# Patient Record
Sex: Male | Born: 1950
Health system: Southern US, Community
[De-identification: ages and names within clinical notes are randomized; demographics above are authoritative.]

## PROBLEM LIST (undated history)

## (undated) DIAGNOSIS — E119 Type 2 diabetes mellitus without complications: Secondary | ICD-10-CM

## (undated) DIAGNOSIS — I10 Essential (primary) hypertension: Secondary | ICD-10-CM

## (undated) DIAGNOSIS — J449 Chronic obstructive pulmonary disease, unspecified: Secondary | ICD-10-CM

## (undated) DIAGNOSIS — C3490 Malignant neoplasm of unspecified part of unspecified bronchus or lung: Secondary | ICD-10-CM

## (undated) DIAGNOSIS — F329 Major depressive disorder, single episode, unspecified: Secondary | ICD-10-CM

## (undated) DIAGNOSIS — R7881 Bacteremia: Secondary | ICD-10-CM

## (undated) DIAGNOSIS — F1010 Alcohol abuse, uncomplicated: Secondary | ICD-10-CM

## (undated) DIAGNOSIS — Z8679 Personal history of other diseases of the circulatory system: Secondary | ICD-10-CM

## (undated) DIAGNOSIS — E669 Obesity, unspecified: Secondary | ICD-10-CM

## (undated) DIAGNOSIS — N400 Enlarged prostate without lower urinary tract symptoms: Secondary | ICD-10-CM

## (undated) DIAGNOSIS — M1990 Unspecified osteoarthritis, unspecified site: Secondary | ICD-10-CM

## (undated) DIAGNOSIS — I4892 Unspecified atrial flutter: Secondary | ICD-10-CM

## (undated) DIAGNOSIS — F32A Depression, unspecified: Secondary | ICD-10-CM

## (undated) DIAGNOSIS — I509 Heart failure, unspecified: Secondary | ICD-10-CM

## (undated) DIAGNOSIS — Z87442 Personal history of urinary calculi: Secondary | ICD-10-CM

## (undated) DIAGNOSIS — I4891 Unspecified atrial fibrillation: Secondary | ICD-10-CM

## (undated) HISTORY — DX: Personal history of other diseases of the circulatory system: Z86.79

## (undated) HISTORY — DX: Type 2 diabetes mellitus without complications: E11.9

## (undated) HISTORY — DX: Malignant neoplasm of unspecified part of unspecified bronchus or lung: C34.90

## (undated) HISTORY — DX: Unspecified atrial flutter: I48.92

## (undated) HISTORY — DX: Chronic obstructive pulmonary disease, unspecified: J44.9

## (undated) HISTORY — PX: TONSILLECTOMY: SUR1361

## (undated) HISTORY — PX: OTHER SURGICAL HISTORY: SHX169

## (undated) HISTORY — DX: Essential (primary) hypertension: I10

## (undated) HISTORY — PX: APPENDECTOMY: SHX54

## (undated) HISTORY — DX: Bacteremia: R78.81

## (undated) HISTORY — DX: Alcohol abuse, uncomplicated: F10.10

---

## 2004-11-05 ENCOUNTER — Inpatient Hospital Stay (HOSPITAL_COMMUNITY): Admission: AD | Admit: 2004-11-05 | Discharge: 2004-11-06 | Payer: Self-pay | Admitting: Cardiovascular Disease

## 2004-11-05 ENCOUNTER — Ambulatory Visit: Payer: Self-pay | Admitting: Cardiovascular Disease

## 2007-08-10 ENCOUNTER — Ambulatory Visit: Payer: Self-pay | Admitting: Cardiology

## 2007-10-10 ENCOUNTER — Ambulatory Visit: Payer: Self-pay | Admitting: Cardiology

## 2008-09-09 ENCOUNTER — Ambulatory Visit (HOSPITAL_COMMUNITY): Admission: RE | Admit: 2008-09-09 | Discharge: 2008-09-09 | Payer: Self-pay | Admitting: Ophthalmology

## 2010-04-29 LAB — BASIC METABOLIC PANEL
BUN: 16 mg/dL (ref 6–23)
CO2: 26 mEq/L (ref 19–32)
Calcium: 9.4 mg/dL (ref 8.4–10.5)
Chloride: 105 mEq/L (ref 96–112)
Creatinine, Ser: 1.08 mg/dL (ref 0.4–1.5)
GFR calc Af Amer: >60 mL/min (ref >60)
GFR calc non Af Amer: >60 mL/min (ref >60)
Glucose, Bld: 113 mg/dL — ABNORMAL HIGH (ref 70–99)
Potassium: 4.5 mEq/L (ref 3.5–5.1)
Sodium: 137 mEq/L (ref 135–145)

## 2010-04-29 LAB — HEMOGLOBIN AND HEMATOCRIT, BLOOD
HCT: 48.7 % (ref 39.0–52.0)
Hemoglobin: 16.8 g/dL (ref 13.0–17.0)

## 2010-06-06 NOTE — Assessment & Plan Note (Signed)
[facility]                          [facility] OFFICE NOTE   Chase Herrera, [place]                          MRN:          [id number]  DATE:10/10/2007                            DOB:          July 11, 1950    PRIMARY CARDIOLOGIST:  Jonelle Sidle, MD   REASON FOR VISIT:  Post hospital followup.   Mr. Stroebel presents to our clinic, following recent hospitalization here  at [facility].  He was referred to [name] in consultation for evaluation of  supraventricular tachycardia.  He was initially seen by Dr. Lynden Ang, who found evidence of both atrial flutter as well as atrial  fibrillation.  His CHAD2 score was assessed as 1, secondary to  hypertension.  He was also noted to have a significant alcohol history  and, in fact, he informs me today that he stopped drinking altogether.  He was previously drinking as much as 1 pint a day.   Mr. Hamman was initially treated with adenosine, which slowed his rate  to a pattern consistent with flutter.  Dr. Marisue Humble did suggest  consideration for RF ablation of this rhythm, if he were to have any  recurrence.  However, the patient also was noted to have documented  atrial fibrillation.   Serial cardiac markers were also drawn and abnormal, with peak troponin  of 0.88.  The patient was referred for in-house evaluation with both a 2-  D echocardiogram and a stress test.  The echocardiogram showed mild LVD  (EF 45%-50%), and no significant valvular abnormalities.   The adenosine Cardiolite yielded no large reversible defects and no  significant EKG changes; EF 46%.   Clinically, Mr. Blissett reports 1 episode of tachy palpitations shortly  after discharge.  This spontaneously resolved and he has not had any  recurrent episodes.  He also states that his metoprolol was further up  titrated to the current dose of 100 mg daily.   Although he has since quit drinking, he does continue to smoke.  Of  note, Mr. Doescher  is unemployed and has no insurance.   Electrocardiogram today reveals NSR at 77 bpm with nonspecific ST  changes.   CURRENT MEDICATIONS:  1. Metoprolol 100 mg daily.  2. Diltiazem ER 240 daily.  3. Aspirin 325 daily.   PHYSICAL EXAMINATION:  VITAL SIGNS:  Blood pressure 192/100, pulse 72  and regular, weight 270.  GENERAL:  A 60 year old male, obese, sitting upright, in no distress.  HEENT:  Normocephalic and atraumatic.  NECK:  Palpable bilateral carotid pulses without bruits; unable to  assess JVD, secondary to neck girth.  LUNGS:  Diminished breath sounds in bases, with no crackles or wheezes.  HEART:  Regular rate and rhythm (S1S2), no significant murmurs.  ABDOMEN:  Markedly protuberant, nontender.  EXTREMITIES:  No significant pedal edema.  NEURO:  No focal deficit.   IMPRESSION:  1. Supraventricular tachycardia.      a.     Atrial flutter/fibrillation with spontaneous conversion to       NSR.      b.     CHAD2 score:  1.  2. Mild left ventricular dysfunction.      a.     EF 45%-50% by 2-D echo.  3. Alcohol abuse.      a.     Recently discontinued.  4. Ongoing tobacco.  5. Uncontrolled hypertension.  6. Morbid obesity.   PLAN:  1. Add lisinopril 5 mg daily for more aggressive blood pressure      control, as well as treatment for mild cardiomyopathy.  2. Followup BMET in 1 week.  3. Schedule return clinic followup with myself and Dr. Diona Browner in 6      months, particularly in light of the patient's severe financial      constraints.  He will, however, need to maintain close followup      with Dr. Fara Chute, with respect to ongoing monitoring and      management of hypertension.  4. Consider repeat 2-D echo in approximately 6 months, to see if there      has been any recovery in LV function.      Rozell Searing, PA-C  Electronically Signed      Jonelle Sidle, MD  Electronically Signed   GS/MedQ  DD: 10/10/2007  DT: 10/11/2007  Job #: [id number]   cc:    Fara Chute, MD

## 2010-06-09 NOTE — Discharge Summary (Signed)
NAME:  Chase Herrera, Chase Herrera NO.:  [id number]   MEDICAL RECORD NO.:  [id number]          PATIENT TYPE:  INP   LOCATION:  3703                         FACILITY:  [facility]   PHYSICIAN:  Charlies Constable, M.D. [facility] DATE OF BIRTH:  [birth year]   DATE OF ADMISSION:  11/05/2004  DATE OF DISCHARGE:                                 DISCHARGE SUMMARY   PRIMARY PHYSICIAN:  Dr. Neita Carp at [facility] [place].   PRINCIPAL DIAGNOSIS:  Shortness of breath and cough.   SECONDARY DIAGNOSES:  1.  Hypertension.  2.  Heavy tobacco, two packs per day.  3.  Alcohol abuse.  4.  History of bronchitis.  5.  Status post appendectomy.   ALLERGIES:  The patient has no known drug allergies.   PROCEDURES PERFORMED DURING THIS HOSPITALIZATION:  None.   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 The patient was admitted after he was chopping  wood at home and started to develop shortness of breath and cough over the  last few days with a productive cough. The patient also stated he was unable  to catch his breath so was presenting to the ED. The patient was admitted to  rule out MI and serial CK-MBs and troponins were negative. The patient also  was noted to have bronchitis during this admission and was started on a Z-  Pak. The patient did complain of continued shortness of breath and was  started on albuterol and Atrovent nebulizers, and guaifenesin for cough  suppression. The patient did not have any history during this  hospitalization of any withdrawal due to alcohol. We did encourage  moderation with his alcohol drinking.   #2 - HYPERTENSION. We increased his metoprolol and lisinopril and continued  his hydrochlorothiazide at that point. Dr. Juanda Chance saw the patient on November 06, 2004.  Assessment was most likely shortness of breath was related to  acute bronchitis and positive cigarette abuse.   DISCHARGE LABORATORY DATA:  Include a troponin x2 which are negative, CK-MBs  7.8 down to 7.1 respectively. Lipid studies showed a total cholesterol of  150, triglycerides of 153, an HDL of 52, an LDL of 67, with a  cholesterol/HDL ratio of 2.9. Thyroid studies were normal. BUN is 15,  creatinine 1.1. White blood count of 6.6, hemoglobin of 15.4, hematocrit of  44.6, platelet count of 171.   The patient will be discharged home with the following medications:  1.  Lipitor 20 mg p.o. q.h.s.  2.  Hydrochlorothiazide 25 mg p.o. daily.  3.  Lisinopril 20 mg p.o. b.i.d.  4.  Aspirin 325 mg p.o. daily.  5.  Lopressor 50 mg p.o. t.i.d.   With the addition of:  1.  Albuterol 2.5 mg two puffs q.4-6h. as needed.  2.  Atrovent inhaler 18 mcg two puffs q.4-6h. p.r.n.  3.  Azithromycin 250 mg q.h.s. x4 days.   The  patient was also instructed to eat a heart-healthy diet and smoking  cessation counseling was given. The patient is to follow up with Dr. Myrtis Ser in  the [facility] office on Monday, November 10, 2004, at 1:30 p.m. Also to follow up  with his primary care physician, Dr. Neita Carp. The patient also is to get his  hypercoagulation for lupus blood test done as an outpatient.   Duration of encounter was less than 30 minutes.     ______________________________  April Humphrey, NP    ______________________________  Charlies Constable, M.D. [facility]    AH/MEDQ  D:  11/06/2004  T:  11/06/2004  Job:  [id number]   cc:   Fara Chute  Fax: [phone]   Willa Rough, M.D.  [address]  [address]  [place]  [place] [address]

## 2010-06-09 NOTE — H&P (Signed)
NAME:  Chase Herrera, Chase Herrera NO.:  [id number]   MEDICAL RECORD NO.:  [id number]          PATIENT TYPE:  INP   LOCATION:  3703                         FACILITY:  [facility]   PHYSICIAN:  Verne Grain, MD   DATE OF BIRTH:  December 02, 1950   DATE OF ADMISSION:  11/06/2004  DATE OF DISCHARGE:                                HISTORY & PHYSICAL   PRIMARY CARDIOLOGIST:  None.   PRIMARY CARE PHYSICIAN:  Dr. Fara Chute at [facility] in [place].   CHIEF COMPLAINT:  Cough and accompanying shortness of breath with paroxysms  of cough with low-positive CK-MB, transferred from [facility] Emergency Room  for further evaluation.   HISTORY OF PRESENT ILLNESS:  [age]-year-old male with hypertension,  heavy tobacco (2 packs per day), alcohol, mild-to-moderately overweight,  works as a Press photographer, no regular exercise regimen, but does do  work in the home environment, reports chopping wood a few days ago with no  specific complaints, no chest pain, no shortness of breath; however, over  the past couple of days, the patient has had increased cough with scant  white sputum, cough paroxysms that have led to sensation of accompanying  shortness of breath, I get coughing so much I can't hardly catch my  breath.  No shortness of breath in the absence of cough.  No difficulties  with orthopnea, PND, or other positional dyspnea.  No fevers or chills.  No  chest pain at any time.  EKG at [facility] with no findings diagnostic of  ischemia.  Troponin at [facility] 0.03 (upper limit of normal at [facility]), CK  226 with MB fraction 11.4 (5%), transferred to [facility] for  further evaluation.   ALLERGIES/ADVERSE REACTIONS:  No known drug allergies.   HOME MEDICATIONS:  Lopressor, hydrochlorothiazide, lisinopril (doses  unknown).   PAST MEDICAL HISTORY:  1.  Hypertension.  2.  Heavy tobacco use (2 packs per day).  3.  Daily alcohol (half pint per day on most days, but not  every day, no      history of withdrawal or symptoms in the absence of alcohol      consumption).  4.  History of bronchitis/history of pneumonia.  5.  Status post appendectomy.   SOCIAL HISTORY:  The patient lives in [place] with his mother.  He has never  married.  He works as a Press photographer.  He has no children.  He  smokes 2 packs of cigarettes per day and a half pint of alcohol on most  days.  He has had no alcohol in the past 2 days.  He has no history of  withdrawal or withdrawal symptoms as stated above.  He has no regular  exercise regimen, although does participate in activities required in the  home as described in the HPI.   FAMILY HISTORY:  The patient's mother is alive at age 49; she is obese with  hypertension and possibly has a thrombophilia for which she takes chronic  Coumadin.  The patient personally has had no history of blood clots or any  known  blood-clotting disorder.  The patient's father is alive at age 49 with  multiple medical problems including alcoholism and heart trouble that  started in his 62s.  The patient has 4 sisters who are healthy, 1 half  brother who has an unknown medical status.   REVIEW OF SYSTEMS:  No recent fevers, chills or sweats.  No headache.  No  acute changes in auditory or visual acuity.  No acute rash.  The patient  does report some shortness of breath, but only with cough.  No dyspnea on  exertion, orthopnea or PND and specifically denied chest pain.  No bowel or  bladder complaints.  No acute neuropsychiatric complaints.  No heat or cold  intolerance.  No skin or hair changes or gastroenterologic abnormality.  All  other systems are negative.   PHYSICAL EXAMINATION:  VITAL SIGNS:  Temperature 98.4, heart rate 81,  respiratory rate 20, blood pressure 180/95, oxygen saturation 90% on 2 L,  weight 264 pounds, height 6 feet 1 inch.  GENERAL:  The patient is pleasant, cooperative, in no apparent distress.  He  answers  questions appropriately.  HEENT:  He is normocephalic, atraumatic.  Extraocular eye movements are  intact.  Oropharynx is pink and moist without lesions, somewhat suboptimal  dentition.  NECK:  Supple.  There is no jugular venous distention.  There are no carotid  bruits.  There is no palpable lymphadenopathy or thyromegaly.  CARDIOVASCULAR:  Regular S1 and S2 with no appreciable murmur.  LUNGS:  Lung fields reveals bilateral scattered rhonchi.  SKIN:  Examination reveals no acute rash.  ABDOMEN:  Soft, nontender and non-distended.  Positive bowel sounds.  Obese.  EXTREMITIES:  Examination reveal no evidence of edema.  NEUROLOGIC:  Limited neurologic exam is grossly nonfocal.  The patient is  alert and answers questions appropriately.  He moves all 4 extremities  without difficulty, although gait was not tested.   ACCESSORY CLINICAL DATA:  EKG:  Sinus rhythm at a rate of 90 with an axis  that is near normal.  P-R, QRS and QTc intervals are normal.  There is a Q  wave only in V1.  There are no clear changes diagnostic of ischemia other  than poor R wave progression.   LABORATORY VALUES:  White blood cell count 9.2, hematocrit 48, platelet  count 183,000.  Sodium 136, potassium 3.8, chloride 104, bicarb 25, BUN 17,  creatinine 1.0.  Anion gap 7.  Glucose 100, calcium 8.8.  CK 226, MB 11.4  (5%).  PTT 25, INR 1.0, troponin 0.03.  BNP 57.   IMPRESSION AND PLAN:  [age]-year-old male with suboptimally controlled  blood pressure, overweight, heavy tobacco/alcohol with signs and symptoms of  bronchitis, EKG with poor R wave progression, but otherwise no changes  diagnostic of ischemia, low-positive MB, troponin at upper limits of normal,  transferred from [facility] Room for further evaluation.   1.  Cardiac:  We will obtain serial CK-MBs and troponin, on telemetry, rule      out myocardial infarction, on heparin drip.  We will recheck an EKG in     the morning to look  for any evidence of ischemic changes.  I will      optimize the patient's respiratory treatment and blood pressure      treatment while excluding the presence of ischemia.  If all cardiac      markers are negative or in an equivocal range, it would be reasonable to  proceed with an adenosine stress Cardiolite to exclude the presence of      significant ischemia.  We will also encourage tobacco cessation      throughout the hospitalization and check a lipid profile to assure that      it is in reasonable range.  Encourage weight loss and regular exercise      after discharge.  2.  Bronchitis/tobacco:  Treat empirically with azithromycin, supplemental      oxygen, albuterol and Atrovent nebulizers, guaifenesin.  We will check a      chest x-ray, PA and lateral, to exclude any infiltrate.  Continue to      encourage tobacco cessation; will formally write for tobacco cessation      consult.  3.  Alcohol:  No history of withdrawal; however, we will monitor for      evidence of withdrawal during the hospitalization.  We will encourage      alcohol use in moderation or continuation of cessation.  4.  Hypertension:  We will try to optimize metoprolol and lisinopril doses      and continue hydrochlorothiazide in      hopes of optimizing blood pressure if failure to control blood pressure      on optimal doses of 3 medications.  To consider evaluation for secondary      causes of hypertension such as MRI/MRA to look for any evidence of renal      artery stenosis.           ______________________________  Verne Grain, MD     DDH/MEDQ  D:  11/06/2004  T:  11/06/2004  Job:  [id number]

## 2011-02-06 DIAGNOSIS — F411 Generalized anxiety disorder: Secondary | ICD-10-CM | POA: Diagnosis not present

## 2011-02-06 DIAGNOSIS — I10 Essential (primary) hypertension: Secondary | ICD-10-CM | POA: Diagnosis not present

## 2011-02-06 DIAGNOSIS — F17200 Nicotine dependence, unspecified, uncomplicated: Secondary | ICD-10-CM | POA: Diagnosis not present

## 2011-02-06 DIAGNOSIS — J438 Other emphysema: Secondary | ICD-10-CM | POA: Diagnosis not present

## 2011-02-06 DIAGNOSIS — Z0000 Encounter for general adult medical examination without abnormal findings: Secondary | ICD-10-CM | POA: Diagnosis not present

## 2011-02-06 DIAGNOSIS — E119 Type 2 diabetes mellitus without complications: Secondary | ICD-10-CM | POA: Diagnosis not present

## 2011-05-29 DIAGNOSIS — R079 Chest pain, unspecified: Secondary | ICD-10-CM | POA: Diagnosis not present

## 2011-05-29 DIAGNOSIS — I10 Essential (primary) hypertension: Secondary | ICD-10-CM | POA: Diagnosis not present

## 2011-05-29 DIAGNOSIS — I4891 Unspecified atrial fibrillation: Secondary | ICD-10-CM | POA: Diagnosis not present

## 2011-05-30 ENCOUNTER — Telehealth: Payer: Self-pay | Admitting: Physician Assistant

## 2011-05-30 DIAGNOSIS — I10 Essential (primary) hypertension: Secondary | ICD-10-CM | POA: Diagnosis not present

## 2011-05-30 DIAGNOSIS — R079 Chest pain, unspecified: Secondary | ICD-10-CM

## 2011-05-30 DIAGNOSIS — I4891 Unspecified atrial fibrillation: Secondary | ICD-10-CM

## 2011-05-30 NOTE — Telephone Encounter (Signed)
Please set up patient for a 21 day monitor for Afib/PAS per Gene.  Scheduled post hospital visit for 06/29/2011 with Gene.

## 2011-05-31 ENCOUNTER — Telehealth: Payer: Self-pay | Admitting: *Deleted

## 2011-05-31 NOTE — Telephone Encounter (Signed)
Left message for patient to call office r/e event monitor. Need to verify insurance information.

## 2011-05-31 NOTE — Telephone Encounter (Signed)
Left message for patient to call office r/e verification of insurance.

## 2011-05-31 NOTE — Telephone Encounter (Addendum)
Patient informed after walking into office and insurance verified with patient.

## 2011-05-31 NOTE — Telephone Encounter (Signed)
Called and spoke with next of kin in chart max for patient and informed her to have patient call our office.

## 2011-05-31 NOTE — Telephone Encounter (Signed)
Insurance verified and monitor ordered.

## 2011-06-05 DIAGNOSIS — I4891 Unspecified atrial fibrillation: Secondary | ICD-10-CM | POA: Diagnosis not present

## 2011-06-07 DIAGNOSIS — I10 Essential (primary) hypertension: Secondary | ICD-10-CM | POA: Diagnosis not present

## 2011-06-07 DIAGNOSIS — N400 Enlarged prostate without lower urinary tract symptoms: Secondary | ICD-10-CM | POA: Diagnosis not present

## 2011-06-07 DIAGNOSIS — E119 Type 2 diabetes mellitus without complications: Secondary | ICD-10-CM | POA: Diagnosis not present

## 2011-06-29 ENCOUNTER — Ambulatory Visit (INDEPENDENT_AMBULATORY_CARE_PROVIDER_SITE_OTHER): Payer: Medicare Other | Admitting: Physician Assistant

## 2011-06-29 ENCOUNTER — Encounter: Payer: Self-pay | Admitting: Physician Assistant

## 2011-06-29 VITALS — BP 183/92 | HR 59 | Resp 18 | Ht 73.0 in | Wt 248.0 lb

## 2011-06-29 DIAGNOSIS — R079 Chest pain, unspecified: Secondary | ICD-10-CM | POA: Diagnosis not present

## 2011-06-29 DIAGNOSIS — F1010 Alcohol abuse, uncomplicated: Secondary | ICD-10-CM

## 2011-06-29 DIAGNOSIS — E119 Type 2 diabetes mellitus without complications: Secondary | ICD-10-CM | POA: Diagnosis not present

## 2011-06-29 DIAGNOSIS — I10 Essential (primary) hypertension: Secondary | ICD-10-CM

## 2011-06-29 DIAGNOSIS — I4892 Unspecified atrial flutter: Secondary | ICD-10-CM | POA: Diagnosis not present

## 2011-06-29 MED ORDER — METOPROLOL TARTRATE 100 MG PO TABS: 100.0000 mg | ORAL_TABLET | Freq: Two times a day (BID) | ORAL | Status: DC

## 2011-06-29 MED ORDER — ASPIRIN EC 325 MG PO TBEC: 325.0000 mg | DELAYED_RELEASE_TABLET | Freq: Every day | ORAL | Status: AC

## 2011-06-29 NOTE — Assessment & Plan Note (Signed)
Followed by primary M.D. 

## 2011-06-29 NOTE — Progress Notes (Signed)
HPI: Patient presents in post hospital followup from [facility], referred to [name] in consultation for evaluation of atypical CP. He ruled out for MI, but had reported PAF RVR in the field, per EMS. On arrival to ED, however, he was in ST, wherein he remained by time of discharge.  Patient had history of PAF in 2009, and was seen here in clinic in followup, by Dr. Diona Browner. He was assessed with a CHADS score of 1 at that time (HTN), and was treated with beta blocker, calcium channel blocker, and full dose aspirin. During this admission, we assessed him with a CHADS of 2 (HTN, DM). TSH was normal. We proceeded with a dobutamine stress echocardiogram, for risk stratification, and this was normal (EF 55-60%). Of note, patient had history of mild LVD (EF 45-50%), by prior echo in 2009. He has no known CAD.  We also ordered an outpatient event monitor, and this has revealed intermittent atrial flutter with variable conduction, with rates of 110-115 bpm.  Clinically, patient essentially reports no significant associated symptoms, during his episodes of documented atrial flutter. He denies any frank tachycardia palpitations. Unfortunately, he continues to drink EtOH. He also drinks 1-2 cups of regular coffee daily.  EKG today indicates atrial flutter with 3:1 conduction at approximately 125 bpm  No Known Allergies  Current Outpatient Prescriptions  Medication Sig Dispense Refill  . albuterol (PROVENTIL HFA;VENTOLIN HFA) 108 (90 BASE) MCG/ACT inhaler Inhale 2 puffs into the lungs every 6 (six) hours as needed.      . budesonide-formoterol (SYMBICORT) 160-4.5 MCG/ACT inhaler Inhale 2 puffs into the lungs 2 (two) times daily.      . citalopram (CELEXA) 10 MG tablet Take 5 mg by mouth daily.      Marland Kitchen diltiazem (CARDIZEM) 120 MG tablet Take 120 mg by mouth 2 (two) times daily.      Marland Kitchen lisinopril (PRINIVIL,ZESTRIL) 5 MG tablet Take 5 mg by mouth daily.      . metFORMIN (GLUCOPHAGE) 500 MG tablet Take 500 mg by mouth 3  (three) times daily.      . metoprolol (LOPRESSOR) 100 MG tablet Take 1 tablet (100 mg total) by mouth 2 (two) times daily.  60 tablet  6  . DISCONTD: metoprolol (LOPRESSOR) 100 MG tablet Take 100 mg by mouth daily.      Marland Kitchen DISCONTD: metoprolol (LOPRESSOR) 100 MG tablet Take 1 tablet (100 mg total) by mouth 2 (two) times daily.  60 tablet  6  . DISCONTD: metoprolol (LOPRESSOR) 100 MG tablet Take 1 tablet (100 mg total) by mouth 2 (two) times daily.  60 tablet  6  . aspirin EC 325 MG tablet Take 1 tablet (325 mg total) by mouth daily.        Past Medical History  Diagnosis Date  . Chest pain   . Atrial flutter   . Alcohol abuse   . HTN (hypertension)   . Diabetes mellitus   . Cardiomyopathy     EF 45-50%, 2-D echo, 2009; 55-60%, by dobutamine stress echocardiogram, 5/13    No past surgical history on file.  History   Social History  . Marital Status: Single    Spouse Name: N/A    Number of Children: N/A  . Years of Education: N/A   Occupational History  . Not on file.   Social History Main Topics  . Smoking status: Current Everyday Smoker  . Smokeless tobacco: Not on file  . Alcohol Use: Not on file  . Drug Use: Not  on file  . Sexually Active: Not on file   Other Topics Concern  . Not on file   Social History Narrative  . No narrative on file    No family history on file.  ROS: no nausea, vomiting; no fever, chills; no melena, hematochezia; no claudication  PHYSICAL EXAM: BP 183/92  Pulse 59  Resp 18  Ht 6\' 1"  (1.854 m)  Wt 248 lb (112.492 kg)  BMI 32.72 kg/m2 GENERAL: 61 year old male, obese, sitting upright; NAD HEENT: NCAT, PERRLA, EOMI; sclera clear; no xanthelasma NECK: palpable bilateral carotid pulses, no bruits; no JVD; no TM LUNGS: CTA bilaterally CARDIAC: RRR (S1, S2); no significant murmurs; no rubs or gallops ABDOMEN: soft, non-tender; intact BS EXTREMETIES: intact distal pulses; no significant peripheral edema SKIN: warm/dry; no obvious  rash/lesions MUSCULOSKELETAL: no joint deformity NEURO: no focal deficit; NL affect   EKG: reviewed and available in Electronic Records   ASSESSMENT & PLAN:  Atrial flutter Treatment options of continued medical therapy versus possible RFA were discussed with the patient. For now, he wants to continue medical therapy, but will confer with his family. We also discussed the issue of continued EtOH abuse, and how this precludes him from being treated with Coumadin anticoagulation. He appears committed to try to stop. In the interim, he is to continue on full dose aspirin. We will reassess clinical status in 4-6 weeks, at which time he is to return to followup with me/Dr. Diona Browner for further recommendations. As noted, he has a slow CHADS score of 1. Will increase Lopressor to 100 twice a day for more aggressive rate, as well as possible rhythm, control.  HTN (hypertension) Increased Lopressor to 100 twice a day, as noted above. Continue current dose of diltiazem and lisinopril.  Chest pain No further workup indicated. Recent hospitalization with atypical CP, normal troponins, and a normal dobutamine stress echocardiogram. Patient has no known CAD.  Diabetes mellitus Followed by primary M.D.  ETOH abuse We discussed the importance of complete cessation, and patient appears motivated to stop. He also is aware that this precludes him from being treated with Coumadin anticoagulation, given that it places him at increased risk.     Gene Mccoy Testa, PAC

## 2011-06-29 NOTE — Assessment & Plan Note (Signed)
Treatment options of continued medical therapy versus possible RFA were discussed with the patient. For now, he wants to continue medical therapy, but will confer with his family. We also discussed the issue of continued EtOH abuse, and how this precludes him from being treated with Coumadin anticoagulation. He appears committed to try to stop. In the interim, he is to continue on full dose aspirin. We will reassess clinical status in 4-6 weeks, at which time he is to return to followup with me/Dr. Diona Browner for further recommendations. As noted, he has a slow CHADS score of 1. Will increase Lopressor to 100 twice a day for more aggressive rate, as well as possible rhythm, control.

## 2011-06-29 NOTE — Assessment & Plan Note (Signed)
We discussed the importance of complete cessation, and patient appears motivated to stop. He also is aware that this precludes him from being treated with Coumadin anticoagulation, given that it places him at increased risk.

## 2011-06-29 NOTE — Assessment & Plan Note (Signed)
No further workup indicated. Recent hospitalization with atypical CP, normal troponins, and a normal dobutamine stress echocardiogram. Patient has no known CAD.

## 2011-06-29 NOTE — Patient Instructions (Signed)
   Increase Lopressor to 100mg  twice a day    Aspirin 325mg  daily Follow up in  4-6 weeks

## 2011-06-29 NOTE — Assessment & Plan Note (Signed)
Increased Lopressor to 100 twice a day, as noted above. Continue current dose of diltiazem and lisinopril.

## 2011-07-24 DIAGNOSIS — R0602 Shortness of breath: Secondary | ICD-10-CM | POA: Diagnosis not present

## 2011-08-02 DIAGNOSIS — J438 Other emphysema: Secondary | ICD-10-CM | POA: Diagnosis not present

## 2011-08-02 DIAGNOSIS — F17200 Nicotine dependence, unspecified, uncomplicated: Secondary | ICD-10-CM | POA: Diagnosis not present

## 2011-08-02 DIAGNOSIS — F411 Generalized anxiety disorder: Secondary | ICD-10-CM | POA: Diagnosis not present

## 2011-08-02 DIAGNOSIS — I10 Essential (primary) hypertension: Secondary | ICD-10-CM | POA: Diagnosis not present

## 2011-08-02 DIAGNOSIS — E119 Type 2 diabetes mellitus without complications: Secondary | ICD-10-CM | POA: Diagnosis not present

## 2011-08-02 DIAGNOSIS — IMO0002 Reserved for concepts with insufficient information to code with codable children: Secondary | ICD-10-CM | POA: Diagnosis not present

## 2011-08-10 ENCOUNTER — Ambulatory Visit: Payer: Medicare Other | Admitting: Physician Assistant

## 2011-08-23 ENCOUNTER — Emergency Department (HOSPITAL_COMMUNITY)
Admission: EM | Admit: 2011-08-23 | Discharge: 2011-08-24 | Disposition: A | Discharge status: Rehabilitation Facility | Payer: Medicare Other | Attending: Emergency Medicine | Admitting: Emergency Medicine

## 2011-08-23 ENCOUNTER — Encounter (HOSPITAL_COMMUNITY): Payer: Self-pay | Admitting: *Deleted

## 2011-08-23 DIAGNOSIS — I428 Other cardiomyopathies: Secondary | ICD-10-CM | POA: Insufficient documentation

## 2011-08-23 DIAGNOSIS — I10 Essential (primary) hypertension: Secondary | ICD-10-CM | POA: Insufficient documentation

## 2011-08-23 DIAGNOSIS — E119 Type 2 diabetes mellitus without complications: Secondary | ICD-10-CM | POA: Insufficient documentation

## 2011-08-23 DIAGNOSIS — F1010 Alcohol abuse, uncomplicated: Secondary | ICD-10-CM

## 2011-08-23 DIAGNOSIS — Z79899 Other long term (current) drug therapy: Secondary | ICD-10-CM | POA: Insufficient documentation

## 2011-08-23 DIAGNOSIS — Z9089 Acquired absence of other organs: Secondary | ICD-10-CM | POA: Insufficient documentation

## 2011-08-23 DIAGNOSIS — F17200 Nicotine dependence, unspecified, uncomplicated: Secondary | ICD-10-CM | POA: Insufficient documentation

## 2011-08-23 LAB — RAPID URINE DRUG SCREEN, HOSP PERFORMED
Amphetamines: NOT DETECTED
Barbiturates: NOT DETECTED
Benzodiazepines: NOT DETECTED
Cocaine: NOT DETECTED
Opiates: NOT DETECTED
Tetrahydrocannabinol: NOT DETECTED

## 2011-08-23 LAB — COMPREHENSIVE METABOLIC PANEL
ALT: 43 U/L (ref 0–53)
AST: 36 U/L (ref 0–37)
Albumin: 3.7 g/dL (ref 3.5–5.2)
Alkaline Phosphatase: 56 U/L (ref 39–117)
BUN: 17 mg/dL (ref 6–23)
CO2: 25 mEq/L (ref 19–32)
Calcium: 9.4 mg/dL (ref 8.4–10.5)
Chloride: 97 mEq/L (ref 96–112)
Creatinine, Ser: 1.53 mg/dL — ABNORMAL HIGH (ref 0.50–1.35)
GFR calc Af Amer: 56 mL/min — ABNORMAL LOW (ref >90)
GFR calc non Af Amer: 48 mL/min — ABNORMAL LOW (ref >90)
Glucose, Bld: 98 mg/dL (ref 70–99)
Potassium: 3.6 mEq/L (ref 3.5–5.1)
Sodium: 135 mEq/L (ref 135–145)
Total Bilirubin: 0.2 mg/dL — ABNORMAL LOW (ref 0.3–1.2)
Total Protein: 6.8 g/dL (ref 6.0–8.3)

## 2011-08-23 LAB — CBC WITH DIFFERENTIAL/PLATELET
Basophils Absolute: 0 10*3/uL (ref 0.0–0.1)
Basophils Relative: 0 % (ref 0–1)
Eosinophils Absolute: 0.1 10*3/uL (ref 0.0–0.7)
Eosinophils Relative: 2 % (ref 0–5)
HCT: 48.6 % (ref 39.0–52.0)
Hemoglobin: 16.4 g/dL (ref 13.0–17.0)
Lymphocytes Relative: 39 % (ref 12–46)
Lymphs Abs: 2.8 10*3/uL (ref 0.7–4.0)
MCH: 35 pg — ABNORMAL HIGH (ref 26.0–34.0)
MCHC: 33.7 g/dL (ref 30.0–36.0)
MCV: 103.6 fL — ABNORMAL HIGH (ref 78.0–100.0)
Monocytes Absolute: 0.7 10*3/uL (ref 0.1–1.0)
Monocytes Relative: 9 % (ref 3–12)
Neutro Abs: 3.6 10*3/uL (ref 1.7–7.7)
Neutrophils Relative %: 50 % (ref 43–77)
Platelets: 191 10*3/uL (ref 150–400)
RBC: 4.69 MIL/uL (ref 4.22–5.81)
RDW: 13.3 % (ref 11.5–15.5)
WBC: 7.1 10*3/uL (ref 4.0–10.5)

## 2011-08-23 LAB — ETHANOL: Alcohol, Ethyl (B): 127 mg/dL — ABNORMAL HIGH (ref 0–11)

## 2011-08-23 MED ORDER — METOPROLOL TARTRATE 50 MG PO TABS
100.0000 mg | ORAL_TABLET | Freq: Two times a day (BID) | ORAL | Status: DC
  Administered 2011-08-23 – 2011-08-24 (×2): 100 mg via ORAL
  Filled 2011-08-23 (×3): qty 2

## 2011-08-23 MED ORDER — LISINOPRIL 5 MG PO TABS
5.0000 mg | ORAL_TABLET | Freq: Every day | ORAL | Status: DC
  Administered 2011-08-24: 5 mg via ORAL
  Filled 2011-08-23 (×4): qty 1

## 2011-08-23 MED ORDER — VITAMIN B-1 100 MG PO TABS
100.0000 mg | ORAL_TABLET | Freq: Every day | ORAL | Status: DC
  Administered 2011-08-23 – 2011-08-24 (×2): 100 mg via ORAL
  Filled 2011-08-23 (×2): qty 1

## 2011-08-23 MED ORDER — LORAZEPAM 2 MG/ML IJ SOLN: 1.0000 mg | Freq: Four times a day (QID) | INTRAMUSCULAR | Status: DC | PRN

## 2011-08-23 MED ORDER — BUDESONIDE-FORMOTEROL FUMARATE 160-4.5 MCG/ACT IN AERO
INHALATION_SPRAY | RESPIRATORY_TRACT | Status: AC
  Filled 2011-08-23: qty 6

## 2011-08-23 MED ORDER — ALBUTEROL SULFATE HFA 108 (90 BASE) MCG/ACT IN AERS: 2.0000 | INHALATION_SPRAY | RESPIRATORY_TRACT | Status: DC | PRN

## 2011-08-23 MED ORDER — ADULT MULTIVITAMIN W/MINERALS CH
1.0000 | ORAL_TABLET | Freq: Every day | ORAL | Status: DC
  Administered 2011-08-23 – 2011-08-24 (×2): 1 via ORAL
  Filled 2011-08-23 (×2): qty 1

## 2011-08-23 MED ORDER — DILTIAZEM HCL 30 MG PO TABS
120.0000 mg | ORAL_TABLET | Freq: Two times a day (BID) | ORAL | Status: DC
  Administered 2011-08-23 – 2011-08-24 (×2): 120 mg via ORAL
  Filled 2011-08-23: qty 3
  Filled 2011-08-23: qty 1
  Filled 2011-08-23 (×2): qty 4

## 2011-08-23 MED ORDER — LISINOPRIL 10 MG PO TABS
ORAL_TABLET | ORAL | Status: AC
  Filled 2011-08-23: qty 1

## 2011-08-23 MED ORDER — BUDESONIDE-FORMOTEROL FUMARATE 160-4.5 MCG/ACT IN AERO
2.0000 | INHALATION_SPRAY | Freq: Two times a day (BID) | RESPIRATORY_TRACT | Status: DC
  Administered 2011-08-23 – 2011-08-24 (×2): 2 via RESPIRATORY_TRACT
  Filled 2011-08-23 (×3): qty 6

## 2011-08-23 MED ORDER — LORAZEPAM 1 MG PO TABS
1.0000 mg | ORAL_TABLET | Freq: Four times a day (QID) | ORAL | Status: DC | PRN
  Administered 2011-08-23 – 2011-08-24 (×2): 1 mg via ORAL
  Filled 2011-08-23 (×2): qty 1

## 2011-08-23 MED ORDER — FOLIC ACID 1 MG PO TABS
1.0000 mg | ORAL_TABLET | Freq: Every day | ORAL | Status: DC
  Administered 2011-08-23 – 2011-08-24 (×2): 1 mg via ORAL
  Filled 2011-08-23 (×2): qty 1

## 2011-08-23 MED ORDER — CITALOPRAM HYDROBROMIDE 10 MG PO TABS
5.0000 mg | ORAL_TABLET | Freq: Every day | ORAL | Status: DC
  Administered 2011-08-23: 21:00:00 via ORAL
  Administered 2011-08-24: 5 mg via ORAL
  Filled 2011-08-23 (×4): qty 1

## 2011-08-23 MED ORDER — ALBUTEROL SULFATE HFA 108 (90 BASE) MCG/ACT IN AERS
2.0000 | INHALATION_SPRAY | RESPIRATORY_TRACT | Status: DC
  Administered 2011-08-23 – 2011-08-24 (×4): 2 via RESPIRATORY_TRACT
  Filled 2011-08-23: qty 6.7

## 2011-08-23 MED ORDER — METFORMIN HCL 500 MG PO TABS
500.0000 mg | ORAL_TABLET | Freq: Three times a day (TID) | ORAL | Status: DC
  Administered 2011-08-23 – 2011-08-24 (×2): 500 mg via ORAL
  Filled 2011-08-23 (×3): qty 1

## 2011-08-23 MED ORDER — THIAMINE HCL 100 MG/ML IJ SOLN: 100.0000 mg | Freq: Every day | INTRAMUSCULAR | Status: DC

## 2011-08-23 NOTE — ED Notes (Signed)
Chase Herrera. Of ACT team at bedside discussing plan of care with pt.

## 2011-08-23 NOTE — ED Notes (Signed)
Pt accepted at [facility] on [address]; [place], as voluntary pt  Per Dr Robet Leu as admitting MD  After 7 am 08/24/2011. Report to be called to [phone]. Pt will be transported via care link. Family to be notified by said pt.

## 2011-08-23 NOTE — ED Notes (Signed)
Requesting etoh detox, last drank etoh today.

## 2011-08-23 NOTE — BH Assessment (Signed)
Assessment Note   Chase Herrera is an 61 y.o. male.  Pt reports he is seeking detox. He reports trying to stop on his own but has been unsuccessful. He reports his mother died 1 year ago and left him lifetime rights to the house. He has 4 sisters and no other sibling drinks but him. He considers himself and the bad one in the family and wants to stop drinking. Pt denies s/i, h/i, and is not psychotic nor delusional. Pt reports family and very supportive with one sister currently at bedside. He is A & O x 3 and reports being compliant with his medications.       Axis I: Substance Abuse ALCOHOL DEPENDENCY Axis II: Deferred Axis III:  Past Medical History  Diagnosis Date  . Chest pain   . Atrial flutter   . Alcohol abuse   . HTN (hypertension)   . Diabetes mellitus   . Cardiomyopathy     EF 45-50%, 2-D echo, 2009; 55-60%, by dobutamine stress echocardiogram, 5/13   Axis IV: problems related to social environment Axis V: 51-60 moderate symptoms   Past Medical History:  Past Medical History  Diagnosis Date  . Chest pain   . Atrial flutter   . Alcohol abuse   . HTN (hypertension)   . Diabetes mellitus   . Cardiomyopathy     EF 45-50%, 2-D echo, 2009; 55-60%, by dobutamine stress echocardiogram, 5/13    Past Surgical History  Procedure Date  . Tonsillectomy   . Appendectomy     Family History: History reviewed. No pertinent family history.  Social History:  reports that he has been smoking.  He does not have any smokeless tobacco history on file. He reports that he drinks alcohol. He reports that he does not use illicit drugs.  Additional Social History:  Alcohol / Drug Use Pain Medications: na Prescriptions: na Over the Counter: na History of alcohol / drug use?: Yes Substance #1 Name of Substance 1: alcohol 1 - Age of First Use: 22 1 - Amount (size/oz): 1 pt  1 - Frequency: daily 1 - Duration: years 1 - Last Use / Amount: 08/23/11  CIWA: CIWA-Ar BP: 103/75  mmHg Pulse Rate: 108  Nausea and Vomiting: mild nausea with no vomiting Tactile Disturbances: none Tremor: not visible, but can be felt fingertip to fingertip Auditory Disturbances: not present Paroxysmal Sweats: barely perceptible sweating, palms moist Visual Disturbances: very mild sensitivity Anxiety: mildly anxious Headache, Fullness in Head: very mild Agitation: somewhat more than normal activity Orientation and Clouding of Sensorium: oriented and can do serial additions CIWA-Ar Total: 7  COWS:    Allergies: No Known Allergies  Home Medications:  (Not in a hospital admission)  OB/GYN Status:  No LMP for male patient.  General Assessment Data Location of Assessment: [facility] ACT Assessment: Yes Living Arrangements: Alone Can pt return to current living arrangement?: Yes Admission Status: Voluntary Is patient capable of signing voluntary admission?: Yes Transfer from: [facility] er) Referral Source: MD (DR Gabriel Rung ZAMMIT)  Education Status Contact person: KATHY BRIDGES-[phone]  Risk to self Suicidal Ideation: No Suicidal Intent: No Is patient at risk for suicide?: No Suicidal Plan?: No Access to Means: No What has been your use of drugs/alcohol within the last 12 months?: ALCOHOL Previous Attempts/Gestures: No How many times?: 0  Other Self Harm Risks: NA Triggers for Past Attempts: None known Intentional Self Injurious Behavior: None Family Suicide History: No Recent stressful life event(s): Loss (Comment) (MOTHER  DIED 1 YEAR AGO) Persecutory voices/beliefs?: No Depression: No Substance abuse history and/or treatment for substance abuse?: Yes Suicide prevention information given to non-admitted patients: Not applicable  Risk to Others Homicidal Ideation: No Thoughts of Harm to Others: No Current Homicidal Intent: No Current Homicidal Plan: No Access to Homicidal Means: No History of harm to others?: No Assessment of Violence: None  Noted Violent Behavior Description: NA Does patient have access to weapons?: No Criminal Charges Pending?: No Does patient have a court date: No  Psychosis Hallucinations: None noted Delusions: None noted  Mental Status Report Appear/Hygiene: Improved Eye Contact: Good Motor Activity: Freedom of movement Speech: Logical/coherent Level of Consciousness: Alert Mood: Depressed Affect: Appropriate to circumstance;Depressed Anxiety Level: Minimal Thought Processes: Coherent;Relevant Judgement: Unimpaired Orientation: Person;Place;Time;Situation Obsessive Compulsive Thoughts/Behaviors: None  Cognitive Functioning Concentration: Normal Memory: Recent Intact;Remote Intact IQ: Average Insight: Fair Impulse Control: Fair Appetite: Good Sleep: No Change Total Hours of Sleep: 8  Vegetative Symptoms: None  ADLScreening [facility] Assessment Services) Patient's cognitive ability adequate to safely complete daily activities?: Yes Patient able to express need for assistance with ADLs?: Yes Independently performs ADLs?: Yes  Abuse/Neglect [facility]) Physical Abuse: Denies Verbal Abuse: Denies Sexual Abuse: Denies  Prior Inpatient Therapy Prior Inpatient Therapy: No Prior Therapy Dates: NA Prior Therapy Facilty/Provider(s): NA Reason for Treatment: NA  Prior Outpatient Therapy Prior Outpatient Therapy: No Prior Therapy Dates: NA Prior Therapy Facilty/Provider(s): NA Reason for Treatment: NA  ADL Screening (condition at time of admission) Patient's cognitive ability adequate to safely complete daily activities?: Yes Patient able to express need for assistance with ADLs?: Yes Independently performs ADLs?: Yes Weakness of Legs: None Weakness of Arms/Hands: None  Home Assistive Devices/Equipment Home Assistive Devices/Equipment: None  Therapy Consults (therapy consults require a physician order) PT Evaluation Needed: No OT Evalulation Needed: No SLP Evaluation Needed:  No Abuse/Neglect Assessment (Assessment to be complete while patient is alone) Physical Abuse: Denies Verbal Abuse: Denies Sexual Abuse: Denies Exploitation of patient/patient's resources: Denies Self-Neglect: Denies Values / Beliefs Cultural Requests During Hospitalization: None Spiritual Requests During Hospitalization: None Consults Spiritual Care Consult Needed: No Social Work Consult Needed: No [employer]chant [employer] officer (For Healthcare) Advance Directive: Patient does not have advance directive;Patient would not like information Pre-existing out of facility DNR order (yellow form or pink MOST form): No    Additional Information 1:1 In Past 12 Months?: No CIRT Risk: No Elopement Risk: No Does patient have medical clearance?: Yes     Disposition: REFERRED TO [facility] Disposition Disposition of Patient: Inpatient treatment program Type of inpatient treatment program: Adult  On Site Evaluation by:   Reviewed with Physician  DR JOE ZAMMIT   Hattie Perch Winford 08/23/2011 9:40 PM

## 2011-08-23 NOTE — ED Provider Notes (Signed)
History     CSN: [id number]  Arrival date & time 08/23/11  1631   First MD Initiated Contact with Patient 08/23/11 1755      Chief Complaint  Patient presents with  . Medical Clearance    (Consider location/radiation/quality/duration/timing/severity/associated sxs/prior treatment) Patient is a 61 y.o. male presenting with alcohol problem. The history is provided by the patient (pt wants help for drinking). No language interpreter was used.  Alcohol Problem This is a recurrent problem. The current episode started more than 1 week ago. The problem occurs constantly. The problem has not changed since onset.Pertinent negatives include no chest pain, no abdominal pain and no headaches. Nothing aggravates the symptoms. Nothing relieves the symptoms. He has tried nothing for the symptoms. The treatment provided no relief.    Past Medical History  Diagnosis Date  . Chest pain   . Atrial flutter   . Alcohol abuse   . HTN (hypertension)   . Diabetes mellitus   . Cardiomyopathy     EF 45-50%, 2-D echo, 2009; 55-60%, by dobutamine stress echocardiogram, 5/13    Past Surgical History  Procedure Date  . Tonsillectomy   . Appendectomy     History reviewed. No pertinent family history.  History  Substance Use Topics  . Smoking status: Current Everyday Smoker  . Smokeless tobacco: Not on file  . Alcohol Use: Yes      Review of Systems  Constitutional: Negative for fatigue.  HENT: Negative for congestion, sinus pressure and ear discharge.   Eyes: Negative for discharge.  Respiratory: Negative for cough.   Cardiovascular: Negative for chest pain.  Gastrointestinal: Negative for abdominal pain and diarrhea.  Genitourinary: Negative for frequency and hematuria.  Musculoskeletal: Negative for back pain.  Skin: Negative for rash.  Neurological: Negative for seizures and headaches.  Hematological: Negative.   Psychiatric/Behavioral: Negative for hallucinations.    Allergies    Review of patient's allergies indicates no known allergies.  Home Medications   Current Outpatient Rx  Name Route Sig Dispense Refill  . ALBUTEROL SULFATE HFA 108 (90 BASE) MCG/ACT IN AERS Inhalation Inhale 2 puffs into the lungs every 6 (six) hours as needed.    . BUDESONIDE-FORMOTEROL FUMARATE 160-4.5 MCG/ACT IN AERO Inhalation Inhale 2 puffs into the lungs 2 (two) times daily.    Marland Kitchen CITALOPRAM HYDROBROMIDE 10 MG PO TABS Oral Take 5 mg by mouth daily.    Marland Kitchen DILTIAZEM HCL 120 MG PO TABS Oral Take 120 mg by mouth 2 (two) times daily.    Marland Kitchen LISINOPRIL 5 MG PO TABS Oral Take 5 mg by mouth daily.    Marland Kitchen METFORMIN HCL 500 MG PO TABS Oral Take 500 mg by mouth 3 (three) times daily.    Marland Kitchen METOPROLOL TARTRATE 100 MG PO TABS Oral Take 1 tablet (100 mg total) by mouth 2 (two) times daily. 60 tablet 6    Dose increased 06/29/2011.    BP 103/75  Pulse 108  Temp 98.6 F (37 C) (Oral)  Resp 20  Ht 6\' 1"  (1.854 m)  Wt 248 lb (112.492 kg)  BMI 32.72 kg/m2  SpO2 93%  Physical Exam  Constitutional: He is oriented to person, place, and time. He appears well-developed.  HENT:  Head: Normocephalic and atraumatic.  Eyes: Conjunctivae and EOM are normal. No scleral icterus.  Neck: Neck supple. No thyromegaly present.  Cardiovascular: Normal rate.  Exam reveals no gallop and no friction rub.   No murmur heard.      Irregular heart  beat  Pulmonary/Chest: No stridor. He has no wheezes. He has no rales. He exhibits no tenderness.  Abdominal: He exhibits no distension. There is no tenderness. There is no rebound.  Musculoskeletal: Normal range of motion. He exhibits no edema.  Lymphadenopathy:    He has no cervical adenopathy.  Neurological: He is oriented to person, place, and time. Coordination normal.  Skin: No rash noted. No erythema.  Psychiatric:       Mild depression    ED Course  Procedures (including critical care time)  Labs Reviewed  CBC WITH DIFFERENTIAL - Abnormal; Notable for the  following:    MCV 103.6 (*)     MCH 35.0 (*)     All other components within normal limits  COMPREHENSIVE METABOLIC PANEL - Abnormal; Notable for the following:    Creatinine, Ser 1.53 (*)     Total Bilirubin 0.2 (*)     GFR calc non Af Amer 48 (*)     GFR calc Af Amer 56 (*)     All other components within normal limits  ETHANOL - Abnormal; Notable for the following:    Alcohol, Ethyl (B) 127 (*)     All other components within normal limits  URINE RAPID DRUG SCREEN (HOSP PERFORMED)   No results found.   No diagnosis found.    MDM          Benny Lennert, MD 08/23/11 2052

## 2011-08-23 NOTE — ED Notes (Signed)
Pt presents with family members, states wants [facility] withdrawal treatment. Pt reports drinking a pint of vodka a day. Denies trouble with law enforcement due to drinking.  Pt reports "tired of drinking his life away". And "family is tired of him drinking daily". Pt is cooperative and pleasant at this time. Denies withdrawal symptoms at this time. No acute distress noted. States last time he drank was this morning. Smell of alcohol noted on pt's breath. No sitter required at this time as pt denies SI/HI.

## 2011-08-24 LAB — GLUCOSE, CAPILLARY
Glucose-Capillary: 155 mg/dL — ABNORMAL HIGH (ref 70–99)
Glucose-Capillary: 75 mg/dL (ref 70–99)

## 2011-08-24 NOTE — ED Notes (Signed)
BGL 155

## 2011-08-24 NOTE — ED Notes (Signed)
Report given to care [facility] RN upon call. Pt notified of en-route transport team. Pt is calm and cooperative at this time with slight hesitation of pending 28 day program and length of stay. Pt states is ready to make a change.

## 2011-08-24 NOTE — ED Notes (Signed)
Pt's HR elevated.  Pt not shakey, says does not feel like is going through withdrawals.  Notified EDP, medications given.  Ativan given.

## 2011-08-24 NOTE — ED Notes (Signed)
Dr. Bebe Shaggy aware of EKG, reports pt is asymptomatic ok to call report.  Called report to Annetta Maw RN but was told pt could not come until HR was slower.  Notified Dr. Bebe Shaggy.

## 2011-08-24 NOTE — ED Notes (Signed)
Called pharmacy for missing meds.

## 2011-08-24 NOTE — ED Provider Notes (Addendum)
Pt seen, he is awaiting placement for ETOH detox He did not take his meds at home, and he is in aflutter rate of 122 He feels well, denies any complaints, no cp/weakness reported.  No tremor noted I doubt he is actively withdrawing Home meds ordered Review of chart reveals he is not a coumadin candidate due to his ETOH use   Date: 08/24/2011  Rate: 122  Rhythm: atrial flutter  QRS Axis: normal  Intervals: normal  ST/T Wave abnormalities: nonspecific ST changes  Conduction Disutrbances:none     Joya Gaskins, MD 08/24/11 1050  Joya Gaskins, MD 08/24/11 1051

## 2011-08-24 NOTE — BH Assessment (Signed)
Assessment Note   Chase Herrera is an 61 y.o. male.   RECEIVED CALL THAT PT WAS ACCEPTED AT [facility] BY DR Chase Herrera . CALL REPORT TO [phone].  Axis I: Substance Abuse  ALCOHOL DEPENDENCY Axis II: Deferred Axis III:  Past Medical History  Diagnosis Date  . Chest pain   . Atrial flutter   . Alcohol abuse   . HTN (hypertension)   . Diabetes mellitus   . Cardiomyopathy     EF 45-50%, 2-D echo, 2009; 55-60%, by dobutamine stress echocardiogram, 5/13   Axis IV: other psychosocial or environmental problems Axis V: 51-60 moderate symptoms       Past Medical History:  Past Medical History  Diagnosis Date  . Chest pain   . Atrial flutter   . Alcohol abuse   . HTN (hypertension)   . Diabetes mellitus   . Cardiomyopathy     EF 45-50%, 2-D echo, 2009; 55-60%, by dobutamine stress echocardiogram, 5/13    Past Surgical History  Procedure Date  . Tonsillectomy   . Appendectomy     Family History: History reviewed. No pertinent family history.  Social History:  reports that he has been smoking.  He does not have any smokeless tobacco history on file. He reports that he drinks alcohol. He reports that he does not use illicit drugs.  Additional Social History:  Alcohol / Drug Use Pain Medications: na Prescriptions: na Over the Counter: na History of alcohol / drug use?: Yes Substance #1 Name of Substance 1: alcohol 1 - Age of First Use: 22 1 - Amount (size/oz): 1 pt  1 - Frequency: daily 1 - Duration: years 1 - Last Use / Amount: 08/23/11  CIWA: CIWA-Ar BP: 126/90 mmHg Pulse Rate: 119  Nausea and Vomiting: mild nausea with no vomiting Tactile Disturbances: none Tremor: no tremor Auditory Disturbances: not present Paroxysmal Sweats: no sweat visible Visual Disturbances: not present Anxiety: no anxiety, at ease Headache, Fullness in Head: none present Agitation: normal activity Orientation and Clouding of Sensorium: oriented and can do serial  additions CIWA-Ar Total: 1  COWS:    Allergies: No Known Allergies  Home Medications:  (Not in a hospital admission)  OB/GYN Status:  No LMP for male patient.  General Assessment Data Location of Assessment: [facility] ACT Assessment: Yes Living Arrangements: Alone Can pt return to current living arrangement?: Yes Admission Status: Voluntary Is patient capable of signing voluntary admission?: Yes Transfer from: [facility] er) Referral Source: MD (DR Chase Herrera)  Education Status Contact person: Chase Herrera  Risk to self Suicidal Ideation: No Suicidal Intent: No Is patient at risk for suicide?: No Suicidal Plan?: No Access to Means: No What has been your use of drugs/alcohol within the last 12 months?: ALCOHOL Previous Attempts/Gestures: No How many times?: 0  Other Self Harm Risks: NA Triggers for Past Attempts: None known Intentional Self Injurious Behavior: None Family Suicide History: No Recent stressful life event(s): Loss (Comment) (MOTHER DIED 1 YEAR AGO) Persecutory voices/beliefs?: No Depression: No Substance abuse history and/or treatment for substance abuse?: Yes Suicide prevention information given to non-admitted patients: Not applicable  Risk to Others Homicidal Ideation: No Thoughts of Harm to Others: No Current Homicidal Intent: No Current Homicidal Plan: No Access to Homicidal Means: No History of harm to others?: No Assessment of Violence: None Noted Violent Behavior Description: NA Does patient have access to weapons?: No Criminal Charges Pending?: No Does patient have a court date: No  Psychosis Hallucinations:  None noted Delusions: None noted  Mental Status Report Appear/Hygiene: Improved Eye Contact: Good Motor Activity: Freedom of movement Speech: Logical/coherent Level of Consciousness: Alert Mood: Depressed Affect: Appropriate to circumstance;Depressed Anxiety Level: Minimal Thought  Processes: Coherent;Relevant Judgement: Unimpaired Orientation: Person;Place;Time;Situation Obsessive Compulsive Thoughts/Behaviors: None  Cognitive Functioning Concentration: Normal Memory: Recent Intact;Remote Intact IQ: Average Insight: Fair Impulse Control: Fair Appetite: Good Sleep: No Change Total Hours of Sleep: 8  Vegetative Symptoms: None  ADLScreening [facility] Assessment Services) Patient's cognitive ability adequate to safely complete daily activities?: Yes Patient able to express need for assistance with ADLs?: Yes Independently performs ADLs?: Yes  Abuse/Neglect [facility]) Physical Abuse: Denies Verbal Abuse: Denies Sexual Abuse: Denies  Prior Inpatient Therapy Prior Inpatient Therapy: No Prior Therapy Dates: NA Prior Therapy Facilty/Provider(s): NA Reason for Treatment: NA  Prior Outpatient Therapy Prior Outpatient Therapy: No Prior Therapy Dates: NA Prior Therapy Facilty/Provider(s): NA Reason for Treatment: NA  ADL Screening (condition at time of admission) Patient's cognitive ability adequate to safely complete daily activities?: Yes Patient able to express need for assistance with ADLs?: Yes Independently performs ADLs?: Yes Weakness of Legs: None Weakness of Arms/Hands: None  Home Assistive Devices/Equipment Home Assistive Devices/Equipment: None  Therapy Consults (therapy consults require a physician order) PT Evaluation Needed: No OT Evalulation Needed: No SLP Evaluation Needed: No Abuse/Neglect Assessment (Assessment to be complete while patient is alone) Physical Abuse: Denies Verbal Abuse: Denies Sexual Abuse: Denies Exploitation of patient/patient's resources: Denies Self-Neglect: Denies Values / Beliefs Cultural Requests During Hospitalization: None Spiritual Requests During Hospitalization: None Consults Spiritual Care Consult Needed: No Social Work Consult Needed: No [employer]chant [employer] officer (For Healthcare) Advance Directive: Patient does  not have advance directive;Patient would not like information Pre-existing out of facility DNR order (yellow form or pink MOST form): No    Additional Information 1:1 In Past 12 Months?: No CIRT Risk: No Elopement Risk: No Does patient have medical clearance?: Yes     [facility] Referred to ([facility]) Type of inpatient treatment program: Adult  On Site Evaluation by:   Reviewed with Physician:  DR Inez Pilgrim, Chase Herrera 08/24/2011 8:51 AM

## 2011-08-24 NOTE — ED Notes (Signed)
Pt notified of pending transport. [facility] notified.

## 2011-08-31 DIAGNOSIS — R918 Other nonspecific abnormal finding of lung field: Secondary | ICD-10-CM | POA: Diagnosis not present

## 2011-08-31 DIAGNOSIS — I509 Heart failure, unspecified: Secondary | ICD-10-CM

## 2011-08-31 DIAGNOSIS — R0602 Shortness of breath: Secondary | ICD-10-CM | POA: Diagnosis not present

## 2011-09-01 DIAGNOSIS — I509 Heart failure, unspecified: Secondary | ICD-10-CM | POA: Diagnosis not present

## 2011-09-01 DIAGNOSIS — R0609 Other forms of dyspnea: Secondary | ICD-10-CM | POA: Diagnosis not present

## 2011-09-01 DIAGNOSIS — R0989 Other specified symptoms and signs involving the circulatory and respiratory systems: Secondary | ICD-10-CM | POA: Diagnosis not present

## 2011-09-02 DIAGNOSIS — R0989 Other specified symptoms and signs involving the circulatory and respiratory systems: Secondary | ICD-10-CM | POA: Diagnosis not present

## 2011-09-02 DIAGNOSIS — R0609 Other forms of dyspnea: Secondary | ICD-10-CM | POA: Diagnosis not present

## 2011-09-02 DIAGNOSIS — I509 Heart failure, unspecified: Secondary | ICD-10-CM | POA: Diagnosis not present

## 2011-09-02 DIAGNOSIS — I4892 Unspecified atrial flutter: Secondary | ICD-10-CM

## 2011-09-03 ENCOUNTER — Encounter: Payer: Self-pay | Admitting: Physician Assistant

## 2011-09-03 DIAGNOSIS — I509 Heart failure, unspecified: Secondary | ICD-10-CM | POA: Diagnosis not present

## 2011-09-03 DIAGNOSIS — R0609 Other forms of dyspnea: Secondary | ICD-10-CM | POA: Diagnosis not present

## 2011-09-04 DIAGNOSIS — R0609 Other forms of dyspnea: Secondary | ICD-10-CM | POA: Diagnosis not present

## 2011-09-04 DIAGNOSIS — I5023 Acute on chronic systolic (congestive) heart failure: Secondary | ICD-10-CM

## 2011-09-04 DIAGNOSIS — I509 Heart failure, unspecified: Secondary | ICD-10-CM | POA: Diagnosis not present

## 2011-09-05 DIAGNOSIS — O879 Venous complication in the puerperium, unspecified: Secondary | ICD-10-CM | POA: Diagnosis not present

## 2011-09-05 DIAGNOSIS — IMO0002 Reserved for concepts with insufficient information to code with codable children: Secondary | ICD-10-CM | POA: Diagnosis not present

## 2011-09-05 DIAGNOSIS — O870 Superficial thrombophlebitis in the puerperium: Secondary | ICD-10-CM | POA: Diagnosis not present

## 2011-09-06 DIAGNOSIS — E119 Type 2 diabetes mellitus without complications: Secondary | ICD-10-CM | POA: Diagnosis not present

## 2011-09-06 DIAGNOSIS — IMO0002 Reserved for concepts with insufficient information to code with codable children: Secondary | ICD-10-CM | POA: Diagnosis not present

## 2011-09-06 DIAGNOSIS — I4892 Unspecified atrial flutter: Secondary | ICD-10-CM | POA: Diagnosis not present

## 2011-09-06 DIAGNOSIS — I10 Essential (primary) hypertension: Secondary | ICD-10-CM | POA: Diagnosis not present

## 2011-09-06 DIAGNOSIS — I4891 Unspecified atrial fibrillation: Secondary | ICD-10-CM | POA: Diagnosis not present

## 2011-09-07 ENCOUNTER — Encounter: Payer: Self-pay | Admitting: Physician Assistant

## 2011-09-07 DIAGNOSIS — I10 Essential (primary) hypertension: Secondary | ICD-10-CM | POA: Diagnosis not present

## 2011-09-07 DIAGNOSIS — F17200 Nicotine dependence, unspecified, uncomplicated: Secondary | ICD-10-CM | POA: Diagnosis not present

## 2011-09-07 DIAGNOSIS — Z7901 Long term (current) use of anticoagulants: Secondary | ICD-10-CM | POA: Diagnosis not present

## 2011-09-07 DIAGNOSIS — Z79899 Other long term (current) drug therapy: Secondary | ICD-10-CM | POA: Diagnosis not present

## 2011-09-07 DIAGNOSIS — E119 Type 2 diabetes mellitus without complications: Secondary | ICD-10-CM | POA: Diagnosis not present

## 2011-09-07 DIAGNOSIS — J209 Acute bronchitis, unspecified: Secondary | ICD-10-CM | POA: Diagnosis not present

## 2011-09-07 DIAGNOSIS — R0602 Shortness of breath: Secondary | ICD-10-CM

## 2011-09-07 DIAGNOSIS — Z7982 Long term (current) use of aspirin: Secondary | ICD-10-CM | POA: Diagnosis not present

## 2011-09-07 DIAGNOSIS — J9819 Other pulmonary collapse: Secondary | ICD-10-CM | POA: Diagnosis not present

## 2011-09-07 DIAGNOSIS — J440 Chronic obstructive pulmonary disease with acute lower respiratory infection: Secondary | ICD-10-CM | POA: Diagnosis not present

## 2011-09-13 ENCOUNTER — Encounter: Payer: Self-pay | Admitting: Physician Assistant

## 2011-09-13 DIAGNOSIS — J441 Chronic obstructive pulmonary disease with (acute) exacerbation: Secondary | ICD-10-CM | POA: Diagnosis not present

## 2011-09-13 DIAGNOSIS — Z7982 Long term (current) use of aspirin: Secondary | ICD-10-CM | POA: Diagnosis not present

## 2011-09-13 DIAGNOSIS — I4891 Unspecified atrial fibrillation: Secondary | ICD-10-CM | POA: Diagnosis not present

## 2011-09-13 DIAGNOSIS — F411 Generalized anxiety disorder: Secondary | ICD-10-CM | POA: Diagnosis not present

## 2011-09-13 DIAGNOSIS — F17200 Nicotine dependence, unspecified, uncomplicated: Secondary | ICD-10-CM | POA: Diagnosis not present

## 2011-09-13 DIAGNOSIS — R0789 Other chest pain: Secondary | ICD-10-CM | POA: Diagnosis not present

## 2011-09-13 DIAGNOSIS — I4892 Unspecified atrial flutter: Secondary | ICD-10-CM | POA: Diagnosis not present

## 2011-09-13 DIAGNOSIS — Z79899 Other long term (current) drug therapy: Secondary | ICD-10-CM | POA: Diagnosis not present

## 2011-09-13 DIAGNOSIS — E119 Type 2 diabetes mellitus without complications: Secondary | ICD-10-CM | POA: Diagnosis not present

## 2011-09-13 DIAGNOSIS — R0602 Shortness of breath: Secondary | ICD-10-CM | POA: Diagnosis not present

## 2011-09-13 DIAGNOSIS — I509 Heart failure, unspecified: Secondary | ICD-10-CM | POA: Diagnosis not present

## 2011-09-13 DIAGNOSIS — I10 Essential (primary) hypertension: Secondary | ICD-10-CM | POA: Diagnosis not present

## 2011-09-14 ENCOUNTER — Other Ambulatory Visit: Payer: Self-pay | Admitting: Physician Assistant

## 2011-09-21 ENCOUNTER — Ambulatory Visit (INDEPENDENT_AMBULATORY_CARE_PROVIDER_SITE_OTHER): Payer: Medicare Other | Admitting: Physician Assistant

## 2011-09-21 ENCOUNTER — Ambulatory Visit (INDEPENDENT_AMBULATORY_CARE_PROVIDER_SITE_OTHER): Payer: Medicare Other | Admitting: *Deleted

## 2011-09-21 ENCOUNTER — Encounter: Payer: Self-pay | Admitting: Physician Assistant

## 2011-09-21 VITALS — BP 98/65 | HR 66 | Ht 73.0 in | Wt 235.0 lb

## 2011-09-21 DIAGNOSIS — Z7901 Long term (current) use of anticoagulants: Secondary | ICD-10-CM | POA: Diagnosis not present

## 2011-09-21 DIAGNOSIS — I10 Essential (primary) hypertension: Secondary | ICD-10-CM

## 2011-09-21 DIAGNOSIS — I4892 Unspecified atrial flutter: Secondary | ICD-10-CM

## 2011-09-21 DIAGNOSIS — F1010 Alcohol abuse, uncomplicated: Secondary | ICD-10-CM

## 2011-09-21 DIAGNOSIS — E119 Type 2 diabetes mellitus without complications: Secondary | ICD-10-CM

## 2011-09-21 LAB — POCT INR: INR: 1.3

## 2011-09-21 MED ORDER — WARFARIN SODIUM 5 MG PO TABS: 5.0000 mg | ORAL_TABLET | ORAL | Status: DC

## 2011-09-21 NOTE — Patient Instructions (Addendum)
Continue all current medications. Referral to EP for possible ablation Follow up after above

## 2011-09-21 NOTE — Progress Notes (Signed)
Primary Cardiologist: Simona Huh, MD   HPI: Post hospital followup from [facility], following most recent hospitalization with atrial flutter with RVR. Patient was maintained on Cardizem and Lopressor for rate control, and was initiated on Coumadin anticoagulation (CHADS2 score: 2). He has since established with our Coumadin clinic (INR 1.3 today). Also, patient reports abstinence from alcohol, of nearly one month duration now.  Clinically, he reportedly has lost approximately 35 pounds over the last month. His lower extremity edema has completely resolved. He does have some residual SOB/DOE and orthopnea. However, he also continues to smoke.  A 2-D echocardiogram was repeated, and now showed deterioration of LVF (EF 25-30%, global HK; moderate LAE) . He had a normal dobutamine stress echocardiogram in May of this year.  12-lead EKG today indicates persistent atrial flutter at approximate 70 bpm.  No Known Allergies  Current Outpatient Prescriptions  Medication Sig Dispense Refill  . albuterol (PROVENTIL HFA;VENTOLIN HFA) 108 (90 BASE) MCG/ACT inhaler Inhale 2 puffs into the lungs every 6 (six) hours as needed.      Marland Kitchen aspirin 325 MG tablet Take 325 mg by mouth every morning.       . budesonide-formoterol (SYMBICORT) 160-4.5 MCG/ACT inhaler Inhale 2 puffs into the lungs 2 (two) times daily.      . citalopram (CELEXA) 20 MG tablet Take 20 mg by mouth every evening.      . clonazePAM (KLONOPIN) 0.5 MG tablet Take 0.5 mg by mouth daily as needed.      . diltiazem (CARDIZEM CD) 240 MG 24 hr capsule Take 240 mg by mouth 2 (two) times daily.      . furosemide (LASIX) 20 MG tablet Take 20 mg by mouth 2 (two) times daily.      Marland Kitchen lisinopril (PRINIVIL,ZESTRIL) 5 MG tablet Take 5 mg by mouth daily.      . metFORMIN (GLUCOPHAGE) 500 MG tablet Take 500 mg by mouth 3 (three) times daily.      . metoprolol (LOPRESSOR) 100 MG tablet Take 1 tablet (100 mg total) by mouth 2 (two) times daily.  60 tablet  6  .  tiotropium (SPIRIVA) 18 MCG inhalation capsule Place 18 mcg into inhaler and inhale every morning.      . warfarin (COUMADIN) 5 MG tablet Take 5 mg by mouth as directed.      . zolpidem (AMBIEN) 10 MG tablet Take 10 mg by mouth at bedtime as needed.        Past Medical History  Diagnosis Date  . Chest pain   . Atrial flutter   . Alcohol abuse   . HTN (hypertension)   . Diabetes mellitus   . Cardiomyopathy     EF 45-50%, 2-D echo, 2009; 55-60%, by dobutamine stress echocardiogram, 5/13    Past Surgical History  Procedure Date  . Tonsillectomy   . Appendectomy     History   Social History  . Marital Status: Single    Spouse Name: N/A    Number of Children: N/A  . Years of Education: N/A   Occupational History  . Not on file.   Social History Main Topics  . Smoking status: Current Everyday Smoker -- 1.0 packs/day for 47 years    Types: Cigarettes  . Smokeless tobacco: Never Used  . Alcohol Use: Yes  . Drug Use: No  . Sexually Active: Not on file   Other Topics Concern  . Not on file   Social History Narrative  . No narrative on file  No family history on file.  ROS: no nausea, vomiting; no fever, chills; no melena, hematochezia; no claudication  PHYSICAL EXAM: BP 98/65  Pulse 66  Ht 6\' 1"  (1.854 m)  Wt 235 lb (106.595 kg)  BMI 31.00 kg/m2 GENERAL: 61 year old male, obese; NAD HEENT: NCAT, PERRLA, EOMI; sclera clear; no xanthelasma NECK: palpable bilateral carotid pulses, no bruits; no JVD; no TM LUNGS: Diminished breath sounds, but no crackles or wheezes CARDIAC: RRR (S1, S2); no significant murmurs; no rubs or gallops ABDOMEN: Protuberant EXTREMETIES: no significant peripheral edema SKIN: warm/dry; no obvious rash/lesions MUSCULOSKELETAL: no joint deformity NEURO: no focal deficit; NL affect   EKG: reviewed and available in Electronic Records   ASSESSMENT & PLAN:  Atrial flutter Following consultation with Dr. Andee Lineman, recommendation is as  follows: Patient will be referred to our EP team for consideration of RF ablation of his persistent typical atrial flutter. Following this procedure, we then will reassess his LVF to see if there's been any recovery. We suspect that this recent deterioration in LVF is tachycardia mediated, given that he had a normal stress test in May. If, however, he is EF remains persistently low, then plan is to proceed with a diagnostic coronary angiogram, in the near future. Patient is agreeable with this recommendation. Regarding medications, patient will be maintained on current doses of beta blocker and calcium channel blocker. He'll also continue on Coumadin anticoagulation, with recommendation to DC ASA, once he obtains therapeutic INR levels.  HTN (hypertension) Well-controlled on current regimen  Diabetes mellitus Followed by primary M.D.  ETOH abuse The importance of total abstinence was reemphasized, and patient has now been compliant with this for nearly one month.    Gene Quaran Kedzierski, PAC

## 2011-09-21 NOTE — Assessment & Plan Note (Signed)
The importance of total abstinence was reemphasized, and patient has now been compliant with this for nearly one month.

## 2011-09-21 NOTE — Assessment & Plan Note (Signed)
Followed by primary M.D. 

## 2011-09-21 NOTE — Assessment & Plan Note (Signed)
Well-controlled on current regimen. ?

## 2011-09-21 NOTE — Assessment & Plan Note (Signed)
Following consultation with Dr. Andee Lineman, recommendation is as follows: Patient will be referred to our EP team for consideration of RF ablation of his persistent typical atrial flutter. Following this procedure, we then will reassess his LVF to see if there's been any recovery. We suspect that this recent deterioration in LVF is tachycardia mediated, given that he had a normal stress test in May. If, however, he is EF remains persistently low, then plan is to proceed with a diagnostic coronary angiogram, in the near future. Patient is agreeable with this recommendation. Regarding medications, patient will be maintained on current doses of beta blocker and calcium channel blocker. He'll also continue on Coumadin anticoagulation, with recommendation to DC ASA, once he obtains therapeutic INR levels.

## 2011-09-28 ENCOUNTER — Ambulatory Visit (INDEPENDENT_AMBULATORY_CARE_PROVIDER_SITE_OTHER): Payer: Medicare Other | Admitting: *Deleted

## 2011-09-28 DIAGNOSIS — I4892 Unspecified atrial flutter: Secondary | ICD-10-CM | POA: Diagnosis not present

## 2011-09-28 DIAGNOSIS — Z7901 Long term (current) use of anticoagulants: Secondary | ICD-10-CM | POA: Diagnosis not present

## 2011-09-28 LAB — POCT INR: INR: 1.7

## 2011-10-05 ENCOUNTER — Ambulatory Visit (INDEPENDENT_AMBULATORY_CARE_PROVIDER_SITE_OTHER): Payer: Medicare Other | Admitting: *Deleted

## 2011-10-05 ENCOUNTER — Encounter: Payer: Self-pay | Admitting: *Deleted

## 2011-10-05 DIAGNOSIS — I4892 Unspecified atrial flutter: Secondary | ICD-10-CM | POA: Diagnosis not present

## 2011-10-05 DIAGNOSIS — Z7901 Long term (current) use of anticoagulants: Secondary | ICD-10-CM | POA: Diagnosis not present

## 2011-10-05 LAB — POCT INR: INR: 1.6

## 2011-10-09 ENCOUNTER — Ambulatory Visit (INDEPENDENT_AMBULATORY_CARE_PROVIDER_SITE_OTHER): Payer: Medicare Other | Admitting: *Deleted

## 2011-10-09 DIAGNOSIS — I4892 Unspecified atrial flutter: Secondary | ICD-10-CM | POA: Diagnosis not present

## 2011-10-09 DIAGNOSIS — Z7901 Long term (current) use of anticoagulants: Secondary | ICD-10-CM | POA: Diagnosis not present

## 2011-10-09 LAB — POCT INR: INR: 1.8

## 2011-10-11 DIAGNOSIS — Z23 Encounter for immunization: Secondary | ICD-10-CM | POA: Diagnosis not present

## 2011-10-12 ENCOUNTER — Ambulatory Visit (INDEPENDENT_AMBULATORY_CARE_PROVIDER_SITE_OTHER): Payer: Medicare Other | Admitting: *Deleted

## 2011-10-12 DIAGNOSIS — Z7901 Long term (current) use of anticoagulants: Secondary | ICD-10-CM | POA: Diagnosis not present

## 2011-10-12 DIAGNOSIS — I4892 Unspecified atrial flutter: Secondary | ICD-10-CM

## 2011-10-12 LAB — POCT INR: INR: 2.4

## 2011-10-15 ENCOUNTER — Ambulatory Visit (INDEPENDENT_AMBULATORY_CARE_PROVIDER_SITE_OTHER): Payer: Medicare Other | Admitting: Internal Medicine

## 2011-10-15 ENCOUNTER — Encounter: Payer: Self-pay | Admitting: *Deleted

## 2011-10-15 ENCOUNTER — Other Ambulatory Visit: Payer: Self-pay | Admitting: *Deleted

## 2011-10-15 ENCOUNTER — Encounter: Payer: Self-pay | Admitting: Internal Medicine

## 2011-10-15 VITALS — BP 118/72 | HR 80 | Ht 73.0 in | Wt 245.0 lb

## 2011-10-15 DIAGNOSIS — F17200 Nicotine dependence, unspecified, uncomplicated: Secondary | ICD-10-CM | POA: Diagnosis not present

## 2011-10-15 DIAGNOSIS — J209 Acute bronchitis, unspecified: Secondary | ICD-10-CM | POA: Diagnosis not present

## 2011-10-15 DIAGNOSIS — Z79899 Other long term (current) drug therapy: Secondary | ICD-10-CM | POA: Diagnosis not present

## 2011-10-15 DIAGNOSIS — Z7982 Long term (current) use of aspirin: Secondary | ICD-10-CM | POA: Diagnosis not present

## 2011-10-15 DIAGNOSIS — E119 Type 2 diabetes mellitus without complications: Secondary | ICD-10-CM | POA: Diagnosis not present

## 2011-10-15 DIAGNOSIS — I428 Other cardiomyopathies: Secondary | ICD-10-CM | POA: Insufficient documentation

## 2011-10-15 DIAGNOSIS — F1010 Alcohol abuse, uncomplicated: Secondary | ICD-10-CM

## 2011-10-15 DIAGNOSIS — J9819 Other pulmonary collapse: Secondary | ICD-10-CM | POA: Diagnosis not present

## 2011-10-15 DIAGNOSIS — I10 Essential (primary) hypertension: Secondary | ICD-10-CM | POA: Diagnosis not present

## 2011-10-15 DIAGNOSIS — J438 Other emphysema: Secondary | ICD-10-CM | POA: Diagnosis not present

## 2011-10-15 DIAGNOSIS — J449 Chronic obstructive pulmonary disease, unspecified: Secondary | ICD-10-CM | POA: Diagnosis not present

## 2011-10-15 DIAGNOSIS — Z720 Tobacco use: Secondary | ICD-10-CM | POA: Insufficient documentation

## 2011-10-15 DIAGNOSIS — I4892 Unspecified atrial flutter: Secondary | ICD-10-CM

## 2011-10-15 DIAGNOSIS — R0602 Shortness of breath: Secondary | ICD-10-CM

## 2011-10-15 DIAGNOSIS — Z7901 Long term (current) use of anticoagulants: Secondary | ICD-10-CM | POA: Diagnosis not present

## 2011-10-15 NOTE — Assessment & Plan Note (Signed)
Cessation is strongly advised He is not ready to quit 

## 2011-10-15 NOTE — Assessment & Plan Note (Signed)
The patient has recurrent symptomatic typical appearing atrial flutter. Therapeutic strategies for atrial flutter including medicine and ablation were discussed in detail with the patient today. Risk, benefits, and alternatives to EP study and radiofrequency ablation were also discussed in detail today. These risks include but are not limited to stroke, bleeding, vascular damage, tamponade, perforation, damage to the heart and other structures, AV block requiring pacemaker, worsening renal function, and death. The patient understands these risk and wishes to proceed.  We will therefore proceed with catheter ablation once INRs have been consistently between 2-3 for 3 weeks.

## 2011-10-15 NOTE — Assessment & Plan Note (Signed)
Possibly tachycardia mediated (atrial flutter) or due to ETOH. The importance of ETOH cessation was again discussed today.  ETOH avoidance, atrial flutter management (as above), and optimal medical therapy.  If EF remains <35% after 3-6 months post ablation then ICD could be considered.  He will continue to follow closely with Dr Diona Browner in the interim for medical management.

## 2011-10-15 NOTE — Assessment & Plan Note (Signed)
Cessation encouraged 

## 2011-10-15 NOTE — Patient Instructions (Addendum)

## 2011-10-15 NOTE — Progress Notes (Signed)
 Primary Care Physician: SASSER,Nero W, MD Referring Physician:  Dr DeGent   Chase Herrera is a 61 y.o. male with a h/o recently diagnosed CHF and atrial flutter who presents today for EP consultation.  He has a h/o heavy ETOH.  He reports that in 2009, he presented with sustained tachycardia.  I have reviewed Dr McDowell's note from 2009 which revealed that he had atrial flutter (and possibly also atrial fibrillation) at that time.  His EF was 45-50%.  Ablation was encouraged however he refused.  He did not follow-up and continued to drink heavily.  His family recently became involved and forced him into rehab 08/23/11.  While receiving therapy, he developed progressive CHF symptoms and was therefore admitted to [facility].  Echo 09/03/11 revealed EF EF 25-30% with severe LV dysfunction and LA size 46mm and no significant valvular disease. He presented with atrial flutter with RVR.  He has been treated with medical therapy, but remains in atrial flutter.  He reports feeling "fatigued" and has decreased exercise tolerance with his atrial flutter.  He has stopped drinking ETOH.   He has occasional "fluttering" but no sustained racing.  He requires 2 pillows due to orthopnea.  Today, he denies symptoms of chest pain, lower extremity edema (improved), dizziness, presyncope, syncope, or neurologic sequela. The patient is tolerating medications without difficulties and is otherwise without complaint today.   Past Medical History  Diagnosis Date  . Atrial flutter   . Alcohol abuse     quit 08/23/11  . HTN (hypertension)   . Diabetes mellitus   . Cardiomyopathy     EF 45-50%, 2-D echo, 2009; 55-60%, by dobutamine stress echocardiogram, 5/13  . COPD (chronic obstructive pulmonary disease)    Past Surgical History  Procedure Date  . Tonsillectomy   . Appendectomy     Current Outpatient Prescriptions  Medication Sig Dispense Refill  . albuterol (PROVENTIL HFA;VENTOLIN HFA) 108 (90 BASE)  MCG/ACT inhaler Inhale 2 puffs into the lungs every 6 (six) hours as needed.      . aspirin 325 MG tablet Take 325 mg by mouth every morning.       . budesonide-formoterol (SYMBICORT) 160-4.5 MCG/ACT inhaler Inhale 2 puffs into the lungs 2 (two) times daily.      . citalopram (CELEXA) 20 MG tablet Take 20 mg by mouth every evening.      . clonazePAM (KLONOPIN) 0.5 MG tablet Take 0.5 mg by mouth daily as needed.      . diltiazem (CARDIZEM CD) 240 MG 24 hr capsule Take 240 mg by mouth 2 (two) times daily.      . furosemide (LASIX) 20 MG tablet Take 20 mg by mouth daily.       . lisinopril (PRINIVIL,ZESTRIL) 5 MG tablet Take 5 mg by mouth daily.      . metFORMIN (GLUCOPHAGE) 500 MG tablet Take 500 mg by mouth 3 (three) times daily.      . metoprolol (LOPRESSOR) 100 MG tablet Take 1 tablet (100 mg total) by mouth 2 (two) times daily.  60 tablet  6  . tiotropium (SPIRIVA) 18 MCG inhalation capsule Place 18 mcg into inhaler and inhale every morning.      . warfarin (COUMADIN) 5 MG tablet Take 1 tablet (5 mg total) by mouth as directed.  60 tablet  2  . zolpidem (AMBIEN) 10 MG tablet Take 10 mg by mouth at bedtime as needed.        No Known Allergies    History   Social History  . Marital Status: Single    Spouse Name: N/A    Number of Children: N/A  . Years of Education: N/A   Occupational History  . Not on file.   Social History Main Topics  . Smoking status: Current Every Day Smoker -- 1.0 packs/day for 47 years    Types: Cigarettes  . Smokeless tobacco: Never Used   Comment: encouraged to quit today  . Alcohol Use: Yes     formerly very heavy ETOH, quit 08/23/11  . Drug Use: No  . Sexually Active: Not on file   Other Topics Concern  . 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 Diabetes    . Hypertension      ROS- All systems are  reviewed and negative except as per the HPI above  Physical Exam: Filed Vitals:   10/15/11 0913  BP: 118/72  Pulse: 80  Height: 6' 1" (1.854 m)  Weight: 245 lb (111.131 kg)    GEN- The patient is overweight appearing, alert and oriented x 3 today.   Head- normocephalic, atraumatic Eyes-  Sclera clear, conjunctiva pink Ears- hearing intact Oropharynx- clear Neck- supple, no JVP Lymph- no cervical lymphadenopathy Lungs- Clear to ausculation bilaterally, normal work of breathing Heart- irregular rate and rhythm, no murmurs, rubs or gallops, PMI not laterally displaced GI- soft, NT, ND, + BS Extremities- no clubbing, cyanosis, or edema MS- no significant deformity or atrophy Skin- no rash or lesion Psych- euthymic mood, full affect Neuro- strength and sensation are intact  EKG today reveals typical appearing atrial flutter, V rate 80 bpm, nonspecific ST/T changes  Echo and myoview reviewed   Assessment and Plan: 

## 2011-10-16 ENCOUNTER — Ambulatory Visit (INDEPENDENT_AMBULATORY_CARE_PROVIDER_SITE_OTHER): Payer: Medicare Other | Admitting: *Deleted

## 2011-10-16 DIAGNOSIS — I4892 Unspecified atrial flutter: Secondary | ICD-10-CM

## 2011-10-16 DIAGNOSIS — Z7901 Long term (current) use of anticoagulants: Secondary | ICD-10-CM

## 2011-10-16 LAB — POCT INR: INR: 2.2

## 2011-10-19 ENCOUNTER — Ambulatory Visit (INDEPENDENT_AMBULATORY_CARE_PROVIDER_SITE_OTHER): Payer: Medicare Other | Admitting: *Deleted

## 2011-10-19 DIAGNOSIS — Z7901 Long term (current) use of anticoagulants: Secondary | ICD-10-CM | POA: Diagnosis not present

## 2011-10-19 DIAGNOSIS — I4892 Unspecified atrial flutter: Secondary | ICD-10-CM

## 2011-10-19 LAB — POCT INR: INR: 2.4

## 2011-10-23 ENCOUNTER — Ambulatory Visit (INDEPENDENT_AMBULATORY_CARE_PROVIDER_SITE_OTHER): Payer: Medicare Other | Admitting: *Deleted

## 2011-10-23 DIAGNOSIS — Z7901 Long term (current) use of anticoagulants: Secondary | ICD-10-CM | POA: Diagnosis not present

## 2011-10-23 DIAGNOSIS — I4892 Unspecified atrial flutter: Secondary | ICD-10-CM

## 2011-10-23 DIAGNOSIS — M2550 Pain in unspecified joint: Secondary | ICD-10-CM | POA: Diagnosis not present

## 2011-10-23 LAB — POCT INR: INR: 2.6

## 2011-10-30 ENCOUNTER — Ambulatory Visit (INDEPENDENT_AMBULATORY_CARE_PROVIDER_SITE_OTHER): Payer: Medicare Other | Admitting: *Deleted

## 2011-10-30 ENCOUNTER — Encounter: Payer: Self-pay | Admitting: Internal Medicine

## 2011-10-30 DIAGNOSIS — Z7901 Long term (current) use of anticoagulants: Secondary | ICD-10-CM | POA: Diagnosis not present

## 2011-10-30 DIAGNOSIS — I4892 Unspecified atrial flutter: Secondary | ICD-10-CM | POA: Diagnosis not present

## 2011-10-30 DIAGNOSIS — I4891 Unspecified atrial fibrillation: Secondary | ICD-10-CM | POA: Diagnosis not present

## 2011-10-30 LAB — POCT INR: INR: 2.5

## 2011-10-31 ENCOUNTER — Encounter (HOSPITAL_COMMUNITY): Payer: Self-pay | Admitting: Pharmacy Technician

## 2011-11-06 ENCOUNTER — Encounter (HOSPITAL_COMMUNITY): Payer: Self-pay | Admitting: Anesthesiology

## 2011-11-06 ENCOUNTER — Encounter (HOSPITAL_COMMUNITY)
Admission: RE | Disposition: A | Discharge status: Home / Self Care | Payer: Self-pay | Source: Ambulatory Visit | Attending: Internal Medicine

## 2011-11-06 ENCOUNTER — Ambulatory Visit (HOSPITAL_COMMUNITY)
Admission: RE | Admit: 2011-11-06 | Discharge: 2011-11-07 | Disposition: A | Discharge status: Home / Self Care | Payer: Medicare Other | Source: Ambulatory Visit | Attending: Internal Medicine | Admitting: Internal Medicine

## 2011-11-06 ENCOUNTER — Ambulatory Visit (HOSPITAL_COMMUNITY): Payer: Medicare Other | Admitting: Anesthesiology

## 2011-11-06 DIAGNOSIS — I428 Other cardiomyopathies: Secondary | ICD-10-CM | POA: Diagnosis not present

## 2011-11-06 DIAGNOSIS — J449 Chronic obstructive pulmonary disease, unspecified: Secondary | ICD-10-CM | POA: Insufficient documentation

## 2011-11-06 DIAGNOSIS — I10 Essential (primary) hypertension: Secondary | ICD-10-CM | POA: Insufficient documentation

## 2011-11-06 DIAGNOSIS — I471 Supraventricular tachycardia, unspecified: Secondary | ICD-10-CM | POA: Insufficient documentation

## 2011-11-06 DIAGNOSIS — E119 Type 2 diabetes mellitus without complications: Secondary | ICD-10-CM | POA: Insufficient documentation

## 2011-11-06 DIAGNOSIS — Z7901 Long term (current) use of anticoagulants: Secondary | ICD-10-CM | POA: Insufficient documentation

## 2011-11-06 DIAGNOSIS — F17200 Nicotine dependence, unspecified, uncomplicated: Secondary | ICD-10-CM | POA: Insufficient documentation

## 2011-11-06 DIAGNOSIS — I4892 Unspecified atrial flutter: Secondary | ICD-10-CM

## 2011-11-06 DIAGNOSIS — M1990 Unspecified osteoarthritis, unspecified site: Secondary | ICD-10-CM | POA: Diagnosis not present

## 2011-11-06 DIAGNOSIS — Z79899 Other long term (current) drug therapy: Secondary | ICD-10-CM | POA: Diagnosis not present

## 2011-11-06 DIAGNOSIS — J4489 Other specified chronic obstructive pulmonary disease: Secondary | ICD-10-CM | POA: Insufficient documentation

## 2011-11-06 HISTORY — DX: Major depressive disorder, single episode, unspecified: F32.9

## 2011-11-06 HISTORY — DX: Unspecified osteoarthritis, unspecified site: M19.90

## 2011-11-06 HISTORY — PX: ATRIAL ABLATION SURGERY: SHX560

## 2011-11-06 HISTORY — PX: ATRIAL FLUTTER ABLATION: SHX5733

## 2011-11-06 HISTORY — DX: Depression, unspecified: F32.A

## 2011-11-06 LAB — CBC
HCT: 47 % (ref 39.0–52.0)
Hemoglobin: 16 g/dL (ref 13.0–17.0)
MCH: 33.4 pg (ref 26.0–34.0)
MCHC: 34 g/dL (ref 30.0–36.0)
MCV: 98.1 fL (ref 78.0–100.0)
Platelets: 180 10*3/uL (ref 150–400)
RBC: 4.79 MIL/uL (ref 4.22–5.81)
RDW: 13.6 % (ref 11.5–15.5)
WBC: 8.9 10*3/uL (ref 4.0–10.5)

## 2011-11-06 LAB — PROTIME-INR
INR: 2.03 — ABNORMAL HIGH (ref 0.00–1.49)
Prothrombin Time: 22.1 seconds — ABNORMAL HIGH (ref 11.6–15.2)

## 2011-11-06 LAB — GLUCOSE, CAPILLARY
Glucose-Capillary: 132 mg/dL — ABNORMAL HIGH (ref 70–99)
Glucose-Capillary: 89 mg/dL (ref 70–99)
Glucose-Capillary: 207 mg/dL — ABNORMAL HIGH (ref 70–99)
Glucose-Capillary: 94 mg/dL (ref 70–99)

## 2011-11-06 SURGERY — ATRIAL FLUTTER ABLATION
Anesthesia: Monitor Anesthesia Care

## 2011-11-06 MED ORDER — MEPERIDINE HCL 25 MG/ML IJ SOLN: 6.2500 mg | INTRAMUSCULAR | Status: DC | PRN

## 2011-11-06 MED ORDER — HYDROCODONE-ACETAMINOPHEN 5-325 MG PO TABS: 1.0000 | ORAL_TABLET | ORAL | Status: DC | PRN

## 2011-11-06 MED ORDER — PHENYLEPHRINE HCL 10 MG/ML IJ SOLN
INTRAMUSCULAR | Status: DC | PRN
  Administered 2011-11-06: 40 ug via INTRAVENOUS
  Administered 2011-11-06 (×3): 120 ug via INTRAVENOUS

## 2011-11-06 MED ORDER — SODIUM CHLORIDE 0.9 % IV SOLN: 250.0000 mL | INTRAVENOUS | Status: DC | PRN

## 2011-11-06 MED ORDER — ALBUTEROL SULFATE (5 MG/ML) 0.5% IN NEBU
2.5000 mg | INHALATION_SOLUTION | Freq: Once | RESPIRATORY_TRACT | Status: AC
  Administered 2011-11-06: 2.5 mg via RESPIRATORY_TRACT

## 2011-11-06 MED ORDER — OXYCODONE HCL 5 MG/5ML PO SOLN: 5.0000 mg | Freq: Once | ORAL | Status: AC | PRN

## 2011-11-06 MED ORDER — ARTIFICIAL TEARS OP OINT
TOPICAL_OINTMENT | OPHTHALMIC | Status: DC | PRN
  Administered 2011-11-06: 1 via OPHTHALMIC

## 2011-11-06 MED ORDER — WARFARIN - PHARMACIST DOSING INPATIENT
Freq: Every day | Status: DC
  Administered 2011-11-06: 18:00:00

## 2011-11-06 MED ORDER — BUPIVACAINE HCL (PF) 0.25 % IJ SOLN
INTRAMUSCULAR | Status: AC
  Filled 2011-11-06: qty 30

## 2011-11-06 MED ORDER — SODIUM CHLORIDE 0.9 % IV SOLN
INTRAVENOUS | Status: DC | PRN
  Administered 2011-11-06: 13:00:00 via INTRAVENOUS

## 2011-11-06 MED ORDER — ZOLPIDEM TARTRATE 5 MG PO TABS
10.0000 mg | ORAL_TABLET | Freq: Every evening | ORAL | Status: DC | PRN
  Administered 2011-11-06: 21:00:00 10 mg via ORAL
  Filled 2011-11-06: qty 2

## 2011-11-06 MED ORDER — SODIUM CHLORIDE 0.9 % IJ SOLN: 3.0000 mL | INTRAMUSCULAR | Status: DC | PRN

## 2011-11-06 MED ORDER — HYDROMORPHONE HCL PF 1 MG/ML IJ SOLN: 0.2500 mg | INTRAMUSCULAR | Status: DC | PRN

## 2011-11-06 MED ORDER — ONDANSETRON HCL 4 MG/2ML IJ SOLN: 4.0000 mg | Freq: Four times a day (QID) | INTRAMUSCULAR | Status: DC | PRN

## 2011-11-06 MED ORDER — WARFARIN SODIUM 10 MG PO TABS
10.0000 mg | ORAL_TABLET | Freq: Once | ORAL | Status: AC
  Administered 2011-11-06: 18:00:00 10 mg via ORAL
  Filled 2011-11-06: qty 1

## 2011-11-06 MED ORDER — OXYCODONE HCL 5 MG PO TABS: 5.0000 mg | ORAL_TABLET | Freq: Once | ORAL | Status: AC | PRN

## 2011-11-06 MED ORDER — WARFARIN SODIUM 10 MG PO TABS
10.0000 mg | ORAL_TABLET | Freq: Every day | ORAL | Status: DC
  Filled 2011-11-06 (×2): qty 1

## 2011-11-06 MED ORDER — ACETAMINOPHEN 325 MG PO TABS: 650.0000 mg | ORAL_TABLET | ORAL | Status: DC | PRN

## 2011-11-06 MED ORDER — ONDANSETRON HCL 4 MG/2ML IJ SOLN: 4.0000 mg | Freq: Once | INTRAMUSCULAR | Status: AC | PRN

## 2011-11-06 MED ORDER — LIDOCAINE HCL (CARDIAC) 20 MG/ML IV SOLN
INTRAVENOUS | Status: DC | PRN
  Administered 2011-11-06: 100 mg via INTRAVENOUS

## 2011-11-06 MED ORDER — PROPOFOL 10 MG/ML IV BOLUS
INTRAVENOUS | Status: DC | PRN
  Administered 2011-11-06: 130 mg via INTRAVENOUS

## 2011-11-06 MED ORDER — CITALOPRAM HYDROBROMIDE 20 MG PO TABS
20.0000 mg | ORAL_TABLET | Freq: Every evening | ORAL | Status: DC
  Administered 2011-11-06: 20 mg via ORAL
  Filled 2011-11-06 (×2): qty 1

## 2011-11-06 MED ORDER — MIDAZOLAM HCL 5 MG/5ML IJ SOLN
INTRAMUSCULAR | Status: DC | PRN
  Administered 2011-11-06 (×2): 1 mg via INTRAVENOUS

## 2011-11-06 MED ORDER — LISINOPRIL 5 MG PO TABS
5.0000 mg | ORAL_TABLET | Freq: Every day | ORAL | Status: DC
  Administered 2011-11-06 – 2011-11-07 (×2): 5 mg via ORAL
  Filled 2011-11-06 (×2): qty 1

## 2011-11-06 MED ORDER — SODIUM CHLORIDE 0.9 % IJ SOLN
3.0000 mL | Freq: Two times a day (BID) | INTRAMUSCULAR | Status: DC
  Administered 2011-11-06 – 2011-11-07 (×2): 3 mL via INTRAVENOUS

## 2011-11-06 MED ORDER — METFORMIN HCL 500 MG PO TABS
500.0000 mg | ORAL_TABLET | Freq: Three times a day (TID) | ORAL | Status: DC
  Administered 2011-11-06 – 2011-11-07 (×2): 500 mg via ORAL
  Filled 2011-11-06 (×5): qty 1

## 2011-11-06 MED ORDER — ALBUTEROL SULFATE HFA 108 (90 BASE) MCG/ACT IN AERS
2.0000 | INHALATION_SPRAY | Freq: Four times a day (QID) | RESPIRATORY_TRACT | Status: DC | PRN
  Administered 2011-11-07: 02:00:00 2 via RESPIRATORY_TRACT
  Filled 2011-11-06: qty 6.7

## 2011-11-06 MED ORDER — ALBUTEROL SULFATE HFA 108 (90 BASE) MCG/ACT IN AERS
2.0000 | INHALATION_SPRAY | Freq: Four times a day (QID) | RESPIRATORY_TRACT | Status: DC
  Administered 2011-11-06: 2 via RESPIRATORY_TRACT
  Filled 2011-11-06: qty 6.7

## 2011-11-06 MED ORDER — FUROSEMIDE 20 MG PO TABS
20.0000 mg | ORAL_TABLET | Freq: Every day | ORAL | Status: DC
  Administered 2011-11-06 – 2011-11-07 (×2): 20 mg via ORAL
  Filled 2011-11-06 (×2): qty 1

## 2011-11-06 MED ORDER — FENTANYL CITRATE 0.05 MG/ML IJ SOLN
INTRAMUSCULAR | Status: DC | PRN
  Administered 2011-11-06 (×2): 50 ug via INTRAVENOUS

## 2011-11-06 NOTE — H&P (View-Only) (Signed)
Primary Care Physician: Estanislado Pandy, MD Referring Physician:  Dr Norwood Levo is a 61 y.o. male with a h/o recently diagnosed CHF and atrial flutter who presents today for EP consultation.  He has a h/o heavy ETOH.  He reports that in 2009, he presented with sustained tachycardia.  I have reviewed Dr McDowell's note from 2009 which revealed that he had atrial flutter (and possibly also atrial fibrillation) at that time.  His EF was 45-50%.  Ablation was encouraged however he refused.  He did not follow-up and continued to drink heavily.  His family recently became involved and forced him into rehab 08/23/11.  While receiving therapy, he developed progressive CHF symptoms and was therefore admitted to [facility].  Echo 09/03/11 revealed EF EF 25-30% with severe LV dysfunction and LA size 46mm and no significant valvular disease. He presented with atrial flutter with RVR.  He has been treated with medical therapy, but remains in atrial flutter.  He reports feeling "fatigued" and has decreased exercise tolerance with his atrial flutter.  He has stopped drinking ETOH.   He has occasional "fluttering" but no sustained racing.  He requires 2 pillows due to orthopnea.  Today, he denies symptoms of chest pain, lower extremity edema (improved), dizziness, presyncope, syncope, or neurologic sequela. The patient is tolerating medications without difficulties and is otherwise without complaint today.   Past Medical History  Diagnosis Date  . Atrial flutter   . Alcohol abuse     quit 08/23/11  . HTN (hypertension)   . Diabetes mellitus   . Cardiomyopathy     EF 45-50%, 2-D echo, 2009; 55-60%, by dobutamine stress echocardiogram, 5/13  . COPD (chronic obstructive pulmonary disease)    Past Surgical History  Procedure Date  . Tonsillectomy   . Appendectomy     Current Outpatient Prescriptions  Medication Sig Dispense Refill  . albuterol (PROVENTIL HFA;VENTOLIN HFA) 108 (90 BASE)  MCG/ACT inhaler Inhale 2 puffs into the lungs every 6 (six) hours as needed.      Marland Kitchen aspirin 325 MG tablet Take 325 mg by mouth every morning.       . budesonide-formoterol (SYMBICORT) 160-4.5 MCG/ACT inhaler Inhale 2 puffs into the lungs 2 (two) times daily.      . citalopram (CELEXA) 20 MG tablet Take 20 mg by mouth every evening.      . clonazePAM (KLONOPIN) 0.5 MG tablet Take 0.5 mg by mouth daily as needed.      . diltiazem (CARDIZEM CD) 240 MG 24 hr capsule Take 240 mg by mouth 2 (two) times daily.      . furosemide (LASIX) 20 MG tablet Take 20 mg by mouth daily.       Marland Kitchen lisinopril (PRINIVIL,ZESTRIL) 5 MG tablet Take 5 mg by mouth daily.      . metFORMIN (GLUCOPHAGE) 500 MG tablet Take 500 mg by mouth 3 (three) times daily.      . metoprolol (LOPRESSOR) 100 MG tablet Take 1 tablet (100 mg total) by mouth 2 (two) times daily.  60 tablet  6  . tiotropium (SPIRIVA) 18 MCG inhalation capsule Place 18 mcg into inhaler and inhale every morning.      . warfarin (COUMADIN) 5 MG tablet Take 1 tablet (5 mg total) by mouth as directed.  60 tablet  2  . zolpidem (AMBIEN) 10 MG tablet Take 10 mg by mouth at bedtime as needed.        No Known Allergies  History   Social History  . Marital Status: Single    Spouse Name: N/A    Number of Children: N/A  . Years of Education: N/A   Occupational History  . Not on file.   Social History Main Topics  . Smoking status: Current Every Day Smoker -- 1.0 packs/day for 47 years    Types: Cigarettes  . Smokeless tobacco: Never Used   Comment: encouraged to quit today  . Alcohol Use: Yes     formerly very heavy ETOH, quit 08/23/11  . Drug Use: No  . Sexually Active: Not on file   Other Topics Concern  . 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 Diabetes    . Hypertension      ROS- All systems are  reviewed and negative except as per the HPI above  Physical Exam: Filed Vitals:   10/15/11 0913  BP: 118/72  Pulse: 80  Height: 6\' 1"  (1.854 m)  Weight: 245 lb (111.131 kg)    GEN- The patient is overweight appearing, alert and oriented x 3 today.   Head- normocephalic, atraumatic Eyes-  Sclera clear, conjunctiva pink Ears- hearing intact Oropharynx- clear Neck- supple, no JVP Lymph- no cervical lymphadenopathy Lungs- Clear to ausculation bilaterally, normal work of breathing Heart- irregular rate and rhythm, no murmurs, rubs or gallops, PMI not laterally displaced GI- soft, NT, ND, + BS Extremities- no clubbing, cyanosis, or edema MS- no significant deformity or atrophy Skin- no rash or lesion Psych- euthymic mood, full affect Neuro- strength and sensation are intact  EKG today reveals typical appearing atrial flutter, V rate 80 bpm, nonspecific ST/T changes  Echo and myoview reviewed   Assessment and Plan:

## 2011-11-06 NOTE — Preoperative (Addendum)
Beta Blockers   Metoprolol 100 mgs taken @  6am     On 11/06/11

## 2011-11-06 NOTE — Op Note (Signed)
PREPROCEDURE DIAGNOSIS: Typical-appearing atrial flutter.   POSTPROCEDURE DIAGNOSIS: Isthmus-dependent right atrial flutter.   PROCEDURES:  1. Comprehensive EP study.  2. Coronary sinus pacing and recording.  3. Mapping of SVT.  4. Ablation of SVT.   INTRODUCTION:  Chase Herrera is a 61 y.o. male with a history of symptomatic typical-appearing atrial flutter. He presents today for EP study and radiofrequency ablation.   DESCRIPTION OF THE PROCEDURE: Informed written consent was obtained, and the patient was brought to the electrophysiology lab in the fasting state. The patient was then adequately sedated with anesthesia as outlined in the anesthesia report. The patient's right groin was prepped and draped in the usual sterile fashion by the EP lab staff. Using a percutaneous Seldinger technique a 6, 7, and 8-French  hemostasis sheaths were placed into the right common femoral vein. A 7- [employer] decapolar coronary sinus catheter was introduced through the right common femoral vein and advanced into the coronary sinus for recording and pacing from this location. A 6-French quadripolar Josephson catheter was introduced through the right common femoral vein and advanced into the right ventricle for recording and pacing. This catheter was then pulled back to the His bundle location. The patient presented to the electrophysiology lab in atrial flutter. The surface electrocardiogram was consistent with typical atrial flutter. The coronary sinus activation sequence was proximal to distal and suggestive of right atrial flutter.  The atrial flutter cycle length was 260 msec. Atrial entrainment mapping was then performed. When pacing from the left atrium, a long post pacing interval was observed. With entrainment mapping from the cavotricuspid isthmus, the post pacing interval was equal to the tachycardia cycle length and therefore suggestive of isthmus-dependent right atrial flutter. The patient's  QRS duration measured 102 msec with an RR interval of 1066 msec and an HV interval of 41 msec. I elected to perform cavotricuspid isthmus ablation.   A [employer] 7-French  10mm ablation catheter was introduced through the right common femoral vein and advanced into the right atrium. Mapping of the cavotricuspid isthmus  was performed which revealed a standard isthmus. A series of seven radiofrequency applications were delivered along the cavotricuspid isthmus with a target temperature of 60 degrees with power of 70 watts. During the 4th radiofrequency ablation lesion, the tachycardia slowed and then terminated. The patient remained in sinus rhythm thereafter. An additional 3 radiofrequency applications were then delivered with similar parameters in order to obtain complete bidirectional cavotricuspid isthmus block.   Following ablation, differential atrial pacing was performed from the low lateral right atrium with a Duodecapolar halo catheter in place. This confirmed complete bidirectional cavotricuspid isthmus  block with a stimulus to earliest atrial activation recorded bidirectional across the isthmus measuring 170 msec. The patient was observed for 20 minutes without return of conduction through the  isthmus.  Following ablation, the AH interval measured 131 msec with an HV interval of 52 msec. Rapid atrial pacing was performed, which revealed an AV Wenckebach cycle length of 400 msec with no evidence of  PR greater than RR and no tachycardias induced when pacing down to a cycle length of 250 msec. Ventricular pacing was then performed which revealed VA dissociation when pacing at a cycle length of 600 msec.  The procedure was therefore considered completed. All catheters were removed, and the sheaths were aspirated and flushed. The sheaths were removed and hemostasis was assured. There were no early apparent complications.   CONCLUSIONS:  1. Isthmus-dependent right atrial flutter upon  presentation, successfully ablated along the usual cavotricuspid isthmus.  2. Complete bidirectional cavotricuspid isthmus block achieved.  3. No inducible arrhythmias following ablation.  4. No early apparent complications.  Fayrene Fearing Noelly Lasseigne,MD 11/06/2011 2:29 PM

## 2011-11-06 NOTE — Anesthesia Procedure Notes (Signed)
Procedure Name: LMA Insertion Date/Time: 11/06/2011 1:05 PM Performed by: Tyrone Nine Pre-anesthesia Checklist: Suction available, Timeout performed, Emergency Drugs available, Patient identified and Patient being monitored Patient Re-evaluated:Patient Re-evaluated prior to inductionOxygen Delivery Method: Circle system utilized Preoxygenation: Pre-oxygenation with 100% oxygen Intubation Type: IV induction Ventilation: Mask ventilation without difficulty LMA: LMA with gastric port inserted LMA Size: 5.0 Number of attempts: 1 Placement Confirmation: positive ETCO2 Tube secured with: Tape Dental Injury: Teeth and Oropharynx as per pre-operative assessment

## 2011-11-06 NOTE — Transfer of Care (Signed)
Immediate Anesthesia Transfer of Care Note  Patient: Chase Herrera  Procedure(s) Performed: Procedure(s) (LRB) with comments: ATRIAL FLUTTER ABLATION (N/A)  Patient Location: PACU  Anesthesia Type: General  Level of Consciousness: awake, alert , oriented and patient cooperative  Airway & Oxygen Therapy: Patient Spontanous Breathing and Patient connected to nasal cannula oxygen  Post-op Assessment: Report given to PACU RN and Post -op Vital signs reviewed and stable  Post vital signs: Reviewed and stable  Complications: No apparent anesthesia complications

## 2011-11-06 NOTE — Anesthesia Preprocedure Evaluation (Addendum)
Anesthesia Evaluation  Patient identified by MRN, date of birth, ID band Patient awake    Reviewed: Allergy & Precautions, H&P , NPO status , Patient's Chart, lab work & pertinent test results, reviewed documented beta blocker date and time   Airway Mallampati: II TM Distance: >3 FB     Dental  (+) Missing, Loose and Dental Advisory Given   Pulmonary COPD COPD inhaler,          Cardiovascular hypertension, Pt. on medications and Pt. on home beta blockers + DOE + dysrhythmias Atrial Fibrillation Rhythm:Irregular Rate:Normal     Neuro/Psych    GI/Hepatic Neg liver ROS,   Endo/Other  diabetes, Poorly Controlled, Type 2, Oral Hypoglycemic Agents  Renal/GU negative Renal ROS     Musculoskeletal  (+) Arthritis -, Osteoarthritis,    Abdominal   Peds  Hematology   Anesthesia Other Findings Pt with multiple loose carious teeth  Reproductive/Obstetrics                         Anesthesia Physical Anesthesia Plan  ASA: III  Anesthesia Plan: General   Post-op Pain Management:    Induction: Intravenous  Airway Management Planned: LMA  Additional Equipment:   Intra-op Plan:   Post-operative Plan: Extubation in OR  Informed Consent: I have reviewed the patients History and Physical, chart, labs and discussed the procedure including the risks, benefits and alternatives for the proposed anesthesia with the patient or authorized representative who has indicated his/her understanding and acceptance.   Dental advisory given  Plan Discussed with: CRNA and Anesthesiologist  Anesthesia Plan Comments:         Anesthesia Quick Evaluation

## 2011-11-06 NOTE — Anesthesia Postprocedure Evaluation (Signed)
Anesthesia Post Note  Patient: Chase Herrera  Procedure(s) Performed: Procedure(s) (LRB): ATRIAL FLUTTER ABLATION (N/A)  Anesthesia type: general  Patient location: PACU  Post pain: Pain level controlled  Post assessment: Patient's Cardiovascular Status Stable  Last Vitals:  Filed Vitals:   11/06/11 0814  BP: 117/80  Pulse: 92  Temp: 36.1 C  Resp: 20    Post vital signs: Reviewed and stable  Level of consciousness: sedated  Complications: No apparent anesthesia complications

## 2011-11-06 NOTE — Interval H&P Note (Signed)
History and Physical Interval Note:  11/06/2011 12:56 PM  Chase Herrera  has presented today for surgery, with the diagnosis of aflutter  The various methods of treatment have been discussed with the patient and family. After consideration of risks, benefits and other options for treatment, the patient has consented to  Procedure(s) (LRB) with comments: ATRIAL FLUTTER ABLATION (N/A) as a surgical intervention .  The patient's history has been reviewed, patient examined, no change in status, stable for surgery.  I have reviewed the patient's chart and labs.  Questions were answered to the patient's satisfaction.     Hillis Range

## 2011-11-06 NOTE — Progress Notes (Signed)
ANTICOAGULATION CONSULT NOTE - Initial Consult  Pharmacy Consult for Coumadin Indication: A. Flutter  No Known Allergies  Patient Measurements: Height: 6\' 1"  (185.4 cm) Weight: 245 lb (111.131 kg) IBW/kg (Calculated) : 79.9   Vital Signs: Temp: 97.5 F (36.4 C) (10/15 1614) Temp src: Oral (10/15 1614) BP: 121/69 mmHg (10/15 1614) Pulse Rate: 73  (10/15 1614)  Labs:  [facility] 11/06/11 0823  HGB 16.0  HCT 47.0  PLT 180  APTT --  LABPROT 22.1*  INR 2.03*  HEPARINUNFRC --  CREATININE --  CKTOTAL --  CKMB --  TROPONINI --    Estimated Creatinine Clearance: 67.9 ml/min (by C-G formula based on Cr of 1.53).   Medical History: Past Medical History  Diagnosis Date  . Atrial flutter   . Alcohol abuse     quit 08/23/11  . HTN (hypertension)   . Diabetes mellitus   . Cardiomyopathy     EF 45-50%, 2-D echo, 2009; 55-60%, by dobutamine stress echocardiogram, 5/13  . COPD (chronic obstructive pulmonary disease)     Medications:  Scheduled:    . albuterol  2.5 mg Nebulization Once  . bupivacaine      . citalopram  20 mg Oral QPM  . furosemide  20 mg Oral Daily  . lisinopril  5 mg Oral Daily  . metFORMIN  500 mg Oral TID  . sodium chloride  3 mL Intravenous Q12H  . warfarin  10 mg Oral Daily  . warfarin  10 mg Oral ONCE-1800  . Warfarin - Pharmacist Dosing Inpatient   Does not apply q1800  . DISCONTD: albuterol  2 puff Inhalation Q6H    Assessment: 61 yr old male with newly diagnosed CHF and a.flutter presented for EP study and ablation.  To resume home coumadin after ablation. INR 2.03  Home dose of coumadin is 10 mg daily.  Goal of Therapy:  INR 2-3  Plan:  Pt to resume home coumadin dose of 10 mg daily with INR goal of 2-3. Will order 10 mg tonight and daily INR.  Eugene Garnet 11/06/2011,4:57 PM

## 2011-11-07 ENCOUNTER — Encounter (HOSPITAL_COMMUNITY): Payer: Self-pay | Admitting: *Deleted

## 2011-11-07 DIAGNOSIS — J449 Chronic obstructive pulmonary disease, unspecified: Secondary | ICD-10-CM | POA: Diagnosis not present

## 2011-11-07 DIAGNOSIS — I471 Supraventricular tachycardia: Secondary | ICD-10-CM | POA: Diagnosis not present

## 2011-11-07 DIAGNOSIS — E119 Type 2 diabetes mellitus without complications: Secondary | ICD-10-CM | POA: Diagnosis not present

## 2011-11-07 DIAGNOSIS — I4892 Unspecified atrial flutter: Secondary | ICD-10-CM | POA: Diagnosis not present

## 2011-11-07 LAB — GLUCOSE, CAPILLARY: Glucose-Capillary: 156 mg/dL — ABNORMAL HIGH (ref 70–99)

## 2011-11-07 LAB — PROTIME-INR
INR: 2.2 — ABNORMAL HIGH (ref 0.00–1.49)
Prothrombin Time: 23.5 seconds — ABNORMAL HIGH (ref 11.6–15.2)

## 2011-11-07 MED ORDER — OFF THE BEAT BOOK
Freq: Once | Status: DC
  Filled 2011-11-07: qty 1

## 2011-11-07 MED ORDER — CARVEDILOL 6.25 MG PO TABS: 6.2500 mg | ORAL_TABLET | Freq: Two times a day (BID) | ORAL | Status: DC

## 2011-11-07 NOTE — Discharge Summary (Signed)
ELECTROPHYSIOLOGY PROCEDURE DISCHARGE SUMMARY    Patient ID: Chase Herrera,  MRN: [id number], DOB/AGE: 09/03/1950 61 y.o.  Admit date: 11/06/2011 Discharge date: 11/07/2011  Primary Care Physician: Fara Chute, MD Primary Cardiologist: Simona Huh, MD Electrophysiologist: Hillis Range, MD  Primary Discharge Diagnosis:  Atrial flutter status post ablation this admission  Secondary Discharge Diagnosis:  1.  Hypertension 2.  Diabetes 3.  Cardiomyopathy- EF 25-30% by echo 08-2011 4.  COPD  Procedures This Admission:  1.  Electrophysiology study and radiofrequency catheter ablation of atrial flutter on 11-06-2011 by Dr Johney Frame.  This study demonstrated isthmus dependent right atrial flutter upon presentation which was successfully ablated along the usual cavotricuspid isthmus.  Complete bidirectional block was achieved.  There were no inducible arrhythmias following ablation and no early apparent complications.  Brief HPI: Chase Herrera is a 61 y.o. male with a h/o recently diagnosed CHF and atrial flutter who was referred to Dr Johney Frame in the outpatient setting for EP consultation. He has a h/o heavy ETOH. He reports that in 2009, he presented with sustained tachycardia. I have reviewed Dr McDowell's note from 2009 which revealed that he had atrial flutter (and possibly also atrial fibrillation) at that time. His EF was 45-50%. Ablation was encouraged however he refused. He did not follow-up and continued to drink heavily. His family recently became involved and forced him into rehab 08/23/11. While receiving therapy, he developed progressive CHF symptoms and was therefore admitted to [facility]. Echo 09/03/11 revealed EF EF 25-30% with severe LV dysfunction and LA size 46mm and no significant valvular disease.  He presented with atrial flutter with RVR. He has been treated with medical therapy, but remains in atrial flutter. He reports feeling "fatigued" and has decreased exercise  tolerance with his atrial flutter. He has stopped drinking ETOH. He has occasional "fluttering" but no sustained racing. He requires 2 pillows due to orthopnea.  Risks, benefits, and alternatives to ablation were reviewed with the patient who wished to proceed.   Hospital Course:  The patient was admitted and underwent ablation of atrial flutter on 11-06-2011 by Dr Johney Frame with details as outlined above.  He was monitored on telemetry overnight which demonstrated sinus rhythm.  His groins were without hematoma or ecchymosis.  Dr Johney Frame examined the patient and considered him stable for discharge to home. The patient has follow up scheduled with the [facility] clinic as well as Dr Johney Frame.  The [facility] office will contact the patient and make an appointment with Dr Diona Browner in 2 months.   Discharge Vitals: Blood pressure 114/65, pulse 83, temperature 97.7 F (36.5 C), temperature source Oral, resp. rate 16, height 6\' 1"  (1.854 m), weight 249 lb 12.5 oz (113.3 kg), SpO2 94.00%.    Labs:   Lab Results  Component Value Date   WBC 8.9 11/06/2011   HGB 16.0 11/06/2011   HCT 47.0 11/06/2011   MCV 98.1 11/06/2011   PLT 180 11/06/2011     Discharge Medications:    Medication List     As of 11/07/2011  2:59 PM    STOP taking these medications         diltiazem 240 MG 24 hr capsule   Commonly known as: CARDIZEM CD      metoprolol 100 MG tablet   Commonly known as: LOPRESSOR      TAKE these medications         albuterol 108 (90 BASE) MCG/ACT inhaler   Commonly known as: PROVENTIL HFA;VENTOLIN  HFA   Inhale 2 puffs into the lungs every 6 (six) hours as needed. For shortness of breath      AMBIEN 10 MG tablet   Generic drug: zolpidem   Take 10 mg by mouth at bedtime as needed. For sleep      carvedilol 6.25 MG tablet   Commonly known as: COREG   Take 1 tablet (6.25 mg total) by mouth 2 (two) times daily with a meal.      citalopram 20 MG tablet   Commonly known as: CELEXA   Take 20  mg by mouth every evening.      furosemide 20 MG tablet   Commonly known as: LASIX   Take 20 mg by mouth daily.      lisinopril 5 MG tablet   Commonly known as: PRINIVIL,ZESTRIL   Take 5 mg by mouth daily.      metFORMIN 500 MG tablet   Commonly known as: GLUCOPHAGE   Take 500 mg by mouth 3 (three) times daily.      warfarin 10 MG tablet   Commonly known as: COUMADIN   Take 10 mg by mouth daily.         Disposition:  Discharge Orders    Future Appointments: Provider: Department: Dept Phone: Center:   11/09/2011 2:40 PM [facility] [phone] [facility]   12/10/2011 11:30 AM Hillis Range, MD [facility] [phone] [facility]   01/07/2012 2:00 PM Jonelle Sidle, MD [facility] [phone] [facility]     Follow-up Information    Follow up with [facility]. On 11/09/2011. (2:40 PM)    Contact information:   [address] [place] [address]       Follow up with Hillis Range, MD. On 12/10/2011. (11:30 AM)    Contact information:   [address] [place] [address] [phone]       Follow up with Nona Dell, MD. On 01/07/2012. (2:00 PM)    Contact information:   [address] [place] [address] [phone]          Duration of Discharge Encounter: Greater than 30 minutes including physician time.  Signed, Hillis Range, RN, BSN 11/07/2011, 2:59 PM   Hillis Range, MD

## 2011-11-07 NOTE — Progress Notes (Signed)
   ELECTROPHYSIOLOGY ROUNDING NOTE    Patient Name: Chase Herrera Date of Encounter: 11-07-2011    SUBJECTIVE: Patient feels well.  No chest pain or shortness of breath.  S/p ablation of atrial flutter 11-06-2011.   TELEMETRY: Reviewed telemetry pt in sinus rhythm. Filed Vitals:   11/06/11 1954 11/06/11 2000 11/07/11 0024 11/07/11 0400  BP: 131/66 122/93 107/51 123/73  Pulse: 81 82 88 83  Temp: 97.9 F (36.6 C)  98 F (36.7 C) 97.9 F (36.6 C)  TempSrc: Oral  Oral Oral  Resp: 18 16 22 16   Height:      Weight:   249 lb 12.5 oz (113.3 kg)   SpO2: 92% 92% 95% 93%    Intake/Output Summary (Last 24 hours) at 11/07/11 0726 Last data filed at 11/07/11 0411  Gross per 24 hour  Intake   1130 ml  Output    570 ml  Net    560 ml   GEN- The patient is well appearing, alert and oriented x 3 today.   Head- normocephalic, atraumatic Eyes-  Sclera clear, conjunctiva pink Ears- hearing intact Oropharynx- clear Neck- supple, no JVP Lymph- no cervical lymphadenopathy Lungs- prolonged expiratory phase, normal work of breathing Heart- Regular rate and rhythm, no murmurs, rubs or gallops, PMI not laterally displaced GI- soft, NT, ND, + BS Extremities- no clubbing, cyanosis, or edema, no hematoma/bruit   LABS: CBC:  Basename 11/06/11 0823  WBC 8.9  NEUTROABS --  HGB 16.0  HCT 47.0  MCV 98.1  PLT 180   INR: 2.20   Assessment and Plan:  1. Atrial flutter- doing well s/p ablation Stop diltiazem Continue coumadin until I see again in 4 weeks.  2. Chronic systolic dysfunction Possibly tachycardia mediated Will need to reassess EF in 3 months Switch metoprolol to coreg 6.25mg  BID  3. Obesity Weight loss advised  4. Tobacco/ ETOH- cessation strongly advised  DC to home today. Follow-up with me in 4 weeks

## 2011-11-09 ENCOUNTER — Ambulatory Visit (INDEPENDENT_AMBULATORY_CARE_PROVIDER_SITE_OTHER): Payer: Medicare Other | Admitting: *Deleted

## 2011-11-09 DIAGNOSIS — Z7901 Long term (current) use of anticoagulants: Secondary | ICD-10-CM | POA: Diagnosis not present

## 2011-11-09 DIAGNOSIS — I4892 Unspecified atrial flutter: Secondary | ICD-10-CM

## 2011-11-09 DIAGNOSIS — R079 Chest pain, unspecified: Secondary | ICD-10-CM | POA: Diagnosis not present

## 2011-11-09 LAB — POCT INR: INR: 2.6

## 2011-11-13 ENCOUNTER — Encounter: Payer: Self-pay | Admitting: Internal Medicine

## 2011-11-16 ENCOUNTER — Ambulatory Visit (INDEPENDENT_AMBULATORY_CARE_PROVIDER_SITE_OTHER): Payer: Medicare Other | Admitting: *Deleted

## 2011-11-16 DIAGNOSIS — Z7901 Long term (current) use of anticoagulants: Secondary | ICD-10-CM | POA: Diagnosis not present

## 2011-11-16 DIAGNOSIS — I4892 Unspecified atrial flutter: Secondary | ICD-10-CM | POA: Diagnosis not present

## 2011-11-16 LAB — POCT INR: INR: 2.7

## 2011-11-27 ENCOUNTER — Ambulatory Visit (INDEPENDENT_AMBULATORY_CARE_PROVIDER_SITE_OTHER): Payer: Medicare Other | Admitting: *Deleted

## 2011-11-27 DIAGNOSIS — I4892 Unspecified atrial flutter: Secondary | ICD-10-CM

## 2011-11-27 DIAGNOSIS — Z7901 Long term (current) use of anticoagulants: Secondary | ICD-10-CM | POA: Diagnosis not present

## 2011-11-27 LAB — POCT INR: INR: 1.5

## 2011-12-04 ENCOUNTER — Ambulatory Visit (INDEPENDENT_AMBULATORY_CARE_PROVIDER_SITE_OTHER): Payer: Medicare Other | Admitting: *Deleted

## 2011-12-04 DIAGNOSIS — Z7901 Long term (current) use of anticoagulants: Secondary | ICD-10-CM | POA: Diagnosis not present

## 2011-12-04 DIAGNOSIS — I4892 Unspecified atrial flutter: Secondary | ICD-10-CM

## 2011-12-04 LAB — POCT INR: INR: 2.7

## 2011-12-10 ENCOUNTER — Ambulatory Visit (INDEPENDENT_AMBULATORY_CARE_PROVIDER_SITE_OTHER): Payer: Medicare Other | Admitting: Internal Medicine

## 2011-12-10 ENCOUNTER — Encounter: Payer: Self-pay | Admitting: Internal Medicine

## 2011-12-10 VITALS — BP 132/85 | HR 76 | Ht 73.0 in | Wt 258.0 lb

## 2011-12-10 DIAGNOSIS — F17200 Nicotine dependence, unspecified, uncomplicated: Secondary | ICD-10-CM

## 2011-12-10 DIAGNOSIS — I4892 Unspecified atrial flutter: Secondary | ICD-10-CM | POA: Diagnosis not present

## 2011-12-10 DIAGNOSIS — Z720 Tobacco use: Secondary | ICD-10-CM

## 2011-12-10 DIAGNOSIS — R0602 Shortness of breath: Secondary | ICD-10-CM | POA: Diagnosis not present

## 2011-12-10 DIAGNOSIS — I428 Other cardiomyopathies: Secondary | ICD-10-CM | POA: Diagnosis not present

## 2011-12-10 NOTE — Assessment & Plan Note (Signed)
Possibly tachycardia/ ETOh mediated He has quit ETOH Reassess EF in [place] in 3 months

## 2011-12-10 NOTE — Assessment & Plan Note (Signed)
Cessation advised.  He is not ready to quit unfortunately.    He will follow-up with Dr Diona Browner in 4-6 weeks.  I will see as needed going forward.

## 2011-12-10 NOTE — Progress Notes (Signed)
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  Since his recently atrial flutter ablation, the patient reports doing very well.  He denies procedure related complications.  Today, he denies symptoms of palpitations, chest pain,  lower extremity edema, dizziness, presyncope, or syncope.  The patient is otherwise without complaint today.  His SOB is stable.  Unfortunately, he continues to smoke.  Past Medical History  Diagnosis Date  . Atrial flutter     s/p CTI ablation by Dr Chase Herrera 10/13  . Alcohol abuse     quit 08/23/11  . HTN (hypertension)   . Diabetes mellitus   . Cardiomyopathy     EF 45-50%, 2-D echo, 2009; 55-60%, by dobutamine stress echocardiogram, 5/13  . COPD (chronic obstructive pulmonary disease)   . Depression   . Shortness of breath   . Arthritis     hips   Past Surgical History  Procedure Date  . Tonsillectomy   . Appendectomy   . Atrial ablation surgery 11/06/2011    CTI ablation for atrial flutter by Dr Chase Herrera  . Cataracts     Current Outpatient Prescriptions  Medication Sig Dispense Refill  . albuterol (PROVENTIL HFA;VENTOLIN HFA) 108 (90 BASE) MCG/ACT inhaler Inhale 2 puffs into the lungs every 6 (six) hours as needed. For shortness of breath      . carvedilol (COREG) 6.25 MG tablet Take 1 tablet (6.25 mg total) by mouth 2 (two) times daily with a meal.  60 tablet  6  . citalopram (CELEXA) 20 MG tablet Take 20 mg by mouth every evening.      . furosemide (LASIX) 20 MG tablet Take 20 mg by mouth daily.       Marland Kitchen lisinopril (PRINIVIL,ZESTRIL) 5 MG tablet Take 5 mg by mouth daily.      . metFORMIN (GLUCOPHAGE) 500 MG tablet Take 500 mg by mouth 3 (three) times daily.      Marland Kitchen warfarin (COUMADIN) 10 MG tablet Take 10 mg by mouth daily.        Physical Exam: Filed Vitals:   12/10/11 1147  BP: 132/85  Pulse: 76  Height: 6\' 1"  (1.854 m)  Weight: 258 lb  (117.028 kg)    GEN- The patient is well appearing, alert and oriented x 3 today.   Head- normocephalic, atraumatic Eyes-  Sclera clear, conjunctiva pink Ears- hearing intact Oropharynx- clear Lungs- Clear to ausculation bilaterally, normal work of breathing Heart- Regular rate and rhythm, no murmurs, rubs or gallops, PMI not laterally displaced GI- soft, NT, ND, + BS Extremities- no clubbing, cyanosis, or edema  ekg today reveals sinus rhythm 76 bpm, nonspecific St/T changes  Assessment and Plan:

## 2011-12-10 NOTE — Patient Instructions (Addendum)
Your physician recommends that you keep your scheduled follow up appointment with Dr Diona Browner   Your physician has recommended you make the following change in your medication:  1) Stop Coumadin

## 2011-12-10 NOTE — Assessment & Plan Note (Signed)
Doing well s/p ablation As atrial fibrillation has not been documented, we will stop coumadin and follow clinically

## 2011-12-11 DIAGNOSIS — IMO0002 Reserved for concepts with insufficient information to code with codable children: Secondary | ICD-10-CM | POA: Diagnosis not present

## 2011-12-11 DIAGNOSIS — J449 Chronic obstructive pulmonary disease, unspecified: Secondary | ICD-10-CM | POA: Diagnosis not present

## 2011-12-26 DIAGNOSIS — R0989 Other specified symptoms and signs involving the circulatory and respiratory systems: Secondary | ICD-10-CM | POA: Diagnosis not present

## 2011-12-26 DIAGNOSIS — E119 Type 2 diabetes mellitus without complications: Secondary | ICD-10-CM | POA: Diagnosis not present

## 2011-12-26 DIAGNOSIS — I509 Heart failure, unspecified: Secondary | ICD-10-CM | POA: Diagnosis not present

## 2011-12-26 DIAGNOSIS — I10 Essential (primary) hypertension: Secondary | ICD-10-CM | POA: Diagnosis not present

## 2011-12-26 DIAGNOSIS — J441 Chronic obstructive pulmonary disease with (acute) exacerbation: Secondary | ICD-10-CM | POA: Diagnosis not present

## 2011-12-26 DIAGNOSIS — Z79899 Other long term (current) drug therapy: Secondary | ICD-10-CM | POA: Diagnosis not present

## 2011-12-26 DIAGNOSIS — F17200 Nicotine dependence, unspecified, uncomplicated: Secondary | ICD-10-CM | POA: Diagnosis not present

## 2011-12-27 DIAGNOSIS — J441 Chronic obstructive pulmonary disease with (acute) exacerbation: Secondary | ICD-10-CM | POA: Diagnosis not present

## 2011-12-27 DIAGNOSIS — R0989 Other specified symptoms and signs involving the circulatory and respiratory systems: Secondary | ICD-10-CM | POA: Diagnosis not present

## 2012-01-04 ENCOUNTER — Ambulatory Visit: Payer: Self-pay | Admitting: *Deleted

## 2012-01-04 DIAGNOSIS — I4892 Unspecified atrial flutter: Secondary | ICD-10-CM

## 2012-01-04 DIAGNOSIS — Z7901 Long term (current) use of anticoagulants: Secondary | ICD-10-CM

## 2012-01-06 ENCOUNTER — Encounter: Payer: Self-pay | Admitting: Cardiology

## 2012-01-06 DIAGNOSIS — I4892 Unspecified atrial flutter: Secondary | ICD-10-CM | POA: Insufficient documentation

## 2012-01-07 ENCOUNTER — Encounter: Payer: Self-pay | Admitting: Cardiology

## 2012-01-07 ENCOUNTER — Ambulatory Visit (INDEPENDENT_AMBULATORY_CARE_PROVIDER_SITE_OTHER): Payer: Medicare Other | Admitting: Cardiology

## 2012-01-07 VITALS — BP 152/88 | HR 95 | Ht 73.0 in | Wt 265.0 lb

## 2012-01-07 DIAGNOSIS — I10 Essential (primary) hypertension: Secondary | ICD-10-CM

## 2012-01-07 DIAGNOSIS — F17200 Nicotine dependence, unspecified, uncomplicated: Secondary | ICD-10-CM | POA: Diagnosis not present

## 2012-01-07 DIAGNOSIS — I428 Other cardiomyopathies: Secondary | ICD-10-CM

## 2012-01-07 DIAGNOSIS — I4892 Unspecified atrial flutter: Secondary | ICD-10-CM | POA: Diagnosis not present

## 2012-01-07 DIAGNOSIS — I429 Cardiomyopathy, unspecified: Secondary | ICD-10-CM

## 2012-01-07 DIAGNOSIS — Z720 Tobacco use: Secondary | ICD-10-CM

## 2012-01-07 NOTE — Assessment & Plan Note (Signed)
Blood pressure is increased. Coreg is being advanced as noted.

## 2012-01-07 NOTE — Assessment & Plan Note (Signed)
Maintaining sinus rhythm status post ablation by Dr. Johney Frame. Coumadin was discontinued by Dr. Johney Frame, no clear evidence of atrial fibrillation has been noted as yet.

## 2012-01-07 NOTE — Assessment & Plan Note (Signed)
We addressed smoking cessation strategies today. He has not been able to quit so far.

## 2012-01-07 NOTE — Assessment & Plan Note (Signed)
Will increase Coreg to 9.375 mg twice a day for two weeks, and if he tolerates this, further advance to 12.5 mg twice daily. Also followup echocardiogram to reassess LV function status post ablation, last assessment of LVEF was in August. Continue ACE inhibitor and low-dose Lasix.

## 2012-01-07 NOTE — Progress Notes (Signed)
Clinical Summary Chase Herrera is a 61 y.o.male presenting for followup. I have not seen him in the office since 2009. Recent interval history reviewed including diagnosis of cardiomyopathy in the setting of atrial flutter, now status post ablation by Dr. Johney Frame. Last assessment of LV function was in August.  ECG today shows sinus rhythm with left atrial enlargement, nonspecific ST changes. He remained short of breath, although continues to smoke cigarettes and has significant COPD at baseline. He has not had a followup echocardiogram as yet.  He reports compliance with his medications. We reviewed his ECG today, discussed his overall condition, rationale for medical therapy, and followup testing.   No Known Allergies  Current Outpatient Prescriptions  Medication Sig Dispense Refill  . albuterol (PROVENTIL HFA;VENTOLIN HFA) 108 (90 BASE) MCG/ACT inhaler Inhale 2 puffs into the lungs every 6 (six) hours as needed. For shortness of breath      . carvedilol (COREG) 6.25 MG tablet Take 9.375 mg by mouth 2 (two) times daily with a meal.      . citalopram (CELEXA) 20 MG tablet Take 20 mg by mouth every evening.      . furosemide (LASIX) 20 MG tablet Take 20 mg by mouth daily.       Marland Kitchen lisinopril (PRINIVIL,ZESTRIL) 5 MG tablet Take 5 mg by mouth daily.      . metFORMIN (GLUCOPHAGE) 500 MG tablet Take 500 mg by mouth 3 (three) times daily.        Past Medical History  Diagnosis Date  . Atrial flutter     s/p CTI ablation by Dr Johney Frame 10/13  . Alcohol abuse     quit 08/23/11  . Essential hypertension, benign   . Type 2 diabetes mellitus   . Cardiomyopathy     LVEF 25-30% 8/13, previously normal by dobutamine echo 5/13  . COPD (chronic obstructive pulmonary disease)   . Depression   . Arthritis     Social History Chase Herrera reports that he has been smoking Cigarettes.  He has a 47 pack-year smoking history. He has never used smokeless tobacco. Chase Herrera reports that he drinks  alcohol.  Review of Systems No syncope. Has occasional brief palpitation. No orthopnea or PND. Otherwise negative,  Physical Examination Filed Vitals:   01/07/12 1359  BP: 152/88  Pulse: 95   Filed Weights   01/07/12 1359  Weight: 265 lb (120.203 kg)   No acute distress. HEENT: Conjunctiva and lids normal, oropharynx clear with poor dentition. Neck: Supple, no elevated JVP or carotid bruits, no thyromegaly. Lungs: Decreased breath sounds throughout, nonlabored breathing at rest. Cardiac: Regular rate and rhythm, no S3, no pericardial rub. Abdomen: Soft, nontender, bowel sounds present, no guarding or rebound. Extremities: No pitting edema, distal pulses 1-2+. Skin: Warm and dry. Musculoskeletal: No kyphosis. Neuropsychiatric: Alert and oriented x3, affect grossly appropriate.   Problem List and Plan   Atrial flutter Maintaining sinus rhythm status post ablation by Dr. Johney Frame. Coumadin was discontinued by Dr. Johney Frame, no clear evidence of atrial fibrillation has been noted as yet.  Nonischemic cardiomyopathy Will increase Coreg to 9.375 mg twice a day for two weeks, and if he tolerates this, further advance to 12.5 mg twice daily. Also followup echocardiogram to reassess LV function status post ablation, last assessment of LVEF was in August. Continue ACE inhibitor and low-dose Lasix.  Essential hypertension, benign Blood pressure is increased. Coreg is being advanced as noted.  Tobacco abuse We addressed smoking cessation strategies today. He has  not been able to quit so far.    Jonelle Sidle, M.D., F.A.C.C.

## 2012-01-07 NOTE — Patient Instructions (Addendum)
Your physician recommends that you schedule a follow-up appointment in: 3 months. Your physician has recommended you make the following change in your medication: Please increase your carvedilol to 1&1/2 tablets twice daily. If you are able to tolerate this okay, in 2 weeks, increase to 2 tablets twice daily to equal 12.5 mg twice daily. Please cal our office and we will send a new prescription to your pharmacy at this time. All other medications will remain the same.  Your physician has requested that you have an echocardiogram. Echocardiography is a painless test that uses sound waves to create images of your heart. It provides your doctor with information about the size and shape of your heart and how well your heart's chambers and valves are working. This procedure takes approximately one hour. There are no restrictions for this procedure.

## 2012-01-10 DIAGNOSIS — E119 Type 2 diabetes mellitus without complications: Secondary | ICD-10-CM | POA: Diagnosis present

## 2012-01-10 DIAGNOSIS — R0602 Shortness of breath: Secondary | ICD-10-CM | POA: Diagnosis not present

## 2012-01-10 DIAGNOSIS — Z79899 Other long term (current) drug therapy: Secondary | ICD-10-CM | POA: Diagnosis not present

## 2012-01-10 DIAGNOSIS — F17200 Nicotine dependence, unspecified, uncomplicated: Secondary | ICD-10-CM | POA: Diagnosis not present

## 2012-01-10 DIAGNOSIS — J441 Chronic obstructive pulmonary disease with (acute) exacerbation: Secondary | ICD-10-CM | POA: Diagnosis present

## 2012-01-10 DIAGNOSIS — Z87442 Personal history of urinary calculi: Secondary | ICD-10-CM | POA: Diagnosis not present

## 2012-01-10 DIAGNOSIS — R0989 Other specified symptoms and signs involving the circulatory and respiratory systems: Secondary | ICD-10-CM | POA: Diagnosis not present

## 2012-01-10 DIAGNOSIS — E669 Obesity, unspecified: Secondary | ICD-10-CM | POA: Diagnosis present

## 2012-01-10 DIAGNOSIS — I509 Heart failure, unspecified: Secondary | ICD-10-CM | POA: Diagnosis present

## 2012-01-10 DIAGNOSIS — I10 Essential (primary) hypertension: Secondary | ICD-10-CM | POA: Diagnosis present

## 2012-01-10 DIAGNOSIS — Z6834 Body mass index (BMI) 34.0-34.9, adult: Secondary | ICD-10-CM | POA: Diagnosis not present

## 2012-01-10 DIAGNOSIS — J9600 Acute respiratory failure, unspecified whether with hypoxia or hypercapnia: Secondary | ICD-10-CM | POA: Diagnosis not present

## 2012-01-17 DIAGNOSIS — R0602 Shortness of breath: Secondary | ICD-10-CM | POA: Diagnosis not present

## 2012-01-20 DIAGNOSIS — Z2821 Immunization not carried out because of patient refusal: Secondary | ICD-10-CM | POA: Diagnosis not present

## 2012-01-20 DIAGNOSIS — R0602 Shortness of breath: Secondary | ICD-10-CM | POA: Diagnosis not present

## 2012-01-20 DIAGNOSIS — F17200 Nicotine dependence, unspecified, uncomplicated: Secondary | ICD-10-CM | POA: Diagnosis not present

## 2012-01-20 DIAGNOSIS — R0989 Other specified symptoms and signs involving the circulatory and respiratory systems: Secondary | ICD-10-CM | POA: Diagnosis not present

## 2012-01-20 DIAGNOSIS — I4891 Unspecified atrial fibrillation: Secondary | ICD-10-CM | POA: Diagnosis not present

## 2012-01-20 DIAGNOSIS — J449 Chronic obstructive pulmonary disease, unspecified: Secondary | ICD-10-CM | POA: Diagnosis not present

## 2012-01-20 DIAGNOSIS — J209 Acute bronchitis, unspecified: Secondary | ICD-10-CM | POA: Diagnosis not present

## 2012-01-20 DIAGNOSIS — I509 Heart failure, unspecified: Secondary | ICD-10-CM | POA: Diagnosis not present

## 2012-01-20 DIAGNOSIS — I10 Essential (primary) hypertension: Secondary | ICD-10-CM | POA: Diagnosis not present

## 2012-01-20 DIAGNOSIS — Z6834 Body mass index (BMI) 34.0-34.9, adult: Secondary | ICD-10-CM | POA: Diagnosis not present

## 2012-01-20 DIAGNOSIS — E669 Obesity, unspecified: Secondary | ICD-10-CM | POA: Diagnosis not present

## 2012-01-20 DIAGNOSIS — E119 Type 2 diabetes mellitus without complications: Secondary | ICD-10-CM | POA: Diagnosis not present

## 2012-01-20 DIAGNOSIS — R0609 Other forms of dyspnea: Secondary | ICD-10-CM | POA: Diagnosis not present

## 2012-01-20 DIAGNOSIS — J441 Chronic obstructive pulmonary disease with (acute) exacerbation: Secondary | ICD-10-CM | POA: Diagnosis not present

## 2012-01-20 DIAGNOSIS — Z79899 Other long term (current) drug therapy: Secondary | ICD-10-CM | POA: Diagnosis not present

## 2012-01-21 DIAGNOSIS — J449 Chronic obstructive pulmonary disease, unspecified: Secondary | ICD-10-CM | POA: Diagnosis not present

## 2012-01-21 DIAGNOSIS — R0902 Hypoxemia: Secondary | ICD-10-CM | POA: Diagnosis not present

## 2012-01-22 DIAGNOSIS — J441 Chronic obstructive pulmonary disease with (acute) exacerbation: Secondary | ICD-10-CM | POA: Diagnosis not present

## 2012-01-29 DIAGNOSIS — E119 Type 2 diabetes mellitus without complications: Secondary | ICD-10-CM | POA: Diagnosis not present

## 2012-01-29 DIAGNOSIS — I10 Essential (primary) hypertension: Secondary | ICD-10-CM | POA: Diagnosis not present

## 2012-01-29 DIAGNOSIS — F17200 Nicotine dependence, unspecified, uncomplicated: Secondary | ICD-10-CM | POA: Diagnosis not present

## 2012-01-30 ENCOUNTER — Telehealth: Payer: Self-pay | Admitting: Cardiology

## 2012-01-30 MED ORDER — CARVEDILOL 6.25 MG PO TABS: 12.5000 mg | ORAL_TABLET | Freq: Two times a day (BID) | ORAL | Status: DC

## 2012-01-30 NOTE — Telephone Encounter (Signed)
Needs refill on medication has only 2 pills left  [facility]

## 2012-01-30 NOTE — Telephone Encounter (Signed)
Informed pt that Coreg 6.25 was sent to [facility] with a qty of 120.

## 2012-01-31 ENCOUNTER — Other Ambulatory Visit: Payer: Self-pay

## 2012-01-31 ENCOUNTER — Other Ambulatory Visit (INDEPENDENT_AMBULATORY_CARE_PROVIDER_SITE_OTHER): Payer: Medicare Other

## 2012-01-31 DIAGNOSIS — I428 Other cardiomyopathies: Secondary | ICD-10-CM | POA: Diagnosis not present

## 2012-01-31 DIAGNOSIS — I429 Cardiomyopathy, unspecified: Secondary | ICD-10-CM

## 2012-01-31 DIAGNOSIS — I4892 Unspecified atrial flutter: Secondary | ICD-10-CM

## 2012-02-05 ENCOUNTER — Telehealth: Payer: Self-pay | Admitting: *Deleted

## 2012-02-05 NOTE — Telephone Encounter (Signed)
Message copied by Eustace Moore on Tue Feb 05, 2012 11:14 AM ------      Message from: Jonelle Sidle      Created: Fri Feb 01, 2012 11:43 AM       Reviewed. Study shows normalization of LV function since atrial flutter ablation, very good result overall. Continue medical therapy.

## 2012-02-05 NOTE — Telephone Encounter (Signed)
Patient informed. 

## 2012-02-08 ENCOUNTER — Other Ambulatory Visit: Payer: Medicare Other

## 2012-02-08 ENCOUNTER — Ambulatory Visit (INDEPENDENT_AMBULATORY_CARE_PROVIDER_SITE_OTHER): Payer: Medicare Other | Admitting: Emergency Medicine

## 2012-02-08 ENCOUNTER — Encounter: Payer: Self-pay | Admitting: Emergency Medicine

## 2012-02-08 VITALS — BP 140/88 | HR 80 | Temp 98.1°F | Ht 73.0 in | Wt 264.6 lb

## 2012-02-08 DIAGNOSIS — J449 Chronic obstructive pulmonary disease, unspecified: Secondary | ICD-10-CM

## 2012-02-08 NOTE — Assessment & Plan Note (Signed)
Severity unclear. Currently stable on short-acting BD's. I would like to transition to long acting meds but cost is a concern - DuoNebs for now - may be able to get financial assist for long acting meds in the future - a1AT today - PFT next visit - rov next available

## 2012-02-08 NOTE — Progress Notes (Signed)
Subjective:    Patient ID: Chase Herrera, male    DOB: 11-Jul-1950, 62 y.o.   MRN: [id number]  HPI 62 yo man, hx tobacco (~ 90-100 pk-yrs), HTN, DM, A flutter s/p ablation and improved cardiomyopathy post-ablation. Presents today for eval of his dyspnea. Was dx with COPD about 3 yrs ago.  He has had flared recently, was hospitalized at [facility]. He is on Symbicort bid about 2-3 yrs but couldn;t afford. He is on DuoNebs qid. He was on spiriva for a few years also - used unreliably due to cost.   He describes some exertional SOB, worst in the morning. Cough that produces white/clear, worst in the am. Using SABA prn, a few times a week.    Review of Systems  Constitutional: Positive for activity change. Negative for fever and unexpected weight change.  HENT: Positive for congestion and sneezing. Negative for ear pain, nosebleeds, sore throat, rhinorrhea, trouble swallowing, dental problem, postnasal drip and sinus pressure.   Eyes: Negative for redness and itching.  Respiratory: Positive for cough and shortness of breath. Negative for chest tightness and wheezing.   Cardiovascular: Negative for palpitations and leg swelling.  Gastrointestinal: Negative for nausea and vomiting.  Genitourinary: Negative for dysuria.  Musculoskeletal: Negative for joint swelling.  Skin: Negative for rash.  Neurological: Negative for headaches.  Hematological: Does not bruise/bleed easily.  Psychiatric/Behavioral: Negative for dysphoric mood. The patient is nervous/anxious.     Past Medical History  Diagnosis Date  . Atrial flutter     s/p CTI ablation by Dr Johney Frame 10/13  . Alcohol abuse     quit 08/23/11  . Essential hypertension, benign   . Type 2 diabetes mellitus   . Cardiomyopathy     LVEF 25-30% 8/13, previously normal by dobutamine echo 5/13  . COPD (chronic obstructive pulmonary disease)   . Depression   . Arthritis      Family History  Problem Relation Age of Onset  . Diabetes Sister   .  Hypertension Mother   . Hypertension Father   . Hypertension Sister   . Hypertension Sister   . Hypertension Sister   . Heart attack Father   . Cancer Mother     lung  . Pulmonary fibrosis Sister      History   Social History  . Marital Status: Single    Spouse Name: N/A    Number of Children: N/A  . Years of Education: N/A   Occupational History  . 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 Smoking status: Current Every Day Smoker -- 1.0 packs/day for 47 years    Types: Cigarettes  . Smokeless tobacco: Never Used     Comment: encouraged to quit today  . Alcohol Use: Yes     Comment: formerly very heavy ETOH, quit 08/23/11  . Drug Use: Yes     Comment: marajuana occasionally  . Sexually Active: Not Currently   Other Topics Concern  . 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 albuterol (PROVENTIL HFA;VENTOLIN HFA) 108 (90 BASE) MCG/ACT inhaler Inhale 2 puffs into the lungs every 6 (six) hours as needed. For shortness of breath      . carvedilol (COREG) 6.25  MG tablet Take 2 tablets (12.5 mg total) by mouth 2 (two) times daily with a meal.  120 tablet  3  . citalopram (CELEXA) 20 MG tablet Take 20 mg by mouth every evening.      . furosemide (LASIX) 20 MG tablet Take 20 mg by mouth daily.       Marland Kitchen lisinopril (PRINIVIL,ZESTRIL) 5 MG tablet Take 5 mg by mouth daily.      . metFORMIN (GLUCOPHAGE) 500 MG tablet Take 500 mg by mouth 3 (three) times daily.       Last reviewed on 02/08/2012  2:51 PM by Lazarus Salines, RN       Objective:   Physical Exam Filed Vitals:   02/08/12 1451  BP: 140/88  Pulse: 80  Temp: 98.1 F (36.7 C)   Gen: Pleasant, well-nourished, in no distress,  normal affect  ENT: No lesions,   mouth clear,  oropharynx clear, no postnasal drip  Neck: No JVD, no TMG, no carotid bruits  Lungs: No use of accessory muscles, no dullness to percussion, clear without rales or rhonchi  Cardiovascular: RRR, heart sounds normal, no murmur or gallops, no peripheral edema  Musculoskeletal: No deformities, no cyanosis or clubbing  Neuro: alert, non focal  Skin: Warm, no lesions or rashes      Assessment & Plan:

## 2012-02-08 NOTE — Patient Instructions (Addendum)
Please continue your DuoNebs four time a day Use albuterol as needed We will perform blood work today We will perform full pulmonary function testing on the same day as your follow up visit Follow with Dr Delton Coombes next available appointment

## 2012-02-14 LAB — ALPHA-1 ANTITRYPSIN PHENOTYPE: A-1 Antitrypsin: 159 mg/dL (ref 83–199)

## 2012-03-04 ENCOUNTER — Encounter: Payer: Self-pay | Admitting: Emergency Medicine

## 2012-03-04 ENCOUNTER — Ambulatory Visit (INDEPENDENT_AMBULATORY_CARE_PROVIDER_SITE_OTHER): Payer: Medicare Other | Admitting: Emergency Medicine

## 2012-03-04 VITALS — BP 140/70 | HR 81 | Temp 98.1°F | Ht 73.0 in | Wt 265.0 lb

## 2012-03-04 DIAGNOSIS — J449 Chronic obstructive pulmonary disease, unspecified: Secondary | ICD-10-CM | POA: Diagnosis not present

## 2012-03-04 DIAGNOSIS — J4489 Other specified chronic obstructive pulmonary disease: Secondary | ICD-10-CM

## 2012-03-04 LAB — PULMONARY FUNCTION TEST

## 2012-03-04 NOTE — Patient Instructions (Addendum)
Please continue your DuoNebs four times a day Use albuterol as needed We will check for financial assistance for the longer-acting inhaled medications.  Follow with Dr Delton Coombes in 3 months or sooner if you have any problems.

## 2012-03-04 NOTE — Progress Notes (Signed)
  Subjective:    Patient ID: Chase Herrera, male    DOB: Jun 28, 1950, 62 y.o.   MRN: [id number]  HPI 62 yo man, hx tobacco (~ 90-100 pk-yrs), HTN, DM, A flutter s/p ablation and improved cardiomyopathy post-ablation. Presents today for eval of his dyspnea. Was dx with COPD about 3 yrs ago.  He has had flared recently, was hospitalized at [facility]. He is on Symbicort bid about 2-3 yrs but couldn;t afford. He is on DuoNebs qid. He was on spiriva for a few years also - used unreliably due to cost.   He describes some exertional SOB, worst in the morning. Cough that produces white/clear, worst in the am. Using SABA prn, a few times a week.   ROV 03/04/12 -- for COPD and tobacco. PFT today >> very severe AFL with positive BD response, normal volumes, decreased diffusion that corrects. Returns for follow up. a1-AT is normal MM. He feels better now that albuterol changed to duonebs. He still coughs in the am.   PULMONARY FUNCTON TEST 03/04/2012  FVC 3.02  FEV1 1.43  FEV1/FVC 47.4  FVC  % Predicted 59  FEV % Predicted 40  FeF 25-75 .41  FeF 25-75 % Predicted 3.29    Review of Systems  Constitutional: Positive for activity change. Negative for fever and unexpected weight change.  HENT: Positive for congestion and sneezing. Negative for ear pain, nosebleeds, sore throat, rhinorrhea, trouble swallowing, dental problem, postnasal drip and sinus pressure.   Eyes: Negative for redness and itching.  Respiratory: Positive for cough and shortness of breath. Negative for chest tightness and wheezing.   Cardiovascular: Negative for palpitations and leg swelling.  Gastrointestinal: Negative for nausea and vomiting.  Genitourinary: Negative for dysuria.  Musculoskeletal: Negative for joint swelling.  Skin: Negative for rash.  Neurological: Negative for headaches.  Hematological: Does not bruise/bleed easily.  Psychiatric/Behavioral: Negative for dysphoric mood. The patient is nervous/anxious.         Objective:   Physical Exam Filed Vitals:   03/04/12 1603  BP: 140/70  Pulse: 81  Temp: 98.1 F (36.7 C)   Gen: Pleasant, overwt, in no distress,  normal affect  ENT: No lesions,  mouth clear,  oropharynx clear, no postnasal drip  Neck: No JVD, no TMG, no carotid bruits  Lungs: No use of accessory muscles, no dullness to percussion, clear without rales or rhonchi  Cardiovascular: RRR, heart sounds normal, no murmur or gallops, no peripheral edema  Musculoskeletal: No deformities, no cyanosis or clubbing  Neuro: alert, non focal  Skin: Warm, no lesions or rashes      Assessment & Plan:  COPD (chronic obstructive pulmonary disease) Severe AFL on spiro, significant sx. Somewhat better since changing albuterol to duonebs.  - continue the duonebs - albuterol prn - will work on getting financial assist for long-acting meds

## 2012-03-04 NOTE — Assessment & Plan Note (Signed)
Severe AFL on spiro, significant sx. Somewhat better since changing albuterol to duonebs.  - continue the duonebs - albuterol prn - will work on getting financial assist for long-acting meds

## 2012-03-04 NOTE — Progress Notes (Signed)
PFT done today. 

## 2012-04-01 ENCOUNTER — Ambulatory Visit: Payer: Medicare Other

## 2012-04-17 ENCOUNTER — Encounter: Payer: Medicare Other | Admitting: Cardiology

## 2012-04-17 ENCOUNTER — Encounter: Payer: Self-pay | Admitting: Cardiology

## 2012-04-17 NOTE — Progress Notes (Signed)
No show  This encounter was created in error - please disregard.

## 2012-05-28 DIAGNOSIS — M79609 Pain in unspecified limb: Secondary | ICD-10-CM | POA: Diagnosis not present

## 2012-05-29 DIAGNOSIS — IMO0002 Reserved for concepts with insufficient information to code with codable children: Secondary | ICD-10-CM | POA: Diagnosis not present

## 2012-05-29 DIAGNOSIS — M2340 Loose body in knee, unspecified knee: Secondary | ICD-10-CM | POA: Diagnosis not present

## 2012-06-02 ENCOUNTER — Other Ambulatory Visit: Payer: Self-pay | Admitting: *Deleted

## 2012-06-02 MED ORDER — CARVEDILOL 6.25 MG PO TABS: 12.5000 mg | ORAL_TABLET | Freq: Two times a day (BID) | ORAL | Status: DC

## 2012-07-31 ENCOUNTER — Other Ambulatory Visit: Payer: Self-pay | Admitting: Cardiology

## 2012-07-31 MED ORDER — CARVEDILOL 12.5 MG PO TABS: 12.5000 mg | ORAL_TABLET | Freq: Two times a day (BID) | ORAL | Status: DC

## 2012-08-05 DIAGNOSIS — R0602 Shortness of breath: Secondary | ICD-10-CM | POA: Diagnosis not present

## 2012-08-06 DIAGNOSIS — I10 Essential (primary) hypertension: Secondary | ICD-10-CM | POA: Diagnosis not present

## 2012-08-06 DIAGNOSIS — J4489 Other specified chronic obstructive pulmonary disease: Secondary | ICD-10-CM | POA: Diagnosis not present

## 2012-08-06 DIAGNOSIS — E119 Type 2 diabetes mellitus without complications: Secondary | ICD-10-CM | POA: Diagnosis not present

## 2012-08-06 DIAGNOSIS — I4891 Unspecified atrial fibrillation: Secondary | ICD-10-CM | POA: Diagnosis not present

## 2012-08-06 DIAGNOSIS — J449 Chronic obstructive pulmonary disease, unspecified: Secondary | ICD-10-CM | POA: Diagnosis not present

## 2012-08-06 DIAGNOSIS — I509 Heart failure, unspecified: Secondary | ICD-10-CM | POA: Diagnosis not present

## 2012-08-06 DIAGNOSIS — R0602 Shortness of breath: Secondary | ICD-10-CM | POA: Diagnosis not present

## 2012-08-06 DIAGNOSIS — Z79899 Other long term (current) drug therapy: Secondary | ICD-10-CM | POA: Diagnosis not present

## 2012-08-06 DIAGNOSIS — F17200 Nicotine dependence, unspecified, uncomplicated: Secondary | ICD-10-CM | POA: Diagnosis not present

## 2012-08-06 DIAGNOSIS — Z87442 Personal history of urinary calculi: Secondary | ICD-10-CM | POA: Diagnosis not present

## 2012-09-21 DIAGNOSIS — E119 Type 2 diabetes mellitus without complications: Secondary | ICD-10-CM | POA: Diagnosis not present

## 2012-09-21 DIAGNOSIS — R0602 Shortness of breath: Secondary | ICD-10-CM

## 2012-09-21 DIAGNOSIS — J441 Chronic obstructive pulmonary disease with (acute) exacerbation: Secondary | ICD-10-CM | POA: Diagnosis not present

## 2012-09-21 DIAGNOSIS — Z79899 Other long term (current) drug therapy: Secondary | ICD-10-CM | POA: Diagnosis not present

## 2012-09-21 DIAGNOSIS — I10 Essential (primary) hypertension: Secondary | ICD-10-CM | POA: Diagnosis not present

## 2012-09-21 DIAGNOSIS — R0902 Hypoxemia: Secondary | ICD-10-CM | POA: Diagnosis not present

## 2012-09-21 DIAGNOSIS — I509 Heart failure, unspecified: Secondary | ICD-10-CM | POA: Diagnosis not present

## 2012-09-21 DIAGNOSIS — J449 Chronic obstructive pulmonary disease, unspecified: Secondary | ICD-10-CM | POA: Diagnosis not present

## 2012-09-21 DIAGNOSIS — J9600 Acute respiratory failure, unspecified whether with hypoxia or hypercapnia: Secondary | ICD-10-CM | POA: Diagnosis not present

## 2012-09-21 DIAGNOSIS — F17200 Nicotine dependence, unspecified, uncomplicated: Secondary | ICD-10-CM | POA: Diagnosis not present

## 2012-09-22 DIAGNOSIS — J449 Chronic obstructive pulmonary disease, unspecified: Secondary | ICD-10-CM | POA: Diagnosis not present

## 2012-09-26 DIAGNOSIS — Z23 Encounter for immunization: Secondary | ICD-10-CM | POA: Diagnosis not present

## 2012-10-03 DIAGNOSIS — E119 Type 2 diabetes mellitus without complications: Secondary | ICD-10-CM | POA: Diagnosis not present

## 2012-10-03 DIAGNOSIS — I10 Essential (primary) hypertension: Secondary | ICD-10-CM | POA: Diagnosis not present

## 2012-10-03 DIAGNOSIS — J438 Other emphysema: Secondary | ICD-10-CM | POA: Diagnosis not present

## 2012-10-03 DIAGNOSIS — R0602 Shortness of breath: Secondary | ICD-10-CM | POA: Diagnosis not present

## 2012-10-03 DIAGNOSIS — Z79899 Other long term (current) drug therapy: Secondary | ICD-10-CM | POA: Diagnosis not present

## 2012-10-03 DIAGNOSIS — J441 Chronic obstructive pulmonary disease with (acute) exacerbation: Secondary | ICD-10-CM | POA: Diagnosis not present

## 2012-10-15 DIAGNOSIS — R29898 Other symptoms and signs involving the musculoskeletal system: Secondary | ICD-10-CM | POA: Diagnosis not present

## 2012-10-15 DIAGNOSIS — I509 Heart failure, unspecified: Secondary | ICD-10-CM | POA: Diagnosis not present

## 2012-10-15 DIAGNOSIS — I10 Essential (primary) hypertension: Secondary | ICD-10-CM | POA: Diagnosis not present

## 2012-10-15 DIAGNOSIS — F17200 Nicotine dependence, unspecified, uncomplicated: Secondary | ICD-10-CM | POA: Diagnosis not present

## 2012-10-15 DIAGNOSIS — M25519 Pain in unspecified shoulder: Secondary | ICD-10-CM | POA: Diagnosis not present

## 2012-10-15 DIAGNOSIS — E119 Type 2 diabetes mellitus without complications: Secondary | ICD-10-CM | POA: Diagnosis not present

## 2012-10-15 DIAGNOSIS — R29818 Other symptoms and signs involving the nervous system: Secondary | ICD-10-CM | POA: Diagnosis not present

## 2012-10-15 DIAGNOSIS — Z79899 Other long term (current) drug therapy: Secondary | ICD-10-CM | POA: Diagnosis not present

## 2012-10-15 DIAGNOSIS — R5381 Other malaise: Secondary | ICD-10-CM | POA: Diagnosis not present

## 2012-10-15 DIAGNOSIS — J449 Chronic obstructive pulmonary disease, unspecified: Secondary | ICD-10-CM | POA: Diagnosis not present

## 2012-10-15 DIAGNOSIS — J984 Other disorders of lung: Secondary | ICD-10-CM | POA: Diagnosis not present

## 2012-10-15 DIAGNOSIS — I4891 Unspecified atrial fibrillation: Secondary | ICD-10-CM | POA: Diagnosis not present

## 2012-10-15 DIAGNOSIS — M79609 Pain in unspecified limb: Secondary | ICD-10-CM | POA: Diagnosis not present

## 2012-10-15 DIAGNOSIS — J40 Bronchitis, not specified as acute or chronic: Secondary | ICD-10-CM | POA: Diagnosis not present

## 2012-10-15 DIAGNOSIS — M19019 Primary osteoarthritis, unspecified shoulder: Secondary | ICD-10-CM | POA: Diagnosis not present

## 2012-10-16 DIAGNOSIS — M79609 Pain in unspecified limb: Secondary | ICD-10-CM | POA: Diagnosis not present

## 2012-10-16 DIAGNOSIS — R5381 Other malaise: Secondary | ICD-10-CM | POA: Diagnosis not present

## 2012-10-22 ENCOUNTER — Encounter: Payer: Self-pay | Admitting: Cardiology

## 2012-10-22 ENCOUNTER — Ambulatory Visit: Payer: Medicare Other | Admitting: Cardiology

## 2012-10-22 ENCOUNTER — Encounter: Payer: Medicare Other | Admitting: Cardiology

## 2012-10-22 NOTE — Progress Notes (Signed)
No show  This encounter was created in error - please disregard.

## 2012-11-04 DIAGNOSIS — Z79899 Other long term (current) drug therapy: Secondary | ICD-10-CM | POA: Diagnosis not present

## 2012-11-04 DIAGNOSIS — E119 Type 2 diabetes mellitus without complications: Secondary | ICD-10-CM | POA: Diagnosis not present

## 2012-11-04 DIAGNOSIS — I10 Essential (primary) hypertension: Secondary | ICD-10-CM | POA: Diagnosis not present

## 2012-11-04 DIAGNOSIS — I509 Heart failure, unspecified: Secondary | ICD-10-CM | POA: Diagnosis not present

## 2012-11-04 DIAGNOSIS — R0602 Shortness of breath: Secondary | ICD-10-CM | POA: Diagnosis not present

## 2012-11-04 DIAGNOSIS — J449 Chronic obstructive pulmonary disease, unspecified: Secondary | ICD-10-CM | POA: Diagnosis not present

## 2012-11-04 DIAGNOSIS — F17200 Nicotine dependence, unspecified, uncomplicated: Secondary | ICD-10-CM | POA: Diagnosis not present

## 2012-11-04 DIAGNOSIS — J441 Chronic obstructive pulmonary disease with (acute) exacerbation: Secondary | ICD-10-CM | POA: Diagnosis not present

## 2012-11-05 DIAGNOSIS — J449 Chronic obstructive pulmonary disease, unspecified: Secondary | ICD-10-CM | POA: Diagnosis not present

## 2012-11-24 DIAGNOSIS — R0602 Shortness of breath: Secondary | ICD-10-CM | POA: Diagnosis not present

## 2012-11-25 DIAGNOSIS — E119 Type 2 diabetes mellitus without complications: Secondary | ICD-10-CM | POA: Diagnosis not present

## 2012-11-25 DIAGNOSIS — I4891 Unspecified atrial fibrillation: Secondary | ICD-10-CM | POA: Diagnosis not present

## 2012-11-25 DIAGNOSIS — I10 Essential (primary) hypertension: Secondary | ICD-10-CM | POA: Diagnosis not present

## 2012-11-25 DIAGNOSIS — R0602 Shortness of breath: Secondary | ICD-10-CM | POA: Diagnosis not present

## 2012-11-25 DIAGNOSIS — I509 Heart failure, unspecified: Secondary | ICD-10-CM | POA: Diagnosis not present

## 2012-11-25 DIAGNOSIS — F17200 Nicotine dependence, unspecified, uncomplicated: Secondary | ICD-10-CM | POA: Diagnosis not present

## 2012-11-25 DIAGNOSIS — Z79899 Other long term (current) drug therapy: Secondary | ICD-10-CM | POA: Diagnosis not present

## 2012-11-25 DIAGNOSIS — J441 Chronic obstructive pulmonary disease with (acute) exacerbation: Secondary | ICD-10-CM | POA: Diagnosis not present

## 2012-11-26 DIAGNOSIS — R0602 Shortness of breath: Secondary | ICD-10-CM | POA: Diagnosis not present

## 2012-11-27 ENCOUNTER — Other Ambulatory Visit: Payer: Self-pay

## 2012-12-17 DIAGNOSIS — R0602 Shortness of breath: Secondary | ICD-10-CM | POA: Diagnosis not present

## 2012-12-24 DIAGNOSIS — Z79899 Other long term (current) drug therapy: Secondary | ICD-10-CM | POA: Diagnosis not present

## 2012-12-24 DIAGNOSIS — I5022 Chronic systolic (congestive) heart failure: Secondary | ICD-10-CM | POA: Diagnosis not present

## 2012-12-24 DIAGNOSIS — Z8249 Family history of ischemic heart disease and other diseases of the circulatory system: Secondary | ICD-10-CM | POA: Diagnosis not present

## 2012-12-24 DIAGNOSIS — R339 Retention of urine, unspecified: Secondary | ICD-10-CM | POA: Diagnosis not present

## 2012-12-24 DIAGNOSIS — E860 Dehydration: Secondary | ICD-10-CM | POA: Diagnosis not present

## 2012-12-24 DIAGNOSIS — F17200 Nicotine dependence, unspecified, uncomplicated: Secondary | ICD-10-CM | POA: Diagnosis not present

## 2012-12-24 DIAGNOSIS — I10 Essential (primary) hypertension: Secondary | ICD-10-CM | POA: Diagnosis not present

## 2012-12-24 DIAGNOSIS — E119 Type 2 diabetes mellitus without complications: Secondary | ICD-10-CM | POA: Diagnosis not present

## 2012-12-24 DIAGNOSIS — Z87442 Personal history of urinary calculi: Secondary | ICD-10-CM | POA: Diagnosis not present

## 2012-12-24 DIAGNOSIS — J449 Chronic obstructive pulmonary disease, unspecified: Secondary | ICD-10-CM | POA: Diagnosis not present

## 2012-12-24 DIAGNOSIS — N179 Acute kidney failure, unspecified: Secondary | ICD-10-CM | POA: Diagnosis not present

## 2012-12-24 DIAGNOSIS — I509 Heart failure, unspecified: Secondary | ICD-10-CM | POA: Diagnosis not present

## 2012-12-25 DIAGNOSIS — R339 Retention of urine, unspecified: Secondary | ICD-10-CM | POA: Diagnosis not present

## 2012-12-25 DIAGNOSIS — N179 Acute kidney failure, unspecified: Secondary | ICD-10-CM | POA: Diagnosis not present

## 2012-12-31 DIAGNOSIS — J449 Chronic obstructive pulmonary disease, unspecified: Secondary | ICD-10-CM | POA: Diagnosis not present

## 2012-12-31 DIAGNOSIS — Z8249 Family history of ischemic heart disease and other diseases of the circulatory system: Secondary | ICD-10-CM | POA: Diagnosis not present

## 2012-12-31 DIAGNOSIS — I4891 Unspecified atrial fibrillation: Secondary | ICD-10-CM | POA: Diagnosis not present

## 2012-12-31 DIAGNOSIS — I509 Heart failure, unspecified: Secondary | ICD-10-CM | POA: Diagnosis not present

## 2012-12-31 DIAGNOSIS — F17200 Nicotine dependence, unspecified, uncomplicated: Secondary | ICD-10-CM | POA: Diagnosis not present

## 2012-12-31 DIAGNOSIS — E119 Type 2 diabetes mellitus without complications: Secondary | ICD-10-CM | POA: Diagnosis not present

## 2012-12-31 DIAGNOSIS — I10 Essential (primary) hypertension: Secondary | ICD-10-CM | POA: Diagnosis not present

## 2012-12-31 DIAGNOSIS — Z79899 Other long term (current) drug therapy: Secondary | ICD-10-CM | POA: Diagnosis not present

## 2012-12-31 DIAGNOSIS — N390 Urinary tract infection, site not specified: Secondary | ICD-10-CM | POA: Diagnosis not present

## 2012-12-31 DIAGNOSIS — N489 Disorder of penis, unspecified: Secondary | ICD-10-CM | POA: Diagnosis not present

## 2013-01-02 DIAGNOSIS — R339 Retention of urine, unspecified: Secondary | ICD-10-CM | POA: Diagnosis not present

## 2013-01-02 DIAGNOSIS — R39198 Other difficulties with micturition: Secondary | ICD-10-CM | POA: Diagnosis not present

## 2013-01-02 DIAGNOSIS — F17200 Nicotine dependence, unspecified, uncomplicated: Secondary | ICD-10-CM | POA: Diagnosis not present

## 2013-01-23 DIAGNOSIS — F17200 Nicotine dependence, unspecified, uncomplicated: Secondary | ICD-10-CM | POA: Diagnosis not present

## 2013-01-23 DIAGNOSIS — R0602 Shortness of breath: Secondary | ICD-10-CM | POA: Diagnosis not present

## 2013-01-23 DIAGNOSIS — I509 Heart failure, unspecified: Secondary | ICD-10-CM | POA: Diagnosis not present

## 2013-01-23 DIAGNOSIS — Z79899 Other long term (current) drug therapy: Secondary | ICD-10-CM | POA: Diagnosis not present

## 2013-01-23 DIAGNOSIS — J441 Chronic obstructive pulmonary disease with (acute) exacerbation: Secondary | ICD-10-CM | POA: Diagnosis not present

## 2013-01-23 DIAGNOSIS — E119 Type 2 diabetes mellitus without complications: Secondary | ICD-10-CM | POA: Diagnosis not present

## 2013-01-23 DIAGNOSIS — R069 Unspecified abnormalities of breathing: Secondary | ICD-10-CM | POA: Diagnosis not present

## 2013-01-23 DIAGNOSIS — Z8249 Family history of ischemic heart disease and other diseases of the circulatory system: Secondary | ICD-10-CM | POA: Diagnosis not present

## 2013-01-23 DIAGNOSIS — I10 Essential (primary) hypertension: Secondary | ICD-10-CM | POA: Diagnosis not present

## 2013-01-24 DIAGNOSIS — F17200 Nicotine dependence, unspecified, uncomplicated: Secondary | ICD-10-CM | POA: Diagnosis not present

## 2013-01-24 DIAGNOSIS — R339 Retention of urine, unspecified: Secondary | ICD-10-CM | POA: Diagnosis not present

## 2013-01-24 DIAGNOSIS — R059 Cough, unspecified: Secondary | ICD-10-CM | POA: Diagnosis not present

## 2013-01-24 DIAGNOSIS — R05 Cough: Secondary | ICD-10-CM | POA: Diagnosis not present

## 2013-01-24 DIAGNOSIS — J438 Other emphysema: Secondary | ICD-10-CM | POA: Diagnosis not present

## 2013-01-24 DIAGNOSIS — E119 Type 2 diabetes mellitus without complications: Secondary | ICD-10-CM | POA: Diagnosis not present

## 2013-01-28 DIAGNOSIS — R0602 Shortness of breath: Secondary | ICD-10-CM | POA: Diagnosis not present

## 2013-02-02 DIAGNOSIS — R39198 Other difficulties with micturition: Secondary | ICD-10-CM | POA: Diagnosis not present

## 2013-02-02 DIAGNOSIS — N4340 Spermatocele of epididymis, unspecified: Secondary | ICD-10-CM | POA: Diagnosis not present

## 2013-02-02 DIAGNOSIS — R339 Retention of urine, unspecified: Secondary | ICD-10-CM | POA: Diagnosis not present

## 2013-02-02 DIAGNOSIS — F17200 Nicotine dependence, unspecified, uncomplicated: Secondary | ICD-10-CM | POA: Diagnosis not present

## 2013-02-03 DIAGNOSIS — R0602 Shortness of breath: Secondary | ICD-10-CM | POA: Diagnosis not present

## 2013-02-15 DIAGNOSIS — R079 Chest pain, unspecified: Secondary | ICD-10-CM | POA: Diagnosis not present

## 2013-03-26 ENCOUNTER — Encounter: Payer: Self-pay | Admitting: Cardiology

## 2013-03-26 DIAGNOSIS — I509 Heart failure, unspecified: Secondary | ICD-10-CM | POA: Diagnosis present

## 2013-03-26 DIAGNOSIS — F17200 Nicotine dependence, unspecified, uncomplicated: Secondary | ICD-10-CM | POA: Diagnosis not present

## 2013-03-26 DIAGNOSIS — E119 Type 2 diabetes mellitus without complications: Secondary | ICD-10-CM | POA: Diagnosis present

## 2013-03-26 DIAGNOSIS — R0602 Shortness of breath: Secondary | ICD-10-CM | POA: Diagnosis not present

## 2013-03-26 DIAGNOSIS — Z6833 Body mass index (BMI) 33.0-33.9, adult: Secondary | ICD-10-CM | POA: Diagnosis not present

## 2013-03-26 DIAGNOSIS — Z79899 Other long term (current) drug therapy: Secondary | ICD-10-CM | POA: Diagnosis not present

## 2013-03-26 DIAGNOSIS — J9600 Acute respiratory failure, unspecified whether with hypoxia or hypercapnia: Secondary | ICD-10-CM | POA: Diagnosis not present

## 2013-03-26 DIAGNOSIS — I5022 Chronic systolic (congestive) heart failure: Secondary | ICD-10-CM | POA: Diagnosis present

## 2013-03-26 DIAGNOSIS — R0989 Other specified symptoms and signs involving the circulatory and respiratory systems: Secondary | ICD-10-CM | POA: Diagnosis not present

## 2013-03-26 DIAGNOSIS — I10 Essential (primary) hypertension: Secondary | ICD-10-CM | POA: Diagnosis present

## 2013-03-26 DIAGNOSIS — J441 Chronic obstructive pulmonary disease with (acute) exacerbation: Secondary | ICD-10-CM | POA: Diagnosis not present

## 2013-03-26 DIAGNOSIS — R0609 Other forms of dyspnea: Secondary | ICD-10-CM | POA: Diagnosis not present

## 2013-03-26 DIAGNOSIS — I4891 Unspecified atrial fibrillation: Secondary | ICD-10-CM | POA: Diagnosis present

## 2013-03-27 ENCOUNTER — Encounter: Payer: Self-pay | Admitting: Cardiology

## 2013-03-27 DIAGNOSIS — J9600 Acute respiratory failure, unspecified whether with hypoxia or hypercapnia: Secondary | ICD-10-CM | POA: Diagnosis not present

## 2013-03-31 ENCOUNTER — Other Ambulatory Visit: Payer: Self-pay | Admitting: Cardiology

## 2013-04-27 DIAGNOSIS — J438 Other emphysema: Secondary | ICD-10-CM | POA: Diagnosis not present

## 2013-04-27 DIAGNOSIS — E119 Type 2 diabetes mellitus without complications: Secondary | ICD-10-CM | POA: Diagnosis not present

## 2013-04-27 DIAGNOSIS — J9600 Acute respiratory failure, unspecified whether with hypoxia or hypercapnia: Secondary | ICD-10-CM | POA: Diagnosis not present

## 2013-04-27 DIAGNOSIS — F17200 Nicotine dependence, unspecified, uncomplicated: Secondary | ICD-10-CM | POA: Diagnosis not present

## 2013-04-30 DIAGNOSIS — R0609 Other forms of dyspnea: Secondary | ICD-10-CM | POA: Diagnosis not present

## 2013-04-30 DIAGNOSIS — R0989 Other specified symptoms and signs involving the circulatory and respiratory systems: Secondary | ICD-10-CM | POA: Diagnosis not present

## 2013-04-30 DIAGNOSIS — E669 Obesity, unspecified: Secondary | ICD-10-CM | POA: Diagnosis not present

## 2013-04-30 DIAGNOSIS — R0602 Shortness of breath: Secondary | ICD-10-CM | POA: Diagnosis not present

## 2013-04-30 DIAGNOSIS — I5020 Unspecified systolic (congestive) heart failure: Secondary | ICD-10-CM | POA: Diagnosis not present

## 2013-04-30 DIAGNOSIS — J441 Chronic obstructive pulmonary disease with (acute) exacerbation: Secondary | ICD-10-CM | POA: Diagnosis not present

## 2013-04-30 DIAGNOSIS — I10 Essential (primary) hypertension: Secondary | ICD-10-CM | POA: Diagnosis not present

## 2013-04-30 DIAGNOSIS — F17200 Nicotine dependence, unspecified, uncomplicated: Secondary | ICD-10-CM | POA: Diagnosis not present

## 2013-04-30 DIAGNOSIS — Z79899 Other long term (current) drug therapy: Secondary | ICD-10-CM | POA: Diagnosis not present

## 2013-04-30 DIAGNOSIS — I4891 Unspecified atrial fibrillation: Secondary | ICD-10-CM | POA: Diagnosis not present

## 2013-04-30 DIAGNOSIS — E119 Type 2 diabetes mellitus without complications: Secondary | ICD-10-CM | POA: Diagnosis not present

## 2013-04-30 DIAGNOSIS — Z6832 Body mass index (BMI) 32.0-32.9, adult: Secondary | ICD-10-CM | POA: Diagnosis not present

## 2013-04-30 DIAGNOSIS — Z8249 Family history of ischemic heart disease and other diseases of the circulatory system: Secondary | ICD-10-CM | POA: Diagnosis not present

## 2013-05-03 ENCOUNTER — Other Ambulatory Visit: Payer: Self-pay | Admitting: Cardiology

## 2013-05-14 DIAGNOSIS — J9383 Other pneumothorax: Secondary | ICD-10-CM | POA: Diagnosis not present

## 2013-05-14 DIAGNOSIS — J441 Chronic obstructive pulmonary disease with (acute) exacerbation: Secondary | ICD-10-CM | POA: Diagnosis not present

## 2013-05-14 DIAGNOSIS — J9819 Other pulmonary collapse: Secondary | ICD-10-CM | POA: Diagnosis not present

## 2013-05-14 DIAGNOSIS — R079 Chest pain, unspecified: Secondary | ICD-10-CM | POA: Diagnosis not present

## 2013-05-14 DIAGNOSIS — X58XXXA Exposure to other specified factors, initial encounter: Secondary | ICD-10-CM | POA: Diagnosis not present

## 2013-05-14 DIAGNOSIS — R0602 Shortness of breath: Secondary | ICD-10-CM | POA: Diagnosis not present

## 2013-05-14 DIAGNOSIS — S2239XA Fracture of one rib, unspecified side, initial encounter for closed fracture: Secondary | ICD-10-CM | POA: Diagnosis not present

## 2013-05-15 DIAGNOSIS — J441 Chronic obstructive pulmonary disease with (acute) exacerbation: Secondary | ICD-10-CM | POA: Diagnosis not present

## 2013-05-15 DIAGNOSIS — E119 Type 2 diabetes mellitus without complications: Secondary | ICD-10-CM | POA: Diagnosis not present

## 2013-05-15 DIAGNOSIS — R0609 Other forms of dyspnea: Secondary | ICD-10-CM | POA: Diagnosis not present

## 2013-05-15 DIAGNOSIS — X58XXXA Exposure to other specified factors, initial encounter: Secondary | ICD-10-CM | POA: Diagnosis not present

## 2013-05-15 DIAGNOSIS — J9383 Other pneumothorax: Secondary | ICD-10-CM | POA: Diagnosis not present

## 2013-05-15 DIAGNOSIS — S2239XA Fracture of one rib, unspecified side, initial encounter for closed fracture: Secondary | ICD-10-CM | POA: Diagnosis present

## 2013-05-15 DIAGNOSIS — J440 Chronic obstructive pulmonary disease with acute lower respiratory infection: Secondary | ICD-10-CM | POA: Diagnosis not present

## 2013-05-15 DIAGNOSIS — F17200 Nicotine dependence, unspecified, uncomplicated: Secondary | ICD-10-CM | POA: Diagnosis not present

## 2013-05-15 DIAGNOSIS — J9819 Other pulmonary collapse: Secondary | ICD-10-CM | POA: Diagnosis not present

## 2013-05-15 DIAGNOSIS — Z79899 Other long term (current) drug therapy: Secondary | ICD-10-CM | POA: Diagnosis not present

## 2013-05-15 DIAGNOSIS — I509 Heart failure, unspecified: Secondary | ICD-10-CM | POA: Diagnosis present

## 2013-05-15 DIAGNOSIS — R0602 Shortness of breath: Secondary | ICD-10-CM | POA: Diagnosis not present

## 2013-05-15 DIAGNOSIS — S270XXA Traumatic pneumothorax, initial encounter: Secondary | ICD-10-CM | POA: Diagnosis not present

## 2013-05-15 DIAGNOSIS — I10 Essential (primary) hypertension: Secondary | ICD-10-CM | POA: Diagnosis not present

## 2013-05-15 DIAGNOSIS — R079 Chest pain, unspecified: Secondary | ICD-10-CM | POA: Diagnosis not present

## 2013-07-03 DIAGNOSIS — Z79899 Other long term (current) drug therapy: Secondary | ICD-10-CM | POA: Diagnosis not present

## 2013-07-03 DIAGNOSIS — J441 Chronic obstructive pulmonary disease with (acute) exacerbation: Secondary | ICD-10-CM | POA: Diagnosis not present

## 2013-07-03 DIAGNOSIS — F17200 Nicotine dependence, unspecified, uncomplicated: Secondary | ICD-10-CM | POA: Diagnosis not present

## 2013-07-03 DIAGNOSIS — J449 Chronic obstructive pulmonary disease, unspecified: Secondary | ICD-10-CM | POA: Diagnosis not present

## 2013-07-03 DIAGNOSIS — R0602 Shortness of breath: Secondary | ICD-10-CM | POA: Diagnosis not present

## 2013-07-03 DIAGNOSIS — Z87442 Personal history of urinary calculi: Secondary | ICD-10-CM | POA: Diagnosis not present

## 2013-07-03 DIAGNOSIS — J9600 Acute respiratory failure, unspecified whether with hypoxia or hypercapnia: Secondary | ICD-10-CM | POA: Diagnosis not present

## 2013-07-03 DIAGNOSIS — N401 Enlarged prostate with lower urinary tract symptoms: Secondary | ICD-10-CM | POA: Diagnosis not present

## 2013-07-03 DIAGNOSIS — I10 Essential (primary) hypertension: Secondary | ICD-10-CM | POA: Diagnosis not present

## 2013-07-03 DIAGNOSIS — E119 Type 2 diabetes mellitus without complications: Secondary | ICD-10-CM | POA: Diagnosis not present

## 2013-07-04 DIAGNOSIS — J441 Chronic obstructive pulmonary disease with (acute) exacerbation: Secondary | ICD-10-CM | POA: Diagnosis not present

## 2013-07-05 DIAGNOSIS — J441 Chronic obstructive pulmonary disease with (acute) exacerbation: Secondary | ICD-10-CM | POA: Diagnosis not present

## 2013-07-06 ENCOUNTER — Other Ambulatory Visit: Payer: Self-pay | Admitting: Cardiology

## 2013-07-10 DIAGNOSIS — Z136 Encounter for screening for cardiovascular disorders: Secondary | ICD-10-CM | POA: Diagnosis not present

## 2013-07-10 DIAGNOSIS — R0602 Shortness of breath: Secondary | ICD-10-CM | POA: Diagnosis not present

## 2013-07-11 DIAGNOSIS — I509 Heart failure, unspecified: Secondary | ICD-10-CM | POA: Diagnosis not present

## 2013-07-11 DIAGNOSIS — J449 Chronic obstructive pulmonary disease, unspecified: Secondary | ICD-10-CM | POA: Diagnosis not present

## 2013-07-11 DIAGNOSIS — R079 Chest pain, unspecified: Secondary | ICD-10-CM | POA: Diagnosis not present

## 2013-07-11 DIAGNOSIS — E119 Type 2 diabetes mellitus without complications: Secondary | ICD-10-CM | POA: Diagnosis not present

## 2013-07-11 DIAGNOSIS — Z79899 Other long term (current) drug therapy: Secondary | ICD-10-CM | POA: Diagnosis not present

## 2013-07-11 DIAGNOSIS — R296 Repeated falls: Secondary | ICD-10-CM | POA: Diagnosis not present

## 2013-07-11 DIAGNOSIS — F17200 Nicotine dependence, unspecified, uncomplicated: Secondary | ICD-10-CM | POA: Diagnosis not present

## 2013-07-11 DIAGNOSIS — S298XXA Other specified injuries of thorax, initial encounter: Secondary | ICD-10-CM | POA: Diagnosis not present

## 2013-07-11 DIAGNOSIS — I10 Essential (primary) hypertension: Secondary | ICD-10-CM | POA: Diagnosis not present

## 2013-07-11 DIAGNOSIS — S20219A Contusion of unspecified front wall of thorax, initial encounter: Secondary | ICD-10-CM | POA: Diagnosis not present

## 2013-07-11 DIAGNOSIS — Z8249 Family history of ischemic heart disease and other diseases of the circulatory system: Secondary | ICD-10-CM | POA: Diagnosis not present

## 2013-07-19 DIAGNOSIS — S298XXA Other specified injuries of thorax, initial encounter: Secondary | ICD-10-CM | POA: Diagnosis not present

## 2013-07-19 DIAGNOSIS — S0993XA Unspecified injury of face, initial encounter: Secondary | ICD-10-CM | POA: Diagnosis not present

## 2013-07-19 DIAGNOSIS — S0990XA Unspecified injury of head, initial encounter: Secondary | ICD-10-CM | POA: Diagnosis not present

## 2013-07-19 DIAGNOSIS — S199XXA Unspecified injury of neck, initial encounter: Secondary | ICD-10-CM | POA: Diagnosis not present

## 2013-07-22 ENCOUNTER — Other Ambulatory Visit: Payer: Self-pay | Admitting: Cardiology

## 2013-07-22 DIAGNOSIS — R0602 Shortness of breath: Secondary | ICD-10-CM | POA: Diagnosis not present

## 2013-07-24 DIAGNOSIS — R079 Chest pain, unspecified: Secondary | ICD-10-CM | POA: Diagnosis not present

## 2013-07-27 ENCOUNTER — Other Ambulatory Visit: Payer: Self-pay | Admitting: *Deleted

## 2013-07-27 MED ORDER — CARVEDILOL 12.5 MG PO TABS
12.5000 mg | ORAL_TABLET | Freq: Two times a day (BID) | ORAL | Status: DC
Start: 2013-07-27 — End: 2013-09-10

## 2013-07-28 DIAGNOSIS — J449 Chronic obstructive pulmonary disease, unspecified: Secondary | ICD-10-CM | POA: Diagnosis not present

## 2013-07-28 DIAGNOSIS — J441 Chronic obstructive pulmonary disease with (acute) exacerbation: Secondary | ICD-10-CM | POA: Diagnosis not present

## 2013-07-28 DIAGNOSIS — R0602 Shortness of breath: Secondary | ICD-10-CM | POA: Diagnosis not present

## 2013-07-28 DIAGNOSIS — Z4802 Encounter for removal of sutures: Secondary | ICD-10-CM | POA: Diagnosis not present

## 2013-07-31 DIAGNOSIS — R0602 Shortness of breath: Secondary | ICD-10-CM | POA: Diagnosis not present

## 2013-08-01 ENCOUNTER — Encounter: Payer: Self-pay | Admitting: Cardiology

## 2013-08-01 DIAGNOSIS — F17200 Nicotine dependence, unspecified, uncomplicated: Secondary | ICD-10-CM | POA: Diagnosis not present

## 2013-08-01 DIAGNOSIS — E872 Acidosis, unspecified: Secondary | ICD-10-CM | POA: Diagnosis not present

## 2013-08-01 DIAGNOSIS — Z87442 Personal history of urinary calculi: Secondary | ICD-10-CM | POA: Diagnosis not present

## 2013-08-01 DIAGNOSIS — I2510 Atherosclerotic heart disease of native coronary artery without angina pectoris: Secondary | ICD-10-CM | POA: Diagnosis not present

## 2013-08-01 DIAGNOSIS — J441 Chronic obstructive pulmonary disease with (acute) exacerbation: Secondary | ICD-10-CM | POA: Diagnosis not present

## 2013-08-01 DIAGNOSIS — Z836 Family history of other diseases of the respiratory system: Secondary | ICD-10-CM | POA: Diagnosis not present

## 2013-08-01 DIAGNOSIS — N400 Enlarged prostate without lower urinary tract symptoms: Secondary | ICD-10-CM | POA: Diagnosis not present

## 2013-08-01 DIAGNOSIS — J40 Bronchitis, not specified as acute or chronic: Secondary | ICD-10-CM | POA: Diagnosis not present

## 2013-08-01 DIAGNOSIS — R0609 Other forms of dyspnea: Secondary | ICD-10-CM | POA: Diagnosis not present

## 2013-08-01 DIAGNOSIS — I252 Old myocardial infarction: Secondary | ICD-10-CM | POA: Diagnosis not present

## 2013-08-01 DIAGNOSIS — E8809 Other disorders of plasma-protein metabolism, not elsewhere classified: Secondary | ICD-10-CM | POA: Diagnosis not present

## 2013-08-01 DIAGNOSIS — R6889 Other general symptoms and signs: Secondary | ICD-10-CM | POA: Diagnosis not present

## 2013-08-01 DIAGNOSIS — I4891 Unspecified atrial fibrillation: Secondary | ICD-10-CM | POA: Diagnosis not present

## 2013-08-01 DIAGNOSIS — Z79899 Other long term (current) drug therapy: Secondary | ICD-10-CM | POA: Diagnosis not present

## 2013-08-01 DIAGNOSIS — E119 Type 2 diabetes mellitus without complications: Secondary | ICD-10-CM | POA: Diagnosis not present

## 2013-08-01 DIAGNOSIS — E441 Mild protein-calorie malnutrition: Secondary | ICD-10-CM | POA: Diagnosis not present

## 2013-08-01 DIAGNOSIS — I959 Hypotension, unspecified: Secondary | ICD-10-CM | POA: Diagnosis not present

## 2013-08-01 DIAGNOSIS — R0602 Shortness of breath: Secondary | ICD-10-CM | POA: Diagnosis not present

## 2013-08-01 DIAGNOSIS — J438 Other emphysema: Secondary | ICD-10-CM | POA: Diagnosis not present

## 2013-08-01 DIAGNOSIS — Z801 Family history of malignant neoplasm of trachea, bronchus and lung: Secondary | ICD-10-CM | POA: Diagnosis not present

## 2013-08-01 DIAGNOSIS — J189 Pneumonia, unspecified organism: Secondary | ICD-10-CM | POA: Diagnosis not present

## 2013-08-01 DIAGNOSIS — I509 Heart failure, unspecified: Secondary | ICD-10-CM | POA: Diagnosis not present

## 2013-08-01 DIAGNOSIS — E860 Dehydration: Secondary | ICD-10-CM | POA: Diagnosis not present

## 2013-08-01 DIAGNOSIS — R3989 Other symptoms and signs involving the genitourinary system: Secondary | ICD-10-CM | POA: Diagnosis not present

## 2013-08-01 DIAGNOSIS — I10 Essential (primary) hypertension: Secondary | ICD-10-CM | POA: Diagnosis not present

## 2013-08-01 DIAGNOSIS — J440 Chronic obstructive pulmonary disease with acute lower respiratory infection: Secondary | ICD-10-CM | POA: Diagnosis not present

## 2013-08-01 DIAGNOSIS — Z8249 Family history of ischemic heart disease and other diseases of the circulatory system: Secondary | ICD-10-CM | POA: Diagnosis not present

## 2013-08-02 DIAGNOSIS — N19 Unspecified kidney failure: Secondary | ICD-10-CM | POA: Diagnosis not present

## 2013-08-02 DIAGNOSIS — R0609 Other forms of dyspnea: Secondary | ICD-10-CM | POA: Diagnosis not present

## 2013-08-02 DIAGNOSIS — R0989 Other specified symptoms and signs involving the circulatory and respiratory systems: Secondary | ICD-10-CM | POA: Diagnosis not present

## 2013-08-03 DIAGNOSIS — J449 Chronic obstructive pulmonary disease, unspecified: Secondary | ICD-10-CM | POA: Diagnosis not present

## 2013-08-03 DIAGNOSIS — R0602 Shortness of breath: Secondary | ICD-10-CM | POA: Diagnosis not present

## 2013-08-03 DIAGNOSIS — E872 Acidosis, unspecified: Secondary | ICD-10-CM | POA: Diagnosis not present

## 2013-08-03 DIAGNOSIS — I509 Heart failure, unspecified: Secondary | ICD-10-CM | POA: Diagnosis not present

## 2013-08-03 DIAGNOSIS — J40 Bronchitis, not specified as acute or chronic: Secondary | ICD-10-CM | POA: Diagnosis not present

## 2013-08-03 DIAGNOSIS — Z136 Encounter for screening for cardiovascular disorders: Secondary | ICD-10-CM | POA: Diagnosis not present

## 2013-08-04 DIAGNOSIS — R059 Cough, unspecified: Secondary | ICD-10-CM | POA: Diagnosis not present

## 2013-08-04 DIAGNOSIS — R05 Cough: Secondary | ICD-10-CM | POA: Diagnosis not present

## 2013-08-04 DIAGNOSIS — Z79899 Other long term (current) drug therapy: Secondary | ICD-10-CM | POA: Diagnosis not present

## 2013-08-04 DIAGNOSIS — I10 Essential (primary) hypertension: Secondary | ICD-10-CM | POA: Diagnosis not present

## 2013-08-04 DIAGNOSIS — F17200 Nicotine dependence, unspecified, uncomplicated: Secondary | ICD-10-CM | POA: Diagnosis not present

## 2013-08-04 DIAGNOSIS — F411 Generalized anxiety disorder: Secondary | ICD-10-CM | POA: Diagnosis not present

## 2013-08-04 DIAGNOSIS — E119 Type 2 diabetes mellitus without complications: Secondary | ICD-10-CM | POA: Diagnosis not present

## 2013-08-04 DIAGNOSIS — J449 Chronic obstructive pulmonary disease, unspecified: Secondary | ICD-10-CM | POA: Diagnosis not present

## 2013-08-04 DIAGNOSIS — R0602 Shortness of breath: Secondary | ICD-10-CM | POA: Diagnosis not present

## 2013-08-05 DIAGNOSIS — R6889 Other general symptoms and signs: Secondary | ICD-10-CM | POA: Diagnosis not present

## 2013-08-07 DIAGNOSIS — R0609 Other forms of dyspnea: Secondary | ICD-10-CM | POA: Diagnosis not present

## 2013-08-07 DIAGNOSIS — R0989 Other specified symptoms and signs involving the circulatory and respiratory systems: Secondary | ICD-10-CM | POA: Diagnosis not present

## 2013-08-07 DIAGNOSIS — Z008 Encounter for other general examination: Secondary | ICD-10-CM | POA: Diagnosis not present

## 2013-08-07 DIAGNOSIS — J9819 Other pulmonary collapse: Secondary | ICD-10-CM | POA: Diagnosis not present

## 2013-08-12 ENCOUNTER — Encounter: Payer: Self-pay | Admitting: Cardiology

## 2013-08-12 ENCOUNTER — Encounter: Payer: Medicare Other | Admitting: Cardiology

## 2013-08-12 NOTE — Progress Notes (Signed)
Patient canceled.  This encounter was created in error - please disregard. 

## 2013-08-14 DIAGNOSIS — Z136 Encounter for screening for cardiovascular disorders: Secondary | ICD-10-CM | POA: Diagnosis not present

## 2013-08-19 ENCOUNTER — Encounter: Payer: Self-pay | Admitting: Cardiology

## 2013-08-23 DIAGNOSIS — F17200 Nicotine dependence, unspecified, uncomplicated: Secondary | ICD-10-CM | POA: Diagnosis not present

## 2013-08-23 DIAGNOSIS — I509 Heart failure, unspecified: Secondary | ICD-10-CM | POA: Diagnosis not present

## 2013-08-23 DIAGNOSIS — J441 Chronic obstructive pulmonary disease with (acute) exacerbation: Secondary | ICD-10-CM | POA: Diagnosis not present

## 2013-08-23 DIAGNOSIS — Z8249 Family history of ischemic heart disease and other diseases of the circulatory system: Secondary | ICD-10-CM | POA: Diagnosis not present

## 2013-08-23 DIAGNOSIS — J449 Chronic obstructive pulmonary disease, unspecified: Secondary | ICD-10-CM | POA: Diagnosis not present

## 2013-08-23 DIAGNOSIS — J9819 Other pulmonary collapse: Secondary | ICD-10-CM | POA: Diagnosis not present

## 2013-08-23 DIAGNOSIS — I10 Essential (primary) hypertension: Secondary | ICD-10-CM | POA: Diagnosis not present

## 2013-08-23 DIAGNOSIS — E119 Type 2 diabetes mellitus without complications: Secondary | ICD-10-CM | POA: Diagnosis not present

## 2013-08-23 DIAGNOSIS — R0602 Shortness of breath: Secondary | ICD-10-CM | POA: Diagnosis not present

## 2013-08-23 DIAGNOSIS — Z79899 Other long term (current) drug therapy: Secondary | ICD-10-CM | POA: Diagnosis not present

## 2013-08-24 DIAGNOSIS — J438 Other emphysema: Secondary | ICD-10-CM | POA: Diagnosis not present

## 2013-08-24 DIAGNOSIS — F411 Generalized anxiety disorder: Secondary | ICD-10-CM | POA: Diagnosis not present

## 2013-08-24 DIAGNOSIS — M67919 Unspecified disorder of synovium and tendon, unspecified shoulder: Secondary | ICD-10-CM | POA: Diagnosis not present

## 2013-08-24 DIAGNOSIS — I10 Essential (primary) hypertension: Secondary | ICD-10-CM | POA: Diagnosis not present

## 2013-08-24 DIAGNOSIS — M719 Bursopathy, unspecified: Secondary | ICD-10-CM | POA: Diagnosis not present

## 2013-08-24 DIAGNOSIS — F17200 Nicotine dependence, unspecified, uncomplicated: Secondary | ICD-10-CM | POA: Diagnosis not present

## 2013-08-24 DIAGNOSIS — E119 Type 2 diabetes mellitus without complications: Secondary | ICD-10-CM | POA: Diagnosis not present

## 2013-08-24 DIAGNOSIS — N400 Enlarged prostate without lower urinary tract symptoms: Secondary | ICD-10-CM | POA: Diagnosis not present

## 2013-09-03 DIAGNOSIS — F17200 Nicotine dependence, unspecified, uncomplicated: Secondary | ICD-10-CM | POA: Diagnosis present

## 2013-09-03 DIAGNOSIS — R404 Transient alteration of awareness: Secondary | ICD-10-CM | POA: Diagnosis not present

## 2013-09-03 DIAGNOSIS — I4891 Unspecified atrial fibrillation: Secondary | ICD-10-CM | POA: Diagnosis present

## 2013-09-03 DIAGNOSIS — I5042 Chronic combined systolic (congestive) and diastolic (congestive) heart failure: Secondary | ICD-10-CM | POA: Diagnosis not present

## 2013-09-03 DIAGNOSIS — E119 Type 2 diabetes mellitus without complications: Secondary | ICD-10-CM | POA: Diagnosis present

## 2013-09-03 DIAGNOSIS — R5381 Other malaise: Secondary | ICD-10-CM | POA: Diagnosis not present

## 2013-09-03 DIAGNOSIS — J984 Other disorders of lung: Secondary | ICD-10-CM | POA: Diagnosis not present

## 2013-09-03 DIAGNOSIS — J449 Chronic obstructive pulmonary disease, unspecified: Secondary | ICD-10-CM | POA: Diagnosis not present

## 2013-09-03 DIAGNOSIS — I498 Other specified cardiac arrhythmias: Secondary | ICD-10-CM | POA: Diagnosis not present

## 2013-09-03 DIAGNOSIS — Z79899 Other long term (current) drug therapy: Secondary | ICD-10-CM | POA: Diagnosis not present

## 2013-09-03 DIAGNOSIS — I10 Essential (primary) hypertension: Secondary | ICD-10-CM | POA: Diagnosis present

## 2013-09-03 DIAGNOSIS — R5383 Other fatigue: Secondary | ICD-10-CM | POA: Diagnosis not present

## 2013-09-03 DIAGNOSIS — Z8249 Family history of ischemic heart disease and other diseases of the circulatory system: Secondary | ICD-10-CM | POA: Diagnosis not present

## 2013-09-03 DIAGNOSIS — N179 Acute kidney failure, unspecified: Secondary | ICD-10-CM | POA: Diagnosis not present

## 2013-09-03 DIAGNOSIS — N400 Enlarged prostate without lower urinary tract symptoms: Secondary | ICD-10-CM | POA: Diagnosis present

## 2013-09-03 DIAGNOSIS — I4892 Unspecified atrial flutter: Secondary | ICD-10-CM | POA: Diagnosis not present

## 2013-09-03 DIAGNOSIS — J441 Chronic obstructive pulmonary disease with (acute) exacerbation: Secondary | ICD-10-CM | POA: Diagnosis present

## 2013-09-03 DIAGNOSIS — I428 Other cardiomyopathies: Secondary | ICD-10-CM | POA: Diagnosis present

## 2013-09-03 DIAGNOSIS — Z885 Allergy status to narcotic agent status: Secondary | ICD-10-CM | POA: Diagnosis not present

## 2013-09-03 DIAGNOSIS — J438 Other emphysema: Secondary | ICD-10-CM | POA: Diagnosis not present

## 2013-09-03 DIAGNOSIS — R079 Chest pain, unspecified: Secondary | ICD-10-CM | POA: Diagnosis not present

## 2013-09-03 DIAGNOSIS — I509 Heart failure, unspecified: Secondary | ICD-10-CM | POA: Diagnosis present

## 2013-09-03 DIAGNOSIS — I471 Supraventricular tachycardia: Secondary | ICD-10-CM | POA: Diagnosis not present

## 2013-09-10 ENCOUNTER — Encounter (HOSPITAL_COMMUNITY): Payer: Self-pay | Admitting: Emergency Medicine

## 2013-09-10 ENCOUNTER — Emergency Department (HOSPITAL_COMMUNITY)
Admission: EM | Admit: 2013-09-10 | Discharge: 2013-09-10 | Discharge status: Against Medical Advice | Payer: Medicare Other | Attending: Emergency Medicine | Admitting: Emergency Medicine

## 2013-09-10 DIAGNOSIS — F329 Major depressive disorder, single episode, unspecified: Secondary | ICD-10-CM | POA: Diagnosis not present

## 2013-09-10 DIAGNOSIS — F3289 Other specified depressive episodes: Secondary | ICD-10-CM | POA: Diagnosis not present

## 2013-09-10 DIAGNOSIS — I10 Essential (primary) hypertension: Secondary | ICD-10-CM | POA: Diagnosis not present

## 2013-09-10 DIAGNOSIS — E119 Type 2 diabetes mellitus without complications: Secondary | ICD-10-CM | POA: Insufficient documentation

## 2013-09-10 DIAGNOSIS — Z79899 Other long term (current) drug therapy: Secondary | ICD-10-CM | POA: Diagnosis not present

## 2013-09-10 DIAGNOSIS — Z8739 Personal history of other diseases of the musculoskeletal system and connective tissue: Secondary | ICD-10-CM | POA: Diagnosis not present

## 2013-09-10 DIAGNOSIS — J4489 Other specified chronic obstructive pulmonary disease: Secondary | ICD-10-CM | POA: Insufficient documentation

## 2013-09-10 DIAGNOSIS — Z046 Encounter for general psychiatric examination, requested by authority: Secondary | ICD-10-CM | POA: Insufficient documentation

## 2013-09-10 DIAGNOSIS — F1010 Alcohol abuse, uncomplicated: Secondary | ICD-10-CM | POA: Diagnosis not present

## 2013-09-10 DIAGNOSIS — J449 Chronic obstructive pulmonary disease, unspecified: Secondary | ICD-10-CM | POA: Insufficient documentation

## 2013-09-10 DIAGNOSIS — F17200 Nicotine dependence, unspecified, uncomplicated: Secondary | ICD-10-CM | POA: Insufficient documentation

## 2013-09-10 DIAGNOSIS — IMO0002 Reserved for concepts with insufficient information to code with codable children: Secondary | ICD-10-CM | POA: Insufficient documentation

## 2013-09-10 DIAGNOSIS — I4892 Unspecified atrial flutter: Secondary | ICD-10-CM | POA: Insufficient documentation

## 2013-09-10 LAB — CBC WITH DIFFERENTIAL/PLATELET
Basophils Absolute: 0 10*3/uL (ref 0.0–0.1)
Basophils Relative: 0 % (ref 0–1)
Eosinophils Absolute: 0.1 10*3/uL (ref 0.0–0.7)
Eosinophils Relative: 1 % (ref 0–5)
HCT: 43.1 % (ref 39.0–52.0)
Hemoglobin: 14.6 g/dL (ref 13.0–17.0)
Lymphocytes Relative: 31 % (ref 12–46)
Lymphs Abs: 2.9 10*3/uL (ref 0.7–4.0)
MCH: 35 pg — ABNORMAL HIGH (ref 26.0–34.0)
MCHC: 33.9 g/dL (ref 30.0–36.0)
MCV: 103.4 fL — ABNORMAL HIGH (ref 78.0–100.0)
Monocytes Absolute: 1 10*3/uL (ref 0.1–1.0)
Monocytes Relative: 11 % (ref 3–12)
Neutro Abs: 5.3 10*3/uL (ref 1.7–7.7)
Neutrophils Relative %: 57 % (ref 43–77)
Platelets: 223 10*3/uL (ref 150–400)
RBC: 4.17 MIL/uL — ABNORMAL LOW (ref 4.22–5.81)
RDW: 13 % (ref 11.5–15.5)
WBC: 9.3 10*3/uL (ref 4.0–10.5)

## 2013-09-10 LAB — RAPID URINE DRUG SCREEN, HOSP PERFORMED
Amphetamines: NOT DETECTED
Barbiturates: NOT DETECTED
Benzodiazepines: NOT DETECTED
Cocaine: NOT DETECTED
Opiates: NOT DETECTED
Tetrahydrocannabinol: NOT DETECTED

## 2013-09-10 LAB — COMPREHENSIVE METABOLIC PANEL
ALT: 25 U/L (ref 0–53)
AST: 15 U/L (ref 0–37)
Albumin: 3.7 g/dL (ref 3.5–5.2)
Alkaline Phosphatase: 72 U/L (ref 39–117)
Anion gap: 20 — ABNORMAL HIGH (ref 5–15)
BUN: 31 mg/dL — ABNORMAL HIGH (ref 6–23)
CO2: 22 mEq/L (ref 19–32)
Calcium: 9 mg/dL (ref 8.4–10.5)
Chloride: 96 mEq/L (ref 96–112)
Creatinine, Ser: 1.54 mg/dL — ABNORMAL HIGH (ref 0.50–1.35)
GFR calc Af Amer: 55 mL/min — ABNORMAL LOW (ref >90)
GFR calc non Af Amer: 47 mL/min — ABNORMAL LOW (ref >90)
Glucose, Bld: 121 mg/dL — ABNORMAL HIGH (ref 70–99)
Potassium: 3.9 mEq/L (ref 3.7–5.3)
Sodium: 138 mEq/L (ref 137–147)
Total Bilirubin: <0.2 mg/dL — ABNORMAL LOW (ref 0.3–1.2)
Total Protein: 6.9 g/dL (ref 6.0–8.3)

## 2013-09-10 LAB — ETHANOL: Alcohol, Ethyl (B): 225 mg/dL — ABNORMAL HIGH (ref 0–11)

## 2013-09-10 MED ORDER — VITAMIN B-1 100 MG PO TABS: 100.0000 mg | ORAL_TABLET | Freq: Every day | ORAL | Status: DC

## 2013-09-10 MED ORDER — LORAZEPAM 1 MG PO TABS: 0.0000 mg | ORAL_TABLET | Freq: Four times a day (QID) | ORAL | Status: DC

## 2013-09-10 MED ORDER — THIAMINE HCL 100 MG/ML IJ SOLN: 100.0000 mg | Freq: Every day | INTRAMUSCULAR | Status: DC

## 2013-09-10 MED ORDER — LORAZEPAM 1 MG PO TABS: 0.0000 mg | ORAL_TABLET | Freq: Two times a day (BID) | ORAL | Status: DC

## 2013-09-10 NOTE — Discharge Instructions (Signed)
Alcohol Use Disorder Alcohol use disorder is a mental disorder. It is not a one-time incident of heavy drinking. Alcohol use disorder is the excessive and uncontrollable use of alcohol over time that leads to problems with functioning in one or more areas of daily living. People with this disorder risk harming themselves and others when they drink to excess. Alcohol use disorder also can cause other mental disorders, such as mood and anxiety disorders, and serious physical problems. People with alcohol use disorder often misuse other drugs.  Alcohol use disorder is common and widespread. Some people with this disorder drink alcohol to cope with or escape from negative life events. Others drink to relieve chronic pain or symptoms of mental illness. People with a family history of alcohol use disorder are at higher risk of losing control and using alcohol to excess.  SYMPTOMS  Signs and symptoms of alcohol use disorder may include the following:   Consumption ofalcohol inlarger amounts or over a longer period of time than intended.  Multiple unsuccessful attempts to cutdown or control alcohol use.   A great deal of time spent obtaining alcohol, using alcohol, or recovering from the effects of alcohol (hangover).  A strong desire or urge to use alcohol (cravings).   Continued use of alcohol despite problems at work, school, or home because of alcohol use.   Continued use of alcohol despite problems in relationships because of alcohol use.  Continued use of alcohol in situations when it is physically hazardous, such as driving a car.  Continued use of alcohol despite awareness of a physical or psychological problem that is likely related to alcohol use. Physical problems related to alcohol use can involve the brain, heart, liver, stomach, and intestines. Psychological problems related to alcohol use include intoxication, depression, anxiety, psychosis, delirium, and dementia.   The need for  increased amounts of alcohol to achieve the same desired effect, or a decreased effect from the consumption of the same amount of alcohol (tolerance).  Withdrawal symptoms upon reducing or stopping alcohol use, or alcohol use to reduce or avoid withdrawal symptoms. Withdrawal symptoms include:  Racing heart.  Hand tremor.  Difficulty sleeping.  Nausea.  Vomiting.  Hallucinations.  Restlessness.  Seizures. DIAGNOSIS Alcohol use disorder is diagnosed through an assessment by your health care provider. Your health care provider may start by asking three or four questions to screen for excessive or problematic alcohol use. To confirm a diagnosis of alcohol use disorder, at least two symptoms must be present within a 12-month period. The severity of alcohol use disorder depends on the number of symptoms:  Mild--two or three.  Moderate--four or five.  Severe--six or more. Your health care provider may perform a physical exam or use results from lab tests to see if you have physical problems resulting from alcohol use. Your health care provider may refer you to a mental health professional for evaluation. TREATMENT  Some people with alcohol use disorder are able to reduce their alcohol use to low-risk levels. Some people with alcohol use disorder need to quit drinking alcohol. When necessary, mental health professionals with specialized training in substance use treatment can help. Your health care provider can help you decide how severe your alcohol use disorder is and what type of treatment you need. The following forms of treatment are available:   Detoxification. Detoxification involves the use of prescription medicines to prevent alcohol withdrawal symptoms in the first week after quitting. This is important for people with a history of symptoms   of withdrawal and for heavy drinkers who are likely to have withdrawal symptoms. Alcohol withdrawal can be dangerous and, in severe cases, cause  death. Detoxification is usually provided in a hospital or in-patient substance use treatment facility.  Counseling or talk therapy. Talk therapy is provided by substance use treatment counselors. It addresses the reasons people use alcohol and ways to keep them from drinking again. The goals of talk therapy are to help people with alcohol use disorder find healthy activities and ways to cope with life stress, to identify and avoid triggers for alcohol use, and to handle cravings, which can cause relapse.  Medicines.Different medicines can help treat alcohol use disorder through the following actions:  Decrease alcohol cravings.  Decrease the positive reward response felt from alcohol use.  Produce an uncomfortable physical reaction when alcohol is used (aversion therapy).  Support groups. Support groups are run by people who have quit drinking. They provide emotional support, advice, and guidance. These forms of treatment are often combined. Some people with alcohol use disorder benefit from intensive combination treatment provided by specialized substance use treatment centers. Both inpatient and outpatient treatment programs are available. Document Released: 02/16/2004 Document Revised: 05/25/2013 Document Reviewed: 04/17/2012 Exit[employer] Patient Information 2015 [employer]. This information is not intended to replace advice given to you by your health care provider. Make sure you discuss any questions you have with your health care provider.  

## 2013-09-10 NOTE — ED Notes (Signed)
Pt wants to leave AMA, d/t long process of waiting for bed for alcohol abuse rehabilitation and wishes to leave, explain to pt there is a process, he states he wants to go home, we can't give him the care he needs fast enough.

## 2013-09-10 NOTE — ED Provider Notes (Signed)
CSN: [id number]     Arrival date & time 09/10/13  1809 History   First MD Initiated Contact with Patient 09/10/13 1811     Chief Complaint  Patient presents with  . V70.1     (Consider location/radiation/quality/duration/timing/severity/associated sxs/prior Treatment) HPI 63 year old male presents requesting alcohol detox. He states he's been drinking alcohol since he was 63 years old. Most recently drank earlier this morning, drank about a 12 pack of beer. Patient denies having ever gone and withdrawal seizures. He states he's had trembling before but is not currently having this. He was recently discharged from an outside facility 3 days ago for atrial fibrillation with RVR. Patient is currently denying chest pain or shortness of breath and is otherwise feeling well. He states his been to multiple facilities but never gets into rehabilitation anywhere. He has been to multiple [place] facilities. Patient denies suicidal or homicidal thoughts.  Past Medical History  Diagnosis Date  . Atrial flutter     s/p CTI ablation by Dr [name] 10/13  . Alcohol abuse     Quit 08/23/11  . Essential hypertension, benign   . Type 2 diabetes mellitus   . Cardiomyopathy     LVEF 25-30% 8/13, previously normal by dobutamine echo 5/13  . COPD (chronic obstructive pulmonary disease)   . Depression   . Arthritis    Past Surgical History  Procedure Laterality Date  . Tonsillectomy    . Appendectomy    . Atrial ablation surgery  11/06/2011    CTI ablation for atrial flutter by Dr [name]  . Cataracts     Family History  Problem Relation Age of Onset  . Diabetes Sister   . Hypertension Mother   . Hypertension Father   . Hypertension Sister   . Hypertension Sister   . Hypertension Sister   . Heart attack Father   . Cancer Mother     lung  . Pulmonary fibrosis Sister    History  Substance Use Topics  . Smoking status: Current Every Day Smoker -- 0.50 packs/day for 47 years    Types: Cigarettes  .  Smokeless tobacco: Never Used     Comment: encouraged to quit today 03/04/12  . Alcohol Use: Yes     Comment: current very heavy ETOH, quit 08/23/11    Review of Systems  Respiratory: Negative for shortness of breath.   Cardiovascular: Negative for chest pain.  Gastrointestinal: Negative for vomiting.  Neurological: Negative for tremors and seizures.  Psychiatric/Behavioral: Negative for suicidal ideas.      Allergies  Codeine  Home Medications   Prior to Admission medications   Medication Sig Start Date End Date Taking? Authorizing Provider  beclomethasone (QVAR) 80 MCG/ACT inhaler Inhale 1 puff into the lungs 2 (two) times daily.   Yes Historical Provider, MD  carvedilol (COREG) 12.5 MG tablet Take 25 mg by mouth 2 (two) times daily with a meal.   Yes Historical Provider, MD  citalopram (CELEXA) 20 MG tablet Take 20 mg by mouth daily.    Yes Historical Provider, MD  diltiazem (CARDIZEM) 90 MG tablet Take 90 mg by mouth every 6 (six) hours.   Yes Historical Provider, MD  furosemide (LASIX) 40 MG tablet Take 40 mg by mouth daily.   Yes Historical Provider, MD  ipratropium-albuterol (DUONEB) 0.5-2.5 (3) MG/3ML SOLN Take 3 mLs by nebulization every 6 (six) hours as needed (shortness of breath/wheezing).   Yes Historical Provider, MD  metFORMIN (GLUCOPHAGE) 500 MG tablet Take 500 mg by  mouth 3 (three) times daily.   Yes Historical Provider, MD  nabumetone (RELAFEN) 500 MG tablet Take 500 mg by mouth 2 (two) times daily.   Yes Historical Provider, MD  potassium chloride SA (K-DUR,KLOR-CON) 20 MEQ tablet Take 20 mEq by mouth daily.   Yes Historical Provider, MD  predniSONE (STERAPRED UNI-PAK) 5 MG TABS tablet Take 5 mg by mouth as directed. 09/08/13  Yes Historical Provider, MD  Rivaroxaban (XARELTO) 15 MG TABS tablet Take 15 mg by mouth daily.   Yes Historical Provider, MD  tamsulosin (FLOMAX) 0.4 MG CAPS capsule Take 0.4 mg by mouth daily. 08/25/13  Yes Historical Provider, MD  albuterol  (PROVENTIL HFA;VENTOLIN HFA) 108 (90 BASE) MCG/ACT inhaler Inhale 2 puffs into the lungs every 6 (six) hours as needed. For shortness of breath    Historical Provider, MD  losartan (COZAAR) 50 MG tablet Take 50 mg by mouth daily.    Historical Provider, MD   BP 102/65  Pulse 104  Temp(Src) 97.6 F (36.4 C) (Oral)  Ht 6\' 1"  (1.854 m)  Wt 250 lb (113.399 kg)  BMI 32.99 kg/m2  SpO2 93% Physical Exam  Nursing note and vitals reviewed. Constitutional: He is oriented to person, place, and time. He appears well-developed and well-nourished.  HENT:  Head: Normocephalic and atraumatic.  Right Ear: External ear normal.  Left Ear: External ear normal.  Nose: Nose normal.  Eyes: Right eye exhibits no discharge. Left eye exhibits no discharge.  Neck: Neck supple.  Cardiovascular: Normal rate, regular rhythm, normal heart sounds and intact distal pulses.   Pulmonary/Chest: Effort normal and breath sounds normal.  Abdominal: Soft. He exhibits no distension. There is no tenderness.  Musculoskeletal: He exhibits no edema.  Neurological: He is alert and oriented to person, place, and time. He displays no tremor.  Skin: Skin is warm and dry.    ED Course  Procedures (including critical care time) Labs Review Labs Reviewed  CBC WITH DIFFERENTIAL - Abnormal; Notable for the following:    RBC 4.17 (*)    MCV 103.4 (*)    MCH 35.0 (*)    All other components within normal limits  COMPREHENSIVE METABOLIC PANEL - Abnormal; Notable for the following:    Glucose, Bld 121 (*)    BUN 31 (*)    Creatinine, Ser 1.54 (*)    Total Bilirubin <0.2 (*)    GFR calc non Af Amer 47 (*)    GFR calc Af Amer 55 (*)    Anion gap 20 (*)    All other components within normal limits  ETHANOL - Abnormal; Notable for the following:    Alcohol, Ethyl (B) 225 (*)    All other components within normal limits  URINE RAPID DRUG SCREEN (HOSP PERFORMED)    Imaging Review No results found.   EKG  Interpretation None      MDM   Final diagnoses:  Alcohol abuse    Patient has medical decision-making capacity. He has an elevated alcohol level at this time does not appear intoxicated. He's able to walk without difficulty. He has decided to leave because he believes that this process is too slow. I discussed that he needs to take time and we will work on trying to get him into an appropriate rehabilitation facility. Patient still declines this and wants to leave. He is not suicidal and I believe he is stable for discharge.    Ephraim Hamburger, MD 09/10/13 2147

## 2013-09-10 NOTE — ED Notes (Signed)
Pt wants "some help" to quit drinking, has drank 7- 24 oz beers today, recently at [facility], states no one will help him, denies SI/HI

## 2013-09-12 DIAGNOSIS — I4892 Unspecified atrial flutter: Secondary | ICD-10-CM | POA: Diagnosis not present

## 2013-09-12 DIAGNOSIS — J441 Chronic obstructive pulmonary disease with (acute) exacerbation: Secondary | ICD-10-CM | POA: Diagnosis not present

## 2013-09-12 DIAGNOSIS — R0602 Shortness of breath: Secondary | ICD-10-CM | POA: Diagnosis not present

## 2013-09-12 DIAGNOSIS — J449 Chronic obstructive pulmonary disease, unspecified: Secondary | ICD-10-CM | POA: Diagnosis not present

## 2013-09-14 DIAGNOSIS — J449 Chronic obstructive pulmonary disease, unspecified: Secondary | ICD-10-CM | POA: Diagnosis not present

## 2013-09-14 DIAGNOSIS — I4892 Unspecified atrial flutter: Secondary | ICD-10-CM | POA: Diagnosis not present

## 2013-09-15 DIAGNOSIS — I4892 Unspecified atrial flutter: Secondary | ICD-10-CM | POA: Diagnosis not present

## 2013-09-15 DIAGNOSIS — I4891 Unspecified atrial fibrillation: Secondary | ICD-10-CM | POA: Diagnosis not present

## 2013-09-16 DIAGNOSIS — I4892 Unspecified atrial flutter: Secondary | ICD-10-CM | POA: Diagnosis not present

## 2013-09-16 DIAGNOSIS — Z9889 Other specified postprocedural states: Secondary | ICD-10-CM | POA: Diagnosis not present

## 2013-09-18 DIAGNOSIS — F411 Generalized anxiety disorder: Secondary | ICD-10-CM | POA: Diagnosis not present

## 2013-09-23 DIAGNOSIS — I509 Heart failure, unspecified: Secondary | ICD-10-CM | POA: Diagnosis not present

## 2013-09-23 DIAGNOSIS — J189 Pneumonia, unspecified organism: Secondary | ICD-10-CM | POA: Diagnosis not present

## 2013-09-23 DIAGNOSIS — R0602 Shortness of breath: Secondary | ICD-10-CM | POA: Diagnosis not present

## 2013-09-23 DIAGNOSIS — R0609 Other forms of dyspnea: Secondary | ICD-10-CM | POA: Diagnosis not present

## 2013-09-23 DIAGNOSIS — F17200 Nicotine dependence, unspecified, uncomplicated: Secondary | ICD-10-CM | POA: Diagnosis not present

## 2013-09-23 DIAGNOSIS — R0989 Other specified symptoms and signs involving the circulatory and respiratory systems: Secondary | ICD-10-CM | POA: Diagnosis not present

## 2013-09-23 DIAGNOSIS — J449 Chronic obstructive pulmonary disease, unspecified: Secondary | ICD-10-CM | POA: Diagnosis not present

## 2013-09-24 DIAGNOSIS — J189 Pneumonia, unspecified organism: Secondary | ICD-10-CM | POA: Diagnosis not present

## 2013-09-24 DIAGNOSIS — E860 Dehydration: Secondary | ICD-10-CM | POA: Diagnosis not present

## 2013-09-25 DIAGNOSIS — R69 Illness, unspecified: Secondary | ICD-10-CM | POA: Diagnosis not present

## 2013-09-25 DIAGNOSIS — J189 Pneumonia, unspecified organism: Secondary | ICD-10-CM | POA: Diagnosis not present

## 2013-09-26 DIAGNOSIS — Z8701 Personal history of pneumonia (recurrent): Secondary | ICD-10-CM | POA: Diagnosis not present

## 2013-09-26 DIAGNOSIS — F17200 Nicotine dependence, unspecified, uncomplicated: Secondary | ICD-10-CM | POA: Diagnosis not present

## 2013-09-26 DIAGNOSIS — R0602 Shortness of breath: Secondary | ICD-10-CM | POA: Diagnosis not present

## 2013-09-26 DIAGNOSIS — I4891 Unspecified atrial fibrillation: Secondary | ICD-10-CM | POA: Diagnosis not present

## 2013-09-26 DIAGNOSIS — Z79899 Other long term (current) drug therapy: Secondary | ICD-10-CM | POA: Diagnosis not present

## 2013-09-26 DIAGNOSIS — J449 Chronic obstructive pulmonary disease, unspecified: Secondary | ICD-10-CM | POA: Diagnosis not present

## 2013-09-26 DIAGNOSIS — I509 Heart failure, unspecified: Secondary | ICD-10-CM | POA: Diagnosis not present

## 2013-09-26 DIAGNOSIS — Z7901 Long term (current) use of anticoagulants: Secondary | ICD-10-CM | POA: Diagnosis not present

## 2013-09-29 ENCOUNTER — Emergency Department (HOSPITAL_COMMUNITY)
Admission: EM | Admit: 2013-09-29 | Discharge: 2013-09-30 | Disposition: A | Discharge status: Home / Self Care | Payer: Medicare Other | Attending: Emergency Medicine | Admitting: Emergency Medicine

## 2013-09-29 ENCOUNTER — Encounter (HOSPITAL_COMMUNITY): Payer: Self-pay | Admitting: Emergency Medicine

## 2013-09-29 DIAGNOSIS — J4489 Other specified chronic obstructive pulmonary disease: Secondary | ICD-10-CM | POA: Insufficient documentation

## 2013-09-29 DIAGNOSIS — M129 Arthropathy, unspecified: Secondary | ICD-10-CM | POA: Diagnosis not present

## 2013-09-29 DIAGNOSIS — F32A Depression, unspecified: Secondary | ICD-10-CM

## 2013-09-29 DIAGNOSIS — R45851 Suicidal ideations: Secondary | ICD-10-CM | POA: Insufficient documentation

## 2013-09-29 DIAGNOSIS — F17200 Nicotine dependence, unspecified, uncomplicated: Secondary | ICD-10-CM | POA: Insufficient documentation

## 2013-09-29 DIAGNOSIS — Z008 Encounter for other general examination: Secondary | ICD-10-CM | POA: Diagnosis present

## 2013-09-29 DIAGNOSIS — J449 Chronic obstructive pulmonary disease, unspecified: Secondary | ICD-10-CM | POA: Diagnosis not present

## 2013-09-29 DIAGNOSIS — F1010 Alcohol abuse, uncomplicated: Secondary | ICD-10-CM | POA: Insufficient documentation

## 2013-09-29 DIAGNOSIS — E119 Type 2 diabetes mellitus without complications: Secondary | ICD-10-CM | POA: Diagnosis not present

## 2013-09-29 DIAGNOSIS — F3289 Other specified depressive episodes: Secondary | ICD-10-CM | POA: Diagnosis not present

## 2013-09-29 DIAGNOSIS — Z79899 Other long term (current) drug therapy: Secondary | ICD-10-CM | POA: Diagnosis not present

## 2013-09-29 DIAGNOSIS — F489 Nonpsychotic mental disorder, unspecified: Secondary | ICD-10-CM | POA: Diagnosis not present

## 2013-09-29 DIAGNOSIS — F329 Major depressive disorder, single episode, unspecified: Secondary | ICD-10-CM | POA: Diagnosis not present

## 2013-09-29 DIAGNOSIS — I10 Essential (primary) hypertension: Secondary | ICD-10-CM | POA: Insufficient documentation

## 2013-09-29 DIAGNOSIS — I4892 Unspecified atrial flutter: Secondary | ICD-10-CM | POA: Insufficient documentation

## 2013-09-29 DIAGNOSIS — F29 Unspecified psychosis not due to a substance or known physiological condition: Secondary | ICD-10-CM | POA: Diagnosis not present

## 2013-09-29 DIAGNOSIS — Z7901 Long term (current) use of anticoagulants: Secondary | ICD-10-CM | POA: Diagnosis not present

## 2013-09-29 DIAGNOSIS — F10920 Alcohol use, unspecified with intoxication, uncomplicated: Secondary | ICD-10-CM

## 2013-09-29 LAB — COMPREHENSIVE METABOLIC PANEL
ALT: 25 U/L (ref 0–53)
AST: 21 U/L (ref 0–37)
Albumin: 3.7 g/dL (ref 3.5–5.2)
Alkaline Phosphatase: 68 U/L (ref 39–117)
Anion gap: 16 — ABNORMAL HIGH (ref 5–15)
BUN: 20 mg/dL (ref 6–23)
CO2: 25 mEq/L (ref 19–32)
Calcium: 9.3 mg/dL (ref 8.4–10.5)
Chloride: 100 mEq/L (ref 96–112)
Creatinine, Ser: 1.23 mg/dL (ref 0.50–1.35)
GFR calc Af Amer: 72 mL/min — ABNORMAL LOW (ref >90)
GFR calc non Af Amer: 62 mL/min — ABNORMAL LOW (ref >90)
Glucose, Bld: 154 mg/dL — ABNORMAL HIGH (ref 70–99)
Potassium: 4 mEq/L (ref 3.7–5.3)
Sodium: 141 mEq/L (ref 137–147)
Total Bilirubin: 0.2 mg/dL — ABNORMAL LOW (ref 0.3–1.2)
Total Protein: 6.9 g/dL (ref 6.0–8.3)

## 2013-09-29 LAB — CBC
HCT: 43 % (ref 39.0–52.0)
Hemoglobin: 14.9 g/dL (ref 13.0–17.0)
MCH: 35.8 pg — ABNORMAL HIGH (ref 26.0–34.0)
MCHC: 34.7 g/dL (ref 30.0–36.0)
MCV: 103.4 fL — ABNORMAL HIGH (ref 78.0–100.0)
Platelets: 192 10*3/uL (ref 150–400)
RBC: 4.16 MIL/uL — ABNORMAL LOW (ref 4.22–5.81)
RDW: 13.2 % (ref 11.5–15.5)
WBC: 9 10*3/uL (ref 4.0–10.5)

## 2013-09-29 LAB — ETHANOL: Alcohol, Ethyl (B): 185 mg/dL — ABNORMAL HIGH (ref 0–11)

## 2013-09-29 MED ORDER — FUROSEMIDE 40 MG PO TABS
40.0000 mg | ORAL_TABLET | Freq: Every day | ORAL | Status: DC
  Administered 2013-09-30: 40 mg via ORAL
  Filled 2013-09-29: qty 1

## 2013-09-29 MED ORDER — THIAMINE HCL 100 MG/ML IJ SOLN
100.0000 mg | Freq: Once | INTRAMUSCULAR | Status: DC
Start: 2013-09-29 — End: 2013-09-30

## 2013-09-29 MED ORDER — LORAZEPAM 1 MG PO TABS
0.0000 mg | ORAL_TABLET | Freq: Two times a day (BID) | ORAL | Status: DC
Start: 2013-10-01 — End: 2013-09-30

## 2013-09-29 MED ORDER — IPRATROPIUM-ALBUTEROL 0.5-2.5 (3) MG/3ML IN SOLN
3.0000 mL | Freq: Four times a day (QID) | RESPIRATORY_TRACT | Status: DC | PRN
  Administered 2013-09-30 (×2): 3 mL via RESPIRATORY_TRACT
  Filled 2013-09-29 (×2): qty 3

## 2013-09-29 MED ORDER — ALBUTEROL SULFATE HFA 108 (90 BASE) MCG/ACT IN AERS
2.0000 | INHALATION_SPRAY | Freq: Four times a day (QID) | RESPIRATORY_TRACT | Status: DC | PRN
Start: 2013-09-29 — End: 2013-09-30
  Administered 2013-09-30: 2 via RESPIRATORY_TRACT
  Filled 2013-09-29: qty 6.7

## 2013-09-29 MED ORDER — SODIUM CHLORIDE 0.9 % IV BOLUS (SEPSIS)
1000.0000 mL | Freq: Once | INTRAVENOUS | Status: AC
  Administered 2013-09-29: 1000 mL via INTRAVENOUS

## 2013-09-29 MED ORDER — DILTIAZEM HCL 30 MG PO TABS
90.0000 mg | ORAL_TABLET | Freq: Four times a day (QID) | ORAL | Status: DC
  Filled 2013-09-29: qty 3

## 2013-09-29 MED ORDER — POTASSIUM CHLORIDE CRYS ER 20 MEQ PO TBCR
20.0000 meq | EXTENDED_RELEASE_TABLET | Freq: Every day | ORAL | Status: DC
  Administered 2013-09-30: 20 meq via ORAL
  Filled 2013-09-29: qty 1

## 2013-09-29 MED ORDER — METFORMIN HCL 500 MG PO TABS
500.0000 mg | ORAL_TABLET | Freq: Three times a day (TID) | ORAL | Status: DC
  Administered 2013-09-30 (×2): 500 mg via ORAL
  Filled 2013-09-29 (×2): qty 1

## 2013-09-29 MED ORDER — FOLIC ACID 1 MG PO TABS
1.0000 mg | ORAL_TABLET | Freq: Every day | ORAL | Status: DC
  Administered 2013-09-30: 1 mg via ORAL
  Filled 2013-09-29: qty 1

## 2013-09-29 MED ORDER — RIVAROXABAN 15 MG PO TABS
15.0000 mg | ORAL_TABLET | Freq: Every day | ORAL | Status: DC
  Administered 2013-09-30: 15 mg via ORAL
  Filled 2013-09-29 (×3): qty 1

## 2013-09-29 MED ORDER — TAMSULOSIN HCL 0.4 MG PO CAPS
0.4000 mg | ORAL_CAPSULE | Freq: Every day | ORAL | Status: DC
  Administered 2013-09-30: 0.4 mg via ORAL
  Filled 2013-09-29: qty 1

## 2013-09-29 MED ORDER — LORAZEPAM 1 MG PO TABS: 0.0000 mg | ORAL_TABLET | Freq: Four times a day (QID) | ORAL | Status: DC

## 2013-09-29 MED ORDER — LORAZEPAM 1 MG PO TABS: 1.0000 mg | ORAL_TABLET | Freq: Four times a day (QID) | ORAL | Status: DC | PRN

## 2013-09-29 MED ORDER — FLUTICASONE PROPIONATE HFA 44 MCG/ACT IN AERO
1.0000 | INHALATION_SPRAY | Freq: Two times a day (BID) | RESPIRATORY_TRACT | Status: DC
  Filled 2013-09-29 (×2): qty 10.6

## 2013-09-29 MED ORDER — LORAZEPAM 2 MG/ML IJ SOLN: 1.0000 mg | Freq: Four times a day (QID) | INTRAMUSCULAR | Status: DC | PRN

## 2013-09-29 MED ORDER — ADULT MULTIVITAMIN W/MINERALS CH
1.0000 | ORAL_TABLET | Freq: Every day | ORAL | Status: DC
  Administered 2013-09-30: 1 via ORAL
  Filled 2013-09-29: qty 1

## 2013-09-29 MED ORDER — NABUMETONE 500 MG PO TABS
500.0000 mg | ORAL_TABLET | Freq: Two times a day (BID) | ORAL | Status: DC
  Administered 2013-09-30: 500 mg via ORAL
  Filled 2013-09-29 (×6): qty 1

## 2013-09-29 MED ORDER — THIAMINE HCL 100 MG/ML IJ SOLN: 100.0000 mg | Freq: Every day | INTRAMUSCULAR | Status: DC

## 2013-09-29 MED ORDER — CITALOPRAM HYDROBROMIDE 20 MG PO TABS
20.0000 mg | ORAL_TABLET | Freq: Every day | ORAL | Status: DC
  Administered 2013-09-30: 20 mg via ORAL
  Filled 2013-09-29 (×3): qty 1

## 2013-09-29 MED ORDER — VITAMIN B-1 100 MG PO TABS
100.0000 mg | ORAL_TABLET | Freq: Every day | ORAL | Status: DC
  Administered 2013-09-30: 100 mg via ORAL
  Filled 2013-09-29: qty 1

## 2013-09-29 MED ORDER — CARVEDILOL 12.5 MG PO TABS
25.0000 mg | ORAL_TABLET | Freq: Two times a day (BID) | ORAL | Status: DC
  Administered 2013-09-30: 25 mg via ORAL
  Filled 2013-09-29: qty 2
  Filled 2013-09-29: qty 1
  Filled 2013-09-29: qty 2
  Filled 2013-09-29 (×2): qty 1

## 2013-09-29 MED ORDER — LOSARTAN POTASSIUM 50 MG PO TABS
50.0000 mg | ORAL_TABLET | Freq: Every day | ORAL | Status: DC
  Filled 2013-09-29 (×3): qty 1

## 2013-09-29 NOTE — ED Notes (Signed)
Patient wanded by security and placed in paper scrubs. Belongings placed in bags to be locked up. Patient taken to restroom unable to void at this time.

## 2013-09-29 NOTE — ED Provider Notes (Signed)
CSN: [id number]     Arrival date & time 09/29/13  2154 History  This chart was scribed for Dorie Rank, MD by Delphia Grates, ED Scribe. This patient was seen in room AP[facility]6/AP[facility]6 and the patient's care was started at 10:13 PM.    Chief Complaint  Patient presents with  . V70.1    The history is provided by the patient. No language interpreter was used.    HPI Comments: Chase Herrera is a 63 y.o. male brought in by ambulance, who presents to the Emergency Department for EtOH detoxification. Patient states that there is "alot going on". He reports he has had several family member pass way, including his father approximately 7 months ago. Patient states he has been on an "alcohol kick" and "cannot get off of it". Patient also reports he was admitted to the hospital for unspecified heart problems, and has been unable to make an appointment for a follow-up. There is associated SI without a plan. Patient has been placed in a detoxification center in the past. He reports his last drink was 1 hour ago. Patient is a current .5pp smoker, and has history of DM, arthritis, COPD, depression, and EtOH anuse  Past Medical History  Diagnosis Date  . Atrial flutter     s/p CTI ablation by Dr [name] 10/13  . Alcohol abuse     Quit 08/23/11  . Essential hypertension, benign   . Type 2 diabetes mellitus   . Cardiomyopathy     LVEF 25-30% 8/13, previously normal by dobutamine echo 5/13  . COPD (chronic obstructive pulmonary disease)   . Depression   . Arthritis    Past Surgical History  Procedure Laterality Date  . Tonsillectomy    . Appendectomy    . Atrial ablation surgery  11/06/2011    CTI ablation for atrial flutter by Dr [name]  . Cataracts     Family History  Problem Relation Age of Onset  . Diabetes Sister   . Hypertension Mother   . Hypertension Father   . Hypertension Sister   . Hypertension Sister   . Hypertension Sister   . Heart attack Father   . Cancer Mother     lung  .  Pulmonary fibrosis Sister    History  Substance Use Topics  . Smoking status: Current Every Day Smoker -- 0.50 packs/day for 47 years    Types: Cigarettes  . Smokeless tobacco: Never Used     Comment: encouraged to quit today 03/04/12  . Alcohol Use: Yes     Comment: current very heavy ETOH, quit 08/23/11    Review of Systems A complete 10 system review of systems was obtained and all systems are negative except as noted in the HPI and PMH.     Allergies  Codeine  Home Medications   Prior to Admission medications   Medication Sig Start Date End Date Taking? Authorizing Provider  albuterol (PROVENTIL HFA;VENTOLIN HFA) 108 (90 BASE) MCG/ACT inhaler Inhale 2 puffs into the lungs every 6 (six) hours as needed. For shortness of breath    Historical Provider, MD  beclomethasone (QVAR) 80 MCG/ACT inhaler Inhale 1 puff into the lungs 2 (two) times daily.    Historical Provider, MD  carvedilol (COREG) 12.5 MG tablet Take 25 mg by mouth 2 (two) times daily with a meal.    Historical Provider, MD  citalopram (CELEXA) 20 MG tablet Take 20 mg by mouth daily.     Historical Provider, MD  diltiazem (CARDIZEM) 90  MG tablet Take 90 mg by mouth every 6 (six) hours.    Historical Provider, MD  furosemide (LASIX) 40 MG tablet Take 40 mg by mouth daily.    Historical Provider, MD  ipratropium-albuterol (DUONEB) 0.5-2.5 (3) MG/3ML SOLN Take 3 mLs by nebulization every 6 (six) hours as needed (shortness of breath/wheezing).    Historical Provider, MD  losartan (COZAAR) 50 MG tablet Take 50 mg by mouth daily.    Historical Provider, MD  metFORMIN (GLUCOPHAGE) 500 MG tablet Take 500 mg by mouth 3 (three) times daily.    Historical Provider, MD  nabumetone (RELAFEN) 500 MG tablet Take 500 mg by mouth 2 (two) times daily.    Historical Provider, MD  potassium chloride SA (K-DUR,KLOR-CON) 20 MEQ tablet Take 20 mEq by mouth daily.    Historical Provider, MD  predniSONE (STERAPRED UNI-PAK) 5 MG TABS tablet Take 5  mg by mouth as directed. 09/08/13   Historical Provider, MD  Rivaroxaban (XARELTO) 15 MG TABS tablet Take 15 mg by mouth daily.    Historical Provider, MD  tamsulosin (FLOMAX) 0.4 MG CAPS capsule Take 0.4 mg by mouth daily. 08/25/13   Historical Provider, MD   Triage Vitals: BP 130/74  Pulse 82  Temp(Src) 97.7 F (36.5 C) (Oral)  Resp 20  Ht 6\' 1"  (1.854 m)  Wt 238 lb (107.956 kg)  BMI 31.41 kg/m2  SpO2 95%  Physical Exam  Nursing note and vitals reviewed. Constitutional: No distress.  Obese  HENT:  Head: Normocephalic and atraumatic.  Right Ear: External ear normal.  Left Ear: External ear normal.  Eyes: Conjunctivae are normal. Right eye exhibits no discharge. Left eye exhibits no discharge. No scleral icterus.  Neck: Neck supple. No tracheal deviation present.  Cardiovascular: Normal rate, regular rhythm and intact distal pulses.   Pulmonary/Chest: Effort normal and breath sounds normal. No stridor. No respiratory distress. He has no wheezes. He has no rales.  Abdominal: Soft. Bowel sounds are normal. He exhibits no distension. There is no tenderness. There is no rebound and no guarding.  Musculoskeletal: He exhibits no edema and no tenderness.  Neurological: He is alert. He has normal strength. No cranial nerve deficit (no facial droop, extraocular movements intact, no slurred speech) or sensory deficit. He exhibits normal muscle tone. He displays no seizure activity. Coordination normal.  Skin: Skin is warm and dry. No rash noted.  Psychiatric: His speech is not slurred. He exhibits a depressed mood. He expresses suicidal ideation. He expresses no homicidal ideation. He expresses no suicidal plans and no homicidal plans.    ED Course  Procedures (including critical care time)  DIAGNOSTIC STUDIES: Oxygen Saturation is 95% on room air, adequate by my interpretation.    COORDINATION OF CARE: At 2218 Discussed treatment plan with patient which includes labs. Patient agrees.     Labs Review Labs Reviewed  CBC - Abnormal; Notable for the following:    RBC 4.16 (*)    MCV 103.4 (*)    MCH 35.8 (*)    All other components within normal limits  COMPREHENSIVE METABOLIC PANEL - Abnormal; Notable for the following:    Glucose, Bld 154 (*)    Total Bilirubin 0.2 (*)    GFR calc non Af Amer 62 (*)    GFR calc Af Amer 72 (*)    Anion gap 16 (*)    All other components within normal limits  ETHANOL - Abnormal; Notable for the following:    Alcohol, Ethyl (B) 185 (*)  All other components within normal limits  URINE RAPID DRUG SCREEN (HOSP PERFORMED)     MDM   Patient presents to emergency room with alcohol intoxication and depression. He's had some vague thoughts of suicide but no specific plan.  The patient is medically stable. Consult with psychiatry team  I personally performed the services described in this documentation, which was scribed in my presence.  The recorded information has been reviewed and is accurate.   Dorie Rank, MD 09/29/13 [phone]

## 2013-09-29 NOTE — ED Notes (Addendum)
Per EMS, patient states he has had several losses of family members recently.  Patient told EMS that he is depressed and wants detox from ETOH.  Patient states he has suicidal ideations.

## 2013-09-30 LAB — RAPID URINE DRUG SCREEN, HOSP PERFORMED
Amphetamines: NOT DETECTED
Barbiturates: NOT DETECTED
Benzodiazepines: NOT DETECTED
Cocaine: NOT DETECTED
Opiates: NOT DETECTED
Tetrahydrocannabinol: NOT DETECTED

## 2013-09-30 MED ORDER — AMIODARONE HCL 200 MG PO TABS
400.0000 mg | ORAL_TABLET | Freq: Every day | ORAL | Status: DC
  Administered 2013-09-30: 400 mg via ORAL
  Filled 2013-09-30 (×2): qty 2

## 2013-09-30 NOTE — ED Notes (Signed)
Pt requesting something for cough; EDP made aware.

## 2013-09-30 NOTE — ED Notes (Signed)
Pt requesting a breathing treatment, states he takes them regularly after being treated for pneumonia recently to help ease his breathing.

## 2013-09-30 NOTE — ED Provider Notes (Signed)
No homicidal or suicidal ideation. No psychosis. [facility]  Nat Christen, MD 09/30/13 1233

## 2013-09-30 NOTE — BH Assessment (Signed)
Assessment Note  Chase Herrera is a 63 year old white male that requests alcohol detox and long term treatment.  Patient reports that he has been drinking a pint of vodka for the past 27 years.  Patient reports that within the last year he has been drinking 3-4 beers daily.  Patient denies using any other drugs.  Patients UDS is negative and his BAL is 185.  Patient reports that last use was yesterday when he drank 4 beers.    Patient now denies SI.  Upon admission to [facility] it is documented that the patient endorsed suicidal ideation.  Patient not reports that he is able to contract for safety.   Patient reports being depressed because he is not able to stop drinking.  Patient reports that he has had several losses of family members recently and it is too much to process.  Patient reports that his father died 7 months ago.    Patient denies SI/HI/Psychosis.  Patient denies physical, sexual or emotional abuse.        Axis I: Alcohol Abuse, Severe and Depressive Disorder  Axis II: Deferred Axis III:  Past Medical History  Diagnosis Date  . Atrial flutter     s/p CTI ablation by Dr [name] 10/13  . Alcohol abuse     Quit 08/23/11  . Essential hypertension, benign   . Type 2 diabetes mellitus   . Cardiomyopathy     LVEF 25-30% 8/13, previously normal by dobutamine echo 5/13  . COPD (chronic obstructive pulmonary disease)   . Depression   . Arthritis    Axis IV: economic problems, other psychosocial or environmental problems, problems related to social environment and problems with access to health care services Axis V: 31-40 impairment in reality testing  Past Medical History:  Past Medical History  Diagnosis Date  . Atrial flutter     s/p CTI ablation by Dr [name] 10/13  . Alcohol abuse     Quit 08/23/11  . Essential hypertension, benign   . Type 2 diabetes mellitus   . Cardiomyopathy     LVEF 25-30% 8/13, previously normal by dobutamine echo 5/13  . COPD (chronic obstructive  pulmonary disease)   . Depression   . Arthritis     Past Surgical History  Procedure Laterality Date  . Tonsillectomy    . Appendectomy    . Atrial ablation surgery  11/06/2011    CTI ablation for atrial flutter by Dr [name]  . Cataracts      Family History:  Family History  Problem Relation Age of Onset  . Diabetes Sister   . Hypertension Mother   . Hypertension Father   . Hypertension Sister   . Hypertension Sister   . Hypertension Sister   . Heart attack Father   . Cancer Mother     lung  . Pulmonary fibrosis Sister     Social History:  reports that he has been smoking Cigarettes.  He has a 23.5 pack-year smoking history. He has never used smokeless tobacco. He reports that he drinks alcohol. He reports that he uses illicit drugs.  Additional Social History:     CIWA: CIWA-Ar BP: 175/83 mmHg Pulse Rate: 80 Nausea and Vomiting: no nausea and no vomiting Tactile Disturbances: none Tremor: two Auditory Disturbances: not present Paroxysmal Sweats: no sweat visible Visual Disturbances: not present Anxiety: no anxiety, at ease Headache, Fullness in Head: none present Agitation: normal activity Orientation and Clouding of Sensorium: oriented and can do serial additions  CIWA-Ar Total: 2 COWS:    Allergies:  Allergies  Allergen Reactions  . Codeine Other (See Comments)    Home Medications:  (Not in a hospital admission)  OB/GYN Status:  No LMP for male patient.  General Assessment Data Location of Assessment: [facility] ED Is this a Tele or Face-to-Face Assessment?: Face-to-Face Is this an Initial Assessment or a Re-assessment for this encounter?: Initial Assessment Living Arrangements: Alone Can pt return to current living arrangement?: Yes Admission Status: Voluntary Is patient capable of signing voluntary admission?: Yes Transfer from: Home Referral Source: Self/Family/Friend  Medical Screening Exam ([place]) Medical Exam completed: Yes  [place] Living Arrangements: Alone Name of Psychiatrist: None Reported Name of Therapist: None Reported  Education Status Is patient currently in school?: No Current Grade: NA Highest grade of school patient has completed: 12 Name of school: NA Contact person: NA  Risk to self with the past 6 months Suicidal Ideation: No Suicidal Intent: No Is patient at risk for suicide?: No Suicidal Plan?: No Access to Means: No What has been your use of drugs/alcohol within the last 12 months?: Alcohol Previous Attempts/Gestures: No How many times?: 0 Other Self Harm Risks: None Reported Triggers for Past Attempts: None known Intentional Self Injurious Behavior: None Family Suicide History: No Recent stressful life event(s): Financial Problems (Depressed because he cannot stop drinking ) Persecutory voices/beliefs?: No Depression: Yes Depression Symptoms: Despondent;Fatigue;Loss of interest in usual pleasures;Feeling worthless/self pity;Guilt Substance abuse history and/or treatment for substance abuse?: Yes Suicide prevention information given to non-admitted patients: Not applicable  Risk to Others within the past 6 months Homicidal Ideation: No Thoughts of Harm to Others: No Current Homicidal Intent: No Current Homicidal Plan: No Access to Homicidal Means: No Identified Victim: None Reported History of harm to others?: No Assessment of Violence: None Noted Violent Behavior Description: None Reported Does patient have access to weapons?: No Criminal Charges Pending?: No Does patient have a court date: No  Psychosis Hallucinations: None noted Delusions: None noted  Mental Status Report Appear/Hygiene: Disheveled;In hospital gown Eye Contact: Fair Motor Activity: Restlessness;Freedom of movement Speech: Logical/coherent Level of Consciousness: Alert Mood: Depressed Affect: Anxious Anxiety Level: None Thought Processes: Coherent;Relevant Judgement:  Unimpaired Orientation: Person;Place;Time;Situation Obsessive Compulsive Thoughts/Behaviors: None  Cognitive Functioning Concentration: Decreased Memory: Recent Intact;Remote Intact IQ: Average Insight: Fair Impulse Control: Fair Appetite: Fair Weight Loss: 0 Weight Gain: 0 Sleep: Decreased Total Hours of Sleep: 6 Vegetative Symptoms: Decreased grooming;Staying in bed  ADLScreening [facility] Assessment Services) Patient's cognitive ability adequate to safely complete daily activities?: Yes Patient able to express need for assistance with ADLs?: Yes Independently performs ADLs?: Yes (appropriate for developmental age)  Prior Inpatient Therapy Prior Inpatient Therapy: No Prior Therapy Dates: NA Prior Therapy Facilty/Provider(s): NA Reason for Treatment: NA  Prior Outpatient Therapy Prior Outpatient Therapy: No Prior Therapy Dates: NA Prior Therapy Facilty/Provider(s): NA Reason for Treatment: NA  ADL Screening (condition at time of admission) Patient's cognitive ability adequate to safely complete daily activities?: Yes Patient able to express need for assistance with ADLs?: Yes Independently performs ADLs?: Yes (appropriate for developmental age)                  Additional Information 1:1 In Past 12 Months?: No CIRT Risk: No Elopement Risk: No Does patient have medical clearance?: Yes     Disposition: Pending psych disposition.  Disposition Initial Assessment Completed for this Encounter: Yes Disposition of Patient: Other dispositions Other disposition(s):  ([facility] psych disposition )  On Site Evaluation by:   Reviewed with Physician:    Graciella Freer [place] 09/30/2013 10:42 AM

## 2013-09-30 NOTE — BH Assessment (Signed)
Per Dr. Dwyane Dee patient is psychiatrically stable for discharge and he will follow up with his [facility] provider in [place].  Writer informed the ER MD Dr. Lacinda Axon and the nurse of the patients need for discharge.

## 2013-09-30 NOTE — BH Assessment (Addendum)
Per, Chase Herrera patient came to the [facility] in [facility] yesterday for an intake and his primary care doctor has recommended that that the patient for substance abuse treatment.  However, the patient needs to follow up with the provider at the [facility] so that they can place him in a substance abuse facility.   He needs to be seen by his [facility] primary care provider.  He does not have a primary care provider.  He needs to call the eligibility department at [facility]  [phone] will be able to coordinate the initial appointment.   Then he can call [facility] to be screened for the substance abuse long term program.   Probation officer informed the patient of the [facility] ability to provide long term substance abuse treatment for him and the patient is in agreement with following up with the [place].  Patient continues to deny SI/JI/Psychosis.

## 2013-09-30 NOTE — Discharge Instructions (Signed)
[facility]

## 2013-09-30 NOTE — ED Notes (Signed)
Pt reports SI without plan. States he has had several family members dies recently and it has been too much to process. Also wants help with ETOH detox as "I have been drinking to much".

## 2013-09-30 NOTE — ED Notes (Signed)
Pt finishing meal then will leave ED

## 2013-10-01 DIAGNOSIS — R5383 Other fatigue: Secondary | ICD-10-CM | POA: Diagnosis not present

## 2013-10-01 DIAGNOSIS — J438 Other emphysema: Secondary | ICD-10-CM | POA: Diagnosis not present

## 2013-10-01 DIAGNOSIS — R5381 Other malaise: Secondary | ICD-10-CM | POA: Diagnosis not present

## 2013-10-01 DIAGNOSIS — I4891 Unspecified atrial fibrillation: Secondary | ICD-10-CM | POA: Diagnosis not present

## 2013-10-01 DIAGNOSIS — R0602 Shortness of breath: Secondary | ICD-10-CM | POA: Diagnosis not present

## 2013-10-02 DIAGNOSIS — J9819 Other pulmonary collapse: Secondary | ICD-10-CM | POA: Diagnosis not present

## 2013-10-02 DIAGNOSIS — R079 Chest pain, unspecified: Secondary | ICD-10-CM | POA: Diagnosis not present

## 2013-10-02 DIAGNOSIS — I10 Essential (primary) hypertension: Secondary | ICD-10-CM | POA: Diagnosis not present

## 2013-10-04 DIAGNOSIS — R141 Gas pain: Secondary | ICD-10-CM | POA: Diagnosis not present

## 2013-10-04 DIAGNOSIS — R143 Flatulence: Secondary | ICD-10-CM | POA: Diagnosis not present

## 2013-10-06 DIAGNOSIS — R079 Chest pain, unspecified: Secondary | ICD-10-CM | POA: Diagnosis not present

## 2013-10-24 DIAGNOSIS — F17210 Nicotine dependence, cigarettes, uncomplicated: Secondary | ICD-10-CM | POA: Diagnosis not present

## 2013-10-24 DIAGNOSIS — I4891 Unspecified atrial fibrillation: Secondary | ICD-10-CM | POA: Diagnosis not present

## 2013-10-24 DIAGNOSIS — Z79899 Other long term (current) drug therapy: Secondary | ICD-10-CM | POA: Diagnosis not present

## 2013-10-24 DIAGNOSIS — I10 Essential (primary) hypertension: Secondary | ICD-10-CM | POA: Diagnosis not present

## 2013-10-24 DIAGNOSIS — F17200 Nicotine dependence, unspecified, uncomplicated: Secondary | ICD-10-CM | POA: Diagnosis not present

## 2013-10-24 DIAGNOSIS — R918 Other nonspecific abnormal finding of lung field: Secondary | ICD-10-CM | POA: Diagnosis not present

## 2013-10-24 DIAGNOSIS — R069 Unspecified abnormalities of breathing: Secondary | ICD-10-CM | POA: Diagnosis not present

## 2013-10-24 DIAGNOSIS — J441 Chronic obstructive pulmonary disease with (acute) exacerbation: Secondary | ICD-10-CM | POA: Diagnosis not present

## 2013-10-24 DIAGNOSIS — J449 Chronic obstructive pulmonary disease, unspecified: Secondary | ICD-10-CM | POA: Diagnosis not present

## 2013-10-24 DIAGNOSIS — E119 Type 2 diabetes mellitus without complications: Secondary | ICD-10-CM | POA: Diagnosis not present

## 2013-10-24 DIAGNOSIS — I509 Heart failure, unspecified: Secondary | ICD-10-CM | POA: Diagnosis not present

## 2013-10-24 DIAGNOSIS — R0602 Shortness of breath: Secondary | ICD-10-CM | POA: Diagnosis not present

## 2013-10-30 DIAGNOSIS — J449 Chronic obstructive pulmonary disease, unspecified: Secondary | ICD-10-CM | POA: Diagnosis not present

## 2013-10-30 DIAGNOSIS — I471 Supraventricular tachycardia: Secondary | ICD-10-CM | POA: Diagnosis not present

## 2013-10-30 DIAGNOSIS — R079 Chest pain, unspecified: Secondary | ICD-10-CM | POA: Diagnosis not present

## 2013-10-31 DIAGNOSIS — R079 Chest pain, unspecified: Secondary | ICD-10-CM | POA: Diagnosis not present

## 2013-11-02 DIAGNOSIS — F17200 Nicotine dependence, unspecified, uncomplicated: Secondary | ICD-10-CM | POA: Diagnosis not present

## 2013-11-02 DIAGNOSIS — Z7901 Long term (current) use of anticoagulants: Secondary | ICD-10-CM | POA: Diagnosis not present

## 2013-11-02 DIAGNOSIS — Z886 Allergy status to analgesic agent status: Secondary | ICD-10-CM | POA: Diagnosis not present

## 2013-11-02 DIAGNOSIS — I10 Essential (primary) hypertension: Secondary | ICD-10-CM | POA: Diagnosis not present

## 2013-11-02 DIAGNOSIS — E119 Type 2 diabetes mellitus without complications: Secondary | ICD-10-CM | POA: Diagnosis not present

## 2013-11-02 DIAGNOSIS — Z8249 Family history of ischemic heart disease and other diseases of the circulatory system: Secondary | ICD-10-CM | POA: Diagnosis not present

## 2013-11-02 DIAGNOSIS — R0602 Shortness of breath: Secondary | ICD-10-CM | POA: Diagnosis not present

## 2013-11-02 DIAGNOSIS — I5030 Unspecified diastolic (congestive) heart failure: Secondary | ICD-10-CM | POA: Diagnosis not present

## 2013-11-02 DIAGNOSIS — Z79899 Other long term (current) drug therapy: Secondary | ICD-10-CM | POA: Diagnosis not present

## 2013-11-02 DIAGNOSIS — J441 Chronic obstructive pulmonary disease with (acute) exacerbation: Secondary | ICD-10-CM | POA: Diagnosis not present

## 2013-11-02 DIAGNOSIS — I4891 Unspecified atrial fibrillation: Secondary | ICD-10-CM | POA: Diagnosis not present

## 2013-11-02 DIAGNOSIS — R69 Illness, unspecified: Secondary | ICD-10-CM | POA: Diagnosis not present

## 2013-11-04 DIAGNOSIS — J441 Chronic obstructive pulmonary disease with (acute) exacerbation: Secondary | ICD-10-CM | POA: Diagnosis not present

## 2013-11-04 DIAGNOSIS — E118 Type 2 diabetes mellitus with unspecified complications: Secondary | ICD-10-CM | POA: Diagnosis not present

## 2013-11-04 DIAGNOSIS — I10 Essential (primary) hypertension: Secondary | ICD-10-CM | POA: Diagnosis not present

## 2013-11-04 DIAGNOSIS — F17200 Nicotine dependence, unspecified, uncomplicated: Secondary | ICD-10-CM | POA: Diagnosis not present

## 2013-11-04 DIAGNOSIS — N400 Enlarged prostate without lower urinary tract symptoms: Secondary | ICD-10-CM | POA: Diagnosis not present

## 2013-11-04 DIAGNOSIS — Z7901 Long term (current) use of anticoagulants: Secondary | ICD-10-CM | POA: Diagnosis not present

## 2013-11-04 DIAGNOSIS — I4891 Unspecified atrial fibrillation: Secondary | ICD-10-CM | POA: Diagnosis not present

## 2013-11-04 DIAGNOSIS — Z79899 Other long term (current) drug therapy: Secondary | ICD-10-CM | POA: Diagnosis not present

## 2013-11-04 DIAGNOSIS — R069 Unspecified abnormalities of breathing: Secondary | ICD-10-CM | POA: Diagnosis not present

## 2013-11-13 DIAGNOSIS — I4891 Unspecified atrial fibrillation: Secondary | ICD-10-CM | POA: Diagnosis not present

## 2013-11-13 DIAGNOSIS — F17210 Nicotine dependence, cigarettes, uncomplicated: Secondary | ICD-10-CM | POA: Diagnosis not present

## 2013-11-13 DIAGNOSIS — J449 Chronic obstructive pulmonary disease, unspecified: Secondary | ICD-10-CM | POA: Diagnosis not present

## 2013-11-13 DIAGNOSIS — Z79899 Other long term (current) drug therapy: Secondary | ICD-10-CM | POA: Diagnosis not present

## 2013-11-13 DIAGNOSIS — I5030 Unspecified diastolic (congestive) heart failure: Secondary | ICD-10-CM | POA: Diagnosis not present

## 2013-11-13 DIAGNOSIS — J441 Chronic obstructive pulmonary disease with (acute) exacerbation: Secondary | ICD-10-CM | POA: Diagnosis not present

## 2013-11-13 DIAGNOSIS — R069 Unspecified abnormalities of breathing: Secondary | ICD-10-CM | POA: Diagnosis not present

## 2013-11-13 DIAGNOSIS — I10 Essential (primary) hypertension: Secondary | ICD-10-CM | POA: Diagnosis not present

## 2013-11-13 DIAGNOSIS — F17200 Nicotine dependence, unspecified, uncomplicated: Secondary | ICD-10-CM | POA: Diagnosis not present

## 2013-11-13 DIAGNOSIS — R0602 Shortness of breath: Secondary | ICD-10-CM | POA: Diagnosis not present

## 2013-11-13 DIAGNOSIS — R0689 Other abnormalities of breathing: Secondary | ICD-10-CM | POA: Diagnosis not present

## 2013-11-13 DIAGNOSIS — Z885 Allergy status to narcotic agent status: Secondary | ICD-10-CM | POA: Diagnosis not present

## 2013-11-13 DIAGNOSIS — Z7901 Long term (current) use of anticoagulants: Secondary | ICD-10-CM | POA: Diagnosis not present

## 2013-11-13 DIAGNOSIS — E119 Type 2 diabetes mellitus without complications: Secondary | ICD-10-CM | POA: Diagnosis not present

## 2013-11-16 DIAGNOSIS — R0902 Hypoxemia: Secondary | ICD-10-CM | POA: Diagnosis not present

## 2013-11-16 DIAGNOSIS — J42 Unspecified chronic bronchitis: Secondary | ICD-10-CM | POA: Diagnosis not present

## 2013-11-16 DIAGNOSIS — J449 Chronic obstructive pulmonary disease, unspecified: Secondary | ICD-10-CM | POA: Diagnosis not present

## 2013-11-23 DIAGNOSIS — N400 Enlarged prostate without lower urinary tract symptoms: Secondary | ICD-10-CM | POA: Diagnosis not present

## 2013-11-23 DIAGNOSIS — J984 Other disorders of lung: Secondary | ICD-10-CM | POA: Diagnosis not present

## 2013-11-23 DIAGNOSIS — F17210 Nicotine dependence, cigarettes, uncomplicated: Secondary | ICD-10-CM | POA: Diagnosis not present

## 2013-11-23 DIAGNOSIS — I509 Heart failure, unspecified: Secondary | ICD-10-CM | POA: Diagnosis not present

## 2013-11-23 DIAGNOSIS — Z8249 Family history of ischemic heart disease and other diseases of the circulatory system: Secondary | ICD-10-CM | POA: Diagnosis not present

## 2013-11-23 DIAGNOSIS — Z7901 Long term (current) use of anticoagulants: Secondary | ICD-10-CM | POA: Diagnosis not present

## 2013-11-23 DIAGNOSIS — J449 Chronic obstructive pulmonary disease, unspecified: Secondary | ICD-10-CM | POA: Diagnosis not present

## 2013-11-23 DIAGNOSIS — R000 Tachycardia, unspecified: Secondary | ICD-10-CM | POA: Diagnosis not present

## 2013-11-23 DIAGNOSIS — Z79899 Other long term (current) drug therapy: Secondary | ICD-10-CM | POA: Diagnosis not present

## 2013-11-23 DIAGNOSIS — Z7982 Long term (current) use of aspirin: Secondary | ICD-10-CM | POA: Diagnosis not present

## 2013-11-23 DIAGNOSIS — I10 Essential (primary) hypertension: Secondary | ICD-10-CM | POA: Diagnosis not present

## 2013-11-23 DIAGNOSIS — I519 Heart disease, unspecified: Secondary | ICD-10-CM | POA: Diagnosis not present

## 2013-11-23 DIAGNOSIS — R0602 Shortness of breath: Secondary | ICD-10-CM | POA: Diagnosis not present

## 2013-11-23 DIAGNOSIS — R030 Elevated blood-pressure reading, without diagnosis of hypertension: Secondary | ICD-10-CM | POA: Diagnosis not present

## 2013-11-23 DIAGNOSIS — E119 Type 2 diabetes mellitus without complications: Secondary | ICD-10-CM | POA: Diagnosis not present

## 2013-11-23 DIAGNOSIS — F17200 Nicotine dependence, unspecified, uncomplicated: Secondary | ICD-10-CM | POA: Diagnosis not present

## 2013-12-07 DIAGNOSIS — R069 Unspecified abnormalities of breathing: Secondary | ICD-10-CM | POA: Diagnosis not present

## 2013-12-07 DIAGNOSIS — J449 Chronic obstructive pulmonary disease, unspecified: Secondary | ICD-10-CM | POA: Diagnosis not present

## 2013-12-07 DIAGNOSIS — R0602 Shortness of breath: Secondary | ICD-10-CM | POA: Diagnosis not present

## 2013-12-14 DIAGNOSIS — R079 Chest pain, unspecified: Secondary | ICD-10-CM | POA: Diagnosis not present

## 2013-12-16 DIAGNOSIS — R918 Other nonspecific abnormal finding of lung field: Secondary | ICD-10-CM | POA: Diagnosis not present

## 2013-12-16 DIAGNOSIS — R0602 Shortness of breath: Secondary | ICD-10-CM | POA: Diagnosis not present

## 2013-12-16 DIAGNOSIS — R069 Unspecified abnormalities of breathing: Secondary | ICD-10-CM | POA: Diagnosis not present

## 2013-12-18 DIAGNOSIS — H5201 Hypermetropia, right eye: Secondary | ICD-10-CM | POA: Diagnosis not present

## 2013-12-18 DIAGNOSIS — Z961 Presence of intraocular lens: Secondary | ICD-10-CM | POA: Diagnosis not present

## 2013-12-18 DIAGNOSIS — E119 Type 2 diabetes mellitus without complications: Secondary | ICD-10-CM | POA: Diagnosis not present

## 2013-12-18 DIAGNOSIS — H52223 Regular astigmatism, bilateral: Secondary | ICD-10-CM | POA: Diagnosis not present

## 2013-12-19 DIAGNOSIS — R0602 Shortness of breath: Secondary | ICD-10-CM | POA: Diagnosis not present

## 2013-12-20 DIAGNOSIS — F17200 Nicotine dependence, unspecified, uncomplicated: Secondary | ICD-10-CM | POA: Diagnosis not present

## 2013-12-20 DIAGNOSIS — J441 Chronic obstructive pulmonary disease with (acute) exacerbation: Secondary | ICD-10-CM | POA: Diagnosis not present

## 2013-12-20 DIAGNOSIS — I480 Paroxysmal atrial fibrillation: Secondary | ICD-10-CM | POA: Diagnosis not present

## 2013-12-20 DIAGNOSIS — R000 Tachycardia, unspecified: Secondary | ICD-10-CM | POA: Diagnosis not present

## 2013-12-20 DIAGNOSIS — R0600 Dyspnea, unspecified: Secondary | ICD-10-CM | POA: Diagnosis not present

## 2013-12-20 DIAGNOSIS — E119 Type 2 diabetes mellitus without complications: Secondary | ICD-10-CM | POA: Diagnosis not present

## 2013-12-20 DIAGNOSIS — R0789 Other chest pain: Secondary | ICD-10-CM | POA: Diagnosis not present

## 2013-12-20 DIAGNOSIS — I5042 Chronic combined systolic (congestive) and diastolic (congestive) heart failure: Secondary | ICD-10-CM | POA: Diagnosis not present

## 2013-12-20 DIAGNOSIS — Z6831 Body mass index (BMI) 31.0-31.9, adult: Secondary | ICD-10-CM | POA: Diagnosis not present

## 2013-12-20 DIAGNOSIS — R0602 Shortness of breath: Secondary | ICD-10-CM | POA: Diagnosis not present

## 2013-12-20 DIAGNOSIS — I10 Essential (primary) hypertension: Secondary | ICD-10-CM | POA: Diagnosis not present

## 2013-12-20 DIAGNOSIS — E669 Obesity, unspecified: Secondary | ICD-10-CM | POA: Diagnosis not present

## 2013-12-20 DIAGNOSIS — J449 Chronic obstructive pulmonary disease, unspecified: Secondary | ICD-10-CM | POA: Diagnosis not present

## 2013-12-20 DIAGNOSIS — Z8249 Family history of ischemic heart disease and other diseases of the circulatory system: Secondary | ICD-10-CM | POA: Diagnosis not present

## 2013-12-21 DIAGNOSIS — I480 Paroxysmal atrial fibrillation: Secondary | ICD-10-CM | POA: Diagnosis not present

## 2013-12-22 DIAGNOSIS — J189 Pneumonia, unspecified organism: Secondary | ICD-10-CM | POA: Diagnosis not present

## 2013-12-22 DIAGNOSIS — J441 Chronic obstructive pulmonary disease with (acute) exacerbation: Secondary | ICD-10-CM | POA: Diagnosis not present

## 2013-12-26 DIAGNOSIS — I10 Essential (primary) hypertension: Secondary | ICD-10-CM | POA: Diagnosis not present

## 2013-12-26 DIAGNOSIS — R069 Unspecified abnormalities of breathing: Secondary | ICD-10-CM | POA: Diagnosis not present

## 2013-12-26 DIAGNOSIS — R0600 Dyspnea, unspecified: Secondary | ICD-10-CM | POA: Diagnosis not present

## 2013-12-26 DIAGNOSIS — J441 Chronic obstructive pulmonary disease with (acute) exacerbation: Secondary | ICD-10-CM | POA: Diagnosis not present

## 2013-12-26 DIAGNOSIS — F17200 Nicotine dependence, unspecified, uncomplicated: Secondary | ICD-10-CM | POA: Diagnosis not present

## 2013-12-26 DIAGNOSIS — R0989 Other specified symptoms and signs involving the circulatory and respiratory systems: Secondary | ICD-10-CM | POA: Diagnosis not present

## 2013-12-29 DIAGNOSIS — R0602 Shortness of breath: Secondary | ICD-10-CM | POA: Diagnosis not present

## 2013-12-30 DIAGNOSIS — R0602 Shortness of breath: Secondary | ICD-10-CM | POA: Diagnosis not present

## 2013-12-30 DIAGNOSIS — J441 Chronic obstructive pulmonary disease with (acute) exacerbation: Secondary | ICD-10-CM | POA: Diagnosis not present

## 2013-12-30 DIAGNOSIS — R069 Unspecified abnormalities of breathing: Secondary | ICD-10-CM | POA: Diagnosis not present

## 2013-12-31 ENCOUNTER — Encounter (HOSPITAL_COMMUNITY): Payer: Self-pay | Admitting: Internal Medicine

## 2013-12-31 DIAGNOSIS — I5020 Unspecified systolic (congestive) heart failure: Secondary | ICD-10-CM | POA: Diagnosis not present

## 2014-01-03 DIAGNOSIS — R0602 Shortness of breath: Secondary | ICD-10-CM | POA: Diagnosis not present

## 2014-01-03 DIAGNOSIS — J441 Chronic obstructive pulmonary disease with (acute) exacerbation: Secondary | ICD-10-CM | POA: Diagnosis not present

## 2014-01-03 DIAGNOSIS — R069 Unspecified abnormalities of breathing: Secondary | ICD-10-CM | POA: Diagnosis not present

## 2014-01-03 DIAGNOSIS — J984 Other disorders of lung: Secondary | ICD-10-CM | POA: Diagnosis not present

## 2014-01-03 DIAGNOSIS — J449 Chronic obstructive pulmonary disease, unspecified: Secondary | ICD-10-CM | POA: Diagnosis not present

## 2014-01-03 DIAGNOSIS — F17210 Nicotine dependence, cigarettes, uncomplicated: Secondary | ICD-10-CM | POA: Diagnosis not present

## 2014-01-06 DIAGNOSIS — I950 Idiopathic hypotension: Secondary | ICD-10-CM | POA: Diagnosis not present

## 2014-01-06 DIAGNOSIS — R069 Unspecified abnormalities of breathing: Secondary | ICD-10-CM | POA: Diagnosis not present

## 2014-01-06 DIAGNOSIS — I4891 Unspecified atrial fibrillation: Secondary | ICD-10-CM | POA: Diagnosis not present

## 2014-01-06 DIAGNOSIS — I480 Paroxysmal atrial fibrillation: Secondary | ICD-10-CM | POA: Diagnosis not present

## 2014-01-06 DIAGNOSIS — R0602 Shortness of breath: Secondary | ICD-10-CM | POA: Diagnosis not present

## 2014-01-06 DIAGNOSIS — R0789 Other chest pain: Secondary | ICD-10-CM | POA: Diagnosis not present

## 2014-01-06 DIAGNOSIS — I4901 Ventricular fibrillation: Secondary | ICD-10-CM | POA: Diagnosis not present

## 2014-01-06 DIAGNOSIS — R079 Chest pain, unspecified: Secondary | ICD-10-CM | POA: Diagnosis not present

## 2014-01-07 DIAGNOSIS — I10 Essential (primary) hypertension: Secondary | ICD-10-CM | POA: Diagnosis not present

## 2014-01-07 DIAGNOSIS — R0600 Dyspnea, unspecified: Secondary | ICD-10-CM | POA: Diagnosis not present

## 2014-01-07 DIAGNOSIS — I4891 Unspecified atrial fibrillation: Secondary | ICD-10-CM | POA: Diagnosis not present

## 2014-01-08 DIAGNOSIS — R0600 Dyspnea, unspecified: Secondary | ICD-10-CM | POA: Diagnosis not present

## 2014-01-09 DIAGNOSIS — R069 Unspecified abnormalities of breathing: Secondary | ICD-10-CM | POA: Diagnosis not present

## 2014-01-13 DIAGNOSIS — R69 Illness, unspecified: Secondary | ICD-10-CM | POA: Diagnosis not present

## 2014-01-19 DIAGNOSIS — J40 Bronchitis, not specified as acute or chronic: Secondary | ICD-10-CM | POA: Diagnosis not present

## 2014-01-19 DIAGNOSIS — E662 Morbid (severe) obesity with alveolar hypoventilation: Secondary | ICD-10-CM | POA: Diagnosis not present

## 2014-01-19 DIAGNOSIS — R0602 Shortness of breath: Secondary | ICD-10-CM | POA: Diagnosis not present

## 2014-01-23 DIAGNOSIS — J984 Other disorders of lung: Secondary | ICD-10-CM | POA: Diagnosis not present

## 2014-01-23 DIAGNOSIS — R0789 Other chest pain: Secondary | ICD-10-CM | POA: Diagnosis not present

## 2014-01-23 DIAGNOSIS — R0602 Shortness of breath: Secondary | ICD-10-CM | POA: Diagnosis not present

## 2014-01-23 DIAGNOSIS — S0990XA Unspecified injury of head, initial encounter: Secondary | ICD-10-CM | POA: Diagnosis not present

## 2014-01-23 DIAGNOSIS — J439 Emphysema, unspecified: Secondary | ICD-10-CM | POA: Diagnosis not present

## 2014-01-27 DIAGNOSIS — R05 Cough: Secondary | ICD-10-CM | POA: Diagnosis not present

## 2014-01-27 DIAGNOSIS — R069 Unspecified abnormalities of breathing: Secondary | ICD-10-CM | POA: Diagnosis not present

## 2014-01-27 DIAGNOSIS — R0602 Shortness of breath: Secondary | ICD-10-CM | POA: Diagnosis not present

## 2014-01-27 DIAGNOSIS — J439 Emphysema, unspecified: Secondary | ICD-10-CM | POA: Diagnosis not present

## 2014-01-27 DIAGNOSIS — R079 Chest pain, unspecified: Secondary | ICD-10-CM | POA: Diagnosis not present

## 2014-01-29 ENCOUNTER — Emergency Department (HOSPITAL_COMMUNITY)
Admission: EM | Admit: 2014-01-29 | Discharge: 2014-01-31 | Disposition: A | Discharge status: Home / Self Care | Payer: Medicare Other | Attending: Emergency Medicine | Admitting: Emergency Medicine

## 2014-01-29 ENCOUNTER — Encounter (HOSPITAL_COMMUNITY): Payer: Self-pay | Admitting: Emergency Medicine

## 2014-01-29 DIAGNOSIS — J449 Chronic obstructive pulmonary disease, unspecified: Secondary | ICD-10-CM | POA: Insufficient documentation

## 2014-01-29 DIAGNOSIS — F1010 Alcohol abuse, uncomplicated: Secondary | ICD-10-CM

## 2014-01-29 DIAGNOSIS — Z792 Long term (current) use of antibiotics: Secondary | ICD-10-CM | POA: Diagnosis not present

## 2014-01-29 DIAGNOSIS — Z8739 Personal history of other diseases of the musculoskeletal system and connective tissue: Secondary | ICD-10-CM | POA: Insufficient documentation

## 2014-01-29 DIAGNOSIS — Z7952 Long term (current) use of systemic steroids: Secondary | ICD-10-CM | POA: Diagnosis not present

## 2014-01-29 DIAGNOSIS — Z79899 Other long term (current) drug therapy: Secondary | ICD-10-CM | POA: Insufficient documentation

## 2014-01-29 DIAGNOSIS — I10 Essential (primary) hypertension: Secondary | ICD-10-CM | POA: Insufficient documentation

## 2014-01-29 DIAGNOSIS — Z7901 Long term (current) use of anticoagulants: Secondary | ICD-10-CM | POA: Insufficient documentation

## 2014-01-29 DIAGNOSIS — E119 Type 2 diabetes mellitus without complications: Secondary | ICD-10-CM | POA: Diagnosis not present

## 2014-01-29 DIAGNOSIS — Z008 Encounter for other general examination: Secondary | ICD-10-CM | POA: Diagnosis present

## 2014-01-29 DIAGNOSIS — F329 Major depressive disorder, single episode, unspecified: Secondary | ICD-10-CM | POA: Insufficient documentation

## 2014-01-29 LAB — BASIC METABOLIC PANEL
Anion gap: 7 (ref 5–15)
BUN: 27 mg/dL — ABNORMAL HIGH (ref 6–23)
CO2: 31 mmol/L (ref 19–32)
Calcium: 8.8 mg/dL (ref 8.4–10.5)
Chloride: 101 mEq/L (ref 96–112)
Creatinine, Ser: 1.35 mg/dL (ref 0.50–1.35)
GFR calc Af Amer: 64 mL/min — ABNORMAL LOW (ref >90)
GFR calc non Af Amer: 55 mL/min — ABNORMAL LOW (ref >90)
Glucose, Bld: 193 mg/dL — ABNORMAL HIGH (ref 70–99)
Potassium: 3.3 mmol/L — ABNORMAL LOW (ref 3.5–5.1)
Sodium: 139 mmol/L (ref 135–145)

## 2014-01-29 LAB — CBC WITH DIFFERENTIAL/PLATELET
Basophils Absolute: 0 10*3/uL (ref 0.0–0.1)
Basophils Relative: 0 % (ref 0–1)
Eosinophils Absolute: 0.1 10*3/uL (ref 0.0–0.7)
Eosinophils Relative: 1 % (ref 0–5)
HCT: 37.7 % — ABNORMAL LOW (ref 39.0–52.0)
Hemoglobin: 12.6 g/dL — ABNORMAL LOW (ref 13.0–17.0)
Lymphocytes Relative: 28 % (ref 12–46)
Lymphs Abs: 1.6 10*3/uL (ref 0.7–4.0)
MCH: 34.7 pg — ABNORMAL HIGH (ref 26.0–34.0)
MCHC: 33.4 g/dL (ref 30.0–36.0)
MCV: 103.9 fL — ABNORMAL HIGH (ref 78.0–100.0)
Monocytes Absolute: 0.7 10*3/uL (ref 0.1–1.0)
Monocytes Relative: 12 % (ref 3–12)
Neutro Abs: 3.3 10*3/uL (ref 1.7–7.7)
Neutrophils Relative %: 59 % (ref 43–77)
Platelets: 160 10*3/uL (ref 150–400)
RBC: 3.63 MIL/uL — ABNORMAL LOW (ref 4.22–5.81)
RDW: 13 % (ref 11.5–15.5)
WBC: 5.7 10*3/uL (ref 4.0–10.5)

## 2014-01-29 LAB — URINALYSIS, ROUTINE W REFLEX MICROSCOPIC
Bilirubin Urine: NEGATIVE
Glucose, UA: 250 mg/dL — AB
Hgb urine dipstick: NEGATIVE
Leukocytes, UA: NEGATIVE
Nitrite: NEGATIVE
Protein, ur: NEGATIVE mg/dL
Specific Gravity, Urine: 1.02 (ref 1.005–1.030)
Urobilinogen, UA: 0.2 mg/dL (ref 0.0–1.0)
pH: 6.5 (ref 5.0–8.0)

## 2014-01-29 LAB — RAPID URINE DRUG SCREEN, HOSP PERFORMED
Amphetamines: NOT DETECTED
Barbiturates: NOT DETECTED
Benzodiazepines: NOT DETECTED
Cocaine: NOT DETECTED
Opiates: NOT DETECTED
Tetrahydrocannabinol: NOT DETECTED

## 2014-01-29 LAB — ETHANOL: Alcohol, Ethyl (B): 5 mg/dL (ref 0–9)

## 2014-01-29 MED ORDER — LORAZEPAM 1 MG PO TABS
1.0000 mg | ORAL_TABLET | Freq: Three times a day (TID) | ORAL | Status: DC
  Administered 2014-01-29 – 2014-01-31 (×5): 1 mg via ORAL
  Filled 2014-01-29 (×5): qty 1

## 2014-01-29 MED ORDER — RIVAROXABAN 15 MG PO TABS
15.0000 mg | ORAL_TABLET | Freq: Every day | ORAL | Status: DC
  Administered 2014-01-30 (×2): 15 mg via ORAL
  Filled 2014-01-29 (×2): qty 1

## 2014-01-29 MED ORDER — CITALOPRAM HYDROBROMIDE 20 MG PO TABS
20.0000 mg | ORAL_TABLET | Freq: Every evening | ORAL | Status: DC
  Administered 2014-01-29 – 2014-01-30 (×2): 20 mg via ORAL
  Filled 2014-01-29 (×3): qty 1

## 2014-01-29 MED ORDER — METFORMIN HCL 500 MG PO TABS
500.0000 mg | ORAL_TABLET | Freq: Every day | ORAL | Status: DC
  Administered 2014-01-30 – 2014-01-31 (×2): 500 mg via ORAL
  Filled 2014-01-29 (×2): qty 1

## 2014-01-29 MED ORDER — SULFAMETHOXAZOLE-TRIMETHOPRIM 800-160 MG PO TABS
1.0000 | ORAL_TABLET | Freq: Two times a day (BID) | ORAL | Status: AC
  Administered 2014-01-29 – 2014-01-31 (×4): 1 via ORAL
  Filled 2014-01-29 (×4): qty 1

## 2014-01-29 MED ORDER — ALBUTEROL SULFATE HFA 108 (90 BASE) MCG/ACT IN AERS: 2.0000 | INHALATION_SPRAY | Freq: Four times a day (QID) | RESPIRATORY_TRACT | Status: DC | PRN

## 2014-01-29 MED ORDER — CITALOPRAM HYDROBROMIDE 20 MG PO TABS
ORAL_TABLET | ORAL | Status: AC
  Filled 2014-01-29: qty 1

## 2014-01-29 MED ORDER — FUROSEMIDE 40 MG PO TABS
40.0000 mg | ORAL_TABLET | Freq: Every day | ORAL | Status: DC
  Administered 2014-01-30 – 2014-01-31 (×2): 40 mg via ORAL
  Filled 2014-01-29 (×2): qty 1

## 2014-01-29 MED ORDER — LORAZEPAM 1 MG PO TABS: 0.0000 mg | ORAL_TABLET | Freq: Two times a day (BID) | ORAL | Status: DC

## 2014-01-29 MED ORDER — LORAZEPAM 1 MG PO TABS: 0.0000 mg | ORAL_TABLET | Freq: Four times a day (QID) | ORAL | Status: DC

## 2014-01-29 MED ORDER — CARVEDILOL 12.5 MG PO TABS
12.5000 mg | ORAL_TABLET | Freq: Two times a day (BID) | ORAL | Status: DC
  Administered 2014-01-30 – 2014-01-31 (×3): 12.5 mg via ORAL
  Filled 2014-01-29 (×5): qty 1

## 2014-01-29 MED ORDER — FLUTICASONE PROPIONATE HFA 44 MCG/ACT IN AERO
INHALATION_SPRAY | RESPIRATORY_TRACT | Status: AC
  Filled 2014-01-29: qty 10.6

## 2014-01-29 MED ORDER — ADULT MULTIVITAMIN W/MINERALS CH
1.0000 | ORAL_TABLET | Freq: Every day | ORAL | Status: DC
  Administered 2014-01-30 – 2014-01-31 (×2): 1 via ORAL
  Filled 2014-01-29 (×2): qty 1

## 2014-01-29 MED ORDER — NABUMETONE 500 MG PO TABS
ORAL_TABLET | ORAL | Status: AC
  Filled 2014-01-29: qty 1

## 2014-01-29 MED ORDER — AMIODARONE HCL 200 MG PO TABS
400.0000 mg | ORAL_TABLET | Freq: Every day | ORAL | Status: DC
  Administered 2014-01-30 – 2014-01-31 (×2): 400 mg via ORAL
  Filled 2014-01-29 (×3): qty 2

## 2014-01-29 MED ORDER — NABUMETONE 500 MG PO TABS
500.0000 mg | ORAL_TABLET | Freq: Two times a day (BID) | ORAL | Status: DC
  Administered 2014-01-29 – 2014-01-31 (×4): 500 mg via ORAL
  Filled 2014-01-29 (×6): qty 1

## 2014-01-29 MED ORDER — VITAMIN B-1 100 MG PO TABS
100.0000 mg | ORAL_TABLET | Freq: Every day | ORAL | Status: DC
  Administered 2014-01-30 – 2014-01-31 (×2): 100 mg via ORAL
  Filled 2014-01-29 (×2): qty 1

## 2014-01-29 MED ORDER — IPRATROPIUM-ALBUTEROL 0.5-2.5 (3) MG/3ML IN SOLN
3.0000 mL | Freq: Four times a day (QID) | RESPIRATORY_TRACT | Status: DC | PRN
  Administered 2014-01-30 (×4): 3 mL via RESPIRATORY_TRACT
  Filled 2014-01-29 (×4): qty 3

## 2014-01-29 MED ORDER — TAMSULOSIN HCL 0.4 MG PO CAPS
0.4000 mg | ORAL_CAPSULE | Freq: Every day | ORAL | Status: DC
  Administered 2014-01-30 – 2014-01-31 (×2): 0.4 mg via ORAL
  Filled 2014-01-29 (×2): qty 1

## 2014-01-29 MED ORDER — THIAMINE HCL 100 MG/ML IJ SOLN: 100.0000 mg | Freq: Every day | INTRAMUSCULAR | Status: DC

## 2014-01-29 MED ORDER — ASPIRIN EC 81 MG PO TBEC
81.0000 mg | DELAYED_RELEASE_TABLET | Freq: Every day | ORAL | Status: DC
  Administered 2014-01-30 – 2014-01-31 (×2): 81 mg via ORAL
  Filled 2014-01-29 (×2): qty 1

## 2014-01-29 MED ORDER — IPRATROPIUM-ALBUTEROL 0.5-2.5 (3) MG/3ML IN SOLN
3.0000 mL | Freq: Once | RESPIRATORY_TRACT | Status: AC
  Administered 2014-01-29: 3 mL via RESPIRATORY_TRACT
  Filled 2014-01-29: qty 3

## 2014-01-29 MED ORDER — FLUTICASONE PROPIONATE HFA 44 MCG/ACT IN AERO
1.0000 | INHALATION_SPRAY | Freq: Two times a day (BID) | RESPIRATORY_TRACT | Status: DC
  Administered 2014-01-29 – 2014-01-31 (×4): 1 via RESPIRATORY_TRACT
  Filled 2014-01-29: qty 10.6

## 2014-01-29 MED ORDER — VITAMIN D3 25 MCG (1000 UNIT) PO TABS
1000.0000 [IU] | ORAL_TABLET | Freq: Every day | ORAL | Status: DC
  Administered 2014-01-30 – 2014-01-31 (×2): 1000 [IU] via ORAL
  Filled 2014-01-29 (×3): qty 1

## 2014-01-29 NOTE — ED Notes (Signed)
Pt reports wants help off of alcohol. Pt reports drinks "a couple 24oz of beers a day." pt denies any si/hi/delusions.

## 2014-01-29 NOTE — ED Notes (Signed)
Family member called checking for update on pt status. Advised no change pt still in ER waiting.

## 2014-01-29 NOTE — ED Provider Notes (Signed)
CSN: [id number]     Arrival date & time 01/29/14  1703 History  This chart was scribed for Virgel Manifold, MD by Chester Holstein, ED Scribe. This patient was seen in room APA03/APA03 and the patient's care was started at 5:14 PM.    Chief Complaint  Patient presents with  . Medical Clearance    The history is provided by the patient and a relative. No language interpreter was used.   HPI Comments: Chase Herrera is a 64 y.o. male with PMHx of DM, COPD, and CHF who presents to the Emergency Department complaining of EtOH abuse.  Pt notes he has had a lot of family stressors in the past few weeks.  He states he just wants help quitting.  Pt's niece and sister are in exam room with him. Pt has been seen in other ED's frequently in recent weeks, most recently on 01/27/14, for SOB and chest pain. Pt's niece notes at that time he had been taking Ativan and using EtOH.  Niece states she spoke with [facility] and they suggested he be brought to a [facility] ED.  Pt last had a drink this morning.  He notes he's been drinking since he was 20.  Pt notes he was sober for 3 years ending a couple years ago. He has done a detox in the past, prior to onset of chronic medical problems.  Niece notes he is prone to accidental self-injury. Pt notes he does not have withdrawal symptoms including seizures or hallucinations.  Pt denies drug use and suicidal and homicidal ideation.  Pt is on 2 L of oxygen at home. Pt is a smoker. Pt states he has some nasal congestion. Pt denies difficulty breathing today.    Past Medical History  Diagnosis Date  . Atrial flutter     s/p CTI ablation by Dr [name] 10/13  . Alcohol abuse     Quit 08/23/11  . Essential hypertension, benign   . Type 2 diabetes mellitus   . Cardiomyopathy     LVEF 25-30% 8/13, previously normal by dobutamine echo 5/13  . COPD (chronic obstructive pulmonary disease)   . Depression   . Arthritis    Past Surgical History  Procedure Laterality Date  .  Tonsillectomy    . Appendectomy    . Atrial ablation surgery  11/06/2011    CTI ablation for atrial flutter by Dr [name]  . Cataracts    . Atrial flutter ablation N/A 11/06/2011    Procedure: ATRIAL FLUTTER ABLATION;  Surgeon: Thompson Grayer, MD;  [facility] C[facility] LAB;  Service: Cardiovascular;  Laterality: N/A;   Family History  Problem Relation Age of Onset  . Diabetes Sister   . Hypertension Mother   . Hypertension Father   . Hypertension Sister   . Hypertension Sister   . Hypertension Sister   . Heart attack Father   . Cancer Mother     lung  . Pulmonary fibrosis Sister    History  Substance Use Topics  . Smoking status: Current Every Day Smoker -- 0.50 packs/day for 47 years    Types: Cigarettes  . Smokeless tobacco: Never Used     Comment: encouraged to quit today 03/04/12  . Alcohol Use: Yes     Comment: current very heavy ETOH, quit 08/23/11    Review of Systems  HENT: Positive for congestion.   Respiratory: Negative for shortness of breath.   Psychiatric/Behavioral: Negative for suicidal ideas.  All other systems reviewed and are  negative.     Allergies  Codeine  Home Medications   Prior to Admission medications   Medication Sig Start Date End Date Taking? Authorizing Provider  albuterol (PROVENTIL HFA;VENTOLIN HFA) 108 (90 BASE) MCG/ACT inhaler Inhale 2 puffs into the lungs every 6 (six) hours as needed. For shortness of breath    Historical Provider, MD  amiodarone (PACERONE) 200 MG tablet Take 400 mg by mouth daily.    Historical Provider, MD  amiodarone (PACERONE) 400 MG tablet Take 400 mg by mouth daily.    Historical Provider, MD  amoxicillin-clavulanate (AUGMENTIN) 875-125 MG per tablet Take 1 tablet by mouth 2 (two) times daily.    Historical Provider, MD  azithromycin (ZITHROMAX) 500 MG tablet Take 500 mg by mouth daily.    Historical Provider, MD  beclomethasone (QVAR) 80 MCG/ACT inhaler Inhale 1 puff into the lungs 2 (two) times daily.    Historical  Provider, MD  carvedilol (COREG) 12.5 MG tablet Take 12.5 mg by mouth 2 (two) times daily with a meal.     Historical Provider, MD  citalopram (CELEXA) 20 MG tablet Take 20 mg by mouth every evening.     Historical Provider, MD  furosemide (LASIX) 40 MG tablet Take 40 mg by mouth daily.    Historical Provider, MD  ipratropium-albuterol (DUONEB) 0.5-2.5 (3) MG/3ML SOLN Take 3 mLs by nebulization every 6 (six) hours as needed (shortness of breath/wheezing).    Historical Provider, MD  metFORMIN (GLUCOPHAGE) 500 MG tablet Take 500-1,000 mg by mouth 2 (two) times daily. Takes one tablet in the morning and two tablets at bedtime    Historical Provider, MD  nabumetone (RELAFEN) 500 MG tablet Take 500 mg by mouth 2 (two) times daily.    Historical Provider, MD  potassium chloride SA (K-DUR,KLOR-CON) 20 MEQ tablet Take 20 mEq by mouth daily.    Historical Provider, MD  Rivaroxaban (XARELTO) 15 MG TABS tablet Take 15 mg by mouth daily.    Historical Provider, MD  tamsulosin (FLOMAX) 0.4 MG CAPS capsule Take 0.4 mg by mouth daily. 08/25/13   Historical Provider, MD   BP 151/61 mmHg  Pulse 85  Temp(Src) 98.2 F (36.8 C) (Temporal)  Resp 18  Ht 6\' 1"  (1.854 m)  Wt 240 lb (108.863 kg)  BMI 31.67 kg/m2  SpO2 93% Physical Exam  Constitutional: He appears well-developed and well-nourished. No distress.  Disheveled appearance.   HENT:  Head: Normocephalic and atraumatic.  Eyes: Conjunctivae are normal. Right eye exhibits no discharge. Left eye exhibits no discharge.  Neck: Neck supple.  Cardiovascular: Normal rate, regular rhythm and normal heart sounds.  Exam reveals no gallop and no friction rub.   No murmur heard. Pulmonary/Chest: Effort normal and breath sounds normal. No respiratory distress.  Abdominal: Soft. He exhibits no distension. There is no tenderness.  Musculoskeletal: He exhibits no edema or tenderness.  Neurological: He is alert.  Skin: Skin is warm and dry.  Psychiatric: He has a  normal mood and affect. His behavior is normal. Thought content normal.  Nursing note and vitals reviewed.   ED Course  Procedures (including critical care time) DIAGNOSTIC STUDIES: Oxygen Saturation is 93% on oxygen, adequate by my interpretation.    COORDINATION OF CARE: 5:33 PM Discussed treatment plan with patient at beside, the patient agrees with the plan and has no further questions at this time.   Labs Review Labs Reviewed - No data to display  Imaging Review No results found.   EKG Interpretation None  MDM   Final diagnoses:  Alcohol abuse    I personally preformed the services scribed in my presence. The recorded information has been reviewed is accurate. Virgel Manifold, MD.     Virgel Manifold, MD 02/08/14 [phone]

## 2014-01-29 NOTE — ED Notes (Signed)
Pt given peanut butter, crackers & water as a snack.

## 2014-01-29 NOTE — BH Assessment (Addendum)
Tele Assessment Note   Chase Herrera is an 64 y.o. white male.  Patient requests alcohol detox.  Pt reports that he drinks "a couple 24oz of beers a day." Patient reports that he has been addicted to alcohol all of his life.  Patient denies using any drugs.  Patient denies any withdrawal symptoms.  Patient reports that his drink was today when he drank (2 - 24oz beers).  Patient denies a history of seizures.    Patient reports detox at [facility] in the past.  Patient reports that that he also receives medication management with Dr. Marchia Meiers.  Patient reports that he is not compliant with taking his psychotropic medication because he is not able to drink while he is taking his medication for anxiety and depression.  Patient denies SI/HI/Psychosis.   Patient denies physical, sexual and emotional abuse.   Patient reports that he has supports at home because his niece and her daughter lives with him.       Axis I: Alcohol Abuse and  Depressive Disorder  Axis II: Deferred Axis III:  Past Medical History  Diagnosis Date  . Atrial flutter     s/p CTI ablation by Dr [name] 10/13  . Alcohol abuse     Quit 08/23/11  . Essential hypertension, benign   . Type 2 diabetes mellitus   . Cardiomyopathy     LVEF 25-30% 8/13, previously normal by dobutamine echo 5/13  . COPD (chronic obstructive pulmonary disease)   . Depression   . Arthritis    Axis IV: other psychosocial or environmental problems, problems related to social environment and problems with access to health care services Axis V: 31-40 impairment in reality testing  Past Medical History:  Past Medical History  Diagnosis Date  . Atrial flutter     s/p CTI ablation by Dr [name] 10/13  . Alcohol abuse     Quit 08/23/11  . Essential hypertension, benign   . Type 2 diabetes mellitus   . Cardiomyopathy     LVEF 25-30% 8/13, previously normal by dobutamine echo 5/13  . COPD (chronic obstructive pulmonary disease)   . Depression   .  Arthritis     Past Surgical History  Procedure Laterality Date  . Tonsillectomy    . Appendectomy    . Atrial ablation surgery  11/06/2011    CTI ablation for atrial flutter by Dr [name]  . Cataracts    . Atrial flutter ablation N/A 11/06/2011    Procedure: ATRIAL FLUTTER ABLATION;  Surgeon: Thompson Grayer, MD;  Location: [facility] CATH LAB;  Service: Cardiovascular;  Laterality: N/A;    Family History:  Family History  Problem Relation Age of Onset  . Diabetes Sister   . Hypertension Mother   . Hypertension Father   . Hypertension Sister   . Hypertension Sister   . Hypertension Sister   . Heart attack Father   . Cancer Mother     lung  . Pulmonary fibrosis Sister     Social History:  reports that he has been smoking Cigarettes.  He has a 23.5 pack-year smoking history. He has never used smokeless tobacco. He reports that he drinks alcohol. He reports that he uses illicit drugs.  Additional Social History:     CIWA: CIWA-Ar BP: 151/61 mmHg Pulse Rate: 85 COWS:    PATIENT STRENGTHS: (choose at least two) Ability for insight Average or above average intelligence Capable of independent living Communication skills Financial means Supportive family/friends  Allergies:  Allergies  Allergen Reactions  . Codeine Other (See Comments)    Home Medications:  (Not in a hospital admission)  OB/GYN Status:  No LMP for male patient.  General Assessment Data Location of Assessment: [facility] Services Is this a Tele or Face-to-Face Assessment?: Tele Assessment Is this an Initial Assessment or a Re-assessment for this encounter?: Initial Assessment Living Arrangements: Alone Can pt return to current living arrangement?: Yes Admission Status: Voluntary Is patient capable of signing voluntary admission?: Yes Transfer from: Home Referral Source: Self/Family/Friend  Medical Screening Exam ([place]) Medical Exam completed: Yes  [facility] Crisis Care Plan Living  Arrangements: Alone Name of Psychiatrist: Dr. Marchia Meiers  Name of Therapist: None Reported  Education Status Is patient currently in school?: No Current Grade: NA Highest grade of school patient has completed: NA Name of school: NA Contact person: NA  Risk to self with the past 6 months Suicidal Ideation: No Suicidal Intent: No Is patient at risk for suicide?: No Suicidal Plan?: No Access to Means: No What has been your use of drugs/alcohol within the last 12 months?: Alcohol Previous Attempts/Gestures: No How many times?: 0 Other Self Harm Risks: NA Triggers for Past Attempts:  (nNA) Intentional Self Injurious Behavior: None Family Suicide History: No Recent stressful life event(s): Other (Comment) (Unable to stop drinking ) Persecutory voices/beliefs?: No Depression: Yes Depression Symptoms: Insomnia, Fatigue, Guilt, Loss of interest in usual pleasures, Feeling worthless/self pity Substance abuse history and/or treatment for substance abuse?: Yes Suicide prevention information given to non-admitted patients: Not applicable  Risk to Others within the past 6 months Homicidal Ideation: No Thoughts of Harm to Others: No Current Homicidal Intent: No Current Homicidal Plan: No Access to Homicidal Means: No Identified Victim: None Reported History of harm to others?: No Assessment of Violence: None Noted Violent Behavior Description: None Reported Does patient have access to weapons?: Yes (Comment) Criminal Charges Pending?: No Does patient have a court date: No  Psychosis Hallucinations: None noted Delusions: None noted  Mental Status Report Appear/Hygiene: Disheveled, In hospital gown Eye Contact: Fair Motor Activity: Freedom of movement Speech: Logical/coherent Level of Consciousness: Restless Mood: Depressed Affect: Appropriate to circumstance Anxiety Level: Minimal Thought Processes: Relevant, Coherent Judgement: Unimpaired Orientation: Place, Person, Time,  Situation Obsessive Compulsive Thoughts/Behaviors: None  Cognitive Functioning Concentration: Decreased Memory: Recent Intact, Remote Intact IQ: Average Insight: Fair Impulse Control: Poor Appetite: Fair Weight Loss: 0 Weight Gain: 0 Sleep: Decreased Total Hours of Sleep: 6 Vegetative Symptoms: Decreased grooming, Staying in bed  ADLScreening [facility] Assessment Services) Patient's cognitive ability adequate to safely complete daily activities?: Yes Patient able to express need for assistance with ADLs?: Yes Independently performs ADLs?: Yes (appropriate for developmental age)  Prior Inpatient Therapy Prior Inpatient Therapy: Yes Prior Therapy Dates: 2013 Prior Therapy Facilty/Provider(s): [facility] Reason for Treatment: Detox  Prior Outpatient Therapy Prior Outpatient Therapy: Yes Prior Therapy Dates: Ongoing  Prior Therapy Facilty/Provider(s): Dr. Marchia Meiers  Reason for Treatment: Medication Management  ADL Screening (condition at time of admission) Patient's cognitive ability adequate to safely complete daily activities?: Yes Patient able to express need for assistance with ADLs?: Yes Independently performs ADLs?: Yes (appropriate for developmental age)             Advance Directives (For Healthcare) Does patient have an advance directive?: No Would patient like information on creating an advanced directive?: No - patient declined information    Additional Information 1:1 In Past 12 Months?: No CIRT Risk: No Elopement Risk: No Does patient have medical  clearance?: Yes     Disposition: Per Heloise Purpura, patient meets criteria for inpatient hospitalization.   Disposition Initial Assessment Completed for this Encounter: Yes Disposition of Patient: Inpatient treatment program Type of inpatient treatment program: Adult  Rene Paci 01/29/2014 6:09 PM

## 2014-01-29 NOTE — BH Assessment (Signed)
Per Heloise Purpura, NP - patient meets criteria for inpatient hospitalization. Per Heloise Purpura patient is not able to come to [facility] due to not being dually diagnosed.

## 2014-01-29 NOTE — ED Notes (Signed)
MD at bedside. 

## 2014-01-29 NOTE — ED Notes (Signed)
Pt states he went to detox about 3 yeas ago. States need to make sure "i go somewhere they can take care of me & my health issues." dinner tray removed from room. No needs voiced at this time.

## 2014-01-30 MED ORDER — ALBUTEROL SULFATE HFA 108 (90 BASE) MCG/ACT IN AERS
2.0000 | INHALATION_SPRAY | RESPIRATORY_TRACT | Status: DC | PRN
  Administered 2014-01-31 (×2): 2 via RESPIRATORY_TRACT
  Filled 2014-01-30: qty 6.7

## 2014-01-30 MED ORDER — IPRATROPIUM-ALBUTEROL 0.5-2.5 (3) MG/3ML IN SOLN
3.0000 mL | Freq: Four times a day (QID) | RESPIRATORY_TRACT | Status: DC
  Administered 2014-01-31 (×2): 3 mL via RESPIRATORY_TRACT
  Filled 2014-01-30 (×2): qty 3

## 2014-01-30 NOTE — ED Notes (Signed)
Pt resting calmly w/ eyes closed. Rise & fall of the chest noted. Bed in low position, side rails up x2. NAD noted at this time.  

## 2014-01-30 NOTE — BH Assessment (Signed)
Lavell Luster, [facility] at [facility], confirms adult unit is currently at capacity. Contacted the following following facilities for placement:  BED AVAILABLE, FAXED CLINICAL INFORMATION: [facility], per [facility], per Tammi Klippel  AT CAPACITY: [facility], per [facility], per [facility], per [facility], per [facility], per [facility], per [facility], per [facility], per [employer], per [facility], per [employer], per [facility], per [facility], per [facility], per [place]  NO RESPONSE: [facility]   [address] [place], [place], [facility] Triage Specialist [phone]

## 2014-01-30 NOTE — ED Notes (Signed)
Pt requested duoneb tx; RT paged. According to [facility] it is too soon for another tx however RT will come assess pt.

## 2014-01-30 NOTE — ED Notes (Signed)
Repeat tele psych being done at this time.

## 2014-01-30 NOTE — Progress Notes (Signed)
CSW continued placement efforts for patient:  Referrals sent for consideration: [place] [facility]  Denied: [facility] only [place] due to [employer] at Capacity: [facility] no beds today reported to try again tomorrow.   Patient needs continued placement efforts.  Hillery Hunter, LCSW Disposition Social Worker [phone]

## 2014-01-30 NOTE — ED Notes (Signed)
Patient given decaf coffee at this time.

## 2014-01-30 NOTE — BH Assessment (Addendum)
Reviewed ED notes and requested cart be placed with pt for reassessment.    First two attempts did not connect will try again at 2020.  2022 Continue to be unable to connect to cart. Informed ED staff that cart was not connecting. Marliss Coots will check on it. Continued to try to connect several more times without success.    Lear Ng, [facility] Triage Specialist 01/30/2014 8:10 PM

## 2014-01-30 NOTE — ED Notes (Signed)
Pt sleeping. Regular rise and fall of chest noted.

## 2014-01-30 NOTE — BH Assessment (Signed)
Lavell Luster, [facility] at [facility], confirms adult unit is currently at capacity. Contacted the following facilities for placement:  BED AVAILABLE, FAXED CLINICAL INFORMATION: [facility], per [facility], per [facility]  AT CAPACITY: [facility], per [facility], per [facility], per [facility], per [facility], per [facility], per [facility], per [facility], per [facility], per [facility], per [facility], per [facility], per [facility], per [facility], per [place]  PT DECLINED: [facility] [address] [place], [place], [facility] Triage Specialist [phone]

## 2014-01-30 NOTE — BH Assessment (Signed)
Reassessment Pt is alert and oriented times 4. He reports no current withdrawal sx except shaking in his finger tips. Pt denies SI/HI. Pt reports he is unsure is he has ever had a seizure before when attempting to quit drinking. He reports he is concerned that rehab facilities may not be able to help care for his medical needs, reporting he has severe heart problems and severe COPD. Pt reports his after his last stay in rehab he was immediately admitted to the hospital with congestive heart failure. He would like to be referred to Hospital based programs. '  Presentation remains the same pt. Continues to meet inpt criteria. TTS will continue to seek placement.   Lear Ng, [facility] Triage Specialist 01/30/2014 8:35 PM

## 2014-01-31 LAB — CBG MONITORING, ED: Glucose-Capillary: 147 mg/dL — ABNORMAL HIGH (ref 70–99)

## 2014-01-31 NOTE — ED Notes (Signed)
Pt states he does not want to go home for out patient treamtent.  Sister on the phone to let staff know she is unhappy with pt being released.  Called and spoke with Altha Harm at [facility] and Estill Bamberg is to call back and speak with pt.

## 2014-01-31 NOTE — ED Notes (Signed)
[facility] Pt notified.

## 2014-01-31 NOTE — ED Notes (Signed)
Patient ambulatory to bathroom.

## 2014-01-31 NOTE — ED Notes (Signed)
Amanda with [facility] spoke with pt on the telephone.  Pt to be discharged and follow up in 2 days with doctor.

## 2014-01-31 NOTE — ED Notes (Signed)
Pt to be released from the hospital.  Pt asking if we tried [facility] hospital in [place] for placement.  Spoke with Estill Bamberg at [facility] and she is contacting the [place] hospital to check.

## 2014-01-31 NOTE — BH Assessment (Signed)
[place] Assessment Progress Note      Spoke to pt's nurse who reports the patient requested a referral to the [facility]  This Probation officer spoke with the RN on the inpatient unit who stated they do not take referrals over the weekend, but that the patient could be referred to the ED if there was concern regarding drug use in the interim.  Spoke with RN in charge of ED who reports the ED is full and they are diverting patients at this time.  He states the patient may self refer for treatment through the ED, but that referral is typically done by the patient's Primary Care Physician through the [facility].  Notified [facility] of this information.

## 2014-01-31 NOTE — [facility] Counselor (Signed)
Spoke to [employer] (sister) and gave numbers to potential facilities that take medicare ([facility] and [facility]). AA meetings were suggested in the interim and instructions on how to connect to services given.   Bedelia Person, M.S., LPCA, [facility] Licensed Professional Counselor Associate  Triage Specialist  [facility]  Therapeutic Triage Services Phone: [phone] Fax: [phone]

## 2014-01-31 NOTE — Discharge Instructions (Signed)
Chemical Dependency °Chemical dependency is an addiction to drugs or alcohol. It is characterized by the repeated behavior of seeking out and using drugs and alcohol despite harmful consequences to the health and safety of ones self and others.  °RISK FACTORS °There are certain situations or behaviors that increase a person's risk for chemical dependency. These include: °· A family history of chemical dependency. °· A history of mental health issues, including depression and anxiety. °· A home environment where drugs and alcohol are easily available to you. °· Drug or alcohol use at a young age. °SYMPTOMS  °The following symptoms can indicate chemical dependency: °· Inability to limit the use of drugs or alcohol. °· Nausea, sweating, shakiness, and anxiety that occurs when alcohol or drugs are not being used. °· An increase in amount of drugs or alcohol that is necessary to get drunk or high. °People who experience these symptoms can assess their use of drugs and alcohol by asking themselves the following questions: °· Have you been told by friends or family that they are worried about your use of alcohol or drugs? °· Do friends and family ever tell you about things you did while drinking alcohol or using drugs that you do not remember? °· Do you lie about using alcohol or drugs or about the amounts you use? °· Do you have difficulty completing daily tasks unless you use alcohol or drugs? °· Is the level of your work or school performance lower because of your drug or alcohol use? °· Do you get sick from using drugs or alcohol but keep using anyway? °· Do you feel uncomfortable in social situations unless you use alcohol or drugs? °· Do you use drugs or alcohol to help forget problems?  °An answer of yes to any of these questions may indicate chemical dependency. Professional evaluation is suggested. °Document Released: 01/02/2001 Document Revised: 04/02/2011 Document Reviewed: 03/16/2010 °ExitCare® Patient  Information ©2015 [employer]. This information is not intended to replace advice given to you by your health care provider. Make sure you discuss any questions you have with your health care provider. ° °Emergency Department Resource Guide °1) Find a Doctor and Pay Out of Pocket °Although you won't have to find out who is covered by your insurance plan, it is a good idea to ask around and get recommendations. You will then need to call the office and see if the doctor you have chosen will accept you as a new patient and what types of options they offer for patients who are self-pay. Some doctors offer discounts or will set up payment plans for their patients who do not have insurance, but you will need to ask so you aren't surprised when you get to your appointment. ° °2) Contact Your Local Health Department °Not all health departments have doctors that can see patients for sick visits, but many do, so it is worth a call to see if yours does. If you don't know where your local health department is, you can check in your phone book. The CDC also has a tool to help you locate your state's health department, and many state websites also have listings of all of their local health departments. ° °3) Find a Walk-in Clinic °If your illness is not likely to be very severe or complicated, you may want to try a walk in clinic. These are popping up all over the country in pharmacies, drugstores, and shopping centers. They're usually staffed by nurse practitioners or physician assistants that have been   trained to treat common illnesses and complaints. They're usually fairly quick and inexpensive. However, if you have serious medical issues or chronic medical problems, these are probably not your best option. ° °No Primary Care Doctor: °- Call [facility] at  832-8000 - they can help you locate a primary care doctor that  accepts your insurance, provides certain services, etc. °- Physician Referral Service-  1-800-533-3463 ° °Chronic Pain Problems: °Organization         Address  Phone   Notes  °[facility]  (336) 297-2271 Patients need to be referred by their primary care doctor.  ° °Medication Assistance: °Organization         Address  Phone   Notes  °[place] [facility] [address] °[place], [place] [address] (336) 641-8030 --Must be a resident of [place] °-- Must have NO insurance coverage whatsoever (no Medicaid/ Medicare, etc.) °-- The pt. MUST have a primary care doctor that directs their care regularly and follows them in the community °  °[facility]  (866) 331-1348   °[employer]  (888) 892-1162   ° °Agencies that provide inexpensive medical care: °Organization         Address  Phone   Notes  °[facility]  (336) 832-8035   °[facility]    (336) 832-7272   °[facility] [address] °[place], [place] [address] (336) 832-4777   °[facility] [address], °[place] (336) 271-4999   °[facility]    (336) 373-0678   °[facility]    (336) 272-1050   °[facility] ° [address], [place] Phone:  (336) 832-4444, Fax:  (336) 832-4440 Hours of Operation:  9 am - 6 pm, M-F.  Also accepts Medicaid/Medicare and self-pay.  °[facility] ° [address], [place] Phone: (336) 832-3150, Fax: (336) 832-3151. Hours of Operation:  8:30 am - 5:30 pm, M-F.  Also accepts Medicaid and self-pay.  °[facility] [place] [address], [place] Phone: (336) 878-6027   °[facility] [address], [place], [place] (336)723-1848, Ext. 123 Mondays & Thursdays: 7-9 AM.  First 15 patients are seen on a first come, first serve basis. °  ° °Medicaid-accepting [place] Providers: ° °Organization         Address  Phone   Notes  °[facility] [address],  [place] (336) 641-2100 Also accepts self-pay patients.  °[facility] [address], [place] ° (336) 856-9996   °[facility] [address], [place] (336) 288-8857   °[facility] [address] [place] [address], [place] (336) 299-7000   °Veita Bland [address], [place]  ° (336) 373-1557 Only accepts [place] Access Medicaid patients after they have their name applied to their card.  ° °Self-Pay (no insurance) in [place]: ° °Organization         Address  Phone   Notes  °Sickle Cell Patients, [facility] [address], [place] (336) 832-1970   °[facility] [address], [place] (336) 832-4400   °[facility] ° [address], [place] (336) 992-4800   °[facility]/Dr. Osei-Bonsu ° [address] [place] [address], [place] or [address], [place] (336) 841-8500 Phone number for both [place] and [place] locations is the same.  °[facility] [address], [place] (336) 299-0000   °[facility] [address] [place] [address], [place] or [address] (336) 852-7530 °(336) 878-2260   °[facility] [address], [place] (336) 350-1642, phone; (336) 294-5005, fax Sees patients 1st and 3rd Saturday of every month.  Must not qualify for public or private insurance (i.e. Medicaid, Medicare, [place] Health Choice, Veterans' Benefits) • Household income should be no more than 200% of the poverty level •The clinic cannot treat you if you are pregnant or think you are pregnant • Sexually transmitted diseases are not treated at the clinic.  ° ° °Dental Care: °Organization         Address  Phone  Notes  °[facility] [address], [place] (336) 641-6152 Accepts children up to age 21 who are enrolled in  Medicaid or [place] Health Choice; pregnant women with a Medicaid card; and children who have applied for Medicaid or [place] Health Choice, but were declined, whose parents can pay a reduced fee at time of service.  °[facility] [place]  [address], [place] (336) 641-7733 Accepts children up to age 21 who are enrolled in Medicaid or [place] Health Choice; pregnant women with a Medicaid card; and children who have applied for Medicaid or [place] Health Choice, but were declined, whose parents can pay a reduced fee at time of service.  °[facility] ° [address], [place] (336) 641-4533 Patients are seen by appointment only. Walk-ins are not accepted. [facility] will see patients 18 years of age and older. °Monday - Tuesday (8am-5pm) °Most Wednesdays (8:30-5pm) °$30 per visit, cash only  °[facility] ° [address], [place] (336) 641-4533 Patients are seen by appointment only. Walk-ins are not accepted. [facility] will see patients 18 years of age and older. °One Wednesday Evening (Monthly: Volunteer Based).  $30 per visit, cash only  °[facility]  (919) 537-3737 for adults; Children under age 4, call Graduate Pediatric Dentistry at (919) [phone]. Children aged 4-14, please call (919) [phone] to request a pediatric application. ° Dental services are provided in all areas of dental care including fillings, crowns and bridges, complete and partial dentures, implants, gum treatment, root canals, and extractions. Preventive care is also provided. Treatment is provided to both adults and children. °Patients are selected via a lottery and there is often a waiting list. °  °[facility] [address], ° ° (336) 763-8833 [email or web address] °  °[facility] [address], [place], [place] (336)723-1848, Ext. 123 Second and Fourth Thursday of each month, opens at 6:30  AM; Clinic ends at 9 AM.  Patients are seen on a first-come first-served basis, and a limited number are seen during each clinic.  ° °[facility] ° [address], [place], [place] (336) 723-7904   Eligibility Requirements °You must have lived in [place], [place], or [place] counties for at least the last three months. °  You cannot be eligible for state or federal sponsored healthcare insurance, including Veterans Administration, Medicaid, or Medicare. °  You generally cannot be eligible for healthcare insurance through your employer.  °  How to apply: °Eligibility screenings are   held every Tuesday and Wednesday afternoon from 1:00 pm until 4:00 pm. You do not need an appointment for the interview!  °[facility] [address], [place], [place] 336-631-2330   °[facility]  336-342-8273   °[facility]  336-703-3100   °[facility]  336-570-6415   ° °Behavioral Health Resources in the Community: °Intensive Outpatient Programs °Organization         Address  Phone  Notes  °[facility] [address], [place], [place] 336-878-6098   °[facility] [address], [place], [place] 336-832-9800   °[facility] [address], [place], [place] ° 336-882-2125   °[facility] [address],  °[place], [place] 1-800-853-5163 or 336-641-4981   °Substance Abuse Resources °Organization         Address  Phone  Notes  °[facility]  336-882-2125   °[facility]  336-784-9470   °[facility]  336-285-9073   °[facility]  336-845-3988   °Residential & Outpatient Substance Abuse Program  1-800-659-3381   °Psychological Services °Organization         Address  Phone  Notes  °[facility]  336- 832-9600   °[facility]  336- 378-7881   °[facility] [address], [place] 1-800-853-5163 or  336-641-4981   ° °Mobile Crisis Teams °Organization         Address  Phone  Notes  °[facility], Mobile Crisis Care Unit  1-877-626-1772   °Assertive °[facility] ° [address] [place], [place] 336-834-9664   °Sharon DeEsch [address] °[place] 336-554-5454   ° °Self-Help/Support Groups °Organization         Address  Phone             Notes  °[facility] - variety of support groups  336- 373-1402 Call for more information  °Narcotics Anonymous (NA), [facility] [address], °[place]  2 meetings at this location  ° °Residential Treatment Programs °Organization         Address  Phone  Notes  °[facility] [address],    °[place]  1-866-801-8205   °[facility] ° [address], [place], [place] 704-293-8524   °[facility] [address], [place] 336-845-3988 Admissions: 8am-3pm M-F  °[facility] [address]    °[place], [place] 336-841-1104   °[facility] [address], [place], [place] 336-379-7146   °[facility] [address]  °[place], [place] 336-285-9073   °[facility] - Intensive Outpatient [address], , [place] 336-852-3033   °[facility] [address]  °[place], [place] 1-877-615-2722 or 336-784-9470   °[facility] [address] [place], [place] 336-227-7417 Accepts Medicaid  °[facility] [address]  ° [place] 1-800-659-3381 Substance Abuse/Addiction Treatment  ° °[place] Behavioral Health Resources °Organization         Address  Phone  Notes  °[facility]  (888) 581-9988   °Julie Brannon, PhD [address] [place], [place]   (336) 349-5553 or (336) 951-0000   °[facility]   [address] °[place],  (336) 349-4454   °[facility] [address],  [place],  (336) 342-8316 Insurance/Medicaid/sponsorship   through [facility]  °[facility] [address]                                    [place], [place] (336) 342-8316 Therapy/tele-psych/case  °[facility] [address]  ° [place], [place] (336) 349-2233    °Dr. Arfeen  (336) 349-4544   °[facility] [place]  [employer] [place] [facility] 1) [address], [place] °2) [address], [place] °3)  [address], [place] (336) 349-3220 °(336) 342-7768 ° °(336) 342-8140   °[place] [facility] (336) 342-1394 or (336) 342-3537 (After Hours)    ° ° °

## 2014-01-31 NOTE — ED Provider Notes (Signed)
Per Heloise Purpura at [facility] patient can be discharged. He has no suicidal or homicidal thoughts. He presented for alcohol detox but no facilities are available. He does not demonstrate any withdrawal symptoms.  He wishes to go to the [place] which will not accept him on the weekend.  He has been instructed to followup with his PCP at the [facility] for placement options.  BP 135/76 mmHg  Pulse 71  Temp(Src) 98 F (36.7 C) (Oral)  Resp 18  Ht 6\' 1"  (1.854 m)  Wt 240 lb (108.863 kg)  BMI 31.67 kg/m2  SpO2 98%   [name], MD 01/31/14 1706

## 2014-02-03 DIAGNOSIS — I10 Essential (primary) hypertension: Secondary | ICD-10-CM | POA: Diagnosis not present

## 2014-02-03 DIAGNOSIS — J449 Chronic obstructive pulmonary disease, unspecified: Secondary | ICD-10-CM | POA: Diagnosis not present

## 2014-02-03 DIAGNOSIS — J811 Chronic pulmonary edema: Secondary | ICD-10-CM | POA: Diagnosis not present

## 2014-02-03 DIAGNOSIS — E119 Type 2 diabetes mellitus without complications: Secondary | ICD-10-CM | POA: Diagnosis not present

## 2014-02-03 DIAGNOSIS — Z7951 Long term (current) use of inhaled steroids: Secondary | ICD-10-CM | POA: Diagnosis not present

## 2014-02-03 DIAGNOSIS — R0600 Dyspnea, unspecified: Secondary | ICD-10-CM | POA: Diagnosis not present

## 2014-02-03 DIAGNOSIS — I509 Heart failure, unspecified: Secondary | ICD-10-CM | POA: Diagnosis not present

## 2014-02-03 DIAGNOSIS — I4891 Unspecified atrial fibrillation: Secondary | ICD-10-CM | POA: Diagnosis not present

## 2014-02-03 DIAGNOSIS — J441 Chronic obstructive pulmonary disease with (acute) exacerbation: Secondary | ICD-10-CM | POA: Diagnosis not present

## 2014-02-03 DIAGNOSIS — R0602 Shortness of breath: Secondary | ICD-10-CM | POA: Diagnosis not present

## 2014-02-03 DIAGNOSIS — Z7982 Long term (current) use of aspirin: Secondary | ICD-10-CM | POA: Diagnosis not present

## 2014-02-04 DIAGNOSIS — I10 Essential (primary) hypertension: Secondary | ICD-10-CM | POA: Diagnosis not present

## 2014-02-04 DIAGNOSIS — I509 Heart failure, unspecified: Secondary | ICD-10-CM | POA: Diagnosis not present

## 2014-02-04 DIAGNOSIS — I4891 Unspecified atrial fibrillation: Secondary | ICD-10-CM | POA: Diagnosis not present

## 2014-02-04 DIAGNOSIS — E119 Type 2 diabetes mellitus without complications: Secondary | ICD-10-CM | POA: Diagnosis not present

## 2014-02-04 DIAGNOSIS — J441 Chronic obstructive pulmonary disease with (acute) exacerbation: Secondary | ICD-10-CM | POA: Diagnosis not present

## 2014-02-05 DIAGNOSIS — J441 Chronic obstructive pulmonary disease with (acute) exacerbation: Secondary | ICD-10-CM | POA: Diagnosis not present

## 2014-02-05 DIAGNOSIS — E119 Type 2 diabetes mellitus without complications: Secondary | ICD-10-CM | POA: Diagnosis not present

## 2014-02-05 DIAGNOSIS — I509 Heart failure, unspecified: Secondary | ICD-10-CM | POA: Diagnosis not present

## 2014-02-05 DIAGNOSIS — I4891 Unspecified atrial fibrillation: Secondary | ICD-10-CM | POA: Diagnosis not present

## 2014-02-05 DIAGNOSIS — I10 Essential (primary) hypertension: Secondary | ICD-10-CM | POA: Diagnosis not present

## 2014-02-08 DIAGNOSIS — F17200 Nicotine dependence, unspecified, uncomplicated: Secondary | ICD-10-CM | POA: Diagnosis not present

## 2014-02-08 DIAGNOSIS — J449 Chronic obstructive pulmonary disease, unspecified: Secondary | ICD-10-CM | POA: Diagnosis not present

## 2014-02-08 DIAGNOSIS — I509 Heart failure, unspecified: Secondary | ICD-10-CM | POA: Diagnosis not present

## 2014-02-08 DIAGNOSIS — R0602 Shortness of breath: Secondary | ICD-10-CM | POA: Diagnosis not present

## 2014-02-10 DIAGNOSIS — R42 Dizziness and giddiness: Secondary | ICD-10-CM | POA: Diagnosis not present

## 2014-02-10 DIAGNOSIS — I5042 Chronic combined systolic (congestive) and diastolic (congestive) heart failure: Secondary | ICD-10-CM | POA: Diagnosis not present

## 2014-02-10 DIAGNOSIS — Z9119 Patient's noncompliance with other medical treatment and regimen: Secondary | ICD-10-CM | POA: Diagnosis not present

## 2014-02-10 DIAGNOSIS — Z885 Allergy status to narcotic agent status: Secondary | ICD-10-CM | POA: Diagnosis not present

## 2014-02-10 DIAGNOSIS — J441 Chronic obstructive pulmonary disease with (acute) exacerbation: Secondary | ICD-10-CM | POA: Diagnosis not present

## 2014-02-10 DIAGNOSIS — I480 Paroxysmal atrial fibrillation: Secondary | ICD-10-CM | POA: Diagnosis not present

## 2014-02-10 DIAGNOSIS — Z7951 Long term (current) use of inhaled steroids: Secondary | ICD-10-CM | POA: Diagnosis not present

## 2014-02-10 DIAGNOSIS — R410 Disorientation, unspecified: Secondary | ICD-10-CM | POA: Diagnosis not present

## 2014-02-10 DIAGNOSIS — Z7901 Long term (current) use of anticoagulants: Secondary | ICD-10-CM | POA: Diagnosis not present

## 2014-02-10 DIAGNOSIS — R079 Chest pain, unspecified: Secondary | ICD-10-CM | POA: Diagnosis not present

## 2014-02-10 DIAGNOSIS — Z7982 Long term (current) use of aspirin: Secondary | ICD-10-CM | POA: Diagnosis not present

## 2014-02-10 DIAGNOSIS — E11621 Type 2 diabetes mellitus with foot ulcer: Secondary | ICD-10-CM | POA: Diagnosis not present

## 2014-02-10 DIAGNOSIS — I10 Essential (primary) hypertension: Secondary | ICD-10-CM | POA: Diagnosis not present

## 2014-02-10 DIAGNOSIS — Z791 Long term (current) use of non-steroidal anti-inflammatories (NSAID): Secondary | ICD-10-CM | POA: Diagnosis not present

## 2014-02-28 DIAGNOSIS — J441 Chronic obstructive pulmonary disease with (acute) exacerbation: Secondary | ICD-10-CM | POA: Diagnosis not present

## 2014-02-28 DIAGNOSIS — E11621 Type 2 diabetes mellitus with foot ulcer: Secondary | ICD-10-CM | POA: Diagnosis not present

## 2014-02-28 DIAGNOSIS — Z9119 Patient's noncompliance with other medical treatment and regimen: Secondary | ICD-10-CM | POA: Diagnosis not present

## 2014-02-28 DIAGNOSIS — R531 Weakness: Secondary | ICD-10-CM | POA: Diagnosis not present

## 2014-02-28 DIAGNOSIS — R404 Transient alteration of awareness: Secondary | ICD-10-CM | POA: Diagnosis not present

## 2014-02-28 DIAGNOSIS — R410 Disorientation, unspecified: Secondary | ICD-10-CM | POA: Diagnosis not present

## 2014-02-28 DIAGNOSIS — I480 Paroxysmal atrial fibrillation: Secondary | ICD-10-CM | POA: Diagnosis not present

## 2014-02-28 DIAGNOSIS — R079 Chest pain, unspecified: Secondary | ICD-10-CM | POA: Diagnosis not present

## 2014-02-28 DIAGNOSIS — R42 Dizziness and giddiness: Secondary | ICD-10-CM | POA: Diagnosis not present

## 2014-03-01 DIAGNOSIS — E11621 Type 2 diabetes mellitus with foot ulcer: Secondary | ICD-10-CM | POA: Diagnosis not present

## 2014-03-01 DIAGNOSIS — J441 Chronic obstructive pulmonary disease with (acute) exacerbation: Secondary | ICD-10-CM | POA: Diagnosis not present

## 2014-03-01 DIAGNOSIS — Z9119 Patient's noncompliance with other medical treatment and regimen: Secondary | ICD-10-CM | POA: Diagnosis not present

## 2014-03-01 DIAGNOSIS — R410 Disorientation, unspecified: Secondary | ICD-10-CM | POA: Diagnosis not present

## 2014-03-01 DIAGNOSIS — I480 Paroxysmal atrial fibrillation: Secondary | ICD-10-CM | POA: Diagnosis not present

## 2014-03-03 DIAGNOSIS — R410 Disorientation, unspecified: Secondary | ICD-10-CM | POA: Diagnosis not present

## 2014-03-03 DIAGNOSIS — Z9119 Patient's noncompliance with other medical treatment and regimen: Secondary | ICD-10-CM | POA: Diagnosis not present

## 2014-03-03 DIAGNOSIS — J441 Chronic obstructive pulmonary disease with (acute) exacerbation: Secondary | ICD-10-CM | POA: Diagnosis not present

## 2014-03-03 DIAGNOSIS — I480 Paroxysmal atrial fibrillation: Secondary | ICD-10-CM | POA: Diagnosis not present

## 2014-03-03 DIAGNOSIS — E11621 Type 2 diabetes mellitus with foot ulcer: Secondary | ICD-10-CM | POA: Diagnosis not present

## 2014-03-04 DIAGNOSIS — J441 Chronic obstructive pulmonary disease with (acute) exacerbation: Secondary | ICD-10-CM | POA: Diagnosis not present

## 2014-03-04 DIAGNOSIS — I509 Heart failure, unspecified: Secondary | ICD-10-CM | POA: Diagnosis not present

## 2014-03-04 DIAGNOSIS — E119 Type 2 diabetes mellitus without complications: Secondary | ICD-10-CM | POA: Diagnosis not present

## 2014-03-04 DIAGNOSIS — J45901 Unspecified asthma with (acute) exacerbation: Secondary | ICD-10-CM | POA: Diagnosis not present

## 2014-03-09 DIAGNOSIS — Z79899 Other long term (current) drug therapy: Secondary | ICD-10-CM | POA: Diagnosis not present

## 2014-03-09 DIAGNOSIS — I509 Heart failure, unspecified: Secondary | ICD-10-CM | POA: Diagnosis not present

## 2014-03-09 DIAGNOSIS — I10 Essential (primary) hypertension: Secondary | ICD-10-CM | POA: Diagnosis not present

## 2014-03-09 DIAGNOSIS — T23251A Burn of second degree of right palm, initial encounter: Secondary | ICD-10-CM | POA: Diagnosis not present

## 2014-03-09 DIAGNOSIS — J449 Chronic obstructive pulmonary disease, unspecified: Secondary | ICD-10-CM | POA: Diagnosis not present

## 2014-03-09 DIAGNOSIS — E119 Type 2 diabetes mellitus without complications: Secondary | ICD-10-CM | POA: Diagnosis not present

## 2014-03-09 DIAGNOSIS — Y92019 Unspecified place in single-family (private) house as the place of occurrence of the external cause: Secondary | ICD-10-CM | POA: Diagnosis not present

## 2014-03-09 DIAGNOSIS — T2026XA Burn of second degree of forehead and cheek, initial encounter: Secondary | ICD-10-CM | POA: Diagnosis not present

## 2014-03-09 DIAGNOSIS — T2020XA Burn of second degree of head, face, and neck, unspecified site, initial encounter: Secondary | ICD-10-CM | POA: Diagnosis not present

## 2014-03-09 DIAGNOSIS — Z9981 Dependence on supplemental oxygen: Secondary | ICD-10-CM | POA: Diagnosis not present

## 2014-03-09 DIAGNOSIS — Z7982 Long term (current) use of aspirin: Secondary | ICD-10-CM | POA: Diagnosis not present

## 2014-03-09 DIAGNOSIS — F17200 Nicotine dependence, unspecified, uncomplicated: Secondary | ICD-10-CM | POA: Diagnosis not present

## 2014-03-09 DIAGNOSIS — Y939 Activity, unspecified: Secondary | ICD-10-CM | POA: Diagnosis not present

## 2014-03-09 DIAGNOSIS — Z8249 Family history of ischemic heart disease and other diseases of the circulatory system: Secondary | ICD-10-CM | POA: Diagnosis not present

## 2014-03-09 DIAGNOSIS — T2029XA Burn of second degree of multiple sites of head, face, and neck, initial encounter: Secondary | ICD-10-CM | POA: Diagnosis not present

## 2014-03-09 DIAGNOSIS — I4891 Unspecified atrial fibrillation: Secondary | ICD-10-CM | POA: Diagnosis not present

## 2014-03-09 DIAGNOSIS — X0801XA Exposure to bed fire due to burning cigarette, initial encounter: Secondary | ICD-10-CM | POA: Diagnosis not present

## 2014-03-09 DIAGNOSIS — X088XXA Exposure to other specified smoke, fire and flames, initial encounter: Secondary | ICD-10-CM | POA: Diagnosis not present

## 2014-03-09 DIAGNOSIS — T310 Burns involving less than 10% of body surface: Secondary | ICD-10-CM | POA: Diagnosis not present

## 2014-03-09 DIAGNOSIS — R079 Chest pain, unspecified: Secondary | ICD-10-CM | POA: Diagnosis not present

## 2014-03-09 DIAGNOSIS — Z7901 Long term (current) use of anticoagulants: Secondary | ICD-10-CM | POA: Diagnosis not present

## 2014-03-09 DIAGNOSIS — T2023XA Burn of second degree of chin, initial encounter: Secondary | ICD-10-CM | POA: Diagnosis not present

## 2014-03-09 DIAGNOSIS — R002 Palpitations: Secondary | ICD-10-CM | POA: Diagnosis not present

## 2014-03-10 DIAGNOSIS — J449 Chronic obstructive pulmonary disease, unspecified: Secondary | ICD-10-CM | POA: Diagnosis not present

## 2014-03-10 DIAGNOSIS — R002 Palpitations: Secondary | ICD-10-CM | POA: Diagnosis not present

## 2014-03-10 DIAGNOSIS — I10 Essential (primary) hypertension: Secondary | ICD-10-CM | POA: Diagnosis not present

## 2014-03-10 DIAGNOSIS — R079 Chest pain, unspecified: Secondary | ICD-10-CM | POA: Diagnosis not present

## 2014-03-10 DIAGNOSIS — I4891 Unspecified atrial fibrillation: Secondary | ICD-10-CM | POA: Diagnosis not present

## 2014-03-10 DIAGNOSIS — E119 Type 2 diabetes mellitus without complications: Secondary | ICD-10-CM | POA: Diagnosis not present

## 2014-03-10 DIAGNOSIS — R000 Tachycardia, unspecified: Secondary | ICD-10-CM | POA: Diagnosis not present

## 2014-03-10 DIAGNOSIS — I499 Cardiac arrhythmia, unspecified: Secondary | ICD-10-CM | POA: Diagnosis not present

## 2014-03-11 DIAGNOSIS — J449 Chronic obstructive pulmonary disease, unspecified: Secondary | ICD-10-CM | POA: Diagnosis not present

## 2014-03-11 DIAGNOSIS — I10 Essential (primary) hypertension: Secondary | ICD-10-CM | POA: Diagnosis not present

## 2014-03-11 DIAGNOSIS — I4891 Unspecified atrial fibrillation: Secondary | ICD-10-CM | POA: Diagnosis not present

## 2014-03-11 DIAGNOSIS — R079 Chest pain, unspecified: Secondary | ICD-10-CM | POA: Diagnosis not present

## 2014-03-11 DIAGNOSIS — E119 Type 2 diabetes mellitus without complications: Secondary | ICD-10-CM | POA: Diagnosis not present

## 2014-03-15 DIAGNOSIS — R229 Localized swelling, mass and lump, unspecified: Secondary | ICD-10-CM | POA: Diagnosis not present

## 2014-03-18 DIAGNOSIS — T310 Burns involving less than 10% of body surface: Secondary | ICD-10-CM | POA: Diagnosis not present

## 2014-03-18 DIAGNOSIS — J440 Chronic obstructive pulmonary disease with acute lower respiratory infection: Secondary | ICD-10-CM | POA: Diagnosis not present

## 2014-03-19 DIAGNOSIS — E119 Type 2 diabetes mellitus without complications: Secondary | ICD-10-CM | POA: Diagnosis not present

## 2014-03-21 DIAGNOSIS — E119 Type 2 diabetes mellitus without complications: Secondary | ICD-10-CM | POA: Diagnosis not present

## 2014-03-21 DIAGNOSIS — I2510 Atherosclerotic heart disease of native coronary artery without angina pectoris: Secondary | ICD-10-CM | POA: Diagnosis not present

## 2014-03-21 DIAGNOSIS — R000 Tachycardia, unspecified: Secondary | ICD-10-CM | POA: Diagnosis not present

## 2014-03-21 DIAGNOSIS — Z7901 Long term (current) use of anticoagulants: Secondary | ICD-10-CM | POA: Diagnosis not present

## 2014-03-21 DIAGNOSIS — Z79899 Other long term (current) drug therapy: Secondary | ICD-10-CM | POA: Diagnosis not present

## 2014-03-21 DIAGNOSIS — E780 Pure hypercholesterolemia: Secondary | ICD-10-CM | POA: Diagnosis not present

## 2014-03-21 DIAGNOSIS — I10 Essential (primary) hypertension: Secondary | ICD-10-CM | POA: Diagnosis not present

## 2014-03-21 DIAGNOSIS — Z87891 Personal history of nicotine dependence: Secondary | ICD-10-CM | POA: Diagnosis not present

## 2014-03-21 DIAGNOSIS — I5030 Unspecified diastolic (congestive) heart failure: Secondary | ICD-10-CM | POA: Diagnosis not present

## 2014-03-21 DIAGNOSIS — I4891 Unspecified atrial fibrillation: Secondary | ICD-10-CM | POA: Diagnosis not present

## 2014-03-21 DIAGNOSIS — J449 Chronic obstructive pulmonary disease, unspecified: Secondary | ICD-10-CM | POA: Diagnosis not present

## 2014-03-21 DIAGNOSIS — Z7982 Long term (current) use of aspirin: Secondary | ICD-10-CM | POA: Diagnosis not present

## 2014-03-21 DIAGNOSIS — I499 Cardiac arrhythmia, unspecified: Secondary | ICD-10-CM | POA: Diagnosis not present

## 2014-03-21 DIAGNOSIS — I482 Chronic atrial fibrillation: Secondary | ICD-10-CM | POA: Diagnosis not present

## 2014-03-23 DIAGNOSIS — R69 Illness, unspecified: Secondary | ICD-10-CM | POA: Diagnosis not present

## 2014-03-28 DIAGNOSIS — I4891 Unspecified atrial fibrillation: Secondary | ICD-10-CM | POA: Diagnosis not present

## 2014-03-28 DIAGNOSIS — J90 Pleural effusion, not elsewhere classified: Secondary | ICD-10-CM | POA: Diagnosis not present

## 2014-03-28 DIAGNOSIS — N179 Acute kidney failure, unspecified: Secondary | ICD-10-CM | POA: Diagnosis not present

## 2014-03-28 DIAGNOSIS — R0602 Shortness of breath: Secondary | ICD-10-CM | POA: Diagnosis not present

## 2014-03-28 DIAGNOSIS — N186 End stage renal disease: Secondary | ICD-10-CM | POA: Diagnosis not present

## 2014-03-28 DIAGNOSIS — R531 Weakness: Secondary | ICD-10-CM | POA: Diagnosis not present

## 2014-03-28 DIAGNOSIS — I120 Hypertensive chronic kidney disease with stage 5 chronic kidney disease or end stage renal disease: Secondary | ICD-10-CM | POA: Diagnosis not present

## 2014-03-28 DIAGNOSIS — R404 Transient alteration of awareness: Secondary | ICD-10-CM | POA: Diagnosis not present

## 2014-03-28 DIAGNOSIS — J189 Pneumonia, unspecified organism: Secondary | ICD-10-CM | POA: Diagnosis not present

## 2014-03-29 DIAGNOSIS — N186 End stage renal disease: Secondary | ICD-10-CM | POA: Diagnosis not present

## 2014-03-29 DIAGNOSIS — I4891 Unspecified atrial fibrillation: Secondary | ICD-10-CM | POA: Diagnosis not present

## 2014-03-29 DIAGNOSIS — N179 Acute kidney failure, unspecified: Secondary | ICD-10-CM | POA: Diagnosis not present

## 2014-03-29 DIAGNOSIS — I120 Hypertensive chronic kidney disease with stage 5 chronic kidney disease or end stage renal disease: Secondary | ICD-10-CM | POA: Diagnosis not present

## 2014-03-29 DIAGNOSIS — J189 Pneumonia, unspecified organism: Secondary | ICD-10-CM | POA: Diagnosis not present

## 2014-03-30 DIAGNOSIS — N179 Acute kidney failure, unspecified: Secondary | ICD-10-CM | POA: Diagnosis not present

## 2014-03-30 DIAGNOSIS — J189 Pneumonia, unspecified organism: Secondary | ICD-10-CM | POA: Diagnosis not present

## 2014-03-30 DIAGNOSIS — I4891 Unspecified atrial fibrillation: Secondary | ICD-10-CM | POA: Diagnosis not present

## 2014-03-30 DIAGNOSIS — N186 End stage renal disease: Secondary | ICD-10-CM | POA: Diagnosis not present

## 2014-03-30 DIAGNOSIS — I120 Hypertensive chronic kidney disease with stage 5 chronic kidney disease or end stage renal disease: Secondary | ICD-10-CM | POA: Diagnosis not present

## 2014-03-31 DIAGNOSIS — R069 Unspecified abnormalities of breathing: Secondary | ICD-10-CM | POA: Diagnosis not present

## 2014-03-31 DIAGNOSIS — R0602 Shortness of breath: Secondary | ICD-10-CM | POA: Diagnosis not present

## 2014-03-31 DIAGNOSIS — Z8249 Family history of ischemic heart disease and other diseases of the circulatory system: Secondary | ICD-10-CM | POA: Diagnosis not present

## 2014-03-31 DIAGNOSIS — J189 Pneumonia, unspecified organism: Secondary | ICD-10-CM | POA: Diagnosis not present

## 2014-03-31 DIAGNOSIS — Z87891 Personal history of nicotine dependence: Secondary | ICD-10-CM | POA: Diagnosis not present

## 2014-03-31 DIAGNOSIS — I509 Heart failure, unspecified: Secondary | ICD-10-CM | POA: Diagnosis not present

## 2014-03-31 DIAGNOSIS — I10 Essential (primary) hypertension: Secondary | ICD-10-CM | POA: Diagnosis not present

## 2014-03-31 DIAGNOSIS — E119 Type 2 diabetes mellitus without complications: Secondary | ICD-10-CM | POA: Diagnosis not present

## 2014-03-31 DIAGNOSIS — E780 Pure hypercholesterolemia: Secondary | ICD-10-CM | POA: Diagnosis not present

## 2014-03-31 DIAGNOSIS — J449 Chronic obstructive pulmonary disease, unspecified: Secondary | ICD-10-CM | POA: Diagnosis not present

## 2014-03-31 DIAGNOSIS — Z79899 Other long term (current) drug therapy: Secondary | ICD-10-CM | POA: Diagnosis not present

## 2014-03-31 DIAGNOSIS — I4891 Unspecified atrial fibrillation: Secondary | ICD-10-CM | POA: Diagnosis not present

## 2014-04-01 DIAGNOSIS — Z8249 Family history of ischemic heart disease and other diseases of the circulatory system: Secondary | ICD-10-CM | POA: Diagnosis not present

## 2014-04-01 DIAGNOSIS — I4891 Unspecified atrial fibrillation: Secondary | ICD-10-CM | POA: Diagnosis not present

## 2014-04-01 DIAGNOSIS — R062 Wheezing: Secondary | ICD-10-CM | POA: Diagnosis not present

## 2014-04-01 DIAGNOSIS — I5032 Chronic diastolic (congestive) heart failure: Secondary | ICD-10-CM | POA: Diagnosis not present

## 2014-04-01 DIAGNOSIS — Z885 Allergy status to narcotic agent status: Secondary | ICD-10-CM | POA: Diagnosis not present

## 2014-04-01 DIAGNOSIS — Z7982 Long term (current) use of aspirin: Secondary | ICD-10-CM | POA: Diagnosis not present

## 2014-04-01 DIAGNOSIS — I509 Heart failure, unspecified: Secondary | ICD-10-CM | POA: Diagnosis not present

## 2014-04-01 DIAGNOSIS — Z7902 Long term (current) use of antithrombotics/antiplatelets: Secondary | ICD-10-CM | POA: Diagnosis not present

## 2014-04-01 DIAGNOSIS — E876 Hypokalemia: Secondary | ICD-10-CM | POA: Diagnosis not present

## 2014-04-01 DIAGNOSIS — Z87891 Personal history of nicotine dependence: Secondary | ICD-10-CM | POA: Diagnosis not present

## 2014-04-01 DIAGNOSIS — R0602 Shortness of breath: Secondary | ICD-10-CM | POA: Diagnosis not present

## 2014-04-01 DIAGNOSIS — E119 Type 2 diabetes mellitus without complications: Secondary | ICD-10-CM | POA: Diagnosis not present

## 2014-04-01 DIAGNOSIS — J449 Chronic obstructive pulmonary disease, unspecified: Secondary | ICD-10-CM | POA: Diagnosis not present

## 2014-04-01 DIAGNOSIS — J9811 Atelectasis: Secondary | ICD-10-CM | POA: Diagnosis not present

## 2014-04-01 DIAGNOSIS — F329 Major depressive disorder, single episode, unspecified: Secondary | ICD-10-CM | POA: Diagnosis not present

## 2014-04-01 DIAGNOSIS — E780 Pure hypercholesterolemia: Secondary | ICD-10-CM | POA: Diagnosis not present

## 2014-04-01 DIAGNOSIS — J441 Chronic obstructive pulmonary disease with (acute) exacerbation: Secondary | ICD-10-CM | POA: Diagnosis not present

## 2014-04-01 DIAGNOSIS — R079 Chest pain, unspecified: Secondary | ICD-10-CM | POA: Diagnosis not present

## 2014-04-01 DIAGNOSIS — N400 Enlarged prostate without lower urinary tract symptoms: Secondary | ICD-10-CM | POA: Diagnosis not present

## 2014-04-01 DIAGNOSIS — R000 Tachycardia, unspecified: Secondary | ICD-10-CM | POA: Diagnosis not present

## 2014-04-01 DIAGNOSIS — Z79899 Other long term (current) drug therapy: Secondary | ICD-10-CM | POA: Diagnosis not present

## 2014-04-01 DIAGNOSIS — I10 Essential (primary) hypertension: Secondary | ICD-10-CM | POA: Diagnosis not present

## 2014-04-06 DIAGNOSIS — I480 Paroxysmal atrial fibrillation: Secondary | ICD-10-CM | POA: Diagnosis not present

## 2014-04-06 DIAGNOSIS — J189 Pneumonia, unspecified organism: Secondary | ICD-10-CM | POA: Diagnosis not present

## 2014-04-06 DIAGNOSIS — R0602 Shortness of breath: Secondary | ICD-10-CM | POA: Diagnosis not present

## 2014-04-06 DIAGNOSIS — J440 Chronic obstructive pulmonary disease with acute lower respiratory infection: Secondary | ICD-10-CM | POA: Diagnosis not present

## 2014-04-07 ENCOUNTER — Telehealth: Payer: Self-pay | Admitting: Cardiology

## 2014-04-07 NOTE — Telephone Encounter (Signed)
Numerous attempts to contact patient with recall letters with no success.  Chase Herrera [[id number]] 02/04/2014 8:31 AM New [10]   Chase Herrera [[id number]] 02/04/2014 8:31 AM Notification Sent [20]   Chase Herrera [[id number]] 03/10/2014 3:54 PM Notification Sent [20]

## 2014-06-11 DIAGNOSIS — R069 Unspecified abnormalities of breathing: Secondary | ICD-10-CM | POA: Diagnosis not present

## 2014-06-23 DIAGNOSIS — Z7982 Long term (current) use of aspirin: Secondary | ICD-10-CM | POA: Diagnosis not present

## 2014-06-23 DIAGNOSIS — I482 Chronic atrial fibrillation: Secondary | ICD-10-CM | POA: Diagnosis not present

## 2014-06-23 DIAGNOSIS — R0602 Shortness of breath: Secondary | ICD-10-CM | POA: Diagnosis not present

## 2014-06-23 DIAGNOSIS — Z79899 Other long term (current) drug therapy: Secondary | ICD-10-CM | POA: Diagnosis not present

## 2014-06-23 DIAGNOSIS — R069 Unspecified abnormalities of breathing: Secondary | ICD-10-CM | POA: Diagnosis not present

## 2014-06-23 DIAGNOSIS — Z885 Allergy status to narcotic agent status: Secondary | ICD-10-CM | POA: Diagnosis not present

## 2014-06-23 DIAGNOSIS — J441 Chronic obstructive pulmonary disease with (acute) exacerbation: Secondary | ICD-10-CM | POA: Diagnosis not present

## 2014-06-23 DIAGNOSIS — F17201 Nicotine dependence, unspecified, in remission: Secondary | ICD-10-CM | POA: Diagnosis not present

## 2014-06-23 DIAGNOSIS — I10 Essential (primary) hypertension: Secondary | ICD-10-CM | POA: Diagnosis not present

## 2014-06-23 DIAGNOSIS — Z8249 Family history of ischemic heart disease and other diseases of the circulatory system: Secondary | ICD-10-CM | POA: Diagnosis not present

## 2014-06-23 DIAGNOSIS — I5042 Chronic combined systolic (congestive) and diastolic (congestive) heart failure: Secondary | ICD-10-CM | POA: Diagnosis not present

## 2014-06-23 DIAGNOSIS — Z7951 Long term (current) use of inhaled steroids: Secondary | ICD-10-CM | POA: Diagnosis not present

## 2014-06-23 DIAGNOSIS — E119 Type 2 diabetes mellitus without complications: Secondary | ICD-10-CM | POA: Diagnosis not present

## 2014-06-23 DIAGNOSIS — K449 Diaphragmatic hernia without obstruction or gangrene: Secondary | ICD-10-CM | POA: Diagnosis not present

## 2014-06-23 DIAGNOSIS — J9811 Atelectasis: Secondary | ICD-10-CM | POA: Diagnosis not present

## 2014-06-24 DIAGNOSIS — I5042 Chronic combined systolic (congestive) and diastolic (congestive) heart failure: Secondary | ICD-10-CM | POA: Diagnosis not present

## 2014-06-24 DIAGNOSIS — I10 Essential (primary) hypertension: Secondary | ICD-10-CM | POA: Diagnosis not present

## 2014-06-24 DIAGNOSIS — E119 Type 2 diabetes mellitus without complications: Secondary | ICD-10-CM | POA: Diagnosis not present

## 2014-06-24 DIAGNOSIS — K449 Diaphragmatic hernia without obstruction or gangrene: Secondary | ICD-10-CM | POA: Diagnosis not present

## 2014-06-24 DIAGNOSIS — J441 Chronic obstructive pulmonary disease with (acute) exacerbation: Secondary | ICD-10-CM | POA: Diagnosis not present

## 2014-06-25 DIAGNOSIS — I5042 Chronic combined systolic (congestive) and diastolic (congestive) heart failure: Secondary | ICD-10-CM | POA: Diagnosis not present

## 2014-06-25 DIAGNOSIS — K449 Diaphragmatic hernia without obstruction or gangrene: Secondary | ICD-10-CM | POA: Diagnosis not present

## 2014-06-25 DIAGNOSIS — J441 Chronic obstructive pulmonary disease with (acute) exacerbation: Secondary | ICD-10-CM | POA: Diagnosis not present

## 2014-06-25 DIAGNOSIS — I10 Essential (primary) hypertension: Secondary | ICD-10-CM | POA: Diagnosis not present

## 2014-06-25 DIAGNOSIS — E119 Type 2 diabetes mellitus without complications: Secondary | ICD-10-CM | POA: Diagnosis not present

## 2014-07-06 DIAGNOSIS — R069 Unspecified abnormalities of breathing: Secondary | ICD-10-CM | POA: Diagnosis not present

## 2014-07-21 DIAGNOSIS — R0682 Tachypnea, not elsewhere classified: Secondary | ICD-10-CM | POA: Diagnosis not present

## 2014-07-24 DIAGNOSIS — J449 Chronic obstructive pulmonary disease, unspecified: Secondary | ICD-10-CM | POA: Diagnosis not present

## 2014-07-24 DIAGNOSIS — F17210 Nicotine dependence, cigarettes, uncomplicated: Secondary | ICD-10-CM | POA: Diagnosis not present

## 2014-07-24 DIAGNOSIS — I10 Essential (primary) hypertension: Secondary | ICD-10-CM | POA: Diagnosis not present

## 2014-07-24 DIAGNOSIS — R0602 Shortness of breath: Secondary | ICD-10-CM | POA: Diagnosis not present

## 2014-07-24 DIAGNOSIS — Z7901 Long term (current) use of anticoagulants: Secondary | ICD-10-CM | POA: Diagnosis not present

## 2014-07-24 DIAGNOSIS — E119 Type 2 diabetes mellitus without complications: Secondary | ICD-10-CM | POA: Diagnosis not present

## 2014-07-24 DIAGNOSIS — R05 Cough: Secondary | ICD-10-CM | POA: Diagnosis not present

## 2014-07-24 DIAGNOSIS — Z79899 Other long term (current) drug therapy: Secondary | ICD-10-CM | POA: Diagnosis not present

## 2014-07-24 DIAGNOSIS — Z7982 Long term (current) use of aspirin: Secondary | ICD-10-CM | POA: Diagnosis not present

## 2014-08-08 DIAGNOSIS — K5900 Constipation, unspecified: Secondary | ICD-10-CM | POA: Diagnosis not present

## 2014-08-08 DIAGNOSIS — Z7901 Long term (current) use of anticoagulants: Secondary | ICD-10-CM | POA: Diagnosis not present

## 2014-08-08 DIAGNOSIS — Z87891 Personal history of nicotine dependence: Secondary | ICD-10-CM | POA: Diagnosis not present

## 2014-08-08 DIAGNOSIS — E119 Type 2 diabetes mellitus without complications: Secondary | ICD-10-CM | POA: Diagnosis not present

## 2014-08-08 DIAGNOSIS — Z79899 Other long term (current) drug therapy: Secondary | ICD-10-CM | POA: Diagnosis not present

## 2014-08-08 DIAGNOSIS — I10 Essential (primary) hypertension: Secondary | ICD-10-CM | POA: Diagnosis not present

## 2014-08-08 DIAGNOSIS — R069 Unspecified abnormalities of breathing: Secondary | ICD-10-CM | POA: Diagnosis not present

## 2014-08-08 DIAGNOSIS — J449 Chronic obstructive pulmonary disease, unspecified: Secondary | ICD-10-CM | POA: Diagnosis not present

## 2014-08-08 DIAGNOSIS — K625 Hemorrhage of anus and rectum: Secondary | ICD-10-CM | POA: Diagnosis not present

## 2014-08-08 DIAGNOSIS — Z7982 Long term (current) use of aspirin: Secondary | ICD-10-CM | POA: Diagnosis not present

## 2014-08-10 DIAGNOSIS — Z0000 Encounter for general adult medical examination without abnormal findings: Secondary | ICD-10-CM | POA: Diagnosis not present

## 2014-08-10 DIAGNOSIS — E1165 Type 2 diabetes mellitus with hyperglycemia: Secondary | ICD-10-CM | POA: Diagnosis not present

## 2014-08-10 DIAGNOSIS — Z125 Encounter for screening for malignant neoplasm of prostate: Secondary | ICD-10-CM | POA: Diagnosis not present

## 2014-08-10 DIAGNOSIS — J440 Chronic obstructive pulmonary disease with acute lower respiratory infection: Secondary | ICD-10-CM | POA: Diagnosis not present

## 2014-08-29 DIAGNOSIS — R0602 Shortness of breath: Secondary | ICD-10-CM | POA: Diagnosis not present

## 2014-08-29 DIAGNOSIS — Z87891 Personal history of nicotine dependence: Secondary | ICD-10-CM | POA: Diagnosis not present

## 2014-08-29 DIAGNOSIS — Z7982 Long term (current) use of aspirin: Secondary | ICD-10-CM | POA: Diagnosis not present

## 2014-08-29 DIAGNOSIS — Z87442 Personal history of urinary calculi: Secondary | ICD-10-CM | POA: Diagnosis not present

## 2014-08-29 DIAGNOSIS — R339 Retention of urine, unspecified: Secondary | ICD-10-CM | POA: Diagnosis not present

## 2014-08-29 DIAGNOSIS — I10 Essential (primary) hypertension: Secondary | ICD-10-CM | POA: Diagnosis not present

## 2014-08-29 DIAGNOSIS — Z79899 Other long term (current) drug therapy: Secondary | ICD-10-CM | POA: Diagnosis not present

## 2014-08-29 DIAGNOSIS — Z7901 Long term (current) use of anticoagulants: Secondary | ICD-10-CM | POA: Diagnosis not present

## 2014-08-29 DIAGNOSIS — E119 Type 2 diabetes mellitus without complications: Secondary | ICD-10-CM | POA: Diagnosis not present

## 2014-08-29 DIAGNOSIS — J441 Chronic obstructive pulmonary disease with (acute) exacerbation: Secondary | ICD-10-CM | POA: Diagnosis not present

## 2014-08-29 DIAGNOSIS — J449 Chronic obstructive pulmonary disease, unspecified: Secondary | ICD-10-CM | POA: Diagnosis not present

## 2014-09-07 DIAGNOSIS — R339 Retention of urine, unspecified: Secondary | ICD-10-CM | POA: Diagnosis not present

## 2014-09-24 DIAGNOSIS — I4891 Unspecified atrial fibrillation: Secondary | ICD-10-CM | POA: Diagnosis not present

## 2014-09-24 DIAGNOSIS — Z7901 Long term (current) use of anticoagulants: Secondary | ICD-10-CM | POA: Diagnosis not present

## 2014-09-24 DIAGNOSIS — Z87442 Personal history of urinary calculi: Secondary | ICD-10-CM | POA: Diagnosis not present

## 2014-09-24 DIAGNOSIS — J449 Chronic obstructive pulmonary disease, unspecified: Secondary | ICD-10-CM | POA: Diagnosis not present

## 2014-09-24 DIAGNOSIS — Z79899 Other long term (current) drug therapy: Secondary | ICD-10-CM | POA: Diagnosis not present

## 2014-09-24 DIAGNOSIS — E780 Pure hypercholesterolemia: Secondary | ICD-10-CM | POA: Diagnosis not present

## 2014-09-24 DIAGNOSIS — I5030 Unspecified diastolic (congestive) heart failure: Secondary | ICD-10-CM | POA: Diagnosis not present

## 2014-09-24 DIAGNOSIS — R300 Dysuria: Secondary | ICD-10-CM | POA: Diagnosis not present

## 2014-09-24 DIAGNOSIS — Z7982 Long term (current) use of aspirin: Secondary | ICD-10-CM | POA: Diagnosis not present

## 2014-09-24 DIAGNOSIS — N390 Urinary tract infection, site not specified: Secondary | ICD-10-CM | POA: Diagnosis not present

## 2014-09-24 DIAGNOSIS — Z8249 Family history of ischemic heart disease and other diseases of the circulatory system: Secondary | ICD-10-CM | POA: Diagnosis not present

## 2014-09-24 DIAGNOSIS — I10 Essential (primary) hypertension: Secondary | ICD-10-CM | POA: Diagnosis not present

## 2014-09-24 DIAGNOSIS — R339 Retention of urine, unspecified: Secondary | ICD-10-CM | POA: Diagnosis not present

## 2014-09-24 DIAGNOSIS — E119 Type 2 diabetes mellitus without complications: Secondary | ICD-10-CM | POA: Diagnosis not present

## 2014-09-24 DIAGNOSIS — Z87891 Personal history of nicotine dependence: Secondary | ICD-10-CM | POA: Diagnosis not present

## 2014-09-29 DIAGNOSIS — J449 Chronic obstructive pulmonary disease, unspecified: Secondary | ICD-10-CM | POA: Diagnosis not present

## 2014-09-29 DIAGNOSIS — Z6835 Body mass index (BMI) 35.0-35.9, adult: Secondary | ICD-10-CM | POA: Diagnosis not present

## 2014-09-29 DIAGNOSIS — E119 Type 2 diabetes mellitus without complications: Secondary | ICD-10-CM | POA: Diagnosis not present

## 2014-09-29 DIAGNOSIS — Z79899 Other long term (current) drug therapy: Secondary | ICD-10-CM | POA: Diagnosis not present

## 2014-09-29 DIAGNOSIS — I519 Heart disease, unspecified: Secondary | ICD-10-CM | POA: Diagnosis not present

## 2014-09-29 DIAGNOSIS — Z801 Family history of malignant neoplasm of trachea, bronchus and lung: Secondary | ICD-10-CM | POA: Diagnosis not present

## 2014-09-29 DIAGNOSIS — Z87891 Personal history of nicotine dependence: Secondary | ICD-10-CM | POA: Diagnosis not present

## 2014-09-29 DIAGNOSIS — Z7901 Long term (current) use of anticoagulants: Secondary | ICD-10-CM | POA: Diagnosis not present

## 2014-09-29 DIAGNOSIS — R339 Retention of urine, unspecified: Secondary | ICD-10-CM | POA: Diagnosis not present

## 2014-09-29 DIAGNOSIS — I10 Essential (primary) hypertension: Secondary | ICD-10-CM | POA: Diagnosis not present

## 2014-09-29 DIAGNOSIS — Z833 Family history of diabetes mellitus: Secondary | ICD-10-CM | POA: Diagnosis not present

## 2014-09-29 DIAGNOSIS — Z87442 Personal history of urinary calculi: Secondary | ICD-10-CM | POA: Diagnosis not present

## 2014-09-29 DIAGNOSIS — Z7951 Long term (current) use of inhaled steroids: Secondary | ICD-10-CM | POA: Diagnosis not present

## 2014-09-29 DIAGNOSIS — Z7982 Long term (current) use of aspirin: Secondary | ICD-10-CM | POA: Diagnosis not present

## 2014-09-30 DIAGNOSIS — Z87891 Personal history of nicotine dependence: Secondary | ICD-10-CM | POA: Diagnosis not present

## 2014-09-30 DIAGNOSIS — E119 Type 2 diabetes mellitus without complications: Secondary | ICD-10-CM | POA: Diagnosis not present

## 2014-09-30 DIAGNOSIS — Z7901 Long term (current) use of anticoagulants: Secondary | ICD-10-CM | POA: Diagnosis not present

## 2014-09-30 DIAGNOSIS — I10 Essential (primary) hypertension: Secondary | ICD-10-CM | POA: Diagnosis not present

## 2014-09-30 DIAGNOSIS — Z79899 Other long term (current) drug therapy: Secondary | ICD-10-CM | POA: Diagnosis not present

## 2014-09-30 DIAGNOSIS — Z7982 Long term (current) use of aspirin: Secondary | ICD-10-CM | POA: Diagnosis not present

## 2014-09-30 DIAGNOSIS — M25472 Effusion, left ankle: Secondary | ICD-10-CM | POA: Diagnosis not present

## 2014-09-30 DIAGNOSIS — I509 Heart failure, unspecified: Secondary | ICD-10-CM | POA: Diagnosis not present

## 2014-09-30 DIAGNOSIS — Z8249 Family history of ischemic heart disease and other diseases of the circulatory system: Secondary | ICD-10-CM | POA: Diagnosis not present

## 2014-09-30 DIAGNOSIS — I4891 Unspecified atrial fibrillation: Secondary | ICD-10-CM | POA: Diagnosis not present

## 2014-09-30 DIAGNOSIS — E780 Pure hypercholesterolemia: Secondary | ICD-10-CM | POA: Diagnosis not present

## 2014-09-30 DIAGNOSIS — J449 Chronic obstructive pulmonary disease, unspecified: Secondary | ICD-10-CM | POA: Diagnosis not present

## 2014-09-30 DIAGNOSIS — R609 Edema, unspecified: Secondary | ICD-10-CM | POA: Diagnosis not present

## 2014-09-30 DIAGNOSIS — F329 Major depressive disorder, single episode, unspecified: Secondary | ICD-10-CM | POA: Diagnosis not present

## 2014-10-01 DIAGNOSIS — R339 Retention of urine, unspecified: Secondary | ICD-10-CM | POA: Diagnosis not present

## 2014-10-01 DIAGNOSIS — T839XXA Unspecified complication of genitourinary prosthetic device, implant and graft, initial encounter: Secondary | ICD-10-CM | POA: Diagnosis not present

## 2014-10-11 DIAGNOSIS — I482 Chronic atrial fibrillation: Secondary | ICD-10-CM | POA: Diagnosis not present

## 2014-10-11 DIAGNOSIS — Z79899 Other long term (current) drug therapy: Secondary | ICD-10-CM | POA: Diagnosis not present

## 2014-10-11 DIAGNOSIS — R600 Localized edema: Secondary | ICD-10-CM | POA: Diagnosis not present

## 2014-10-11 DIAGNOSIS — Z836 Family history of other diseases of the respiratory system: Secondary | ICD-10-CM | POA: Diagnosis not present

## 2014-10-11 DIAGNOSIS — Z7951 Long term (current) use of inhaled steroids: Secondary | ICD-10-CM | POA: Diagnosis not present

## 2014-10-11 DIAGNOSIS — M7989 Other specified soft tissue disorders: Secondary | ICD-10-CM | POA: Diagnosis not present

## 2014-10-11 DIAGNOSIS — R0602 Shortness of breath: Secondary | ICD-10-CM | POA: Diagnosis not present

## 2014-10-11 DIAGNOSIS — I10 Essential (primary) hypertension: Secondary | ICD-10-CM | POA: Diagnosis not present

## 2014-10-11 DIAGNOSIS — Z792 Long term (current) use of antibiotics: Secondary | ICD-10-CM | POA: Diagnosis not present

## 2014-10-11 DIAGNOSIS — Z8249 Family history of ischemic heart disease and other diseases of the circulatory system: Secondary | ICD-10-CM | POA: Diagnosis not present

## 2014-10-11 DIAGNOSIS — J441 Chronic obstructive pulmonary disease with (acute) exacerbation: Secondary | ICD-10-CM | POA: Diagnosis not present

## 2014-10-11 DIAGNOSIS — E119 Type 2 diabetes mellitus without complications: Secondary | ICD-10-CM | POA: Diagnosis not present

## 2014-10-11 DIAGNOSIS — R079 Chest pain, unspecified: Secondary | ICD-10-CM | POA: Diagnosis not present

## 2014-10-11 DIAGNOSIS — I871 Compression of vein: Secondary | ICD-10-CM | POA: Diagnosis not present

## 2014-10-11 DIAGNOSIS — I5042 Chronic combined systolic (congestive) and diastolic (congestive) heart failure: Secondary | ICD-10-CM | POA: Diagnosis not present

## 2014-10-11 DIAGNOSIS — R0902 Hypoxemia: Secondary | ICD-10-CM | POA: Diagnosis not present

## 2014-10-11 DIAGNOSIS — K449 Diaphragmatic hernia without obstruction or gangrene: Secondary | ICD-10-CM | POA: Diagnosis not present

## 2014-10-11 DIAGNOSIS — Z7982 Long term (current) use of aspirin: Secondary | ICD-10-CM | POA: Diagnosis not present

## 2014-10-12 DIAGNOSIS — I482 Chronic atrial fibrillation: Secondary | ICD-10-CM | POA: Diagnosis not present

## 2014-10-12 DIAGNOSIS — I83812 Varicose veins of left lower extremities with pain: Secondary | ICD-10-CM | POA: Diagnosis not present

## 2014-10-12 DIAGNOSIS — I10 Essential (primary) hypertension: Secondary | ICD-10-CM | POA: Diagnosis not present

## 2014-10-12 DIAGNOSIS — J441 Chronic obstructive pulmonary disease with (acute) exacerbation: Secondary | ICD-10-CM | POA: Diagnosis not present

## 2014-10-12 DIAGNOSIS — I871 Compression of vein: Secondary | ICD-10-CM | POA: Diagnosis not present

## 2014-10-12 DIAGNOSIS — I5042 Chronic combined systolic (congestive) and diastolic (congestive) heart failure: Secondary | ICD-10-CM | POA: Diagnosis not present

## 2014-11-02 DIAGNOSIS — I10 Essential (primary) hypertension: Secondary | ICD-10-CM | POA: Diagnosis not present

## 2014-11-02 DIAGNOSIS — Z7982 Long term (current) use of aspirin: Secondary | ICD-10-CM | POA: Diagnosis not present

## 2014-11-02 DIAGNOSIS — Z79899 Other long term (current) drug therapy: Secondary | ICD-10-CM | POA: Diagnosis not present

## 2014-11-02 DIAGNOSIS — F329 Major depressive disorder, single episode, unspecified: Secondary | ICD-10-CM | POA: Diagnosis not present

## 2014-11-02 DIAGNOSIS — E119 Type 2 diabetes mellitus without complications: Secondary | ICD-10-CM | POA: Diagnosis not present

## 2014-11-02 DIAGNOSIS — Z87442 Personal history of urinary calculi: Secondary | ICD-10-CM | POA: Diagnosis not present

## 2014-11-02 DIAGNOSIS — Z7901 Long term (current) use of anticoagulants: Secondary | ICD-10-CM | POA: Diagnosis not present

## 2014-11-02 DIAGNOSIS — I4891 Unspecified atrial fibrillation: Secondary | ICD-10-CM | POA: Diagnosis not present

## 2014-11-02 DIAGNOSIS — Z87891 Personal history of nicotine dependence: Secondary | ICD-10-CM | POA: Diagnosis not present

## 2014-11-02 DIAGNOSIS — Z8249 Family history of ischemic heart disease and other diseases of the circulatory system: Secondary | ICD-10-CM | POA: Diagnosis not present

## 2014-11-02 DIAGNOSIS — I509 Heart failure, unspecified: Secondary | ICD-10-CM | POA: Diagnosis not present

## 2014-11-02 DIAGNOSIS — Z9981 Dependence on supplemental oxygen: Secondary | ICD-10-CM | POA: Diagnosis not present

## 2014-11-02 DIAGNOSIS — N400 Enlarged prostate without lower urinary tract symptoms: Secondary | ICD-10-CM | POA: Diagnosis not present

## 2014-11-02 DIAGNOSIS — R339 Retention of urine, unspecified: Secondary | ICD-10-CM | POA: Diagnosis not present

## 2014-11-02 DIAGNOSIS — J449 Chronic obstructive pulmonary disease, unspecified: Secondary | ICD-10-CM | POA: Diagnosis not present

## 2014-11-02 DIAGNOSIS — E7800 Pure hypercholesterolemia, unspecified: Secondary | ICD-10-CM | POA: Diagnosis not present

## 2014-11-04 DIAGNOSIS — Z01810 Encounter for preprocedural cardiovascular examination: Secondary | ICD-10-CM | POA: Diagnosis not present

## 2014-11-04 DIAGNOSIS — Z01818 Encounter for other preprocedural examination: Secondary | ICD-10-CM | POA: Diagnosis not present

## 2014-11-04 DIAGNOSIS — I480 Paroxysmal atrial fibrillation: Secondary | ICD-10-CM | POA: Diagnosis not present

## 2014-11-04 DIAGNOSIS — J449 Chronic obstructive pulmonary disease, unspecified: Secondary | ICD-10-CM | POA: Diagnosis not present

## 2014-11-11 DIAGNOSIS — Z79899 Other long term (current) drug therapy: Secondary | ICD-10-CM | POA: Diagnosis not present

## 2014-11-11 DIAGNOSIS — Z7901 Long term (current) use of anticoagulants: Secondary | ICD-10-CM | POA: Diagnosis not present

## 2014-11-11 DIAGNOSIS — Z87442 Personal history of urinary calculi: Secondary | ICD-10-CM | POA: Diagnosis not present

## 2014-11-11 DIAGNOSIS — E119 Type 2 diabetes mellitus without complications: Secondary | ICD-10-CM | POA: Diagnosis not present

## 2014-11-11 DIAGNOSIS — Z7982 Long term (current) use of aspirin: Secondary | ICD-10-CM | POA: Diagnosis not present

## 2014-11-11 DIAGNOSIS — Z801 Family history of malignant neoplasm of trachea, bronchus and lung: Secondary | ICD-10-CM | POA: Diagnosis not present

## 2014-11-11 DIAGNOSIS — Z6835 Body mass index (BMI) 35.0-35.9, adult: Secondary | ICD-10-CM | POA: Diagnosis not present

## 2014-11-11 DIAGNOSIS — N368 Other specified disorders of urethra: Secondary | ICD-10-CM | POA: Diagnosis not present

## 2014-11-11 DIAGNOSIS — I10 Essential (primary) hypertension: Secondary | ICD-10-CM | POA: Diagnosis not present

## 2014-11-11 DIAGNOSIS — Z87891 Personal history of nicotine dependence: Secondary | ICD-10-CM | POA: Diagnosis not present

## 2014-11-11 DIAGNOSIS — Z7951 Long term (current) use of inhaled steroids: Secondary | ICD-10-CM | POA: Diagnosis not present

## 2014-11-11 DIAGNOSIS — N139 Obstructive and reflux uropathy, unspecified: Secondary | ICD-10-CM | POA: Diagnosis not present

## 2014-11-11 DIAGNOSIS — Z833 Family history of diabetes mellitus: Secondary | ICD-10-CM | POA: Diagnosis not present

## 2014-11-11 DIAGNOSIS — J449 Chronic obstructive pulmonary disease, unspecified: Secondary | ICD-10-CM | POA: Diagnosis not present

## 2014-11-11 DIAGNOSIS — I2510 Atherosclerotic heart disease of native coronary artery without angina pectoris: Secondary | ICD-10-CM | POA: Diagnosis not present

## 2014-11-11 DIAGNOSIS — R339 Retention of urine, unspecified: Secondary | ICD-10-CM | POA: Diagnosis not present

## 2014-11-13 DIAGNOSIS — Z87891 Personal history of nicotine dependence: Secondary | ICD-10-CM | POA: Diagnosis not present

## 2014-11-13 DIAGNOSIS — E119 Type 2 diabetes mellitus without complications: Secondary | ICD-10-CM | POA: Diagnosis not present

## 2014-11-13 DIAGNOSIS — J449 Chronic obstructive pulmonary disease, unspecified: Secondary | ICD-10-CM | POA: Diagnosis not present

## 2014-11-13 DIAGNOSIS — I10 Essential (primary) hypertension: Secondary | ICD-10-CM | POA: Diagnosis not present

## 2014-11-13 DIAGNOSIS — T83031A Leakage of indwelling urethral catheter, initial encounter: Secondary | ICD-10-CM | POA: Diagnosis not present

## 2014-11-13 DIAGNOSIS — Z79899 Other long term (current) drug therapy: Secondary | ICD-10-CM | POA: Diagnosis not present

## 2014-11-13 DIAGNOSIS — Z7982 Long term (current) use of aspirin: Secondary | ICD-10-CM | POA: Diagnosis not present

## 2014-11-13 DIAGNOSIS — Z7901 Long term (current) use of anticoagulants: Secondary | ICD-10-CM | POA: Diagnosis not present

## 2014-11-13 DIAGNOSIS — T83038A Leakage of other indwelling urethral catheter, initial encounter: Secondary | ICD-10-CM | POA: Diagnosis not present

## 2014-11-13 DIAGNOSIS — Z9889 Other specified postprocedural states: Secondary | ICD-10-CM | POA: Diagnosis not present

## 2014-11-15 DIAGNOSIS — E119 Type 2 diabetes mellitus without complications: Secondary | ICD-10-CM | POA: Diagnosis not present

## 2014-11-15 DIAGNOSIS — T83198A Other mechanical complication of other urinary devices and implants, initial encounter: Secondary | ICD-10-CM | POA: Diagnosis not present

## 2014-11-15 DIAGNOSIS — T83038A Leakage of other indwelling urethral catheter, initial encounter: Secondary | ICD-10-CM | POA: Diagnosis not present

## 2014-11-15 DIAGNOSIS — Z7901 Long term (current) use of anticoagulants: Secondary | ICD-10-CM | POA: Diagnosis not present

## 2014-11-15 DIAGNOSIS — Z87891 Personal history of nicotine dependence: Secondary | ICD-10-CM | POA: Diagnosis not present

## 2014-11-15 DIAGNOSIS — T839XXA Unspecified complication of genitourinary prosthetic device, implant and graft, initial encounter: Secondary | ICD-10-CM | POA: Diagnosis not present

## 2014-11-15 DIAGNOSIS — J449 Chronic obstructive pulmonary disease, unspecified: Secondary | ICD-10-CM | POA: Diagnosis not present

## 2014-11-15 DIAGNOSIS — I10 Essential (primary) hypertension: Secondary | ICD-10-CM | POA: Diagnosis not present

## 2014-11-15 DIAGNOSIS — N400 Enlarged prostate without lower urinary tract symptoms: Secondary | ICD-10-CM | POA: Diagnosis not present

## 2014-11-15 DIAGNOSIS — Z7982 Long term (current) use of aspirin: Secondary | ICD-10-CM | POA: Diagnosis not present

## 2014-11-15 DIAGNOSIS — Z79899 Other long term (current) drug therapy: Secondary | ICD-10-CM | POA: Diagnosis not present

## 2014-11-15 DIAGNOSIS — T83498A Other mechanical complication of other prosthetic devices, implants and grafts of genital tract, initial encounter: Secondary | ICD-10-CM | POA: Diagnosis not present

## 2014-12-06 DIAGNOSIS — Z23 Encounter for immunization: Secondary | ICD-10-CM | POA: Diagnosis not present

## 2014-12-21 DIAGNOSIS — Z79899 Other long term (current) drug therapy: Secondary | ICD-10-CM | POA: Diagnosis not present

## 2014-12-21 DIAGNOSIS — I10 Essential (primary) hypertension: Secondary | ICD-10-CM | POA: Diagnosis not present

## 2014-12-21 DIAGNOSIS — R05 Cough: Secondary | ICD-10-CM | POA: Diagnosis not present

## 2014-12-21 DIAGNOSIS — R0981 Nasal congestion: Secondary | ICD-10-CM | POA: Diagnosis not present

## 2014-12-21 DIAGNOSIS — I2510 Atherosclerotic heart disease of native coronary artery without angina pectoris: Secondary | ICD-10-CM | POA: Diagnosis not present

## 2014-12-21 DIAGNOSIS — J449 Chronic obstructive pulmonary disease, unspecified: Secondary | ICD-10-CM | POA: Diagnosis not present

## 2014-12-21 DIAGNOSIS — Z7984 Long term (current) use of oral hypoglycemic drugs: Secondary | ICD-10-CM | POA: Diagnosis not present

## 2014-12-21 DIAGNOSIS — Z7901 Long term (current) use of anticoagulants: Secondary | ICD-10-CM | POA: Diagnosis not present

## 2014-12-21 DIAGNOSIS — Z7982 Long term (current) use of aspirin: Secondary | ICD-10-CM | POA: Diagnosis not present

## 2014-12-21 DIAGNOSIS — Z87891 Personal history of nicotine dependence: Secondary | ICD-10-CM | POA: Diagnosis not present

## 2014-12-21 DIAGNOSIS — E119 Type 2 diabetes mellitus without complications: Secondary | ICD-10-CM | POA: Diagnosis not present

## 2015-01-01 DIAGNOSIS — I10 Essential (primary) hypertension: Secondary | ICD-10-CM | POA: Diagnosis not present

## 2015-01-01 DIAGNOSIS — F329 Major depressive disorder, single episode, unspecified: Secondary | ICD-10-CM | POA: Diagnosis not present

## 2015-01-01 DIAGNOSIS — Z87891 Personal history of nicotine dependence: Secondary | ICD-10-CM | POA: Diagnosis not present

## 2015-01-01 DIAGNOSIS — I4891 Unspecified atrial fibrillation: Secondary | ICD-10-CM | POA: Diagnosis not present

## 2015-01-01 DIAGNOSIS — E875 Hyperkalemia: Secondary | ICD-10-CM | POA: Diagnosis not present

## 2015-01-01 DIAGNOSIS — R0602 Shortness of breath: Secondary | ICD-10-CM | POA: Diagnosis not present

## 2015-01-01 DIAGNOSIS — N400 Enlarged prostate without lower urinary tract symptoms: Secondary | ICD-10-CM | POA: Diagnosis not present

## 2015-01-01 DIAGNOSIS — Z7982 Long term (current) use of aspirin: Secondary | ICD-10-CM | POA: Diagnosis not present

## 2015-01-01 DIAGNOSIS — R7309 Other abnormal glucose: Secondary | ICD-10-CM | POA: Diagnosis not present

## 2015-01-01 DIAGNOSIS — I2510 Atherosclerotic heart disease of native coronary artery without angina pectoris: Secondary | ICD-10-CM | POA: Diagnosis not present

## 2015-01-01 DIAGNOSIS — J449 Chronic obstructive pulmonary disease, unspecified: Secondary | ICD-10-CM | POA: Diagnosis not present

## 2015-01-01 DIAGNOSIS — E1165 Type 2 diabetes mellitus with hyperglycemia: Secondary | ICD-10-CM | POA: Diagnosis not present

## 2015-01-01 DIAGNOSIS — Z7901 Long term (current) use of anticoagulants: Secondary | ICD-10-CM | POA: Diagnosis not present

## 2015-01-01 DIAGNOSIS — R7989 Other specified abnormal findings of blood chemistry: Secondary | ICD-10-CM | POA: Diagnosis not present

## 2015-01-01 DIAGNOSIS — R739 Hyperglycemia, unspecified: Secondary | ICD-10-CM | POA: Diagnosis not present

## 2015-01-01 DIAGNOSIS — Z9981 Dependence on supplemental oxygen: Secondary | ICD-10-CM | POA: Diagnosis not present

## 2015-01-01 DIAGNOSIS — Z79899 Other long term (current) drug therapy: Secondary | ICD-10-CM | POA: Diagnosis not present

## 2015-01-01 DIAGNOSIS — I509 Heart failure, unspecified: Secondary | ICD-10-CM | POA: Diagnosis not present

## 2015-01-01 DIAGNOSIS — Z885 Allergy status to narcotic agent status: Secondary | ICD-10-CM | POA: Diagnosis not present

## 2015-01-02 DIAGNOSIS — K449 Diaphragmatic hernia without obstruction or gangrene: Secondary | ICD-10-CM | POA: Diagnosis not present

## 2015-01-02 DIAGNOSIS — Z7982 Long term (current) use of aspirin: Secondary | ICD-10-CM | POA: Diagnosis not present

## 2015-01-02 DIAGNOSIS — E1165 Type 2 diabetes mellitus with hyperglycemia: Secondary | ICD-10-CM | POA: Diagnosis not present

## 2015-01-02 DIAGNOSIS — R739 Hyperglycemia, unspecified: Secondary | ICD-10-CM | POA: Diagnosis not present

## 2015-01-02 DIAGNOSIS — Z7951 Long term (current) use of inhaled steroids: Secondary | ICD-10-CM | POA: Diagnosis not present

## 2015-01-02 DIAGNOSIS — I4891 Unspecified atrial fibrillation: Secondary | ICD-10-CM | POA: Diagnosis not present

## 2015-01-02 DIAGNOSIS — J209 Acute bronchitis, unspecified: Secondary | ICD-10-CM | POA: Diagnosis not present

## 2015-01-02 DIAGNOSIS — J208 Acute bronchitis due to other specified organisms: Secondary | ICD-10-CM | POA: Diagnosis not present

## 2015-01-02 DIAGNOSIS — I5042 Chronic combined systolic (congestive) and diastolic (congestive) heart failure: Secondary | ICD-10-CM | POA: Diagnosis not present

## 2015-01-02 DIAGNOSIS — Z885 Allergy status to narcotic agent status: Secondary | ICD-10-CM | POA: Diagnosis not present

## 2015-01-02 DIAGNOSIS — Z8249 Family history of ischemic heart disease and other diseases of the circulatory system: Secondary | ICD-10-CM | POA: Diagnosis not present

## 2015-01-02 DIAGNOSIS — R7309 Other abnormal glucose: Secondary | ICD-10-CM | POA: Diagnosis not present

## 2015-01-02 DIAGNOSIS — Z7984 Long term (current) use of oral hypoglycemic drugs: Secondary | ICD-10-CM | POA: Diagnosis not present

## 2015-01-02 DIAGNOSIS — I482 Chronic atrial fibrillation: Secondary | ICD-10-CM | POA: Diagnosis not present

## 2015-01-02 DIAGNOSIS — Z79899 Other long term (current) drug therapy: Secondary | ICD-10-CM | POA: Diagnosis not present

## 2015-01-02 DIAGNOSIS — J440 Chronic obstructive pulmonary disease with acute lower respiratory infection: Secondary | ICD-10-CM | POA: Diagnosis not present

## 2015-01-02 DIAGNOSIS — R05 Cough: Secondary | ICD-10-CM | POA: Diagnosis not present

## 2015-01-02 DIAGNOSIS — I110 Hypertensive heart disease with heart failure: Secondary | ICD-10-CM | POA: Diagnosis not present

## 2015-01-03 DIAGNOSIS — J208 Acute bronchitis due to other specified organisms: Secondary | ICD-10-CM | POA: Diagnosis not present

## 2015-01-03 DIAGNOSIS — I482 Chronic atrial fibrillation: Secondary | ICD-10-CM | POA: Diagnosis not present

## 2015-01-03 DIAGNOSIS — I110 Hypertensive heart disease with heart failure: Secondary | ICD-10-CM | POA: Diagnosis not present

## 2015-01-03 DIAGNOSIS — E1165 Type 2 diabetes mellitus with hyperglycemia: Secondary | ICD-10-CM | POA: Diagnosis not present

## 2015-01-03 DIAGNOSIS — J440 Chronic obstructive pulmonary disease with acute lower respiratory infection: Secondary | ICD-10-CM | POA: Diagnosis not present

## 2015-01-04 DIAGNOSIS — I482 Chronic atrial fibrillation: Secondary | ICD-10-CM | POA: Diagnosis not present

## 2015-01-04 DIAGNOSIS — I110 Hypertensive heart disease with heart failure: Secondary | ICD-10-CM | POA: Diagnosis not present

## 2015-01-04 DIAGNOSIS — E1165 Type 2 diabetes mellitus with hyperglycemia: Secondary | ICD-10-CM | POA: Diagnosis not present

## 2015-01-04 DIAGNOSIS — J208 Acute bronchitis due to other specified organisms: Secondary | ICD-10-CM | POA: Diagnosis not present

## 2015-01-04 DIAGNOSIS — J440 Chronic obstructive pulmonary disease with acute lower respiratory infection: Secondary | ICD-10-CM | POA: Diagnosis not present

## 2015-01-06 DIAGNOSIS — I10 Essential (primary) hypertension: Secondary | ICD-10-CM | POA: Diagnosis not present

## 2015-01-06 DIAGNOSIS — Z79899 Other long term (current) drug therapy: Secondary | ICD-10-CM | POA: Diagnosis not present

## 2015-01-06 DIAGNOSIS — E1165 Type 2 diabetes mellitus with hyperglycemia: Secondary | ICD-10-CM | POA: Diagnosis not present

## 2015-01-06 DIAGNOSIS — J440 Chronic obstructive pulmonary disease with acute lower respiratory infection: Secondary | ICD-10-CM | POA: Diagnosis not present

## 2015-01-21 DIAGNOSIS — Z794 Long term (current) use of insulin: Secondary | ICD-10-CM | POA: Diagnosis not present

## 2015-01-21 DIAGNOSIS — J018 Other acute sinusitis: Secondary | ICD-10-CM | POA: Diagnosis not present

## 2015-01-21 DIAGNOSIS — R0602 Shortness of breath: Secondary | ICD-10-CM | POA: Diagnosis not present

## 2015-01-21 DIAGNOSIS — N400 Enlarged prostate without lower urinary tract symptoms: Secondary | ICD-10-CM | POA: Diagnosis not present

## 2015-01-21 DIAGNOSIS — R05 Cough: Secondary | ICD-10-CM | POA: Diagnosis not present

## 2015-01-21 DIAGNOSIS — Z79899 Other long term (current) drug therapy: Secondary | ICD-10-CM | POA: Diagnosis not present

## 2015-01-21 DIAGNOSIS — F329 Major depressive disorder, single episode, unspecified: Secondary | ICD-10-CM | POA: Diagnosis not present

## 2015-01-21 DIAGNOSIS — I10 Essential (primary) hypertension: Secondary | ICD-10-CM | POA: Diagnosis not present

## 2015-01-21 DIAGNOSIS — Z87891 Personal history of nicotine dependence: Secondary | ICD-10-CM | POA: Diagnosis not present

## 2015-01-21 DIAGNOSIS — J329 Chronic sinusitis, unspecified: Secondary | ICD-10-CM | POA: Diagnosis not present

## 2015-01-21 DIAGNOSIS — Z7982 Long term (current) use of aspirin: Secondary | ICD-10-CM | POA: Diagnosis not present

## 2015-01-21 DIAGNOSIS — E7800 Pure hypercholesterolemia, unspecified: Secondary | ICD-10-CM | POA: Diagnosis not present

## 2015-01-21 DIAGNOSIS — E119 Type 2 diabetes mellitus without complications: Secondary | ICD-10-CM | POA: Diagnosis not present

## 2015-01-29 DIAGNOSIS — I10 Essential (primary) hypertension: Secondary | ICD-10-CM | POA: Diagnosis not present

## 2015-01-29 DIAGNOSIS — J310 Chronic rhinitis: Secondary | ICD-10-CM | POA: Diagnosis not present

## 2015-01-29 DIAGNOSIS — Z7982 Long term (current) use of aspirin: Secondary | ICD-10-CM | POA: Diagnosis not present

## 2015-01-29 DIAGNOSIS — I509 Heart failure, unspecified: Secondary | ICD-10-CM | POA: Diagnosis not present

## 2015-01-29 DIAGNOSIS — I2510 Atherosclerotic heart disease of native coronary artery without angina pectoris: Secondary | ICD-10-CM | POA: Diagnosis not present

## 2015-01-29 DIAGNOSIS — R0602 Shortness of breath: Secondary | ICD-10-CM | POA: Diagnosis not present

## 2015-01-29 DIAGNOSIS — Z885 Allergy status to narcotic agent status: Secondary | ICD-10-CM | POA: Diagnosis not present

## 2015-01-29 DIAGNOSIS — J320 Chronic maxillary sinusitis: Secondary | ICD-10-CM | POA: Diagnosis not present

## 2015-01-29 DIAGNOSIS — Z79899 Other long term (current) drug therapy: Secondary | ICD-10-CM | POA: Diagnosis not present

## 2015-01-29 DIAGNOSIS — R0902 Hypoxemia: Secondary | ICD-10-CM | POA: Diagnosis not present

## 2015-01-29 DIAGNOSIS — E119 Type 2 diabetes mellitus without complications: Secondary | ICD-10-CM | POA: Diagnosis not present

## 2015-01-29 DIAGNOSIS — I4891 Unspecified atrial fibrillation: Secondary | ICD-10-CM | POA: Diagnosis not present

## 2015-01-29 DIAGNOSIS — J0100 Acute maxillary sinusitis, unspecified: Secondary | ICD-10-CM | POA: Diagnosis not present

## 2015-01-29 DIAGNOSIS — R069 Unspecified abnormalities of breathing: Secondary | ICD-10-CM | POA: Diagnosis not present

## 2015-01-29 DIAGNOSIS — J339 Nasal polyp, unspecified: Secondary | ICD-10-CM | POA: Diagnosis not present

## 2015-01-29 DIAGNOSIS — J441 Chronic obstructive pulmonary disease with (acute) exacerbation: Secondary | ICD-10-CM | POA: Diagnosis not present

## 2015-01-29 DIAGNOSIS — Z7901 Long term (current) use of anticoagulants: Secondary | ICD-10-CM | POA: Diagnosis not present

## 2015-01-29 DIAGNOSIS — R739 Hyperglycemia, unspecified: Secondary | ICD-10-CM | POA: Diagnosis not present

## 2015-01-29 DIAGNOSIS — Z794 Long term (current) use of insulin: Secondary | ICD-10-CM | POA: Diagnosis not present

## 2015-01-29 DIAGNOSIS — Z9981 Dependence on supplemental oxygen: Secondary | ICD-10-CM | POA: Diagnosis not present

## 2015-01-29 DIAGNOSIS — N400 Enlarged prostate without lower urinary tract symptoms: Secondary | ICD-10-CM | POA: Diagnosis not present

## 2015-01-29 DIAGNOSIS — J00 Acute nasopharyngitis [common cold]: Secondary | ICD-10-CM | POA: Diagnosis not present

## 2015-04-06 DIAGNOSIS — R0602 Shortness of breath: Secondary | ICD-10-CM | POA: Diagnosis not present

## 2015-04-06 DIAGNOSIS — F17201 Nicotine dependence, unspecified, in remission: Secondary | ICD-10-CM | POA: Diagnosis not present

## 2015-04-06 DIAGNOSIS — Z794 Long term (current) use of insulin: Secondary | ICD-10-CM | POA: Diagnosis not present

## 2015-04-06 DIAGNOSIS — K5900 Constipation, unspecified: Secondary | ICD-10-CM | POA: Diagnosis not present

## 2015-04-06 DIAGNOSIS — Z79899 Other long term (current) drug therapy: Secondary | ICD-10-CM | POA: Diagnosis not present

## 2015-04-06 DIAGNOSIS — J449 Chronic obstructive pulmonary disease, unspecified: Secondary | ICD-10-CM | POA: Diagnosis not present

## 2015-04-06 DIAGNOSIS — J441 Chronic obstructive pulmonary disease with (acute) exacerbation: Secondary | ICD-10-CM | POA: Diagnosis not present

## 2015-04-06 DIAGNOSIS — I4891 Unspecified atrial fibrillation: Secondary | ICD-10-CM | POA: Diagnosis not present

## 2015-04-06 DIAGNOSIS — R069 Unspecified abnormalities of breathing: Secondary | ICD-10-CM | POA: Diagnosis not present

## 2015-04-06 DIAGNOSIS — Z87891 Personal history of nicotine dependence: Secondary | ICD-10-CM | POA: Diagnosis not present

## 2015-04-06 DIAGNOSIS — Z7982 Long term (current) use of aspirin: Secondary | ICD-10-CM | POA: Diagnosis not present

## 2015-04-06 DIAGNOSIS — K5660 Unspecified intestinal obstruction: Secondary | ICD-10-CM | POA: Diagnosis not present

## 2015-04-08 DIAGNOSIS — R1084 Generalized abdominal pain: Secondary | ICD-10-CM | POA: Diagnosis not present

## 2015-04-08 DIAGNOSIS — Z79899 Other long term (current) drug therapy: Secondary | ICD-10-CM | POA: Diagnosis not present

## 2015-04-08 DIAGNOSIS — K5900 Constipation, unspecified: Secondary | ICD-10-CM | POA: Diagnosis not present

## 2015-04-08 DIAGNOSIS — R0602 Shortness of breath: Secondary | ICD-10-CM | POA: Diagnosis not present

## 2015-04-08 DIAGNOSIS — Z87891 Personal history of nicotine dependence: Secondary | ICD-10-CM | POA: Diagnosis not present

## 2015-04-08 DIAGNOSIS — J449 Chronic obstructive pulmonary disease, unspecified: Secondary | ICD-10-CM | POA: Diagnosis not present

## 2015-04-08 DIAGNOSIS — R069 Unspecified abnormalities of breathing: Secondary | ICD-10-CM | POA: Diagnosis not present

## 2015-04-08 DIAGNOSIS — Z7984 Long term (current) use of oral hypoglycemic drugs: Secondary | ICD-10-CM | POA: Diagnosis not present

## 2015-04-08 DIAGNOSIS — Z7901 Long term (current) use of anticoagulants: Secondary | ICD-10-CM | POA: Diagnosis not present

## 2015-04-08 DIAGNOSIS — E119 Type 2 diabetes mellitus without complications: Secondary | ICD-10-CM | POA: Diagnosis not present

## 2015-04-08 DIAGNOSIS — F329 Major depressive disorder, single episode, unspecified: Secondary | ICD-10-CM | POA: Diagnosis not present

## 2015-04-08 DIAGNOSIS — Z794 Long term (current) use of insulin: Secondary | ICD-10-CM | POA: Diagnosis not present

## 2015-04-08 DIAGNOSIS — Z7982 Long term (current) use of aspirin: Secondary | ICD-10-CM | POA: Diagnosis not present

## 2015-04-08 DIAGNOSIS — Z9111 Patient's noncompliance with dietary regimen: Secondary | ICD-10-CM | POA: Diagnosis not present

## 2015-04-08 DIAGNOSIS — E118 Type 2 diabetes mellitus with unspecified complications: Secondary | ICD-10-CM | POA: Diagnosis not present

## 2015-04-08 DIAGNOSIS — I10 Essential (primary) hypertension: Secondary | ICD-10-CM | POA: Diagnosis not present

## 2015-04-08 DIAGNOSIS — K5901 Slow transit constipation: Secondary | ICD-10-CM | POA: Diagnosis not present

## 2015-04-08 DIAGNOSIS — Z9114 Patient's other noncompliance with medication regimen: Secondary | ICD-10-CM | POA: Diagnosis not present

## 2015-04-08 DIAGNOSIS — I4891 Unspecified atrial fibrillation: Secondary | ICD-10-CM | POA: Diagnosis not present

## 2015-04-08 DIAGNOSIS — Z7952 Long term (current) use of systemic steroids: Secondary | ICD-10-CM | POA: Diagnosis not present

## 2015-04-08 DIAGNOSIS — Z8249 Family history of ischemic heart disease and other diseases of the circulatory system: Secondary | ICD-10-CM | POA: Diagnosis not present

## 2015-04-10 DIAGNOSIS — Z79899 Other long term (current) drug therapy: Secondary | ICD-10-CM | POA: Diagnosis not present

## 2015-04-10 DIAGNOSIS — I2510 Atherosclerotic heart disease of native coronary artery without angina pectoris: Secondary | ICD-10-CM | POA: Diagnosis not present

## 2015-04-10 DIAGNOSIS — E669 Obesity, unspecified: Secondary | ICD-10-CM | POA: Diagnosis not present

## 2015-04-10 DIAGNOSIS — E119 Type 2 diabetes mellitus without complications: Secondary | ICD-10-CM | POA: Diagnosis not present

## 2015-04-10 DIAGNOSIS — I509 Heart failure, unspecified: Secondary | ICD-10-CM | POA: Diagnosis not present

## 2015-04-10 DIAGNOSIS — J449 Chronic obstructive pulmonary disease, unspecified: Secondary | ICD-10-CM | POA: Diagnosis not present

## 2015-04-10 DIAGNOSIS — I110 Hypertensive heart disease with heart failure: Secondary | ICD-10-CM | POA: Diagnosis not present

## 2015-04-10 DIAGNOSIS — N400 Enlarged prostate without lower urinary tract symptoms: Secondary | ICD-10-CM | POA: Diagnosis not present

## 2015-04-10 DIAGNOSIS — Z9981 Dependence on supplemental oxygen: Secondary | ICD-10-CM | POA: Diagnosis not present

## 2015-04-10 DIAGNOSIS — I4891 Unspecified atrial fibrillation: Secondary | ICD-10-CM | POA: Diagnosis not present

## 2015-04-10 DIAGNOSIS — Z7901 Long term (current) use of anticoagulants: Secondary | ICD-10-CM | POA: Diagnosis not present

## 2015-04-10 DIAGNOSIS — F17200 Nicotine dependence, unspecified, uncomplicated: Secondary | ICD-10-CM | POA: Diagnosis not present

## 2015-04-10 DIAGNOSIS — G4700 Insomnia, unspecified: Secondary | ICD-10-CM | POA: Diagnosis not present

## 2015-04-10 DIAGNOSIS — Z7982 Long term (current) use of aspirin: Secondary | ICD-10-CM | POA: Diagnosis not present

## 2015-04-10 DIAGNOSIS — Z794 Long term (current) use of insulin: Secondary | ICD-10-CM | POA: Diagnosis not present

## 2015-04-12 DIAGNOSIS — K5909 Other constipation: Secondary | ICD-10-CM | POA: Diagnosis not present

## 2015-04-12 DIAGNOSIS — F5101 Primary insomnia: Secondary | ICD-10-CM | POA: Diagnosis not present

## 2015-04-12 DIAGNOSIS — E1165 Type 2 diabetes mellitus with hyperglycemia: Secondary | ICD-10-CM | POA: Diagnosis not present

## 2015-04-12 DIAGNOSIS — J440 Chronic obstructive pulmonary disease with acute lower respiratory infection: Secondary | ICD-10-CM | POA: Diagnosis not present

## 2015-04-12 DIAGNOSIS — Z1159 Encounter for screening for other viral diseases: Secondary | ICD-10-CM | POA: Diagnosis not present

## 2015-05-22 DIAGNOSIS — R0602 Shortness of breath: Secondary | ICD-10-CM | POA: Diagnosis not present

## 2015-05-22 DIAGNOSIS — I482 Chronic atrial fibrillation: Secondary | ICD-10-CM | POA: Diagnosis not present

## 2015-05-22 DIAGNOSIS — E1165 Type 2 diabetes mellitus with hyperglycemia: Secondary | ICD-10-CM | POA: Diagnosis not present

## 2015-05-22 DIAGNOSIS — J441 Chronic obstructive pulmonary disease with (acute) exacerbation: Secondary | ICD-10-CM | POA: Diagnosis not present

## 2015-05-22 DIAGNOSIS — R05 Cough: Secondary | ICD-10-CM | POA: Diagnosis not present

## 2015-05-22 DIAGNOSIS — I10 Essential (primary) hypertension: Secondary | ICD-10-CM | POA: Diagnosis not present

## 2015-05-22 DIAGNOSIS — I5042 Chronic combined systolic (congestive) and diastolic (congestive) heart failure: Secondary | ICD-10-CM | POA: Diagnosis not present

## 2015-05-22 DIAGNOSIS — K449 Diaphragmatic hernia without obstruction or gangrene: Secondary | ICD-10-CM | POA: Diagnosis not present

## 2015-05-22 DIAGNOSIS — Z885 Allergy status to narcotic agent status: Secondary | ICD-10-CM | POA: Diagnosis not present

## 2015-05-22 DIAGNOSIS — R0902 Hypoxemia: Secondary | ICD-10-CM | POA: Diagnosis not present

## 2015-05-22 DIAGNOSIS — Z87891 Personal history of nicotine dependence: Secondary | ICD-10-CM | POA: Diagnosis not present

## 2015-05-22 DIAGNOSIS — Z7984 Long term (current) use of oral hypoglycemic drugs: Secondary | ICD-10-CM | POA: Diagnosis not present

## 2015-05-22 DIAGNOSIS — Z7982 Long term (current) use of aspirin: Secondary | ICD-10-CM | POA: Diagnosis not present

## 2015-05-22 DIAGNOSIS — Z79899 Other long term (current) drug therapy: Secondary | ICD-10-CM | POA: Diagnosis not present

## 2015-05-22 DIAGNOSIS — I110 Hypertensive heart disease with heart failure: Secondary | ICD-10-CM | POA: Diagnosis not present

## 2015-05-23 DIAGNOSIS — I5042 Chronic combined systolic (congestive) and diastolic (congestive) heart failure: Secondary | ICD-10-CM | POA: Diagnosis not present

## 2015-05-23 DIAGNOSIS — I110 Hypertensive heart disease with heart failure: Secondary | ICD-10-CM | POA: Diagnosis not present

## 2015-05-23 DIAGNOSIS — R0902 Hypoxemia: Secondary | ICD-10-CM | POA: Diagnosis not present

## 2015-05-23 DIAGNOSIS — I482 Chronic atrial fibrillation: Secondary | ICD-10-CM | POA: Diagnosis not present

## 2015-05-23 DIAGNOSIS — J441 Chronic obstructive pulmonary disease with (acute) exacerbation: Secondary | ICD-10-CM | POA: Diagnosis not present

## 2015-05-30 DIAGNOSIS — E119 Type 2 diabetes mellitus without complications: Secondary | ICD-10-CM | POA: Diagnosis not present

## 2015-05-30 DIAGNOSIS — N400 Enlarged prostate without lower urinary tract symptoms: Secondary | ICD-10-CM | POA: Diagnosis not present

## 2015-05-30 DIAGNOSIS — Z79899 Other long term (current) drug therapy: Secondary | ICD-10-CM | POA: Diagnosis not present

## 2015-05-30 DIAGNOSIS — R55 Syncope and collapse: Secondary | ICD-10-CM | POA: Diagnosis not present

## 2015-05-30 DIAGNOSIS — J449 Chronic obstructive pulmonary disease, unspecified: Secondary | ICD-10-CM | POA: Diagnosis not present

## 2015-05-30 DIAGNOSIS — Z794 Long term (current) use of insulin: Secondary | ICD-10-CM | POA: Diagnosis not present

## 2015-05-30 DIAGNOSIS — I2510 Atherosclerotic heart disease of native coronary artery without angina pectoris: Secondary | ICD-10-CM | POA: Diagnosis not present

## 2015-05-30 DIAGNOSIS — Z7982 Long term (current) use of aspirin: Secondary | ICD-10-CM | POA: Diagnosis not present

## 2015-05-30 DIAGNOSIS — Z87891 Personal history of nicotine dependence: Secondary | ICD-10-CM | POA: Diagnosis not present

## 2015-05-30 DIAGNOSIS — I110 Hypertensive heart disease with heart failure: Secondary | ICD-10-CM | POA: Diagnosis not present

## 2015-05-30 DIAGNOSIS — Z7901 Long term (current) use of anticoagulants: Secondary | ICD-10-CM | POA: Diagnosis not present

## 2015-05-30 DIAGNOSIS — I509 Heart failure, unspecified: Secondary | ICD-10-CM | POA: Diagnosis not present

## 2015-06-11 DIAGNOSIS — Z8249 Family history of ischemic heart disease and other diseases of the circulatory system: Secondary | ICD-10-CM | POA: Diagnosis not present

## 2015-06-11 DIAGNOSIS — Z825 Family history of asthma and other chronic lower respiratory diseases: Secondary | ICD-10-CM | POA: Diagnosis not present

## 2015-06-11 DIAGNOSIS — Z801 Family history of malignant neoplasm of trachea, bronchus and lung: Secondary | ICD-10-CM | POA: Diagnosis not present

## 2015-06-11 DIAGNOSIS — J181 Lobar pneumonia, unspecified organism: Secondary | ICD-10-CM | POA: Diagnosis not present

## 2015-06-11 DIAGNOSIS — Z794 Long term (current) use of insulin: Secondary | ICD-10-CM | POA: Diagnosis not present

## 2015-06-11 DIAGNOSIS — J440 Chronic obstructive pulmonary disease with acute lower respiratory infection: Secondary | ICD-10-CM | POA: Diagnosis not present

## 2015-06-11 DIAGNOSIS — I5042 Chronic combined systolic (congestive) and diastolic (congestive) heart failure: Secondary | ICD-10-CM | POA: Diagnosis not present

## 2015-06-11 DIAGNOSIS — Z87891 Personal history of nicotine dependence: Secondary | ICD-10-CM | POA: Diagnosis not present

## 2015-06-11 DIAGNOSIS — E119 Type 2 diabetes mellitus without complications: Secondary | ICD-10-CM | POA: Diagnosis not present

## 2015-06-11 DIAGNOSIS — J9601 Acute respiratory failure with hypoxia: Secondary | ICD-10-CM | POA: Diagnosis not present

## 2015-06-11 DIAGNOSIS — R0602 Shortness of breath: Secondary | ICD-10-CM | POA: Diagnosis not present

## 2015-06-11 DIAGNOSIS — Z886 Allergy status to analgesic agent status: Secondary | ICD-10-CM | POA: Diagnosis not present

## 2015-06-11 DIAGNOSIS — J189 Pneumonia, unspecified organism: Secondary | ICD-10-CM | POA: Diagnosis not present

## 2015-06-11 DIAGNOSIS — J168 Pneumonia due to other specified infectious organisms: Secondary | ICD-10-CM | POA: Diagnosis not present

## 2015-06-11 DIAGNOSIS — Z79899 Other long term (current) drug therapy: Secondary | ICD-10-CM | POA: Diagnosis not present

## 2015-06-11 DIAGNOSIS — Z7901 Long term (current) use of anticoagulants: Secondary | ICD-10-CM | POA: Diagnosis not present

## 2015-06-11 DIAGNOSIS — I110 Hypertensive heart disease with heart failure: Secondary | ICD-10-CM | POA: Diagnosis present

## 2015-06-11 DIAGNOSIS — K449 Diaphragmatic hernia without obstruction or gangrene: Secondary | ICD-10-CM | POA: Diagnosis present

## 2015-06-11 DIAGNOSIS — I482 Chronic atrial fibrillation: Secondary | ICD-10-CM | POA: Diagnosis not present

## 2015-06-11 DIAGNOSIS — Z7982 Long term (current) use of aspirin: Secondary | ICD-10-CM | POA: Diagnosis not present

## 2015-06-17 DIAGNOSIS — I2510 Atherosclerotic heart disease of native coronary artery without angina pectoris: Secondary | ICD-10-CM | POA: Diagnosis not present

## 2015-06-17 DIAGNOSIS — E119 Type 2 diabetes mellitus without complications: Secondary | ICD-10-CM | POA: Diagnosis not present

## 2015-06-17 DIAGNOSIS — R0602 Shortness of breath: Secondary | ICD-10-CM | POA: Diagnosis not present

## 2015-06-17 DIAGNOSIS — I110 Hypertensive heart disease with heart failure: Secondary | ICD-10-CM | POA: Diagnosis not present

## 2015-06-17 DIAGNOSIS — Z79899 Other long term (current) drug therapy: Secondary | ICD-10-CM | POA: Diagnosis not present

## 2015-06-17 DIAGNOSIS — Z87891 Personal history of nicotine dependence: Secondary | ICD-10-CM | POA: Diagnosis not present

## 2015-06-17 DIAGNOSIS — Z794 Long term (current) use of insulin: Secondary | ICD-10-CM | POA: Diagnosis not present

## 2015-06-17 DIAGNOSIS — Z7901 Long term (current) use of anticoagulants: Secondary | ICD-10-CM | POA: Diagnosis not present

## 2015-06-17 DIAGNOSIS — I509 Heart failure, unspecified: Secondary | ICD-10-CM | POA: Diagnosis not present

## 2015-06-17 DIAGNOSIS — N400 Enlarged prostate without lower urinary tract symptoms: Secondary | ICD-10-CM | POA: Diagnosis not present

## 2015-06-17 DIAGNOSIS — J441 Chronic obstructive pulmonary disease with (acute) exacerbation: Secondary | ICD-10-CM | POA: Diagnosis not present

## 2015-06-17 DIAGNOSIS — Z7982 Long term (current) use of aspirin: Secondary | ICD-10-CM | POA: Diagnosis not present

## 2015-06-17 DIAGNOSIS — Z8701 Personal history of pneumonia (recurrent): Secondary | ICD-10-CM | POA: Diagnosis not present

## 2015-06-23 DIAGNOSIS — J440 Chronic obstructive pulmonary disease with acute lower respiratory infection: Secondary | ICD-10-CM | POA: Diagnosis not present

## 2015-06-23 DIAGNOSIS — E860 Dehydration: Secondary | ICD-10-CM | POA: Diagnosis not present

## 2015-06-23 DIAGNOSIS — J441 Chronic obstructive pulmonary disease with (acute) exacerbation: Secondary | ICD-10-CM | POA: Diagnosis not present

## 2015-06-23 DIAGNOSIS — I509 Heart failure, unspecified: Secondary | ICD-10-CM | POA: Diagnosis not present

## 2015-06-23 DIAGNOSIS — J209 Acute bronchitis, unspecified: Secondary | ICD-10-CM | POA: Diagnosis not present

## 2015-06-23 DIAGNOSIS — Z7901 Long term (current) use of anticoagulants: Secondary | ICD-10-CM | POA: Diagnosis not present

## 2015-06-23 DIAGNOSIS — R05 Cough: Secondary | ICD-10-CM | POA: Diagnosis not present

## 2015-06-23 DIAGNOSIS — E119 Type 2 diabetes mellitus without complications: Secondary | ICD-10-CM | POA: Diagnosis not present

## 2015-06-23 DIAGNOSIS — Z7982 Long term (current) use of aspirin: Secondary | ICD-10-CM | POA: Diagnosis not present

## 2015-06-23 DIAGNOSIS — Z87891 Personal history of nicotine dependence: Secondary | ICD-10-CM | POA: Diagnosis not present

## 2015-06-23 DIAGNOSIS — Z8701 Personal history of pneumonia (recurrent): Secondary | ICD-10-CM | POA: Diagnosis not present

## 2015-06-23 DIAGNOSIS — Z7984 Long term (current) use of oral hypoglycemic drugs: Secondary | ICD-10-CM | POA: Diagnosis not present

## 2015-06-23 DIAGNOSIS — I2510 Atherosclerotic heart disease of native coronary artery without angina pectoris: Secondary | ICD-10-CM | POA: Diagnosis not present

## 2015-06-23 DIAGNOSIS — Z79899 Other long term (current) drug therapy: Secondary | ICD-10-CM | POA: Diagnosis not present

## 2015-06-23 DIAGNOSIS — I110 Hypertensive heart disease with heart failure: Secondary | ICD-10-CM | POA: Diagnosis not present

## 2015-06-28 DIAGNOSIS — J440 Chronic obstructive pulmonary disease with acute lower respiratory infection: Secondary | ICD-10-CM | POA: Diagnosis not present

## 2015-07-03 DIAGNOSIS — Z794 Long term (current) use of insulin: Secondary | ICD-10-CM | POA: Diagnosis not present

## 2015-07-03 DIAGNOSIS — Z7982 Long term (current) use of aspirin: Secondary | ICD-10-CM | POA: Diagnosis not present

## 2015-07-03 DIAGNOSIS — E7800 Pure hypercholesterolemia, unspecified: Secondary | ICD-10-CM | POA: Diagnosis not present

## 2015-07-03 DIAGNOSIS — Z79899 Other long term (current) drug therapy: Secondary | ICD-10-CM | POA: Diagnosis not present

## 2015-07-03 DIAGNOSIS — I2510 Atherosclerotic heart disease of native coronary artery without angina pectoris: Secondary | ICD-10-CM | POA: Diagnosis not present

## 2015-07-03 DIAGNOSIS — J441 Chronic obstructive pulmonary disease with (acute) exacerbation: Secondary | ICD-10-CM | POA: Diagnosis not present

## 2015-07-03 DIAGNOSIS — I110 Hypertensive heart disease with heart failure: Secondary | ICD-10-CM | POA: Diagnosis not present

## 2015-07-03 DIAGNOSIS — I509 Heart failure, unspecified: Secondary | ICD-10-CM | POA: Diagnosis not present

## 2015-07-03 DIAGNOSIS — Z7984 Long term (current) use of oral hypoglycemic drugs: Secondary | ICD-10-CM | POA: Diagnosis not present

## 2015-07-03 DIAGNOSIS — Z87891 Personal history of nicotine dependence: Secondary | ICD-10-CM | POA: Diagnosis not present

## 2015-07-03 DIAGNOSIS — E119 Type 2 diabetes mellitus without complications: Secondary | ICD-10-CM | POA: Diagnosis not present

## 2015-07-03 DIAGNOSIS — J449 Chronic obstructive pulmonary disease, unspecified: Secondary | ICD-10-CM | POA: Diagnosis not present

## 2015-07-03 DIAGNOSIS — R0602 Shortness of breath: Secondary | ICD-10-CM | POA: Diagnosis not present

## 2015-07-10 DIAGNOSIS — E119 Type 2 diabetes mellitus without complications: Secondary | ICD-10-CM | POA: Diagnosis not present

## 2015-07-10 DIAGNOSIS — I509 Heart failure, unspecified: Secondary | ICD-10-CM | POA: Diagnosis not present

## 2015-07-10 DIAGNOSIS — F329 Major depressive disorder, single episode, unspecified: Secondary | ICD-10-CM | POA: Diagnosis not present

## 2015-07-10 DIAGNOSIS — J441 Chronic obstructive pulmonary disease with (acute) exacerbation: Secondary | ICD-10-CM | POA: Diagnosis not present

## 2015-07-10 DIAGNOSIS — I2510 Atherosclerotic heart disease of native coronary artery without angina pectoris: Secondary | ICD-10-CM | POA: Diagnosis not present

## 2015-07-10 DIAGNOSIS — R0602 Shortness of breath: Secondary | ICD-10-CM | POA: Diagnosis not present

## 2015-07-10 DIAGNOSIS — E7800 Pure hypercholesterolemia, unspecified: Secondary | ICD-10-CM | POA: Diagnosis not present

## 2015-07-10 DIAGNOSIS — Z79899 Other long term (current) drug therapy: Secondary | ICD-10-CM | POA: Diagnosis not present

## 2015-07-10 DIAGNOSIS — I4891 Unspecified atrial fibrillation: Secondary | ICD-10-CM | POA: Diagnosis not present

## 2015-07-10 DIAGNOSIS — Z794 Long term (current) use of insulin: Secondary | ICD-10-CM | POA: Diagnosis not present

## 2015-07-10 DIAGNOSIS — Z7901 Long term (current) use of anticoagulants: Secondary | ICD-10-CM | POA: Diagnosis not present

## 2015-07-10 DIAGNOSIS — Z7984 Long term (current) use of oral hypoglycemic drugs: Secondary | ICD-10-CM | POA: Diagnosis not present

## 2015-07-10 DIAGNOSIS — Z7982 Long term (current) use of aspirin: Secondary | ICD-10-CM | POA: Diagnosis not present

## 2015-07-10 DIAGNOSIS — Z87891 Personal history of nicotine dependence: Secondary | ICD-10-CM | POA: Diagnosis not present

## 2015-07-10 DIAGNOSIS — I110 Hypertensive heart disease with heart failure: Secondary | ICD-10-CM | POA: Diagnosis not present

## 2015-07-10 DIAGNOSIS — J449 Chronic obstructive pulmonary disease, unspecified: Secondary | ICD-10-CM | POA: Diagnosis not present

## 2015-07-16 DIAGNOSIS — J449 Chronic obstructive pulmonary disease, unspecified: Secondary | ICD-10-CM | POA: Diagnosis not present

## 2015-07-16 DIAGNOSIS — Z87891 Personal history of nicotine dependence: Secondary | ICD-10-CM | POA: Diagnosis not present

## 2015-07-16 DIAGNOSIS — E119 Type 2 diabetes mellitus without complications: Secondary | ICD-10-CM | POA: Diagnosis not present

## 2015-07-16 DIAGNOSIS — I110 Hypertensive heart disease with heart failure: Secondary | ICD-10-CM | POA: Diagnosis not present

## 2015-07-16 DIAGNOSIS — Z79899 Other long term (current) drug therapy: Secondary | ICD-10-CM | POA: Diagnosis not present

## 2015-07-16 DIAGNOSIS — I2510 Atherosclerotic heart disease of native coronary artery without angina pectoris: Secondary | ICD-10-CM | POA: Diagnosis not present

## 2015-07-16 DIAGNOSIS — I509 Heart failure, unspecified: Secondary | ICD-10-CM | POA: Diagnosis not present

## 2015-07-16 DIAGNOSIS — Z7901 Long term (current) use of anticoagulants: Secondary | ICD-10-CM | POA: Diagnosis not present

## 2015-07-16 DIAGNOSIS — R0602 Shortness of breath: Secondary | ICD-10-CM | POA: Diagnosis not present

## 2015-07-16 DIAGNOSIS — Z794 Long term (current) use of insulin: Secondary | ICD-10-CM | POA: Diagnosis not present

## 2015-07-16 DIAGNOSIS — E7800 Pure hypercholesterolemia, unspecified: Secondary | ICD-10-CM | POA: Diagnosis not present

## 2015-07-16 DIAGNOSIS — Z9981 Dependence on supplemental oxygen: Secondary | ICD-10-CM | POA: Diagnosis not present

## 2015-07-23 DIAGNOSIS — J449 Chronic obstructive pulmonary disease, unspecified: Secondary | ICD-10-CM | POA: Diagnosis not present

## 2015-07-23 DIAGNOSIS — E11649 Type 2 diabetes mellitus with hypoglycemia without coma: Secondary | ICD-10-CM | POA: Diagnosis not present

## 2015-07-23 DIAGNOSIS — I2510 Atherosclerotic heart disease of native coronary artery without angina pectoris: Secondary | ICD-10-CM | POA: Diagnosis not present

## 2015-07-23 DIAGNOSIS — Z7901 Long term (current) use of anticoagulants: Secondary | ICD-10-CM | POA: Diagnosis not present

## 2015-07-23 DIAGNOSIS — I110 Hypertensive heart disease with heart failure: Secondary | ICD-10-CM | POA: Diagnosis not present

## 2015-07-23 DIAGNOSIS — Z794 Long term (current) use of insulin: Secondary | ICD-10-CM | POA: Diagnosis not present

## 2015-07-23 DIAGNOSIS — Z79899 Other long term (current) drug therapy: Secondary | ICD-10-CM | POA: Diagnosis not present

## 2015-07-23 DIAGNOSIS — Z87891 Personal history of nicotine dependence: Secondary | ICD-10-CM | POA: Diagnosis not present

## 2015-07-23 DIAGNOSIS — E1165 Type 2 diabetes mellitus with hyperglycemia: Secondary | ICD-10-CM | POA: Diagnosis not present

## 2015-07-23 DIAGNOSIS — J320 Chronic maxillary sinusitis: Secondary | ICD-10-CM | POA: Diagnosis not present

## 2015-07-23 DIAGNOSIS — I509 Heart failure, unspecified: Secondary | ICD-10-CM | POA: Diagnosis not present

## 2015-07-23 DIAGNOSIS — Z7952 Long term (current) use of systemic steroids: Secondary | ICD-10-CM | POA: Diagnosis not present

## 2015-07-23 DIAGNOSIS — H538 Other visual disturbances: Secondary | ICD-10-CM | POA: Diagnosis not present

## 2015-07-23 DIAGNOSIS — Z7982 Long term (current) use of aspirin: Secondary | ICD-10-CM | POA: Diagnosis not present

## 2015-07-28 ENCOUNTER — Encounter (INDEPENDENT_AMBULATORY_CARE_PROVIDER_SITE_OTHER): Payer: Self-pay | Admitting: Ophthalmology

## 2015-07-28 DIAGNOSIS — H43812 Vitreous degeneration, left eye: Secondary | ICD-10-CM | POA: Diagnosis not present

## 2015-07-28 DIAGNOSIS — H40052 Ocular hypertension, left eye: Secondary | ICD-10-CM | POA: Diagnosis not present

## 2015-07-28 DIAGNOSIS — H4312 Vitreous hemorrhage, left eye: Secondary | ICD-10-CM | POA: Diagnosis not present

## 2015-07-29 ENCOUNTER — Encounter (INDEPENDENT_AMBULATORY_CARE_PROVIDER_SITE_OTHER): Payer: Medicare Other | Admitting: Ophthalmology

## 2015-07-29 DIAGNOSIS — H43813 Vitreous degeneration, bilateral: Secondary | ICD-10-CM

## 2015-07-29 DIAGNOSIS — H4312 Vitreous hemorrhage, left eye: Secondary | ICD-10-CM | POA: Diagnosis not present

## 2015-07-29 DIAGNOSIS — I10 Essential (primary) hypertension: Secondary | ICD-10-CM

## 2015-07-29 DIAGNOSIS — H4423 Degenerative myopia, bilateral: Secondary | ICD-10-CM | POA: Diagnosis not present

## 2015-07-29 DIAGNOSIS — H35033 Hypertensive retinopathy, bilateral: Secondary | ICD-10-CM | POA: Diagnosis not present

## 2015-08-03 DIAGNOSIS — H4312 Vitreous hemorrhage, left eye: Secondary | ICD-10-CM | POA: Diagnosis not present

## 2015-08-03 DIAGNOSIS — T8522XA Displacement of intraocular lens, initial encounter: Secondary | ICD-10-CM | POA: Diagnosis not present

## 2015-08-03 DIAGNOSIS — H35373 Puckering of macula, bilateral: Secondary | ICD-10-CM | POA: Diagnosis not present

## 2015-08-03 DIAGNOSIS — E119 Type 2 diabetes mellitus without complications: Secondary | ICD-10-CM | POA: Diagnosis not present

## 2015-09-03 DIAGNOSIS — E7800 Pure hypercholesterolemia, unspecified: Secondary | ICD-10-CM | POA: Diagnosis not present

## 2015-09-03 DIAGNOSIS — Z7984 Long term (current) use of oral hypoglycemic drugs: Secondary | ICD-10-CM | POA: Diagnosis not present

## 2015-09-03 DIAGNOSIS — Z7901 Long term (current) use of anticoagulants: Secondary | ICD-10-CM | POA: Diagnosis not present

## 2015-09-03 DIAGNOSIS — I4891 Unspecified atrial fibrillation: Secondary | ICD-10-CM | POA: Diagnosis not present

## 2015-09-03 DIAGNOSIS — Z794 Long term (current) use of insulin: Secondary | ICD-10-CM | POA: Diagnosis not present

## 2015-09-03 DIAGNOSIS — R0602 Shortness of breath: Secondary | ICD-10-CM | POA: Diagnosis not present

## 2015-09-03 DIAGNOSIS — Z7982 Long term (current) use of aspirin: Secondary | ICD-10-CM | POA: Diagnosis not present

## 2015-09-03 DIAGNOSIS — J683 Other acute and subacute respiratory conditions due to chemicals, gases, fumes and vapors: Secondary | ICD-10-CM | POA: Diagnosis not present

## 2015-09-03 DIAGNOSIS — T65891A Toxic effect of other specified substances, accidental (unintentional), initial encounter: Secondary | ICD-10-CM | POA: Diagnosis not present

## 2015-09-03 DIAGNOSIS — Z836 Family history of other diseases of the respiratory system: Secondary | ICD-10-CM | POA: Diagnosis not present

## 2015-09-03 DIAGNOSIS — I110 Hypertensive heart disease with heart failure: Secondary | ICD-10-CM | POA: Diagnosis not present

## 2015-09-03 DIAGNOSIS — E119 Type 2 diabetes mellitus without complications: Secondary | ICD-10-CM | POA: Diagnosis not present

## 2015-09-03 DIAGNOSIS — Z79899 Other long term (current) drug therapy: Secondary | ICD-10-CM | POA: Diagnosis not present

## 2015-09-03 DIAGNOSIS — I509 Heart failure, unspecified: Secondary | ICD-10-CM | POA: Diagnosis not present

## 2015-09-03 DIAGNOSIS — J449 Chronic obstructive pulmonary disease, unspecified: Secondary | ICD-10-CM | POA: Diagnosis not present

## 2015-09-08 DIAGNOSIS — J440 Chronic obstructive pulmonary disease with acute lower respiratory infection: Secondary | ICD-10-CM | POA: Diagnosis not present

## 2015-09-08 DIAGNOSIS — I10 Essential (primary) hypertension: Secondary | ICD-10-CM | POA: Diagnosis not present

## 2015-09-08 DIAGNOSIS — E1142 Type 2 diabetes mellitus with diabetic polyneuropathy: Secondary | ICD-10-CM | POA: Diagnosis not present

## 2015-09-13 DIAGNOSIS — K625 Hemorrhage of anus and rectum: Secondary | ICD-10-CM | POA: Diagnosis not present

## 2015-09-13 DIAGNOSIS — Z7982 Long term (current) use of aspirin: Secondary | ICD-10-CM | POA: Diagnosis not present

## 2015-09-13 DIAGNOSIS — I2510 Atherosclerotic heart disease of native coronary artery without angina pectoris: Secondary | ICD-10-CM | POA: Diagnosis not present

## 2015-09-13 DIAGNOSIS — Z9889 Other specified postprocedural states: Secondary | ICD-10-CM | POA: Diagnosis not present

## 2015-09-13 DIAGNOSIS — Z79899 Other long term (current) drug therapy: Secondary | ICD-10-CM | POA: Diagnosis not present

## 2015-09-13 DIAGNOSIS — Z7901 Long term (current) use of anticoagulants: Secondary | ICD-10-CM | POA: Diagnosis not present

## 2015-09-13 DIAGNOSIS — E7800 Pure hypercholesterolemia, unspecified: Secondary | ICD-10-CM | POA: Diagnosis not present

## 2015-09-13 DIAGNOSIS — I509 Heart failure, unspecified: Secondary | ICD-10-CM | POA: Diagnosis not present

## 2015-09-13 DIAGNOSIS — Z711 Person with feared health complaint in whom no diagnosis is made: Secondary | ICD-10-CM | POA: Diagnosis not present

## 2015-09-13 DIAGNOSIS — E119 Type 2 diabetes mellitus without complications: Secondary | ICD-10-CM | POA: Diagnosis not present

## 2015-09-13 DIAGNOSIS — Z87891 Personal history of nicotine dependence: Secondary | ICD-10-CM | POA: Diagnosis not present

## 2015-09-13 DIAGNOSIS — I110 Hypertensive heart disease with heart failure: Secondary | ICD-10-CM | POA: Diagnosis not present

## 2015-09-13 DIAGNOSIS — Z794 Long term (current) use of insulin: Secondary | ICD-10-CM | POA: Diagnosis not present

## 2015-09-13 DIAGNOSIS — J449 Chronic obstructive pulmonary disease, unspecified: Secondary | ICD-10-CM | POA: Diagnosis not present

## 2015-09-21 DIAGNOSIS — T8522XA Displacement of intraocular lens, initial encounter: Secondary | ICD-10-CM | POA: Diagnosis not present

## 2015-09-30 DIAGNOSIS — Z23 Encounter for immunization: Secondary | ICD-10-CM | POA: Diagnosis not present

## 2015-10-18 DIAGNOSIS — Z794 Long term (current) use of insulin: Secondary | ICD-10-CM | POA: Diagnosis not present

## 2015-10-18 DIAGNOSIS — H27122 Anterior dislocation of lens, left eye: Secondary | ICD-10-CM | POA: Diagnosis not present

## 2015-10-18 DIAGNOSIS — H27132 Posterior dislocation of lens, left eye: Secondary | ICD-10-CM | POA: Diagnosis not present

## 2015-10-18 DIAGNOSIS — E119 Type 2 diabetes mellitus without complications: Secondary | ICD-10-CM | POA: Diagnosis not present

## 2015-10-18 DIAGNOSIS — T8522XA Displacement of intraocular lens, initial encounter: Secondary | ICD-10-CM | POA: Diagnosis not present

## 2015-10-18 DIAGNOSIS — Z87891 Personal history of nicotine dependence: Secondary | ICD-10-CM | POA: Diagnosis not present

## 2015-10-22 DIAGNOSIS — Z836 Family history of other diseases of the respiratory system: Secondary | ICD-10-CM | POA: Diagnosis not present

## 2015-10-22 DIAGNOSIS — I4891 Unspecified atrial fibrillation: Secondary | ICD-10-CM | POA: Diagnosis not present

## 2015-10-22 DIAGNOSIS — Z7984 Long term (current) use of oral hypoglycemic drugs: Secondary | ICD-10-CM | POA: Diagnosis not present

## 2015-10-22 DIAGNOSIS — Z7901 Long term (current) use of anticoagulants: Secondary | ICD-10-CM | POA: Diagnosis not present

## 2015-10-22 DIAGNOSIS — R0602 Shortness of breath: Secondary | ICD-10-CM | POA: Diagnosis not present

## 2015-10-22 DIAGNOSIS — Z8249 Family history of ischemic heart disease and other diseases of the circulatory system: Secondary | ICD-10-CM | POA: Diagnosis not present

## 2015-10-22 DIAGNOSIS — I2510 Atherosclerotic heart disease of native coronary artery without angina pectoris: Secondary | ICD-10-CM | POA: Diagnosis not present

## 2015-10-22 DIAGNOSIS — E119 Type 2 diabetes mellitus without complications: Secondary | ICD-10-CM | POA: Diagnosis not present

## 2015-10-22 DIAGNOSIS — J441 Chronic obstructive pulmonary disease with (acute) exacerbation: Secondary | ICD-10-CM | POA: Diagnosis not present

## 2015-10-22 DIAGNOSIS — I509 Heart failure, unspecified: Secondary | ICD-10-CM | POA: Diagnosis not present

## 2015-10-22 DIAGNOSIS — E7800 Pure hypercholesterolemia, unspecified: Secondary | ICD-10-CM | POA: Diagnosis not present

## 2015-10-22 DIAGNOSIS — Z794 Long term (current) use of insulin: Secondary | ICD-10-CM | POA: Diagnosis not present

## 2015-10-22 DIAGNOSIS — Z79899 Other long term (current) drug therapy: Secondary | ICD-10-CM | POA: Diagnosis not present

## 2015-10-22 DIAGNOSIS — Z87891 Personal history of nicotine dependence: Secondary | ICD-10-CM | POA: Diagnosis not present

## 2015-10-22 DIAGNOSIS — I110 Hypertensive heart disease with heart failure: Secondary | ICD-10-CM | POA: Diagnosis not present

## 2015-10-26 DIAGNOSIS — Z7984 Long term (current) use of oral hypoglycemic drugs: Secondary | ICD-10-CM | POA: Diagnosis not present

## 2015-10-26 DIAGNOSIS — I2510 Atherosclerotic heart disease of native coronary artery without angina pectoris: Secondary | ICD-10-CM | POA: Diagnosis not present

## 2015-10-26 DIAGNOSIS — Z7901 Long term (current) use of anticoagulants: Secondary | ICD-10-CM | POA: Diagnosis not present

## 2015-10-26 DIAGNOSIS — R197 Diarrhea, unspecified: Secondary | ICD-10-CM | POA: Diagnosis not present

## 2015-10-26 DIAGNOSIS — E119 Type 2 diabetes mellitus without complications: Secondary | ICD-10-CM | POA: Diagnosis not present

## 2015-10-26 DIAGNOSIS — Z87891 Personal history of nicotine dependence: Secondary | ICD-10-CM | POA: Diagnosis not present

## 2015-10-26 DIAGNOSIS — I110 Hypertensive heart disease with heart failure: Secondary | ICD-10-CM | POA: Diagnosis not present

## 2015-10-26 DIAGNOSIS — E7800 Pure hypercholesterolemia, unspecified: Secondary | ICD-10-CM | POA: Diagnosis not present

## 2015-10-26 DIAGNOSIS — I509 Heart failure, unspecified: Secondary | ICD-10-CM | POA: Diagnosis not present

## 2015-10-26 DIAGNOSIS — Z794 Long term (current) use of insulin: Secondary | ICD-10-CM | POA: Diagnosis not present

## 2015-10-26 DIAGNOSIS — Z79899 Other long term (current) drug therapy: Secondary | ICD-10-CM | POA: Diagnosis not present

## 2015-10-26 DIAGNOSIS — K625 Hemorrhage of anus and rectum: Secondary | ICD-10-CM | POA: Diagnosis not present

## 2015-10-26 DIAGNOSIS — J449 Chronic obstructive pulmonary disease, unspecified: Secondary | ICD-10-CM | POA: Diagnosis not present

## 2015-10-26 DIAGNOSIS — Z8249 Family history of ischemic heart disease and other diseases of the circulatory system: Secondary | ICD-10-CM | POA: Diagnosis not present

## 2015-10-26 DIAGNOSIS — I4891 Unspecified atrial fibrillation: Secondary | ICD-10-CM | POA: Diagnosis not present

## 2015-10-26 DIAGNOSIS — K648 Other hemorrhoids: Secondary | ICD-10-CM | POA: Diagnosis not present

## 2015-10-26 DIAGNOSIS — Z9889 Other specified postprocedural states: Secondary | ICD-10-CM | POA: Diagnosis not present

## 2015-11-01 DIAGNOSIS — J441 Chronic obstructive pulmonary disease with (acute) exacerbation: Secondary | ICD-10-CM | POA: Diagnosis not present

## 2015-11-01 DIAGNOSIS — I10 Essential (primary) hypertension: Secondary | ICD-10-CM | POA: Diagnosis not present

## 2015-11-01 DIAGNOSIS — R3915 Urgency of urination: Secondary | ICD-10-CM | POA: Diagnosis not present

## 2015-11-01 DIAGNOSIS — R0602 Shortness of breath: Secondary | ICD-10-CM | POA: Diagnosis not present

## 2015-11-01 DIAGNOSIS — R0902 Hypoxemia: Secondary | ICD-10-CM | POA: Diagnosis not present

## 2015-11-01 DIAGNOSIS — Z794 Long term (current) use of insulin: Secondary | ICD-10-CM | POA: Diagnosis not present

## 2015-11-01 DIAGNOSIS — K449 Diaphragmatic hernia without obstruction or gangrene: Secondary | ICD-10-CM | POA: Diagnosis present

## 2015-11-01 DIAGNOSIS — Z885 Allergy status to narcotic agent status: Secondary | ICD-10-CM | POA: Diagnosis not present

## 2015-11-01 DIAGNOSIS — E119 Type 2 diabetes mellitus without complications: Secondary | ICD-10-CM | POA: Diagnosis not present

## 2015-11-01 DIAGNOSIS — R05 Cough: Secondary | ICD-10-CM | POA: Diagnosis not present

## 2015-11-01 DIAGNOSIS — J189 Pneumonia, unspecified organism: Secondary | ICD-10-CM | POA: Diagnosis not present

## 2015-11-01 DIAGNOSIS — I482 Chronic atrial fibrillation: Secondary | ICD-10-CM | POA: Diagnosis not present

## 2015-11-01 DIAGNOSIS — I5042 Chronic combined systolic (congestive) and diastolic (congestive) heart failure: Secondary | ICD-10-CM | POA: Diagnosis not present

## 2015-11-03 ENCOUNTER — Ambulatory Visit (INDEPENDENT_AMBULATORY_CARE_PROVIDER_SITE_OTHER): Payer: Medicare Other | Admitting: Otolaryngology

## 2015-11-03 DIAGNOSIS — J342 Deviated nasal septum: Secondary | ICD-10-CM

## 2015-11-03 DIAGNOSIS — J343 Hypertrophy of nasal turbinates: Secondary | ICD-10-CM

## 2015-11-03 DIAGNOSIS — J310 Chronic rhinitis: Secondary | ICD-10-CM

## 2015-11-05 DIAGNOSIS — I110 Hypertensive heart disease with heart failure: Secondary | ICD-10-CM | POA: Diagnosis not present

## 2015-11-05 DIAGNOSIS — R0902 Hypoxemia: Secondary | ICD-10-CM | POA: Diagnosis not present

## 2015-11-05 DIAGNOSIS — Z87891 Personal history of nicotine dependence: Secondary | ICD-10-CM | POA: Diagnosis not present

## 2015-11-05 DIAGNOSIS — I509 Heart failure, unspecified: Secondary | ICD-10-CM | POA: Diagnosis not present

## 2015-11-05 DIAGNOSIS — R0602 Shortness of breath: Secondary | ICD-10-CM | POA: Diagnosis not present

## 2015-11-05 DIAGNOSIS — R069 Unspecified abnormalities of breathing: Secondary | ICD-10-CM | POA: Diagnosis not present

## 2015-11-05 DIAGNOSIS — R079 Chest pain, unspecified: Secondary | ICD-10-CM | POA: Diagnosis not present

## 2015-11-05 DIAGNOSIS — Z836 Family history of other diseases of the respiratory system: Secondary | ICD-10-CM | POA: Diagnosis not present

## 2015-11-05 DIAGNOSIS — J449 Chronic obstructive pulmonary disease, unspecified: Secondary | ICD-10-CM | POA: Diagnosis not present

## 2015-11-05 DIAGNOSIS — E119 Type 2 diabetes mellitus without complications: Secondary | ICD-10-CM | POA: Diagnosis not present

## 2015-11-05 DIAGNOSIS — J441 Chronic obstructive pulmonary disease with (acute) exacerbation: Secondary | ICD-10-CM | POA: Diagnosis not present

## 2015-11-05 DIAGNOSIS — Z8249 Family history of ischemic heart disease and other diseases of the circulatory system: Secondary | ICD-10-CM | POA: Diagnosis not present

## 2015-11-10 ENCOUNTER — Ambulatory Visit (INDEPENDENT_AMBULATORY_CARE_PROVIDER_SITE_OTHER): Payer: Medicare Other | Admitting: Otolaryngology

## 2015-11-10 ENCOUNTER — Other Ambulatory Visit: Payer: Self-pay | Admitting: Otolaryngology

## 2015-11-10 DIAGNOSIS — J310 Chronic rhinitis: Secondary | ICD-10-CM

## 2015-11-10 DIAGNOSIS — J342 Deviated nasal septum: Secondary | ICD-10-CM

## 2015-11-10 DIAGNOSIS — J343 Hypertrophy of nasal turbinates: Secondary | ICD-10-CM | POA: Diagnosis not present

## 2015-11-13 DIAGNOSIS — N400 Enlarged prostate without lower urinary tract symptoms: Secondary | ICD-10-CM | POA: Diagnosis not present

## 2015-11-13 DIAGNOSIS — I110 Hypertensive heart disease with heart failure: Secondary | ICD-10-CM | POA: Diagnosis not present

## 2015-11-13 DIAGNOSIS — Z825 Family history of asthma and other chronic lower respiratory diseases: Secondary | ICD-10-CM | POA: Diagnosis not present

## 2015-11-13 DIAGNOSIS — Z794 Long term (current) use of insulin: Secondary | ICD-10-CM | POA: Diagnosis not present

## 2015-11-13 DIAGNOSIS — I509 Heart failure, unspecified: Secondary | ICD-10-CM | POA: Diagnosis not present

## 2015-11-13 DIAGNOSIS — Z7982 Long term (current) use of aspirin: Secondary | ICD-10-CM | POA: Diagnosis not present

## 2015-11-13 DIAGNOSIS — Z7901 Long term (current) use of anticoagulants: Secondary | ICD-10-CM | POA: Diagnosis not present

## 2015-11-13 DIAGNOSIS — J441 Chronic obstructive pulmonary disease with (acute) exacerbation: Secondary | ICD-10-CM | POA: Diagnosis not present

## 2015-11-13 DIAGNOSIS — E119 Type 2 diabetes mellitus without complications: Secondary | ICD-10-CM | POA: Diagnosis not present

## 2015-11-13 DIAGNOSIS — Z87891 Personal history of nicotine dependence: Secondary | ICD-10-CM | POA: Diagnosis not present

## 2015-11-13 DIAGNOSIS — Z79899 Other long term (current) drug therapy: Secondary | ICD-10-CM | POA: Diagnosis not present

## 2015-11-13 DIAGNOSIS — Z7951 Long term (current) use of inhaled steroids: Secondary | ICD-10-CM | POA: Diagnosis not present

## 2015-11-13 DIAGNOSIS — R0602 Shortness of breath: Secondary | ICD-10-CM | POA: Diagnosis not present

## 2015-11-15 ENCOUNTER — Emergency Department (HOSPITAL_COMMUNITY)
Admission: EM | Admit: 2015-11-15 | Discharge: 2015-11-16 | Disposition: A | Discharge status: Home / Self Care | Payer: Medicare Other | Attending: Emergency Medicine | Admitting: Emergency Medicine

## 2015-11-15 ENCOUNTER — Emergency Department (HOSPITAL_COMMUNITY): Payer: Medicare Other

## 2015-11-15 ENCOUNTER — Encounter (HOSPITAL_COMMUNITY): Payer: Self-pay | Admitting: Emergency Medicine

## 2015-11-15 DIAGNOSIS — F17210 Nicotine dependence, cigarettes, uncomplicated: Secondary | ICD-10-CM | POA: Insufficient documentation

## 2015-11-15 DIAGNOSIS — Z794 Long term (current) use of insulin: Secondary | ICD-10-CM | POA: Insufficient documentation

## 2015-11-15 DIAGNOSIS — J441 Chronic obstructive pulmonary disease with (acute) exacerbation: Secondary | ICD-10-CM | POA: Diagnosis not present

## 2015-11-15 DIAGNOSIS — R0600 Dyspnea, unspecified: Secondary | ICD-10-CM | POA: Diagnosis not present

## 2015-11-15 DIAGNOSIS — E1165 Type 2 diabetes mellitus with hyperglycemia: Secondary | ICD-10-CM | POA: Diagnosis not present

## 2015-11-15 DIAGNOSIS — I10 Essential (primary) hypertension: Secondary | ICD-10-CM | POA: Diagnosis not present

## 2015-11-15 DIAGNOSIS — Z7951 Long term (current) use of inhaled steroids: Secondary | ICD-10-CM | POA: Diagnosis not present

## 2015-11-15 DIAGNOSIS — Z79899 Other long term (current) drug therapy: Secondary | ICD-10-CM | POA: Insufficient documentation

## 2015-11-15 DIAGNOSIS — R739 Hyperglycemia, unspecified: Secondary | ICD-10-CM

## 2015-11-15 DIAGNOSIS — Z7901 Long term (current) use of anticoagulants: Secondary | ICD-10-CM | POA: Diagnosis not present

## 2015-11-15 DIAGNOSIS — R0602 Shortness of breath: Secondary | ICD-10-CM | POA: Diagnosis present

## 2015-11-15 DIAGNOSIS — E860 Dehydration: Secondary | ICD-10-CM | POA: Diagnosis not present

## 2015-11-15 DIAGNOSIS — I110 Hypertensive heart disease with heart failure: Secondary | ICD-10-CM | POA: Diagnosis not present

## 2015-11-15 DIAGNOSIS — J449 Chronic obstructive pulmonary disease, unspecified: Secondary | ICD-10-CM | POA: Diagnosis not present

## 2015-11-15 DIAGNOSIS — Z9119 Patient's noncompliance with other medical treatment and regimen: Secondary | ICD-10-CM | POA: Diagnosis not present

## 2015-11-15 DIAGNOSIS — Z7982 Long term (current) use of aspirin: Secondary | ICD-10-CM | POA: Diagnosis not present

## 2015-11-15 DIAGNOSIS — Z87891 Personal history of nicotine dependence: Secondary | ICD-10-CM | POA: Diagnosis not present

## 2015-11-15 DIAGNOSIS — I509 Heart failure, unspecified: Secondary | ICD-10-CM | POA: Diagnosis not present

## 2015-11-15 HISTORY — DX: Benign prostatic hyperplasia without lower urinary tract symptoms: N40.0

## 2015-11-15 LAB — CBC WITH DIFFERENTIAL/PLATELET
Basophils Absolute: 0 10*3/uL (ref 0.0–0.1)
Basophils Relative: 0 %
Eosinophils Absolute: 0 10*3/uL (ref 0.0–0.7)
Eosinophils Relative: 0 %
HCT: 43.9 % (ref 39.0–52.0)
Hemoglobin: 14.3 g/dL (ref 13.0–17.0)
Lymphocytes Relative: 8 %
Lymphs Abs: 1.4 10*3/uL (ref 0.7–4.0)
MCH: 31.8 pg (ref 26.0–34.0)
MCHC: 32.6 g/dL (ref 30.0–36.0)
MCV: 97.6 fL (ref 78.0–100.0)
Monocytes Absolute: 1.5 10*3/uL — ABNORMAL HIGH (ref 0.1–1.0)
Monocytes Relative: 9 %
Neutro Abs: 14.1 10*3/uL — ABNORMAL HIGH (ref 1.7–7.7)
Neutrophils Relative %: 83 %
Platelets: 193 10*3/uL (ref 150–400)
RBC: 4.5 MIL/uL (ref 4.22–5.81)
RDW: 14.5 % (ref 11.5–15.5)
WBC: 17 10*3/uL — ABNORMAL HIGH (ref 4.0–10.5)

## 2015-11-15 LAB — COMPREHENSIVE METABOLIC PANEL
ALT: 28 U/L (ref 17–63)
AST: 19 U/L (ref 15–41)
Albumin: 3.8 g/dL (ref 3.5–5.0)
Alkaline Phosphatase: 50 U/L (ref 38–126)
Anion gap: 7 (ref 5–15)
BUN: 35 mg/dL — ABNORMAL HIGH (ref 6–20)
CO2: 24 mmol/L (ref 22–32)
Calcium: 9 mg/dL (ref 8.9–10.3)
Chloride: 102 mmol/L (ref 101–111)
Creatinine, Ser: 1.29 mg/dL — ABNORMAL HIGH (ref 0.61–1.24)
GFR calc Af Amer: >60 mL/min (ref >60)
GFR calc non Af Amer: 57 mL/min — ABNORMAL LOW (ref >60)
Glucose, Bld: 299 mg/dL — ABNORMAL HIGH (ref 65–99)
Potassium: 4 mmol/L (ref 3.5–5.1)
Sodium: 133 mmol/L — ABNORMAL LOW (ref 135–145)
Total Bilirubin: 0.4 mg/dL (ref 0.3–1.2)
Total Protein: 6.6 g/dL (ref 6.5–8.1)

## 2015-11-15 LAB — CBG MONITORING, ED: Glucose-Capillary: 337 mg/dL — ABNORMAL HIGH (ref 65–99)

## 2015-11-15 MED ORDER — ALBUTEROL SULFATE (2.5 MG/3ML) 0.083% IN NEBU
5.0000 mg | INHALATION_SOLUTION | Freq: Once | RESPIRATORY_TRACT | Status: DC
  Administered 2015-11-15: 5 mg via RESPIRATORY_TRACT
  Filled 2015-11-15: qty 6

## 2015-11-15 NOTE — ED Triage Notes (Signed)
Pt reports he was seen at [facility] today and told he needed to be admitted. Pt reports his blood sugar is elevated and he is SOB. Pt states they did a blood gas at [facility] and the readings were bad. Per pt there were some problems that had occurred on a previous admission and he was told he would not be admitted at [facility].

## 2015-11-16 NOTE — ED Provider Notes (Signed)
[facility] DEPT Provider Note   CSN: [id number] Arrival date & time: 11/15/15  2133     History   Chief Complaint Chief Complaint  Patient presents with  . Shortness of Breath    HPI Chase Herrera is a 65 y.o. male.  The history is provided by the patient. No language interpreter was used.  Shortness of Breath  This is a new problem. The problem occurs intermittently.The current episode started 12 to 24 hours ago. The problem has not changed since onset.Pertinent negatives include no fever. He has tried nothing for the symptoms. He has had prior hospitalizations. He has had prior ED visits. Associated medical issues include COPD.  Pt reports he became short of breath here tonight.  Pt reports he was seen at [facility] and told he needed to be admitted for elevated glucose.  Pt reports they told him he could not be admitted there.  (Pt reports someone at the hospital is mad at him)     Past Medical History:  Diagnosis Date  . Alcohol abuse    Quit 08/23/11  . Arthritis   . Atrial flutter [facility])    s/p CTI ablation by Dr [name] 10/13  . Cardiomyopathy ([place])    LVEF 25-30% 8/13, previously normal by dobutamine echo 5/13  . COPD (chronic obstructive pulmonary disease) ([place])   . Depression   . Essential hypertension, benign   . Prostate enlargement   . Type 2 diabetes mellitus [facility])     Patient Active Problem List   Diagnosis Date Noted  . COPD (chronic obstructive pulmonary disease) ([place]) 02/08/2012  . Essential hypertension, benign 01/06/2012  . Atrial flutter ([place]) 01/06/2012  . Nonischemic cardiomyopathy ([place]) 10/15/2011  . Tobacco abuse 10/15/2011    Past Surgical History:  Procedure Laterality Date  . APPENDECTOMY    . ATRIAL ABLATION SURGERY  11/06/2011   CTI ablation for atrial flutter by Dr [name]  . ATRIAL FLUTTER ABLATION N/A 11/06/2011   Procedure: ATRIAL FLUTTER ABLATION;  Surgeon: Thompson Grayer, MD;  Location: [facility] CATH LAB;  Service: Cardiovascular;   Laterality: N/A;  . Cataracts    . TONSILLECTOMY         Home Medications    Prior to Admission medications   Medication Sig Start Date End Date Taking? Authorizing Provider  albuterol (PROVENTIL HFA;VENTOLIN HFA) 108 (90 BASE) MCG/ACT inhaler Inhale 2 puffs into the lungs every 6 (six) hours as needed. For shortness of breath    Historical Provider, MD  amiodarone (PACERONE) 400 MG tablet Take 400 mg by mouth daily.    Historical Provider, MD  aspirin EC 81 MG tablet Take 81 mg by mouth daily.    Historical Provider, MD  beclomethasone (QVAR) 80 MCG/ACT inhaler Inhale 1 puff into the lungs 2 (two) times daily.    Historical Provider, MD  carvedilol (COREG) 12.5 MG tablet Take 12.5 mg by mouth 2 (two) times daily with a meal.     Historical Provider, MD  cholecalciferol (VITAMIN D) 1000 UNITS tablet Take 1,000 Units by mouth daily.    Historical Provider, MD  citalopram (CELEXA) 20 MG tablet Take 20 mg by mouth every evening.     Historical Provider, MD  furosemide (LASIX) 40 MG tablet Take 40 mg by mouth daily.    Historical Provider, MD  Insulin Glargine (LANTUS SOLOSTAR) 100 UNIT/ML Solostar Pen Inject 20 Units into the skin 2 (two) times daily.    Historical Provider, MD  ipratropium-albuterol (DUONEB) 0.5-2.5 (3) MG/3ML SOLN Take  3 mLs by nebulization every 6 (six) hours as needed (shortness of breath/wheezing).    Historical Provider, MD  LORazepam (ATIVAN) 1 MG tablet Take 1 mg by mouth 3 (three) times daily.    Historical Provider, MD  metFORMIN (GLUCOPHAGE) 500 MG tablet Take 500 mg by mouth daily.     Historical Provider, MD  metFORMIN (GLUCOPHAGE-XR) 500 MG 24 hr tablet Take 500 mg by mouth 3 (three) times daily. 10/27/15   Historical Provider, MD  Multiple Vitamin (MULTIVITAMIN WITH MINERALS) TABS tablet Take 1 tablet by mouth daily.    Historical Provider, MD  nabumetone (RELAFEN) 500 MG tablet Take 500 mg by mouth 2 (two) times daily.    Historical Provider, MD  predniSONE  (DELTASONE) 20 MG tablet Take 60 mg by mouth daily. 5 day course starting on 11/14/2015 11/14/15   Historical Provider, MD  Doug Sou 90 MCG/ACT inhaler  10/14/15   Historical Provider, MD  Rivaroxaban (XARELTO) 15 MG TABS tablet Take 15 mg by mouth daily.    Historical Provider, MD  tamsulosin (FLOMAX) 0.4 MG CAPS capsule Take 0.4 mg by mouth daily. 08/25/13   Historical Provider, MD    Family History Family History  Problem Relation Age of Onset  . Diabetes Sister   . Hypertension Mother   . Cancer Mother     lung  . Hypertension Father   . Heart attack Father   . Hypertension Sister   . Hypertension Sister   . Hypertension Sister   . Pulmonary fibrosis Sister     Social History Social History  Substance Use Topics  . Smoking status: Current Every Day Smoker    Packs/day: 0.50    Years: 47.00    Types: Cigarettes  . Smokeless tobacco: Never Used     Comment: encouraged to quit today 03/04/12  . Alcohol use Yes     Comment: quit 2 years ago     Allergies   Codeine   Review of Systems Review of Systems  Constitutional: Negative for fever.  Respiratory: Positive for shortness of breath.   All other systems reviewed and are negative.    Physical Exam Updated Vital Signs BP 187/93   Pulse 70   Temp 97.6 F (36.4 C) (Oral)   Resp 14   Ht '6\' 1"'$  (1.854 m)   Wt 131.5 kg   SpO2 97%   BMI 38.26 kg/m   Physical Exam  Constitutional: He appears well-developed and well-nourished.  HENT:  Head: Normocephalic and atraumatic.  Eyes: Conjunctivae are normal.  Neck: Neck supple.  Cardiovascular: Normal rate and regular rhythm.   No murmur heard. Pulmonary/Chest: Effort normal and breath sounds normal. No respiratory distress.  Abdominal: Soft. There is no tenderness.  Musculoskeletal: He exhibits no edema.  Neurological: He is alert.  Skin: Skin is warm and dry.  Psychiatric: He has a normal mood and affect.  Nursing note and vitals reviewed.    ED  Treatments / Results  Labs (all labs ordered are listed, but only abnormal results are displayed) Labs Reviewed  CBC WITH DIFFERENTIAL/PLATELET - Abnormal; Notable for the following:       Result Value   WBC 17.0 (*)    Neutro Abs 14.1 (*)    Monocytes Absolute 1.5 (*)    All other components within normal limits  COMPREHENSIVE METABOLIC PANEL - Abnormal; Notable for the following:    Sodium 133 (*)    Glucose, Bld 299 (*)    BUN 35 (*)  Creatinine, Ser 1.29 (*)    GFR calc non Af Amer 57 (*)    All other components within normal limits  CBG MONITORING, ED - Abnormal; Notable for the following:    Glucose-Capillary 337 (*)    All other components within normal limits    EKG  EKG Interpretation  Date/Time:  Tuesday November 15 2015 21:50:12 EDT Ventricular Rate:  71 PR Interval:  164 QRS Duration: 94 QT Interval:  402 QTC Calculation: 436 R Axis:   -28 Text Interpretation:  Sinus rhythm with Premature atrial complexes Possible Anterior infarct , age undetermined Abnormal ECG When compared with ECG of 11/07/2011, No significant change was found Confirmed by [facility]  MD, DAVID ([id number]) on 11/16/2015 12:22:18 AM       Radiology Dg Chest 2 View  Result Date: 11/15/2015 CLINICAL DATA:  Hyperglycemia and dyspnea EXAM: CHEST  2 VIEW COMPARISON:  11/13/2015 FINDINGS: The cardiac silhouette is top-normal in size. No aortic aneurysm. Streaky parenchymal opacities at each lung base and right upper lobe consistent with atelectasis and/or scarring. No overt pulmonary edema. No pulmonary consolidation or CHF. No effusion or pneumothorax. No suspicious osseous lesions. IMPRESSION: Bibasilar and right upper lobe scarring and/or atelectasis. No acute pulmonary disease. Electronically Signed   By: Ashley Royalty M.D.   On: 11/15/2015 22:28    Procedures Procedures (including critical care time)  Medications Ordered in ED Medications - No data to display   Initial Impression / Assessment  and Plan / ED Course  I have reviewed the triage vital signs and the nursing notes.  Pertinent labs & imaging results that were available during my care of the patient were reviewed by me and considered in my medical decision making (see chart for details).  Clinical Course    [facility] records show pt was given Iv fluids and insulin.  Pt had improving glucose and was discharged to see his MD tomorrow.  Pt has glucose of 299 here.  Anion gap is normal.  Normal CO2.  Pt received albuterol neb and is feeling better.  Pt is on prednisone for copd and ate cake earlier in the day.  Pt feels better.   I feel he is stable for discahrge and to follow up with his MD  Final Clinical Impressions(s) / ED Diagnoses   Final diagnoses:  Hyperglycemia  COPD exacerbation [facility])    New Prescriptions New Prescriptions   No medications on file     Fransico Meadow, PA-C 11/16/15 0055    Gwenyth Allegra Tegeler, MD 11/16/15 1353

## 2015-11-16 NOTE — Discharge Instructions (Signed)
See your Physician tomorrow for recheck

## 2015-11-20 DIAGNOSIS — I4891 Unspecified atrial fibrillation: Secondary | ICD-10-CM | POA: Diagnosis not present

## 2015-11-20 DIAGNOSIS — E1165 Type 2 diabetes mellitus with hyperglycemia: Secondary | ICD-10-CM | POA: Diagnosis not present

## 2015-11-20 DIAGNOSIS — I110 Hypertensive heart disease with heart failure: Secondary | ICD-10-CM | POA: Diagnosis not present

## 2015-11-20 DIAGNOSIS — R0602 Shortness of breath: Secondary | ICD-10-CM | POA: Diagnosis not present

## 2015-11-20 DIAGNOSIS — E7800 Pure hypercholesterolemia, unspecified: Secondary | ICD-10-CM | POA: Diagnosis not present

## 2015-11-20 DIAGNOSIS — Z7982 Long term (current) use of aspirin: Secondary | ICD-10-CM | POA: Diagnosis not present

## 2015-11-20 DIAGNOSIS — Z794 Long term (current) use of insulin: Secondary | ICD-10-CM | POA: Diagnosis not present

## 2015-11-20 DIAGNOSIS — N400 Enlarged prostate without lower urinary tract symptoms: Secondary | ICD-10-CM | POA: Diagnosis not present

## 2015-11-20 DIAGNOSIS — J441 Chronic obstructive pulmonary disease with (acute) exacerbation: Secondary | ICD-10-CM | POA: Diagnosis not present

## 2015-11-20 DIAGNOSIS — I509 Heart failure, unspecified: Secondary | ICD-10-CM | POA: Diagnosis not present

## 2015-11-20 DIAGNOSIS — Z79899 Other long term (current) drug therapy: Secondary | ICD-10-CM | POA: Diagnosis not present

## 2015-11-20 DIAGNOSIS — Z7901 Long term (current) use of anticoagulants: Secondary | ICD-10-CM | POA: Diagnosis not present

## 2015-11-20 DIAGNOSIS — Z87891 Personal history of nicotine dependence: Secondary | ICD-10-CM | POA: Diagnosis not present

## 2015-11-25 DIAGNOSIS — E119 Type 2 diabetes mellitus without complications: Secondary | ICD-10-CM | POA: Diagnosis not present

## 2015-11-25 DIAGNOSIS — Z7984 Long term (current) use of oral hypoglycemic drugs: Secondary | ICD-10-CM | POA: Diagnosis not present

## 2015-11-25 DIAGNOSIS — Z794 Long term (current) use of insulin: Secondary | ICD-10-CM | POA: Diagnosis not present

## 2015-11-25 DIAGNOSIS — E1165 Type 2 diabetes mellitus with hyperglycemia: Secondary | ICD-10-CM | POA: Diagnosis not present

## 2015-11-27 DIAGNOSIS — R0602 Shortness of breath: Secondary | ICD-10-CM | POA: Diagnosis not present

## 2015-11-27 DIAGNOSIS — I110 Hypertensive heart disease with heart failure: Secondary | ICD-10-CM | POA: Diagnosis not present

## 2015-11-27 DIAGNOSIS — Z7901 Long term (current) use of anticoagulants: Secondary | ICD-10-CM | POA: Diagnosis not present

## 2015-11-27 DIAGNOSIS — Z7982 Long term (current) use of aspirin: Secondary | ICD-10-CM | POA: Diagnosis not present

## 2015-11-27 DIAGNOSIS — Z79899 Other long term (current) drug therapy: Secondary | ICD-10-CM | POA: Diagnosis not present

## 2015-11-27 DIAGNOSIS — Z7951 Long term (current) use of inhaled steroids: Secondary | ICD-10-CM | POA: Diagnosis not present

## 2015-11-27 DIAGNOSIS — N400 Enlarged prostate without lower urinary tract symptoms: Secondary | ICD-10-CM | POA: Diagnosis not present

## 2015-11-27 DIAGNOSIS — J441 Chronic obstructive pulmonary disease with (acute) exacerbation: Secondary | ICD-10-CM | POA: Diagnosis not present

## 2015-11-27 DIAGNOSIS — Z794 Long term (current) use of insulin: Secondary | ICD-10-CM | POA: Diagnosis not present

## 2015-11-27 DIAGNOSIS — I509 Heart failure, unspecified: Secondary | ICD-10-CM | POA: Diagnosis not present

## 2015-11-27 DIAGNOSIS — E119 Type 2 diabetes mellitus without complications: Secondary | ICD-10-CM | POA: Diagnosis not present

## 2015-12-04 DIAGNOSIS — I509 Heart failure, unspecified: Secondary | ICD-10-CM | POA: Diagnosis not present

## 2015-12-04 DIAGNOSIS — Z9981 Dependence on supplemental oxygen: Secondary | ICD-10-CM | POA: Diagnosis not present

## 2015-12-04 DIAGNOSIS — J0100 Acute maxillary sinusitis, unspecified: Secondary | ICD-10-CM | POA: Diagnosis not present

## 2015-12-04 DIAGNOSIS — Z7951 Long term (current) use of inhaled steroids: Secondary | ICD-10-CM | POA: Diagnosis not present

## 2015-12-04 DIAGNOSIS — Z79899 Other long term (current) drug therapy: Secondary | ICD-10-CM | POA: Diagnosis not present

## 2015-12-04 DIAGNOSIS — E119 Type 2 diabetes mellitus without complications: Secondary | ICD-10-CM | POA: Diagnosis not present

## 2015-12-04 DIAGNOSIS — I110 Hypertensive heart disease with heart failure: Secondary | ICD-10-CM | POA: Diagnosis not present

## 2015-12-04 DIAGNOSIS — N400 Enlarged prostate without lower urinary tract symptoms: Secondary | ICD-10-CM | POA: Diagnosis not present

## 2015-12-04 DIAGNOSIS — R918 Other nonspecific abnormal finding of lung field: Secondary | ICD-10-CM | POA: Diagnosis not present

## 2015-12-04 DIAGNOSIS — E7800 Pure hypercholesterolemia, unspecified: Secondary | ICD-10-CM | POA: Diagnosis not present

## 2015-12-04 DIAGNOSIS — Z794 Long term (current) use of insulin: Secondary | ICD-10-CM | POA: Diagnosis not present

## 2015-12-04 DIAGNOSIS — J441 Chronic obstructive pulmonary disease with (acute) exacerbation: Secondary | ICD-10-CM | POA: Diagnosis not present

## 2015-12-04 DIAGNOSIS — Z7901 Long term (current) use of anticoagulants: Secondary | ICD-10-CM | POA: Diagnosis not present

## 2015-12-09 DIAGNOSIS — Z125 Encounter for screening for malignant neoplasm of prostate: Secondary | ICD-10-CM | POA: Diagnosis not present

## 2015-12-09 DIAGNOSIS — C3411 Malignant neoplasm of upper lobe, right bronchus or lung: Secondary | ICD-10-CM | POA: Diagnosis not present

## 2015-12-09 DIAGNOSIS — E1142 Type 2 diabetes mellitus with diabetic polyneuropathy: Secondary | ICD-10-CM | POA: Diagnosis not present

## 2015-12-09 DIAGNOSIS — I509 Heart failure, unspecified: Secondary | ICD-10-CM | POA: Diagnosis not present

## 2015-12-09 DIAGNOSIS — R918 Other nonspecific abnormal finding of lung field: Secondary | ICD-10-CM | POA: Diagnosis not present

## 2015-12-09 DIAGNOSIS — Z87891 Personal history of nicotine dependence: Secondary | ICD-10-CM | POA: Diagnosis not present

## 2015-12-09 DIAGNOSIS — I10 Essential (primary) hypertension: Secondary | ICD-10-CM | POA: Diagnosis not present

## 2015-12-09 DIAGNOSIS — Z7901 Long term (current) use of anticoagulants: Secondary | ICD-10-CM | POA: Diagnosis not present

## 2015-12-09 DIAGNOSIS — I110 Hypertensive heart disease with heart failure: Secondary | ICD-10-CM | POA: Diagnosis not present

## 2015-12-09 DIAGNOSIS — Z79899 Other long term (current) drug therapy: Secondary | ICD-10-CM | POA: Diagnosis not present

## 2015-12-09 DIAGNOSIS — Z7982 Long term (current) use of aspirin: Secondary | ICD-10-CM | POA: Diagnosis not present

## 2015-12-09 DIAGNOSIS — Z1389 Encounter for screening for other disorder: Secondary | ICD-10-CM | POA: Diagnosis not present

## 2015-12-09 DIAGNOSIS — J440 Chronic obstructive pulmonary disease with acute lower respiratory infection: Secondary | ICD-10-CM | POA: Diagnosis not present

## 2015-12-09 DIAGNOSIS — N400 Enlarged prostate without lower urinary tract symptoms: Secondary | ICD-10-CM | POA: Diagnosis not present

## 2015-12-09 DIAGNOSIS — E119 Type 2 diabetes mellitus without complications: Secondary | ICD-10-CM | POA: Diagnosis not present

## 2015-12-09 DIAGNOSIS — R079 Chest pain, unspecified: Secondary | ICD-10-CM | POA: Diagnosis not present

## 2015-12-09 DIAGNOSIS — J449 Chronic obstructive pulmonary disease, unspecified: Secondary | ICD-10-CM | POA: Diagnosis not present

## 2015-12-09 DIAGNOSIS — I471 Supraventricular tachycardia: Secondary | ICD-10-CM | POA: Diagnosis not present

## 2015-12-09 DIAGNOSIS — Z0000 Encounter for general adult medical examination without abnormal findings: Secondary | ICD-10-CM | POA: Diagnosis not present

## 2015-12-09 DIAGNOSIS — Z7951 Long term (current) use of inhaled steroids: Secondary | ICD-10-CM | POA: Diagnosis not present

## 2015-12-09 DIAGNOSIS — Z794 Long term (current) use of insulin: Secondary | ICD-10-CM | POA: Diagnosis not present

## 2015-12-10 DIAGNOSIS — E119 Type 2 diabetes mellitus without complications: Secondary | ICD-10-CM | POA: Diagnosis not present

## 2015-12-10 DIAGNOSIS — I4891 Unspecified atrial fibrillation: Secondary | ICD-10-CM | POA: Diagnosis not present

## 2015-12-10 DIAGNOSIS — J449 Chronic obstructive pulmonary disease, unspecified: Secondary | ICD-10-CM | POA: Diagnosis not present

## 2015-12-10 DIAGNOSIS — E876 Hypokalemia: Secondary | ICD-10-CM | POA: Diagnosis not present

## 2015-12-10 DIAGNOSIS — I10 Essential (primary) hypertension: Secondary | ICD-10-CM | POA: Diagnosis not present

## 2015-12-10 DIAGNOSIS — E1065 Type 1 diabetes mellitus with hyperglycemia: Secondary | ICD-10-CM | POA: Diagnosis not present

## 2015-12-10 DIAGNOSIS — R918 Other nonspecific abnormal finding of lung field: Secondary | ICD-10-CM | POA: Diagnosis not present

## 2015-12-10 DIAGNOSIS — K449 Diaphragmatic hernia without obstruction or gangrene: Secondary | ICD-10-CM | POA: Diagnosis not present

## 2015-12-10 DIAGNOSIS — I484 Atypical atrial flutter: Secondary | ICD-10-CM | POA: Diagnosis not present

## 2015-12-10 DIAGNOSIS — I5042 Chronic combined systolic (congestive) and diastolic (congestive) heart failure: Secondary | ICD-10-CM | POA: Diagnosis not present

## 2015-12-10 DIAGNOSIS — R0789 Other chest pain: Secondary | ICD-10-CM | POA: Diagnosis not present

## 2015-12-10 DIAGNOSIS — R079 Chest pain, unspecified: Secondary | ICD-10-CM | POA: Diagnosis not present

## 2015-12-11 DIAGNOSIS — I484 Atypical atrial flutter: Secondary | ICD-10-CM | POA: Diagnosis not present

## 2015-12-11 DIAGNOSIS — I5042 Chronic combined systolic (congestive) and diastolic (congestive) heart failure: Secondary | ICD-10-CM | POA: Diagnosis not present

## 2015-12-11 DIAGNOSIS — R918 Other nonspecific abnormal finding of lung field: Secondary | ICD-10-CM | POA: Diagnosis not present

## 2015-12-11 DIAGNOSIS — R0789 Other chest pain: Secondary | ICD-10-CM | POA: Diagnosis not present

## 2015-12-11 DIAGNOSIS — E876 Hypokalemia: Secondary | ICD-10-CM | POA: Diagnosis not present

## 2015-12-12 ENCOUNTER — Other Ambulatory Visit (HOSPITAL_COMMUNITY): Payer: Self-pay | Admitting: Internal Medicine

## 2015-12-12 DIAGNOSIS — R918 Other nonspecific abnormal finding of lung field: Secondary | ICD-10-CM

## 2015-12-13 DIAGNOSIS — E860 Dehydration: Secondary | ICD-10-CM | POA: Diagnosis not present

## 2015-12-13 DIAGNOSIS — Z87891 Personal history of nicotine dependence: Secondary | ICD-10-CM | POA: Diagnosis not present

## 2015-12-13 DIAGNOSIS — Z79899 Other long term (current) drug therapy: Secondary | ICD-10-CM | POA: Diagnosis not present

## 2015-12-13 DIAGNOSIS — J449 Chronic obstructive pulmonary disease, unspecified: Secondary | ICD-10-CM | POA: Diagnosis not present

## 2015-12-13 DIAGNOSIS — R0602 Shortness of breath: Secondary | ICD-10-CM | POA: Diagnosis not present

## 2015-12-13 DIAGNOSIS — I509 Heart failure, unspecified: Secondary | ICD-10-CM | POA: Diagnosis not present

## 2015-12-13 DIAGNOSIS — E119 Type 2 diabetes mellitus without complications: Secondary | ICD-10-CM | POA: Diagnosis not present

## 2015-12-13 DIAGNOSIS — Z7982 Long term (current) use of aspirin: Secondary | ICD-10-CM | POA: Diagnosis not present

## 2015-12-13 DIAGNOSIS — R002 Palpitations: Secondary | ICD-10-CM | POA: Diagnosis not present

## 2015-12-13 DIAGNOSIS — Z794 Long term (current) use of insulin: Secondary | ICD-10-CM | POA: Diagnosis not present

## 2015-12-13 DIAGNOSIS — Z87442 Personal history of urinary calculi: Secondary | ICD-10-CM | POA: Diagnosis not present

## 2015-12-13 DIAGNOSIS — I110 Hypertensive heart disease with heart failure: Secondary | ICD-10-CM | POA: Diagnosis not present

## 2015-12-13 DIAGNOSIS — I4891 Unspecified atrial fibrillation: Secondary | ICD-10-CM | POA: Diagnosis not present

## 2015-12-21 ENCOUNTER — Ambulatory Visit (HOSPITAL_COMMUNITY): Admission: RE | Admit: 2015-12-21 | Payer: Medicare Other | Source: Ambulatory Visit | Admitting: Otolaryngology

## 2015-12-21 ENCOUNTER — Encounter (HOSPITAL_COMMUNITY): Admission: RE | Payer: Self-pay | Source: Ambulatory Visit

## 2015-12-21 DIAGNOSIS — I5042 Chronic combined systolic (congestive) and diastolic (congestive) heart failure: Secondary | ICD-10-CM | POA: Diagnosis not present

## 2015-12-21 DIAGNOSIS — I2510 Atherosclerotic heart disease of native coronary artery without angina pectoris: Secondary | ICD-10-CM | POA: Diagnosis not present

## 2015-12-21 DIAGNOSIS — I482 Chronic atrial fibrillation: Secondary | ICD-10-CM | POA: Diagnosis not present

## 2015-12-21 DIAGNOSIS — E119 Type 2 diabetes mellitus without complications: Secondary | ICD-10-CM | POA: Diagnosis not present

## 2015-12-21 DIAGNOSIS — Z801 Family history of malignant neoplasm of trachea, bronchus and lung: Secondary | ICD-10-CM | POA: Diagnosis not present

## 2015-12-21 DIAGNOSIS — Z79899 Other long term (current) drug therapy: Secondary | ICD-10-CM | POA: Diagnosis not present

## 2015-12-21 DIAGNOSIS — I5040 Unspecified combined systolic (congestive) and diastolic (congestive) heart failure: Secondary | ICD-10-CM | POA: Diagnosis not present

## 2015-12-21 DIAGNOSIS — R531 Weakness: Secondary | ICD-10-CM | POA: Diagnosis not present

## 2015-12-21 DIAGNOSIS — Z87891 Personal history of nicotine dependence: Secondary | ICD-10-CM | POA: Diagnosis not present

## 2015-12-21 DIAGNOSIS — K449 Diaphragmatic hernia without obstruction or gangrene: Secondary | ICD-10-CM | POA: Diagnosis not present

## 2015-12-21 DIAGNOSIS — Z794 Long term (current) use of insulin: Secondary | ICD-10-CM | POA: Diagnosis not present

## 2015-12-21 DIAGNOSIS — J449 Chronic obstructive pulmonary disease, unspecified: Secondary | ICD-10-CM | POA: Diagnosis not present

## 2015-12-21 DIAGNOSIS — Z7901 Long term (current) use of anticoagulants: Secondary | ICD-10-CM | POA: Diagnosis not present

## 2015-12-21 DIAGNOSIS — R918 Other nonspecific abnormal finding of lung field: Secondary | ICD-10-CM | POA: Diagnosis not present

## 2015-12-21 DIAGNOSIS — I110 Hypertensive heart disease with heart failure: Secondary | ICD-10-CM | POA: Diagnosis not present

## 2015-12-21 DIAGNOSIS — Z886 Allergy status to analgesic agent status: Secondary | ICD-10-CM | POA: Diagnosis not present

## 2015-12-21 DIAGNOSIS — Z8249 Family history of ischemic heart disease and other diseases of the circulatory system: Secondary | ICD-10-CM | POA: Diagnosis not present

## 2015-12-21 DIAGNOSIS — I4892 Unspecified atrial flutter: Secondary | ICD-10-CM | POA: Diagnosis not present

## 2015-12-21 DIAGNOSIS — I481 Persistent atrial fibrillation: Secondary | ICD-10-CM | POA: Diagnosis not present

## 2015-12-21 DIAGNOSIS — Z7982 Long term (current) use of aspirin: Secondary | ICD-10-CM | POA: Diagnosis not present

## 2015-12-21 DIAGNOSIS — R0602 Shortness of breath: Secondary | ICD-10-CM | POA: Diagnosis not present

## 2015-12-21 DIAGNOSIS — I509 Heart failure, unspecified: Secondary | ICD-10-CM | POA: Diagnosis not present

## 2015-12-21 DIAGNOSIS — I4891 Unspecified atrial fibrillation: Secondary | ICD-10-CM | POA: Diagnosis not present

## 2015-12-21 DIAGNOSIS — Z836 Family history of other diseases of the respiratory system: Secondary | ICD-10-CM | POA: Diagnosis not present

## 2015-12-21 SURGERY — SEPTOPLASTY, NOSE, WITH NASAL TURBINATE REDUCTION
Anesthesia: General | Laterality: Bilateral

## 2015-12-22 ENCOUNTER — Encounter (HOSPITAL_COMMUNITY)
Admission: RE | Admit: 2015-12-22 | Discharge: 2015-12-22 | Disposition: A | Discharge status: Home / Self Care | Payer: Medicare Other | Source: Ambulatory Visit | Attending: Internal Medicine | Admitting: Internal Medicine

## 2015-12-22 DIAGNOSIS — I5042 Chronic combined systolic (congestive) and diastolic (congestive) heart failure: Secondary | ICD-10-CM | POA: Diagnosis not present

## 2015-12-22 DIAGNOSIS — R918 Other nonspecific abnormal finding of lung field: Secondary | ICD-10-CM | POA: Insufficient documentation

## 2015-12-22 DIAGNOSIS — I481 Persistent atrial fibrillation: Secondary | ICD-10-CM | POA: Diagnosis not present

## 2015-12-22 DIAGNOSIS — I482 Chronic atrial fibrillation: Secondary | ICD-10-CM | POA: Diagnosis not present

## 2015-12-22 DIAGNOSIS — I110 Hypertensive heart disease with heart failure: Secondary | ICD-10-CM | POA: Diagnosis not present

## 2015-12-22 LAB — GLUCOSE, CAPILLARY: Glucose-Capillary: 129 mg/dL — ABNORMAL HIGH (ref 65–99)

## 2015-12-22 MED ORDER — FLUDEOXYGLUCOSE F - 18 (FDG) INJECTION: 13.4000 | Freq: Once | INTRAVENOUS | Status: DC | PRN

## 2015-12-30 DIAGNOSIS — I4891 Unspecified atrial fibrillation: Secondary | ICD-10-CM | POA: Diagnosis not present

## 2015-12-30 DIAGNOSIS — E669 Obesity, unspecified: Secondary | ICD-10-CM | POA: Diagnosis not present

## 2015-12-30 DIAGNOSIS — E119 Type 2 diabetes mellitus without complications: Secondary | ICD-10-CM | POA: Diagnosis not present

## 2015-12-30 DIAGNOSIS — D381 Neoplasm of uncertain behavior of trachea, bronchus and lung: Secondary | ICD-10-CM | POA: Diagnosis not present

## 2015-12-30 DIAGNOSIS — Z7901 Long term (current) use of anticoagulants: Secondary | ICD-10-CM | POA: Diagnosis not present

## 2015-12-30 DIAGNOSIS — Z87891 Personal history of nicotine dependence: Secondary | ICD-10-CM | POA: Diagnosis not present

## 2015-12-30 DIAGNOSIS — J449 Chronic obstructive pulmonary disease, unspecified: Secondary | ICD-10-CM | POA: Diagnosis not present

## 2015-12-30 DIAGNOSIS — Z6837 Body mass index (BMI) 37.0-37.9, adult: Secondary | ICD-10-CM | POA: Diagnosis not present

## 2016-01-19 DIAGNOSIS — Z01818 Encounter for other preprocedural examination: Secondary | ICD-10-CM | POA: Diagnosis not present

## 2016-01-19 DIAGNOSIS — R911 Solitary pulmonary nodule: Secondary | ICD-10-CM | POA: Diagnosis not present

## 2016-01-19 DIAGNOSIS — R918 Other nonspecific abnormal finding of lung field: Secondary | ICD-10-CM | POA: Diagnosis not present

## 2016-01-19 DIAGNOSIS — Z01811 Encounter for preprocedural respiratory examination: Secondary | ICD-10-CM | POA: Diagnosis not present

## 2016-01-22 DIAGNOSIS — R0602 Shortness of breath: Secondary | ICD-10-CM | POA: Diagnosis not present

## 2016-01-22 DIAGNOSIS — J440 Chronic obstructive pulmonary disease with acute lower respiratory infection: Secondary | ICD-10-CM | POA: Diagnosis not present

## 2016-01-22 DIAGNOSIS — Z87891 Personal history of nicotine dependence: Secondary | ICD-10-CM | POA: Diagnosis not present

## 2016-01-23 DIAGNOSIS — C3490 Malignant neoplasm of unspecified part of unspecified bronchus or lung: Secondary | ICD-10-CM | POA: Diagnosis not present

## 2016-01-23 DIAGNOSIS — Z79899 Other long term (current) drug therapy: Secondary | ICD-10-CM | POA: Diagnosis not present

## 2016-01-23 DIAGNOSIS — J209 Acute bronchitis, unspecified: Secondary | ICD-10-CM | POA: Diagnosis not present

## 2016-01-23 DIAGNOSIS — R0602 Shortness of breath: Secondary | ICD-10-CM | POA: Diagnosis not present

## 2016-01-23 DIAGNOSIS — Z7901 Long term (current) use of anticoagulants: Secondary | ICD-10-CM | POA: Diagnosis not present

## 2016-01-23 DIAGNOSIS — J440 Chronic obstructive pulmonary disease with acute lower respiratory infection: Secondary | ICD-10-CM | POA: Diagnosis not present

## 2016-01-23 DIAGNOSIS — Z809 Family history of malignant neoplasm, unspecified: Secondary | ICD-10-CM | POA: Diagnosis not present

## 2016-01-23 DIAGNOSIS — Z7982 Long term (current) use of aspirin: Secondary | ICD-10-CM | POA: Diagnosis not present

## 2016-01-23 DIAGNOSIS — Z825 Family history of asthma and other chronic lower respiratory diseases: Secondary | ICD-10-CM | POA: Diagnosis not present

## 2016-01-23 DIAGNOSIS — E119 Type 2 diabetes mellitus without complications: Secondary | ICD-10-CM | POA: Diagnosis not present

## 2016-01-23 DIAGNOSIS — Z794 Long term (current) use of insulin: Secondary | ICD-10-CM | POA: Diagnosis not present

## 2016-01-23 DIAGNOSIS — I110 Hypertensive heart disease with heart failure: Secondary | ICD-10-CM | POA: Diagnosis not present

## 2016-01-23 DIAGNOSIS — Z87891 Personal history of nicotine dependence: Secondary | ICD-10-CM | POA: Diagnosis not present

## 2016-01-23 DIAGNOSIS — I509 Heart failure, unspecified: Secondary | ICD-10-CM | POA: Diagnosis not present

## 2016-01-23 DIAGNOSIS — N400 Enlarged prostate without lower urinary tract symptoms: Secondary | ICD-10-CM | POA: Diagnosis not present

## 2016-01-23 DIAGNOSIS — Z8249 Family history of ischemic heart disease and other diseases of the circulatory system: Secondary | ICD-10-CM | POA: Diagnosis not present

## 2016-01-31 DIAGNOSIS — Z01810 Encounter for preprocedural cardiovascular examination: Secondary | ICD-10-CM | POA: Diagnosis not present

## 2016-01-31 DIAGNOSIS — Z794 Long term (current) use of insulin: Secondary | ICD-10-CM | POA: Diagnosis not present

## 2016-01-31 DIAGNOSIS — J988 Other specified respiratory disorders: Secondary | ICD-10-CM | POA: Diagnosis not present

## 2016-01-31 DIAGNOSIS — J449 Chronic obstructive pulmonary disease, unspecified: Secondary | ICD-10-CM | POA: Diagnosis not present

## 2016-01-31 DIAGNOSIS — R918 Other nonspecific abnormal finding of lung field: Secondary | ICD-10-CM | POA: Diagnosis not present

## 2016-01-31 DIAGNOSIS — Z01818 Encounter for other preprocedural examination: Secondary | ICD-10-CM | POA: Diagnosis not present

## 2016-01-31 DIAGNOSIS — E119 Type 2 diabetes mellitus without complications: Secondary | ICD-10-CM | POA: Diagnosis not present

## 2016-01-31 DIAGNOSIS — I480 Paroxysmal atrial fibrillation: Secondary | ICD-10-CM | POA: Diagnosis not present

## 2016-01-31 DIAGNOSIS — I428 Other cardiomyopathies: Secondary | ICD-10-CM | POA: Diagnosis not present

## 2016-01-31 DIAGNOSIS — Z01811 Encounter for preprocedural respiratory examination: Secondary | ICD-10-CM | POA: Diagnosis not present

## 2016-01-31 DIAGNOSIS — Z6839 Body mass index (BMI) 39.0-39.9, adult: Secondary | ICD-10-CM | POA: Diagnosis not present

## 2016-01-31 DIAGNOSIS — Z7901 Long term (current) use of anticoagulants: Secondary | ICD-10-CM | POA: Diagnosis not present

## 2016-01-31 DIAGNOSIS — G4719 Other hypersomnia: Secondary | ICD-10-CM | POA: Diagnosis not present

## 2016-01-31 DIAGNOSIS — N400 Enlarged prostate without lower urinary tract symptoms: Secondary | ICD-10-CM | POA: Diagnosis not present

## 2016-01-31 DIAGNOSIS — J984 Other disorders of lung: Secondary | ICD-10-CM | POA: Diagnosis not present

## 2016-01-31 DIAGNOSIS — R0683 Snoring: Secondary | ICD-10-CM | POA: Diagnosis not present

## 2016-01-31 DIAGNOSIS — E669 Obesity, unspecified: Secondary | ICD-10-CM | POA: Diagnosis not present

## 2016-01-31 DIAGNOSIS — I10 Essential (primary) hypertension: Secondary | ICD-10-CM | POA: Diagnosis not present

## 2016-02-03 DIAGNOSIS — Z7902 Long term (current) use of antithrombotics/antiplatelets: Secondary | ICD-10-CM | POA: Diagnosis not present

## 2016-02-03 DIAGNOSIS — I4891 Unspecified atrial fibrillation: Secondary | ICD-10-CM | POA: Diagnosis not present

## 2016-02-03 DIAGNOSIS — Z7901 Long term (current) use of anticoagulants: Secondary | ICD-10-CM | POA: Diagnosis not present

## 2016-02-03 DIAGNOSIS — Z7982 Long term (current) use of aspirin: Secondary | ICD-10-CM | POA: Diagnosis not present

## 2016-02-03 DIAGNOSIS — Z9889 Other specified postprocedural states: Secondary | ICD-10-CM | POA: Diagnosis not present

## 2016-02-03 DIAGNOSIS — Z8679 Personal history of other diseases of the circulatory system: Secondary | ICD-10-CM | POA: Diagnosis not present

## 2016-02-03 DIAGNOSIS — E1165 Type 2 diabetes mellitus with hyperglycemia: Secondary | ICD-10-CM | POA: Diagnosis not present

## 2016-02-03 DIAGNOSIS — Z87891 Personal history of nicotine dependence: Secondary | ICD-10-CM | POA: Diagnosis not present

## 2016-02-03 DIAGNOSIS — Z79899 Other long term (current) drug therapy: Secondary | ICD-10-CM | POA: Diagnosis not present

## 2016-02-03 DIAGNOSIS — J449 Chronic obstructive pulmonary disease, unspecified: Secondary | ICD-10-CM | POA: Diagnosis not present

## 2016-02-03 DIAGNOSIS — Z794 Long term (current) use of insulin: Secondary | ICD-10-CM | POA: Diagnosis not present

## 2016-02-03 DIAGNOSIS — R9431 Abnormal electrocardiogram [ECG] [EKG]: Secondary | ICD-10-CM | POA: Diagnosis not present

## 2016-02-03 DIAGNOSIS — Z885 Allergy status to narcotic agent status: Secondary | ICD-10-CM | POA: Diagnosis not present

## 2016-02-03 DIAGNOSIS — Z7951 Long term (current) use of inhaled steroids: Secondary | ICD-10-CM | POA: Diagnosis not present

## 2016-02-14 DIAGNOSIS — Z7982 Long term (current) use of aspirin: Secondary | ICD-10-CM | POA: Diagnosis not present

## 2016-02-14 DIAGNOSIS — I4891 Unspecified atrial fibrillation: Secondary | ICD-10-CM | POA: Diagnosis not present

## 2016-02-18 DIAGNOSIS — I509 Heart failure, unspecified: Secondary | ICD-10-CM | POA: Diagnosis not present

## 2016-02-18 DIAGNOSIS — Z794 Long term (current) use of insulin: Secondary | ICD-10-CM | POA: Diagnosis not present

## 2016-02-18 DIAGNOSIS — Z7901 Long term (current) use of anticoagulants: Secondary | ICD-10-CM | POA: Diagnosis not present

## 2016-02-18 DIAGNOSIS — Z9981 Dependence on supplemental oxygen: Secondary | ICD-10-CM | POA: Diagnosis not present

## 2016-02-18 DIAGNOSIS — N400 Enlarged prostate without lower urinary tract symptoms: Secondary | ICD-10-CM | POA: Diagnosis not present

## 2016-02-18 DIAGNOSIS — Z87891 Personal history of nicotine dependence: Secondary | ICD-10-CM | POA: Diagnosis not present

## 2016-02-18 DIAGNOSIS — E119 Type 2 diabetes mellitus without complications: Secondary | ICD-10-CM | POA: Diagnosis not present

## 2016-02-18 DIAGNOSIS — C3490 Malignant neoplasm of unspecified part of unspecified bronchus or lung: Secondary | ICD-10-CM | POA: Diagnosis not present

## 2016-02-18 DIAGNOSIS — Z79899 Other long term (current) drug therapy: Secondary | ICD-10-CM | POA: Diagnosis not present

## 2016-02-18 DIAGNOSIS — I110 Hypertensive heart disease with heart failure: Secondary | ICD-10-CM | POA: Diagnosis not present

## 2016-02-18 DIAGNOSIS — J441 Chronic obstructive pulmonary disease with (acute) exacerbation: Secondary | ICD-10-CM | POA: Diagnosis not present

## 2016-02-18 DIAGNOSIS — R0602 Shortness of breath: Secondary | ICD-10-CM | POA: Diagnosis not present

## 2016-02-18 DIAGNOSIS — Z7982 Long term (current) use of aspirin: Secondary | ICD-10-CM | POA: Diagnosis not present

## 2016-03-04 DIAGNOSIS — Z87891 Personal history of nicotine dependence: Secondary | ICD-10-CM | POA: Diagnosis not present

## 2016-03-04 DIAGNOSIS — Z794 Long term (current) use of insulin: Secondary | ICD-10-CM | POA: Diagnosis not present

## 2016-03-04 DIAGNOSIS — R918 Other nonspecific abnormal finding of lung field: Secondary | ICD-10-CM | POA: Diagnosis not present

## 2016-03-04 DIAGNOSIS — R0602 Shortness of breath: Secondary | ICD-10-CM | POA: Diagnosis not present

## 2016-03-04 DIAGNOSIS — I509 Heart failure, unspecified: Secondary | ICD-10-CM | POA: Diagnosis not present

## 2016-03-04 DIAGNOSIS — Z836 Family history of other diseases of the respiratory system: Secondary | ICD-10-CM | POA: Diagnosis not present

## 2016-03-04 DIAGNOSIS — E119 Type 2 diabetes mellitus without complications: Secondary | ICD-10-CM | POA: Diagnosis not present

## 2016-03-04 DIAGNOSIS — J449 Chronic obstructive pulmonary disease, unspecified: Secondary | ICD-10-CM | POA: Diagnosis not present

## 2016-03-04 DIAGNOSIS — I110 Hypertensive heart disease with heart failure: Secondary | ICD-10-CM | POA: Diagnosis not present

## 2016-03-04 DIAGNOSIS — I4891 Unspecified atrial fibrillation: Secondary | ICD-10-CM | POA: Diagnosis not present

## 2016-03-04 DIAGNOSIS — Z8249 Family history of ischemic heart disease and other diseases of the circulatory system: Secondary | ICD-10-CM | POA: Diagnosis not present

## 2016-03-04 DIAGNOSIS — J42 Unspecified chronic bronchitis: Secondary | ICD-10-CM | POA: Diagnosis not present

## 2016-03-04 DIAGNOSIS — Z79899 Other long term (current) drug therapy: Secondary | ICD-10-CM | POA: Diagnosis not present

## 2016-03-05 DIAGNOSIS — J449 Chronic obstructive pulmonary disease, unspecified: Secondary | ICD-10-CM | POA: Diagnosis not present

## 2016-03-15 DIAGNOSIS — G629 Polyneuropathy, unspecified: Secondary | ICD-10-CM | POA: Diagnosis not present

## 2016-03-15 DIAGNOSIS — N401 Enlarged prostate with lower urinary tract symptoms: Secondary | ICD-10-CM | POA: Diagnosis not present

## 2016-03-15 DIAGNOSIS — I10 Essential (primary) hypertension: Secondary | ICD-10-CM | POA: Diagnosis not present

## 2016-03-15 DIAGNOSIS — R918 Other nonspecific abnormal finding of lung field: Secondary | ICD-10-CM | POA: Diagnosis not present

## 2016-03-15 DIAGNOSIS — Z87891 Personal history of nicotine dependence: Secondary | ICD-10-CM | POA: Diagnosis not present

## 2016-03-15 DIAGNOSIS — I429 Cardiomyopathy, unspecified: Secondary | ICD-10-CM | POA: Diagnosis not present

## 2016-03-15 DIAGNOSIS — K5900 Constipation, unspecified: Secondary | ICD-10-CM | POA: Diagnosis not present

## 2016-03-15 DIAGNOSIS — I480 Paroxysmal atrial fibrillation: Secondary | ICD-10-CM | POA: Diagnosis not present

## 2016-03-15 DIAGNOSIS — J449 Chronic obstructive pulmonary disease, unspecified: Secondary | ICD-10-CM | POA: Diagnosis not present

## 2016-03-15 DIAGNOSIS — E119 Type 2 diabetes mellitus without complications: Secondary | ICD-10-CM | POA: Diagnosis not present

## 2016-03-15 DIAGNOSIS — Z9981 Dependence on supplemental oxygen: Secondary | ICD-10-CM | POA: Diagnosis not present

## 2016-03-15 DIAGNOSIS — R0902 Hypoxemia: Secondary | ICD-10-CM | POA: Diagnosis not present

## 2016-03-15 DIAGNOSIS — Z6839 Body mass index (BMI) 39.0-39.9, adult: Secondary | ICD-10-CM | POA: Diagnosis not present

## 2016-03-17 DIAGNOSIS — I2510 Atherosclerotic heart disease of native coronary artery without angina pectoris: Secondary | ICD-10-CM | POA: Diagnosis not present

## 2016-03-17 DIAGNOSIS — Z825 Family history of asthma and other chronic lower respiratory diseases: Secondary | ICD-10-CM | POA: Diagnosis not present

## 2016-03-17 DIAGNOSIS — Z8249 Family history of ischemic heart disease and other diseases of the circulatory system: Secondary | ICD-10-CM | POA: Diagnosis not present

## 2016-03-17 DIAGNOSIS — Z87891 Personal history of nicotine dependence: Secondary | ICD-10-CM | POA: Diagnosis not present

## 2016-03-17 DIAGNOSIS — I5042 Chronic combined systolic (congestive) and diastolic (congestive) heart failure: Secondary | ICD-10-CM | POA: Diagnosis not present

## 2016-03-17 DIAGNOSIS — J441 Chronic obstructive pulmonary disease with (acute) exacerbation: Secondary | ICD-10-CM | POA: Diagnosis not present

## 2016-03-17 DIAGNOSIS — Z7982 Long term (current) use of aspirin: Secondary | ICD-10-CM | POA: Diagnosis not present

## 2016-03-17 DIAGNOSIS — J9611 Chronic respiratory failure with hypoxia: Secondary | ICD-10-CM | POA: Diagnosis not present

## 2016-03-17 DIAGNOSIS — Z7901 Long term (current) use of anticoagulants: Secondary | ICD-10-CM | POA: Diagnosis not present

## 2016-03-17 DIAGNOSIS — I482 Chronic atrial fibrillation: Secondary | ICD-10-CM | POA: Diagnosis not present

## 2016-03-17 DIAGNOSIS — E119 Type 2 diabetes mellitus without complications: Secondary | ICD-10-CM | POA: Diagnosis not present

## 2016-03-17 DIAGNOSIS — I110 Hypertensive heart disease with heart failure: Secondary | ICD-10-CM | POA: Diagnosis not present

## 2016-03-17 DIAGNOSIS — Z886 Allergy status to analgesic agent status: Secondary | ICD-10-CM | POA: Diagnosis not present

## 2016-03-17 DIAGNOSIS — K449 Diaphragmatic hernia without obstruction or gangrene: Secondary | ICD-10-CM | POA: Diagnosis not present

## 2016-03-17 DIAGNOSIS — Z9981 Dependence on supplemental oxygen: Secondary | ICD-10-CM | POA: Diagnosis not present

## 2016-03-17 DIAGNOSIS — R918 Other nonspecific abnormal finding of lung field: Secondary | ICD-10-CM | POA: Diagnosis not present

## 2016-03-17 DIAGNOSIS — R0602 Shortness of breath: Secondary | ICD-10-CM | POA: Diagnosis not present

## 2016-03-17 DIAGNOSIS — Z79899 Other long term (current) drug therapy: Secondary | ICD-10-CM | POA: Diagnosis not present

## 2016-03-17 DIAGNOSIS — Z801 Family history of malignant neoplasm of trachea, bronchus and lung: Secondary | ICD-10-CM | POA: Diagnosis not present

## 2016-03-17 DIAGNOSIS — Z794 Long term (current) use of insulin: Secondary | ICD-10-CM | POA: Diagnosis not present

## 2016-03-18 DIAGNOSIS — J9611 Chronic respiratory failure with hypoxia: Secondary | ICD-10-CM | POA: Diagnosis not present

## 2016-03-18 DIAGNOSIS — R918 Other nonspecific abnormal finding of lung field: Secondary | ICD-10-CM | POA: Diagnosis not present

## 2016-03-18 DIAGNOSIS — J441 Chronic obstructive pulmonary disease with (acute) exacerbation: Secondary | ICD-10-CM | POA: Diagnosis not present

## 2016-03-18 DIAGNOSIS — I110 Hypertensive heart disease with heart failure: Secondary | ICD-10-CM | POA: Diagnosis not present

## 2016-03-18 DIAGNOSIS — E119 Type 2 diabetes mellitus without complications: Secondary | ICD-10-CM | POA: Diagnosis not present

## 2016-03-18 DIAGNOSIS — I5042 Chronic combined systolic (congestive) and diastolic (congestive) heart failure: Secondary | ICD-10-CM | POA: Diagnosis not present

## 2016-03-19 DIAGNOSIS — R918 Other nonspecific abnormal finding of lung field: Secondary | ICD-10-CM | POA: Diagnosis not present

## 2016-03-19 DIAGNOSIS — J9611 Chronic respiratory failure with hypoxia: Secondary | ICD-10-CM | POA: Diagnosis not present

## 2016-03-19 DIAGNOSIS — J441 Chronic obstructive pulmonary disease with (acute) exacerbation: Secondary | ICD-10-CM | POA: Diagnosis not present

## 2016-03-19 DIAGNOSIS — E119 Type 2 diabetes mellitus without complications: Secondary | ICD-10-CM | POA: Diagnosis not present

## 2016-03-19 DIAGNOSIS — I5042 Chronic combined systolic (congestive) and diastolic (congestive) heart failure: Secondary | ICD-10-CM | POA: Diagnosis not present

## 2016-03-19 DIAGNOSIS — I110 Hypertensive heart disease with heart failure: Secondary | ICD-10-CM | POA: Diagnosis not present

## 2016-03-22 DIAGNOSIS — R0789 Other chest pain: Secondary | ICD-10-CM | POA: Diagnosis not present

## 2016-03-22 DIAGNOSIS — R0902 Hypoxemia: Secondary | ICD-10-CM | POA: Diagnosis not present

## 2016-03-22 DIAGNOSIS — G629 Polyneuropathy, unspecified: Secondary | ICD-10-CM | POA: Diagnosis present

## 2016-03-22 DIAGNOSIS — Z9981 Dependence on supplemental oxygen: Secondary | ICD-10-CM | POA: Diagnosis not present

## 2016-03-22 DIAGNOSIS — K5900 Constipation, unspecified: Secondary | ICD-10-CM | POA: Diagnosis present

## 2016-03-22 DIAGNOSIS — R911 Solitary pulmonary nodule: Secondary | ICD-10-CM | POA: Diagnosis not present

## 2016-03-22 DIAGNOSIS — G8918 Other acute postprocedural pain: Secondary | ICD-10-CM | POA: Diagnosis not present

## 2016-03-22 DIAGNOSIS — J90 Pleural effusion, not elsewhere classified: Secondary | ICD-10-CM | POA: Diagnosis not present

## 2016-03-22 DIAGNOSIS — I429 Cardiomyopathy, unspecified: Secondary | ICD-10-CM | POA: Diagnosis present

## 2016-03-22 DIAGNOSIS — I480 Paroxysmal atrial fibrillation: Secondary | ICD-10-CM | POA: Diagnosis present

## 2016-03-22 DIAGNOSIS — C3411 Malignant neoplasm of upper lobe, right bronchus or lung: Secondary | ICD-10-CM | POA: Diagnosis not present

## 2016-03-22 DIAGNOSIS — E119 Type 2 diabetes mellitus without complications: Secondary | ICD-10-CM | POA: Diagnosis present

## 2016-03-22 DIAGNOSIS — R918 Other nonspecific abnormal finding of lung field: Secondary | ICD-10-CM | POA: Diagnosis present

## 2016-03-22 DIAGNOSIS — C781 Secondary malignant neoplasm of mediastinum: Secondary | ICD-10-CM | POA: Diagnosis not present

## 2016-03-22 DIAGNOSIS — N401 Enlarged prostate with lower urinary tract symptoms: Secondary | ICD-10-CM | POA: Diagnosis present

## 2016-03-22 DIAGNOSIS — I10 Essential (primary) hypertension: Secondary | ICD-10-CM | POA: Diagnosis present

## 2016-03-22 DIAGNOSIS — J9811 Atelectasis: Secondary | ICD-10-CM | POA: Diagnosis not present

## 2016-03-22 DIAGNOSIS — Z87891 Personal history of nicotine dependence: Secondary | ICD-10-CM | POA: Diagnosis not present

## 2016-03-22 DIAGNOSIS — J449 Chronic obstructive pulmonary disease, unspecified: Secondary | ICD-10-CM | POA: Diagnosis present

## 2016-03-22 DIAGNOSIS — Z6839 Body mass index (BMI) 39.0-39.9, adult: Secondary | ICD-10-CM | POA: Diagnosis not present

## 2016-03-30 DIAGNOSIS — C3431 Malignant neoplasm of lower lobe, right bronchus or lung: Secondary | ICD-10-CM | POA: Diagnosis not present

## 2016-03-30 DIAGNOSIS — E1142 Type 2 diabetes mellitus with diabetic polyneuropathy: Secondary | ICD-10-CM | POA: Diagnosis not present

## 2016-03-30 DIAGNOSIS — I10 Essential (primary) hypertension: Secondary | ICD-10-CM | POA: Diagnosis not present

## 2016-03-30 DIAGNOSIS — J440 Chronic obstructive pulmonary disease with acute lower respiratory infection: Secondary | ICD-10-CM | POA: Diagnosis not present

## 2016-04-02 DIAGNOSIS — Z79899 Other long term (current) drug therapy: Secondary | ICD-10-CM | POA: Diagnosis not present

## 2016-04-02 DIAGNOSIS — R079 Chest pain, unspecified: Secondary | ICD-10-CM | POA: Diagnosis not present

## 2016-04-02 DIAGNOSIS — Z7982 Long term (current) use of aspirin: Secondary | ICD-10-CM | POA: Diagnosis not present

## 2016-04-02 DIAGNOSIS — I110 Hypertensive heart disease with heart failure: Secondary | ICD-10-CM | POA: Diagnosis not present

## 2016-04-02 DIAGNOSIS — J449 Chronic obstructive pulmonary disease, unspecified: Secondary | ICD-10-CM | POA: Diagnosis not present

## 2016-04-02 DIAGNOSIS — E119 Type 2 diabetes mellitus without complications: Secondary | ICD-10-CM | POA: Diagnosis not present

## 2016-04-02 DIAGNOSIS — T8189XA Other complications of procedures, not elsewhere classified, initial encounter: Secondary | ICD-10-CM | POA: Diagnosis not present

## 2016-04-02 DIAGNOSIS — I509 Heart failure, unspecified: Secondary | ICD-10-CM | POA: Diagnosis not present

## 2016-04-02 DIAGNOSIS — N400 Enlarged prostate without lower urinary tract symptoms: Secondary | ICD-10-CM | POA: Diagnosis not present

## 2016-04-02 DIAGNOSIS — Z87891 Personal history of nicotine dependence: Secondary | ICD-10-CM | POA: Diagnosis not present

## 2016-04-02 DIAGNOSIS — Z794 Long term (current) use of insulin: Secondary | ICD-10-CM | POA: Diagnosis not present

## 2016-04-02 DIAGNOSIS — C3411 Malignant neoplasm of upper lobe, right bronchus or lung: Secondary | ICD-10-CM | POA: Diagnosis not present

## 2016-04-02 DIAGNOSIS — J189 Pneumonia, unspecified organism: Secondary | ICD-10-CM | POA: Diagnosis not present

## 2016-04-02 DIAGNOSIS — R0602 Shortness of breath: Secondary | ICD-10-CM | POA: Diagnosis not present

## 2016-04-02 DIAGNOSIS — T8131XA Disruption of external operation (surgical) wound, not elsewhere classified, initial encounter: Secondary | ICD-10-CM | POA: Diagnosis not present

## 2016-04-03 ENCOUNTER — Encounter (HOSPITAL_COMMUNITY): Payer: Self-pay

## 2016-04-03 ENCOUNTER — Encounter (HOSPITAL_COMMUNITY): Payer: Medicare Other

## 2016-04-03 ENCOUNTER — Encounter (HOSPITAL_COMMUNITY): Payer: Medicare Other | Attending: Oncology | Admitting: Oncology

## 2016-04-03 DIAGNOSIS — C3491 Malignant neoplasm of unspecified part of right bronchus or lung: Secondary | ICD-10-CM | POA: Insufficient documentation

## 2016-04-03 DIAGNOSIS — Z9889 Other specified postprocedural states: Secondary | ICD-10-CM | POA: Insufficient documentation

## 2016-04-03 DIAGNOSIS — C3411 Malignant neoplasm of upper lobe, right bronchus or lung: Secondary | ICD-10-CM | POA: Diagnosis not present

## 2016-04-03 DIAGNOSIS — F329 Major depressive disorder, single episode, unspecified: Secondary | ICD-10-CM | POA: Insufficient documentation

## 2016-04-03 DIAGNOSIS — Z794 Long term (current) use of insulin: Secondary | ICD-10-CM | POA: Insufficient documentation

## 2016-04-03 DIAGNOSIS — I480 Paroxysmal atrial fibrillation: Secondary | ICD-10-CM | POA: Insufficient documentation

## 2016-04-03 DIAGNOSIS — Z87891 Personal history of nicotine dependence: Secondary | ICD-10-CM | POA: Insufficient documentation

## 2016-04-03 DIAGNOSIS — I4892 Unspecified atrial flutter: Secondary | ICD-10-CM | POA: Insufficient documentation

## 2016-04-03 DIAGNOSIS — C3490 Malignant neoplasm of unspecified part of unspecified bronchus or lung: Secondary | ICD-10-CM | POA: Insufficient documentation

## 2016-04-03 DIAGNOSIS — Z7982 Long term (current) use of aspirin: Secondary | ICD-10-CM | POA: Insufficient documentation

## 2016-04-03 DIAGNOSIS — E119 Type 2 diabetes mellitus without complications: Secondary | ICD-10-CM | POA: Insufficient documentation

## 2016-04-03 DIAGNOSIS — J449 Chronic obstructive pulmonary disease, unspecified: Secondary | ICD-10-CM | POA: Insufficient documentation

## 2016-04-03 DIAGNOSIS — F1010 Alcohol abuse, uncomplicated: Secondary | ICD-10-CM | POA: Insufficient documentation

## 2016-04-03 DIAGNOSIS — I429 Cardiomyopathy, unspecified: Secondary | ICD-10-CM | POA: Insufficient documentation

## 2016-04-03 NOTE — Progress Notes (Signed)
[facility]  CONSULT NOTE  Patient Care Team: No Pcp Per Patient as PCP - General (General Practice)  CHIEF COMPLAINTS/PURPOSE OF CONSULTATION:  Small cell carcinoma of RUL  HISTORY OF PRESENTING ILLNESS:  Chase Herrera 66 y.o. male previous smoker with a history of severe COPD on 2L O2, PAF (on Xarelto), and DM who is here because of a RUL mass. The patient was evaluated by Dr. Lianne Moris in CT surgery, who noticed a RUL mass. A CT of the chest was done and showed a 4 cm hypermetabolic (SUV 32.9) RUL mass that was invading rib #1 and rib #2 posterolaterally on PET. On 03/22/16 patient underwent a bronchoscopy with right VATS and biopsy of RUL mass. Surgical path from biopsy showed high grade neuroendocrine carcinoma, most consistent with small cell carcinoma.   He is accompanied today by his 3 sister. There was such a large gap in time between the scans and his biopsy because his COPD was causing very low oxygen levels. Patient was supposed to undergo resection of the RUL mass however he needed a stress test for pre-op cardiac clearance, however there was a big delay getting it done. Patient never underwent resection. He is a previous 3 ppd smoker, quit in 2015. He has been smoking since the age of 34. He uses O2 at night, but not much during the day. He has some SOB with exertion. It resolves after stopping and resting for some time. For a few months now, he has had some mild chest pain, but had severe pains yesterday and went to the ED. He tries to stay active, but when at home, he watches a lot of TV. He lives with his niece. Denies headaches, coughing up blood, weight loss, loss of appetite, or any other concerns.    MEDICAL HISTORY:  Past Medical History:  Diagnosis Date  . Alcohol abuse    Quit 08/23/11  . Arthritis   . Atrial flutter [facility])    s/p CTI ablation by Dr [name] 10/13  . Cardiomyopathy ([place])    LVEF 25-30% 8/13, previously normal by dobutamine echo 5/13  . COPD  (chronic obstructive pulmonary disease) ([place])   . Depression   . Essential hypertension, benign   . Prostate enlargement   . Type 2 diabetes mellitus ([place])     SURGICAL HISTORY: Past Surgical History:  Procedure Laterality Date  . APPENDECTOMY    . ATRIAL ABLATION SURGERY  11/06/2011   CTI ablation for atrial flutter by Dr [name]  . ATRIAL FLUTTER ABLATION N/A 11/06/2011   Procedure: ATRIAL FLUTTER ABLATION;  Surgeon: Thompson Grayer, MD;  Location[facility] CATH LAB;  Service: Cardiovascular;  Laterality: N/A;  . Cataracts    . TONSILLECTOMY      SOCIAL HISTORY: Social History   Social History  . Marital status: Single    Spouse name: N/A  . Number of children: N/A  . Years of education: N/A   Occupational History  . retired      Building services engineer   Social History Main Topics  . Smoking status: Current Every Day Smoker    Packs/day: 0.50    Years: 47.00    Types: Cigarettes  . Smokeless tobacco: Never Used     Comment: encouraged to quit today 03/04/12  . Alcohol use Yes     Comment: quit 2 years ago  . Drug use: No     Comment: marajuana occasionally  . Sexual activity: Not Currently   Other Topics Concern  .  Not on file   Social History Narrative   Lives in [place] alone.   Disabled due to COPD   Previousliy worked as a Electrical engineer    FAMILY HISTORY: Family History  Problem Relation Age of Onset  . Diabetes Sister   . Hypertension Mother   . Cancer Mother     lung  . Hypertension Father   . Heart attack Father   . Hypertension Sister   . Hypertension Sister   . Hypertension Sister   . Pulmonary fibrosis Sister     ALLERGIES:  is allergic to codeine.  MEDICATIONS:  Current Outpatient Prescriptions  Medication Sig Dispense Refill  . albuterol (PROVENTIL HFA;VENTOLIN HFA) 108 (90 BASE) MCG/ACT inhaler Inhale 2 puffs into the lungs every 6 (six) hours as needed. For shortness of breath    . amiodarone (PACERONE) 400 MG tablet Take 400 mg by  mouth daily.    Marland Kitchen aspirin EC 81 MG tablet Take 81 mg by mouth daily.    . beclomethasone (QVAR) 80 MCG/ACT inhaler Inhale 1 puff into the lungs 2 (two) times daily.    . carvedilol (COREG) 12.5 MG tablet Take 12.5 mg by mouth 2 (two) times daily with a meal.     . cholecalciferol (VITAMIN D) 1000 UNITS tablet Take 1,000 Units by mouth daily.    . citalopram (CELEXA) 20 MG tablet Take 20 mg by mouth every evening.     . furosemide (LASIX) 40 MG tablet Take 40 mg by mouth daily.    . Insulin Glargine (LANTUS SOLOSTAR) 100 UNIT/ML Solostar Pen Inject 20 Units into the skin 2 (two) times daily.    Marland Kitchen ipratropium-albuterol (DUONEB) 0.5-2.5 (3) MG/3ML SOLN Take 3 mLs by nebulization every 6 (six) hours as needed (shortness of breath/wheezing).    . LORazepam (ATIVAN) 1 MG tablet Take 1 mg by mouth 3 (three) times daily.    . metFORMIN (GLUCOPHAGE) 500 MG tablet Take 500 mg by mouth daily.     . metFORMIN (GLUCOPHAGE-XR) 500 MG 24 hr tablet Take 500 mg by mouth 3 (three) times daily.    . Multiple Vitamin (MULTIVITAMIN WITH MINERALS) TABS tablet Take 1 tablet by mouth daily.    . nabumetone (RELAFEN) 500 MG tablet Take 500 mg by mouth 2 (two) times daily.    . predniSONE (DELTASONE) 20 MG tablet Take 60 mg by mouth daily. 5 day course starting on 11/14/2015    . PULMICORT FLEXHALER 90 MCG/ACT inhaler     . Rivaroxaban (XARELTO) 15 MG TABS tablet Take 15 mg by mouth daily.    . tamsulosin (FLOMAX) 0.4 MG CAPS capsule Take 0.4 mg by mouth daily.     No current facility-administered medications for this visit.    Review of Systems  Constitutional: Negative.  Negative for weight loss.  HENT: Negative.   Eyes: Negative.   Respiratory: Positive for shortness of breath (exertion).        No coughing up blood  Cardiovascular: Positive for chest pain.  Gastrointestinal: Negative.   Genitourinary: Negative.   Musculoskeletal: Negative.   Skin: Negative.   Neurological: Negative.  Negative for  headaches.  Endo/Heme/Allergies: Negative.   Psychiatric/Behavioral: Negative.   All other systems reviewed and are negative. 14 point ROS was done and is otherwise as detailed above or in HPI  PHYSICAL EXAMINATION: ECOG PERFORMANCE STATUS: 1 - Symptomatic but completely ambulatory  Vitals:   04/03/16 1451  BP: (!) 143/82  Pulse: 73  Resp: (!) 22  Temp: 98.6 F (37 C)   Filed Weights   04/03/16 1451  Weight: (!) 305 lb 6.4 oz (138.5 kg)   Physical Exam  Constitutional: He is oriented to person, place, and time and well-developed, well-nourished, and in no distress.  HENT:  Head: Normocephalic and atraumatic.  Mouth/Throat: Oropharynx is clear and moist.  Eyes: Conjunctivae and EOM are normal. Pupils are equal, round, and reactive to light.  Neck: Normal range of motion. Neck supple.  Cardiovascular: Normal rate, regular rhythm and normal heart sounds.   Pulmonary/Chest: Effort normal and breath sounds normal.  Abdominal: Soft. Bowel sounds are normal.  Musculoskeletal: Normal range of motion.  Neurological: He is alert and oriented to person, place, and time. Gait normal.  Skin: Skin is warm and dry.  Nursing note and vitals reviewed.  LABORATORY DATA:  I have reviewed the data as listed Lab Results  Component Value Date   WBC 17.0 (H) 11/15/2015   HGB 14.3 11/15/2015   HCT 43.9 11/15/2015   MCV 97.6 11/15/2015   PLT 193 11/15/2015   CMP     Component Value Date/Time   NA 133 (L) 11/15/2015 2305   K 4.0 11/15/2015 2305   CL 102 11/15/2015 2305   CO2 24 11/15/2015 2305   GLUCOSE 299 (H) 11/15/2015 2305   BUN 35 (H) 11/15/2015 2305   CREATININE 1.29 (H) 11/15/2015 2305   CALCIUM 9.0 11/15/2015 2305   PROT 6.6 11/15/2015 2305   ALBUMIN 3.8 11/15/2015 2305   AST 19 11/15/2015 2305   ALT 28 11/15/2015 2305   ALKPHOS 50 11/15/2015 2305   BILITOT 0.4 11/15/2015 2305   GFRNONAA 57 (L) 11/15/2015 2305   GFRAA >60 11/15/2015 2305   PATHOLOGY:  Outside report  03/22/2016 Diagnosis: "RIGHT UPPER LOBE MASS"; BIOPSY: High grade neuroendocrine carcinoma, most consistent with small cell carcinoma.  Note: the tumor cells are positive for CK7, TTF-1 (diffuse and strong), synaptophysin (diffuse and strong), chromogranin (focal and weak) while negative for Napsin A, CK20, CDX2 and p63 supporting the diagnosis.  A Ki67 stain shows a proliferative rate of 40%  RADIOGRAPHIC STUDIES: I have personally reviewed the radiological images as listed and agreed with the findings in the report.  PET scan 12/22/2015 IMPRESSION: 1. Hypermetabolic RIGHT upper lobe mass abutting pleural surface of the RIGHT lung apex consistent with primary bronchogenic carcinoma. 2. No evidence of metastatic disease.  T2a N0 M0 . 3. Paraseptal emphysema.  Outside Imaging 12/09/2015 Chest CT IMPRESSION: 4 cm spiculated right apical mass, adjacent to 2nd rib. No mediastinal adenopathy.   ASSESSMENT & PLAN:  Small cell carcinoma of RUL  PLAN: Patient was initially found to have a RUL mass in 11/2015 however it was only recently that he had a biopsy performed. He needs to be restaged since it has been 5 months, and given the aggressive nature of small cell lung cancer, it is highly possible that he has more disease now. I have ordered a restaging PET-CT. Also ordered MRI brain w/ and w/o contrast to rule out brain mets. Referred to get a port placed. He would like to have that the same day as his scan due to transportation problems.   RTC in 1 week to review restaging scans and to discuss the next plan of care.   ORDERS PLACED FOR THIS ENCOUNTER: Orders Placed This Encounter  Procedures  . NM PET Image Restag (PS) Skull Base To Thigh    Standing Status:   Future    Standing Expiration  Date:   04/03/2017    Order Specific Question:   Reason for Exam (SYMPTOM  OR DIAGNOSIS REQUIRED)    Answer:   restaging PET prior to starting therapy.    Order Specific Question:   If indicated for  the ordered procedure, I authorize the administration of a radiopharmaceutical per Radiology protocol    Answer:   Yes    Order Specific Question:   Preferred imaging location?    Answer:   [facility]    Order Specific Question:   Radiology Contrast Protocol will attach to exam    Answer:   \\[email or web address]\[email or web address]  . MR Brain W Wo Contrast    Standing Status:   Future    Standing Expiration Date:   04/03/2017    Order Specific Question:   If indicated for the ordered procedure, I authorize the administration of contrast media per Radiology protocol    Answer:   Yes    Order Specific Question:   Reason for Exam (SYMPTOM  OR DIAGNOSIS REQUIRED)    Answer:   small cell CA staging MRI    Order Specific Question:   What is the patient's sedation requirement?    Answer:   Anti-anxiety    Order Specific Question:   Does the patient have a pacemaker or implanted devices?    Answer:   No    Order Specific Question:   Preferred imaging location?    Answer:   [facility] (table limit-350 lbs)    Order Specific Question:   Radiology Contrast Protocol will attach to exam    Answer:   \\[email or web address]  . CBC with Differential    Standing Status:   Future    Standing Expiration Date:   04/03/2017  . Comprehensive metabolic panel    Standing Status:   Future    Standing Expiration Date:   04/03/2017     MEDICATIONS PRESCRIBED THIS ENCOUNTER: No orders of the defined types were placed in this encounter.  All questions were answered. The patient knows to call the clinic with any problems, questions or concerns.  This document serves as a record of services personally performed by Twana First, MD. It was created on her behalf by Martinique Casey, a trained medical scribe. The creation of this record is based on the scribe's personal observations and the provider's statements to them. This document has been checked and approved by the  attending provider.  I have reviewed the above documentation for accuracy and completeness and I agree with the above.  This note was electronically signed.    Martinique M Casey  04/03/2016 2:53 PM

## 2016-04-03 NOTE — Patient Instructions (Addendum)
[place] at [facility] Discharge Instructions  RECOMMENDATIONS MADE BY THE CONSULTANT AND ANY TEST RESULTS WILL BE SENT TO YOUR REFERRING PHYSICIAN.  You were seen today by Dr. Twana First We will get you scheduled for port a cath placement We will get you scheduled for MRI Brain and PET/CT scan Follow up in 1 week with lab work See Amy up front for appointments   Thank you for choosing [place] at [facility] to provide your oncology and hematology care.  To afford each patient quality time with our provider, please arrive at least 15 minutes before your scheduled appointment time.    If you have a lab appointment with the [facility] please come in thru the  Main Entrance and check in at the main information desk  You need to re-schedule your appointment should you arrive 10 or more minutes late.  We strive to give you quality time with our providers, and arriving late affects you and other patients whose appointments are after yours.  Also, if you no show three or more times for appointments you may be dismissed from the clinic at the providers discretion.     Again, thank you for choosing [facility]  Our hope is that these requests will decrease the amount of time that you wait before being seen by our physicians.       _____________________________________________________________  Should you have questions after your visit to [facility], please contact our office at [phone] between the hours of 8:30 a.m. and 4:30 p.m.  Voicemails left after 4:30 p.m. will not be returned until the following business day.  For prescription refill requests, have your pharmacy contact our office.       Resources For Cancer Patients and their Caregivers ? American Cancer Society: Can assist with transportation, wigs, general needs, runs Look Good Feel Better.        6048456108 ? Cancer Care: Provides  financial assistance, online support groups, medication/co-pay assistance.  1-800-813-HOPE 505-183-4213) ? [place] Assists [place] cancer patients and their families through emotional , educational and financial support.  6176244734 ? [place] DSS Where to apply for food stamps, Medicaid and utility assistance. 249 632 6877 ? [facility]: Transportation to medical appointments. (585)511-9503 ? Social Security Administration: May apply for disability if have a Stage IV cancer. 8034045082 939-252-9939 ? [employer], Disability and Transit Services: Assists with nutrition, care and transit needs. [place] Support Programs: '@10RELATIVEDAYS'$ @ > Cancer Support Group  2nd Tuesday of the month 1pm-2pm, Journey Room  > Creative Journey  3rd Tuesday of the month 1130am-1pm, Journey Room  > Look Good Feel Better  1st Wednesday of the month 10am-12 noon, Journey Room (Call [place] to register [phone])

## 2016-04-04 ENCOUNTER — Other Ambulatory Visit (HOSPITAL_COMMUNITY): Payer: Self-pay | Admitting: Oncology

## 2016-04-04 ENCOUNTER — Telehealth (HOSPITAL_COMMUNITY): Payer: Self-pay

## 2016-04-04 ENCOUNTER — Ambulatory Visit (HOSPITAL_COMMUNITY): Payer: Medicare Other

## 2016-04-04 MED ORDER — OXYCODONE-ACETAMINOPHEN 5-325 MG PO TABS: 1.0000 | ORAL_TABLET | ORAL | 0 refills | Status: DC | PRN

## 2016-04-04 NOTE — Telephone Encounter (Signed)
Patient called stating he was having an increase in pain in his right lower lung area and rib cage. States it is pulsing and rates as a 6 on pain scale. He states the pain is continually getting worse. The only pain medication he has is OxyContin 5 mg Q 4 hrs PRN left over from his surgery. Notified MD. She wrote prescription for Percocet. Called and notified patient that prescription was ready for him to pick up at front desk on 4th floor. Patient verbalized understanding.

## 2016-04-05 ENCOUNTER — Other Ambulatory Visit: Payer: Self-pay | Admitting: Radiology

## 2016-04-05 ENCOUNTER — Ambulatory Visit (HOSPITAL_COMMUNITY): Payer: Medicare Other

## 2016-04-06 ENCOUNTER — Ambulatory Visit (HOSPITAL_COMMUNITY): Payer: Medicare Other

## 2016-04-06 ENCOUNTER — Other Ambulatory Visit: Payer: Self-pay | Admitting: General Surgery

## 2016-04-07 DIAGNOSIS — C3490 Malignant neoplasm of unspecified part of unspecified bronchus or lung: Secondary | ICD-10-CM | POA: Diagnosis not present

## 2016-04-07 DIAGNOSIS — Z87442 Personal history of urinary calculi: Secondary | ICD-10-CM | POA: Diagnosis not present

## 2016-04-07 DIAGNOSIS — J441 Chronic obstructive pulmonary disease with (acute) exacerbation: Secondary | ICD-10-CM | POA: Diagnosis not present

## 2016-04-07 DIAGNOSIS — N400 Enlarged prostate without lower urinary tract symptoms: Secondary | ICD-10-CM | POA: Diagnosis not present

## 2016-04-07 DIAGNOSIS — I4891 Unspecified atrial fibrillation: Secondary | ICD-10-CM | POA: Diagnosis not present

## 2016-04-07 DIAGNOSIS — R0602 Shortness of breath: Secondary | ICD-10-CM | POA: Diagnosis not present

## 2016-04-07 DIAGNOSIS — K449 Diaphragmatic hernia without obstruction or gangrene: Secondary | ICD-10-CM | POA: Diagnosis not present

## 2016-04-07 DIAGNOSIS — I5041 Acute combined systolic (congestive) and diastolic (congestive) heart failure: Secondary | ICD-10-CM | POA: Diagnosis not present

## 2016-04-07 DIAGNOSIS — E119 Type 2 diabetes mellitus without complications: Secondary | ICD-10-CM | POA: Diagnosis not present

## 2016-04-07 DIAGNOSIS — Z8249 Family history of ischemic heart disease and other diseases of the circulatory system: Secondary | ICD-10-CM | POA: Diagnosis not present

## 2016-04-07 DIAGNOSIS — I481 Persistent atrial fibrillation: Secondary | ICD-10-CM | POA: Diagnosis not present

## 2016-04-07 DIAGNOSIS — Z87891 Personal history of nicotine dependence: Secondary | ICD-10-CM | POA: Diagnosis not present

## 2016-04-07 DIAGNOSIS — Z7901 Long term (current) use of anticoagulants: Secondary | ICD-10-CM | POA: Diagnosis not present

## 2016-04-07 DIAGNOSIS — I5040 Unspecified combined systolic (congestive) and diastolic (congestive) heart failure: Secondary | ICD-10-CM | POA: Diagnosis not present

## 2016-04-07 DIAGNOSIS — Z886 Allergy status to analgesic agent status: Secondary | ICD-10-CM | POA: Diagnosis not present

## 2016-04-07 DIAGNOSIS — Z794 Long term (current) use of insulin: Secondary | ICD-10-CM | POA: Diagnosis not present

## 2016-04-07 DIAGNOSIS — Z79899 Other long term (current) drug therapy: Secondary | ICD-10-CM | POA: Diagnosis not present

## 2016-04-07 DIAGNOSIS — Z836 Family history of other diseases of the respiratory system: Secondary | ICD-10-CM | POA: Diagnosis not present

## 2016-04-07 DIAGNOSIS — C3411 Malignant neoplasm of upper lobe, right bronchus or lung: Secondary | ICD-10-CM | POA: Diagnosis not present

## 2016-04-07 DIAGNOSIS — Z7982 Long term (current) use of aspirin: Secondary | ICD-10-CM | POA: Diagnosis not present

## 2016-04-07 DIAGNOSIS — I110 Hypertensive heart disease with heart failure: Secondary | ICD-10-CM | POA: Diagnosis not present

## 2016-04-07 DIAGNOSIS — Z801 Family history of malignant neoplasm of trachea, bronchus and lung: Secondary | ICD-10-CM | POA: Diagnosis not present

## 2016-04-08 DIAGNOSIS — I481 Persistent atrial fibrillation: Secondary | ICD-10-CM | POA: Diagnosis not present

## 2016-04-08 DIAGNOSIS — I5041 Acute combined systolic (congestive) and diastolic (congestive) heart failure: Secondary | ICD-10-CM | POA: Diagnosis not present

## 2016-04-08 DIAGNOSIS — J441 Chronic obstructive pulmonary disease with (acute) exacerbation: Secondary | ICD-10-CM | POA: Diagnosis not present

## 2016-04-08 DIAGNOSIS — C3411 Malignant neoplasm of upper lobe, right bronchus or lung: Secondary | ICD-10-CM | POA: Diagnosis not present

## 2016-04-08 DIAGNOSIS — I110 Hypertensive heart disease with heart failure: Secondary | ICD-10-CM | POA: Diagnosis not present

## 2016-04-09 ENCOUNTER — Ambulatory Visit (HOSPITAL_COMMUNITY)
Admission: RE | Admit: 2016-04-09 | Discharge: 2016-04-09 | Disposition: A | Discharge status: Home / Self Care | Payer: Medicare Other | Source: Ambulatory Visit | Attending: Oncology | Admitting: Oncology

## 2016-04-09 ENCOUNTER — Other Ambulatory Visit (HOSPITAL_COMMUNITY): Payer: Self-pay | Admitting: Oncology

## 2016-04-09 ENCOUNTER — Encounter (HOSPITAL_COMMUNITY): Payer: Self-pay

## 2016-04-09 DIAGNOSIS — I10 Essential (primary) hypertension: Secondary | ICD-10-CM | POA: Diagnosis not present

## 2016-04-09 DIAGNOSIS — C3491 Malignant neoplasm of unspecified part of right bronchus or lung: Secondary | ICD-10-CM | POA: Diagnosis not present

## 2016-04-09 DIAGNOSIS — J441 Chronic obstructive pulmonary disease with (acute) exacerbation: Secondary | ICD-10-CM | POA: Diagnosis not present

## 2016-04-09 DIAGNOSIS — F17210 Nicotine dependence, cigarettes, uncomplicated: Secondary | ICD-10-CM | POA: Insufficient documentation

## 2016-04-09 DIAGNOSIS — E119 Type 2 diabetes mellitus without complications: Secondary | ICD-10-CM | POA: Diagnosis not present

## 2016-04-09 DIAGNOSIS — Z7951 Long term (current) use of inhaled steroids: Secondary | ICD-10-CM | POA: Diagnosis not present

## 2016-04-09 DIAGNOSIS — Z9889 Other specified postprocedural states: Secondary | ICD-10-CM | POA: Diagnosis not present

## 2016-04-09 DIAGNOSIS — I429 Cardiomyopathy, unspecified: Secondary | ICD-10-CM | POA: Diagnosis not present

## 2016-04-09 DIAGNOSIS — Z794 Long term (current) use of insulin: Secondary | ICD-10-CM | POA: Insufficient documentation

## 2016-04-09 DIAGNOSIS — F329 Major depressive disorder, single episode, unspecified: Secondary | ICD-10-CM | POA: Diagnosis not present

## 2016-04-09 DIAGNOSIS — Z801 Family history of malignant neoplasm of trachea, bronchus and lung: Secondary | ICD-10-CM | POA: Diagnosis not present

## 2016-04-09 DIAGNOSIS — I4892 Unspecified atrial flutter: Secondary | ICD-10-CM | POA: Insufficient documentation

## 2016-04-09 DIAGNOSIS — Z8249 Family history of ischemic heart disease and other diseases of the circulatory system: Secondary | ICD-10-CM | POA: Diagnosis not present

## 2016-04-09 DIAGNOSIS — Z79899 Other long term (current) drug therapy: Secondary | ICD-10-CM | POA: Insufficient documentation

## 2016-04-09 DIAGNOSIS — Z836 Family history of other diseases of the respiratory system: Secondary | ICD-10-CM | POA: Insufficient documentation

## 2016-04-09 DIAGNOSIS — N400 Enlarged prostate without lower urinary tract symptoms: Secondary | ICD-10-CM | POA: Insufficient documentation

## 2016-04-09 DIAGNOSIS — Z7982 Long term (current) use of aspirin: Secondary | ICD-10-CM | POA: Insufficient documentation

## 2016-04-09 DIAGNOSIS — C3490 Malignant neoplasm of unspecified part of unspecified bronchus or lung: Secondary | ICD-10-CM | POA: Diagnosis not present

## 2016-04-09 DIAGNOSIS — Z79891 Long term (current) use of opiate analgesic: Secondary | ICD-10-CM | POA: Diagnosis not present

## 2016-04-09 DIAGNOSIS — Z452 Encounter for adjustment and management of vascular access device: Secondary | ICD-10-CM | POA: Diagnosis not present

## 2016-04-09 HISTORY — DX: Unspecified atrial fibrillation: I48.91

## 2016-04-09 HISTORY — DX: Heart failure, unspecified: I50.9

## 2016-04-09 HISTORY — PX: IR GENERIC HISTORICAL: IMG1180011

## 2016-04-09 HISTORY — DX: Obesity, unspecified: E66.9

## 2016-04-09 LAB — BASIC METABOLIC PANEL
Anion gap: 8 (ref 5–15)
BUN: 25 mg/dL — ABNORMAL HIGH (ref 6–20)
CO2: 26 mmol/L (ref 22–32)
Calcium: 9.1 mg/dL (ref 8.9–10.3)
Chloride: 105 mmol/L (ref 101–111)
Creatinine, Ser: 1.07 mg/dL (ref 0.61–1.24)
GFR calc Af Amer: >60 mL/min (ref >60)
GFR calc non Af Amer: >60 mL/min (ref >60)
Glucose, Bld: 218 mg/dL — ABNORMAL HIGH (ref 65–99)
Potassium: 4.3 mmol/L (ref 3.5–5.1)
Sodium: 139 mmol/L (ref 135–145)

## 2016-04-09 LAB — CBC
HCT: 41.9 % (ref 39.0–52.0)
Hemoglobin: 13.7 g/dL (ref 13.0–17.0)
MCH: 31.1 pg (ref 26.0–34.0)
MCHC: 32.7 g/dL (ref 30.0–36.0)
MCV: 95 fL (ref 78.0–100.0)
Platelets: 233 10*3/uL (ref 150–400)
RBC: 4.41 MIL/uL (ref 4.22–5.81)
RDW: 13.8 % (ref 11.5–15.5)
WBC: 10.4 10*3/uL (ref 4.0–10.5)

## 2016-04-09 LAB — APTT: aPTT: 28 seconds (ref 24–36)

## 2016-04-09 LAB — GLUCOSE, CAPILLARY: Glucose-Capillary: 220 mg/dL — ABNORMAL HIGH (ref 65–99)

## 2016-04-09 LAB — PROTIME-INR
INR: 1.02
Prothrombin Time: 13.5 seconds (ref 11.4–15.2)

## 2016-04-09 MED ORDER — GADOBENATE DIMEGLUMINE 529 MG/ML IV SOLN
20.0000 mL | Freq: Once | INTRAVENOUS | Status: AC
  Administered 2016-04-09: 20 mL via INTRAVENOUS

## 2016-04-09 MED ORDER — LIDOCAINE HCL 1 % IJ SOLN
INTRAMUSCULAR | Status: AC | PRN
  Administered 2016-04-09: 20 mL

## 2016-04-09 MED ORDER — SODIUM CHLORIDE 0.9 % IV SOLN
INTRAVENOUS | Status: DC
  Administered 2016-04-09: 08:00:00 via INTRAVENOUS

## 2016-04-09 MED ORDER — FENTANYL CITRATE (PF) 100 MCG/2ML IJ SOLN
INTRAMUSCULAR | Status: AC | PRN
Start: 2016-04-09 — End: 2016-04-09
  Administered 2016-04-09 (×2): 25 ug via INTRAVENOUS

## 2016-04-09 MED ORDER — CEFAZOLIN SODIUM-DEXTROSE 2-4 GM/100ML-% IV SOLN: 2.0000 g | INTRAVENOUS | Status: DC

## 2016-04-09 MED ORDER — HEPARIN SOD (PORK) LOCK FLUSH 100 UNIT/ML IV SOLN
INTRAVENOUS | Status: AC
  Filled 2016-04-09: qty 5

## 2016-04-09 MED ORDER — CEFAZOLIN SODIUM-DEXTROSE 2-4 GM/100ML-% IV SOLN
INTRAVENOUS | Status: AC
  Filled 2016-04-09: qty 100

## 2016-04-09 MED ORDER — FENTANYL CITRATE (PF) 100 MCG/2ML IJ SOLN
INTRAMUSCULAR | Status: AC
  Filled 2016-04-09: qty 4

## 2016-04-09 MED ORDER — MIDAZOLAM HCL 2 MG/2ML IJ SOLN
INTRAMUSCULAR | Status: AC | PRN
  Administered 2016-04-09: 1 mg via INTRAVENOUS
  Administered 2016-04-09: 0.5 mg via INTRAVENOUS

## 2016-04-09 MED ORDER — MIDAZOLAM HCL 2 MG/2ML IJ SOLN
INTRAMUSCULAR | Status: AC
  Filled 2016-04-09: qty 6

## 2016-04-09 MED ORDER — LIDOCAINE HCL 1 % IJ SOLN
INTRAMUSCULAR | Status: AC
  Filled 2016-04-09: qty 20

## 2016-04-09 NOTE — Consult Note (Signed)
Chief Complaint: Patient was seen in consultation today for port a cath placement  Referring Physician(s): Zhou,Louise  Supervising Physician: [name]  Patient Status: [facility] - Out-pt  History of Present Illness: Chase Herrera is a 66 y.o. male former smoker with COPD and recently diagnosed small cell carcinoma of right lung who presents today for port a cath placement for chemotherapy. He was admitted for overnight observation at [facility] this past weekend for COPD exacerbation. Additional PMH as below.   Past Medical History:  Diagnosis Date  . Alcohol abuse    Quit 08/23/11  . Arthritis   . Atrial flutter [facility])    s/p CTI ablation by Dr [name] 10/13  . Cardiomyopathy ([place])    LVEF 25-30% 8/13, previously normal by dobutamine echo 5/13  . COPD (chronic obstructive pulmonary disease) ([place])   . Depression   . Essential hypertension, benign   . Prostate enlargement   . Type 2 diabetes mellitus ([place])     Past Surgical History:  Procedure Laterality Date  . APPENDECTOMY    . ATRIAL ABLATION SURGERY  11/06/2011   CTI ablation for atrial flutter by Dr [name]  . ATRIAL FLUTTER ABLATION N/A 11/06/2011   Procedure: ATRIAL FLUTTER ABLATION;  Surgeon: Thompson Grayer, MD;  Location: [facility] CATH LAB;  Service: Cardiovascular;  Laterality: N/A;  . Cataracts    . TONSILLECTOMY      Allergies: Codeine  Medications: Prior to Admission medications   Medication Sig Start Date End Date Taking? Authorizing Provider  albuterol (PROVENTIL HFA;VENTOLIN HFA) 108 (90 BASE) MCG/ACT inhaler Inhale 2 puffs into the lungs every 6 (six) hours as needed. For shortness of breath   Yes Historical Provider, MD  aspirin EC 81 MG tablet Take 81 mg by mouth daily.   Yes Historical Provider, MD  carvedilol (COREG) 12.5 MG tablet Take 12.5 mg by mouth 2 (two) times daily with a meal.    Yes Historical Provider, MD  diltiazem (CARDIZEM CD) 240 MG 24 hr capsule Take 240 mg by mouth. 12/13/15  Yes  Historical Provider, MD  furosemide (LASIX) 40 MG tablet Take 40 mg by mouth daily.   Yes Historical Provider, MD  gabapentin (NEURONTIN) 300 MG capsule Take by mouth.   Yes Historical Provider, MD  insulin glargine (LANTUS) 100 UNIT/ML injection Inject 25 Units into the skin 2 (two) times daily.   Yes Historical Provider, MD  ipratropium-albuterol (DUONEB) 0.5-2.5 (3) MG/3ML SOLN Take 3 mLs by nebulization every 6 (six) hours as needed (shortness of breath/wheezing).   Yes Historical Provider, MD  LORazepam (ATIVAN) 1 MG tablet Take 1 mg by mouth 3 (three) times daily.   Yes Historical Provider, MD  metformin (FORTAMET) 500 MG (OSM) 24 hr tablet Take 1,000 mg by mouth.   Yes Historical Provider, MD  Multiple Vitamin (MULTIVITAMIN) capsule Take by mouth.   Yes Historical Provider, MD  oxyCODONE-acetaminophen (PERCOCET/ROXICET) 5-325 MG tablet Take 1-2 tablets by mouth every 4 (four) hours as needed for severe pain. 04/04/16  Yes Twana First, MD  tamsulosin (FLOMAX) 0.4 MG CAPS capsule Take by mouth. 08/25/13  Yes Historical Provider, MD  Rivaroxaban (XARELTO) 15 MG TABS tablet Take 15 mg by mouth. 12/03/15   Historical Provider, MD     Family History  Problem Relation Age of Onset  . Diabetes Sister   . Hypertension Mother   . Cancer Mother     lung  . Hypertension Father   . Heart attack Father   .  Hypertension Sister   . Hypertension Sister   . Hypertension Sister   . Pulmonary fibrosis Sister     Social History   Social History  . Marital status: Single    Spouse name: N/A  . Number of children: N/A  . Years of education: N/A   Occupational History  . retired      Building services engineer   Social History Main Topics  . Smoking status: Current Every Day Smoker    Packs/day: 0.50    Years: 47.00    Types: Cigarettes  . Smokeless tobacco: Never Used     Comment: encouraged to quit today 03/04/12  . Alcohol use Yes     Comment: quit 2 years ago  . Drug use: No     Comment: marajuana  occasionally  . Sexual activity: Not Currently   Other Topics Concern  . None   Social History Narrative   Lives in [place] alone.   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 Chest with distant breath sounds bilaterally. Heart with regular rate and rhythm. Abdomen obese, soft, positive bowel sounds, nontender. Lower extremities with 2-3+ edema bilaterally.  Mallampati Score:     Imaging: No results found.  Labs:  CBC:  Recent Labs  11/15/15 2305 04/09/16 0734  WBC 17.0* 10.4  HGB 14.3 13.7  HCT 43.9 41.9  PLT 193 233    COAGS: No results for input(s): INR, APTT in the last 8760 hours.  BMP:  Recent Labs  11/15/15 2305  NA 133*  K 4.0  CL 102  CO2 24  GLUCOSE 299*  BUN 35*  CALCIUM 9.0  CREATININE 1.29*  GFRNONAA 57*  GFRAA >60    LIVER FUNCTION TESTS:  Recent Labs  11/15/15 2305  BILITOT 0.4  AST 19  ALT 28  ALKPHOS 50  PROT 6.6  ALBUMIN 3.8    TUMOR MARKERS: No results for input(s): AFPTM, CEA, CA199, CHROMGRNA in the last 8760 hours.  Assessment and Plan: 66 y.o. male former smoker with COPD and recently diagnosed small cell carcinoma of right lung who presents today for port a cath placement for chemotherapy. He was admitted for overnight observation at [facility] this past weekend for COPD exacerbation. PE was not ruled out and he states he is supposed to return to [place]/primary care MD for f/u CT following port placement. Risks and benefits discussed with the patient/family  including, but not limited to bleeding, infection, pneumothorax, or fibrin sheath  development and need for additional procedures.All of the patient's questions were answered, patient is agreeable to proceed.Consent signed and in chart.     Thank you for this interesting consult.  I greatly enjoyed meeting DANN GALICIA and look forward to participating in their care.  A copy of this report was sent to the requesting provider on this date.  Electronically Signed: D. Rowe Robert 04/09/2016, 8:29 AM   I spent a total of  25 minutes   in face to face in clinical consultation, greater  than 50% of which was counseling/coordinating care for port a cath placement

## 2016-04-09 NOTE — Procedures (Signed)
Interventional Radiology Procedure Note  Procedure: Placement of a right IJ approach single lumen PowerPort.  Tip is positioned at the superior cavoatrial junction and catheter is ready for immediate use.  Complications: None Recommendations:  - Ok to shower tomorrow - Do not submerge for 7 days - Routine line care   Signed,  Donnamaria Shands S. Greig Altergott, DO   

## 2016-04-09 NOTE — Discharge Instructions (Addendum)
Moderate Conscious Sedation, Adult, Care After °These instructions provide you with information about caring for yourself after your procedure. Your health care provider may also give you more specific instructions. Your treatment has been planned according to current medical practices, but problems sometimes occur. Call your health care provider if you have any problems or questions after your procedure. °What can I expect after the procedure? °After your procedure, it is common: °· To feel sleepy for several hours. °· To feel clumsy and have poor balance for several hours. °· To have poor judgment for several hours. °· To vomit if you eat too soon. °Follow these instructions at home: °For at least 24 hours after the procedure:  ° °· Do not: °¨ Participate in activities where you could fall or become injured. °¨ Drive. °¨ Use heavy machinery. °¨ Drink alcohol. °¨ Take sleeping pills or medicines that cause drowsiness. °¨ Make important decisions or sign legal documents. °¨ Take care of children on your own. °· Rest. °Eating and drinking  °· Follow the diet recommended by your health care provider. °· If you vomit: °¨ Drink water, juice, or soup when you can drink without vomiting. °¨ Make sure you have little or no nausea before eating solid foods. °General instructions  °· Have a responsible adult stay with you until you are awake and alert. °· Take over-the-counter and prescription medicines only as told by your health care provider. °· If you smoke, do not smoke without supervision. °· Keep all follow-up visits as told by your health care provider. This is important. °Contact a health care provider if: °· You keep feeling nauseous or you keep vomiting. °· You feel light-headed. °· You develop a rash. °· You have a fever. °Get help right away if: °· You have trouble breathing. °This information is not intended to replace advice given to you by your health care provider. Make sure you discuss any questions you  have with your health care provider. °Document Released: 10/29/2012 Document Revised: 06/13/2015 Document Reviewed: 04/30/2015 °Elsevier Interactive Patient Education © 2017 Elsevier Inc. ° ° °Implanted Port Insertion, Care After °This sheet gives you information about how to care for yourself after your procedure. Your health care provider may also give you more specific instructions. If you have problems or questions, contact your health care provider. °What can I expect after the procedure? °After your procedure, it is common to have: °· Discomfort at the port insertion site. °· Bruising on the skin over the port. This should improve over 3-4 days. °Follow these instructions at home: °Port care  °· After your port is placed, you will get a manufacturer's information card. The card has information about your port. Keep this card with you at all times. °· Take care of the port as told by your health care provider. Ask your health care provider if you or a family member can get training for taking care of the port at home. A home health care nurse may also take care of the port. °· Make sure to remember what type of port you have. °Incision care  °· Follow instructions from your health care provider about how to take care of your port insertion site. Make sure you: °¨ Wash your hands with soap and water before you change your bandage (dressing). If soap and water are not available, use hand sanitizer. °¨ Change your dressing as told by your health care provider. °¨ Leave stitches (sutures), skin glue, or adhesive strips in place. These skin   closures may need to stay in place for 2 weeks or longer. If adhesive strip edges start to loosen and curl up, you may trim the loose edges. Do not remove adhesive strips completely unless your health care provider tells you to do that. °· Check your port insertion site every day for signs of infection. Check for: °¨ More redness, swelling, or pain. °¨ More fluid or  blood. °¨ Warmth. °¨ Pus or a bad smell. °General instructions  °· Do not take baths, swim, or use a hot tub until your health care provider approves. °· Do not lift anything that is heavier than 10 lb (4.5 kg) for a week, or as told by your health care provider. °· Ask your health care provider when it is okay to: °¨ Return to work or school. °¨ Resume usual physical activities or sports. °· Do not drive for 24 hours if you were given a medicine to help you relax (sedative). °· Take over-the-counter and prescription medicines only as told by your health care provider. °· Wear a medical alert bracelet in case of an emergency. This will tell any health care providers that you have a port. °· Keep all follow-up visits as told by your health care provider. This is important. °Contact a health care provider if: °· You cannot flush your port with saline as directed, or you cannot draw blood from the port. °· You have a fever or chills. °· You have more redness, swelling, or pain around your port insertion site. °· You have more fluid or blood coming from your port insertion site. °· Your port insertion site feels warm to the touch. °· You have pus or a bad smell coming from the port insertion site. °Get help right away if: °· You have chest pain or shortness of breath. °· You have bleeding from your port that you cannot control. °Summary °· Take care of the port as told by your health care provider. °· Change your dressing as told by your health care provider. °· Keep all follow-up visits as told by your health care provider. °This information is not intended to replace advice given to you by your health care provider. Make sure you discuss any questions you have with your health care provider. °Document Released: 10/29/2012 Document Revised: 11/30/2015 Document Reviewed: 11/30/2015 °Elsevier Interactive Patient Education © 2017 Elsevier Inc. ° °

## 2016-04-10 DIAGNOSIS — J440 Chronic obstructive pulmonary disease with acute lower respiratory infection: Secondary | ICD-10-CM | POA: Diagnosis not present

## 2016-04-13 ENCOUNTER — Ambulatory Visit (HOSPITAL_COMMUNITY)
Admission: RE | Admit: 2016-04-13 | Discharge: 2016-04-13 | Disposition: A | Discharge status: Home / Self Care | Payer: Medicare Other | Source: Ambulatory Visit | Attending: Oncology | Admitting: Oncology

## 2016-04-13 DIAGNOSIS — C3491 Malignant neoplasm of unspecified part of right bronchus or lung: Secondary | ICD-10-CM | POA: Insufficient documentation

## 2016-04-13 DIAGNOSIS — K5730 Diverticulosis of large intestine without perforation or abscess without bleeding: Secondary | ICD-10-CM | POA: Insufficient documentation

## 2016-04-13 DIAGNOSIS — J9811 Atelectasis: Secondary | ICD-10-CM | POA: Insufficient documentation

## 2016-04-13 DIAGNOSIS — I2510 Atherosclerotic heart disease of native coronary artery without angina pectoris: Secondary | ICD-10-CM | POA: Insufficient documentation

## 2016-04-13 DIAGNOSIS — R590 Localized enlarged lymph nodes: Secondary | ICD-10-CM | POA: Diagnosis not present

## 2016-04-13 DIAGNOSIS — I6522 Occlusion and stenosis of left carotid artery: Secondary | ICD-10-CM | POA: Insufficient documentation

## 2016-04-13 DIAGNOSIS — J439 Emphysema, unspecified: Secondary | ICD-10-CM | POA: Diagnosis not present

## 2016-04-13 DIAGNOSIS — J320 Chronic maxillary sinusitis: Secondary | ICD-10-CM | POA: Insufficient documentation

## 2016-04-13 LAB — GLUCOSE, CAPILLARY: Glucose-Capillary: 212 mg/dL — ABNORMAL HIGH (ref 65–99)

## 2016-04-13 MED ORDER — FLUDEOXYGLUCOSE F - 18 (FDG) INJECTION
14.8400 | Freq: Once | INTRAVENOUS | Status: AC | PRN
  Administered 2016-04-13: 14.84 via INTRAVENOUS

## 2016-04-15 NOTE — Patient Instructions (Addendum)
[place]   CHEMOTHERAPY INSTRUCTIONS   We are treating your Small Cell Lung Cancer with Cisplatin and Etoposide. You have Stage IIIc SCLC (small cell lung cancer). The goal of treatment is for curative intent. Plan to be here for 3-4 hours on Day 1 and 2-3 hours on Days 2-3.    Cisplatin - this medication is hard on your kidneys. This is why we give you a large amount of fluids through your IV while you are here getting chemo. We also need you drinking 64 oz of fluid (preferably water/decaff fluids) 2 days prior to chemo and for up to 4-5 days after chemo. Drink more if you can. This will help keep your kidneys flushed. This can also cause acute and/or delayed nausea/vomiting. You must take your nausea meds as prescribed if you get nauseated. Do not wait until you start vomiting. This med can also cause peripheral neuropathy (numbness/tingling/burning in hands/fingers/feet/toes). Let us know if this develops so that we can monitor it and treat if necessary. (Takes 1 hour to infuse)   VP16 (Etoposide) - this medication can lower your blood pressure. We will monitor this during chemo. Bone marrow suppression (lowers white blood cells (fight infection), lowers red blood cells (make up your blood), lower platelets (help blood to clot). Hair loss, loss of appetite. (takes 1 hour to infuse)   Neulasta - this medication is not chemo but being given because you have had chemo. It is usually given 24-27 hours after the completion of chemotherapy. This medication works by boosting your bone marrow's supply of white blood cells. White blood cells are what protect our bodies against infection. The medication is given in the form of a subcutaneous injection. It is given in the fatty tissue of your abdomen. It is a short needle. The major side effect of this medication is bone or muscle pain. The drug of choice to relieve or lessen the pain is Aleve or Ibuprofen. If a physician  has ever told you not to take Aleve or Ibuprofen - then don't take it. You should then take Tylenol/acetaminophen. Take either medication as the bottle directs you to.  The level of pain you experience as a result of this injection can range from none, to mild or moderate, or severe. Please let us know if you develop moderate or severe bone pain.       POTENTIAL SIDE EFFECTS OF TREATMENT: Increased Susceptibility to Infection, Vomiting, Constipation, Hair Thinning, Changes in Character of Skin and Nails (brittleness, dryness,etc.), Bone Marrow Suppression, Complete Hair Loss, Nausea, Diarrhea, Sun Sensitivity and Mouth Sores    EDUCATIONAL MATERIALS GIVEN AND REVIEWED: Chemotherapy and You book  Specific Instructions Sheets: (Please refer to/review other teaching materials that have been provided to you in this blue folder - What to Know During Chemo, What to know After Chemo, General Advice about Chemo, Constipation Sheet, Diarrhea Sheet, Nausea Sheet, Self Care Activities While on Chemo)   SELF CARE ACTIVITIES WHILE ON CHEMOTHERAPY: Increase your fluid intake 48 hours prior to treatment and drink at least 2 quarts per day after treatment., No alcohol intake., No aspirin or other medications unless approved by your oncologist., Eat foods that are light and easy to digest., Eat foods at cold or room temperature., No fried, fatty, or spicy foods immediately before or after treatment., Have teeth cleaned professionally before starting treatment. Keep dentures and partial plates clean., Use soft toothbrush and do not use mouthwashes that contain alcohol. Biotene is  a good mouthwash that is available at most pharmacies or may be ordered by calling [phone]., Use warm salt water gargles (1 teaspoon salt per 1 quart warm water) before and after meals and at bedtime. Or you may rinse with 2 tablespoons of three -percent hydrogen peroxide mixed in eight ounces of water., Always use sunscreen with SPF  (Sun Protection Factor) of 30 or higher., Use your nausea medication as directed to prevent nausea., Use your stool softener or laxative as directed to prevent constipation. and Use your anti-diarrheal medication as directed to stop diarrhea.   MEDICATIONS: You have been given prescriptions for the following medications:  Zofran/Ondansetron '8mg'$  tablet. Take 1 tablet every 8 hours as needed for nausea/vomiting. (#1 nausea med to take, this can constipate)  Compazine/Prochlorperazine '10mg'$  tablet. Take 1 tablet every 6 hours as needed for nausea/vomiting. (#2 nausea med to take, this can make you sleepy)   EMLA cream. Apply a quarter size amount to port site 1 hour prior to chemo. Do not rub in. Cover with plastic wrap.   Over-the-Counter Meds:  Miralax 1 capful in 8 oz of fluid daily. May increase to two times a day if needed. This is a stool softener. If this doesn't work proceed you can add:  Senokot S  - start with 1 tablet two times a day and increase to 4 tablets two times a day if needed. (total of 8 tablets in a 24 hour period). This is a stimulant laxative.   Call us if this does not help your bowels move.   Imodium '2mg'$  capsule. Take 2 capsules after the 1st loose stool and then 1 capsule every 2 hours until you go a total of 12 hours without having a loose stool. Call the [facility] if loose stools continue. If diarrhea occurs @ bedtime, take 2 capsules @ bedtime. Then take 2 capsules every 4 hours until morning. Call [facility].   SYMPTOMS TO REPORT AS SOON AS POSSIBLE AFTER TREATMENT:  FEVER GREATER THAN 100.5 F  CHILLS WITH OR WITHOUT FEVER  NAUSEA AND VOMITING THAT IS NOT CONTROLLED WITH YOUR NAUSEA MEDICATION  UNUSUAL SHORTNESS OF BREATH  UNUSUAL BRUISING OR BLEEDING  TENDERNESS IN MOUTH AND THROAT WITH OR WITHOUT PRESENCE OF ULCERS  URINARY PROBLEMS  BOWEL PROBLEMS  UNUSUAL RASH    Wear comfortable clothing and clothing appropriate for easy access to  any Portacath or PICC line. Let us know if there is anything that we can do to make your therapy better!      I have been informed and understand all of the instructions given to me and have received a copy. I have been instructed to call the clinic [phone] or my family physician as soon as possible for continued medical care, if indicated. I do not have any more questions at this time but understand that I may call the [facility] or the Patient Navigator at [phone] during office hours should I have questions or need assistance in obtaining follow-up care.

## 2016-04-16 ENCOUNTER — Encounter (HOSPITAL_BASED_OUTPATIENT_CLINIC_OR_DEPARTMENT_OTHER): Payer: Medicare Other | Admitting: Oncology

## 2016-04-16 ENCOUNTER — Encounter (HOSPITAL_BASED_OUTPATIENT_CLINIC_OR_DEPARTMENT_OTHER): Payer: Medicare Other

## 2016-04-16 ENCOUNTER — Other Ambulatory Visit (HOSPITAL_COMMUNITY): Payer: Self-pay | Admitting: Oncology

## 2016-04-16 ENCOUNTER — Other Ambulatory Visit (HOSPITAL_COMMUNITY): Payer: Medicare Other

## 2016-04-16 ENCOUNTER — Ambulatory Visit (HOSPITAL_COMMUNITY): Payer: Medicare Other

## 2016-04-16 ENCOUNTER — Encounter (HOSPITAL_COMMUNITY): Payer: Self-pay

## 2016-04-16 ENCOUNTER — Encounter (HOSPITAL_COMMUNITY): Payer: Medicare Other

## 2016-04-16 VITALS — BP 118/60 | HR 65 | Temp 97.8°F | Resp 20 | Wt 301.0 lb

## 2016-04-16 VITALS — BP 152/72 | HR 72 | Temp 97.9°F | Resp 20

## 2016-04-16 DIAGNOSIS — I480 Paroxysmal atrial fibrillation: Secondary | ICD-10-CM | POA: Diagnosis not present

## 2016-04-16 DIAGNOSIS — J449 Chronic obstructive pulmonary disease, unspecified: Secondary | ICD-10-CM | POA: Diagnosis not present

## 2016-04-16 DIAGNOSIS — Z5111 Encounter for antineoplastic chemotherapy: Secondary | ICD-10-CM | POA: Diagnosis present

## 2016-04-16 DIAGNOSIS — F1010 Alcohol abuse, uncomplicated: Secondary | ICD-10-CM | POA: Diagnosis not present

## 2016-04-16 DIAGNOSIS — F329 Major depressive disorder, single episode, unspecified: Secondary | ICD-10-CM | POA: Diagnosis not present

## 2016-04-16 DIAGNOSIS — Z7982 Long term (current) use of aspirin: Secondary | ICD-10-CM | POA: Diagnosis not present

## 2016-04-16 DIAGNOSIS — C3411 Malignant neoplasm of upper lobe, right bronchus or lung: Secondary | ICD-10-CM

## 2016-04-16 DIAGNOSIS — C3491 Malignant neoplasm of unspecified part of right bronchus or lung: Secondary | ICD-10-CM | POA: Diagnosis not present

## 2016-04-16 DIAGNOSIS — I4892 Unspecified atrial flutter: Secondary | ICD-10-CM | POA: Diagnosis not present

## 2016-04-16 DIAGNOSIS — I429 Cardiomyopathy, unspecified: Secondary | ICD-10-CM | POA: Diagnosis not present

## 2016-04-16 DIAGNOSIS — Z87891 Personal history of nicotine dependence: Secondary | ICD-10-CM | POA: Diagnosis not present

## 2016-04-16 DIAGNOSIS — E119 Type 2 diabetes mellitus without complications: Secondary | ICD-10-CM | POA: Diagnosis not present

## 2016-04-16 DIAGNOSIS — Z794 Long term (current) use of insulin: Secondary | ICD-10-CM | POA: Diagnosis not present

## 2016-04-16 DIAGNOSIS — Z9889 Other specified postprocedural states: Secondary | ICD-10-CM | POA: Diagnosis not present

## 2016-04-16 LAB — CBC WITH DIFFERENTIAL/PLATELET
Basophils Absolute: 0.1 10*3/uL (ref 0.0–0.1)
Basophils Relative: 1 %
Eosinophils Absolute: 0.2 10*3/uL (ref 0.0–0.7)
Eosinophils Relative: 2 %
HCT: 45.7 % (ref 39.0–52.0)
Hemoglobin: 15.1 g/dL (ref 13.0–17.0)
Lymphocytes Relative: 23 %
Lymphs Abs: 1.7 10*3/uL (ref 0.7–4.0)
MCH: 32.3 pg (ref 26.0–34.0)
MCHC: 33 g/dL (ref 30.0–36.0)
MCV: 97.9 fL (ref 78.0–100.0)
Monocytes Absolute: 1 10*3/uL (ref 0.1–1.0)
Monocytes Relative: 13 %
Neutro Abs: 4.6 10*3/uL (ref 1.7–7.7)
Neutrophils Relative %: 61 %
Platelets: 208 10*3/uL (ref 150–400)
RBC: 4.67 MIL/uL (ref 4.22–5.81)
RDW: 13.8 % (ref 11.5–15.5)
WBC: 7.6 10*3/uL (ref 4.0–10.5)

## 2016-04-16 LAB — COMPREHENSIVE METABOLIC PANEL
ALT: 25 U/L (ref 17–63)
AST: 23 U/L (ref 15–41)
Albumin: 3.9 g/dL (ref 3.5–5.0)
Alkaline Phosphatase: 48 U/L (ref 38–126)
Anion gap: 10 (ref 5–15)
BUN: 25 mg/dL — ABNORMAL HIGH (ref 6–20)
CO2: 29 mmol/L (ref 22–32)
Calcium: 9 mg/dL (ref 8.9–10.3)
Chloride: 98 mmol/L — ABNORMAL LOW (ref 101–111)
Creatinine, Ser: 1.24 mg/dL (ref 0.61–1.24)
GFR calc Af Amer: >60 mL/min (ref >60)
GFR calc non Af Amer: >60 mL/min (ref >60)
Glucose, Bld: 179 mg/dL — ABNORMAL HIGH (ref 65–99)
Potassium: 4.1 mmol/L (ref 3.5–5.1)
Sodium: 137 mmol/L (ref 135–145)
Total Bilirubin: 0.6 mg/dL (ref 0.3–1.2)
Total Protein: 6.7 g/dL (ref 6.5–8.1)

## 2016-04-16 MED ORDER — ONDANSETRON HCL 8 MG PO TABS: 8.0000 mg | ORAL_TABLET | Freq: Two times a day (BID) | ORAL | 1 refills | Status: DC | PRN

## 2016-04-16 MED ORDER — SODIUM CHLORIDE 0.9% FLUSH
10.0000 mL | INTRAVENOUS | Status: DC | PRN
  Administered 2016-04-16: 10 mL
  Filled 2016-04-16: qty 10

## 2016-04-16 MED ORDER — HEPARIN SOD (PORK) LOCK FLUSH 100 UNIT/ML IV SOLN
500.0000 [IU] | Freq: Once | INTRAVENOUS | Status: AC | PRN
  Administered 2016-04-16: 500 [IU]
  Filled 2016-04-16: qty 5

## 2016-04-16 MED ORDER — SODIUM CHLORIDE 0.9 % IV SOLN
Freq: Once | INTRAVENOUS | Status: AC
  Administered 2016-04-16: 12:00:00 via INTRAVENOUS

## 2016-04-16 MED ORDER — DEXAMETHASONE 4 MG PO TABS: ORAL_TABLET | ORAL | 1 refills | Status: DC

## 2016-04-16 MED ORDER — LIDOCAINE-PRILOCAINE 2.5-2.5 % EX CREA: TOPICAL_CREAM | CUTANEOUS | 3 refills | Status: DC

## 2016-04-16 MED ORDER — LORAZEPAM 0.5 MG PO TABS: 0.5000 mg | ORAL_TABLET | Freq: Four times a day (QID) | ORAL | 0 refills | Status: DC | PRN

## 2016-04-16 MED ORDER — PROCHLORPERAZINE MALEATE 10 MG PO TABS: 10.0000 mg | ORAL_TABLET | Freq: Four times a day (QID) | ORAL | 1 refills | Status: DC | PRN

## 2016-04-16 MED ORDER — SODIUM CHLORIDE 0.9 % IV SOLN
120.0000 mg/m2 | Freq: Once | INTRAVENOUS | Status: AC
  Administered 2016-04-16: 320 mg via INTRAVENOUS
  Filled 2016-04-16: qty 16

## 2016-04-16 MED ORDER — SODIUM CHLORIDE 0.9 % IV SOLN: 10.0000 mg | Freq: Once | INTRAVENOUS | Status: DC

## 2016-04-16 MED ORDER — DEXAMETHASONE SODIUM PHOSPHATE 10 MG/ML IJ SOLN
10.0000 mg | Freq: Once | INTRAMUSCULAR | Status: AC
  Administered 2016-04-16: 10 mg via INTRAVENOUS
  Filled 2016-04-16: qty 1

## 2016-04-16 NOTE — Progress Notes (Signed)
[place]  Follow Up Progress Note  Patient Care Team: Neale Burly, MD as PCP - General (Internal Medicine)  CHIEF COMPLAINTS/PURPOSE OF CONSULTATION:  Small cell carcinoma of RUL  HISTORY OF PRESENTING ILLNESS:     Small cell lung cancer ([place])   04/03/2016 Initial Diagnosis    Small cell lung cancer ([place])     04/09/2016 Imaging    MRI brain- Negative for metastatic disease to the brain. No acute abnormality.      04/09/2016 Procedure    Right IJ port catheter placement by IR      04/13/2016 PET scan    . The right apical lung mass has significantly enlarged and there is new right paratracheal adenopathy as well as a hypermetabolic new lymph node in the right inner infraclavicular fossa potentially with early impingement on adjacent neurovascular structures. 2. Other imaging findings of potential clinical significance: Chronic bilateral maxillary sinusitis. Left carotid atherosclerotic calcification. Aortoiliac atherosclerotic vascular disease. Coronary atherosclerosis. Paraseptal emphysema. Mild atelectasis in the lung bases. Scattered sigmoid colon diverticula. Probable right pars defect at L5.       Chase Herrera 66 y.o. male previous smoker with a history of severe COPD on 2L O2, PAF (on Xarelto), and DM who is here because of a RUL mass. The patient was evaluated by Dr. Lianne Moris in CT surgery, who noticed a RUL mass. A CT of the chest was done and showed a 4 cm hypermetabolic (SUV 99.3) RUL mass that was invading rib #1 and rib #2 posterolaterally on PET. On 03/22/16 patient underwent a bronchoscopy with right VATS and biopsy of RUL mass. Surgical path from biopsy showed high grade neuroendocrine carcinoma, most consistent with small cell carcinoma.   The patient is accompanied by family today. He reports sleeping well recently, other than waking up for nebulizer treatments for COPD. The patient is without complaint overall at this time. Recent PET scan  on 04/13/16 revealed enlargement of the right apical lung mass with new lymphadenopathy with right paratracheal lymphadenopathy and new right inner infraclavicular fossa potentially with early impingement on adjacent neurovascular structures, otherwise no distant metastatic disease. Brain MRI on 04/09/16 was clear for evidence of metastatic disease.  MEDICAL HISTORY:  Past Medical History:  Diagnosis Date  . Alcohol abuse    Quit 08/23/11  . Arthritis   . Atrial fibrillation ([place])   . Atrial flutter [facility])    s/p CTI ablation by Dr [name] 10/13  . Cancer ([place])    lung cancer  . Cardiomyopathy ([place])    LVEF 25-30% 8/13, previously normal by dobutamine echo 5/13  . Chest pain   . CHF (congestive heart failure) ([place])   . COPD (chronic obstructive pulmonary disease) ([place])   . Depression   . Dyspnea   . Essential hypertension, benign   . Obesity   . Prostate enlargement   . Type 2 diabetes mellitus ([place])     SURGICAL HISTORY: Past Surgical History:  Procedure Laterality Date  . APPENDECTOMY    . ATRIAL ABLATION SURGERY  11/06/2011   CTI ablation for atrial flutter by Dr [name]  . ATRIAL FLUTTER ABLATION N/A 11/06/2011   Procedure: ATRIAL FLUTTER ABLATION;  Surgeon: Thompson Grayer, MD;  Location: [facility] CATH LAB;  Service: Cardiovascular;  Laterality: N/A;  . Cataracts    . IR GENERIC HISTORICAL  04/09/2016   IR US GUIDE VASC ACCESS RIGHT 04/09/2016 [name], DO [facility]-INTERV RAD  . IR GENERIC HISTORICAL  04/09/2016  IR FLUORO GUIDE PORT INSERTION RIGHT 04/09/2016 [name], DO [facility]  . TONSILLECTOMY      SOCIAL HISTORY: Social History   Social History  . Marital status: Single    Spouse name: N/A  . Number of children: N/A  . Years of education: N/A   Occupational History  . retired      Building services engineer   Social History Main Topics  . Smoking status: Former Smoker    Packs/day: 0.50    Years: 47.00    Types: Cigarettes  . Smokeless tobacco: Former Systems developer    Quit date:  04/10/2014     Comment: encouraged to quit today 03/04/12  . Alcohol use Yes     Comment: quit 2 years ago  . Drug use: No     Comment: marajuana occasionally  . Sexual activity: Not Currently   Other Topics Concern  . Not on file   Social History Narrative   Lives in [place] alone.   Disabled due to COPD   Previousliy worked as a Electrical engineer    FAMILY HISTORY: Family History  Problem Relation Age of Onset  . Diabetes Sister   . Hypertension Mother   . Cancer Mother     lung  . Hypertension Father   . Heart attack Father   . Hypertension Sister   . Hypertension Sister   . Hypertension Sister   . Pulmonary fibrosis Sister     ALLERGIES:  is allergic to codeine.  MEDICATIONS:  Current Outpatient Prescriptions  Medication Sig Dispense Refill  . albuterol (PROVENTIL HFA;VENTOLIN HFA) 108 (90 BASE) MCG/ACT inhaler Inhale 2 puffs into the lungs every 6 (six) hours as needed. For shortness of breath    . aspirin EC 81 MG tablet Take 81 mg by mouth daily.    . carvedilol (COREG) 12.5 MG tablet Take 12.5 mg by mouth 2 (two) times daily with a meal.     . dexamethasone (DECADRON) 4 MG tablet Take 2 tablets two times a day for 1 day on day 4 after cisplatin chemotherapy. Take with food. 30 tablet 1  . diltiazem (CARDIZEM CD) 240 MG 24 hr capsule Take 240 mg by mouth.    . furosemide (LASIX) 40 MG tablet Take 40 mg by mouth daily.    Marland Kitchen gabapentin (NEURONTIN) 300 MG capsule Take by mouth.    . insulin glargine (LANTUS) 100 UNIT/ML injection Inject 25 Units into the skin 2 (two) times daily.    Marland Kitchen ipratropium-albuterol (DUONEB) 0.5-2.5 (3) MG/3ML SOLN Take 3 mLs by nebulization every 6 (six) hours as needed (shortness of breath/wheezing).    Marland Kitchen lidocaine-prilocaine (EMLA) cream Apply to affected area once 30 g 3  . LORazepam (ATIVAN) 0.5 MG tablet Take 1 tablet (0.5 mg total) by mouth every 6 (six) hours as needed (Nausea or vomiting). 30 tablet 0  . LORazepam (ATIVAN) 1 MG  tablet Take 1 mg by mouth 3 (three) times daily.    . metformin (FORTAMET) 500 MG (OSM) 24 hr tablet Take 1,000 mg by mouth.    . Multiple Vitamin (MULTIVITAMIN) capsule Take by mouth.    . ondansetron (ZOFRAN) 8 MG tablet Take 1 tablet (8 mg total) by mouth 2 (two) times daily as needed. Start on the third day after cisplatin chemotherapy. 30 tablet 1  . oxyCODONE-acetaminophen (PERCOCET/ROXICET) 5-325 MG tablet Take 1-2 tablets by mouth every 4 (four) hours as needed for severe pain. 90 tablet 0  . prochlorperazine (COMPAZINE) 10 MG  tablet Take 1 tablet (10 mg total) by mouth every 6 (six) hours as needed (Nausea or vomiting). 30 tablet 1  . Rivaroxaban (XARELTO) 15 MG TABS tablet Take 15 mg by mouth.    . tamsulosin (FLOMAX) 0.4 MG CAPS capsule Take by mouth.     No current facility-administered medications for this visit.    Review of Systems  Constitutional: Negative.   HENT: Negative.   Eyes: Negative.   Respiratory: Positive for shortness of breath (exertion).        COPD  Cardiovascular: Positive for chest pain.  Gastrointestinal: Negative.   Genitourinary: Negative.   Musculoskeletal: Negative.   Skin: Negative.   Neurological: Negative.  Negative for headaches.  Endo/Heme/Allergies: Negative.   Psychiatric/Behavioral: Negative.   All other systems reviewed and are negative. 14 point ROS was done and is otherwise as detailed above or in HPI  PHYSICAL EXAMINATION: ECOG PERFORMANCE STATUS: 1 - Symptomatic but completely ambulatory  Vitals:   04/16/16 1012  BP: 118/60  Pulse: 65  Resp: 20  Temp: 97.8 F (36.6 C)   Filed Weights   04/16/16 1012  Weight: (!) 301 lb (136.5 kg)   Physical Exam  Constitutional: He is oriented to person, place, and time and well-developed, well-nourished, and in no distress.  HENT:  Head: Normocephalic and atraumatic.  Mouth/Throat: Oropharynx is clear and moist.  Eyes: Conjunctivae and EOM are normal. Pupils are equal, round, and  reactive to light.  Neck: Normal range of motion. Neck supple.  Cardiovascular: Normal rate, regular rhythm and normal heart sounds.   Pulmonary/Chest: Effort normal and breath sounds normal. No respiratory distress.  Abdominal: Soft. Bowel sounds are normal.  Musculoskeletal: Normal range of motion.  Neurological: He is alert and oriented to person, place, and time. Gait normal.  Skin: Skin is warm and dry.  Nursing note and vitals reviewed.  LABORATORY DATA:  I have reviewed the data as listed. Lab Results  Component Value Date   WBC 7.6 04/16/2016   HGB 15.1 04/16/2016   HCT 45.7 04/16/2016   MCV 97.9 04/16/2016   PLT 208 04/16/2016   CMP     Component Value Date/Time   NA 137 04/16/2016 0910   K 4.1 04/16/2016 0910   CL 98 (L) 04/16/2016 0910   CO2 29 04/16/2016 0910   GLUCOSE 179 (H) 04/16/2016 0910   BUN 25 (H) 04/16/2016 0910   CREATININE 1.24 04/16/2016 0910   CALCIUM 9.0 04/16/2016 0910   PROT 6.7 04/16/2016 0910   ALBUMIN 3.9 04/16/2016 0910   AST 23 04/16/2016 0910   ALT 25 04/16/2016 0910   ALKPHOS 48 04/16/2016 0910   BILITOT 0.6 04/16/2016 0910   GFRNONAA >60 04/16/2016 0910   GFRAA >60 04/16/2016 0910   PATHOLOGY:  Outside report 03/22/2016 Diagnosis: "RIGHT UPPER LOBE MASS"; BIOPSY: High grade neuroendocrine carcinoma, most consistent with small cell carcinoma.  Note: the tumor cells are positive for CK7, TTF-1 (diffuse and strong), synaptophysin (diffuse and strong), chromogranin (focal and weak) while negative for Napsin A, CK20, CDX2 and p63 supporting the diagnosis.  A Ki67 stain shows a proliferative rate of 40%  RADIOGRAPHIC STUDIES: I have personally reviewed the radiological images as listed and agreed with the findings in the report.  PET scan 12/22/2015 IMPRESSION: 1. Hypermetabolic RIGHT upper lobe mass abutting pleural surface of the RIGHT lung apex consistent with primary bronchogenic carcinoma. 2. No evidence of metastatic disease.   T2a N0 M0 . 3. Paraseptal emphysema.  Outside Imaging 12/09/2015  Chest CT IMPRESSION: 4 cm spiculated right apical mass, adjacent to 2nd rib. No mediastinal adenopathy.   ASSESSMENT & PLAN:  Small cell carcinoma of RUL  Cancer Staging Small cell lung cancer ([place]) Staging form: Lung, AJCC 8th Edition - Clinical: Stage IIIC (cT3, cN3, cM0) - Signed by Twana First, MD on 04/16/2016    PLAN: Reviewed PET and MRI brain results with the patient, he has limited stage SCLC.  Plan to treat with concurrent chemoRT with curative intent.  Begin chemotherapy today with Cisplatin and etoposide q21 days. Chemo teaching performed today.  Stat referral to radiation oncology for concurrent radiation. RTC in 10 days for follow up to assess tolerability to chemotherapy.   ORDERS PLACED FOR THIS ENCOUNTER: Orders Placed This Encounter  Procedures  . CBC with Differential    Standing Status:   Standing    Number of Occurrences:   20    Standing Expiration Date:   04/17/2017  . Comprehensive metabolic panel    Standing Status:   Standing    Number of Occurrences:   20    Standing Expiration Date:   04/17/2017  . CBC with Differential [facility])    Standing Status:   Standing    Number of Occurrences:   20    Standing Expiration Date:   04/17/2017  . Comprehensive metabolic panel    Standing Status:   Standing    Number of Occurrences:   20    Standing Expiration Date:   04/17/2017  . CBC with Differential    Standing Status:   Standing    Number of Occurrences:   20    Standing Expiration Date:   04/17/2017  . Comprehensive metabolic panel    Standing Status:   Standing    Number of Occurrences:   20    Standing Expiration Date:   04/17/2017  . PHYSICIAN COMMUNICATION ORDER    A baseline Audiogram is recommended prior to initiation of cisplatin chemotherapy.     MEDICATIONS PRESCRIBED THIS ENCOUNTER: Meds ordered this encounter  Medications  . lidocaine-prilocaine (EMLA) cream     Sig: Apply to affected area once    Dispense:  30 g    Refill:  3  . ondansetron (ZOFRAN) 8 MG tablet    Sig: Take 1 tablet (8 mg total) by mouth 2 (two) times daily as needed. Start on the third day after cisplatin chemotherapy.    Dispense:  30 tablet    Refill:  1  . dexamethasone (DECADRON) 4 MG tablet    Sig: Take 2 tablets two times a day for 1 day on day 4 after cisplatin chemotherapy. Take with food.    Dispense:  30 tablet    Refill:  1  . prochlorperazine (COMPAZINE) 10 MG tablet    Sig: Take 1 tablet (10 mg total) by mouth every 6 (six) hours as needed (Nausea or vomiting).    Dispense:  30 tablet    Refill:  1  . LORazepam (ATIVAN) 0.5 MG tablet    Sig: Take 1 tablet (0.5 mg total) by mouth every 6 (six) hours as needed (Nausea or vomiting).    Dispense:  30 tablet    Refill:  0   All questions were answered. The patient knows to call the clinic with any problems, questions or concerns.  This document serves as a record of services personally performed by Twana First, MD. It was created on her behalf by Maryla Morrow, a trained medical scribe. The  creation of this record is based on the scribe's personal observations and the provider's statements to them. This document has been checked and approved by the attending provider.  I have reviewed the above documentation for accuracy and completeness and I agree with the above.  This note was electronically signed.    Twana First, MD 04/16/2016 10:19 AM

## 2016-04-16 NOTE — Progress Notes (Signed)
Chemo teaching done and consent signed for Cisplatin and Etoposide. Patient given the blue folder with teaching materials inside. Nutritional book and Chemo and You book given to patient. Patient's med list updated. Pt states he doesn't think he needs his EMLA cream. I will call the drug store and have them put that on file for him. Pt instructed to bring his blue folder with him this week to chemo so that if he has questions about what he reads - he can ask those questions.

## 2016-04-16 NOTE — Progress Notes (Signed)
Only giving etoposide today per MD. Will give cisplatin, etoposide on  day 2, tomorrow.  Chemotherapy given today per orders. Patient tolerated it well. Vitals stable and discharged home from clinic via wheelchair. Follow up as scheduled.

## 2016-04-16 NOTE — Patient Instructions (Signed)
[place] at [facility] Discharge Instructions  RECOMMENDATIONS MADE BY THE CONSULTANT AND ANY TEST RESULTS WILL BE SENT TO YOUR REFERRING PHYSICIAN.  You were seen today by Dr. Twana First You will get your first chemotherapy treatment today Follow up in 10 days  See Amy up front for appointments   Thank you for choosing [place] at [facility] to provide your oncology and hematology care.  To afford each patient quality time with our provider, please arrive at least 15 minutes before your scheduled appointment time.    If you have a lab appointment with the [facility] please come in thru the  Main Entrance and check in at the main information desk  You need to re-schedule your appointment should you arrive 10 or more minutes late.  We strive to give you quality time with our providers, and arriving late affects you and other patients whose appointments are after yours.  Also, if you no show three or more times for appointments you may be dismissed from the clinic at the providers discretion.     Again, thank you for choosing [facility].  Our hope is that these requests will decrease the amount of time that you wait before being seen by our physicians.       _____________________________________________________________  Should you have questions after your visit to [facility], please contact our office at [phone] between the hours of 8:30 a.m. and 4:30 p.m.  Voicemails left after 4:30 p.m. will not be returned until the following business day.  For prescription refill requests, have your pharmacy contact our office.       Resources For Cancer Patients and their Caregivers ? American Cancer Society: Can assist with transportation, wigs, general needs, runs Look Good Feel Better.        (920) 154-3682 ? Cancer Care: Provides financial assistance, online support groups, medication/co-pay assistance.   1-800-813-HOPE 770-469-5172) ? [place] [facility] Assists [place] cancer patients and their families through emotional , educational and financial support.  707-589-1731 ? [place] DSS Where to apply for food stamps, Medicaid and utility assistance. 769-061-3559 ? RCATS: Transportation to medical appointments. (272)553-6176 ? Social Security Administration: May apply for disability if have a Stage IV cancer. (959) 516-8068 (480)517-3460 ? [employer], Disability and Transit Services: Assists with nutrition, care and transit needs. [place] Support Programs: '@10RELATIVEDAYS'$ @ > Cancer Support Group  2nd Tuesday of the month 1pm-2pm, Journey Room  > Creative Journey  3rd Tuesday of the month 1130am-1pm, Journey Room  > Look Good Feel Better  1st Wednesday of the month 10am-12 noon, Journey Room (Call [place] to register [phone])

## 2016-04-17 ENCOUNTER — Encounter (HOSPITAL_BASED_OUTPATIENT_CLINIC_OR_DEPARTMENT_OTHER): Payer: Medicare Other

## 2016-04-17 ENCOUNTER — Encounter: Payer: Self-pay | Admitting: *Deleted

## 2016-04-17 VITALS — BP 146/44 | HR 77 | Temp 98.6°F | Resp 20 | Wt 300.6 lb

## 2016-04-17 DIAGNOSIS — Z87891 Personal history of nicotine dependence: Secondary | ICD-10-CM | POA: Diagnosis not present

## 2016-04-17 DIAGNOSIS — Z5111 Encounter for antineoplastic chemotherapy: Secondary | ICD-10-CM

## 2016-04-17 DIAGNOSIS — N400 Enlarged prostate without lower urinary tract symptoms: Secondary | ICD-10-CM | POA: Diagnosis not present

## 2016-04-17 DIAGNOSIS — R0602 Shortness of breath: Secondary | ICD-10-CM | POA: Diagnosis not present

## 2016-04-17 DIAGNOSIS — C3491 Malignant neoplasm of unspecified part of right bronchus or lung: Secondary | ICD-10-CM

## 2016-04-17 DIAGNOSIS — Z7901 Long term (current) use of anticoagulants: Secondary | ICD-10-CM | POA: Diagnosis not present

## 2016-04-17 DIAGNOSIS — R531 Weakness: Secondary | ICD-10-CM | POA: Diagnosis not present

## 2016-04-17 DIAGNOSIS — E1165 Type 2 diabetes mellitus with hyperglycemia: Secondary | ICD-10-CM | POA: Diagnosis not present

## 2016-04-17 DIAGNOSIS — Z9981 Dependence on supplemental oxygen: Secondary | ICD-10-CM | POA: Diagnosis not present

## 2016-04-17 DIAGNOSIS — I509 Heart failure, unspecified: Secondary | ICD-10-CM | POA: Diagnosis not present

## 2016-04-17 DIAGNOSIS — I4891 Unspecified atrial fibrillation: Secondary | ICD-10-CM | POA: Diagnosis not present

## 2016-04-17 DIAGNOSIS — C3411 Malignant neoplasm of upper lobe, right bronchus or lung: Secondary | ICD-10-CM | POA: Diagnosis not present

## 2016-04-17 DIAGNOSIS — Z794 Long term (current) use of insulin: Secondary | ICD-10-CM | POA: Diagnosis not present

## 2016-04-17 DIAGNOSIS — I110 Hypertensive heart disease with heart failure: Secondary | ICD-10-CM | POA: Diagnosis not present

## 2016-04-17 DIAGNOSIS — J449 Chronic obstructive pulmonary disease, unspecified: Secondary | ICD-10-CM | POA: Diagnosis not present

## 2016-04-17 DIAGNOSIS — Z79899 Other long term (current) drug therapy: Secondary | ICD-10-CM | POA: Diagnosis not present

## 2016-04-17 DIAGNOSIS — Z7982 Long term (current) use of aspirin: Secondary | ICD-10-CM | POA: Diagnosis not present

## 2016-04-17 MED ORDER — HEPARIN SOD (PORK) LOCK FLUSH 100 UNIT/ML IV SOLN
500.0000 [IU] | Freq: Once | INTRAVENOUS | Status: AC | PRN
  Administered 2016-04-17: 500 [IU]
  Filled 2016-04-17: qty 5

## 2016-04-17 MED ORDER — DEXAMETHASONE SODIUM PHOSPHATE 10 MG/ML IJ SOLN
10.0000 mg | Freq: Once | INTRAMUSCULAR | Status: AC
  Administered 2016-04-17: 10 mg via INTRAVENOUS
  Filled 2016-04-17: qty 1

## 2016-04-17 MED ORDER — PALONOSETRON HCL INJECTION 0.25 MG/5ML
INTRAVENOUS | Status: AC
  Filled 2016-04-17: qty 5

## 2016-04-17 MED ORDER — DEXAMETHASONE SODIUM PHOSPHATE 100 MG/10ML IJ SOLN: 10.0000 mg | Freq: Once | INTRAMUSCULAR | Status: DC

## 2016-04-17 MED ORDER — SODIUM CHLORIDE 0.9 % IV SOLN
Freq: Once | INTRAVENOUS | Status: AC
  Administered 2016-04-17: 12:00:00 via INTRAVENOUS

## 2016-04-17 MED ORDER — FOSAPREPITANT DIMEGLUMINE INJECTION 150 MG
Freq: Once | INTRAVENOUS | Status: AC
  Administered 2016-04-17: 12:00:00 via INTRAVENOUS
  Filled 2016-04-17: qty 5

## 2016-04-17 MED ORDER — POTASSIUM CHLORIDE 2 MEQ/ML IV SOLN
Freq: Once | INTRAVENOUS | Status: AC
  Administered 2016-04-17: 09:00:00 via INTRAVENOUS
  Filled 2016-04-17: qty 10

## 2016-04-17 MED ORDER — IPRATROPIUM-ALBUTEROL 0.5-2.5 (3) MG/3ML IN SOLN
3.0000 mL | RESPIRATORY_TRACT | Status: DC | PRN
  Administered 2016-04-17: 3 mL via RESPIRATORY_TRACT

## 2016-04-17 MED ORDER — IPRATROPIUM-ALBUTEROL 0.5-2.5 (3) MG/3ML IN SOLN
RESPIRATORY_TRACT | Status: AC
  Filled 2016-04-17: qty 3

## 2016-04-17 MED ORDER — SODIUM CHLORIDE 0.9% FLUSH
10.0000 mL | INTRAVENOUS | Status: DC | PRN
  Administered 2016-04-17: 10 mL
  Filled 2016-04-17: qty 10

## 2016-04-17 MED ORDER — DEXAMETHASONE SODIUM PHOSPHATE 10 MG/ML IJ SOLN
INTRAMUSCULAR | Status: AC
  Filled 2016-04-17: qty 1

## 2016-04-17 MED ORDER — SODIUM CHLORIDE 0.9 % IV SOLN
60.0000 mg/m2 | Freq: Once | INTRAVENOUS | Status: AC
  Administered 2016-04-17: 159 mg via INTRAVENOUS
  Filled 2016-04-17: qty 159

## 2016-04-17 MED ORDER — PALONOSETRON HCL INJECTION 0.25 MG/5ML
0.2500 mg | Freq: Once | INTRAVENOUS | Status: AC
  Administered 2016-04-17: 0.25 mg via INTRAVENOUS
  Filled 2016-04-17: qty 5

## 2016-04-17 MED ORDER — SODIUM CHLORIDE 0.9 % IV SOLN
120.0000 mg/m2 | Freq: Once | INTRAVENOUS | Status: AC
  Administered 2016-04-17: 320 mg via INTRAVENOUS
  Filled 2016-04-17: qty 16

## 2016-04-17 NOTE — Progress Notes (Signed)
Ok to cancel post Cisplatin today per dr Talbert Cage.  Pt gets an additional 1059m fluids days 1,2,3 due to larger dose (>'200mg'$ ) of etoposide.  Pt has COPD and fluid retention (treated with Lasix), but hasn't gained wt since yesterday.

## 2016-04-17 NOTE — Patient Instructions (Signed)
[facility] Discharge Instructions for Patients Receiving Chemotherapy   Beginning January 23rd 2017 lab work for the Cancer Center will be done in the  Main lab at [place] on 1st floor. If you have a lab appointment with the Cancer Center please come in thru the  Main Entrance and check in at the main information desk   Today you received the following chemotherapy agents   To help prevent nausea and vomiting after your treatment, we encourage you to take your nausea medication     If you develop nausea and vomiting, or diarrhea that is not controlled by your medication, call the clinic.  The clinic phone number is [phone]. Office hours are Monday-Friday 8:30am-5:00pm.  BELOW ARE SYMPTOMS THAT SHOULD BE REPORTED IMMEDIATELY:  *FEVER GREATER THAN 101.0 F  *CHILLS WITH OR WITHOUT FEVER  NAUSEA AND VOMITING THAT IS NOT CONTROLLED WITH YOUR NAUSEA MEDICATION  *UNUSUAL SHORTNESS OF BREATH  *UNUSUAL BRUISING OR BLEEDING  TENDERNESS IN MOUTH AND THROAT WITH OR WITHOUT PRESENCE OF ULCERS  *URINARY PROBLEMS  *BOWEL PROBLEMS  UNUSUAL RASH Items with * indicate a potential emergency and should be followed up as soon as possible. If you have an emergency after office hours please contact your primary care physician or go to the nearest emergency department.  Please call the clinic during office hours if you have any questions or concerns.   You may also contact the Patient Navigator at [phone] should you have any questions or need assistance in obtaining follow up care.      Resources For Cancer Patients and their Caregivers ? American Cancer Society: Can assist with transportation, wigs, general needs, runs Look Good Feel Better.        1-888-227-6333 ? Cancer Care: Provides financial assistance, online support groups, medication/co-pay assistance.  1-800-813-HOPE (4673) ? [facility] Assists [place] cancer  patients and their families through emotional , educational and financial support.  336-427-4357 ? [place] DSS Where to apply for food stamps, Medicaid and utility assistance. 336-342-1394 ? RCATS: Transportation to medical appointments. 336-347-2287 ? Social Security Administration: May apply for disability if have a Stage IV cancer. 336-342-7796 1-800-772-1213 ? [place] Aging, Disability and Transit Services: Assists with nutrition, care and transit needs. 336-349-2343         

## 2016-04-17 NOTE — Progress Notes (Signed)
[place] Clinical Social Work  Clinical Social Work was referred by Lupita Raider, nurse for assessment of psychosocial needs due to starting treatment, new cancer diagnosis. Clinical Social Worker met with patient at [facility] during treatment to offer support and assess for needs. CSW introduced self, explained role of CSW/Pt and Family Support Team, support groups and other resources to assist. Pt shared he has sisters in the area, but their relationship is strained. He reports his niece and grand niece live with him and he helps support them. Pt reports to receive social security for his income and is just over the line for many other resources. He receives $16 a month in food stamps. Pt shared he was concerned about transportation and CSW reviewed local resources to assist patient. CSW made sure he had sheet with resources and contacts. CSW reviewed this information in detail.  Pt shared his biggest concern is strained family relationships and needing additional support. CSW reviewed resources for additional support and how to access. CSW also reviewed common emotions to cancer diagnosis and attempted to educate on various family emotions as well. Pt reports his died over lung cancer a few years ago. CSW attempted to explain that his family may be scared and unsure how to react. Pt shared his "family fell apart after mother died". CSW attempted to explain that this may bring up some old hard, grief related emotions for family. CSW encouraged pt to consider additional support and he plans to consider. He is interested in referral to chaplain today. CSW to refer. CSW to follow and assist accordingly.       Clinical Social Work interventions: Resource education and referral Supportive counseling    Loren Racer, LCSW, OSW-C [place] Tuesdays   [phone]

## 2016-04-17 NOTE — Progress Notes (Signed)
1000-patient stated he thought he needed oxygen. He layed down and got SOB. I checked his Oxygen saturation and it was 95% on RA, I did apply O2 per patient request.   Patient voided 438m of urine, clear yellow.   Patient took his own blood sugar, it was 372. He has his own insulin pen with him, instructed him to take 10units per TKirby CriglerPA-C.   Chemotherapy given today per orders. Patient tolerated it well, no issues. Vitals stable and discharged via wheelchair from clinic. Follow up as scheduled.

## 2016-04-18 ENCOUNTER — Encounter (HOSPITAL_BASED_OUTPATIENT_CLINIC_OR_DEPARTMENT_OTHER): Payer: Medicare Other

## 2016-04-18 VITALS — BP 124/60 | HR 64 | Temp 97.5°F | Resp 20 | Wt 303.8 lb

## 2016-04-18 DIAGNOSIS — R0602 Shortness of breath: Secondary | ICD-10-CM | POA: Diagnosis not present

## 2016-04-18 DIAGNOSIS — Z5111 Encounter for antineoplastic chemotherapy: Secondary | ICD-10-CM

## 2016-04-18 DIAGNOSIS — Z5189 Encounter for other specified aftercare: Secondary | ICD-10-CM | POA: Diagnosis not present

## 2016-04-18 DIAGNOSIS — C3490 Malignant neoplasm of unspecified part of unspecified bronchus or lung: Secondary | ICD-10-CM

## 2016-04-18 DIAGNOSIS — C3491 Malignant neoplasm of unspecified part of right bronchus or lung: Secondary | ICD-10-CM | POA: Diagnosis not present

## 2016-04-18 LAB — CBC WITH DIFFERENTIAL/PLATELET
Basophils Absolute: 0 10*3/uL (ref 0.0–0.1)
Basophils Relative: 0 %
Eosinophils Absolute: 0 10*3/uL (ref 0.0–0.7)
Eosinophils Relative: 0 %
HCT: 41.2 % (ref 39.0–52.0)
Hemoglobin: 13.4 g/dL (ref 13.0–17.0)
Lymphocytes Relative: 5 %
Lymphs Abs: 0.5 10*3/uL — ABNORMAL LOW (ref 0.7–4.0)
MCH: 31.6 pg (ref 26.0–34.0)
MCHC: 32.5 g/dL (ref 30.0–36.0)
MCV: 97.2 fL (ref 78.0–100.0)
Monocytes Absolute: 0.7 10*3/uL (ref 0.1–1.0)
Monocytes Relative: 7 %
Neutro Abs: 9.4 10*3/uL — ABNORMAL HIGH (ref 1.7–7.7)
Neutrophils Relative %: 88 %
Platelets: 181 10*3/uL (ref 150–400)
RBC: 4.24 MIL/uL (ref 4.22–5.81)
RDW: 13.7 % (ref 11.5–15.5)
WBC: 10.6 10*3/uL — ABNORMAL HIGH (ref 4.0–10.5)

## 2016-04-18 LAB — BRAIN NATRIURETIC PEPTIDE: B Natriuretic Peptide: 163 pg/mL — ABNORMAL HIGH (ref 0.0–100.0)

## 2016-04-18 LAB — COMPREHENSIVE METABOLIC PANEL
ALT: 25 U/L (ref 17–63)
AST: 19 U/L (ref 15–41)
Albumin: 3.6 g/dL (ref 3.5–5.0)
Alkaline Phosphatase: 44 U/L (ref 38–126)
Anion gap: 6 (ref 5–15)
BUN: 28 mg/dL — ABNORMAL HIGH (ref 6–20)
CO2: 29 mmol/L (ref 22–32)
Calcium: 9.1 mg/dL (ref 8.9–10.3)
Chloride: 102 mmol/L (ref 101–111)
Creatinine, Ser: 1.11 mg/dL (ref 0.61–1.24)
GFR calc Af Amer: >60 mL/min (ref >60)
GFR calc non Af Amer: >60 mL/min (ref >60)
Glucose, Bld: 268 mg/dL — ABNORMAL HIGH (ref 65–99)
Potassium: 4.2 mmol/L (ref 3.5–5.1)
Sodium: 137 mmol/L (ref 135–145)
Total Bilirubin: 0.7 mg/dL (ref 0.3–1.2)
Total Protein: 6.4 g/dL — ABNORMAL LOW (ref 6.5–8.1)

## 2016-04-18 MED ORDER — FUROSEMIDE 10 MG/ML IJ SOLN
INTRAMUSCULAR | Status: AC
  Filled 2016-04-18: qty 4

## 2016-04-18 MED ORDER — HEPARIN SOD (PORK) LOCK FLUSH 100 UNIT/ML IV SOLN
INTRAVENOUS | Status: AC
  Filled 2016-04-18: qty 5

## 2016-04-18 MED ORDER — SODIUM CHLORIDE 0.9% FLUSH
10.0000 mL | INTRAVENOUS | Status: DC | PRN
  Administered 2016-04-18: 10 mL
  Filled 2016-04-18: qty 10

## 2016-04-18 MED ORDER — SODIUM CHLORIDE 0.9 % IV SOLN
Freq: Once | INTRAVENOUS | Status: AC
  Administered 2016-04-18: 10:00:00 via INTRAVENOUS

## 2016-04-18 MED ORDER — HEPARIN SOD (PORK) LOCK FLUSH 100 UNIT/ML IV SOLN
500.0000 [IU] | Freq: Once | INTRAVENOUS | Status: AC | PRN
  Administered 2016-04-18: 500 [IU]
  Filled 2016-04-18: qty 5

## 2016-04-18 MED ORDER — PEGFILGRASTIM 6 MG/0.6ML ~~LOC~~ PSKT
6.0000 mg | PREFILLED_SYRINGE | Freq: Once | SUBCUTANEOUS | Status: AC
  Administered 2016-04-18: 6 mg via SUBCUTANEOUS
  Filled 2016-04-18: qty 0.6

## 2016-04-18 MED ORDER — IPRATROPIUM-ALBUTEROL 0.5-2.5 (3) MG/3ML IN SOLN
RESPIRATORY_TRACT | Status: AC
  Filled 2016-04-18: qty 3

## 2016-04-18 MED ORDER — IPRATROPIUM-ALBUTEROL 0.5-2.5 (3) MG/3ML IN SOLN
3.0000 mL | RESPIRATORY_TRACT | Status: DC
  Administered 2016-04-18: 3 mL via RESPIRATORY_TRACT

## 2016-04-18 MED ORDER — FUROSEMIDE 10 MG/ML IJ SOLN
40.0000 mg | Freq: Once | INTRAMUSCULAR | Status: AC
  Administered 2016-04-18: 40 mg via INTRAVENOUS

## 2016-04-18 MED ORDER — SODIUM CHLORIDE 0.9 % IV SOLN: 10.0000 mg | Freq: Once | INTRAVENOUS | Status: DC

## 2016-04-18 MED ORDER — SODIUM CHLORIDE 0.9 % IV SOLN
120.0000 mg/m2 | Freq: Once | INTRAVENOUS | Status: AC
  Administered 2016-04-18: 320 mg via INTRAVENOUS
  Filled 2016-04-18: qty 16

## 2016-04-18 MED ORDER — DEXAMETHASONE SODIUM PHOSPHATE 10 MG/ML IJ SOLN
10.0000 mg | Freq: Once | INTRAMUSCULAR | Status: AC
Start: 2016-04-18 — End: 2016-04-18
  Administered 2016-04-18: 10 mg via INTRAVENOUS
  Filled 2016-04-18: qty 1

## 2016-04-18 NOTE — Patient Instructions (Addendum)
[facility] Discharge Instructions for Patients Receiving Chemotherapy   Beginning January 23rd 2017 lab work for the Cancer Center will be done in the  Main lab at [place] on 1st floor. If you have a lab appointment with the Cancer Center please come in thru the  Main Entrance and check in at the main information desk   Today you received the following chemotherapy agents   To help prevent nausea and vomiting after your treatment, we encourage you to take your nausea medication     If you develop nausea and vomiting, or diarrhea that is not controlled by your medication, call the clinic.  The clinic phone number is [phone]. Office hours are Monday-Friday 8:30am-5:00pm.  BELOW ARE SYMPTOMS THAT SHOULD BE REPORTED IMMEDIATELY:  *FEVER GREATER THAN 101.0 F  *CHILLS WITH OR WITHOUT FEVER  NAUSEA AND VOMITING THAT IS NOT CONTROLLED WITH YOUR NAUSEA MEDICATION  *UNUSUAL SHORTNESS OF BREATH  *UNUSUAL BRUISING OR BLEEDING  TENDERNESS IN MOUTH AND THROAT WITH OR WITHOUT PRESENCE OF ULCERS  *URINARY PROBLEMS  *BOWEL PROBLEMS  UNUSUAL RASH Items with * indicate a potential emergency and should be followed up as soon as possible. If you have an emergency after office hours please contact your primary care physician or go to the nearest emergency department.  Please call the clinic during office hours if you have any questions or concerns.   You may also contact the Patient Navigator at [phone] should you have any questions or need assistance in obtaining follow up care.      Resources For Cancer Patients and their Caregivers ? American Cancer Society: Can assist with transportation, wigs, general needs, runs Look Good Feel Better.        1-888-227-6333 ? Cancer Care: Provides financial assistance, online support groups, medication/co-pay assistance.  1-800-813-HOPE (4673) ? [facility] Assists [place] cancer  patients and their families through emotional , educational and financial support.  336-427-4357 ? [place] DSS Where to apply for food stamps, Medicaid and utility assistance. 336-342-1394 ? RCATS: Transportation to medical appointments. 336-347-2287 ? Social Security Administration: May apply for disability if have a Stage IV cancer. 336-342-7796 1-800-772-1213 ? [place] Aging, Disability and Transit Services: Assists with nutrition, care and transit needs. [phone]         

## 2016-04-18 NOTE — Progress Notes (Signed)
Patient presents today for day 3 of chemo. Patient states his blood sugar was 415 this morning, He went to [facility] ED last night for hypergycemia, SOB. Patient's weight is up 3 lbs today. Chase Crigler PA-C notified and will  Draw labs before proceeding.  Labs reviewed with PA, will give '40mg'$  of lasix as ordered.  Chemotherapy given today, patient tolerated it well. Patient requested breathing treatment, given as ordered by respiratory. Chase KitchenBaker Herrera arrived today for [facility] neulasta on body injector. See MAR for administration details. Injector in place and engaged with green light indicator on flashing. Tolerated application with out problems.  Reiterated to patient about when to take his nausea meds and dexamethasone. Vitals stable and discharged home via wheelchair with oxygen on . New schedule printed and highlighted when to come back.follow up as scheduled.

## 2016-04-19 ENCOUNTER — Telehealth (HOSPITAL_COMMUNITY): Payer: Self-pay

## 2016-04-19 NOTE — Telephone Encounter (Signed)
24 hour follow follow up -patient stated he is doing well. He is tolerating fluids, emptying his bladder, denies shortness of breath. Instructed him again on what time to take his neulasta onpro off. Instructed patient to call if he had any concerns or questions.

## 2016-04-23 ENCOUNTER — Encounter (HOSPITAL_COMMUNITY): Payer: Self-pay | Admitting: Lab

## 2016-04-23 ENCOUNTER — Telehealth (HOSPITAL_COMMUNITY): Payer: Self-pay

## 2016-04-23 DIAGNOSIS — C3411 Malignant neoplasm of upper lobe, right bronchus or lung: Secondary | ICD-10-CM | POA: Diagnosis not present

## 2016-04-23 NOTE — Telephone Encounter (Signed)
Patient called wanting to know when his next treatment is because he wants to go visit his brother who lives out of town. He has a follow up on 04/25/16 with Elzie Rings, NP. Explained to patient I would send her a message and she will be able to tell him more then. Patient verbalized understanding.

## 2016-04-23 NOTE — Progress Notes (Unsigned)
Referral to [facility].  Records faxed on 4/2

## 2016-04-23 NOTE — Telephone Encounter (Signed)
He states he has never been told to go to radiation. He says he hopes he doesn't have to have it. Also his niece and her 64 year old daughter live with him. Are there any precautions he needs to take concerning the radiation  ( if he has it) and chemo and the 55 year old child?

## 2016-04-23 NOTE — Telephone Encounter (Signed)
Has he started radiation yet? He will be getting daily radiation with his chemotherapy every 3 weeks.   Mike Craze, NP [place] [phone]

## 2016-04-24 ENCOUNTER — Telehealth (HOSPITAL_COMMUNITY): Payer: Self-pay

## 2016-04-24 DIAGNOSIS — Z510 Encounter for antineoplastic radiation therapy: Secondary | ICD-10-CM | POA: Diagnosis not present

## 2016-04-24 DIAGNOSIS — C3411 Malignant neoplasm of upper lobe, right bronchus or lung: Secondary | ICD-10-CM | POA: Diagnosis not present

## 2016-04-24 DIAGNOSIS — J440 Chronic obstructive pulmonary disease with acute lower respiratory infection: Secondary | ICD-10-CM | POA: Diagnosis not present

## 2016-04-24 DIAGNOSIS — I482 Chronic atrial fibrillation: Secondary | ICD-10-CM | POA: Diagnosis present

## 2016-04-24 DIAGNOSIS — E119 Type 2 diabetes mellitus without complications: Secondary | ICD-10-CM | POA: Diagnosis present

## 2016-04-24 DIAGNOSIS — Z809 Family history of malignant neoplasm, unspecified: Secondary | ICD-10-CM | POA: Diagnosis not present

## 2016-04-24 DIAGNOSIS — I4891 Unspecified atrial fibrillation: Secondary | ICD-10-CM | POA: Diagnosis not present

## 2016-04-24 DIAGNOSIS — Z87891 Personal history of nicotine dependence: Secondary | ICD-10-CM | POA: Diagnosis not present

## 2016-04-24 DIAGNOSIS — Z794 Long term (current) use of insulin: Secondary | ICD-10-CM | POA: Diagnosis not present

## 2016-04-24 DIAGNOSIS — I509 Heart failure, unspecified: Secondary | ICD-10-CM | POA: Diagnosis not present

## 2016-04-24 DIAGNOSIS — R0902 Hypoxemia: Secondary | ICD-10-CM | POA: Diagnosis not present

## 2016-04-24 DIAGNOSIS — Z7982 Long term (current) use of aspirin: Secondary | ICD-10-CM | POA: Diagnosis not present

## 2016-04-24 DIAGNOSIS — I5042 Chronic combined systolic (congestive) and diastolic (congestive) heart failure: Secondary | ICD-10-CM | POA: Diagnosis not present

## 2016-04-24 DIAGNOSIS — I110 Hypertensive heart disease with heart failure: Secondary | ICD-10-CM | POA: Diagnosis present

## 2016-04-24 DIAGNOSIS — E860 Dehydration: Secondary | ICD-10-CM | POA: Diagnosis present

## 2016-04-24 DIAGNOSIS — Z79899 Other long term (current) drug therapy: Secondary | ICD-10-CM | POA: Diagnosis not present

## 2016-04-24 DIAGNOSIS — Z801 Family history of malignant neoplasm of trachea, bronchus and lung: Secondary | ICD-10-CM | POA: Diagnosis not present

## 2016-04-24 DIAGNOSIS — K449 Diaphragmatic hernia without obstruction or gangrene: Secondary | ICD-10-CM | POA: Diagnosis present

## 2016-04-24 DIAGNOSIS — D696 Thrombocytopenia, unspecified: Secondary | ICD-10-CM | POA: Diagnosis present

## 2016-04-24 DIAGNOSIS — Z825 Family history of asthma and other chronic lower respiratory diseases: Secondary | ICD-10-CM | POA: Diagnosis not present

## 2016-04-24 DIAGNOSIS — Z886 Allergy status to analgesic agent status: Secondary | ICD-10-CM | POA: Diagnosis not present

## 2016-04-24 DIAGNOSIS — Z7901 Long term (current) use of anticoagulants: Secondary | ICD-10-CM | POA: Diagnosis not present

## 2016-04-24 DIAGNOSIS — J441 Chronic obstructive pulmonary disease with (acute) exacerbation: Secondary | ICD-10-CM | POA: Diagnosis not present

## 2016-04-24 DIAGNOSIS — D702 Other drug-induced agranulocytosis: Secondary | ICD-10-CM | POA: Diagnosis not present

## 2016-04-24 DIAGNOSIS — J189 Pneumonia, unspecified organism: Secondary | ICD-10-CM | POA: Diagnosis not present

## 2016-04-24 DIAGNOSIS — J449 Chronic obstructive pulmonary disease, unspecified: Secondary | ICD-10-CM | POA: Diagnosis not present

## 2016-04-24 DIAGNOSIS — T451X5A Adverse effect of antineoplastic and immunosuppressive drugs, initial encounter: Secondary | ICD-10-CM | POA: Diagnosis present

## 2016-04-24 DIAGNOSIS — D701 Agranulocytosis secondary to cancer chemotherapy: Secondary | ICD-10-CM | POA: Diagnosis not present

## 2016-04-24 DIAGNOSIS — R0602 Shortness of breath: Secondary | ICD-10-CM | POA: Diagnosis not present

## 2016-04-24 DIAGNOSIS — Z8249 Family history of ischemic heart disease and other diseases of the circulatory system: Secondary | ICD-10-CM | POA: Diagnosis not present

## 2016-04-24 DIAGNOSIS — J168 Pneumonia due to other specified infectious organisms: Secondary | ICD-10-CM | POA: Diagnosis not present

## 2016-04-24 NOTE — Telephone Encounter (Signed)
Patient called stating his portable oxygen was not working as well as Film/video editor at his house. I asked him if the tank was full. He states it is a portable oxygen concentrator. He also states he has not had as many breathing treatments as he normally takes today. I asked him how often he takes nebulizer treatments ans he said he is supposed to take every 6 hours but he will do a treatment more often if he needs it. Encouraged patient to use as prescribed. He stated he had plenty of medication. Instructed patient to call the number on the machine to see if someone will come out and check it to be sure the portable concentrator is working correctly. He states he will do that. Also assured him we have oxygen here if he needs it at his appt tomorrow.

## 2016-04-24 NOTE — Telephone Encounter (Signed)
I will follow-up with Anderson [place] re: referral to radiation.  Will discuss with patient tomorrow at his appt.   Mike Craze, NP [place] [phone]

## 2016-04-25 ENCOUNTER — Other Ambulatory Visit (HOSPITAL_COMMUNITY): Payer: Medicare Other

## 2016-04-25 ENCOUNTER — Ambulatory Visit (HOSPITAL_COMMUNITY): Payer: Medicare Other | Admitting: Oncology

## 2016-04-25 ENCOUNTER — Telehealth (HOSPITAL_COMMUNITY): Payer: Self-pay

## 2016-04-25 ENCOUNTER — Ambulatory Visit (HOSPITAL_COMMUNITY): Payer: Medicare Other | Admitting: Adult Health

## 2016-04-25 ENCOUNTER — Ambulatory Visit (HOSPITAL_COMMUNITY): Payer: Medicare Other

## 2016-04-25 NOTE — Telephone Encounter (Signed)
Patient called stating he was admitted to 3rd floor last night because he has pneumonia. He also states his white count is very low. He is on IV antibiotics and states he will be in the hospital a few days. He asked if his white count was going to drop every time he had chemo and I explained that it would. He states " I can't take this every time". Instructed patient to call to reschedule appointments when he is discharged so he can talk to the MD about chemotherapy. Patient verbalized understanding.

## 2016-04-26 ENCOUNTER — Ambulatory Visit (HOSPITAL_COMMUNITY): Payer: Medicare Other

## 2016-04-27 ENCOUNTER — Ambulatory Visit (HOSPITAL_COMMUNITY): Payer: Medicare Other

## 2016-04-27 DIAGNOSIS — C3411 Malignant neoplasm of upper lobe, right bronchus or lung: Secondary | ICD-10-CM | POA: Diagnosis not present

## 2016-04-27 DIAGNOSIS — Z7901 Long term (current) use of anticoagulants: Secondary | ICD-10-CM | POA: Diagnosis not present

## 2016-04-27 DIAGNOSIS — J449 Chronic obstructive pulmonary disease, unspecified: Secondary | ICD-10-CM | POA: Diagnosis not present

## 2016-04-27 DIAGNOSIS — I509 Heart failure, unspecified: Secondary | ICD-10-CM | POA: Diagnosis not present

## 2016-04-27 DIAGNOSIS — I4891 Unspecified atrial fibrillation: Secondary | ICD-10-CM | POA: Diagnosis not present

## 2016-04-27 DIAGNOSIS — Z510 Encounter for antineoplastic radiation therapy: Secondary | ICD-10-CM | POA: Diagnosis not present

## 2016-05-03 DIAGNOSIS — D708 Other neutropenia: Secondary | ICD-10-CM | POA: Diagnosis not present

## 2016-05-03 DIAGNOSIS — J440 Chronic obstructive pulmonary disease with acute lower respiratory infection: Secondary | ICD-10-CM | POA: Diagnosis not present

## 2016-05-04 DIAGNOSIS — Z9981 Dependence on supplemental oxygen: Secondary | ICD-10-CM | POA: Diagnosis not present

## 2016-05-04 DIAGNOSIS — Z7982 Long term (current) use of aspirin: Secondary | ICD-10-CM | POA: Diagnosis not present

## 2016-05-04 DIAGNOSIS — C3411 Malignant neoplasm of upper lobe, right bronchus or lung: Secondary | ICD-10-CM | POA: Diagnosis not present

## 2016-05-04 DIAGNOSIS — E119 Type 2 diabetes mellitus without complications: Secondary | ICD-10-CM | POA: Diagnosis not present

## 2016-05-04 DIAGNOSIS — E669 Obesity, unspecified: Secondary | ICD-10-CM | POA: Diagnosis not present

## 2016-05-04 DIAGNOSIS — I509 Heart failure, unspecified: Secondary | ICD-10-CM | POA: Diagnosis not present

## 2016-05-04 DIAGNOSIS — N400 Enlarged prostate without lower urinary tract symptoms: Secondary | ICD-10-CM | POA: Diagnosis not present

## 2016-05-04 DIAGNOSIS — R0602 Shortness of breath: Secondary | ICD-10-CM | POA: Diagnosis not present

## 2016-05-04 DIAGNOSIS — Z7901 Long term (current) use of anticoagulants: Secondary | ICD-10-CM | POA: Diagnosis not present

## 2016-05-04 DIAGNOSIS — J449 Chronic obstructive pulmonary disease, unspecified: Secondary | ICD-10-CM | POA: Diagnosis not present

## 2016-05-04 DIAGNOSIS — Z794 Long term (current) use of insulin: Secondary | ICD-10-CM | POA: Diagnosis not present

## 2016-05-04 DIAGNOSIS — I110 Hypertensive heart disease with heart failure: Secondary | ICD-10-CM | POA: Diagnosis not present

## 2016-05-04 DIAGNOSIS — I4891 Unspecified atrial fibrillation: Secondary | ICD-10-CM | POA: Diagnosis not present

## 2016-05-04 DIAGNOSIS — Z87891 Personal history of nicotine dependence: Secondary | ICD-10-CM | POA: Diagnosis not present

## 2016-05-04 DIAGNOSIS — Z79899 Other long term (current) drug therapy: Secondary | ICD-10-CM | POA: Diagnosis not present

## 2016-05-07 ENCOUNTER — Encounter (HOSPITAL_COMMUNITY): Payer: Self-pay

## 2016-05-07 ENCOUNTER — Encounter (HOSPITAL_COMMUNITY): Payer: Medicare Other | Attending: Oncology

## 2016-05-07 VITALS — BP 175/70 | HR 72 | Temp 97.9°F | Resp 18 | Wt 299.4 lb

## 2016-05-07 DIAGNOSIS — J449 Chronic obstructive pulmonary disease, unspecified: Secondary | ICD-10-CM | POA: Insufficient documentation

## 2016-05-07 DIAGNOSIS — C3491 Malignant neoplasm of unspecified part of right bronchus or lung: Secondary | ICD-10-CM | POA: Insufficient documentation

## 2016-05-07 DIAGNOSIS — Z5111 Encounter for antineoplastic chemotherapy: Secondary | ICD-10-CM

## 2016-05-07 DIAGNOSIS — Z7982 Long term (current) use of aspirin: Secondary | ICD-10-CM | POA: Insufficient documentation

## 2016-05-07 DIAGNOSIS — R0602 Shortness of breath: Secondary | ICD-10-CM | POA: Diagnosis not present

## 2016-05-07 DIAGNOSIS — Z9889 Other specified postprocedural states: Secondary | ICD-10-CM | POA: Diagnosis not present

## 2016-05-07 DIAGNOSIS — F329 Major depressive disorder, single episode, unspecified: Secondary | ICD-10-CM | POA: Insufficient documentation

## 2016-05-07 DIAGNOSIS — E119 Type 2 diabetes mellitus without complications: Secondary | ICD-10-CM | POA: Insufficient documentation

## 2016-05-07 DIAGNOSIS — I4892 Unspecified atrial flutter: Secondary | ICD-10-CM | POA: Diagnosis not present

## 2016-05-07 DIAGNOSIS — I509 Heart failure, unspecified: Secondary | ICD-10-CM | POA: Diagnosis not present

## 2016-05-07 DIAGNOSIS — Z7901 Long term (current) use of anticoagulants: Secondary | ICD-10-CM | POA: Diagnosis not present

## 2016-05-07 DIAGNOSIS — C3411 Malignant neoplasm of upper lobe, right bronchus or lung: Secondary | ICD-10-CM | POA: Diagnosis not present

## 2016-05-07 DIAGNOSIS — I4891 Unspecified atrial fibrillation: Secondary | ICD-10-CM | POA: Diagnosis not present

## 2016-05-07 DIAGNOSIS — Z510 Encounter for antineoplastic radiation therapy: Secondary | ICD-10-CM | POA: Diagnosis not present

## 2016-05-07 DIAGNOSIS — I429 Cardiomyopathy, unspecified: Secondary | ICD-10-CM | POA: Diagnosis not present

## 2016-05-07 DIAGNOSIS — Z87891 Personal history of nicotine dependence: Secondary | ICD-10-CM | POA: Insufficient documentation

## 2016-05-07 DIAGNOSIS — I480 Paroxysmal atrial fibrillation: Secondary | ICD-10-CM | POA: Insufficient documentation

## 2016-05-07 DIAGNOSIS — F1010 Alcohol abuse, uncomplicated: Secondary | ICD-10-CM | POA: Insufficient documentation

## 2016-05-07 DIAGNOSIS — Z794 Long term (current) use of insulin: Secondary | ICD-10-CM | POA: Diagnosis not present

## 2016-05-07 LAB — CBC WITH DIFFERENTIAL/PLATELET
Basophils Absolute: 0.1 10*3/uL (ref 0.0–0.1)
Basophils Relative: 1 %
Eosinophils Absolute: 0 10*3/uL (ref 0.0–0.7)
Eosinophils Relative: 0 %
HCT: 38.8 % — ABNORMAL LOW (ref 39.0–52.0)
Hemoglobin: 12.6 g/dL — ABNORMAL LOW (ref 13.0–17.0)
Lymphocytes Relative: 12 %
Lymphs Abs: 1.4 10*3/uL (ref 0.7–4.0)
MCH: 31.9 pg (ref 26.0–34.0)
MCHC: 32.5 g/dL (ref 30.0–36.0)
MCV: 98.2 fL (ref 78.0–100.0)
Monocytes Absolute: 1.3 10*3/uL — ABNORMAL HIGH (ref 0.1–1.0)
Monocytes Relative: 11 %
Neutro Abs: 9 10*3/uL — ABNORMAL HIGH (ref 1.7–7.7)
Neutrophils Relative %: 76 %
Platelets: 221 10*3/uL (ref 150–400)
RBC: 3.95 MIL/uL — ABNORMAL LOW (ref 4.22–5.81)
RDW: 14.5 % (ref 11.5–15.5)
WBC: 11.8 10*3/uL — ABNORMAL HIGH (ref 4.0–10.5)

## 2016-05-07 LAB — MAGNESIUM: Magnesium: 1.7 mg/dL (ref 1.7–2.4)

## 2016-05-07 LAB — COMPREHENSIVE METABOLIC PANEL
ALT: 39 U/L (ref 17–63)
AST: 24 U/L (ref 15–41)
Albumin: 3.6 g/dL (ref 3.5–5.0)
Alkaline Phosphatase: 66 U/L (ref 38–126)
Anion gap: 9 (ref 5–15)
BUN: 20 mg/dL (ref 6–20)
CO2: 29 mmol/L (ref 22–32)
Calcium: 8.9 mg/dL (ref 8.9–10.3)
Chloride: 99 mmol/L — ABNORMAL LOW (ref 101–111)
Creatinine, Ser: 1.15 mg/dL (ref 0.61–1.24)
GFR calc Af Amer: >60 mL/min (ref >60)
GFR calc non Af Amer: >60 mL/min (ref >60)
Glucose, Bld: 261 mg/dL — ABNORMAL HIGH (ref 65–99)
Potassium: 4 mmol/L (ref 3.5–5.1)
Sodium: 137 mmol/L (ref 135–145)
Total Bilirubin: 0.5 mg/dL (ref 0.3–1.2)
Total Protein: 6.3 g/dL — ABNORMAL LOW (ref 6.5–8.1)

## 2016-05-07 MED ORDER — POTASSIUM CHLORIDE 2 MEQ/ML IV SOLN
Freq: Once | INTRAVENOUS | Status: AC
  Administered 2016-05-07: 10:00:00 via INTRAVENOUS
  Filled 2016-05-07: qty 10

## 2016-05-07 MED ORDER — SODIUM CHLORIDE 0.9% FLUSH
10.0000 mL | INTRAVENOUS | Status: DC | PRN
  Administered 2016-05-07: 10 mL
  Filled 2016-05-07: qty 10

## 2016-05-07 MED ORDER — FOSAPREPITANT DIMEGLUMINE INJECTION 150 MG
Freq: Once | INTRAVENOUS | Status: AC
  Administered 2016-05-07: 12:00:00 via INTRAVENOUS
  Filled 2016-05-07: qty 5

## 2016-05-07 MED ORDER — CISPLATIN CHEMO INJECTION 100MG/100ML
60.0000 mg/m2 | Freq: Once | INTRAVENOUS | Status: AC
  Administered 2016-05-07: 159 mg via INTRAVENOUS
  Filled 2016-05-07: qty 100

## 2016-05-07 MED ORDER — SODIUM CHLORIDE 0.9 % IV SOLN
Freq: Once | INTRAVENOUS | Status: AC
  Administered 2016-05-07: 12:00:00 via INTRAVENOUS

## 2016-05-07 MED ORDER — SODIUM CHLORIDE 0.9 % IV SOLN
120.0000 mg/m2 | Freq: Once | INTRAVENOUS | Status: AC
  Administered 2016-05-07: 320 mg via INTRAVENOUS
  Filled 2016-05-07: qty 16

## 2016-05-07 MED ORDER — PALONOSETRON HCL INJECTION 0.25 MG/5ML
0.2500 mg | Freq: Once | INTRAVENOUS | Status: AC
  Administered 2016-05-07: 0.25 mg via INTRAVENOUS
  Filled 2016-05-07: qty 5

## 2016-05-07 MED ORDER — IPRATROPIUM-ALBUTEROL 0.5-2.5 (3) MG/3ML IN SOLN
RESPIRATORY_TRACT | Status: AC
  Filled 2016-05-07: qty 3

## 2016-05-07 MED ORDER — IPRATROPIUM-ALBUTEROL 0.5-2.5 (3) MG/3ML IN SOLN
3.0000 mL | Freq: Four times a day (QID) | RESPIRATORY_TRACT | Status: DC
  Administered 2016-05-07: 3 mL via RESPIRATORY_TRACT

## 2016-05-07 MED ORDER — SODIUM CHLORIDE 0.9 % IV SOLN
60.0000 mg/m2 | Freq: Once | INTRAVENOUS | Status: DC
  Filled 2016-05-07: qty 159

## 2016-05-07 MED ORDER — HEPARIN SOD (PORK) LOCK FLUSH 100 UNIT/ML IV SOLN
500.0000 [IU] | Freq: Once | INTRAVENOUS | Status: AC | PRN
  Administered 2016-05-07: 500 [IU]

## 2016-05-07 NOTE — Progress Notes (Signed)
Chase Herrera tolerated Cisplatin and VP-16 infusions well without complaints or incident. Labs reviewed with Dr. Talbert Cage prior to administering chemotherapy.Pt requested nebulizer tx prior to discharge since he normally takes neb txs every 6 hrs at home. Duo-neb ordered by Kirby Crigler PA-C.VSS upon discharge.Port left accessed,saline locked and flushed per protocol for chemo tx  Tomorrow. Pt discharged via wheelchair in satisfactory condition

## 2016-05-07 NOTE — Progress Notes (Signed)
Recent hospitalization after first tx on 4/3 reviewed with DR. Zhou prior to administering chemotherapy today

## 2016-05-07 NOTE — Patient Instructions (Signed)
[facility] Discharge Instructions for Patients Receiving Chemotherapy   Beginning January 23rd 2017 lab work for the [facility]: [name] will be done in the  Main lab at [facility] on 1st floor. If you have a lab appointment with the [place] please come in thru the  Main Entrance and check in at the main information desk   Today you received the following chemotherapy agents Cisplatin and VP-16. Follow-up as scheduled. Call clinic for any questions or concerns  To help prevent nausea and vomiting after your treatment, we encourage you to take your nausea medication   If you develop nausea and vomiting, or diarrhea that is not controlled by your medication, call the clinic.  The clinic phone number is [phone]. Office hours are Monday-Friday 8:30am-5:00pm.  BELOW ARE SYMPTOMS THAT SHOULD BE REPORTED IMMEDIATELY:  *FEVER GREATER THAN 101.0 F  *CHILLS WITH OR WITHOUT FEVER  NAUSEA AND VOMITING THAT IS NOT CONTROLLED WITH YOUR NAUSEA MEDICATION  *UNUSUAL SHORTNESS OF BREATH  *UNUSUAL BRUISING OR BLEEDING  TENDERNESS IN MOUTH AND THROAT WITH OR WITHOUT PRESENCE OF ULCERS  *URINARY PROBLEMS  *BOWEL PROBLEMS  UNUSUAL RASH Items with * indicate a potential emergency and should be followed up as soon as possible. If you have an emergency after office hours please contact your primary care physician or go to the nearest emergency department.  Please call the clinic during office hours if you have any questions or concerns.   You may also contact the Patient Navigator at [phone] should you have any questions or need assistance in obtaining follow up care.      Resources For Cancer Patients and their Caregivers ? American Cancer Society: Can assist with transportation, wigs, general needs, runs Look Good Feel Better.        ([phone] ? Cancer Care: Provides financial assistance, online support groups, medication/co-pay assistance.   1-800-813-HOPE 661-660-8055) ? [name] Assists [place] cancer patients and their families through emotional , educational and financial support.  (501) 323-3009 ? [place] DSS Where to apply for food stamps, Medicaid and utility assistance. (754)025-7609 ? RCATS: Transportation to medical appointments. 780-885-5676 ? Social Security Administration: May apply for disability if have a Stage IV cancer. 818-618-7332 765-337-1440 ? [employer], Disability and Transit Services: Assists with nutrition, care and transit needs. [phone]

## 2016-05-07 NOTE — Progress Notes (Signed)
Chase Herrera only received 2 hrs pre-hydration prior to Cisplatin per Dr. Talbert Cage since he has a history of CHF. The 2 hrs post-hydration was held per MD order

## 2016-05-08 ENCOUNTER — Encounter (HOSPITAL_BASED_OUTPATIENT_CLINIC_OR_DEPARTMENT_OTHER): Payer: Medicare Other

## 2016-05-08 VITALS — BP 149/63 | HR 78 | Temp 98.1°F | Resp 18 | Wt 301.2 lb

## 2016-05-08 DIAGNOSIS — Z5111 Encounter for antineoplastic chemotherapy: Secondary | ICD-10-CM

## 2016-05-08 DIAGNOSIS — C3491 Malignant neoplasm of unspecified part of right bronchus or lung: Secondary | ICD-10-CM | POA: Diagnosis not present

## 2016-05-08 DIAGNOSIS — Z7901 Long term (current) use of anticoagulants: Secondary | ICD-10-CM | POA: Diagnosis not present

## 2016-05-08 DIAGNOSIS — J449 Chronic obstructive pulmonary disease, unspecified: Secondary | ICD-10-CM | POA: Diagnosis not present

## 2016-05-08 DIAGNOSIS — I4891 Unspecified atrial fibrillation: Secondary | ICD-10-CM | POA: Diagnosis not present

## 2016-05-08 DIAGNOSIS — Z510 Encounter for antineoplastic radiation therapy: Secondary | ICD-10-CM | POA: Diagnosis not present

## 2016-05-08 DIAGNOSIS — C3411 Malignant neoplasm of upper lobe, right bronchus or lung: Secondary | ICD-10-CM | POA: Diagnosis not present

## 2016-05-08 DIAGNOSIS — I509 Heart failure, unspecified: Secondary | ICD-10-CM | POA: Diagnosis not present

## 2016-05-08 MED ORDER — HEPARIN SOD (PORK) LOCK FLUSH 100 UNIT/ML IV SOLN
INTRAVENOUS | Status: AC
  Filled 2016-05-08: qty 5

## 2016-05-08 MED ORDER — HEPARIN SOD (PORK) LOCK FLUSH 100 UNIT/ML IV SOLN
500.0000 [IU] | Freq: Once | INTRAVENOUS | Status: AC | PRN
  Administered 2016-05-08: 500 [IU]

## 2016-05-08 MED ORDER — SODIUM CHLORIDE 0.9 % IV SOLN
120.0000 mg/m2 | Freq: Once | INTRAVENOUS | Status: AC
  Administered 2016-05-08: 320 mg via INTRAVENOUS
  Filled 2016-05-08: qty 16

## 2016-05-08 MED ORDER — DEXAMETHASONE SODIUM PHOSPHATE 10 MG/ML IJ SOLN
INTRAMUSCULAR | Status: AC
  Filled 2016-05-08: qty 1

## 2016-05-08 MED ORDER — SODIUM CHLORIDE 0.9% FLUSH
10.0000 mL | INTRAVENOUS | Status: DC | PRN
  Administered 2016-05-08: 10 mL
  Filled 2016-05-08: qty 10

## 2016-05-08 MED ORDER — DEXAMETHASONE SODIUM PHOSPHATE 10 MG/ML IJ SOLN
10.0000 mg | Freq: Once | INTRAMUSCULAR | Status: AC
  Administered 2016-05-08: 10 mg via INTRAVENOUS

## 2016-05-08 MED ORDER — SODIUM CHLORIDE 0.9 % IV SOLN
Freq: Once | INTRAVENOUS | Status: AC
  Administered 2016-05-08: 10:00:00 via INTRAVENOUS

## 2016-05-08 MED ORDER — SODIUM CHLORIDE 0.9 % IV SOLN: 10.0000 mg | Freq: Once | INTRAVENOUS | Status: DC

## 2016-05-08 NOTE — Progress Notes (Signed)
Pt tolerated VP16 without any problems.   Portacath located right chest wall flushed with 78m NS and 500U/523mHeparin post chemo infusion. Port needle left accessed in port and secured with hypafix tape. Procedure without incident. Patient tolerated procedure well.

## 2016-05-08 NOTE — Patient Instructions (Signed)
[facility] Discharge Instructions for Patients Receiving Chemotherapy   Beginning January 23rd 2017 lab work for the [facility] will be done in the  Main lab at [facility] on 1st floor. If you have a lab appointment with the [place] please come in thru the  Main Entrance and check in at the main information desk   Today you received the following chemotherapy agents etoposide day 2.  If you develop nausea and vomiting, or diarrhea that is not controlled by your medication, call the clinic.  The clinic phone number is [phone]. Office hours are Monday-Friday 8:30am-5:00pm.  BELOW ARE SYMPTOMS THAT SHOULD BE REPORTED IMMEDIATELY:  *FEVER GREATER THAN 101.0 F  *CHILLS WITH OR WITHOUT FEVER  NAUSEA AND VOMITING THAT IS NOT CONTROLLED WITH YOUR NAUSEA MEDICATION  *UNUSUAL SHORTNESS OF BREATH  *UNUSUAL BRUISING OR BLEEDING  TENDERNESS IN MOUTH AND THROAT WITH OR WITHOUT PRESENCE OF ULCERS  *URINARY PROBLEMS  *BOWEL PROBLEMS  UNUSUAL RASH Items with * indicate a potential emergency and should be followed up as soon as possible. If you have an emergency after office hours please contact your primary care physician or go to the nearest emergency department.  Please call the clinic during office hours if you have any questions or concerns.   You may also contact the Patient Navigator at [phone] should you have any questions or need assistance in obtaining follow up care.      Resources For Cancer Patients and their Caregivers ? American Cancer Society: Can assist with transportation, wigs, general needs, runs Look Good Feel Better.        2768378717 ? Cancer Care: Provides financial assistance, online support groups, medication/co-pay assistance.  1-800-813-HOPE (725)496-4842) ? [place] Assists [place] cancer patients and their families through emotional , educational and financial support.   9013068412 ? [place] DSS Where to apply for food stamps, Medicaid and utility assistance. ([phone] 547-4370 ? RCATS: Transportation to medical appointments. 913-591-7192 ? Social Security Administration: May apply for disability if have a Stage IV cancer. [phone] ? [employer], Disability and Transit Services: Assists with nutrition, care and transit needs. [phone]

## 2016-05-08 NOTE — Progress Notes (Signed)
Denies complaints post chemo yesterday, states he feels pretty good today.  Tolerated chemo well. Stable on discharge home to self via wheelchair.

## 2016-05-09 ENCOUNTER — Encounter (HOSPITAL_BASED_OUTPATIENT_CLINIC_OR_DEPARTMENT_OTHER): Payer: Medicare Other

## 2016-05-09 VITALS — BP 125/54 | HR 81 | Temp 97.5°F | Resp 18 | Wt 300.6 lb

## 2016-05-09 DIAGNOSIS — Z5189 Encounter for other specified aftercare: Secondary | ICD-10-CM | POA: Diagnosis not present

## 2016-05-09 DIAGNOSIS — C3491 Malignant neoplasm of unspecified part of right bronchus or lung: Secondary | ICD-10-CM | POA: Diagnosis not present

## 2016-05-09 DIAGNOSIS — Z5111 Encounter for antineoplastic chemotherapy: Secondary | ICD-10-CM | POA: Diagnosis not present

## 2016-05-09 DIAGNOSIS — Z510 Encounter for antineoplastic radiation therapy: Secondary | ICD-10-CM | POA: Diagnosis not present

## 2016-05-09 DIAGNOSIS — J449 Chronic obstructive pulmonary disease, unspecified: Secondary | ICD-10-CM | POA: Diagnosis not present

## 2016-05-09 DIAGNOSIS — Z7901 Long term (current) use of anticoagulants: Secondary | ICD-10-CM | POA: Diagnosis not present

## 2016-05-09 DIAGNOSIS — I4891 Unspecified atrial fibrillation: Secondary | ICD-10-CM | POA: Diagnosis not present

## 2016-05-09 DIAGNOSIS — I509 Heart failure, unspecified: Secondary | ICD-10-CM | POA: Diagnosis not present

## 2016-05-09 DIAGNOSIS — C3411 Malignant neoplasm of upper lobe, right bronchus or lung: Secondary | ICD-10-CM | POA: Diagnosis not present

## 2016-05-09 MED ORDER — OXYCODONE-ACETAMINOPHEN 5-325 MG PO TABS: 1.0000 | ORAL_TABLET | ORAL | 0 refills | Status: DC | PRN

## 2016-05-09 MED ORDER — SODIUM CHLORIDE 0.9 % IV SOLN
Freq: Once | INTRAVENOUS | Status: AC
  Administered 2016-05-09: 11:00:00 via INTRAVENOUS

## 2016-05-09 MED ORDER — SODIUM CHLORIDE 0.9 % IV SOLN: 10.0000 mg | Freq: Once | INTRAVENOUS | Status: DC

## 2016-05-09 MED ORDER — PEGFILGRASTIM 6 MG/0.6ML ~~LOC~~ PSKT
6.0000 mg | PREFILLED_SYRINGE | Freq: Once | SUBCUTANEOUS | Status: AC
  Administered 2016-05-09: 6 mg via SUBCUTANEOUS
  Filled 2016-05-09: qty 0.6

## 2016-05-09 MED ORDER — DEXAMETHASONE SODIUM PHOSPHATE 10 MG/ML IJ SOLN
10.0000 mg | Freq: Once | INTRAMUSCULAR | Status: AC
  Administered 2016-05-09: 10 mg via INTRAVENOUS

## 2016-05-09 MED ORDER — SODIUM CHLORIDE 0.9% FLUSH
10.0000 mL | INTRAVENOUS | Status: DC | PRN
  Administered 2016-05-09: 10 mL
  Filled 2016-05-09: qty 10

## 2016-05-09 MED ORDER — SODIUM CHLORIDE 0.9 % IV SOLN
120.0000 mg/m2 | Freq: Once | INTRAVENOUS | Status: AC
  Administered 2016-05-09: 320 mg via INTRAVENOUS
  Filled 2016-05-09: qty 16

## 2016-05-09 MED ORDER — HEPARIN SOD (PORK) LOCK FLUSH 100 UNIT/ML IV SOLN
500.0000 [IU] | Freq: Once | INTRAVENOUS | Status: AC | PRN
  Administered 2016-05-09: 500 [IU]
  Filled 2016-05-09: qty 5

## 2016-05-09 MED ORDER — DEXAMETHASONE SODIUM PHOSPHATE 10 MG/ML IJ SOLN
INTRAMUSCULAR | Status: AC
  Filled 2016-05-09: qty 1

## 2016-05-09 NOTE — Patient Instructions (Signed)
[facility] Discharge Instructions for Patients Receiving Chemotherapy   Beginning January 23rd 2017 lab work for the Cancer Center will be done in the  Main lab at [place] on 1st floor. If you have a lab appointment with the Cancer Center please come in thru the  Main Entrance and check in at the main information desk   Today you received the following chemotherapy agents VP-16 as well as Neulasta on-pro.Follow-up as scheduled. Call clinic for any questions or concerns  To help prevent nausea and vomiting after your treatment, we encourage you to take your nausea medication   If you develop nausea and vomiting, or diarrhea that is not controlled by your medication, call the clinic.  The clinic phone number is [phone]. Office hours are Monday-Friday 8:30am-5:00pm.  BELOW ARE SYMPTOMS THAT SHOULD BE REPORTED IMMEDIATELY:  *FEVER GREATER THAN 101.0 F  *CHILLS WITH OR WITHOUT FEVER  NAUSEA AND VOMITING THAT IS NOT CONTROLLED WITH YOUR NAUSEA MEDICATION  *UNUSUAL SHORTNESS OF BREATH  *UNUSUAL BRUISING OR BLEEDING  TENDERNESS IN MOUTH AND THROAT WITH OR WITHOUT PRESENCE OF ULCERS  *URINARY PROBLEMS  *BOWEL PROBLEMS  UNUSUAL RASH Items with * indicate a potential emergency and should be followed up as soon as possible. If you have an emergency after office hours please contact your primary care physician or go to the nearest emergency department.  Please call the clinic during office hours if you have any questions or concerns.   You may also contact the Patient Navigator at [phone] should you have any questions or need assistance in obtaining follow up care.      Resources For Cancer Patients and their Caregivers ? American Cancer Society: Can assist with transportation, wigs, general needs, runs Look Good Feel Better.        1-888-227-6333 ? Cancer Care: Provides financial assistance, online support groups, medication/co-pay assistance.   1-800-813-HOPE (4673) ? [facility] Assists [place] cancer patients and their families through emotional , educational and financial support.  [phone] ? [place] DSS Where to apply for food stamps, Medicaid and utility assistance. 336-342-1394 ? RCATS: Transportation to medical appointments. [phone] ? Social Security Administration: May apply for disability if have a Stage IV cancer. [phone] 1-800-772-1213 ? [place] Aging, Disability and Transit Services: Assists with nutrition, care and transit needs. [phone]         

## 2016-05-09 NOTE — Progress Notes (Signed)
Chase Herrera tolerated chemo tx with Neulasta on-pro well without complaints or incident. VSS upon discharge.Neulasta on-pro applied to pt's right arm with green indicator light flashing Pt aware of when to remove Neulasta.Pt discharged via wheelchair in satisfactory condition

## 2016-05-10 DIAGNOSIS — I4891 Unspecified atrial fibrillation: Secondary | ICD-10-CM | POA: Diagnosis not present

## 2016-05-10 DIAGNOSIS — Z7901 Long term (current) use of anticoagulants: Secondary | ICD-10-CM | POA: Diagnosis not present

## 2016-05-10 DIAGNOSIS — C3411 Malignant neoplasm of upper lobe, right bronchus or lung: Secondary | ICD-10-CM | POA: Diagnosis not present

## 2016-05-10 DIAGNOSIS — J449 Chronic obstructive pulmonary disease, unspecified: Secondary | ICD-10-CM | POA: Diagnosis not present

## 2016-05-10 DIAGNOSIS — I509 Heart failure, unspecified: Secondary | ICD-10-CM | POA: Diagnosis not present

## 2016-05-10 DIAGNOSIS — Z510 Encounter for antineoplastic radiation therapy: Secondary | ICD-10-CM | POA: Diagnosis not present

## 2016-05-11 DIAGNOSIS — I4891 Unspecified atrial fibrillation: Secondary | ICD-10-CM | POA: Diagnosis not present

## 2016-05-11 DIAGNOSIS — Z7901 Long term (current) use of anticoagulants: Secondary | ICD-10-CM | POA: Diagnosis not present

## 2016-05-11 DIAGNOSIS — J449 Chronic obstructive pulmonary disease, unspecified: Secondary | ICD-10-CM | POA: Diagnosis not present

## 2016-05-11 DIAGNOSIS — C3411 Malignant neoplasm of upper lobe, right bronchus or lung: Secondary | ICD-10-CM | POA: Diagnosis not present

## 2016-05-11 DIAGNOSIS — I509 Heart failure, unspecified: Secondary | ICD-10-CM | POA: Diagnosis not present

## 2016-05-11 DIAGNOSIS — Z510 Encounter for antineoplastic radiation therapy: Secondary | ICD-10-CM | POA: Diagnosis not present

## 2016-05-14 DIAGNOSIS — Z510 Encounter for antineoplastic radiation therapy: Secondary | ICD-10-CM | POA: Diagnosis not present

## 2016-05-14 DIAGNOSIS — I509 Heart failure, unspecified: Secondary | ICD-10-CM | POA: Diagnosis not present

## 2016-05-14 DIAGNOSIS — J449 Chronic obstructive pulmonary disease, unspecified: Secondary | ICD-10-CM | POA: Diagnosis not present

## 2016-05-14 DIAGNOSIS — Z7901 Long term (current) use of anticoagulants: Secondary | ICD-10-CM | POA: Diagnosis not present

## 2016-05-14 DIAGNOSIS — C3411 Malignant neoplasm of upper lobe, right bronchus or lung: Secondary | ICD-10-CM | POA: Diagnosis not present

## 2016-05-14 DIAGNOSIS — I4891 Unspecified atrial fibrillation: Secondary | ICD-10-CM | POA: Diagnosis not present

## 2016-05-15 DIAGNOSIS — C3411 Malignant neoplasm of upper lobe, right bronchus or lung: Secondary | ICD-10-CM | POA: Diagnosis not present

## 2016-05-15 DIAGNOSIS — Z510 Encounter for antineoplastic radiation therapy: Secondary | ICD-10-CM | POA: Diagnosis not present

## 2016-05-15 DIAGNOSIS — Z7901 Long term (current) use of anticoagulants: Secondary | ICD-10-CM | POA: Diagnosis not present

## 2016-05-15 DIAGNOSIS — I509 Heart failure, unspecified: Secondary | ICD-10-CM | POA: Diagnosis not present

## 2016-05-15 DIAGNOSIS — J449 Chronic obstructive pulmonary disease, unspecified: Secondary | ICD-10-CM | POA: Diagnosis not present

## 2016-05-15 DIAGNOSIS — I4891 Unspecified atrial fibrillation: Secondary | ICD-10-CM | POA: Diagnosis not present

## 2016-05-16 DIAGNOSIS — C3411 Malignant neoplasm of upper lobe, right bronchus or lung: Secondary | ICD-10-CM | POA: Diagnosis not present

## 2016-05-16 DIAGNOSIS — J449 Chronic obstructive pulmonary disease, unspecified: Secondary | ICD-10-CM | POA: Diagnosis not present

## 2016-05-16 DIAGNOSIS — Z510 Encounter for antineoplastic radiation therapy: Secondary | ICD-10-CM | POA: Diagnosis not present

## 2016-05-16 DIAGNOSIS — I4891 Unspecified atrial fibrillation: Secondary | ICD-10-CM | POA: Diagnosis not present

## 2016-05-16 DIAGNOSIS — I509 Heart failure, unspecified: Secondary | ICD-10-CM | POA: Diagnosis not present

## 2016-05-16 DIAGNOSIS — Z7901 Long term (current) use of anticoagulants: Secondary | ICD-10-CM | POA: Diagnosis not present

## 2016-05-17 DIAGNOSIS — I4891 Unspecified atrial fibrillation: Secondary | ICD-10-CM | POA: Diagnosis not present

## 2016-05-17 DIAGNOSIS — C3411 Malignant neoplasm of upper lobe, right bronchus or lung: Secondary | ICD-10-CM | POA: Diagnosis not present

## 2016-05-17 DIAGNOSIS — I509 Heart failure, unspecified: Secondary | ICD-10-CM | POA: Diagnosis not present

## 2016-05-17 DIAGNOSIS — Z510 Encounter for antineoplastic radiation therapy: Secondary | ICD-10-CM | POA: Diagnosis not present

## 2016-05-17 DIAGNOSIS — J449 Chronic obstructive pulmonary disease, unspecified: Secondary | ICD-10-CM | POA: Diagnosis not present

## 2016-05-17 DIAGNOSIS — Z7901 Long term (current) use of anticoagulants: Secondary | ICD-10-CM | POA: Diagnosis not present

## 2016-05-18 DIAGNOSIS — I4891 Unspecified atrial fibrillation: Secondary | ICD-10-CM | POA: Diagnosis not present

## 2016-05-18 DIAGNOSIS — I509 Heart failure, unspecified: Secondary | ICD-10-CM | POA: Diagnosis not present

## 2016-05-18 DIAGNOSIS — Z510 Encounter for antineoplastic radiation therapy: Secondary | ICD-10-CM | POA: Diagnosis not present

## 2016-05-18 DIAGNOSIS — C3411 Malignant neoplasm of upper lobe, right bronchus or lung: Secondary | ICD-10-CM | POA: Diagnosis not present

## 2016-05-18 DIAGNOSIS — Z7901 Long term (current) use of anticoagulants: Secondary | ICD-10-CM | POA: Diagnosis not present

## 2016-05-18 DIAGNOSIS — J449 Chronic obstructive pulmonary disease, unspecified: Secondary | ICD-10-CM | POA: Diagnosis not present

## 2016-05-21 DIAGNOSIS — C3411 Malignant neoplasm of upper lobe, right bronchus or lung: Secondary | ICD-10-CM | POA: Diagnosis not present

## 2016-05-21 DIAGNOSIS — I4891 Unspecified atrial fibrillation: Secondary | ICD-10-CM | POA: Diagnosis not present

## 2016-05-21 DIAGNOSIS — Z7901 Long term (current) use of anticoagulants: Secondary | ICD-10-CM | POA: Diagnosis not present

## 2016-05-21 DIAGNOSIS — J449 Chronic obstructive pulmonary disease, unspecified: Secondary | ICD-10-CM | POA: Diagnosis not present

## 2016-05-21 DIAGNOSIS — J984 Other disorders of lung: Secondary | ICD-10-CM | POA: Diagnosis not present

## 2016-05-21 DIAGNOSIS — Z510 Encounter for antineoplastic radiation therapy: Secondary | ICD-10-CM | POA: Diagnosis not present

## 2016-05-21 DIAGNOSIS — I509 Heart failure, unspecified: Secondary | ICD-10-CM | POA: Diagnosis not present

## 2016-05-22 ENCOUNTER — Telehealth (HOSPITAL_COMMUNITY): Payer: Self-pay | Admitting: Oncology

## 2016-05-22 DIAGNOSIS — I482 Chronic atrial fibrillation: Secondary | ICD-10-CM | POA: Diagnosis not present

## 2016-05-22 DIAGNOSIS — I5041 Acute combined systolic (congestive) and diastolic (congestive) heart failure: Secondary | ICD-10-CM | POA: Diagnosis not present

## 2016-05-22 DIAGNOSIS — K449 Diaphragmatic hernia without obstruction or gangrene: Secondary | ICD-10-CM | POA: Diagnosis not present

## 2016-05-22 DIAGNOSIS — I5040 Unspecified combined systolic (congestive) and diastolic (congestive) heart failure: Secondary | ICD-10-CM | POA: Diagnosis not present

## 2016-05-22 DIAGNOSIS — J441 Chronic obstructive pulmonary disease with (acute) exacerbation: Secondary | ICD-10-CM | POA: Diagnosis not present

## 2016-05-22 DIAGNOSIS — Z7901 Long term (current) use of anticoagulants: Secondary | ICD-10-CM | POA: Diagnosis not present

## 2016-05-22 DIAGNOSIS — Z801 Family history of malignant neoplasm of trachea, bronchus and lung: Secondary | ICD-10-CM | POA: Diagnosis not present

## 2016-05-22 DIAGNOSIS — Z794 Long term (current) use of insulin: Secondary | ICD-10-CM | POA: Diagnosis not present

## 2016-05-22 DIAGNOSIS — Z7982 Long term (current) use of aspirin: Secondary | ICD-10-CM | POA: Diagnosis not present

## 2016-05-22 DIAGNOSIS — Z79899 Other long term (current) drug therapy: Secondary | ICD-10-CM | POA: Diagnosis not present

## 2016-05-22 DIAGNOSIS — R531 Weakness: Secondary | ICD-10-CM | POA: Diagnosis not present

## 2016-05-22 DIAGNOSIS — Z8249 Family history of ischemic heart disease and other diseases of the circulatory system: Secondary | ICD-10-CM | POA: Diagnosis not present

## 2016-05-22 DIAGNOSIS — I110 Hypertensive heart disease with heart failure: Secondary | ICD-10-CM | POA: Diagnosis not present

## 2016-05-22 DIAGNOSIS — Z886 Allergy status to analgesic agent status: Secondary | ICD-10-CM | POA: Diagnosis not present

## 2016-05-22 DIAGNOSIS — C3411 Malignant neoplasm of upper lobe, right bronchus or lung: Secondary | ICD-10-CM | POA: Diagnosis not present

## 2016-05-22 DIAGNOSIS — E119 Type 2 diabetes mellitus without complications: Secondary | ICD-10-CM | POA: Diagnosis not present

## 2016-05-22 DIAGNOSIS — C3490 Malignant neoplasm of unspecified part of unspecified bronchus or lung: Secondary | ICD-10-CM | POA: Diagnosis not present

## 2016-05-22 DIAGNOSIS — Z836 Family history of other diseases of the respiratory system: Secondary | ICD-10-CM | POA: Diagnosis not present

## 2016-05-22 NOTE — Telephone Encounter (Signed)
Radiation oncology called about this patient.  Is reported an increase in shortness of breath.  Chest x-ray was performed demonstrating signs of exacerbation of congestive heart failure.  Radiation oncology discussed patient's case with primary care physician who did not want to change any of his medications at that time.  Of note, radiation oncology reports an increase in white blood cell count of approximately 10 and platelet count of 77,000.  He does want to make [name] aware as the patient has an appoint with [name] on Monday.  KEFALAS,THOMAS, PA-C 05/22/2016 9:40 AM

## 2016-05-23 DIAGNOSIS — C3490 Malignant neoplasm of unspecified part of unspecified bronchus or lung: Secondary | ICD-10-CM | POA: Diagnosis not present

## 2016-05-23 DIAGNOSIS — I110 Hypertensive heart disease with heart failure: Secondary | ICD-10-CM | POA: Diagnosis not present

## 2016-05-23 DIAGNOSIS — Z79899 Other long term (current) drug therapy: Secondary | ICD-10-CM | POA: Diagnosis not present

## 2016-05-23 DIAGNOSIS — K449 Diaphragmatic hernia without obstruction or gangrene: Secondary | ICD-10-CM | POA: Diagnosis not present

## 2016-05-23 DIAGNOSIS — Z801 Family history of malignant neoplasm of trachea, bronchus and lung: Secondary | ICD-10-CM | POA: Diagnosis not present

## 2016-05-23 DIAGNOSIS — Z886 Allergy status to analgesic agent status: Secondary | ICD-10-CM | POA: Diagnosis not present

## 2016-05-23 DIAGNOSIS — I5040 Unspecified combined systolic (congestive) and diastolic (congestive) heart failure: Secondary | ICD-10-CM | POA: Diagnosis not present

## 2016-05-23 DIAGNOSIS — Z794 Long term (current) use of insulin: Secondary | ICD-10-CM | POA: Diagnosis not present

## 2016-05-23 DIAGNOSIS — E119 Type 2 diabetes mellitus without complications: Secondary | ICD-10-CM | POA: Diagnosis not present

## 2016-05-23 DIAGNOSIS — Z7901 Long term (current) use of anticoagulants: Secondary | ICD-10-CM | POA: Diagnosis not present

## 2016-05-23 DIAGNOSIS — I5041 Acute combined systolic (congestive) and diastolic (congestive) heart failure: Secondary | ICD-10-CM | POA: Diagnosis not present

## 2016-05-23 DIAGNOSIS — I482 Chronic atrial fibrillation: Secondary | ICD-10-CM | POA: Diagnosis not present

## 2016-05-23 DIAGNOSIS — Z8249 Family history of ischemic heart disease and other diseases of the circulatory system: Secondary | ICD-10-CM | POA: Diagnosis not present

## 2016-05-23 DIAGNOSIS — J441 Chronic obstructive pulmonary disease with (acute) exacerbation: Secondary | ICD-10-CM | POA: Diagnosis not present

## 2016-05-23 DIAGNOSIS — Z836 Family history of other diseases of the respiratory system: Secondary | ICD-10-CM | POA: Diagnosis not present

## 2016-05-23 DIAGNOSIS — Z7982 Long term (current) use of aspirin: Secondary | ICD-10-CM | POA: Diagnosis not present

## 2016-05-23 DIAGNOSIS — C3411 Malignant neoplasm of upper lobe, right bronchus or lung: Secondary | ICD-10-CM | POA: Diagnosis not present

## 2016-05-24 DIAGNOSIS — Z79899 Other long term (current) drug therapy: Secondary | ICD-10-CM | POA: Diagnosis not present

## 2016-05-24 DIAGNOSIS — Z801 Family history of malignant neoplasm of trachea, bronchus and lung: Secondary | ICD-10-CM | POA: Diagnosis not present

## 2016-05-24 DIAGNOSIS — I5041 Acute combined systolic (congestive) and diastolic (congestive) heart failure: Secondary | ICD-10-CM | POA: Diagnosis not present

## 2016-05-24 DIAGNOSIS — C3490 Malignant neoplasm of unspecified part of unspecified bronchus or lung: Secondary | ICD-10-CM | POA: Diagnosis not present

## 2016-05-24 DIAGNOSIS — Z886 Allergy status to analgesic agent status: Secondary | ICD-10-CM | POA: Diagnosis not present

## 2016-05-24 DIAGNOSIS — C3411 Malignant neoplasm of upper lobe, right bronchus or lung: Secondary | ICD-10-CM | POA: Diagnosis not present

## 2016-05-24 DIAGNOSIS — K449 Diaphragmatic hernia without obstruction or gangrene: Secondary | ICD-10-CM | POA: Diagnosis not present

## 2016-05-24 DIAGNOSIS — I482 Chronic atrial fibrillation: Secondary | ICD-10-CM | POA: Diagnosis not present

## 2016-05-24 DIAGNOSIS — E119 Type 2 diabetes mellitus without complications: Secondary | ICD-10-CM | POA: Diagnosis not present

## 2016-05-24 DIAGNOSIS — Z836 Family history of other diseases of the respiratory system: Secondary | ICD-10-CM | POA: Diagnosis not present

## 2016-05-24 DIAGNOSIS — Z7901 Long term (current) use of anticoagulants: Secondary | ICD-10-CM | POA: Diagnosis not present

## 2016-05-24 DIAGNOSIS — Z7982 Long term (current) use of aspirin: Secondary | ICD-10-CM | POA: Diagnosis not present

## 2016-05-24 DIAGNOSIS — I5040 Unspecified combined systolic (congestive) and diastolic (congestive) heart failure: Secondary | ICD-10-CM | POA: Diagnosis not present

## 2016-05-24 DIAGNOSIS — I110 Hypertensive heart disease with heart failure: Secondary | ICD-10-CM | POA: Diagnosis not present

## 2016-05-24 DIAGNOSIS — Z794 Long term (current) use of insulin: Secondary | ICD-10-CM | POA: Diagnosis not present

## 2016-05-24 DIAGNOSIS — Z8249 Family history of ischemic heart disease and other diseases of the circulatory system: Secondary | ICD-10-CM | POA: Diagnosis not present

## 2016-05-24 DIAGNOSIS — J441 Chronic obstructive pulmonary disease with (acute) exacerbation: Secondary | ICD-10-CM | POA: Diagnosis not present

## 2016-05-25 DIAGNOSIS — Z9981 Dependence on supplemental oxygen: Secondary | ICD-10-CM | POA: Diagnosis not present

## 2016-05-25 DIAGNOSIS — I482 Chronic atrial fibrillation: Secondary | ICD-10-CM | POA: Diagnosis not present

## 2016-05-25 DIAGNOSIS — C3491 Malignant neoplasm of unspecified part of right bronchus or lung: Secondary | ICD-10-CM | POA: Diagnosis not present

## 2016-05-25 DIAGNOSIS — I5040 Unspecified combined systolic (congestive) and diastolic (congestive) heart failure: Secondary | ICD-10-CM | POA: Diagnosis not present

## 2016-05-25 DIAGNOSIS — J439 Emphysema, unspecified: Secondary | ICD-10-CM | POA: Diagnosis not present

## 2016-05-25 DIAGNOSIS — J441 Chronic obstructive pulmonary disease with (acute) exacerbation: Secondary | ICD-10-CM | POA: Diagnosis not present

## 2016-05-25 DIAGNOSIS — E119 Type 2 diabetes mellitus without complications: Secondary | ICD-10-CM | POA: Diagnosis not present

## 2016-05-25 DIAGNOSIS — C3411 Malignant neoplasm of upper lobe, right bronchus or lung: Secondary | ICD-10-CM | POA: Diagnosis not present

## 2016-05-25 DIAGNOSIS — Z510 Encounter for antineoplastic radiation therapy: Secondary | ICD-10-CM | POA: Diagnosis not present

## 2016-05-25 DIAGNOSIS — I110 Hypertensive heart disease with heart failure: Secondary | ICD-10-CM | POA: Diagnosis not present

## 2016-05-25 DIAGNOSIS — Z794 Long term (current) use of insulin: Secondary | ICD-10-CM | POA: Diagnosis not present

## 2016-05-26 DIAGNOSIS — I5040 Unspecified combined systolic (congestive) and diastolic (congestive) heart failure: Secondary | ICD-10-CM | POA: Diagnosis not present

## 2016-05-26 DIAGNOSIS — E119 Type 2 diabetes mellitus without complications: Secondary | ICD-10-CM | POA: Diagnosis not present

## 2016-05-26 DIAGNOSIS — C3491 Malignant neoplasm of unspecified part of right bronchus or lung: Secondary | ICD-10-CM | POA: Diagnosis not present

## 2016-05-26 DIAGNOSIS — J441 Chronic obstructive pulmonary disease with (acute) exacerbation: Secondary | ICD-10-CM | POA: Diagnosis not present

## 2016-05-26 DIAGNOSIS — I482 Chronic atrial fibrillation: Secondary | ICD-10-CM | POA: Diagnosis not present

## 2016-05-26 DIAGNOSIS — Z9981 Dependence on supplemental oxygen: Secondary | ICD-10-CM | POA: Diagnosis not present

## 2016-05-26 DIAGNOSIS — Z794 Long term (current) use of insulin: Secondary | ICD-10-CM | POA: Diagnosis not present

## 2016-05-26 DIAGNOSIS — I110 Hypertensive heart disease with heart failure: Secondary | ICD-10-CM | POA: Diagnosis not present

## 2016-05-28 ENCOUNTER — Encounter (HOSPITAL_COMMUNITY): Payer: Medicare Other | Attending: Oncology

## 2016-05-28 ENCOUNTER — Encounter (HOSPITAL_COMMUNITY): Payer: Self-pay

## 2016-05-28 VITALS — BP 150/70 | HR 72 | Temp 98.3°F | Resp 18 | Wt 293.4 lb

## 2016-05-28 DIAGNOSIS — Z794 Long term (current) use of insulin: Secondary | ICD-10-CM | POA: Diagnosis not present

## 2016-05-28 DIAGNOSIS — Z7982 Long term (current) use of aspirin: Secondary | ICD-10-CM | POA: Diagnosis not present

## 2016-05-28 DIAGNOSIS — C3411 Malignant neoplasm of upper lobe, right bronchus or lung: Secondary | ICD-10-CM | POA: Diagnosis not present

## 2016-05-28 DIAGNOSIS — F1010 Alcohol abuse, uncomplicated: Secondary | ICD-10-CM | POA: Diagnosis not present

## 2016-05-28 DIAGNOSIS — I480 Paroxysmal atrial fibrillation: Secondary | ICD-10-CM | POA: Insufficient documentation

## 2016-05-28 DIAGNOSIS — F329 Major depressive disorder, single episode, unspecified: Secondary | ICD-10-CM | POA: Insufficient documentation

## 2016-05-28 DIAGNOSIS — J449 Chronic obstructive pulmonary disease, unspecified: Secondary | ICD-10-CM | POA: Diagnosis not present

## 2016-05-28 DIAGNOSIS — Z87891 Personal history of nicotine dependence: Secondary | ICD-10-CM | POA: Diagnosis not present

## 2016-05-28 DIAGNOSIS — E119 Type 2 diabetes mellitus without complications: Secondary | ICD-10-CM | POA: Insufficient documentation

## 2016-05-28 DIAGNOSIS — Z5111 Encounter for antineoplastic chemotherapy: Secondary | ICD-10-CM | POA: Diagnosis present

## 2016-05-28 DIAGNOSIS — C3491 Malignant neoplasm of unspecified part of right bronchus or lung: Secondary | ICD-10-CM | POA: Insufficient documentation

## 2016-05-28 DIAGNOSIS — I429 Cardiomyopathy, unspecified: Secondary | ICD-10-CM | POA: Diagnosis not present

## 2016-05-28 DIAGNOSIS — Z9889 Other specified postprocedural states: Secondary | ICD-10-CM | POA: Diagnosis not present

## 2016-05-28 DIAGNOSIS — I4892 Unspecified atrial flutter: Secondary | ICD-10-CM | POA: Diagnosis not present

## 2016-05-28 DIAGNOSIS — Z510 Encounter for antineoplastic radiation therapy: Secondary | ICD-10-CM | POA: Diagnosis not present

## 2016-05-28 LAB — CBC WITH DIFFERENTIAL/PLATELET
Basophils Absolute: 0 10*3/uL (ref 0.0–0.1)
Basophils Relative: 0 %
Eosinophils Absolute: 0 10*3/uL (ref 0.0–0.7)
Eosinophils Relative: 0 %
HCT: 34.3 % — ABNORMAL LOW (ref 39.0–52.0)
Hemoglobin: 11.2 g/dL — ABNORMAL LOW (ref 13.0–17.0)
Lymphocytes Relative: 8 %
Lymphs Abs: 0.8 10*3/uL (ref 0.7–4.0)
MCH: 32.7 pg (ref 26.0–34.0)
MCHC: 32.7 g/dL (ref 30.0–36.0)
MCV: 100 fL (ref 78.0–100.0)
Monocytes Absolute: 1.1 10*3/uL — ABNORMAL HIGH (ref 0.1–1.0)
Monocytes Relative: 12 %
Neutro Abs: 7.3 10*3/uL (ref 1.7–7.7)
Neutrophils Relative %: 80 %
Platelets: 283 10*3/uL (ref 150–400)
RBC: 3.43 MIL/uL — ABNORMAL LOW (ref 4.22–5.81)
RDW: 17.2 % — ABNORMAL HIGH (ref 11.5–15.5)
WBC: 9.1 10*3/uL (ref 4.0–10.5)

## 2016-05-28 LAB — COMPREHENSIVE METABOLIC PANEL
ALT: 32 U/L (ref 17–63)
AST: 26 U/L (ref 15–41)
Albumin: 3.5 g/dL (ref 3.5–5.0)
Alkaline Phosphatase: 53 U/L (ref 38–126)
Anion gap: 9 (ref 5–15)
BUN: 23 mg/dL — ABNORMAL HIGH (ref 6–20)
CO2: 31 mmol/L (ref 22–32)
Calcium: 8.8 mg/dL — ABNORMAL LOW (ref 8.9–10.3)
Chloride: 96 mmol/L — ABNORMAL LOW (ref 101–111)
Creatinine, Ser: 1.17 mg/dL (ref 0.61–1.24)
GFR calc Af Amer: >60 mL/min (ref >60)
GFR calc non Af Amer: >60 mL/min (ref >60)
Glucose, Bld: 193 mg/dL — ABNORMAL HIGH (ref 65–99)
Potassium: 4.2 mmol/L (ref 3.5–5.1)
Sodium: 136 mmol/L (ref 135–145)
Total Bilirubin: 0.4 mg/dL (ref 0.3–1.2)
Total Protein: 5.9 g/dL — ABNORMAL LOW (ref 6.5–8.1)

## 2016-05-28 LAB — MAGNESIUM: Magnesium: 2 mg/dL (ref 1.7–2.4)

## 2016-05-28 MED ORDER — SODIUM CHLORIDE 0.9 % IV SOLN
60.0000 mg/m2 | Freq: Once | INTRAVENOUS | Status: AC
  Administered 2016-05-28: 159 mg via INTRAVENOUS
  Filled 2016-05-28: qty 159

## 2016-05-28 MED ORDER — HEPARIN SOD (PORK) LOCK FLUSH 100 UNIT/ML IV SOLN
500.0000 [IU] | Freq: Once | INTRAVENOUS | Status: DC | PRN
  Filled 2016-05-28: qty 5

## 2016-05-28 MED ORDER — SODIUM CHLORIDE 0.9 % IV SOLN
120.0000 mg/m2 | Freq: Once | INTRAVENOUS | Status: AC
  Administered 2016-05-28: 320 mg via INTRAVENOUS
  Filled 2016-05-28: qty 16

## 2016-05-28 MED ORDER — PALONOSETRON HCL INJECTION 0.25 MG/5ML
0.2500 mg | Freq: Once | INTRAVENOUS | Status: AC
  Administered 2016-05-28: 0.25 mg via INTRAVENOUS
  Filled 2016-05-28: qty 5

## 2016-05-28 MED ORDER — FOSAPREPITANT DIMEGLUMINE INJECTION 150 MG
Freq: Once | INTRAVENOUS | Status: AC
  Administered 2016-05-28: 13:00:00 via INTRAVENOUS
  Filled 2016-05-28: qty 5

## 2016-05-28 MED ORDER — POTASSIUM CHLORIDE 2 MEQ/ML IV SOLN
Freq: Once | INTRAVENOUS | Status: AC
  Administered 2016-05-28: 11:00:00 via INTRAVENOUS
  Filled 2016-05-28: qty 10

## 2016-05-28 NOTE — Progress Notes (Signed)
Tolerated infusions w/o adverse reaction.  Alert, in no distress.  VSS.  Discharged via wheelchair.

## 2016-05-28 NOTE — Patient Instructions (Signed)
[facility] Discharge Instructions for Patients Receiving Chemotherapy   Beginning January 23rd 2017 lab work for the [facility] will be done in the  Main lab at [facility] on 1st floor. If you have a lab appointment with the [place] please come in thru the  Main Entrance and check in at the main information desk   Today you received the following chemotherapy agents:  Cisplatin and etoposide  If you develop nausea and vomiting, or diarrhea that is not controlled by your medication, call the clinic.  The clinic phone number is [phone]. Office hours are Monday-Friday 8:30am-5:00pm.  BELOW ARE SYMPTOMS THAT SHOULD BE REPORTED IMMEDIATELY:  *FEVER GREATER THAN 101.0 F  *CHILLS WITH OR WITHOUT FEVER  NAUSEA AND VOMITING THAT IS NOT CONTROLLED WITH YOUR NAUSEA MEDICATION  *UNUSUAL SHORTNESS OF BREATH  *UNUSUAL BRUISING OR BLEEDING  TENDERNESS IN MOUTH AND THROAT WITH OR WITHOUT PRESENCE OF ULCERS  *URINARY PROBLEMS  *BOWEL PROBLEMS  UNUSUAL RASH Items with * indicate a potential emergency and should be followed up as soon as possible. If you have an emergency after office hours please contact your primary care physician or go to the nearest emergency department.  Please call the clinic during office hours if you have any questions or concerns.   You may also contact the Patient Navigator at [phone] should you have any questions or need assistance in obtaining follow up care.      Resources For Cancer Patients and their Caregivers ? American Cancer Society: Can assist with transportation, wigs, general needs, runs Look Good Feel Better.        260-352-6817 ? Cancer Care: Provides financial assistance, online support groups, medication/co-pay assistance.  1-800-813-HOPE (786) 662-0310) ? [place] Assists [place] cancer patients and their families through emotional , educational and financial support.   (779) [phone] ? [place] DSS Where to apply for food stamps, Medicaid and utility assistance. (267)199-4931 ? RCATS: Transportation to medical appointments. 6817900893 ? Social Security Administration: May apply for disability if have a Stage IV cancer. [phone] [phone] ? [employer], Disability and Transit Services: Assists with nutrition, care and transit needs. [phone]

## 2016-05-29 ENCOUNTER — Encounter (HOSPITAL_BASED_OUTPATIENT_CLINIC_OR_DEPARTMENT_OTHER): Payer: Medicare Other

## 2016-05-29 ENCOUNTER — Encounter (HOSPITAL_COMMUNITY): Payer: Self-pay

## 2016-05-29 ENCOUNTER — Encounter (HOSPITAL_BASED_OUTPATIENT_CLINIC_OR_DEPARTMENT_OTHER): Payer: Medicare Other | Admitting: Oncology

## 2016-05-29 VITALS — BP 125/69 | HR 83 | Temp 97.8°F | Resp 18

## 2016-05-29 VITALS — BP 119/63 | HR 70 | Temp 98.8°F | Resp 16 | Wt 294.0 lb

## 2016-05-29 DIAGNOSIS — J441 Chronic obstructive pulmonary disease with (acute) exacerbation: Secondary | ICD-10-CM | POA: Diagnosis not present

## 2016-05-29 DIAGNOSIS — C3411 Malignant neoplasm of upper lobe, right bronchus or lung: Secondary | ICD-10-CM

## 2016-05-29 DIAGNOSIS — Z5111 Encounter for antineoplastic chemotherapy: Secondary | ICD-10-CM | POA: Diagnosis not present

## 2016-05-29 DIAGNOSIS — E119 Type 2 diabetes mellitus without complications: Secondary | ICD-10-CM | POA: Diagnosis not present

## 2016-05-29 DIAGNOSIS — C3491 Malignant neoplasm of unspecified part of right bronchus or lung: Secondary | ICD-10-CM | POA: Diagnosis not present

## 2016-05-29 DIAGNOSIS — Z9981 Dependence on supplemental oxygen: Secondary | ICD-10-CM | POA: Diagnosis not present

## 2016-05-29 DIAGNOSIS — Z510 Encounter for antineoplastic radiation therapy: Secondary | ICD-10-CM | POA: Diagnosis not present

## 2016-05-29 DIAGNOSIS — I5040 Unspecified combined systolic (congestive) and diastolic (congestive) heart failure: Secondary | ICD-10-CM | POA: Diagnosis not present

## 2016-05-29 DIAGNOSIS — I110 Hypertensive heart disease with heart failure: Secondary | ICD-10-CM | POA: Diagnosis not present

## 2016-05-29 DIAGNOSIS — Z794 Long term (current) use of insulin: Secondary | ICD-10-CM | POA: Diagnosis not present

## 2016-05-29 DIAGNOSIS — C3490 Malignant neoplasm of unspecified part of unspecified bronchus or lung: Secondary | ICD-10-CM

## 2016-05-29 DIAGNOSIS — I482 Chronic atrial fibrillation: Secondary | ICD-10-CM | POA: Diagnosis not present

## 2016-05-29 MED ORDER — SODIUM CHLORIDE 0.9% FLUSH
10.0000 mL | INTRAVENOUS | Status: DC | PRN
  Administered 2016-05-29: 10 mL
  Filled 2016-05-29: qty 10

## 2016-05-29 MED ORDER — SODIUM CHLORIDE 0.9 % IV SOLN: 10.0000 mg | Freq: Once | INTRAVENOUS | Status: DC

## 2016-05-29 MED ORDER — SODIUM CHLORIDE 0.9 % IV SOLN
120.0000 mg/m2 | Freq: Once | INTRAVENOUS | Status: AC
  Administered 2016-05-29: 320 mg via INTRAVENOUS
  Filled 2016-05-29: qty 16

## 2016-05-29 MED ORDER — HEPARIN SOD (PORK) LOCK FLUSH 100 UNIT/ML IV SOLN
500.0000 [IU] | Freq: Once | INTRAVENOUS | Status: AC | PRN
  Administered 2016-05-29: 500 [IU]
  Filled 2016-05-29: qty 5

## 2016-05-29 MED ORDER — SODIUM CHLORIDE 0.9 % IV SOLN
Freq: Once | INTRAVENOUS | Status: AC
  Administered 2016-05-29: 11:00:00 via INTRAVENOUS

## 2016-05-29 MED ORDER — DEXAMETHASONE SODIUM PHOSPHATE 10 MG/ML IJ SOLN
10.0000 mg | Freq: Once | INTRAMUSCULAR | Status: AC
  Administered 2016-05-29: 10 mg via INTRAVENOUS
  Filled 2016-05-29: qty 1

## 2016-05-29 NOTE — Patient Instructions (Signed)
[facility] Discharge Instructions for Patients Receiving Chemotherapy   Beginning January 23rd 2017 lab work for the [facility] will be done in the  Main lab at [facility] on 1st floor. If you have a lab appointment with the [place] please come in thru the  Main Entrance and check in at the main information desk   Today you received the following chemotherapy agents VP-16. Follow-up as scheduled. Call clinic for any questions or concerns  To help prevent nausea and vomiting after your treatment, we encourage you to take your nausea medication   If you develop nausea and vomiting, or diarrhea that is not controlled by your medication, call the clinic.  The clinic phone number is [phone]. Office hours are Monday-Friday 8:30am-5:00pm.  BELOW ARE SYMPTOMS THAT SHOULD BE REPORTED IMMEDIATELY:  *FEVER GREATER THAN 101.0 F  *CHILLS WITH OR WITHOUT FEVER  NAUSEA AND VOMITING THAT IS NOT CONTROLLED WITH YOUR NAUSEA MEDICATION  *UNUSUAL SHORTNESS OF BREATH  *UNUSUAL BRUISING OR BLEEDING  TENDERNESS IN MOUTH AND THROAT WITH OR WITHOUT PRESENCE OF ULCERS  *URINARY PROBLEMS  *BOWEL PROBLEMS  UNUSUAL RASH Items with * indicate a potential emergency and should be followed up as soon as possible. If you have an emergency after office hours please contact your primary care physician or go to the nearest emergency department.  Please call the clinic during office hours if you have any questions or concerns.   You may also contact the Patient Navigator at [phone] should you have any questions or need assistance in obtaining follow up care.      Resources For Cancer Patients and their Caregivers ? American Cancer Society: Can assist with transportation, wigs, general needs, runs Look Good Feel Better.        954-329-1305 ? Cancer Care: Provides financial assistance, online support groups, medication/co-pay assistance.  1-800-813-HOPE  6301042618) ? [place] Assists [place] cancer patients and their families through emotional , educational and financial support.  ([phone] ? [place] DSS Where to apply for food stamps, Medicaid and utility assistance. 206-484-3912 ? [employer]ATS: Transportation to medical appointments. ([phone] ? Social Security Administration: May apply for disability if have a Stage IV cancer. [phone] ? [employer], Disability and Transit Services: Assists with nutrition, care and transit needs. [phone]

## 2016-05-29 NOTE — Progress Notes (Signed)
Chase Herrera tolerated chemo tx well without complaints or incident. VSS upon discharge. Port left accessed,saline locked and flushed with 10 ml NS and 5 ml Heparin easily per protocol for use tomorrow. Pt discharged via wheelchair in satisfactory condition

## 2016-05-29 NOTE — Progress Notes (Signed)
[place]  Follow Up Progress Note  Patient Care Team: Neale Burly, MD as PCP - General (Internal Medicine)  CHIEF COMPLAINTS/PURPOSE OF CONSULTATION:  Small cell carcinoma of RUL  HISTORY OF PRESENTING ILLNESS:     Small cell lung cancer ([place])   04/03/2016 Initial Diagnosis    Small cell lung cancer ([place])     04/09/2016 Imaging    MRI brain- Negative for metastatic disease to the brain. No acute abnormality.      04/09/2016 Procedure    Right IJ port catheter placement by IR      04/13/2016 PET scan    . The right apical lung mass has significantly enlarged and there is new right paratracheal adenopathy as well as a hypermetabolic new lymph node in the right inner infraclavicular fossa potentially with early impingement on adjacent neurovascular structures. 2. Other imaging findings of potential clinical significance: Chronic bilateral maxillary sinusitis. Left carotid atherosclerotic calcification. Aortoiliac atherosclerotic vascular disease. Coronary atherosclerosis. Paraseptal emphysema. Mild atelectasis in the lung bases. Scattered sigmoid colon diverticula. Probable right pars defect at L5.       Chase Herrera 66 y.o. male previous smoker with a history of severe COPD on 2L O2, PAF (on Xarelto), and DM who is here because of a RUL mass. The patient was evaluated by Dr. Lianne Moris in CT surgery, who noticed a RUL mass. A CT of the chest was done and showed a 4 cm hypermetabolic (SUV 91.6) RUL mass that was invading rib #1 and rib #2 posterolaterally on PET. On 03/22/16 patient underwent a bronchoscopy with right VATS and biopsy of RUL mass. Surgical path from biopsy showed high grade neuroendocrine carcinoma, most consistent with small cell carcinoma.   The patient presents today for  follow up to assess tolerability to chemotherapy. He presents in a wheelchair. He is scheduled for cycle 3 day 2 of cisplatin / etoposide.  Patient states that he feels weak  but is still performing all of his own ADLs including driving.  He notes that his sob has been improving. His appetite is good. He states he endorsed  some dysphagia. His PCP prescribed him medicine and he notes it has since resolved.    MEDICAL HISTORY:  Past Medical History:  Diagnosis Date  . Alcohol abuse    Quit 08/23/11  . Arthritis   . Atrial fibrillation ([place])   . Atrial flutter [facility])    s/p CTI ablation by Dr [name] 10/13  . Cancer ([place])    lung cancer  . Cardiomyopathy ([place])    LVEF 25-30% 8/13, previously normal by dobutamine echo 5/13  . Chest pain   . CHF (congestive heart failure) ([place])   . COPD (chronic obstructive pulmonary disease) ([place])   . Depression   . Dyspnea   . Essential hypertension, benign   . Obesity   . Prostate enlargement   . Type 2 diabetes mellitus ([place])     SURGICAL HISTORY: Past Surgical History:  Procedure Laterality Date  . APPENDECTOMY    . ATRIAL ABLATION SURGERY  11/06/2011   CTI ablation for atrial flutter by Dr [name]  . ATRIAL FLUTTER ABLATION N/A 11/06/2011   Procedure: ATRIAL FLUTTER ABLATION;  Surgeon: Thompson Grayer, MD;  Location: [facility] CATH LAB;  Service: Cardiovascular;  Laterality: N/A;  . Cataracts    . IR GENERIC HISTORICAL  04/09/2016   IR US GUIDE VASC ACCESS RIGHT 04/09/2016 [name], DO [facility]-INTERV RAD  . IR GENERIC HISTORICAL  04/09/2016  IR FLUORO GUIDE PORT INSERTION RIGHT 04/09/2016 [name], DO [facility]  . TONSILLECTOMY      SOCIAL HISTORY: Social History   Social History  . Marital status: Single    Spouse name: N/A  . Number of children: N/A  . Years of education: N/A   Occupational History  . retired      Building services engineer   Social History Main Topics  . Smoking status: Former Smoker    Packs/day: 0.50    Years: 47.00    Types: Cigarettes  . Smokeless tobacco: Former Systems developer    Quit date: 04/10/2014     Comment: encouraged to quit today 03/04/12  . Alcohol use Yes     Comment: quit 2 years ago    . Drug use: No     Comment: marajuana occasionally  . Sexual activity: Not Currently   Other Topics Concern  . Not on file   Social History Narrative   Lives in [place] alone.   Disabled due to COPD   Previousliy worked as a Electrical engineer    FAMILY HISTORY: Family History  Problem Relation Age of Onset  . Diabetes Sister   . Hypertension Mother   . Cancer Mother     lung  . Hypertension Father   . Heart attack Father   . Hypertension Sister   . Hypertension Sister   . Hypertension Sister   . Pulmonary fibrosis Sister     ALLERGIES:  is allergic to codeine.  MEDICATIONS:  Current Outpatient Prescriptions  Medication Sig Dispense Refill  . albuterol (PROVENTIL HFA;VENTOLIN HFA) 108 (90 BASE) MCG/ACT inhaler Inhale 2 puffs into the lungs every 6 (six) hours as needed. For shortness of breath    . amiodarone (PACERONE) 100 MG tablet Take 100 mg by mouth 2 (two) times daily.    Marland Kitchen aspirin EC 81 MG tablet Take 81 mg by mouth daily.    . carvedilol (COREG) 25 MG tablet Take 25 mg by mouth 2 (two) times daily with a meal.    . CISPLATIN IV Inject into the vein every 21 ( twenty-one) days.    Marland Kitchen dexamethasone (DECADRON) 4 MG tablet Take 2 tablets two times a day for 1 day on day 4 after cisplatin chemotherapy. Take with food. 30 tablet 1  . diltiazem (CARDIZEM CD) 240 MG 24 hr capsule Take 240 mg by mouth.    . ETOPOSIDE IV Inject into the vein every 21 ( twenty-one) days. Days 1-3 every 21 days    . furosemide (LASIX) 40 MG tablet Take 40 mg by mouth daily.    Marland Kitchen gabapentin (NEURONTIN) 300 MG capsule Take by mouth.    . insulin glargine (LANTUS) 100 UNIT/ML injection Inject 25 Units into the skin 2 (two) times daily.    . insulin lispro (HUMALOG) 100 UNIT/ML injection Inject into the skin. SSI    . ipratropium-albuterol (DUONEB) 0.5-2.5 (3) MG/3ML SOLN Take 3 mLs by nebulization every 6 (six) hours as needed (shortness of breath/wheezing).    Marland Kitchen lidocaine-prilocaine  (EMLA) cream Apply to affected area once 30 g 3  . LORazepam (ATIVAN) 0.5 MG tablet Take 1 tablet (0.5 mg total) by mouth every 6 (six) hours as needed (Nausea or vomiting). 30 tablet 0  . metformin (FORTAMET) 500 MG (OSM) 24 hr tablet Take 1,000 mg by mouth.    . Multiple Vitamin (MULTIVITAMIN) capsule Take by mouth.    . ondansetron (ZOFRAN) 8 MG tablet Take 1 tablet (8 mg  total) by mouth 2 (two) times daily as needed. Start on the third day after cisplatin chemotherapy. (Patient taking differently: Take 8 mg by mouth 2 (two) times daily as needed. Start on the third day after cisplatin chemotherapy. # 1 med to take for nausea) 30 tablet 1  . oxyCODONE-acetaminophen (PERCOCET/ROXICET) 5-325 MG tablet Take 1-2 tablets by mouth every 4 (four) hours as needed for severe pain. 90 tablet 0  . Pegfilgrastim (NEULASTA ONPRO [place]) Inject into the skin every 21 ( twenty-one) days.    . prochlorperazine (COMPAZINE) 10 MG tablet Take 1 tablet (10 mg total) by mouth every 6 (six) hours as needed (Nausea or vomiting). (Patient taking differently: Take 10 mg by mouth every 6 (six) hours as needed (Nausea or vomiting). #2 to take for nausea) 30 tablet 1  . Rivaroxaban (XARELTO) 15 MG TABS tablet Take 15 mg by mouth.    . tamsulosin (FLOMAX) 0.4 MG CAPS capsule Take by mouth.     No current facility-administered medications for this visit.    Facility-Administered Medications Ordered in Other Visits  Medication Dose Route Frequency Provider Last Rate Last Dose  . 0.9 %  sodium chloride infusion   Intravenous Once Twana First, MD      . dexamethasone (DECADRON) injection 10 mg  10 mg Intravenous Once Twana First, MD      . etoposide (VEPESID) 320 mg in sodium chloride 0.9 % 1,000 mL chemo infusion  120 mg/m2 (Treatment Plan Recorded) Intravenous Once Twana First, MD      . heparin lock flush 100 unit/mL  500 Units Intracatheter Once PRN Twana First, MD      . sodium chloride flush (NS) 0.9 % injection 10 mL   10 mL Intracatheter PRN Twana First, MD       Review of Systems  Constitutional: Positive for malaise/fatigue.       Appetite is good  HENT: Negative.   Eyes: Negative.   Respiratory: Positive for shortness of breath (exertion; it is improving).        COPD  Gastrointestinal: Negative.   Genitourinary: Negative.   Musculoskeletal: Negative.   Skin: Negative.   Neurological: Positive for weakness.  Endo/Heme/Allergies: Negative.   Psychiatric/Behavioral: Negative.   All other systems reviewed and are negative. 14 point ROS was done and is otherwise as detailed above or in HPI  PHYSICAL EXAMINATION: ECOG PERFORMANCE STATUS: 2 - Symptomatic, <50% confined to bed  Vitals:   05/29/16 1015  BP: 119/63  Pulse: 70  Resp: 16  Temp: 98.8 F (37.1 C)   Filed Weights   05/29/16 1015  Weight: 294 lb (133.4 kg)      Physical Exam  Constitutional: He is oriented to person, place, and time and well-developed, well-nourished, and in no distress.  HENT:  Head: Normocephalic and atraumatic.  Mouth/Throat: Oropharynx is clear and moist.  Eyes: Conjunctivae and EOM are normal. Pupils are equal, round, and reactive to light.  Neck: Normal range of motion. Neck supple.  Cardiovascular: Normal rate, regular rhythm and normal heart sounds.   Pulmonary/Chest: Effort normal and breath sounds normal. No respiratory distress.  Abdominal: Soft. Bowel sounds are normal.  Musculoskeletal: Normal range of motion.  Neurological: He is alert and oriented to person, place, and time. Gait normal.  Skin: Skin is warm and dry.  Nursing note and vitals reviewed.  LABORATORY DATA:  I have reviewed the data as listed. Lab Results  Component Value Date   WBC 9.1 05/28/2016   HGB  11.2 (L) 05/28/2016   HCT 34.3 (L) 05/28/2016   MCV 100.0 05/28/2016   PLT 283 05/28/2016   CMP     Component Value Date/Time   NA 136 05/28/2016 1029   K 4.2 05/28/2016 1029   CL 96 (L) 05/28/2016 1029   CO2 31  05/28/2016 1029   GLUCOSE 193 (H) 05/28/2016 1029   BUN 23 (H) 05/28/2016 1029   CREATININE 1.17 05/28/2016 1029   CALCIUM 8.8 (L) 05/28/2016 1029   PROT 5.9 (L) 05/28/2016 1029   ALBUMIN 3.5 05/28/2016 1029   AST 26 05/28/2016 1029   ALT 32 05/28/2016 1029   ALKPHOS 53 05/28/2016 1029   BILITOT 0.4 05/28/2016 1029   GFRNONAA >60 05/28/2016 1029   GFRAA >60 05/28/2016 1029   PATHOLOGY:  Outside report 03/22/2016 Diagnosis: "RIGHT UPPER LOBE MASS"; BIOPSY: High grade neuroendocrine carcinoma, most consistent with small cell carcinoma.  Note: the tumor cells are positive for CK7, TTF-1 (diffuse and strong), synaptophysin (diffuse and strong), chromogranin (focal and weak) while negative for Napsin A, CK20, CDX2 and p63 supporting the diagnosis.  A Ki67 stain shows a proliferative rate of 40%  RADIOGRAPHIC STUDIES: I have personally reviewed the radiological images as listed and agreed with the findings in the report.  PET scan 12/22/2015 IMPRESSION: 1. Hypermetabolic RIGHT upper lobe mass abutting pleural surface of the RIGHT lung apex consistent with primary bronchogenic carcinoma. 2. No evidence of metastatic disease.  T2a N0 M0 . 3. Paraseptal emphysema.  Outside Imaging 12/09/2015 Chest CT IMPRESSION: 4 cm spiculated right apical mass, adjacent to 2nd rib. No mediastinal adenopathy.   MRI HEAD WITHOUT AND WITH CONTRAST 04/09/2016  IMPRESSION: Negative for metastatic disease to the brain.  No acute abnormality.  NUCLEAR MEDICINE PET SKULL BASE TO THIGH 04/13/2016   IMPRESSION: 1. The right apical lung mass has significantly enlarged and there is new right paratracheal adenopathy as well as a hypermetabolic new lymph node in the right inner infraclavicular fossa potentially with early impingement on adjacent neurovascular structures. 2. Other imaging findings of potential clinical significance: Chronic bilateral maxillary sinusitis. Left carotid  atherosclerotic calcification. Aortoiliac atherosclerotic vascular disease. Coronary atherosclerosis. Paraseptal emphysema. Mild atelectasis in the lung bases. Scattered sigmoid colon diverticula. Probable right pars defect at L5.  ASSESSMENT & PLAN:  Small cell carcinoma of RUL  Cancer Staging Small cell lung cancer ([place]) Staging form: Lung, AJCC 8th Edition - Clinical: Stage IIIC (cT3, cN3, cM0) - Signed by Twana First, MD on 04/16/2016    PLAN: Currently getting treatment with concurrent chemoRT.  Continue chemotherapy treatment with cisplatin and etoposide as planned for a total of 4 cycles  Advised him that his dysphagia is likely related to side effects of RT.  Repeat scans 4-6 weeks after he is finishes with radiation and chemotherapy.   RTC on May 29 for cycle 4 of chemotherapy and for f/u.   All questions were answered. The patient knows to call the clinic with any problems, questions or concerns.  This document serves as a record of services personally performed by Twana First, MD. It was created on her behalf by Shirlean Mylar, a trained medical scribe. The creation of this record is based on the scribe's personal observations and the provider's statements to them. This document has been checked and approved by the attending provider.  I have reviewed the above documentation for accuracy and completeness and I agree with the above.  This note was electronically signed.    Twana First, MD 05/29/2016  10:35 AM

## 2016-05-29 NOTE — Patient Instructions (Addendum)
[place] at [facility] Discharge Instructions  RECOMMENDATIONS MADE BY THE CONSULTANT AND ANY TEST RESULTS WILL BE SENT TO YOUR REFERRING PHYSICIAN.  You were seen today by Dr. Twana First You will get day 3 of chemotherapy tomorrow Follow up in 3 weeks   Thank you for choosing [place] at [facility] to provide your oncology and hematology care.  To afford each patient quality time with our provider, please arrive at least 15 minutes before your scheduled appointment time.    If you have a lab appointment with the [facility] please come in thru the  Main Entrance and check in at the main information desk  You need to re-schedule your appointment should you arrive 10 or more minutes late.  We strive to give you quality time with our providers, and arriving late affects you and other patients whose appointments are after yours.  Also, if you no show three or more times for appointments you may be dismissed from the clinic at the providers discretion.     Again, thank you for choosing [facility].  Our hope is that these requests will decrease the amount of time that you wait before being seen by our physicians.       _____________________________________________________________  Should you have questions after your visit to [facility], please contact our office at [phone] between the hours of 8:30 a.m. and 4:30 p.m.  Voicemails left after 4:30 p.m. will not be returned until the following business day.  For prescription refill requests, have your pharmacy contact our office.       Resources For Cancer Patients and their Caregivers ? American Cancer Society: Can assist with transportation, wigs, general needs, runs Look Good Feel Better.        902 439 2192 ? Cancer Care: Provides financial assistance, online support groups, medication/co-pay assistance.  1-800-813-HOPE (859)760-5142) ? [place] Assists [name] cancer patients and their families through emotional , educational and financial support.  (972)552-2951 ? [place] DSS Where to apply for food stamps, Medicaid and utility assistance. 915-762-8239 ? RCATS: Transportation to medical appointments. 8014661791 ? Social Security Administration: May apply for disability if have a Stage IV cancer. (423) 640-6690 (772) 372-0883 ? [employer], Disability and Transit Services: Assists with nutrition, care and transit needs. [place] Support Programs: '@10RELATIVEDAYS'$ @ > Cancer Support Group  2nd Tuesday of the month 1pm-2pm, Journey Room  > Creative Journey  3rd Tuesday of the month 1130am-1pm, Journey Room  > Look Good Feel Better  1st Wednesday of the month 10am-12 noon, Journey Room (Call Marty to register [phone])

## 2016-05-30 ENCOUNTER — Other Ambulatory Visit: Payer: Self-pay

## 2016-05-30 ENCOUNTER — Encounter (HOSPITAL_BASED_OUTPATIENT_CLINIC_OR_DEPARTMENT_OTHER): Payer: Medicare Other

## 2016-05-30 VITALS — BP 130/59 | HR 65 | Temp 97.8°F | Resp 20 | Wt 294.8 lb

## 2016-05-30 DIAGNOSIS — Z5111 Encounter for antineoplastic chemotherapy: Secondary | ICD-10-CM | POA: Diagnosis not present

## 2016-05-30 DIAGNOSIS — C3411 Malignant neoplasm of upper lobe, right bronchus or lung: Secondary | ICD-10-CM | POA: Diagnosis not present

## 2016-05-30 DIAGNOSIS — I480 Paroxysmal atrial fibrillation: Secondary | ICD-10-CM | POA: Diagnosis not present

## 2016-05-30 DIAGNOSIS — Z9889 Other specified postprocedural states: Secondary | ICD-10-CM | POA: Diagnosis not present

## 2016-05-30 DIAGNOSIS — E119 Type 2 diabetes mellitus without complications: Secondary | ICD-10-CM | POA: Diagnosis not present

## 2016-05-30 DIAGNOSIS — C3491 Malignant neoplasm of unspecified part of right bronchus or lung: Secondary | ICD-10-CM | POA: Diagnosis not present

## 2016-05-30 DIAGNOSIS — Z87891 Personal history of nicotine dependence: Secondary | ICD-10-CM | POA: Diagnosis not present

## 2016-05-30 DIAGNOSIS — Z5189 Encounter for other specified aftercare: Secondary | ICD-10-CM | POA: Diagnosis not present

## 2016-05-30 DIAGNOSIS — I429 Cardiomyopathy, unspecified: Secondary | ICD-10-CM | POA: Diagnosis not present

## 2016-05-30 DIAGNOSIS — J449 Chronic obstructive pulmonary disease, unspecified: Secondary | ICD-10-CM | POA: Diagnosis not present

## 2016-05-30 DIAGNOSIS — I4892 Unspecified atrial flutter: Secondary | ICD-10-CM | POA: Diagnosis not present

## 2016-05-30 DIAGNOSIS — Z7982 Long term (current) use of aspirin: Secondary | ICD-10-CM | POA: Diagnosis not present

## 2016-05-30 DIAGNOSIS — Z794 Long term (current) use of insulin: Secondary | ICD-10-CM | POA: Diagnosis not present

## 2016-05-30 DIAGNOSIS — Z510 Encounter for antineoplastic radiation therapy: Secondary | ICD-10-CM | POA: Diagnosis not present

## 2016-05-30 MED ORDER — SODIUM CHLORIDE 0.9 % IV SOLN
Freq: Once | INTRAVENOUS | Status: AC
  Administered 2016-05-30: 10:00:00 via INTRAVENOUS

## 2016-05-30 MED ORDER — SODIUM CHLORIDE 0.9 % IV SOLN: 10.0000 mg | Freq: Once | INTRAVENOUS | Status: DC

## 2016-05-30 MED ORDER — DEXAMETHASONE SODIUM PHOSPHATE 10 MG/ML IJ SOLN
10.0000 mg | Freq: Once | INTRAMUSCULAR | Status: AC
  Administered 2016-05-30: 10 mg via INTRAVENOUS
  Filled 2016-05-30: qty 1

## 2016-05-30 MED ORDER — HEPARIN SOD (PORK) LOCK FLUSH 100 UNIT/ML IV SOLN
500.0000 [IU] | Freq: Once | INTRAVENOUS | Status: AC | PRN
Start: 2016-05-30 — End: 2016-05-30
  Administered 2016-05-30: 500 [IU]
  Filled 2016-05-30: qty 5

## 2016-05-30 MED ORDER — SODIUM CHLORIDE 0.9% FLUSH
10.0000 mL | INTRAVENOUS | Status: DC | PRN
  Administered 2016-05-30: 10 mL
  Filled 2016-05-30: qty 10

## 2016-05-30 MED ORDER — SODIUM CHLORIDE 0.9 % IV SOLN
120.0000 mg/m2 | Freq: Once | INTRAVENOUS | Status: AC
  Administered 2016-05-30: 320 mg via INTRAVENOUS
  Filled 2016-05-30: qty 16

## 2016-05-30 MED ORDER — PEGFILGRASTIM 6 MG/0.6ML ~~LOC~~ PSKT
6.0000 mg | PREFILLED_SYRINGE | Freq: Once | SUBCUTANEOUS | Status: AC
  Administered 2016-05-30: 6 mg via SUBCUTANEOUS
  Filled 2016-05-30: qty 0.6

## 2016-05-30 NOTE — Progress Notes (Signed)
1158-patient complaining of chest tightness, chemotherapy stopped, new line of Normal saline started. Vitals stable, will get EKG, Kirby Crigler PA-C notified. Patient rates his chest tightness at a 3, see flowsheet.

## 2016-05-30 NOTE — Patient Instructions (Signed)
[facility] Discharge Instructions for Patients Receiving Chemotherapy   Beginning January 23rd 2017 lab work for the [facility] will be done in the  Main lab at [facility] on 1st floor. If you have a lab appointment with the [facility] please come in thru the  Main Entrance and check in at the main information desk   Today you received the following chemotherapy agents etoposide day 3. Neulasta device will dispense medication between 145 pm and 245 pm tomorrow Thursday May 10. You may take device off after 245 pm tomorrow.  To help prevent nausea and vomiting after your treatment, we encourage you to take your nausea medication as instructed.   If you develop nausea and vomiting, or diarrhea that is not controlled by your medication, call the clinic.  The clinic phone number is [phone]. Office hours are Monday-Friday 8:30am-5:00pm.  BELOW ARE SYMPTOMS THAT SHOULD BE REPORTED IMMEDIATELY:  *FEVER GREATER THAN 101.0 F  *CHILLS WITH OR WITHOUT FEVER  NAUSEA AND VOMITING THAT IS NOT CONTROLLED WITH YOUR NAUSEA MEDICATION  *UNUSUAL SHORTNESS OF BREATH  *UNUSUAL BRUISING OR BLEEDING  TENDERNESS IN MOUTH AND THROAT WITH OR WITHOUT PRESENCE OF ULCERS  *URINARY PROBLEMS  *BOWEL PROBLEMS  UNUSUAL RASH Items with * indicate a potential emergency and should be followed up as soon as possible. If you have an emergency after office hours please contact your primary care physician or go to the nearest emergency department.  Please call the clinic during office hours if you have any questions or concerns.   You may also contact the Patient Navigator at [phone] should you have any questions or need assistance in obtaining follow up care.      Resources For Cancer Patients and their Caregivers ? American Cancer Society: Can assist with transportation, wigs, general needs, runs Look Good Feel Better.        818-650-0453 ? Cancer Care: Provides  financial assistance, online support groups, medication/co-pay assistance.  1-800-813-HOPE (289) 084-7870) ? [place] Assists [place] cancer patients and their families through emotional , educational and financial support.  431-211-4840 ? [place] DSS Where to apply for food stamps, Medicaid and utility assistance. 2021941592 ? RCATS: Transportation to medical appointments. 708 544 9321 ? Social Security Administration: May apply for disability if have a Stage IV cancer. (620) 726-3299 260 132 7916 ? [employer], Disability and Transit Services: Assists with nutrition, care and transit needs. (204)359-3161

## 2016-05-30 NOTE — Progress Notes (Signed)
1215 EKG reviewed by T.Kefalas,PA. VSS. Patient denies complaints of pain/chest tightness. Per PA OK to resume/finish chemo treatment. Chase KitchenBaker Herrera arrived today for [facility] neulasta on body injector. See MAR for administration details. Injector in place and engaged with green light indicator on flashing. Tolerated application with out problems. Tolerated chemo well. Stable on discharge home to self via wheelchair.

## 2016-05-31 DIAGNOSIS — I509 Heart failure, unspecified: Secondary | ICD-10-CM | POA: Diagnosis not present

## 2016-05-31 DIAGNOSIS — Z510 Encounter for antineoplastic radiation therapy: Secondary | ICD-10-CM | POA: Diagnosis not present

## 2016-05-31 DIAGNOSIS — C3411 Malignant neoplasm of upper lobe, right bronchus or lung: Secondary | ICD-10-CM | POA: Diagnosis not present

## 2016-06-01 ENCOUNTER — Telehealth (HOSPITAL_COMMUNITY): Payer: Self-pay | Admitting: *Deleted

## 2016-06-01 ENCOUNTER — Encounter (HOSPITAL_COMMUNITY): Payer: Self-pay | Admitting: *Deleted

## 2016-06-01 DIAGNOSIS — C3411 Malignant neoplasm of upper lobe, right bronchus or lung: Secondary | ICD-10-CM | POA: Diagnosis not present

## 2016-06-01 DIAGNOSIS — I110 Hypertensive heart disease with heart failure: Secondary | ICD-10-CM | POA: Diagnosis not present

## 2016-06-01 DIAGNOSIS — C3491 Malignant neoplasm of unspecified part of right bronchus or lung: Secondary | ICD-10-CM | POA: Diagnosis not present

## 2016-06-01 DIAGNOSIS — I482 Chronic atrial fibrillation: Secondary | ICD-10-CM | POA: Diagnosis not present

## 2016-06-01 DIAGNOSIS — Z794 Long term (current) use of insulin: Secondary | ICD-10-CM | POA: Diagnosis not present

## 2016-06-01 DIAGNOSIS — I5040 Unspecified combined systolic (congestive) and diastolic (congestive) heart failure: Secondary | ICD-10-CM | POA: Diagnosis not present

## 2016-06-01 DIAGNOSIS — Z510 Encounter for antineoplastic radiation therapy: Secondary | ICD-10-CM | POA: Diagnosis not present

## 2016-06-01 DIAGNOSIS — J441 Chronic obstructive pulmonary disease with (acute) exacerbation: Secondary | ICD-10-CM | POA: Diagnosis not present

## 2016-06-01 DIAGNOSIS — Z9981 Dependence on supplemental oxygen: Secondary | ICD-10-CM | POA: Diagnosis not present

## 2016-06-01 DIAGNOSIS — E119 Type 2 diabetes mellitus without complications: Secondary | ICD-10-CM | POA: Diagnosis not present

## 2016-06-01 NOTE — Progress Notes (Signed)
Pt comes to clinic with OnPro device.  He is concerned that the medication was not delivered.  The Full/Empty indicator reads Empty.  Instructed pt that the medication must have been delivered. Verbalizes understanding.

## 2016-06-01 NOTE — Telephone Encounter (Signed)
Patient called to report problems with neulasta onpro device. He reports he waited 30-35 hours for device to dispense medication and he never heard any beeping and never felt medication go into his arm. He reports feeling the needle inject into his skin when device applied in clinic. He reports he took the device off at 8pm last night. He reports calling the drug company to report above. Instruct patient to bring device to clinic today. Verbalized understanding.

## 2016-06-04 DIAGNOSIS — C3491 Malignant neoplasm of unspecified part of right bronchus or lung: Secondary | ICD-10-CM | POA: Diagnosis not present

## 2016-06-04 DIAGNOSIS — E119 Type 2 diabetes mellitus without complications: Secondary | ICD-10-CM | POA: Diagnosis not present

## 2016-06-04 DIAGNOSIS — I482 Chronic atrial fibrillation: Secondary | ICD-10-CM | POA: Diagnosis not present

## 2016-06-04 DIAGNOSIS — J441 Chronic obstructive pulmonary disease with (acute) exacerbation: Secondary | ICD-10-CM | POA: Diagnosis not present

## 2016-06-04 DIAGNOSIS — Z9981 Dependence on supplemental oxygen: Secondary | ICD-10-CM | POA: Diagnosis not present

## 2016-06-04 DIAGNOSIS — Z794 Long term (current) use of insulin: Secondary | ICD-10-CM | POA: Diagnosis not present

## 2016-06-04 DIAGNOSIS — I5040 Unspecified combined systolic (congestive) and diastolic (congestive) heart failure: Secondary | ICD-10-CM | POA: Diagnosis not present

## 2016-06-04 DIAGNOSIS — I110 Hypertensive heart disease with heart failure: Secondary | ICD-10-CM | POA: Diagnosis not present

## 2016-06-05 DIAGNOSIS — Z794 Long term (current) use of insulin: Secondary | ICD-10-CM | POA: Diagnosis not present

## 2016-06-05 DIAGNOSIS — Z510 Encounter for antineoplastic radiation therapy: Secondary | ICD-10-CM | POA: Diagnosis not present

## 2016-06-05 DIAGNOSIS — J441 Chronic obstructive pulmonary disease with (acute) exacerbation: Secondary | ICD-10-CM | POA: Diagnosis not present

## 2016-06-05 DIAGNOSIS — E119 Type 2 diabetes mellitus without complications: Secondary | ICD-10-CM | POA: Diagnosis not present

## 2016-06-05 DIAGNOSIS — I5040 Unspecified combined systolic (congestive) and diastolic (congestive) heart failure: Secondary | ICD-10-CM | POA: Diagnosis not present

## 2016-06-05 DIAGNOSIS — I482 Chronic atrial fibrillation: Secondary | ICD-10-CM | POA: Diagnosis not present

## 2016-06-05 DIAGNOSIS — I110 Hypertensive heart disease with heart failure: Secondary | ICD-10-CM | POA: Diagnosis not present

## 2016-06-05 DIAGNOSIS — Z9981 Dependence on supplemental oxygen: Secondary | ICD-10-CM | POA: Diagnosis not present

## 2016-06-05 DIAGNOSIS — C3491 Malignant neoplasm of unspecified part of right bronchus or lung: Secondary | ICD-10-CM | POA: Diagnosis not present

## 2016-06-05 DIAGNOSIS — C3411 Malignant neoplasm of upper lobe, right bronchus or lung: Secondary | ICD-10-CM | POA: Diagnosis not present

## 2016-06-06 ENCOUNTER — Encounter (HOSPITAL_COMMUNITY): Payer: Self-pay | Admitting: *Deleted

## 2016-06-06 DIAGNOSIS — C3491 Malignant neoplasm of unspecified part of right bronchus or lung: Secondary | ICD-10-CM | POA: Diagnosis not present

## 2016-06-06 DIAGNOSIS — C3490 Malignant neoplasm of unspecified part of unspecified bronchus or lung: Secondary | ICD-10-CM

## 2016-06-06 DIAGNOSIS — C3411 Malignant neoplasm of upper lobe, right bronchus or lung: Secondary | ICD-10-CM | POA: Diagnosis not present

## 2016-06-06 DIAGNOSIS — E119 Type 2 diabetes mellitus without complications: Secondary | ICD-10-CM | POA: Diagnosis not present

## 2016-06-06 DIAGNOSIS — Z510 Encounter for antineoplastic radiation therapy: Secondary | ICD-10-CM | POA: Diagnosis not present

## 2016-06-06 DIAGNOSIS — J441 Chronic obstructive pulmonary disease with (acute) exacerbation: Secondary | ICD-10-CM | POA: Diagnosis not present

## 2016-06-06 DIAGNOSIS — I110 Hypertensive heart disease with heart failure: Secondary | ICD-10-CM | POA: Diagnosis not present

## 2016-06-06 DIAGNOSIS — I482 Chronic atrial fibrillation: Secondary | ICD-10-CM | POA: Diagnosis not present

## 2016-06-06 DIAGNOSIS — I5040 Unspecified combined systolic (congestive) and diastolic (congestive) heart failure: Secondary | ICD-10-CM | POA: Diagnosis not present

## 2016-06-06 DIAGNOSIS — Z9981 Dependence on supplemental oxygen: Secondary | ICD-10-CM | POA: Diagnosis not present

## 2016-06-06 DIAGNOSIS — Z794 Long term (current) use of insulin: Secondary | ICD-10-CM | POA: Diagnosis not present

## 2016-06-07 ENCOUNTER — Encounter (HOSPITAL_COMMUNITY): Payer: Medicare Other

## 2016-06-07 ENCOUNTER — Other Ambulatory Visit (HOSPITAL_COMMUNITY): Payer: Self-pay | Admitting: Adult Health

## 2016-06-07 ENCOUNTER — Other Ambulatory Visit (HOSPITAL_COMMUNITY): Payer: Self-pay | Admitting: Oncology

## 2016-06-07 ENCOUNTER — Encounter (HOSPITAL_COMMUNITY): Payer: Self-pay | Admitting: *Deleted

## 2016-06-07 DIAGNOSIS — I5040 Unspecified combined systolic (congestive) and diastolic (congestive) heart failure: Secondary | ICD-10-CM | POA: Diagnosis not present

## 2016-06-07 DIAGNOSIS — D61818 Other pancytopenia: Secondary | ICD-10-CM

## 2016-06-07 DIAGNOSIS — Z87891 Personal history of nicotine dependence: Secondary | ICD-10-CM | POA: Diagnosis not present

## 2016-06-07 DIAGNOSIS — I110 Hypertensive heart disease with heart failure: Secondary | ICD-10-CM | POA: Diagnosis not present

## 2016-06-07 DIAGNOSIS — C3491 Malignant neoplasm of unspecified part of right bronchus or lung: Secondary | ICD-10-CM

## 2016-06-07 DIAGNOSIS — Z510 Encounter for antineoplastic radiation therapy: Secondary | ICD-10-CM | POA: Diagnosis not present

## 2016-06-07 DIAGNOSIS — C3411 Malignant neoplasm of upper lobe, right bronchus or lung: Secondary | ICD-10-CM | POA: Diagnosis not present

## 2016-06-07 DIAGNOSIS — I429 Cardiomyopathy, unspecified: Secondary | ICD-10-CM | POA: Diagnosis not present

## 2016-06-07 DIAGNOSIS — Z794 Long term (current) use of insulin: Secondary | ICD-10-CM | POA: Diagnosis not present

## 2016-06-07 DIAGNOSIS — E119 Type 2 diabetes mellitus without complications: Secondary | ICD-10-CM | POA: Diagnosis not present

## 2016-06-07 DIAGNOSIS — Z9889 Other specified postprocedural states: Secondary | ICD-10-CM | POA: Diagnosis not present

## 2016-06-07 DIAGNOSIS — I480 Paroxysmal atrial fibrillation: Secondary | ICD-10-CM | POA: Diagnosis not present

## 2016-06-07 DIAGNOSIS — Z7982 Long term (current) use of aspirin: Secondary | ICD-10-CM | POA: Diagnosis not present

## 2016-06-07 DIAGNOSIS — I482 Chronic atrial fibrillation: Secondary | ICD-10-CM | POA: Diagnosis not present

## 2016-06-07 DIAGNOSIS — J449 Chronic obstructive pulmonary disease, unspecified: Secondary | ICD-10-CM | POA: Diagnosis not present

## 2016-06-07 DIAGNOSIS — Z9981 Dependence on supplemental oxygen: Secondary | ICD-10-CM | POA: Diagnosis not present

## 2016-06-07 DIAGNOSIS — J441 Chronic obstructive pulmonary disease with (acute) exacerbation: Secondary | ICD-10-CM | POA: Diagnosis not present

## 2016-06-07 DIAGNOSIS — C3490 Malignant neoplasm of unspecified part of unspecified bronchus or lung: Secondary | ICD-10-CM

## 2016-06-07 DIAGNOSIS — I4892 Unspecified atrial flutter: Secondary | ICD-10-CM | POA: Diagnosis not present

## 2016-06-07 LAB — CBC WITH DIFFERENTIAL/PLATELET
Basophils Absolute: 0 10*3/uL (ref 0.0–0.1)
Basophils Relative: 2 %
Eosinophils Absolute: 0 10*3/uL (ref 0.0–0.7)
Eosinophils Relative: 5 %
HCT: 26.8 % — ABNORMAL LOW (ref 39.0–52.0)
Hemoglobin: 8.8 g/dL — ABNORMAL LOW (ref 13.0–17.0)
Lymphocytes Relative: 36 %
Lymphs Abs: 0.2 10*3/uL — ABNORMAL LOW (ref 0.7–4.0)
MCH: 32.7 pg (ref 26.0–34.0)
MCHC: 32.8 g/dL (ref 30.0–36.0)
MCV: 99.6 fL (ref 78.0–100.0)
Monocytes Absolute: 0.1 10*3/uL (ref 0.1–1.0)
Monocytes Relative: 14 %
Neutro Abs: 0.2 10*3/uL — ABNORMAL LOW (ref 1.7–7.7)
Neutrophils Relative %: 43 %
Platelets: 22 10*3/uL — CL (ref 150–400)
RBC: 2.69 MIL/uL — ABNORMAL LOW (ref 4.22–5.81)
RDW: 16.7 % — ABNORMAL HIGH (ref 11.5–15.5)
WBC: 0.6 10*3/uL — CL (ref 4.0–10.5)

## 2016-06-07 LAB — BASIC METABOLIC PANEL
Anion gap: 6 (ref 5–15)
BUN: 18 mg/dL (ref 6–20)
CO2: 31 mmol/L (ref 22–32)
Calcium: 8.6 mg/dL — ABNORMAL LOW (ref 8.9–10.3)
Chloride: 100 mmol/L — ABNORMAL LOW (ref 101–111)
Creatinine, Ser: 1.05 mg/dL (ref 0.61–1.24)
GFR calc Af Amer: >60 mL/min (ref >60)
GFR calc non Af Amer: >60 mL/min (ref >60)
Glucose, Bld: 91 mg/dL (ref 65–99)
Potassium: 4.3 mmol/L (ref 3.5–5.1)
Sodium: 137 mmol/L (ref 135–145)

## 2016-06-07 NOTE — Progress Notes (Unsigned)
CRITICAL VALUE ALERT  Critical value received:  WBC 0.6; platelets 22,000  Date of notification:  06/07/16  Time of notification:  1215  Critical value read back:Yes.    Nurse who received alert:  A. Ouida Sills, RN  MD notified:  Fortunato Curling, NP at 1215  Per NP, we will recheck CBC/diff on Monday, 06/11/16.  Pt contacted and scheduled for lab appt.  Instructed to come to clinic after lab appt to wait for results.  Instructed on neutropenic precautions.  Pt denies any abnormal bruising or bleeding.  Pt verbalizes understanding of instructions given.

## 2016-06-08 DIAGNOSIS — Z79899 Other long term (current) drug therapy: Secondary | ICD-10-CM | POA: Diagnosis not present

## 2016-06-08 DIAGNOSIS — Z87891 Personal history of nicotine dependence: Secondary | ICD-10-CM | POA: Diagnosis not present

## 2016-06-08 DIAGNOSIS — R042 Hemoptysis: Secondary | ICD-10-CM | POA: Diagnosis not present

## 2016-06-08 DIAGNOSIS — I110 Hypertensive heart disease with heart failure: Secondary | ICD-10-CM | POA: Diagnosis not present

## 2016-06-08 DIAGNOSIS — J449 Chronic obstructive pulmonary disease, unspecified: Secondary | ICD-10-CM | POA: Diagnosis not present

## 2016-06-08 DIAGNOSIS — C3411 Malignant neoplasm of upper lobe, right bronchus or lung: Secondary | ICD-10-CM | POA: Diagnosis not present

## 2016-06-08 DIAGNOSIS — D6959 Other secondary thrombocytopenia: Secondary | ICD-10-CM | POA: Diagnosis not present

## 2016-06-08 DIAGNOSIS — Z7901 Long term (current) use of anticoagulants: Secondary | ICD-10-CM | POA: Diagnosis not present

## 2016-06-08 DIAGNOSIS — Z886 Allergy status to analgesic agent status: Secondary | ICD-10-CM | POA: Diagnosis not present

## 2016-06-08 DIAGNOSIS — Z8249 Family history of ischemic heart disease and other diseases of the circulatory system: Secondary | ICD-10-CM | POA: Diagnosis not present

## 2016-06-08 DIAGNOSIS — Z794 Long term (current) use of insulin: Secondary | ICD-10-CM | POA: Diagnosis not present

## 2016-06-08 DIAGNOSIS — K449 Diaphragmatic hernia without obstruction or gangrene: Secondary | ICD-10-CM | POA: Diagnosis not present

## 2016-06-08 DIAGNOSIS — D701 Agranulocytosis secondary to cancer chemotherapy: Secondary | ICD-10-CM | POA: Diagnosis not present

## 2016-06-08 DIAGNOSIS — T45515A Adverse effect of anticoagulants, initial encounter: Secondary | ICD-10-CM | POA: Diagnosis not present

## 2016-06-08 DIAGNOSIS — D62 Acute posthemorrhagic anemia: Secondary | ICD-10-CM | POA: Diagnosis not present

## 2016-06-08 DIAGNOSIS — D630 Anemia in neoplastic disease: Secondary | ICD-10-CM | POA: Diagnosis not present

## 2016-06-08 DIAGNOSIS — Z510 Encounter for antineoplastic radiation therapy: Secondary | ICD-10-CM | POA: Diagnosis not present

## 2016-06-08 DIAGNOSIS — E119 Type 2 diabetes mellitus without complications: Secondary | ICD-10-CM | POA: Diagnosis not present

## 2016-06-08 DIAGNOSIS — Z7982 Long term (current) use of aspirin: Secondary | ICD-10-CM | POA: Diagnosis not present

## 2016-06-08 DIAGNOSIS — D649 Anemia, unspecified: Secondary | ICD-10-CM | POA: Diagnosis not present

## 2016-06-08 DIAGNOSIS — I4891 Unspecified atrial fibrillation: Secondary | ICD-10-CM | POA: Diagnosis not present

## 2016-06-08 DIAGNOSIS — T451X5A Adverse effect of antineoplastic and immunosuppressive drugs, initial encounter: Secondary | ICD-10-CM | POA: Diagnosis not present

## 2016-06-08 DIAGNOSIS — C3481 Malignant neoplasm of overlapping sites of right bronchus and lung: Secondary | ICD-10-CM | POA: Diagnosis not present

## 2016-06-08 DIAGNOSIS — C3491 Malignant neoplasm of unspecified part of right bronchus or lung: Secondary | ICD-10-CM | POA: Diagnosis not present

## 2016-06-08 DIAGNOSIS — N400 Enlarged prostate without lower urinary tract symptoms: Secondary | ICD-10-CM | POA: Diagnosis not present

## 2016-06-08 DIAGNOSIS — D709 Neutropenia, unspecified: Secondary | ICD-10-CM | POA: Diagnosis not present

## 2016-06-08 DIAGNOSIS — Z825 Family history of asthma and other chronic lower respiratory diseases: Secondary | ICD-10-CM | POA: Diagnosis not present

## 2016-06-08 DIAGNOSIS — I5042 Chronic combined systolic (congestive) and diastolic (congestive) heart failure: Secondary | ICD-10-CM | POA: Diagnosis not present

## 2016-06-11 ENCOUNTER — Other Ambulatory Visit (HOSPITAL_COMMUNITY): Payer: Medicare Other

## 2016-06-12 DIAGNOSIS — C3491 Malignant neoplasm of unspecified part of right bronchus or lung: Secondary | ICD-10-CM | POA: Diagnosis not present

## 2016-06-12 DIAGNOSIS — Z9981 Dependence on supplemental oxygen: Secondary | ICD-10-CM | POA: Diagnosis not present

## 2016-06-12 DIAGNOSIS — E119 Type 2 diabetes mellitus without complications: Secondary | ICD-10-CM | POA: Diagnosis not present

## 2016-06-12 DIAGNOSIS — J441 Chronic obstructive pulmonary disease with (acute) exacerbation: Secondary | ICD-10-CM | POA: Diagnosis not present

## 2016-06-12 DIAGNOSIS — I5040 Unspecified combined systolic (congestive) and diastolic (congestive) heart failure: Secondary | ICD-10-CM | POA: Diagnosis not present

## 2016-06-12 DIAGNOSIS — Z794 Long term (current) use of insulin: Secondary | ICD-10-CM | POA: Diagnosis not present

## 2016-06-12 DIAGNOSIS — I110 Hypertensive heart disease with heart failure: Secondary | ICD-10-CM | POA: Diagnosis not present

## 2016-06-12 DIAGNOSIS — I482 Chronic atrial fibrillation: Secondary | ICD-10-CM | POA: Diagnosis not present

## 2016-06-13 DIAGNOSIS — I509 Heart failure, unspecified: Secondary | ICD-10-CM | POA: Diagnosis not present

## 2016-06-14 DIAGNOSIS — I110 Hypertensive heart disease with heart failure: Secondary | ICD-10-CM | POA: Diagnosis not present

## 2016-06-14 DIAGNOSIS — E119 Type 2 diabetes mellitus without complications: Secondary | ICD-10-CM | POA: Diagnosis not present

## 2016-06-14 DIAGNOSIS — Z794 Long term (current) use of insulin: Secondary | ICD-10-CM | POA: Diagnosis not present

## 2016-06-14 DIAGNOSIS — I5040 Unspecified combined systolic (congestive) and diastolic (congestive) heart failure: Secondary | ICD-10-CM | POA: Diagnosis not present

## 2016-06-14 DIAGNOSIS — J441 Chronic obstructive pulmonary disease with (acute) exacerbation: Secondary | ICD-10-CM | POA: Diagnosis not present

## 2016-06-14 DIAGNOSIS — I482 Chronic atrial fibrillation: Secondary | ICD-10-CM | POA: Diagnosis not present

## 2016-06-14 DIAGNOSIS — C3491 Malignant neoplasm of unspecified part of right bronchus or lung: Secondary | ICD-10-CM | POA: Diagnosis not present

## 2016-06-14 DIAGNOSIS — Z9981 Dependence on supplemental oxygen: Secondary | ICD-10-CM | POA: Diagnosis not present

## 2016-06-15 ENCOUNTER — Other Ambulatory Visit (HOSPITAL_COMMUNITY): Payer: Self-pay | Admitting: Oncology

## 2016-06-15 DIAGNOSIS — D708 Other neutropenia: Secondary | ICD-10-CM | POA: Diagnosis not present

## 2016-06-15 DIAGNOSIS — J440 Chronic obstructive pulmonary disease with acute lower respiratory infection: Secondary | ICD-10-CM | POA: Diagnosis not present

## 2016-06-15 DIAGNOSIS — D508 Other iron deficiency anemias: Secondary | ICD-10-CM | POA: Diagnosis not present

## 2016-06-19 ENCOUNTER — Telehealth (HOSPITAL_COMMUNITY): Payer: Self-pay

## 2016-06-19 ENCOUNTER — Encounter (HOSPITAL_COMMUNITY): Payer: Self-pay

## 2016-06-19 ENCOUNTER — Encounter (HOSPITAL_BASED_OUTPATIENT_CLINIC_OR_DEPARTMENT_OTHER): Payer: Medicare Other

## 2016-06-19 ENCOUNTER — Other Ambulatory Visit (HOSPITAL_COMMUNITY): Payer: Self-pay | Admitting: Oncology

## 2016-06-19 VITALS — BP 127/55 | HR 77 | Temp 98.0°F | Resp 18 | Wt 290.2 lb

## 2016-06-19 DIAGNOSIS — I429 Cardiomyopathy, unspecified: Secondary | ICD-10-CM | POA: Diagnosis not present

## 2016-06-19 DIAGNOSIS — J449 Chronic obstructive pulmonary disease, unspecified: Secondary | ICD-10-CM | POA: Diagnosis not present

## 2016-06-19 DIAGNOSIS — I480 Paroxysmal atrial fibrillation: Secondary | ICD-10-CM | POA: Diagnosis not present

## 2016-06-19 DIAGNOSIS — Z9889 Other specified postprocedural states: Secondary | ICD-10-CM | POA: Diagnosis not present

## 2016-06-19 DIAGNOSIS — E119 Type 2 diabetes mellitus without complications: Secondary | ICD-10-CM | POA: Diagnosis not present

## 2016-06-19 DIAGNOSIS — C3491 Malignant neoplasm of unspecified part of right bronchus or lung: Secondary | ICD-10-CM | POA: Diagnosis not present

## 2016-06-19 DIAGNOSIS — Z5111 Encounter for antineoplastic chemotherapy: Secondary | ICD-10-CM | POA: Diagnosis not present

## 2016-06-19 DIAGNOSIS — Z7982 Long term (current) use of aspirin: Secondary | ICD-10-CM | POA: Diagnosis not present

## 2016-06-19 DIAGNOSIS — I4892 Unspecified atrial flutter: Secondary | ICD-10-CM | POA: Diagnosis not present

## 2016-06-19 DIAGNOSIS — Z794 Long term (current) use of insulin: Secondary | ICD-10-CM | POA: Diagnosis not present

## 2016-06-19 DIAGNOSIS — Z87891 Personal history of nicotine dependence: Secondary | ICD-10-CM | POA: Diagnosis not present

## 2016-06-19 LAB — CBC WITH DIFFERENTIAL/PLATELET
Basophils Absolute: 0 10*3/uL (ref 0.0–0.1)
Basophils Relative: 0 %
Eosinophils Absolute: 0 10*3/uL (ref 0.0–0.7)
Eosinophils Relative: 1 %
HCT: 27.7 % — ABNORMAL LOW (ref 39.0–52.0)
Hemoglobin: 9.1 g/dL — ABNORMAL LOW (ref 13.0–17.0)
Lymphocytes Relative: 12 %
Lymphs Abs: 0.6 10*3/uL — ABNORMAL LOW (ref 0.7–4.0)
MCH: 32.9 pg (ref 26.0–34.0)
MCHC: 32.9 g/dL (ref 30.0–36.0)
MCV: 100 fL (ref 78.0–100.0)
Monocytes Absolute: 0.5 10*3/uL (ref 0.1–1.0)
Monocytes Relative: 10 %
Neutro Abs: 3.8 10*3/uL (ref 1.7–7.7)
Neutrophils Relative %: 77 %
Platelets: 176 10*3/uL (ref 150–400)
RBC: 2.77 MIL/uL — ABNORMAL LOW (ref 4.22–5.81)
RDW: 18.7 % — ABNORMAL HIGH (ref 11.5–15.5)
WBC: 4.9 10*3/uL (ref 4.0–10.5)

## 2016-06-19 LAB — COMPREHENSIVE METABOLIC PANEL
ALT: 21 U/L (ref 17–63)
AST: 22 U/L (ref 15–41)
Albumin: 3.2 g/dL — ABNORMAL LOW (ref 3.5–5.0)
Alkaline Phosphatase: 51 U/L (ref 38–126)
Anion gap: 8 (ref 5–15)
BUN: 14 mg/dL (ref 6–20)
CO2: 30 mmol/L (ref 22–32)
Calcium: 8.1 mg/dL — ABNORMAL LOW (ref 8.9–10.3)
Chloride: 100 mmol/L — ABNORMAL LOW (ref 101–111)
Creatinine, Ser: 1.3 mg/dL — ABNORMAL HIGH (ref 0.61–1.24)
GFR calc Af Amer: >60 mL/min (ref >60)
GFR calc non Af Amer: 57 mL/min — ABNORMAL LOW (ref >60)
Glucose, Bld: 147 mg/dL — ABNORMAL HIGH (ref 65–99)
Potassium: 3.8 mmol/L (ref 3.5–5.1)
Sodium: 138 mmol/L (ref 135–145)
Total Bilirubin: 0.4 mg/dL (ref 0.3–1.2)
Total Protein: 5.5 g/dL — ABNORMAL LOW (ref 6.5–8.1)

## 2016-06-19 LAB — MAGNESIUM: Magnesium: 1.3 mg/dL — ABNORMAL LOW (ref 1.7–2.4)

## 2016-06-19 MED ORDER — SODIUM CHLORIDE 0.9 % IV SOLN: 10.0000 mg | Freq: Once | INTRAVENOUS | Status: DC

## 2016-06-19 MED ORDER — ETOPOSIDE CHEMO INJECTION 1 GM/50ML
120.0000 mg/m2 | Freq: Once | INTRAVENOUS | Status: AC
  Administered 2016-06-19: 320 mg via INTRAVENOUS
  Filled 2016-06-19: qty 16

## 2016-06-19 MED ORDER — PALONOSETRON HCL INJECTION 0.25 MG/5ML
0.2500 mg | Freq: Once | INTRAVENOUS | Status: AC
  Administered 2016-06-19: 0.25 mg via INTRAVENOUS
  Filled 2016-06-19: qty 5

## 2016-06-19 MED ORDER — OXYCODONE-ACETAMINOPHEN 5-325 MG PO TABS: 1.0000 | ORAL_TABLET | ORAL | 0 refills | Status: DC | PRN

## 2016-06-19 MED ORDER — DEXAMETHASONE SODIUM PHOSPHATE 10 MG/ML IJ SOLN
10.0000 mg | Freq: Once | INTRAMUSCULAR | Status: AC
  Administered 2016-06-19: 10 mg via INTRAVENOUS
  Filled 2016-06-19: qty 1

## 2016-06-19 MED ORDER — MAGNESIUM SULFATE 2 GM/50ML IV SOLN
2.0000 g | Freq: Once | INTRAVENOUS | Status: AC
  Administered 2016-06-19: 2 g via INTRAVENOUS
  Filled 2016-06-19: qty 50

## 2016-06-19 MED ORDER — SODIUM CHLORIDE 0.9% FLUSH
10.0000 mL | INTRAVENOUS | Status: DC | PRN
  Administered 2016-06-19: 10 mL via INTRAVENOUS
  Filled 2016-06-19: qty 10

## 2016-06-19 MED ORDER — SODIUM CHLORIDE 0.9 % IV SOLN
INTRAVENOUS | Status: DC
  Administered 2016-06-19: 13:00:00 via INTRAVENOUS

## 2016-06-19 MED ORDER — MAGNESIUM SULFATE 2 GM/50ML IV SOLN
2.0000 g | Freq: Once | INTRAVENOUS | Status: DC
  Filled 2016-06-19: qty 50

## 2016-06-19 MED ORDER — HEPARIN SOD (PORK) LOCK FLUSH 100 UNIT/ML IV SOLN
500.0000 [IU] | Freq: Once | INTRAVENOUS | Status: AC
  Administered 2016-06-19: 500 [IU] via INTRAVENOUS

## 2016-06-19 NOTE — Progress Notes (Signed)
Baker Janus tolerated chemo tx and Magnesium infusion well without complaints or incident. Labs and pt's recent hospitalization records from [facility] reviewed with Kirby Crigler PA-C prior to administering chemotherapy. VSS upon discharge. Pt discharged with port left accessed and saline locked for use tomorrow. Pt discharged via wheelchair in satisfactory condition

## 2016-06-19 NOTE — Telephone Encounter (Signed)
Patient called stating he had been in the hospital recently and his "blood was too thin". He states he was coughing up blood and was bleeding from his port a cath. He also states his PCP, Dr. Sherrie Sport, told him not to have any more chemo or radiation for a while. Explained to patient that while his PCP was a good doctor, he was not an oncologist and he should discuss his treatments with the oncologist. Patient states he will keep his appt for day 1 of treatment today. Reviewed with PA, also who said patient needed to come in for labs and treatment today.

## 2016-06-19 NOTE — Patient Instructions (Signed)
[facility] Discharge Instructions for Patients Receiving Chemotherapy   Beginning January 23rd 2017 lab work for the [facility] will be done in the  Main lab at [facility] on 1st floor. If you have a lab appointment with the [place] please come in thru the  Main Entrance and check in at the main information desk   Today you received the following chemotherapy agents VP-16. Follow-up as scheduled. Call clinic for any questions or concerns  To help prevent nausea and vomiting after your treatment, we encourage you to take your nausea medication   If you develop nausea and vomiting, or diarrhea that is not controlled by your medication, call the clinic.  The clinic phone number is [phone]. Office hours are Monday-Friday 8:30am-5:00pm.  BELOW ARE SYMPTOMS THAT SHOULD BE REPORTED IMMEDIATELY:  *FEVER GREATER THAN 101.0 F  *CHILLS WITH OR WITHOUT FEVER  NAUSEA AND VOMITING THAT IS NOT CONTROLLED WITH YOUR NAUSEA MEDICATION  *UNUSUAL SHORTNESS OF BREATH  *UNUSUAL BRUISING OR BLEEDING  TENDERNESS IN MOUTH AND THROAT WITH OR WITHOUT PRESENCE OF ULCERS  *URINARY PROBLEMS  *BOWEL PROBLEMS  UNUSUAL RASH Items with * indicate a potential emergency and should be followed up as soon as possible. If you have an emergency after office hours please contact your primary care physician or go to the nearest emergency department.  Please call the clinic during office hours if you have any questions or concerns.   You may also contact the Patient Navigator at [phone] should you have any questions or need assistance in obtaining follow up care.      Resources For Cancer Patients and their Caregivers ? American Cancer Society: Can assist with transportation, wigs, general needs, runs Look Good Feel Better.        954-329-1305 ? Cancer Care: Provides financial assistance, online support groups, medication/co-pay assistance.  1-800-813-HOPE  6301042618) ? [place] Assists [place] cancer patients and their families through emotional , educational and financial support.  ([phone] ? [place] DSS Where to apply for food stamps, Medicaid and utility assistance. 206-484-3912 ? [employer]ATS: Transportation to medical appointments. ([phone] ? Social Security Administration: May apply for disability if have a Stage IV cancer. [phone] ? [employer], Disability and Transit Services: Assists with nutrition, care and transit needs. [phone]

## 2016-06-20 ENCOUNTER — Encounter (HOSPITAL_BASED_OUTPATIENT_CLINIC_OR_DEPARTMENT_OTHER): Payer: Medicare Other

## 2016-06-20 VITALS — BP 134/61 | HR 77 | Temp 97.6°F | Resp 16 | Wt 293.6 lb

## 2016-06-20 DIAGNOSIS — Z794 Long term (current) use of insulin: Secondary | ICD-10-CM | POA: Diagnosis not present

## 2016-06-20 DIAGNOSIS — I110 Hypertensive heart disease with heart failure: Secondary | ICD-10-CM | POA: Diagnosis not present

## 2016-06-20 DIAGNOSIS — E119 Type 2 diabetes mellitus without complications: Secondary | ICD-10-CM | POA: Diagnosis not present

## 2016-06-20 DIAGNOSIS — I480 Paroxysmal atrial fibrillation: Secondary | ICD-10-CM | POA: Diagnosis not present

## 2016-06-20 DIAGNOSIS — I482 Chronic atrial fibrillation: Secondary | ICD-10-CM | POA: Diagnosis not present

## 2016-06-20 DIAGNOSIS — R0602 Shortness of breath: Secondary | ICD-10-CM | POA: Diagnosis not present

## 2016-06-20 DIAGNOSIS — I429 Cardiomyopathy, unspecified: Secondary | ICD-10-CM | POA: Diagnosis not present

## 2016-06-20 DIAGNOSIS — Z9889 Other specified postprocedural states: Secondary | ICD-10-CM | POA: Diagnosis not present

## 2016-06-20 DIAGNOSIS — I4892 Unspecified atrial flutter: Secondary | ICD-10-CM | POA: Diagnosis not present

## 2016-06-20 DIAGNOSIS — Z5111 Encounter for antineoplastic chemotherapy: Secondary | ICD-10-CM

## 2016-06-20 DIAGNOSIS — Z87891 Personal history of nicotine dependence: Secondary | ICD-10-CM | POA: Diagnosis not present

## 2016-06-20 DIAGNOSIS — C3491 Malignant neoplasm of unspecified part of right bronchus or lung: Secondary | ICD-10-CM

## 2016-06-20 DIAGNOSIS — J449 Chronic obstructive pulmonary disease, unspecified: Secondary | ICD-10-CM | POA: Diagnosis not present

## 2016-06-20 DIAGNOSIS — J441 Chronic obstructive pulmonary disease with (acute) exacerbation: Secondary | ICD-10-CM | POA: Diagnosis not present

## 2016-06-20 DIAGNOSIS — I5040 Unspecified combined systolic (congestive) and diastolic (congestive) heart failure: Secondary | ICD-10-CM | POA: Diagnosis not present

## 2016-06-20 DIAGNOSIS — Z7982 Long term (current) use of aspirin: Secondary | ICD-10-CM | POA: Diagnosis not present

## 2016-06-20 DIAGNOSIS — Z9981 Dependence on supplemental oxygen: Secondary | ICD-10-CM | POA: Diagnosis not present

## 2016-06-20 LAB — CBC WITH DIFFERENTIAL/PLATELET
Basophils Absolute: 0 10*3/uL (ref 0.0–0.1)
Basophils Relative: 0 %
Eosinophils Absolute: 0 10*3/uL (ref 0.0–0.7)
Eosinophils Relative: 0 %
HCT: 28.8 % — ABNORMAL LOW (ref 39.0–52.0)
Hemoglobin: 9.4 g/dL — ABNORMAL LOW (ref 13.0–17.0)
Lymphocytes Relative: 4 %
Lymphs Abs: 0.3 10*3/uL — ABNORMAL LOW (ref 0.7–4.0)
MCH: 32.6 pg (ref 26.0–34.0)
MCHC: 32.6 g/dL (ref 30.0–36.0)
MCV: 100 fL (ref 78.0–100.0)
Monocytes Absolute: 0.2 10*3/uL (ref 0.1–1.0)
Monocytes Relative: 3 %
Neutro Abs: 7.1 10*3/uL (ref 1.7–7.7)
Neutrophils Relative %: 93 %
Platelets: 237 10*3/uL (ref 150–400)
RBC: 2.88 MIL/uL — ABNORMAL LOW (ref 4.22–5.81)
RDW: 19.1 % — ABNORMAL HIGH (ref 11.5–15.5)
WBC: 7.7 10*3/uL (ref 4.0–10.5)

## 2016-06-20 MED ORDER — SODIUM CHLORIDE 0.9 % IV SOLN
120.0000 mg/m2 | Freq: Once | INTRAVENOUS | Status: AC
  Administered 2016-06-20: 320 mg via INTRAVENOUS
  Filled 2016-06-20: qty 16

## 2016-06-20 MED ORDER — ALBUTEROL SULFATE (2.5 MG/3ML) 0.083% IN NEBU
2.5000 mg | INHALATION_SOLUTION | Freq: Once | RESPIRATORY_TRACT | Status: AC
  Administered 2016-06-20: 2.5 mg via RESPIRATORY_TRACT

## 2016-06-20 MED ORDER — HEPARIN SOD (PORK) LOCK FLUSH 100 UNIT/ML IV SOLN
500.0000 [IU] | Freq: Once | INTRAVENOUS | Status: AC | PRN
  Administered 2016-06-20: 500 [IU]

## 2016-06-20 MED ORDER — SODIUM CHLORIDE 0.9 % IV SOLN
48.0000 mg/m2 | Freq: Once | INTRAVENOUS | Status: AC
  Administered 2016-06-20: 127 mg via INTRAVENOUS
  Filled 2016-06-20: qty 127

## 2016-06-20 MED ORDER — SODIUM CHLORIDE 0.9 % IV SOLN
Freq: Once | INTRAVENOUS | Status: AC
  Administered 2016-06-20: 11:00:00 via INTRAVENOUS

## 2016-06-20 MED ORDER — POTASSIUM CHLORIDE 2 MEQ/ML IV SOLN
Freq: Once | INTRAVENOUS | Status: AC
  Administered 2016-06-20: 09:00:00 via INTRAVENOUS
  Filled 2016-06-20: qty 10

## 2016-06-20 MED ORDER — SODIUM CHLORIDE 0.9 % IV SOLN
Freq: Once | INTRAVENOUS | Status: AC
  Administered 2016-06-20: 11:00:00 via INTRAVENOUS
  Filled 2016-06-20: qty 5

## 2016-06-20 MED ORDER — ALBUTEROL SULFATE (2.5 MG/3ML) 0.083% IN NEBU
INHALATION_SOLUTION | RESPIRATORY_TRACT | Status: AC
  Filled 2016-06-20: qty 3

## 2016-06-20 NOTE — Progress Notes (Signed)
Tolerated chemo well. Stable on discharge home to self via wheelchair.

## 2016-06-20 NOTE — Patient Instructions (Signed)
[facility] Discharge Instructions for Patients Receiving Chemotherapy   Beginning January 23rd 2017 lab work for the [facility] will be done in the  Main lab at [facility] on 1st floor. If you have a lab appointment with the [place] please come in thru the  Main Entrance and check in at the main information desk   Today you received the following chemotherapy agents cisplatin and etoposide.  If you develop nausea and vomiting, or diarrhea that is not controlled by your medication, call the clinic.  The clinic phone number is [phone]. Office hours are Monday-Friday 8:30am-5:00pm.  BELOW ARE SYMPTOMS THAT SHOULD BE REPORTED IMMEDIATELY:  *FEVER GREATER THAN 101.0 F  *CHILLS WITH OR WITHOUT FEVER  NAUSEA AND VOMITING THAT IS NOT CONTROLLED WITH YOUR NAUSEA MEDICATION  *UNUSUAL SHORTNESS OF BREATH  *UNUSUAL BRUISING OR BLEEDING  TENDERNESS IN MOUTH AND THROAT WITH OR WITHOUT PRESENCE OF ULCERS  *URINARY PROBLEMS  *BOWEL PROBLEMS  UNUSUAL RASH Items with * indicate a potential emergency and should be followed up as soon as possible. If you have an emergency after office hours please contact your primary care physician or go to the nearest emergency department.  Please call the clinic during office hours if you have any questions or concerns.   You may also contact the Patient Navigator at [phone] should you have any questions or need assistance in obtaining follow up care.      Resources For Cancer Patients and their Caregivers ? American Cancer Society: Can assist with transportation, wigs, general needs, runs Look Good Feel Better.        5103298498 ? Cancer Care: Provides financial assistance, online support groups, medication/co-pay assistance.  1-800-813-HOPE 202 732 8307) ? [place] Assists [place] cancer patients and their families through emotional , educational and financial support.   3194468956 ? [place] DSS Where to apply for food stamps, Medicaid and utility assistance. [phone] ? RCATS: Transportation to medical appointments. ([phone] ? Social Security Administration: May apply for disability if have a Stage IV cancer. [phone] 601-037-7131 ? [employer], Disability and Transit Services: Assists with nutrition, care and transit needs. [phone]

## 2016-06-21 ENCOUNTER — Encounter (HOSPITAL_BASED_OUTPATIENT_CLINIC_OR_DEPARTMENT_OTHER): Payer: Medicare Other

## 2016-06-21 ENCOUNTER — Encounter (HOSPITAL_BASED_OUTPATIENT_CLINIC_OR_DEPARTMENT_OTHER): Payer: Medicare Other | Admitting: Oncology

## 2016-06-21 ENCOUNTER — Encounter (HOSPITAL_COMMUNITY): Payer: Self-pay

## 2016-06-21 VITALS — BP 136/67 | HR 69 | Temp 98.3°F | Resp 18

## 2016-06-21 VITALS — BP 133/68 | HR 72 | Temp 98.4°F | Wt 288.0 lb

## 2016-06-21 DIAGNOSIS — C3491 Malignant neoplasm of unspecified part of right bronchus or lung: Secondary | ICD-10-CM | POA: Diagnosis not present

## 2016-06-21 DIAGNOSIS — I5040 Unspecified combined systolic (congestive) and diastolic (congestive) heart failure: Secondary | ICD-10-CM | POA: Diagnosis not present

## 2016-06-21 DIAGNOSIS — C3411 Malignant neoplasm of upper lobe, right bronchus or lung: Secondary | ICD-10-CM | POA: Diagnosis not present

## 2016-06-21 DIAGNOSIS — Z794 Long term (current) use of insulin: Secondary | ICD-10-CM | POA: Diagnosis not present

## 2016-06-21 DIAGNOSIS — Z5111 Encounter for antineoplastic chemotherapy: Secondary | ICD-10-CM | POA: Diagnosis not present

## 2016-06-21 DIAGNOSIS — Z9981 Dependence on supplemental oxygen: Secondary | ICD-10-CM | POA: Diagnosis not present

## 2016-06-21 DIAGNOSIS — I110 Hypertensive heart disease with heart failure: Secondary | ICD-10-CM | POA: Diagnosis not present

## 2016-06-21 DIAGNOSIS — J441 Chronic obstructive pulmonary disease with (acute) exacerbation: Secondary | ICD-10-CM | POA: Diagnosis not present

## 2016-06-21 DIAGNOSIS — E119 Type 2 diabetes mellitus without complications: Secondary | ICD-10-CM | POA: Diagnosis not present

## 2016-06-21 DIAGNOSIS — I482 Chronic atrial fibrillation: Secondary | ICD-10-CM | POA: Diagnosis not present

## 2016-06-21 DIAGNOSIS — Z510 Encounter for antineoplastic radiation therapy: Secondary | ICD-10-CM | POA: Diagnosis not present

## 2016-06-21 DIAGNOSIS — C3490 Malignant neoplasm of unspecified part of unspecified bronchus or lung: Secondary | ICD-10-CM

## 2016-06-21 MED ORDER — SODIUM CHLORIDE 0.9 % IV SOLN: 10.0000 mg | Freq: Once | INTRAVENOUS | Status: DC

## 2016-06-21 MED ORDER — SODIUM CHLORIDE 0.9 % IV SOLN
120.0000 mg/m2 | Freq: Once | INTRAVENOUS | Status: AC
  Administered 2016-06-21: 320 mg via INTRAVENOUS
  Filled 2016-06-21: qty 16

## 2016-06-21 MED ORDER — DEXAMETHASONE SODIUM PHOSPHATE 10 MG/ML IJ SOLN
INTRAMUSCULAR | Status: AC
  Filled 2016-06-21: qty 1

## 2016-06-21 MED ORDER — HEPARIN SOD (PORK) LOCK FLUSH 100 UNIT/ML IV SOLN
500.0000 [IU] | Freq: Once | INTRAVENOUS | Status: AC | PRN
  Administered 2016-06-21: 500 [IU]

## 2016-06-21 MED ORDER — SODIUM CHLORIDE 0.9 % IV SOLN
Freq: Once | INTRAVENOUS | Status: AC
  Administered 2016-06-21: 10:00:00 via INTRAVENOUS

## 2016-06-21 MED ORDER — DEXAMETHASONE SODIUM PHOSPHATE 10 MG/ML IJ SOLN
10.0000 mg | Freq: Once | INTRAMUSCULAR | Status: AC
Start: 2016-06-21 — End: 2016-06-21
  Administered 2016-06-21: 10 mg via INTRAVENOUS

## 2016-06-21 NOTE — Patient Instructions (Signed)
[facility] Discharge Instructions for Patients Receiving Chemotherapy   Beginning January 23rd 2017 lab work for the [facility] will be done in the  Main lab at [facility] on 1st floor. If you have a lab appointment with the [place] please come in thru the  Main Entrance and check in at the main information desk   Today you received the following chemotherapy agents etoposide. Return tomorrow after 12 noon for neulasta injection.  If you develop nausea and vomiting, or diarrhea that is not controlled by your medication, call the clinic.  The clinic phone number is [phone]. Office hours are Monday-Friday 8:30am-5:00pm.  BELOW ARE SYMPTOMS THAT SHOULD BE REPORTED IMMEDIATELY:  *FEVER GREATER THAN 101.0 F  *CHILLS WITH OR WITHOUT FEVER  NAUSEA AND VOMITING THAT IS NOT CONTROLLED WITH YOUR NAUSEA MEDICATION  *UNUSUAL SHORTNESS OF BREATH  *UNUSUAL BRUISING OR BLEEDING  TENDERNESS IN MOUTH AND THROAT WITH OR WITHOUT PRESENCE OF ULCERS  *URINARY PROBLEMS  *BOWEL PROBLEMS  UNUSUAL RASH Items with * indicate a potential emergency and should be followed up as soon as possible. If you have an emergency after office hours please contact your primary care physician or go to the nearest emergency department.  Please call the clinic during office hours if you have any questions or concerns.   You may also contact the Patient Navigator at [phone] should you have any questions or need assistance in obtaining follow up care.      Resources For Cancer Patients and their Caregivers ? American Cancer Society: Can assist with transportation, wigs, general needs, runs Look Good Feel Better.        519-564-3155 ? Cancer Care: Provides financial assistance, online support groups, medication/co-pay assistance.  1-800-813-HOPE 734-389-0511) ? [place] Assists [place] cancer patients and their families through emotional ,  educational and financial support.  571-342-9941 ? [place] DSS Where to apply for food stamps, Medicaid and utility assistance. 925-842-5903 ? RCATS: Transportation to medical appointments. 606-196-0551 ? Social Security Administration: May apply for disability if have a Stage IV cancer. [phone] ? [employer], Disability and Transit Services: Assists with nutrition, care and transit needs. [phone]

## 2016-06-21 NOTE — Progress Notes (Signed)
[place]  Follow Up Progress Note  Patient Care Team: Neale Burly, MD as PCP - General (Internal Medicine)  CHIEF COMPLAINTS/PURPOSE OF CONSULTATION:  Small cell carcinoma of RUL  HISTORY OF PRESENTING ILLNESS:     Small cell lung cancer ([place])   04/03/2016 Initial Diagnosis    Small cell lung cancer ([place])     04/09/2016 Imaging    MRI brain- Negative for metastatic disease to the brain. No acute abnormality.      04/09/2016 Procedure    Right IJ port catheter placement by IR      04/13/2016 PET scan    . The right apical lung mass has significantly enlarged and there is new right paratracheal adenopathy as well as a hypermetabolic new lymph node in the right inner infraclavicular fossa potentially with early impingement on adjacent neurovascular structures. 2. Other imaging findings of potential clinical significance: Chronic bilateral maxillary sinusitis. Left carotid atherosclerotic calcification. Aortoiliac atherosclerotic vascular disease. Coronary atherosclerosis. Paraseptal emphysema. Mild atelectasis in the lung bases. Scattered sigmoid colon diverticula. Probable right pars defect at L5.      06/19/2016 Treatment Plan Change    Cisplatin dose reduced by 20% for cycle #4 due to severe thrombocytopenia following cycle #3 (13,000).       PREM COYKENDALL 66 y.o. male previous smoker with a history of severe COPD on 2L O2, PAF (on Xarelto), and DM who is here because of a RUL mass. The patient was evaluated by Dr. Lianne Moris in CT surgery, who noticed a RUL mass. A CT of the chest was done and showed a 4 cm hypermetabolic (SUV 34.9) RUL mass that was invading rib #1 and rib #2 posterolaterally on PET. On 03/22/16 patient underwent a bronchoscopy with right VATS and biopsy of RUL mass. Surgical path from biopsy showed high grade neuroendocrine carcinoma, most consistent with small cell carcinoma.   The patient presents today for  follow up. He was  hospitalized at [facility] since his last visit for coughing up blood. He states he was taken off of the xarelto, which he takes for A. Fib, and its been held for the past 2 weeks. He is currently still taking the aspirin. Since he was hospitalized his radiation treatments have been held. He has 13 more RT treatments left, his last treatment will be June 19. He will get day 3 of cycle 4 of chemo today. He continues to feel fatigued. No increase in his baseline SOB.   MEDICAL HISTORY:  Past Medical History:  Diagnosis Date  . Alcohol abuse    Quit 08/23/11  . Arthritis   . Atrial fibrillation ([place])   . Atrial flutter [facility])    s/p CTI ablation by Dr [name] 10/13  . Cancer ([place])    lung cancer  . Cardiomyopathy ([place])    LVEF 25-30% 8/13, previously normal by dobutamine echo 5/13  . Chest pain   . CHF (congestive heart failure) ([place])   . COPD (chronic obstructive pulmonary disease) ([place])   . Depression   . Dyspnea   . Essential hypertension, benign   . Obesity   . Prostate enlargement   . Type 2 diabetes mellitus ([place])     SURGICAL HISTORY: Past Surgical History:  Procedure Laterality Date  . APPENDECTOMY    . ATRIAL ABLATION SURGERY  11/06/2011   CTI ablation for atrial flutter by Dr [name]  . ATRIAL FLUTTER ABLATION N/A 11/06/2011   Procedure: ATRIAL FLUTTER ABLATION;  Surgeon: Jeneen Rinks  Allred, MD;  Location: [place] CATH LAB;  Service: Cardiovascular;  Laterality: N/A;  . Cataracts    . IR GENERIC HISTORICAL  04/09/2016   IR US GUIDE VASC ACCESS RIGHT 04/09/2016 [name], DO [facility]-INTERV RAD  . IR GENERIC HISTORICAL  04/09/2016   IR FLUORO GUIDE PORT INSERTION RIGHT 04/09/2016 [name], DO [facility]-INTERV RAD  . TONSILLECTOMY      SOCIAL HISTORY: Social History   Social History  . Marital status: Single    Spouse name: N/A  . Number of children: N/A  . Years of education: N/A   Occupational History  . retired      Building services engineer   Social History Main Topics  . Smoking status:  Former Smoker    Packs/day: 0.50    Years: 47.00    Types: Cigarettes  . Smokeless tobacco: Former Systems developer    Quit date: 04/10/2014     Comment: encouraged to quit today 03/04/12  . Alcohol use Yes     Comment: quit 2 years ago  . Drug use: No     Comment: marajuana occasionally  . Sexual activity: Not Currently   Other Topics Concern  . Not on file   Social History Narrative   Lives in [place] alone.   Disabled due to COPD   Previousliy worked as a Electrical engineer    FAMILY HISTORY: Family History  Problem Relation Age of Onset  . Diabetes Sister   . Hypertension Mother   . Cancer Mother        lung  . Hypertension Father   . Heart attack Father   . Hypertension Sister   . Hypertension Sister   . Hypertension Sister   . Pulmonary fibrosis Sister     ALLERGIES:  is allergic to codeine.  MEDICATIONS:  Current Outpatient Prescriptions  Medication Sig Dispense Refill  . albuterol (PROVENTIL HFA;VENTOLIN HFA) 108 (90 BASE) MCG/ACT inhaler Inhale 2 puffs into the lungs every 6 (six) hours as needed. For shortness of breath    . amiodarone (PACERONE) 100 MG tablet Take 100 mg by mouth 2 (two) times daily.    Marland Kitchen aspirin EC 81 MG tablet Take 81 mg by mouth daily.    . carvedilol (COREG) 25 MG tablet Take 25 mg by mouth 2 (two) times daily with a meal.    . CISPLATIN IV Inject into the vein every 21 ( twenty-one) days.    Marland Kitchen dexamethasone (DECADRON) 4 MG tablet Take 2 tablets two times a day for 1 day on day 4 after cisplatin chemotherapy. Take with food. 30 tablet 1  . diltiazem (CARDIZEM CD) 240 MG 24 hr capsule Take 240 mg by mouth.    . ETOPOSIDE IV Inject into the vein every 21 ( twenty-one) days. Days 1-3 every 21 days    . furosemide (LASIX) 40 MG tablet Take 40 mg by mouth daily.    Marland Kitchen gabapentin (NEURONTIN) 300 MG capsule Take by mouth.    . insulin glargine (LANTUS) 100 UNIT/ML injection Inject 25 Units into the skin 2 (two) times daily.    . insulin lispro  (HUMALOG) 100 UNIT/ML injection Inject into the skin. SSI    . ipratropium-albuterol (DUONEB) 0.5-2.5 (3) MG/3ML SOLN Take 3 mLs by nebulization every 6 (six) hours as needed (shortness of breath/wheezing).    Marland Kitchen lidocaine-prilocaine (EMLA) cream Apply to affected area once 30 g 3  . LORazepam (ATIVAN) 0.5 MG tablet Take 1 tablet (0.5 mg total) by mouth every 6 (  six) hours as needed (Nausea or vomiting). 30 tablet 0  . metformin (FORTAMET) 500 MG (OSM) 24 hr tablet Take 1,000 mg by mouth.    . Multiple Vitamin (MULTIVITAMIN) capsule Take by mouth.    . ondansetron (ZOFRAN) 8 MG tablet Take 1 tablet (8 mg total) by mouth 2 (two) times daily as needed. Start on the third day after cisplatin chemotherapy. (Patient taking differently: Take 8 mg by mouth 2 (two) times daily as needed. Start on the third day after cisplatin chemotherapy. # 1 med to take for nausea) 30 tablet 1  . oxyCODONE-acetaminophen (PERCOCET/ROXICET) 5-325 MG tablet Take 1-2 tablets by mouth every 4 (four) hours as needed for severe pain. 90 tablet 0  . Pegfilgrastim (NEULASTA ONPRO [place]) Inject into the skin every 21 ( twenty-one) days.    . prochlorperazine (COMPAZINE) 10 MG tablet Take 1 tablet (10 mg total) by mouth every 6 (six) hours as needed (Nausea or vomiting). (Patient taking differently: Take 10 mg by mouth every 6 (six) hours as needed (Nausea or vomiting). #2 to take for nausea) 30 tablet 1  . Rivaroxaban (XARELTO) 15 MG TABS tablet Take 15 mg by mouth.    . sucralfate (CARAFATE) 1 GM/10ML suspension Take by mouth.    . tamsulosin (FLOMAX) 0.4 MG CAPS capsule Take by mouth.     No current facility-administered medications for this visit.    Facility-Administered Medications Ordered in Other Visits  Medication Dose Route Frequency Provider Last Rate Last Dose  . etoposide (VEPESID) 320 mg in sodium chloride 0.9 % 1,000 mL chemo infusion  120 mg/m2 (Treatment Plan Recorded) Intravenous Once Twana First, MD 1,016 mL/hr at  06/21/16 1036 320 mg at 06/21/16 1036  . heparin lock flush 100 unit/mL  500 Units Intracatheter Once PRN Twana First, MD       Review of Systems  Constitutional: Positive for malaise/fatigue.       Appetite is good  HENT: Negative.   Eyes: Negative.   Respiratory: Positive for shortness of breath.        COPD  Gastrointestinal: Negative.   Genitourinary: Negative.   Musculoskeletal: Negative.   Skin: Negative.   Neurological: Negative for weakness.  Endo/Heme/Allergies: Negative.   Psychiatric/Behavioral: Negative.   All other systems reviewed and are negative. 14 point ROS was done and is otherwise as detailed above or in HPI  PHYSICAL EXAMINATION: ECOG PERFORMANCE STATUS: 2 - Symptomatic, <50% confined to bed  Vitals:   06/21/16 0956  BP: 133/68  Pulse: 72  Temp: 98.4 F (36.9 C)   Filed Weights   06/21/16 0956  Weight: 288 lb (130.6 kg)      Physical Exam  Constitutional: He is oriented to person, place, and time and well-developed, well-nourished, and in no distress.  HENT:  Head: Normocephalic and atraumatic.  Mouth/Throat: Oropharynx is clear and moist.  Eyes: Conjunctivae and EOM are normal. Pupils are equal, round, and reactive to light.  Neck: Normal range of motion. Neck supple.  Cardiovascular: Normal rate, regular rhythm and normal heart sounds.   Pulmonary/Chest: Effort normal and breath sounds normal. No respiratory distress.  Abdominal: Soft. Bowel sounds are normal.  Musculoskeletal: Normal range of motion.  Neurological: He is alert and oriented to person, place, and time. Gait normal.  Skin: Skin is warm and dry.  Nursing note and vitals reviewed.  LABORATORY DATA:  I have reviewed the data as listed. Lab Results  Component Value Date   WBC 7.7 06/20/2016   HGB  9.4 (L) 06/20/2016   HCT 28.8 (L) 06/20/2016   MCV 100.0 06/20/2016   PLT 237 06/20/2016   CMP     Component Value Date/Time   NA 138 06/19/2016 1133   K 3.8 06/19/2016 1133     CL 100 (L) 06/19/2016 1133   CO2 30 06/19/2016 1133   GLUCOSE 147 (H) 06/19/2016 1133   BUN 14 06/19/2016 1133   CREATININE 1.30 (H) 06/19/2016 1133   CALCIUM 8.1 (L) 06/19/2016 1133   PROT 5.5 (L) 06/19/2016 1133   ALBUMIN 3.2 (L) 06/19/2016 1133   AST 22 06/19/2016 1133   ALT 21 06/19/2016 1133   ALKPHOS 51 06/19/2016 1133   BILITOT 0.4 06/19/2016 1133   GFRNONAA 57 (L) 06/19/2016 1133   GFRAA >60 06/19/2016 1133   PATHOLOGY:  Outside report 03/22/2016 Diagnosis: "RIGHT UPPER LOBE MASS"; BIOPSY: High grade neuroendocrine carcinoma, most consistent with small cell carcinoma.  Note: the tumor cells are positive for CK7, TTF-1 (diffuse and strong), synaptophysin (diffuse and strong), chromogranin (focal and weak) while negative for Napsin A, CK20, CDX2 and p63 supporting the diagnosis.  A Ki67 stain shows a proliferative rate of 40%  RADIOGRAPHIC STUDIES: I have personally reviewed the radiological images as listed and agreed with the findings in the report.  PET scan 12/22/2015 IMPRESSION: 1. Hypermetabolic RIGHT upper lobe mass abutting pleural surface of the RIGHT lung apex consistent with primary bronchogenic carcinoma. 2. No evidence of metastatic disease.  T2a N0 M0 . 3. Paraseptal emphysema.  Outside Imaging 12/09/2015 Chest CT IMPRESSION: 4 cm spiculated right apical mass, adjacent to 2nd rib. No mediastinal adenopathy.   MRI HEAD WITHOUT AND WITH CONTRAST 04/09/2016  IMPRESSION: Negative for metastatic disease to the brain.  No acute abnormality.  NUCLEAR MEDICINE PET SKULL BASE TO THIGH 04/13/2016   IMPRESSION: 1. The right apical lung mass has significantly enlarged and there is new right paratracheal adenopathy as well as a hypermetabolic new lymph node in the right inner infraclavicular fossa potentially with early impingement on adjacent neurovascular structures. 2. Other imaging findings of potential clinical significance: Chronic bilateral  maxillary sinusitis. Left carotid atherosclerotic calcification. Aortoiliac atherosclerotic vascular disease. Coronary atherosclerosis. Paraseptal emphysema. Mild atelectasis in the lung bases. Scattered sigmoid colon diverticula. Probable right pars defect at L5.  ASSESSMENT & PLAN:  Small cell carcinoma of RUL  Cancer Staging Small cell lung cancer ([place]) Staging form: Lung, AJCC 8th Edition - Clinical: Stage IIIC (cT3, cN3, cM0) - Signed by Twana First, MD on 04/16/2016    PLAN: Currently getting treatment with concurrent chemoRT. Radiation delayed due to recent hospitalization at [facility]. He has 13 treatments left which he will finish on June 19th.  Today will be his last day of chemo with cisplatin and etoposide with cycle 4 day 3. He will be coming in tomorrow for neulasta injection.  I have ordered a restaging PET to be performed in late July which will be about 4-5 weeks after he completes RT.   RTC for follow up in late July to review PET and labs.   All questions were answered. The patient knows to call the clinic with any problems, questions or concerns.  This note was electronically signed.    Twana First, MD 06/21/2016 11:05 AM

## 2016-06-21 NOTE — Progress Notes (Signed)
Tolerated chemo well. Stable on discharge home to self via wheelchair.

## 2016-06-21 NOTE — Patient Instructions (Addendum)
[place] at [facility] Discharge Instructions  RECOMMENDATIONS MADE BY THE CONSULTANT AND ANY TEST RESULTS WILL BE SENT TO YOUR REFERRING PHYSICIAN.  You were seen today by Dr. Twana First Follow up in 8 weeks Lab work and PET scan a few days before next visit    Thank you for choosing [place] at [facility] to provide your oncology and hematology care.  To afford each patient quality time with our provider, please arrive at least 15 minutes before your scheduled appointment time.    If you have a lab appointment with the [place] please come in thru the  Main Entrance and check in at the main information desk  You need to re-schedule your appointment should you arrive 10 or more minutes late.  We strive to give you quality time with our providers, and arriving late affects you and other patients whose appointments are after yours.  Also, if you no show three or more times for appointments you may be dismissed from the clinic at the providers discretion.     Again, thank you for choosing [facility].  Our hope is that these requests will decrease the amount of time that you wait before being seen by our physicians.       _____________________________________________________________  Should you have questions after your visit to [facility], please contact our office at [phone] between the hours of 8:30 a.m. and 4:30 p.m.  Voicemails left after 4:30 p.m. will not be returned until the following business day.  For prescription refill requests, have your pharmacy contact our office.       Resources For Cancer Patients and their Caregivers ? American Cancer Society: Can assist with transportation, wigs, general needs, runs Look Good Feel Better.        716-184-1101 ? Cancer Care: Provides financial assistance, online support groups, medication/co-pay assistance.  1-800-813-HOPE (630)363-7326) ? [place] Assists [place] cancer patients and their families through emotional , educational and financial support.  450-055-2572 ? [place] DSS Where to apply for food stamps, Medicaid and utility assistance. 918-857-4228 ? [employer]: Transportation to medical appointments. 609-538-1570 ? Social Security Administration: May apply for disability if have a Stage IV cancer. [phone] 207-481-5289 ? [employer], Disability and Transit Services: Assists with nutrition, care and transit needs. [place] Support Programs: @10RELATIVEDAYS @ > Cancer Support Group  2nd Tuesday of the month 1pm-2pm, Journey Room  > Creative Journey  3rd Tuesday of the month 1130am-1pm, Journey Room  > Look Good Feel Better  1st Wednesday of the month 10am-12 noon, Journey Room (Call [place] to register [phone])

## 2016-06-22 ENCOUNTER — Encounter (HOSPITAL_COMMUNITY): Payer: Medicare Other | Attending: Oncology

## 2016-06-22 VITALS — BP 106/47 | HR 70 | Temp 98.2°F | Resp 20

## 2016-06-22 DIAGNOSIS — E119 Type 2 diabetes mellitus without complications: Secondary | ICD-10-CM | POA: Insufficient documentation

## 2016-06-22 DIAGNOSIS — I5040 Unspecified combined systolic (congestive) and diastolic (congestive) heart failure: Secondary | ICD-10-CM | POA: Diagnosis not present

## 2016-06-22 DIAGNOSIS — J441 Chronic obstructive pulmonary disease with (acute) exacerbation: Secondary | ICD-10-CM | POA: Diagnosis not present

## 2016-06-22 DIAGNOSIS — C3411 Malignant neoplasm of upper lobe, right bronchus or lung: Secondary | ICD-10-CM | POA: Diagnosis not present

## 2016-06-22 DIAGNOSIS — I480 Paroxysmal atrial fibrillation: Secondary | ICD-10-CM | POA: Insufficient documentation

## 2016-06-22 DIAGNOSIS — J449 Chronic obstructive pulmonary disease, unspecified: Secondary | ICD-10-CM | POA: Insufficient documentation

## 2016-06-22 DIAGNOSIS — C3491 Malignant neoplasm of unspecified part of right bronchus or lung: Secondary | ICD-10-CM | POA: Insufficient documentation

## 2016-06-22 DIAGNOSIS — I482 Chronic atrial fibrillation: Secondary | ICD-10-CM | POA: Diagnosis not present

## 2016-06-22 DIAGNOSIS — F1010 Alcohol abuse, uncomplicated: Secondary | ICD-10-CM | POA: Insufficient documentation

## 2016-06-22 DIAGNOSIS — Z9981 Dependence on supplemental oxygen: Secondary | ICD-10-CM | POA: Diagnosis not present

## 2016-06-22 DIAGNOSIS — Z87891 Personal history of nicotine dependence: Secondary | ICD-10-CM | POA: Insufficient documentation

## 2016-06-22 DIAGNOSIS — I110 Hypertensive heart disease with heart failure: Secondary | ICD-10-CM | POA: Diagnosis not present

## 2016-06-22 DIAGNOSIS — I429 Cardiomyopathy, unspecified: Secondary | ICD-10-CM | POA: Insufficient documentation

## 2016-06-22 DIAGNOSIS — I4892 Unspecified atrial flutter: Secondary | ICD-10-CM | POA: Insufficient documentation

## 2016-06-22 DIAGNOSIS — Z7982 Long term (current) use of aspirin: Secondary | ICD-10-CM | POA: Insufficient documentation

## 2016-06-22 DIAGNOSIS — F329 Major depressive disorder, single episode, unspecified: Secondary | ICD-10-CM | POA: Insufficient documentation

## 2016-06-22 DIAGNOSIS — Z5189 Encounter for other specified aftercare: Secondary | ICD-10-CM | POA: Diagnosis not present

## 2016-06-22 DIAGNOSIS — Z794 Long term (current) use of insulin: Secondary | ICD-10-CM | POA: Diagnosis not present

## 2016-06-22 DIAGNOSIS — J439 Emphysema, unspecified: Secondary | ICD-10-CM | POA: Diagnosis not present

## 2016-06-22 DIAGNOSIS — Z9889 Other specified postprocedural states: Secondary | ICD-10-CM | POA: Insufficient documentation

## 2016-06-22 DIAGNOSIS — Z510 Encounter for antineoplastic radiation therapy: Secondary | ICD-10-CM | POA: Diagnosis not present

## 2016-06-22 MED ORDER — PEGFILGRASTIM INJECTION 6 MG/0.6ML ~~LOC~~
6.0000 mg | PREFILLED_SYRINGE | Freq: Once | SUBCUTANEOUS | Status: AC
  Administered 2016-06-22: 6 mg via SUBCUTANEOUS

## 2016-06-22 MED ORDER — PEGFILGRASTIM INJECTION 6 MG/0.6ML ~~LOC~~
PREFILLED_SYRINGE | SUBCUTANEOUS | Status: AC
  Filled 2016-06-22: qty 0.6

## 2016-06-22 NOTE — Progress Notes (Signed)
Chase Herrera presents today for injection per MD orders. Neulasta 6mg  administered SQ in right Abdomen. Administration without incident. Patient tolerated well.

## 2016-06-25 DIAGNOSIS — E119 Type 2 diabetes mellitus without complications: Secondary | ICD-10-CM | POA: Diagnosis not present

## 2016-06-25 DIAGNOSIS — I110 Hypertensive heart disease with heart failure: Secondary | ICD-10-CM | POA: Diagnosis not present

## 2016-06-25 DIAGNOSIS — J441 Chronic obstructive pulmonary disease with (acute) exacerbation: Secondary | ICD-10-CM | POA: Diagnosis not present

## 2016-06-25 DIAGNOSIS — Z9981 Dependence on supplemental oxygen: Secondary | ICD-10-CM | POA: Diagnosis not present

## 2016-06-25 DIAGNOSIS — C3411 Malignant neoplasm of upper lobe, right bronchus or lung: Secondary | ICD-10-CM | POA: Diagnosis not present

## 2016-06-25 DIAGNOSIS — I482 Chronic atrial fibrillation: Secondary | ICD-10-CM | POA: Diagnosis not present

## 2016-06-25 DIAGNOSIS — I5040 Unspecified combined systolic (congestive) and diastolic (congestive) heart failure: Secondary | ICD-10-CM | POA: Diagnosis not present

## 2016-06-25 DIAGNOSIS — Z510 Encounter for antineoplastic radiation therapy: Secondary | ICD-10-CM | POA: Diagnosis not present

## 2016-06-25 DIAGNOSIS — C3491 Malignant neoplasm of unspecified part of right bronchus or lung: Secondary | ICD-10-CM | POA: Diagnosis not present

## 2016-06-25 DIAGNOSIS — Z794 Long term (current) use of insulin: Secondary | ICD-10-CM | POA: Diagnosis not present

## 2016-06-26 DIAGNOSIS — C3491 Malignant neoplasm of unspecified part of right bronchus or lung: Secondary | ICD-10-CM | POA: Diagnosis not present

## 2016-06-26 DIAGNOSIS — C3411 Malignant neoplasm of upper lobe, right bronchus or lung: Secondary | ICD-10-CM | POA: Diagnosis not present

## 2016-06-26 DIAGNOSIS — E119 Type 2 diabetes mellitus without complications: Secondary | ICD-10-CM | POA: Diagnosis not present

## 2016-06-26 DIAGNOSIS — Z510 Encounter for antineoplastic radiation therapy: Secondary | ICD-10-CM | POA: Diagnosis not present

## 2016-06-26 DIAGNOSIS — Z9981 Dependence on supplemental oxygen: Secondary | ICD-10-CM | POA: Diagnosis not present

## 2016-06-26 DIAGNOSIS — J441 Chronic obstructive pulmonary disease with (acute) exacerbation: Secondary | ICD-10-CM | POA: Diagnosis not present

## 2016-06-26 DIAGNOSIS — I110 Hypertensive heart disease with heart failure: Secondary | ICD-10-CM | POA: Diagnosis not present

## 2016-06-26 DIAGNOSIS — I5040 Unspecified combined systolic (congestive) and diastolic (congestive) heart failure: Secondary | ICD-10-CM | POA: Diagnosis not present

## 2016-06-26 DIAGNOSIS — Z794 Long term (current) use of insulin: Secondary | ICD-10-CM | POA: Diagnosis not present

## 2016-06-26 DIAGNOSIS — I482 Chronic atrial fibrillation: Secondary | ICD-10-CM | POA: Diagnosis not present

## 2016-06-27 DIAGNOSIS — C3491 Malignant neoplasm of unspecified part of right bronchus or lung: Secondary | ICD-10-CM | POA: Diagnosis not present

## 2016-06-27 DIAGNOSIS — Z794 Long term (current) use of insulin: Secondary | ICD-10-CM | POA: Diagnosis not present

## 2016-06-27 DIAGNOSIS — Z510 Encounter for antineoplastic radiation therapy: Secondary | ICD-10-CM | POA: Diagnosis not present

## 2016-06-27 DIAGNOSIS — J441 Chronic obstructive pulmonary disease with (acute) exacerbation: Secondary | ICD-10-CM | POA: Diagnosis not present

## 2016-06-27 DIAGNOSIS — I110 Hypertensive heart disease with heart failure: Secondary | ICD-10-CM | POA: Diagnosis not present

## 2016-06-27 DIAGNOSIS — Z9981 Dependence on supplemental oxygen: Secondary | ICD-10-CM | POA: Diagnosis not present

## 2016-06-27 DIAGNOSIS — I5040 Unspecified combined systolic (congestive) and diastolic (congestive) heart failure: Secondary | ICD-10-CM | POA: Diagnosis not present

## 2016-06-27 DIAGNOSIS — C3411 Malignant neoplasm of upper lobe, right bronchus or lung: Secondary | ICD-10-CM | POA: Diagnosis not present

## 2016-06-27 DIAGNOSIS — E119 Type 2 diabetes mellitus without complications: Secondary | ICD-10-CM | POA: Diagnosis not present

## 2016-06-27 DIAGNOSIS — I482 Chronic atrial fibrillation: Secondary | ICD-10-CM | POA: Diagnosis not present

## 2016-06-28 DIAGNOSIS — Z510 Encounter for antineoplastic radiation therapy: Secondary | ICD-10-CM | POA: Diagnosis not present

## 2016-06-28 DIAGNOSIS — C3411 Malignant neoplasm of upper lobe, right bronchus or lung: Secondary | ICD-10-CM | POA: Diagnosis not present

## 2016-06-29 DIAGNOSIS — C3411 Malignant neoplasm of upper lobe, right bronchus or lung: Secondary | ICD-10-CM | POA: Diagnosis not present

## 2016-06-29 DIAGNOSIS — Z510 Encounter for antineoplastic radiation therapy: Secondary | ICD-10-CM | POA: Diagnosis not present

## 2016-07-01 DIAGNOSIS — I509 Heart failure, unspecified: Secondary | ICD-10-CM | POA: Diagnosis not present

## 2016-07-02 DIAGNOSIS — Z510 Encounter for antineoplastic radiation therapy: Secondary | ICD-10-CM | POA: Diagnosis not present

## 2016-07-02 DIAGNOSIS — C3411 Malignant neoplasm of upper lobe, right bronchus or lung: Secondary | ICD-10-CM | POA: Diagnosis not present

## 2016-07-03 DIAGNOSIS — Z794 Long term (current) use of insulin: Secondary | ICD-10-CM | POA: Diagnosis not present

## 2016-07-03 DIAGNOSIS — Z9981 Dependence on supplemental oxygen: Secondary | ICD-10-CM | POA: Diagnosis not present

## 2016-07-03 DIAGNOSIS — I482 Chronic atrial fibrillation: Secondary | ICD-10-CM | POA: Diagnosis not present

## 2016-07-03 DIAGNOSIS — I5040 Unspecified combined systolic (congestive) and diastolic (congestive) heart failure: Secondary | ICD-10-CM | POA: Diagnosis not present

## 2016-07-03 DIAGNOSIS — I110 Hypertensive heart disease with heart failure: Secondary | ICD-10-CM | POA: Diagnosis not present

## 2016-07-03 DIAGNOSIS — C3411 Malignant neoplasm of upper lobe, right bronchus or lung: Secondary | ICD-10-CM | POA: Diagnosis not present

## 2016-07-03 DIAGNOSIS — Z510 Encounter for antineoplastic radiation therapy: Secondary | ICD-10-CM | POA: Diagnosis not present

## 2016-07-03 DIAGNOSIS — E119 Type 2 diabetes mellitus without complications: Secondary | ICD-10-CM | POA: Diagnosis not present

## 2016-07-03 DIAGNOSIS — J441 Chronic obstructive pulmonary disease with (acute) exacerbation: Secondary | ICD-10-CM | POA: Diagnosis not present

## 2016-07-03 DIAGNOSIS — C3491 Malignant neoplasm of unspecified part of right bronchus or lung: Secondary | ICD-10-CM | POA: Diagnosis not present

## 2016-07-04 DIAGNOSIS — C3411 Malignant neoplasm of upper lobe, right bronchus or lung: Secondary | ICD-10-CM | POA: Diagnosis not present

## 2016-07-04 DIAGNOSIS — Z510 Encounter for antineoplastic radiation therapy: Secondary | ICD-10-CM | POA: Diagnosis not present

## 2016-07-05 DIAGNOSIS — Z510 Encounter for antineoplastic radiation therapy: Secondary | ICD-10-CM | POA: Diagnosis not present

## 2016-07-05 DIAGNOSIS — C3411 Malignant neoplasm of upper lobe, right bronchus or lung: Secondary | ICD-10-CM | POA: Diagnosis not present

## 2016-07-06 DIAGNOSIS — Z794 Long term (current) use of insulin: Secondary | ICD-10-CM | POA: Diagnosis not present

## 2016-07-06 DIAGNOSIS — Z7982 Long term (current) use of aspirin: Secondary | ICD-10-CM | POA: Diagnosis not present

## 2016-07-06 DIAGNOSIS — Z8249 Family history of ischemic heart disease and other diseases of the circulatory system: Secondary | ICD-10-CM | POA: Diagnosis not present

## 2016-07-06 DIAGNOSIS — J029 Acute pharyngitis, unspecified: Secondary | ICD-10-CM | POA: Diagnosis not present

## 2016-07-06 DIAGNOSIS — R05 Cough: Secondary | ICD-10-CM | POA: Diagnosis not present

## 2016-07-06 DIAGNOSIS — R531 Weakness: Secondary | ICD-10-CM | POA: Diagnosis not present

## 2016-07-06 DIAGNOSIS — Z510 Encounter for antineoplastic radiation therapy: Secondary | ICD-10-CM | POA: Diagnosis not present

## 2016-07-06 DIAGNOSIS — D6489 Other specified anemias: Secondary | ICD-10-CM | POA: Diagnosis not present

## 2016-07-06 DIAGNOSIS — C3490 Malignant neoplasm of unspecified part of unspecified bronchus or lung: Secondary | ICD-10-CM | POA: Diagnosis not present

## 2016-07-06 DIAGNOSIS — Z79899 Other long term (current) drug therapy: Secondary | ICD-10-CM | POA: Diagnosis not present

## 2016-07-06 DIAGNOSIS — R0602 Shortness of breath: Secondary | ICD-10-CM | POA: Diagnosis not present

## 2016-07-06 DIAGNOSIS — R932 Abnormal findings on diagnostic imaging of liver and biliary tract: Secondary | ICD-10-CM | POA: Diagnosis not present

## 2016-07-06 DIAGNOSIS — J449 Chronic obstructive pulmonary disease, unspecified: Secondary | ICD-10-CM | POA: Diagnosis not present

## 2016-07-06 DIAGNOSIS — Z886 Allergy status to analgesic agent status: Secondary | ICD-10-CM | POA: Diagnosis not present

## 2016-07-06 DIAGNOSIS — D649 Anemia, unspecified: Secondary | ICD-10-CM | POA: Diagnosis not present

## 2016-07-06 DIAGNOSIS — C3411 Malignant neoplasm of upper lobe, right bronchus or lung: Secondary | ICD-10-CM | POA: Diagnosis not present

## 2016-07-06 DIAGNOSIS — E119 Type 2 diabetes mellitus without complications: Secondary | ICD-10-CM | POA: Diagnosis not present

## 2016-07-06 DIAGNOSIS — I110 Hypertensive heart disease with heart failure: Secondary | ICD-10-CM | POA: Diagnosis not present

## 2016-07-06 DIAGNOSIS — J441 Chronic obstructive pulmonary disease with (acute) exacerbation: Secondary | ICD-10-CM | POA: Diagnosis not present

## 2016-07-06 DIAGNOSIS — R0789 Other chest pain: Secondary | ICD-10-CM | POA: Diagnosis not present

## 2016-07-06 DIAGNOSIS — R7989 Other specified abnormal findings of blood chemistry: Secondary | ICD-10-CM | POA: Diagnosis not present

## 2016-07-06 DIAGNOSIS — I482 Chronic atrial fibrillation: Secondary | ICD-10-CM | POA: Diagnosis not present

## 2016-07-06 DIAGNOSIS — K449 Diaphragmatic hernia without obstruction or gangrene: Secondary | ICD-10-CM | POA: Diagnosis not present

## 2016-07-06 DIAGNOSIS — Z836 Family history of other diseases of the respiratory system: Secondary | ICD-10-CM | POA: Diagnosis not present

## 2016-07-06 DIAGNOSIS — Z801 Family history of malignant neoplasm of trachea, bronchus and lung: Secondary | ICD-10-CM | POA: Diagnosis not present

## 2016-07-06 DIAGNOSIS — I5040 Unspecified combined systolic (congestive) and diastolic (congestive) heart failure: Secondary | ICD-10-CM | POA: Diagnosis not present

## 2016-07-07 DIAGNOSIS — D6489 Other specified anemias: Secondary | ICD-10-CM | POA: Diagnosis not present

## 2016-07-07 DIAGNOSIS — J441 Chronic obstructive pulmonary disease with (acute) exacerbation: Secondary | ICD-10-CM | POA: Diagnosis not present

## 2016-07-07 DIAGNOSIS — R0789 Other chest pain: Secondary | ICD-10-CM | POA: Diagnosis not present

## 2016-07-07 DIAGNOSIS — E119 Type 2 diabetes mellitus without complications: Secondary | ICD-10-CM | POA: Diagnosis not present

## 2016-07-07 DIAGNOSIS — I482 Chronic atrial fibrillation: Secondary | ICD-10-CM | POA: Diagnosis not present

## 2016-07-09 DIAGNOSIS — I482 Chronic atrial fibrillation: Secondary | ICD-10-CM | POA: Diagnosis not present

## 2016-07-09 DIAGNOSIS — R0789 Other chest pain: Secondary | ICD-10-CM | POA: Diagnosis not present

## 2016-07-09 DIAGNOSIS — J441 Chronic obstructive pulmonary disease with (acute) exacerbation: Secondary | ICD-10-CM | POA: Diagnosis not present

## 2016-07-09 DIAGNOSIS — D6489 Other specified anemias: Secondary | ICD-10-CM | POA: Diagnosis not present

## 2016-07-09 DIAGNOSIS — E119 Type 2 diabetes mellitus without complications: Secondary | ICD-10-CM | POA: Diagnosis not present

## 2016-07-10 DIAGNOSIS — Z510 Encounter for antineoplastic radiation therapy: Secondary | ICD-10-CM | POA: Diagnosis not present

## 2016-07-10 DIAGNOSIS — C3411 Malignant neoplasm of upper lobe, right bronchus or lung: Secondary | ICD-10-CM | POA: Diagnosis not present

## 2016-07-11 ENCOUNTER — Other Ambulatory Visit (HOSPITAL_COMMUNITY): Payer: Self-pay | Admitting: Oncology

## 2016-07-11 DIAGNOSIS — C3411 Malignant neoplasm of upper lobe, right bronchus or lung: Secondary | ICD-10-CM | POA: Diagnosis not present

## 2016-07-11 DIAGNOSIS — Z510 Encounter for antineoplastic radiation therapy: Secondary | ICD-10-CM | POA: Diagnosis not present

## 2016-07-12 DIAGNOSIS — Z510 Encounter for antineoplastic radiation therapy: Secondary | ICD-10-CM | POA: Diagnosis not present

## 2016-07-12 DIAGNOSIS — C3411 Malignant neoplasm of upper lobe, right bronchus or lung: Secondary | ICD-10-CM | POA: Diagnosis not present

## 2016-07-12 DIAGNOSIS — C3491 Malignant neoplasm of unspecified part of right bronchus or lung: Secondary | ICD-10-CM | POA: Diagnosis not present

## 2016-07-12 DIAGNOSIS — Z9981 Dependence on supplemental oxygen: Secondary | ICD-10-CM | POA: Diagnosis not present

## 2016-07-12 DIAGNOSIS — I110 Hypertensive heart disease with heart failure: Secondary | ICD-10-CM | POA: Diagnosis not present

## 2016-07-12 DIAGNOSIS — I482 Chronic atrial fibrillation: Secondary | ICD-10-CM | POA: Diagnosis not present

## 2016-07-12 DIAGNOSIS — Z794 Long term (current) use of insulin: Secondary | ICD-10-CM | POA: Diagnosis not present

## 2016-07-12 DIAGNOSIS — E119 Type 2 diabetes mellitus without complications: Secondary | ICD-10-CM | POA: Diagnosis not present

## 2016-07-12 DIAGNOSIS — I5040 Unspecified combined systolic (congestive) and diastolic (congestive) heart failure: Secondary | ICD-10-CM | POA: Diagnosis not present

## 2016-07-12 DIAGNOSIS — J441 Chronic obstructive pulmonary disease with (acute) exacerbation: Secondary | ICD-10-CM | POA: Diagnosis not present

## 2016-07-14 DIAGNOSIS — J449 Chronic obstructive pulmonary disease, unspecified: Secondary | ICD-10-CM | POA: Diagnosis not present

## 2016-07-18 DIAGNOSIS — Z9981 Dependence on supplemental oxygen: Secondary | ICD-10-CM | POA: Diagnosis not present

## 2016-07-18 DIAGNOSIS — I482 Chronic atrial fibrillation: Secondary | ICD-10-CM | POA: Diagnosis not present

## 2016-07-18 DIAGNOSIS — I5040 Unspecified combined systolic (congestive) and diastolic (congestive) heart failure: Secondary | ICD-10-CM | POA: Diagnosis not present

## 2016-07-18 DIAGNOSIS — C3491 Malignant neoplasm of unspecified part of right bronchus or lung: Secondary | ICD-10-CM | POA: Diagnosis not present

## 2016-07-18 DIAGNOSIS — E119 Type 2 diabetes mellitus without complications: Secondary | ICD-10-CM | POA: Diagnosis not present

## 2016-07-18 DIAGNOSIS — I110 Hypertensive heart disease with heart failure: Secondary | ICD-10-CM | POA: Diagnosis not present

## 2016-07-18 DIAGNOSIS — J441 Chronic obstructive pulmonary disease with (acute) exacerbation: Secondary | ICD-10-CM | POA: Diagnosis not present

## 2016-07-18 DIAGNOSIS — Z794 Long term (current) use of insulin: Secondary | ICD-10-CM | POA: Diagnosis not present

## 2016-07-19 DIAGNOSIS — D508 Other iron deficiency anemias: Secondary | ICD-10-CM | POA: Diagnosis not present

## 2016-07-20 DIAGNOSIS — J441 Chronic obstructive pulmonary disease with (acute) exacerbation: Secondary | ICD-10-CM | POA: Diagnosis not present

## 2016-07-20 DIAGNOSIS — J449 Chronic obstructive pulmonary disease, unspecified: Secondary | ICD-10-CM | POA: Diagnosis not present

## 2016-07-20 DIAGNOSIS — C3491 Malignant neoplasm of unspecified part of right bronchus or lung: Secondary | ICD-10-CM | POA: Diagnosis not present

## 2016-07-20 DIAGNOSIS — Z794 Long term (current) use of insulin: Secondary | ICD-10-CM | POA: Diagnosis not present

## 2016-07-20 DIAGNOSIS — I5040 Unspecified combined systolic (congestive) and diastolic (congestive) heart failure: Secondary | ICD-10-CM | POA: Diagnosis not present

## 2016-07-20 DIAGNOSIS — E119 Type 2 diabetes mellitus without complications: Secondary | ICD-10-CM | POA: Diagnosis not present

## 2016-07-20 DIAGNOSIS — I110 Hypertensive heart disease with heart failure: Secondary | ICD-10-CM | POA: Diagnosis not present

## 2016-07-20 DIAGNOSIS — Z9981 Dependence on supplemental oxygen: Secondary | ICD-10-CM | POA: Diagnosis not present

## 2016-07-20 DIAGNOSIS — J439 Emphysema, unspecified: Secondary | ICD-10-CM | POA: Diagnosis not present

## 2016-07-20 DIAGNOSIS — I482 Chronic atrial fibrillation: Secondary | ICD-10-CM | POA: Diagnosis not present

## 2016-07-21 DIAGNOSIS — I5040 Unspecified combined systolic (congestive) and diastolic (congestive) heart failure: Secondary | ICD-10-CM | POA: Diagnosis not present

## 2016-07-21 DIAGNOSIS — I110 Hypertensive heart disease with heart failure: Secondary | ICD-10-CM | POA: Diagnosis not present

## 2016-07-21 DIAGNOSIS — C3491 Malignant neoplasm of unspecified part of right bronchus or lung: Secondary | ICD-10-CM | POA: Diagnosis not present

## 2016-07-21 DIAGNOSIS — J441 Chronic obstructive pulmonary disease with (acute) exacerbation: Secondary | ICD-10-CM | POA: Diagnosis not present

## 2016-07-21 DIAGNOSIS — E119 Type 2 diabetes mellitus without complications: Secondary | ICD-10-CM | POA: Diagnosis not present

## 2016-07-21 DIAGNOSIS — Z9981 Dependence on supplemental oxygen: Secondary | ICD-10-CM | POA: Diagnosis not present

## 2016-07-21 DIAGNOSIS — Z794 Long term (current) use of insulin: Secondary | ICD-10-CM | POA: Diagnosis not present

## 2016-07-21 DIAGNOSIS — I482 Chronic atrial fibrillation: Secondary | ICD-10-CM | POA: Diagnosis not present

## 2016-07-23 DIAGNOSIS — I482 Chronic atrial fibrillation: Secondary | ICD-10-CM | POA: Diagnosis not present

## 2016-07-23 DIAGNOSIS — I5040 Unspecified combined systolic (congestive) and diastolic (congestive) heart failure: Secondary | ICD-10-CM | POA: Diagnosis not present

## 2016-07-23 DIAGNOSIS — J449 Chronic obstructive pulmonary disease, unspecified: Secondary | ICD-10-CM | POA: Diagnosis not present

## 2016-07-23 DIAGNOSIS — C3491 Malignant neoplasm of unspecified part of right bronchus or lung: Secondary | ICD-10-CM | POA: Diagnosis not present

## 2016-07-23 DIAGNOSIS — Z794 Long term (current) use of insulin: Secondary | ICD-10-CM | POA: Diagnosis not present

## 2016-07-23 DIAGNOSIS — R1310 Dysphagia, unspecified: Secondary | ICD-10-CM | POA: Diagnosis not present

## 2016-07-23 DIAGNOSIS — I110 Hypertensive heart disease with heart failure: Secondary | ICD-10-CM | POA: Diagnosis not present

## 2016-07-23 DIAGNOSIS — Z9981 Dependence on supplemental oxygen: Secondary | ICD-10-CM | POA: Diagnosis not present

## 2016-07-23 DIAGNOSIS — E119 Type 2 diabetes mellitus without complications: Secondary | ICD-10-CM | POA: Diagnosis not present

## 2016-07-23 DIAGNOSIS — R269 Unspecified abnormalities of gait and mobility: Secondary | ICD-10-CM | POA: Diagnosis not present

## 2016-07-27 DIAGNOSIS — J449 Chronic obstructive pulmonary disease, unspecified: Secondary | ICD-10-CM | POA: Diagnosis not present

## 2016-07-27 DIAGNOSIS — I482 Chronic atrial fibrillation: Secondary | ICD-10-CM | POA: Diagnosis not present

## 2016-07-27 DIAGNOSIS — I110 Hypertensive heart disease with heart failure: Secondary | ICD-10-CM | POA: Diagnosis not present

## 2016-07-27 DIAGNOSIS — I5040 Unspecified combined systolic (congestive) and diastolic (congestive) heart failure: Secondary | ICD-10-CM | POA: Diagnosis not present

## 2016-07-27 DIAGNOSIS — R269 Unspecified abnormalities of gait and mobility: Secondary | ICD-10-CM | POA: Diagnosis not present

## 2016-07-27 DIAGNOSIS — Z794 Long term (current) use of insulin: Secondary | ICD-10-CM | POA: Diagnosis not present

## 2016-07-27 DIAGNOSIS — E119 Type 2 diabetes mellitus without complications: Secondary | ICD-10-CM | POA: Diagnosis not present

## 2016-07-27 DIAGNOSIS — Z9981 Dependence on supplemental oxygen: Secondary | ICD-10-CM | POA: Diagnosis not present

## 2016-07-27 DIAGNOSIS — C3491 Malignant neoplasm of unspecified part of right bronchus or lung: Secondary | ICD-10-CM | POA: Diagnosis not present

## 2016-07-27 DIAGNOSIS — R1310 Dysphagia, unspecified: Secondary | ICD-10-CM | POA: Diagnosis not present

## 2016-07-30 DIAGNOSIS — I5040 Unspecified combined systolic (congestive) and diastolic (congestive) heart failure: Secondary | ICD-10-CM | POA: Diagnosis not present

## 2016-07-30 DIAGNOSIS — C3491 Malignant neoplasm of unspecified part of right bronchus or lung: Secondary | ICD-10-CM | POA: Diagnosis not present

## 2016-07-30 DIAGNOSIS — R269 Unspecified abnormalities of gait and mobility: Secondary | ICD-10-CM | POA: Diagnosis not present

## 2016-07-30 DIAGNOSIS — J449 Chronic obstructive pulmonary disease, unspecified: Secondary | ICD-10-CM | POA: Diagnosis not present

## 2016-07-30 DIAGNOSIS — Z9981 Dependence on supplemental oxygen: Secondary | ICD-10-CM | POA: Diagnosis not present

## 2016-07-30 DIAGNOSIS — R1310 Dysphagia, unspecified: Secondary | ICD-10-CM | POA: Diagnosis not present

## 2016-07-30 DIAGNOSIS — I110 Hypertensive heart disease with heart failure: Secondary | ICD-10-CM | POA: Diagnosis not present

## 2016-07-30 DIAGNOSIS — Z794 Long term (current) use of insulin: Secondary | ICD-10-CM | POA: Diagnosis not present

## 2016-07-30 DIAGNOSIS — E119 Type 2 diabetes mellitus without complications: Secondary | ICD-10-CM | POA: Diagnosis not present

## 2016-07-30 DIAGNOSIS — I482 Chronic atrial fibrillation: Secondary | ICD-10-CM | POA: Diagnosis not present

## 2016-07-31 DIAGNOSIS — I509 Heart failure, unspecified: Secondary | ICD-10-CM | POA: Diagnosis not present

## 2016-08-01 DIAGNOSIS — R1310 Dysphagia, unspecified: Secondary | ICD-10-CM | POA: Diagnosis not present

## 2016-08-01 DIAGNOSIS — I482 Chronic atrial fibrillation: Secondary | ICD-10-CM | POA: Diagnosis not present

## 2016-08-01 DIAGNOSIS — Z9981 Dependence on supplemental oxygen: Secondary | ICD-10-CM | POA: Diagnosis not present

## 2016-08-01 DIAGNOSIS — R269 Unspecified abnormalities of gait and mobility: Secondary | ICD-10-CM | POA: Diagnosis not present

## 2016-08-01 DIAGNOSIS — J449 Chronic obstructive pulmonary disease, unspecified: Secondary | ICD-10-CM | POA: Diagnosis not present

## 2016-08-01 DIAGNOSIS — I110 Hypertensive heart disease with heart failure: Secondary | ICD-10-CM | POA: Diagnosis not present

## 2016-08-01 DIAGNOSIS — Z794 Long term (current) use of insulin: Secondary | ICD-10-CM | POA: Diagnosis not present

## 2016-08-01 DIAGNOSIS — I5040 Unspecified combined systolic (congestive) and diastolic (congestive) heart failure: Secondary | ICD-10-CM | POA: Diagnosis not present

## 2016-08-01 DIAGNOSIS — C3491 Malignant neoplasm of unspecified part of right bronchus or lung: Secondary | ICD-10-CM | POA: Diagnosis not present

## 2016-08-01 DIAGNOSIS — E119 Type 2 diabetes mellitus without complications: Secondary | ICD-10-CM | POA: Diagnosis not present

## 2016-08-03 DIAGNOSIS — Z9981 Dependence on supplemental oxygen: Secondary | ICD-10-CM | POA: Diagnosis not present

## 2016-08-03 DIAGNOSIS — I5040 Unspecified combined systolic (congestive) and diastolic (congestive) heart failure: Secondary | ICD-10-CM | POA: Diagnosis not present

## 2016-08-03 DIAGNOSIS — I482 Chronic atrial fibrillation: Secondary | ICD-10-CM | POA: Diagnosis not present

## 2016-08-03 DIAGNOSIS — E119 Type 2 diabetes mellitus without complications: Secondary | ICD-10-CM | POA: Diagnosis not present

## 2016-08-03 DIAGNOSIS — I110 Hypertensive heart disease with heart failure: Secondary | ICD-10-CM | POA: Diagnosis not present

## 2016-08-03 DIAGNOSIS — C3491 Malignant neoplasm of unspecified part of right bronchus or lung: Secondary | ICD-10-CM | POA: Diagnosis not present

## 2016-08-03 DIAGNOSIS — R269 Unspecified abnormalities of gait and mobility: Secondary | ICD-10-CM | POA: Diagnosis not present

## 2016-08-03 DIAGNOSIS — J449 Chronic obstructive pulmonary disease, unspecified: Secondary | ICD-10-CM | POA: Diagnosis not present

## 2016-08-03 DIAGNOSIS — R1310 Dysphagia, unspecified: Secondary | ICD-10-CM | POA: Diagnosis not present

## 2016-08-03 DIAGNOSIS — Z794 Long term (current) use of insulin: Secondary | ICD-10-CM | POA: Diagnosis not present

## 2016-08-08 DIAGNOSIS — Z79899 Other long term (current) drug therapy: Secondary | ICD-10-CM | POA: Diagnosis not present

## 2016-08-08 DIAGNOSIS — Z87442 Personal history of urinary calculi: Secondary | ICD-10-CM | POA: Diagnosis not present

## 2016-08-08 DIAGNOSIS — Z7951 Long term (current) use of inhaled steroids: Secondary | ICD-10-CM | POA: Diagnosis not present

## 2016-08-08 DIAGNOSIS — R0602 Shortness of breath: Secondary | ICD-10-CM | POA: Diagnosis not present

## 2016-08-08 DIAGNOSIS — R42 Dizziness and giddiness: Secondary | ICD-10-CM | POA: Diagnosis not present

## 2016-08-08 DIAGNOSIS — Z836 Family history of other diseases of the respiratory system: Secondary | ICD-10-CM | POA: Diagnosis not present

## 2016-08-08 DIAGNOSIS — Z886 Allergy status to analgesic agent status: Secondary | ICD-10-CM | POA: Diagnosis not present

## 2016-08-08 DIAGNOSIS — I482 Chronic atrial fibrillation: Secondary | ICD-10-CM | POA: Diagnosis not present

## 2016-08-08 DIAGNOSIS — D6959 Other secondary thrombocytopenia: Secondary | ICD-10-CM | POA: Diagnosis not present

## 2016-08-08 DIAGNOSIS — K449 Diaphragmatic hernia without obstruction or gangrene: Secondary | ICD-10-CM | POA: Diagnosis not present

## 2016-08-08 DIAGNOSIS — Z801 Family history of malignant neoplasm of trachea, bronchus and lung: Secondary | ICD-10-CM | POA: Diagnosis not present

## 2016-08-08 DIAGNOSIS — C3490 Malignant neoplasm of unspecified part of unspecified bronchus or lung: Secondary | ICD-10-CM | POA: Diagnosis not present

## 2016-08-08 DIAGNOSIS — Z8249 Family history of ischemic heart disease and other diseases of the circulatory system: Secondary | ICD-10-CM | POA: Diagnosis not present

## 2016-08-08 DIAGNOSIS — J449 Chronic obstructive pulmonary disease, unspecified: Secondary | ICD-10-CM | POA: Diagnosis not present

## 2016-08-08 DIAGNOSIS — I110 Hypertensive heart disease with heart failure: Secondary | ICD-10-CM | POA: Diagnosis not present

## 2016-08-08 DIAGNOSIS — K7460 Unspecified cirrhosis of liver: Secondary | ICD-10-CM | POA: Diagnosis not present

## 2016-08-08 DIAGNOSIS — R531 Weakness: Secondary | ICD-10-CM | POA: Diagnosis not present

## 2016-08-08 DIAGNOSIS — C3481 Malignant neoplasm of overlapping sites of right bronchus and lung: Secondary | ICD-10-CM | POA: Diagnosis not present

## 2016-08-08 DIAGNOSIS — R0609 Other forms of dyspnea: Secondary | ICD-10-CM | POA: Diagnosis not present

## 2016-08-08 DIAGNOSIS — E119 Type 2 diabetes mellitus without complications: Secondary | ICD-10-CM | POA: Diagnosis not present

## 2016-08-08 DIAGNOSIS — D696 Thrombocytopenia, unspecified: Secondary | ICD-10-CM | POA: Diagnosis not present

## 2016-08-08 DIAGNOSIS — Z794 Long term (current) use of insulin: Secondary | ICD-10-CM | POA: Diagnosis not present

## 2016-08-08 DIAGNOSIS — Z7982 Long term (current) use of aspirin: Secondary | ICD-10-CM | POA: Diagnosis not present

## 2016-08-08 DIAGNOSIS — C3491 Malignant neoplasm of unspecified part of right bronchus or lung: Secondary | ICD-10-CM | POA: Diagnosis not present

## 2016-08-08 DIAGNOSIS — R269 Unspecified abnormalities of gait and mobility: Secondary | ICD-10-CM | POA: Diagnosis not present

## 2016-08-08 DIAGNOSIS — R1310 Dysphagia, unspecified: Secondary | ICD-10-CM | POA: Diagnosis not present

## 2016-08-08 DIAGNOSIS — K7469 Other cirrhosis of liver: Secondary | ICD-10-CM | POA: Diagnosis not present

## 2016-08-08 DIAGNOSIS — I5040 Unspecified combined systolic (congestive) and diastolic (congestive) heart failure: Secondary | ICD-10-CM | POA: Diagnosis not present

## 2016-08-08 DIAGNOSIS — Z87891 Personal history of nicotine dependence: Secondary | ICD-10-CM | POA: Diagnosis not present

## 2016-08-08 DIAGNOSIS — Z9981 Dependence on supplemental oxygen: Secondary | ICD-10-CM | POA: Diagnosis not present

## 2016-08-08 DIAGNOSIS — R0600 Dyspnea, unspecified: Secondary | ICD-10-CM | POA: Diagnosis not present

## 2016-08-08 DIAGNOSIS — R05 Cough: Secondary | ICD-10-CM | POA: Diagnosis not present

## 2016-08-09 DIAGNOSIS — D6959 Other secondary thrombocytopenia: Secondary | ICD-10-CM | POA: Diagnosis not present

## 2016-08-09 DIAGNOSIS — C3481 Malignant neoplasm of overlapping sites of right bronchus and lung: Secondary | ICD-10-CM | POA: Diagnosis not present

## 2016-08-09 DIAGNOSIS — C3411 Malignant neoplasm of upper lobe, right bronchus or lung: Secondary | ICD-10-CM | POA: Diagnosis not present

## 2016-08-09 DIAGNOSIS — R0609 Other forms of dyspnea: Secondary | ICD-10-CM | POA: Diagnosis not present

## 2016-08-09 DIAGNOSIS — K7469 Other cirrhosis of liver: Secondary | ICD-10-CM | POA: Diagnosis not present

## 2016-08-09 DIAGNOSIS — I482 Chronic atrial fibrillation: Secondary | ICD-10-CM | POA: Diagnosis not present

## 2016-08-09 DIAGNOSIS — Z510 Encounter for antineoplastic radiation therapy: Secondary | ICD-10-CM | POA: Diagnosis not present

## 2016-08-10 DIAGNOSIS — I208 Other forms of angina pectoris: Secondary | ICD-10-CM | POA: Diagnosis not present

## 2016-08-13 DIAGNOSIS — J449 Chronic obstructive pulmonary disease, unspecified: Secondary | ICD-10-CM | POA: Diagnosis not present

## 2016-08-14 ENCOUNTER — Other Ambulatory Visit (HOSPITAL_COMMUNITY): Payer: Self-pay | Admitting: Oncology

## 2016-08-14 ENCOUNTER — Telehealth (HOSPITAL_COMMUNITY): Payer: Self-pay | Admitting: *Deleted

## 2016-08-14 DIAGNOSIS — C3491 Malignant neoplasm of unspecified part of right bronchus or lung: Secondary | ICD-10-CM

## 2016-08-14 MED ORDER — OXYCODONE-ACETAMINOPHEN 5-325 MG PO TABS: 1.0000 | ORAL_TABLET | ORAL | 0 refills | Status: DC | PRN

## 2016-08-17 DIAGNOSIS — Z87891 Personal history of nicotine dependence: Secondary | ICD-10-CM | POA: Diagnosis not present

## 2016-08-17 DIAGNOSIS — I110 Hypertensive heart disease with heart failure: Secondary | ICD-10-CM | POA: Diagnosis not present

## 2016-08-17 DIAGNOSIS — I4892 Unspecified atrial flutter: Secondary | ICD-10-CM | POA: Diagnosis not present

## 2016-08-17 DIAGNOSIS — Z794 Long term (current) use of insulin: Secondary | ICD-10-CM | POA: Diagnosis not present

## 2016-08-17 DIAGNOSIS — Z79899 Other long term (current) drug therapy: Secondary | ICD-10-CM | POA: Diagnosis not present

## 2016-08-17 DIAGNOSIS — E119 Type 2 diabetes mellitus without complications: Secondary | ICD-10-CM | POA: Diagnosis not present

## 2016-08-17 DIAGNOSIS — I959 Hypotension, unspecified: Secondary | ICD-10-CM | POA: Diagnosis not present

## 2016-08-17 DIAGNOSIS — Z85118 Personal history of other malignant neoplasm of bronchus and lung: Secondary | ICD-10-CM | POA: Diagnosis not present

## 2016-08-17 DIAGNOSIS — J449 Chronic obstructive pulmonary disease, unspecified: Secondary | ICD-10-CM | POA: Diagnosis not present

## 2016-08-17 DIAGNOSIS — Z7951 Long term (current) use of inhaled steroids: Secondary | ICD-10-CM | POA: Diagnosis not present

## 2016-08-17 DIAGNOSIS — I509 Heart failure, unspecified: Secondary | ICD-10-CM | POA: Diagnosis not present

## 2016-08-17 DIAGNOSIS — R0602 Shortness of breath: Secondary | ICD-10-CM | POA: Diagnosis not present

## 2016-08-17 DIAGNOSIS — Z7982 Long term (current) use of aspirin: Secondary | ICD-10-CM | POA: Diagnosis not present

## 2016-08-20 DIAGNOSIS — I5040 Unspecified combined systolic (congestive) and diastolic (congestive) heart failure: Secondary | ICD-10-CM | POA: Diagnosis not present

## 2016-08-20 DIAGNOSIS — E119 Type 2 diabetes mellitus without complications: Secondary | ICD-10-CM | POA: Diagnosis not present

## 2016-08-20 DIAGNOSIS — R1310 Dysphagia, unspecified: Secondary | ICD-10-CM | POA: Diagnosis not present

## 2016-08-20 DIAGNOSIS — I482 Chronic atrial fibrillation: Secondary | ICD-10-CM | POA: Diagnosis not present

## 2016-08-20 DIAGNOSIS — C3491 Malignant neoplasm of unspecified part of right bronchus or lung: Secondary | ICD-10-CM | POA: Diagnosis not present

## 2016-08-20 DIAGNOSIS — I110 Hypertensive heart disease with heart failure: Secondary | ICD-10-CM | POA: Diagnosis not present

## 2016-08-20 DIAGNOSIS — J449 Chronic obstructive pulmonary disease, unspecified: Secondary | ICD-10-CM | POA: Diagnosis not present

## 2016-08-20 DIAGNOSIS — Z794 Long term (current) use of insulin: Secondary | ICD-10-CM | POA: Diagnosis not present

## 2016-08-20 DIAGNOSIS — R269 Unspecified abnormalities of gait and mobility: Secondary | ICD-10-CM | POA: Diagnosis not present

## 2016-08-20 DIAGNOSIS — Z9981 Dependence on supplemental oxygen: Secondary | ICD-10-CM | POA: Diagnosis not present

## 2016-08-21 ENCOUNTER — Encounter (HOSPITAL_COMMUNITY)
Admission: RE | Admit: 2016-08-21 | Discharge: 2016-08-21 | Disposition: A | Discharge status: Home / Self Care | Payer: Medicare Other | Source: Ambulatory Visit | Attending: Oncology | Admitting: Oncology

## 2016-08-21 DIAGNOSIS — J441 Chronic obstructive pulmonary disease with (acute) exacerbation: Secondary | ICD-10-CM | POA: Diagnosis not present

## 2016-08-21 DIAGNOSIS — C3490 Malignant neoplasm of unspecified part of unspecified bronchus or lung: Secondary | ICD-10-CM | POA: Diagnosis not present

## 2016-08-21 LAB — GLUCOSE, CAPILLARY: Glucose-Capillary: 150 mg/dL — ABNORMAL HIGH (ref 65–99)

## 2016-08-21 MED ORDER — FLUDEOXYGLUCOSE F - 18 (FDG) INJECTION
14.3500 | Freq: Once | INTRAVENOUS | Status: AC | PRN
  Administered 2016-08-21: 14.35 via INTRAVENOUS

## 2016-08-22 DIAGNOSIS — J439 Emphysema, unspecified: Secondary | ICD-10-CM | POA: Diagnosis not present

## 2016-08-23 ENCOUNTER — Encounter (HOSPITAL_COMMUNITY): Payer: Medicare Other

## 2016-08-23 ENCOUNTER — Encounter (HOSPITAL_COMMUNITY): Payer: Medicare Other | Attending: Oncology | Admitting: Oncology

## 2016-08-23 ENCOUNTER — Encounter (HOSPITAL_COMMUNITY): Payer: Self-pay

## 2016-08-23 VITALS — BP 172/90 | HR 94 | Temp 98.3°F | Resp 20 | Ht 73.0 in | Wt 278.0 lb

## 2016-08-23 DIAGNOSIS — I509 Heart failure, unspecified: Secondary | ICD-10-CM | POA: Diagnosis not present

## 2016-08-23 DIAGNOSIS — C3411 Malignant neoplasm of upper lobe, right bronchus or lung: Secondary | ICD-10-CM | POA: Diagnosis not present

## 2016-08-23 DIAGNOSIS — I110 Hypertensive heart disease with heart failure: Secondary | ICD-10-CM | POA: Diagnosis not present

## 2016-08-23 DIAGNOSIS — Z87891 Personal history of nicotine dependence: Secondary | ICD-10-CM | POA: Diagnosis not present

## 2016-08-23 DIAGNOSIS — Z7982 Long term (current) use of aspirin: Secondary | ICD-10-CM | POA: Insufficient documentation

## 2016-08-23 DIAGNOSIS — Z9889 Other specified postprocedural states: Secondary | ICD-10-CM | POA: Insufficient documentation

## 2016-08-23 DIAGNOSIS — J449 Chronic obstructive pulmonary disease, unspecified: Secondary | ICD-10-CM | POA: Insufficient documentation

## 2016-08-23 DIAGNOSIS — F1010 Alcohol abuse, uncomplicated: Secondary | ICD-10-CM | POA: Insufficient documentation

## 2016-08-23 DIAGNOSIS — C3491 Malignant neoplasm of unspecified part of right bronchus or lung: Secondary | ICD-10-CM | POA: Insufficient documentation

## 2016-08-23 DIAGNOSIS — C3490 Malignant neoplasm of unspecified part of unspecified bronchus or lung: Secondary | ICD-10-CM

## 2016-08-23 DIAGNOSIS — Z794 Long term (current) use of insulin: Secondary | ICD-10-CM | POA: Diagnosis not present

## 2016-08-23 DIAGNOSIS — I429 Cardiomyopathy, unspecified: Secondary | ICD-10-CM | POA: Insufficient documentation

## 2016-08-23 DIAGNOSIS — F329 Major depressive disorder, single episode, unspecified: Secondary | ICD-10-CM | POA: Insufficient documentation

## 2016-08-23 DIAGNOSIS — I4891 Unspecified atrial fibrillation: Secondary | ICD-10-CM | POA: Diagnosis not present

## 2016-08-23 DIAGNOSIS — E119 Type 2 diabetes mellitus without complications: Secondary | ICD-10-CM | POA: Insufficient documentation

## 2016-08-23 DIAGNOSIS — I4892 Unspecified atrial flutter: Secondary | ICD-10-CM | POA: Insufficient documentation

## 2016-08-23 DIAGNOSIS — J441 Chronic obstructive pulmonary disease with (acute) exacerbation: Secondary | ICD-10-CM | POA: Diagnosis not present

## 2016-08-23 DIAGNOSIS — Z7951 Long term (current) use of inhaled steroids: Secondary | ICD-10-CM | POA: Diagnosis not present

## 2016-08-23 DIAGNOSIS — Z79899 Other long term (current) drug therapy: Secondary | ICD-10-CM | POA: Diagnosis not present

## 2016-08-23 DIAGNOSIS — N400 Enlarged prostate without lower urinary tract symptoms: Secondary | ICD-10-CM | POA: Diagnosis not present

## 2016-08-23 DIAGNOSIS — I480 Paroxysmal atrial fibrillation: Secondary | ICD-10-CM | POA: Insufficient documentation

## 2016-08-23 DIAGNOSIS — Z85118 Personal history of other malignant neoplasm of bronchus and lung: Secondary | ICD-10-CM | POA: Diagnosis not present

## 2016-08-23 MED ORDER — HEPARIN SOD (PORK) LOCK FLUSH 100 UNIT/ML IV SOLN
INTRAVENOUS | Status: AC
  Filled 2016-08-23: qty 5

## 2016-08-23 NOTE — Progress Notes (Signed)
[place]  Follow Up Progress Note  Patient Care Team: Neale Burly, MD as PCP - General (Internal Medicine)  CHIEF COMPLAINTS/PURPOSE OF CONSULTATION:  Small cell carcinoma of RUL  HISTORY OF PRESENTING ILLNESS:     Small cell lung cancer (Chase Herrera)   04/03/2016 Initial Diagnosis    Small cell lung cancer (Chase Herrera)     04/09/2016 Imaging    MRI brain- Negative for metastatic disease to the brain. No acute abnormality.      04/09/2016 Procedure    Right IJ port catheter placement by IR      04/13/2016 PET scan    . The right apical lung mass has significantly enlarged and there is new right paratracheal adenopathy as well as a hypermetabolic new lymph node in the right inner infraclavicular fossa potentially with early impingement on adjacent neurovascular structures. 2. Other imaging findings of potential clinical significance: Chronic bilateral maxillary sinusitis. Left carotid atherosclerotic calcification. Aortoiliac atherosclerotic vascular disease. Coronary atherosclerosis. Paraseptal emphysema. Mild atelectasis in the lung bases. Scattered sigmoid colon diverticula. Probable right pars defect at L5.      05/09/2016 - 07/12/2016 Radiation Therapy    Radiotherapy to right Lung+MS 63 Gy/35 fractions at 1.8 GY/f using 6 x photons 3- D CRT technique Dates: 4/18-6/21/18          06/19/2016 Treatment Plan Change    Cisplatin dose reduced by 20% for cycle #4 due to severe thrombocytopenia following cycle #3 (13,000).      08/21/2016 PET scan    1. Marked reduction in size and metabolic activity of RIGHT upper lobe mass. 2. Resolution of mediastinal and RIGHT supraclavicular lymphadenopathy. 3. No evidence of disease progression.       Chase Herrera 66 y.o. male previous smoker with a history of severe COPD on 2L O2, PAF (on Xarelto), and DM who is here because of a RUL mass. The patient was evaluated by Chase. Lianne Moris in CT surgery, who noticed a RUL  mass. A CT of the chest was done and showed a 4 cm hypermetabolic (SUV 82.8) RUL mass that was invading rib #1 and rib #2 posterolaterally on PET. On 03/22/16 patient underwent a bronchoscopy with right VATS and biopsy of RUL mass. Surgical path from biopsy showed high grade neuroendocrine carcinoma, most consistent with small cell carcinoma.  Treatment consisted of 4 cycles of chemo with cisplatin +etoposise 04/16/16- 06/21/16 along with concurrent RT which he completed on 07/12/16.  The patient presents today for  follow up. He states that he has been doing a lot better. His energy level has improved. Patient is doing physical therapy and is a lot more independent and mobile than before. His appetite is good. He has some exertional shortness of breath. He denies any chest pain, abdominal pain, cough, focal weakness.   MEDICAL HISTORY:  Past Medical History:  Diagnosis Date  . Alcohol abuse    Quit 08/23/11  . Arthritis   . Atrial fibrillation (Chase Herrera)   . Atrial flutter Chase Herrera)    s/p CTI ablation by Chase Herrera 10/13  . Cancer (Chase Herrera)    lung cancer  . Cardiomyopathy (Chase Herrera)    LVEF 25-30% 8/13, previously normal by dobutamine echo 5/13  . Chest pain   . CHF (congestive heart failure) (Chase Herrera)   . COPD (chronic obstructive pulmonary disease) (Chase Herrera)   . Depression   . Dyspnea   . Essential hypertension, benign   . Obesity   . Prostate enlargement   .  Type 2 diabetes mellitus (Chase Herrera)     SURGICAL HISTORY: Past Surgical History:  Procedure Laterality Date  . APPENDECTOMY    . ATRIAL ABLATION SURGERY  11/06/2011   CTI ablation for atrial flutter by Chase Herrera  . ATRIAL FLUTTER ABLATION N/A 11/06/2011   Procedure: ATRIAL FLUTTER ABLATION;  Surgeon: Thompson Grayer, MD;  Location[facility] CATH LAB;  Service: Cardiovascular;  Laterality: N/A;  . Cataracts    . IR GENERIC HISTORICAL  04/09/2016   IR US GUIDE VASC ACCESS RIGHT 04/09/2016 [name], DO [facility]  . IR GENERIC HISTORICAL  04/09/2016   IR FLUORO  GUIDE PORT INSERTION RIGHT 04/09/2016 [name], DO [facility]  . TONSILLECTOMY      SOCIAL HISTORY: Social History   Social History  . Marital status: Single    Spouse name: N/A  . Number of children: N/A  . Years of education: N/A   Occupational History  . retired      Building services engineer   Social History Main Topics  . Smoking status: Former Smoker    Packs/day: 0.50    Years: 47.00    Types: Cigarettes  . Smokeless tobacco: Former Systems developer    Quit date: 04/10/2014     Comment: encouraged to quit today 03/04/12  . Alcohol use Yes     Comment: quit 2 years ago  . Drug use: No     Comment: marajuana occasionally  . Sexual activity: Not Currently   Other Topics Concern  . Not on file   Social History Narrative   Lives in [place] alone.   Disabled due to COPD   Previousliy worked as a Electrical engineer    FAMILY HISTORY: Family History  Problem Relation Age of Onset  . Diabetes Sister   . Hypertension Mother   . Cancer Mother        lung  . Hypertension Father   . Heart attack Father   . Hypertension Sister   . Hypertension Sister   . Hypertension Sister   . Pulmonary fibrosis Sister     ALLERGIES:  is allergic to codeine.  MEDICATIONS:  Current Outpatient Prescriptions  Medication Sig Dispense Refill  . albuterol (PROVENTIL HFA;VENTOLIN HFA) 108 (90 BASE) MCG/ACT inhaler Inhale 2 puffs into the lungs every 6 (six) hours as needed. For shortness of breath    . aspirin EC 81 MG tablet Take 81 mg by mouth daily.    . carvedilol (COREG) 25 MG tablet Take 25 mg by mouth 2 (two) times daily with a meal.    . diltiazem (CARDIZEM CD) 240 MG 24 hr capsule Take 240 mg by mouth.    . furosemide (LASIX) 40 MG tablet Take 40 mg by mouth daily.    Marland Kitchen gabapentin (NEURONTIN) 300 MG capsule Take by mouth.    . insulin glargine (LANTUS) 100 UNIT/ML injection Inject 25 Units into the skin 2 (two) times daily.    . insulin lispro (HUMALOG) 100 UNIT/ML injection Inject  into the skin. SSI    . ipratropium-albuterol (DUONEB) 0.5-2.5 (3) MG/3ML SOLN Take 3 mLs by nebulization every 6 (six) hours as needed (shortness of breath/wheezing).    . LORazepam (ATIVAN) 0.5 MG tablet Take 1 tablet (0.5 mg total) by mouth every 6 (six) hours as needed (Nausea or vomiting). 30 tablet 0  . metformin (FORTAMET) 500 MG (OSM) 24 hr tablet Take 1,000 mg by mouth.    . Multiple Vitamin (MULTIVITAMIN) capsule Take by mouth.    Marland Kitchen  ondansetron (ZOFRAN) 8 MG tablet Take 1 tablet (8 mg total) by mouth 2 (two) times daily as needed. Start on the third day after cisplatin chemotherapy. (Patient taking differently: Take 8 mg by mouth 2 (two) times daily as needed. Start on the third day after cisplatin chemotherapy. # 1 med to take for nausea) 30 tablet 1  . oxyCODONE-acetaminophen (PERCOCET/ROXICET) 5-325 MG tablet Take 1-2 tablets by mouth every 4 (four) hours as needed for severe pain. 90 tablet 0  . prochlorperazine (COMPAZINE) 10 MG tablet Take 1 tablet (10 mg total) by mouth every 6 (six) hours as needed (Nausea or vomiting). (Patient taking differently: Take 10 mg by mouth every 6 (six) hours as needed (Nausea or vomiting). #2 to take for nausea) 30 tablet 1  . tamsulosin (FLOMAX) 0.4 MG CAPS capsule Take by mouth.    . Rivaroxaban (XARELTO) 15 MG TABS tablet Take 15 mg by mouth.    . sucralfate (CARAFATE) 1 GM/10ML suspension Take by mouth.     No current facility-administered medications for this visit.    Review of Systems  Constitutional: Positive for malaise/fatigue.       Appetite is good  HENT: Negative.   Eyes: Negative.   Respiratory: Positive for shortness of breath.        COPD  Gastrointestinal: Negative.   Genitourinary: Negative.   Musculoskeletal: Negative.   Skin: Negative.   Neurological: Negative for weakness.  Endo/Heme/Allergies: Negative.   Psychiatric/Behavioral: Negative.   All other systems reviewed and are negative. 14 point ROS was done and is  otherwise as detailed above or in HPI  PHYSICAL EXAMINATION: ECOG PERFORMANCE STATUS: 2 - Symptomatic, <50% confined to bed  Vitals:   08/23/16 0949  BP: (!) 172/90  Pulse: 94  Resp: 20  Temp: 98.3 F (36.8 C)   Filed Weights   08/23/16 0949  Weight: 278 lb (126.1 kg)      Physical Exam  Constitutional: He is oriented to person, place, and time and well-developed, well-nourished, and in no distress.  HENT:  Head: Normocephalic and atraumatic.  Mouth/Throat: Oropharynx is clear and moist.  Eyes: Pupils are equal, round, and reactive to light. Conjunctivae and EOM are normal.  Neck: Normal range of motion. Neck supple.  Cardiovascular: Normal rate, regular rhythm and normal heart sounds.   Pulmonary/Chest: Effort normal and breath sounds normal. No respiratory distress.  Abdominal: Soft. Bowel sounds are normal.  Musculoskeletal: Normal range of motion.  Neurological: He is alert and oriented to person, place, and time. Gait normal.  Skin: Skin is warm and dry.  Nursing note and vitals reviewed.  LABORATORY DATA:  I have reviewed the data as listed. Lab Results  Component Value Date   WBC 7.7 06/20/2016   HGB 9.4 (L) 06/20/2016   HCT 28.8 (L) 06/20/2016   MCV 100.0 06/20/2016   PLT 237 06/20/2016   CMP     Component Value Date/Time   NA 138 06/19/2016 1133   K 3.8 06/19/2016 1133   CL 100 (L) 06/19/2016 1133   CO2 30 06/19/2016 1133   GLUCOSE 147 (H) 06/19/2016 1133   BUN 14 06/19/2016 1133   CREATININE 1.30 (H) 06/19/2016 1133   CALCIUM 8.1 (L) 06/19/2016 1133   PROT 5.5 (L) 06/19/2016 1133   ALBUMIN 3.2 (L) 06/19/2016 1133   AST 22 06/19/2016 1133   ALT 21 06/19/2016 1133   ALKPHOS 51 06/19/2016 1133   BILITOT 0.4 06/19/2016 1133   GFRNONAA 57 (L) 06/19/2016 1133  GFRAA >60 06/19/2016 1133   PATHOLOGY:  Outside report 03/22/2016 Diagnosis: "RIGHT UPPER LOBE MASS"; BIOPSY: High grade neuroendocrine carcinoma, most consistent with small cell  carcinoma.  Note: the tumor cells are positive for CK7, TTF-1 (diffuse and strong), synaptophysin (diffuse and strong), chromogranin (focal and weak) while negative for Napsin A, CK20, CDX2 and p63 supporting the diagnosis.  A Ki67 stain shows a proliferative rate of 40%  RADIOGRAPHIC STUDIES: I have personally reviewed the radiological images as listed and agreed with the findings in the report.  PET scan 12/22/2015 IMPRESSION: 1. Hypermetabolic RIGHT upper lobe mass abutting pleural surface of the RIGHT lung apex consistent with primary bronchogenic carcinoma. 2. No evidence of metastatic disease.  T2a N0 M0 . 3. Paraseptal emphysema.  Outside Imaging 12/09/2015 Chest CT IMPRESSION: 4 cm spiculated right apical mass, adjacent to 2nd rib. No mediastinal adenopathy.   MRI HEAD WITHOUT AND WITH CONTRAST 04/09/2016  IMPRESSION: Negative for metastatic disease to the brain.  No acute abnormality.  NUCLEAR MEDICINE PET SKULL BASE TO THIGH 04/13/2016   IMPRESSION: 1. The right apical lung mass has significantly enlarged and there is new right paratracheal adenopathy as well as a hypermetabolic new lymph node in the right inner infraclavicular fossa potentially with early impingement on adjacent neurovascular structures. 2. Other imaging findings of potential clinical significance: Chronic bilateral maxillary sinusitis. Left carotid atherosclerotic calcification. Aortoiliac atherosclerotic vascular disease. Coronary atherosclerosis. Paraseptal emphysema. Mild atelectasis in the lung bases. Scattered sigmoid colon diverticula. Probable right pars defect at L5.   PET 08/21/16:  Study Result   CLINICAL DATA:  Subsequent treatment strategy for small cell lung cancer.  EXAM: NUCLEAR MEDICINE PET SKULL BASE TO THIGH  TECHNIQUE: 14.4 mCi F-18 FDG was injected intravenously. Full-ring PET imaging was performed from the skull base to thigh after the radiotracer. CT data was  obtained and used for attenuation correction and anatomic localization.  FASTING BLOOD GLUCOSE:  Value: 150 mg/dl  COMPARISON:  PET-CT scan 04/13/2016  FINDINGS: NECK  No hypermetabolic lymph nodes in the neck.  CHEST  Marked reduction in size and metabolic activity of RIGHT upper lobe mass. Mass measures 3.0 cm decreased from 6.0 cm with SUV max equal 4.6 decreased from 10.8.  Resolution of hypermetabolic RIGHT lower paratracheal lymph node with no remaining pathologic enlarged lymph node.  Additional resolution of RIGHT infraclavicular lymph node.  No new metastatic adenopathy in the mediastinum or supraclavicular locations.  No new pulmonary nodules.  ABDOMEN/PELVIS  No hypermetabolic activity in the liver or pancreas. Adrenal glands are normal. No hypermetabolic abdominopelvic lymph nodes.  Atherosclerotic calcification of aorta.  SKELETON  No focal hypermetabolic activity to suggest skeletal metastasis.  IMPRESSION: 1. Marked reduction in size and metabolic activity of RIGHT upper lobe mass. 2. Resolution of mediastinal and RIGHT supraclavicular lymphadenopathy. 3. No evidence of disease progression.   Electronically Signed   By: Suzy Bouchard M.D.   On: 08/21/2016 16:41    ASSESSMENT & PLAN:  Small cell carcinoma of RUL  Cancer Staging Small cell lung cancer ([place]) Staging form: Lung, AJCC 8th Edition - Clinical: Stage IIIC (cT3, cN3, cM0) - Signed by Twana First, MD on 04/16/2016  Treatment consisted of 4 cycles of chemo with cisplatin +etoposise 04/16/16- 06/21/16 along with concurrent RT which he completed on 07/12/16.  PLAN: I have reviewed patient's PET scan results in detail with him. He has had a great response to chemotherapy RT. Follow-up with radiation oncology regarding a prophylactic PCI. Patient states that they're  planning to schedule him a MRI of the brain with and without contrast. Return to clinic in 3 months for  follow-up with restaging CT scans and labs.  Orders Placed This Encounter  Procedures  . CT Abdomen Pelvis W Contrast    Standing Status:   Future    Standing Expiration Date:   08/23/2017    Order Specific Question:   If indicated for the ordered procedure, I authorize the administration of contrast media per Radiology protocol    Answer:   Yes    Order Specific Question:   Preferred imaging location?    Answer:   [facility]    Order Specific Question:   Radiology Contrast Protocol - do NOT remove file path    Answer:   \\[email or web address]  . CT Chest W Contrast    Standing Status:   Future    Standing Expiration Date:   08/23/2017    Order Specific Question:   If indicated for the ordered procedure, I authorize the administration of contrast media per Radiology protocol    Answer:   Yes    Order Specific Question:   Preferred imaging location?    Answer:   [facility]    Order Specific Question:   Radiology Contrast Protocol - do NOT remove file path    Answer:   [email or web address]    Order Specific Question:   Reason for Exam additional comments    Answer:   restaging scans  . Comprehensive metabolic panel    Standing Status:   Future    Standing Expiration Date:   08/23/2017  . CBC with Differential    Standing Status:   Future    Standing Expiration Date:   08/23/2017     All questions were answered. The patient knows to call the clinic with any problems, questions or concerns.  This note was electronically signed.    Twana First, MD 08/23/2016 9:56 AM

## 2016-08-23 NOTE — Progress Notes (Signed)
Pt did not remain in clinic after MD appt today for portacath flush with lab draw. Dr Talbert Cage informed of this and pt approved to skip port flush with lab draw today and keep future appts for this to be done.

## 2016-08-24 DIAGNOSIS — R0602 Shortness of breath: Secondary | ICD-10-CM | POA: Diagnosis not present

## 2016-08-24 DIAGNOSIS — R109 Unspecified abdominal pain: Secondary | ICD-10-CM | POA: Diagnosis not present

## 2016-08-24 DIAGNOSIS — Z87891 Personal history of nicotine dependence: Secondary | ICD-10-CM | POA: Diagnosis not present

## 2016-08-24 DIAGNOSIS — E119 Type 2 diabetes mellitus without complications: Secondary | ICD-10-CM | POA: Diagnosis not present

## 2016-08-24 DIAGNOSIS — Z9221 Personal history of antineoplastic chemotherapy: Secondary | ICD-10-CM | POA: Diagnosis not present

## 2016-08-24 DIAGNOSIS — Z923 Personal history of irradiation: Secondary | ICD-10-CM | POA: Diagnosis not present

## 2016-08-24 DIAGNOSIS — R140 Abdominal distension (gaseous): Secondary | ICD-10-CM | POA: Diagnosis not present

## 2016-08-24 DIAGNOSIS — Z95828 Presence of other vascular implants and grafts: Secondary | ICD-10-CM | POA: Diagnosis not present

## 2016-08-24 DIAGNOSIS — Z794 Long term (current) use of insulin: Secondary | ICD-10-CM | POA: Diagnosis not present

## 2016-08-24 DIAGNOSIS — Z85118 Personal history of other malignant neoplasm of bronchus and lung: Secondary | ICD-10-CM | POA: Diagnosis not present

## 2016-08-24 DIAGNOSIS — I509 Heart failure, unspecified: Secondary | ICD-10-CM | POA: Diagnosis not present

## 2016-08-24 DIAGNOSIS — Z79899 Other long term (current) drug therapy: Secondary | ICD-10-CM | POA: Diagnosis not present

## 2016-08-24 DIAGNOSIS — Z7982 Long term (current) use of aspirin: Secondary | ICD-10-CM | POA: Diagnosis not present

## 2016-08-24 DIAGNOSIS — I110 Hypertensive heart disease with heart failure: Secondary | ICD-10-CM | POA: Diagnosis not present

## 2016-08-24 DIAGNOSIS — R1084 Generalized abdominal pain: Secondary | ICD-10-CM | POA: Diagnosis not present

## 2016-08-31 DIAGNOSIS — I509 Heart failure, unspecified: Secondary | ICD-10-CM | POA: Diagnosis not present

## 2016-09-02 DIAGNOSIS — I509 Heart failure, unspecified: Secondary | ICD-10-CM | POA: Diagnosis not present

## 2016-09-02 DIAGNOSIS — T5991XA Toxic effect of unspecified gases, fumes and vapors, accidental (unintentional), initial encounter: Secondary | ICD-10-CM | POA: Diagnosis not present

## 2016-09-02 DIAGNOSIS — J9801 Acute bronchospasm: Secondary | ICD-10-CM | POA: Diagnosis not present

## 2016-09-02 DIAGNOSIS — E119 Type 2 diabetes mellitus without complications: Secondary | ICD-10-CM | POA: Diagnosis not present

## 2016-09-02 DIAGNOSIS — Z9981 Dependence on supplemental oxygen: Secondary | ICD-10-CM | POA: Diagnosis not present

## 2016-09-02 DIAGNOSIS — I110 Hypertensive heart disease with heart failure: Secondary | ICD-10-CM | POA: Diagnosis not present

## 2016-09-02 DIAGNOSIS — Z87891 Personal history of nicotine dependence: Secondary | ICD-10-CM | POA: Diagnosis not present

## 2016-09-02 DIAGNOSIS — J680 Bronchitis and pneumonitis due to chemicals, gases, fumes and vapors: Secondary | ICD-10-CM | POA: Diagnosis not present

## 2016-09-02 DIAGNOSIS — J449 Chronic obstructive pulmonary disease, unspecified: Secondary | ICD-10-CM | POA: Diagnosis not present

## 2016-09-02 DIAGNOSIS — R0602 Shortness of breath: Secondary | ICD-10-CM | POA: Diagnosis not present

## 2016-09-02 DIAGNOSIS — Z794 Long term (current) use of insulin: Secondary | ICD-10-CM | POA: Diagnosis not present

## 2016-09-02 DIAGNOSIS — Z7982 Long term (current) use of aspirin: Secondary | ICD-10-CM | POA: Diagnosis not present

## 2016-09-02 DIAGNOSIS — Z79899 Other long term (current) drug therapy: Secondary | ICD-10-CM | POA: Diagnosis not present

## 2016-09-02 DIAGNOSIS — Z7951 Long term (current) use of inhaled steroids: Secondary | ICD-10-CM | POA: Diagnosis not present

## 2016-09-02 DIAGNOSIS — I4891 Unspecified atrial fibrillation: Secondary | ICD-10-CM | POA: Diagnosis not present

## 2016-09-02 DIAGNOSIS — Z85118 Personal history of other malignant neoplasm of bronchus and lung: Secondary | ICD-10-CM | POA: Diagnosis not present

## 2016-09-03 DIAGNOSIS — R0602 Shortness of breath: Secondary | ICD-10-CM | POA: Diagnosis not present

## 2016-09-05 DIAGNOSIS — E119 Type 2 diabetes mellitus without complications: Secondary | ICD-10-CM | POA: Diagnosis not present

## 2016-09-05 DIAGNOSIS — J441 Chronic obstructive pulmonary disease with (acute) exacerbation: Secondary | ICD-10-CM | POA: Diagnosis not present

## 2016-09-05 DIAGNOSIS — Z79899 Other long term (current) drug therapy: Secondary | ICD-10-CM | POA: Diagnosis not present

## 2016-09-05 DIAGNOSIS — R791 Abnormal coagulation profile: Secondary | ICD-10-CM | POA: Diagnosis not present

## 2016-09-05 DIAGNOSIS — I4891 Unspecified atrial fibrillation: Secondary | ICD-10-CM | POA: Diagnosis not present

## 2016-09-05 DIAGNOSIS — C3412 Malignant neoplasm of upper lobe, left bronchus or lung: Secondary | ICD-10-CM | POA: Diagnosis not present

## 2016-09-05 DIAGNOSIS — I509 Heart failure, unspecified: Secondary | ICD-10-CM | POA: Diagnosis not present

## 2016-09-05 DIAGNOSIS — I110 Hypertensive heart disease with heart failure: Secondary | ICD-10-CM | POA: Diagnosis not present

## 2016-09-05 DIAGNOSIS — J449 Chronic obstructive pulmonary disease, unspecified: Secondary | ICD-10-CM | POA: Diagnosis not present

## 2016-09-05 DIAGNOSIS — Z87891 Personal history of nicotine dependence: Secondary | ICD-10-CM | POA: Diagnosis not present

## 2016-09-05 DIAGNOSIS — R0902 Hypoxemia: Secondary | ICD-10-CM | POA: Diagnosis not present

## 2016-09-05 DIAGNOSIS — Z923 Personal history of irradiation: Secondary | ICD-10-CM | POA: Diagnosis not present

## 2016-09-05 DIAGNOSIS — Z7982 Long term (current) use of aspirin: Secondary | ICD-10-CM | POA: Diagnosis not present

## 2016-09-05 DIAGNOSIS — R079 Chest pain, unspecified: Secondary | ICD-10-CM | POA: Diagnosis not present

## 2016-09-05 DIAGNOSIS — Z794 Long term (current) use of insulin: Secondary | ICD-10-CM | POA: Diagnosis not present

## 2016-09-06 DIAGNOSIS — E1142 Type 2 diabetes mellitus with diabetic polyneuropathy: Secondary | ICD-10-CM | POA: Diagnosis not present

## 2016-09-06 DIAGNOSIS — I10 Essential (primary) hypertension: Secondary | ICD-10-CM | POA: Diagnosis not present

## 2016-09-06 DIAGNOSIS — J440 Chronic obstructive pulmonary disease with acute lower respiratory infection: Secondary | ICD-10-CM | POA: Diagnosis not present

## 2016-09-06 DIAGNOSIS — I208 Other forms of angina pectoris: Secondary | ICD-10-CM | POA: Diagnosis not present

## 2016-09-10 DIAGNOSIS — Z79899 Other long term (current) drug therapy: Secondary | ICD-10-CM | POA: Diagnosis not present

## 2016-09-10 DIAGNOSIS — Z794 Long term (current) use of insulin: Secondary | ICD-10-CM | POA: Diagnosis not present

## 2016-09-10 DIAGNOSIS — J449 Chronic obstructive pulmonary disease, unspecified: Secondary | ICD-10-CM | POA: Diagnosis not present

## 2016-09-10 DIAGNOSIS — R072 Precordial pain: Secondary | ICD-10-CM | POA: Diagnosis not present

## 2016-09-10 DIAGNOSIS — I509 Heart failure, unspecified: Secondary | ICD-10-CM | POA: Diagnosis not present

## 2016-09-10 DIAGNOSIS — I4891 Unspecified atrial fibrillation: Secondary | ICD-10-CM | POA: Diagnosis not present

## 2016-09-10 DIAGNOSIS — Z85118 Personal history of other malignant neoplasm of bronchus and lung: Secondary | ICD-10-CM | POA: Diagnosis not present

## 2016-09-10 DIAGNOSIS — E119 Type 2 diabetes mellitus without complications: Secondary | ICD-10-CM | POA: Diagnosis not present

## 2016-09-10 DIAGNOSIS — I110 Hypertensive heart disease with heart failure: Secondary | ICD-10-CM | POA: Diagnosis not present

## 2016-09-10 DIAGNOSIS — Z7982 Long term (current) use of aspirin: Secondary | ICD-10-CM | POA: Diagnosis not present

## 2016-09-10 DIAGNOSIS — Z87891 Personal history of nicotine dependence: Secondary | ICD-10-CM | POA: Diagnosis not present

## 2016-09-10 DIAGNOSIS — R0602 Shortness of breath: Secondary | ICD-10-CM | POA: Diagnosis not present

## 2016-09-10 DIAGNOSIS — J441 Chronic obstructive pulmonary disease with (acute) exacerbation: Secondary | ICD-10-CM | POA: Diagnosis not present

## 2016-09-13 DIAGNOSIS — C3411 Malignant neoplasm of upper lobe, right bronchus or lung: Secondary | ICD-10-CM | POA: Diagnosis not present

## 2016-09-13 DIAGNOSIS — I5040 Unspecified combined systolic (congestive) and diastolic (congestive) heart failure: Secondary | ICD-10-CM | POA: Diagnosis not present

## 2016-09-13 DIAGNOSIS — I482 Chronic atrial fibrillation: Secondary | ICD-10-CM | POA: Diagnosis not present

## 2016-09-13 DIAGNOSIS — Z886 Allergy status to analgesic agent status: Secondary | ICD-10-CM | POA: Diagnosis not present

## 2016-09-13 DIAGNOSIS — J449 Chronic obstructive pulmonary disease, unspecified: Secondary | ICD-10-CM | POA: Diagnosis not present

## 2016-09-13 DIAGNOSIS — Z87891 Personal history of nicotine dependence: Secondary | ICD-10-CM | POA: Diagnosis not present

## 2016-09-13 DIAGNOSIS — Z8249 Family history of ischemic heart disease and other diseases of the circulatory system: Secondary | ICD-10-CM | POA: Diagnosis not present

## 2016-09-13 DIAGNOSIS — K7460 Unspecified cirrhosis of liver: Secondary | ICD-10-CM | POA: Diagnosis not present

## 2016-09-13 DIAGNOSIS — R42 Dizziness and giddiness: Secondary | ICD-10-CM | POA: Diagnosis not present

## 2016-09-13 DIAGNOSIS — R0602 Shortness of breath: Secondary | ICD-10-CM | POA: Diagnosis not present

## 2016-09-13 DIAGNOSIS — Z9221 Personal history of antineoplastic chemotherapy: Secondary | ICD-10-CM | POA: Diagnosis not present

## 2016-09-13 DIAGNOSIS — Z85118 Personal history of other malignant neoplasm of bronchus and lung: Secondary | ICD-10-CM | POA: Diagnosis not present

## 2016-09-13 DIAGNOSIS — Z923 Personal history of irradiation: Secondary | ICD-10-CM | POA: Diagnosis not present

## 2016-09-13 DIAGNOSIS — I952 Hypotension due to drugs: Secondary | ICD-10-CM | POA: Diagnosis not present

## 2016-09-13 DIAGNOSIS — K7469 Other cirrhosis of liver: Secondary | ICD-10-CM | POA: Diagnosis not present

## 2016-09-13 DIAGNOSIS — G459 Transient cerebral ischemic attack, unspecified: Secondary | ICD-10-CM | POA: Diagnosis not present

## 2016-09-13 DIAGNOSIS — T447X1A Poisoning by beta-adrenoreceptor antagonists, accidental (unintentional), initial encounter: Secondary | ICD-10-CM | POA: Diagnosis not present

## 2016-09-13 DIAGNOSIS — I959 Hypotension, unspecified: Secondary | ICD-10-CM | POA: Diagnosis not present

## 2016-09-13 DIAGNOSIS — I110 Hypertensive heart disease with heart failure: Secondary | ICD-10-CM | POA: Diagnosis not present

## 2016-09-13 DIAGNOSIS — T887XXA Unspecified adverse effect of drug or medicament, initial encounter: Secondary | ICD-10-CM | POA: Diagnosis not present

## 2016-09-13 DIAGNOSIS — C3490 Malignant neoplasm of unspecified part of unspecified bronchus or lung: Secondary | ICD-10-CM | POA: Diagnosis not present

## 2016-09-13 DIAGNOSIS — R008 Other abnormalities of heart beat: Secondary | ICD-10-CM | POA: Diagnosis not present

## 2016-09-13 DIAGNOSIS — Z7951 Long term (current) use of inhaled steroids: Secondary | ICD-10-CM | POA: Diagnosis not present

## 2016-09-13 DIAGNOSIS — K449 Diaphragmatic hernia without obstruction or gangrene: Secondary | ICD-10-CM | POA: Diagnosis not present

## 2016-09-13 DIAGNOSIS — R001 Bradycardia, unspecified: Secondary | ICD-10-CM | POA: Diagnosis not present

## 2016-09-13 DIAGNOSIS — Z7982 Long term (current) use of aspirin: Secondary | ICD-10-CM | POA: Diagnosis not present

## 2016-09-13 DIAGNOSIS — Z79899 Other long term (current) drug therapy: Secondary | ICD-10-CM | POA: Diagnosis not present

## 2016-09-13 DIAGNOSIS — D696 Thrombocytopenia, unspecified: Secondary | ICD-10-CM | POA: Diagnosis not present

## 2016-09-13 DIAGNOSIS — Z794 Long term (current) use of insulin: Secondary | ICD-10-CM | POA: Diagnosis not present

## 2016-09-13 DIAGNOSIS — I472 Ventricular tachycardia: Secondary | ICD-10-CM | POA: Diagnosis not present

## 2016-09-13 DIAGNOSIS — Z836 Family history of other diseases of the respiratory system: Secondary | ICD-10-CM | POA: Diagnosis not present

## 2016-09-13 DIAGNOSIS — E119 Type 2 diabetes mellitus without complications: Secondary | ICD-10-CM | POA: Diagnosis not present

## 2016-09-14 DIAGNOSIS — R008 Other abnormalities of heart beat: Secondary | ICD-10-CM | POA: Diagnosis not present

## 2016-09-14 DIAGNOSIS — T447X1A Poisoning by beta-adrenoreceptor antagonists, accidental (unintentional), initial encounter: Secondary | ICD-10-CM | POA: Diagnosis not present

## 2016-09-14 DIAGNOSIS — K7469 Other cirrhosis of liver: Secondary | ICD-10-CM | POA: Diagnosis not present

## 2016-09-14 DIAGNOSIS — I472 Ventricular tachycardia: Secondary | ICD-10-CM | POA: Diagnosis not present

## 2016-09-14 DIAGNOSIS — I952 Hypotension due to drugs: Secondary | ICD-10-CM | POA: Diagnosis not present

## 2016-09-14 DIAGNOSIS — Z510 Encounter for antineoplastic radiation therapy: Secondary | ICD-10-CM | POA: Diagnosis not present

## 2016-09-14 DIAGNOSIS — C3411 Malignant neoplasm of upper lobe, right bronchus or lung: Secondary | ICD-10-CM | POA: Diagnosis not present

## 2016-09-17 DIAGNOSIS — R079 Chest pain, unspecified: Secondary | ICD-10-CM | POA: Diagnosis not present

## 2016-09-17 DIAGNOSIS — E119 Type 2 diabetes mellitus without complications: Secondary | ICD-10-CM | POA: Diagnosis not present

## 2016-09-17 DIAGNOSIS — Z7982 Long term (current) use of aspirin: Secondary | ICD-10-CM | POA: Diagnosis not present

## 2016-09-17 DIAGNOSIS — K760 Fatty (change of) liver, not elsewhere classified: Secondary | ICD-10-CM | POA: Diagnosis not present

## 2016-09-17 DIAGNOSIS — R0602 Shortness of breath: Secondary | ICD-10-CM | POA: Diagnosis not present

## 2016-09-17 DIAGNOSIS — Z85118 Personal history of other malignant neoplasm of bronchus and lung: Secondary | ICD-10-CM | POA: Diagnosis not present

## 2016-09-17 DIAGNOSIS — Z79899 Other long term (current) drug therapy: Secondary | ICD-10-CM | POA: Diagnosis not present

## 2016-09-17 DIAGNOSIS — I110 Hypertensive heart disease with heart failure: Secondary | ICD-10-CM | POA: Diagnosis not present

## 2016-09-17 DIAGNOSIS — R1084 Generalized abdominal pain: Secondary | ICD-10-CM | POA: Diagnosis not present

## 2016-09-17 DIAGNOSIS — I4892 Unspecified atrial flutter: Secondary | ICD-10-CM | POA: Diagnosis not present

## 2016-09-17 DIAGNOSIS — N400 Enlarged prostate without lower urinary tract symptoms: Secondary | ICD-10-CM | POA: Diagnosis not present

## 2016-09-17 DIAGNOSIS — Z794 Long term (current) use of insulin: Secondary | ICD-10-CM | POA: Diagnosis not present

## 2016-09-17 DIAGNOSIS — J449 Chronic obstructive pulmonary disease, unspecified: Secondary | ICD-10-CM | POA: Diagnosis not present

## 2016-09-17 DIAGNOSIS — Z87891 Personal history of nicotine dependence: Secondary | ICD-10-CM | POA: Diagnosis not present

## 2016-09-17 DIAGNOSIS — I509 Heart failure, unspecified: Secondary | ICD-10-CM | POA: Diagnosis not present

## 2016-09-17 DIAGNOSIS — Z7951 Long term (current) use of inhaled steroids: Secondary | ICD-10-CM | POA: Diagnosis not present

## 2016-09-18 DIAGNOSIS — Z794 Long term (current) use of insulin: Secondary | ICD-10-CM | POA: Diagnosis not present

## 2016-09-18 DIAGNOSIS — Z7951 Long term (current) use of inhaled steroids: Secondary | ICD-10-CM | POA: Diagnosis not present

## 2016-09-18 DIAGNOSIS — Z7982 Long term (current) use of aspirin: Secondary | ICD-10-CM | POA: Diagnosis not present

## 2016-09-18 DIAGNOSIS — I4891 Unspecified atrial fibrillation: Secondary | ICD-10-CM | POA: Diagnosis not present

## 2016-09-18 DIAGNOSIS — Z87891 Personal history of nicotine dependence: Secondary | ICD-10-CM | POA: Diagnosis not present

## 2016-09-18 DIAGNOSIS — Z9981 Dependence on supplemental oxygen: Secondary | ICD-10-CM | POA: Diagnosis not present

## 2016-09-18 DIAGNOSIS — I509 Heart failure, unspecified: Secondary | ICD-10-CM | POA: Diagnosis not present

## 2016-09-18 DIAGNOSIS — N400 Enlarged prostate without lower urinary tract symptoms: Secondary | ICD-10-CM | POA: Diagnosis not present

## 2016-09-18 DIAGNOSIS — E119 Type 2 diabetes mellitus without complications: Secondary | ICD-10-CM | POA: Diagnosis not present

## 2016-09-18 DIAGNOSIS — J449 Chronic obstructive pulmonary disease, unspecified: Secondary | ICD-10-CM | POA: Diagnosis not present

## 2016-09-18 DIAGNOSIS — Z79899 Other long term (current) drug therapy: Secondary | ICD-10-CM | POA: Diagnosis not present

## 2016-09-18 DIAGNOSIS — Z85118 Personal history of other malignant neoplasm of bronchus and lung: Secondary | ICD-10-CM | POA: Diagnosis not present

## 2016-09-18 DIAGNOSIS — I482 Chronic atrial fibrillation: Secondary | ICD-10-CM | POA: Diagnosis not present

## 2016-09-18 DIAGNOSIS — I110 Hypertensive heart disease with heart failure: Secondary | ICD-10-CM | POA: Diagnosis not present

## 2016-09-19 DIAGNOSIS — Z23 Encounter for immunization: Secondary | ICD-10-CM | POA: Diagnosis not present

## 2016-09-20 DIAGNOSIS — K7460 Unspecified cirrhosis of liver: Secondary | ICD-10-CM | POA: Diagnosis not present

## 2016-09-20 DIAGNOSIS — R002 Palpitations: Secondary | ICD-10-CM | POA: Diagnosis not present

## 2016-09-20 DIAGNOSIS — K7469 Other cirrhosis of liver: Secondary | ICD-10-CM | POA: Diagnosis not present

## 2016-09-20 DIAGNOSIS — J441 Chronic obstructive pulmonary disease with (acute) exacerbation: Secondary | ICD-10-CM | POA: Diagnosis not present

## 2016-09-20 DIAGNOSIS — Z801 Family history of malignant neoplasm of trachea, bronchus and lung: Secondary | ICD-10-CM | POA: Diagnosis not present

## 2016-09-20 DIAGNOSIS — Z8589 Personal history of malignant neoplasm of other organs and systems: Secondary | ICD-10-CM | POA: Diagnosis not present

## 2016-09-20 DIAGNOSIS — Z87891 Personal history of nicotine dependence: Secondary | ICD-10-CM | POA: Diagnosis not present

## 2016-09-20 DIAGNOSIS — Z79899 Other long term (current) drug therapy: Secondary | ICD-10-CM | POA: Diagnosis not present

## 2016-09-20 DIAGNOSIS — R531 Weakness: Secondary | ICD-10-CM | POA: Diagnosis not present

## 2016-09-20 DIAGNOSIS — Z7982 Long term (current) use of aspirin: Secondary | ICD-10-CM | POA: Diagnosis not present

## 2016-09-20 DIAGNOSIS — Z8249 Family history of ischemic heart disease and other diseases of the circulatory system: Secondary | ICD-10-CM | POA: Diagnosis not present

## 2016-09-20 DIAGNOSIS — K449 Diaphragmatic hernia without obstruction or gangrene: Secondary | ICD-10-CM | POA: Diagnosis not present

## 2016-09-20 DIAGNOSIS — E119 Type 2 diabetes mellitus without complications: Secondary | ICD-10-CM | POA: Diagnosis not present

## 2016-09-20 DIAGNOSIS — I110 Hypertensive heart disease with heart failure: Secondary | ICD-10-CM | POA: Diagnosis not present

## 2016-09-20 DIAGNOSIS — I5040 Unspecified combined systolic (congestive) and diastolic (congestive) heart failure: Secondary | ICD-10-CM | POA: Diagnosis not present

## 2016-09-20 DIAGNOSIS — D696 Thrombocytopenia, unspecified: Secondary | ICD-10-CM | POA: Diagnosis not present

## 2016-09-20 DIAGNOSIS — Z794 Long term (current) use of insulin: Secondary | ICD-10-CM | POA: Diagnosis not present

## 2016-09-20 DIAGNOSIS — I480 Paroxysmal atrial fibrillation: Secondary | ICD-10-CM | POA: Diagnosis not present

## 2016-09-20 DIAGNOSIS — D6949 Other primary thrombocytopenia: Secondary | ICD-10-CM | POA: Diagnosis not present

## 2016-09-20 DIAGNOSIS — R0602 Shortness of breath: Secondary | ICD-10-CM | POA: Diagnosis not present

## 2016-09-20 DIAGNOSIS — Z886 Allergy status to analgesic agent status: Secondary | ICD-10-CM | POA: Diagnosis not present

## 2016-09-20 DIAGNOSIS — Z836 Family history of other diseases of the respiratory system: Secondary | ICD-10-CM | POA: Diagnosis not present

## 2016-09-20 DIAGNOSIS — J449 Chronic obstructive pulmonary disease, unspecified: Secondary | ICD-10-CM | POA: Diagnosis not present

## 2016-09-21 DIAGNOSIS — K7469 Other cirrhosis of liver: Secondary | ICD-10-CM | POA: Diagnosis not present

## 2016-09-21 DIAGNOSIS — E119 Type 2 diabetes mellitus without complications: Secondary | ICD-10-CM | POA: Diagnosis not present

## 2016-09-21 DIAGNOSIS — J441 Chronic obstructive pulmonary disease with (acute) exacerbation: Secondary | ICD-10-CM | POA: Diagnosis not present

## 2016-09-21 DIAGNOSIS — I480 Paroxysmal atrial fibrillation: Secondary | ICD-10-CM | POA: Diagnosis not present

## 2016-09-21 DIAGNOSIS — D6949 Other primary thrombocytopenia: Secondary | ICD-10-CM | POA: Diagnosis not present

## 2016-09-22 DIAGNOSIS — J439 Emphysema, unspecified: Secondary | ICD-10-CM | POA: Diagnosis not present

## 2016-09-27 ENCOUNTER — Telehealth (HOSPITAL_COMMUNITY): Payer: Self-pay | Admitting: *Deleted

## 2016-09-27 DIAGNOSIS — I482 Chronic atrial fibrillation: Secondary | ICD-10-CM | POA: Diagnosis not present

## 2016-09-27 DIAGNOSIS — C3491 Malignant neoplasm of unspecified part of right bronchus or lung: Secondary | ICD-10-CM

## 2016-09-27 MED ORDER — OXYCODONE-ACETAMINOPHEN 5-325 MG PO TABS: 1.0000 | ORAL_TABLET | ORAL | 0 refills | Status: DC | PRN

## 2016-09-27 NOTE — Telephone Encounter (Signed)
Called patient to check and see what kind of chest pain he was having. He states his chest does not hurt all the time. It is just once in a while. Reviewed with RN. Explained to patient that since he does have a history of lung cancer, his pain could be post-radiation pain or scarring, the mass itself, or it may not be related to his lung cancer. The pain could be muscular or his heart. Encouraged patient to keep follow ups with his physicians. He can always go to ER if needed. Prescription will be ready for pick up tomorrow. Patient verbalized understanding.

## 2016-09-28 DIAGNOSIS — Z510 Encounter for antineoplastic radiation therapy: Secondary | ICD-10-CM | POA: Diagnosis not present

## 2016-09-28 DIAGNOSIS — C3411 Malignant neoplasm of upper lobe, right bronchus or lung: Secondary | ICD-10-CM | POA: Diagnosis not present

## 2016-09-28 DIAGNOSIS — C7931 Secondary malignant neoplasm of brain: Secondary | ICD-10-CM | POA: Diagnosis not present

## 2016-10-01 DIAGNOSIS — I509 Heart failure, unspecified: Secondary | ICD-10-CM | POA: Diagnosis not present

## 2016-10-01 DIAGNOSIS — Z510 Encounter for antineoplastic radiation therapy: Secondary | ICD-10-CM | POA: Diagnosis not present

## 2016-10-01 DIAGNOSIS — C7931 Secondary malignant neoplasm of brain: Secondary | ICD-10-CM | POA: Diagnosis not present

## 2016-10-01 DIAGNOSIS — C3411 Malignant neoplasm of upper lobe, right bronchus or lung: Secondary | ICD-10-CM | POA: Diagnosis not present

## 2016-10-04 DIAGNOSIS — Z794 Long term (current) use of insulin: Secondary | ICD-10-CM | POA: Diagnosis not present

## 2016-10-04 DIAGNOSIS — R0602 Shortness of breath: Secondary | ICD-10-CM | POA: Diagnosis not present

## 2016-10-04 DIAGNOSIS — E119 Type 2 diabetes mellitus without complications: Secondary | ICD-10-CM | POA: Diagnosis not present

## 2016-10-04 DIAGNOSIS — R002 Palpitations: Secondary | ICD-10-CM | POA: Diagnosis not present

## 2016-10-04 DIAGNOSIS — Z7951 Long term (current) use of inhaled steroids: Secondary | ICD-10-CM | POA: Diagnosis not present

## 2016-10-04 DIAGNOSIS — Z87891 Personal history of nicotine dependence: Secondary | ICD-10-CM | POA: Diagnosis not present

## 2016-10-04 DIAGNOSIS — Z85118 Personal history of other malignant neoplasm of bronchus and lung: Secondary | ICD-10-CM | POA: Diagnosis not present

## 2016-10-04 DIAGNOSIS — Z7982 Long term (current) use of aspirin: Secondary | ICD-10-CM | POA: Diagnosis not present

## 2016-10-04 DIAGNOSIS — I509 Heart failure, unspecified: Secondary | ICD-10-CM | POA: Diagnosis not present

## 2016-10-04 DIAGNOSIS — I110 Hypertensive heart disease with heart failure: Secondary | ICD-10-CM | POA: Diagnosis not present

## 2016-10-04 DIAGNOSIS — I4891 Unspecified atrial fibrillation: Secondary | ICD-10-CM | POA: Diagnosis not present

## 2016-10-04 DIAGNOSIS — Z79899 Other long term (current) drug therapy: Secondary | ICD-10-CM | POA: Diagnosis not present

## 2016-10-04 DIAGNOSIS — J449 Chronic obstructive pulmonary disease, unspecified: Secondary | ICD-10-CM | POA: Diagnosis not present

## 2016-10-06 DIAGNOSIS — Z87891 Personal history of nicotine dependence: Secondary | ICD-10-CM | POA: Diagnosis not present

## 2016-10-06 DIAGNOSIS — E119 Type 2 diabetes mellitus without complications: Secondary | ICD-10-CM | POA: Diagnosis not present

## 2016-10-06 DIAGNOSIS — Z85118 Personal history of other malignant neoplasm of bronchus and lung: Secondary | ICD-10-CM | POA: Diagnosis not present

## 2016-10-06 DIAGNOSIS — I110 Hypertensive heart disease with heart failure: Secondary | ICD-10-CM | POA: Diagnosis not present

## 2016-10-06 DIAGNOSIS — J9801 Acute bronchospasm: Secondary | ICD-10-CM | POA: Diagnosis not present

## 2016-10-06 DIAGNOSIS — Z7982 Long term (current) use of aspirin: Secondary | ICD-10-CM | POA: Diagnosis not present

## 2016-10-06 DIAGNOSIS — R0600 Dyspnea, unspecified: Secondary | ICD-10-CM | POA: Diagnosis not present

## 2016-10-06 DIAGNOSIS — R0602 Shortness of breath: Secondary | ICD-10-CM | POA: Diagnosis not present

## 2016-10-06 DIAGNOSIS — Z794 Long term (current) use of insulin: Secondary | ICD-10-CM | POA: Diagnosis not present

## 2016-10-06 DIAGNOSIS — Z79899 Other long term (current) drug therapy: Secondary | ICD-10-CM | POA: Diagnosis not present

## 2016-10-06 DIAGNOSIS — I509 Heart failure, unspecified: Secondary | ICD-10-CM | POA: Diagnosis not present

## 2016-10-08 DIAGNOSIS — J441 Chronic obstructive pulmonary disease with (acute) exacerbation: Secondary | ICD-10-CM | POA: Diagnosis not present

## 2016-10-09 DIAGNOSIS — J449 Chronic obstructive pulmonary disease, unspecified: Secondary | ICD-10-CM | POA: Diagnosis not present

## 2016-10-09 DIAGNOSIS — N400 Enlarged prostate without lower urinary tract symptoms: Secondary | ICD-10-CM | POA: Diagnosis not present

## 2016-10-09 DIAGNOSIS — Z87442 Personal history of urinary calculi: Secondary | ICD-10-CM | POA: Diagnosis not present

## 2016-10-09 DIAGNOSIS — Z794 Long term (current) use of insulin: Secondary | ICD-10-CM | POA: Diagnosis not present

## 2016-10-09 DIAGNOSIS — E119 Type 2 diabetes mellitus without complications: Secondary | ICD-10-CM | POA: Diagnosis not present

## 2016-10-09 DIAGNOSIS — I480 Paroxysmal atrial fibrillation: Secondary | ICD-10-CM | POA: Diagnosis not present

## 2016-10-09 DIAGNOSIS — Z7982 Long term (current) use of aspirin: Secondary | ICD-10-CM | POA: Diagnosis not present

## 2016-10-09 DIAGNOSIS — I509 Heart failure, unspecified: Secondary | ICD-10-CM | POA: Diagnosis not present

## 2016-10-09 DIAGNOSIS — Z79899 Other long term (current) drug therapy: Secondary | ICD-10-CM | POA: Diagnosis not present

## 2016-10-09 DIAGNOSIS — Z7951 Long term (current) use of inhaled steroids: Secondary | ICD-10-CM | POA: Diagnosis not present

## 2016-10-09 DIAGNOSIS — R0789 Other chest pain: Secondary | ICD-10-CM | POA: Diagnosis not present

## 2016-10-09 DIAGNOSIS — R079 Chest pain, unspecified: Secondary | ICD-10-CM | POA: Diagnosis not present

## 2016-10-09 DIAGNOSIS — I110 Hypertensive heart disease with heart failure: Secondary | ICD-10-CM | POA: Diagnosis not present

## 2016-10-09 DIAGNOSIS — Z87891 Personal history of nicotine dependence: Secondary | ICD-10-CM | POA: Diagnosis not present

## 2016-10-09 DIAGNOSIS — Z85118 Personal history of other malignant neoplasm of bronchus and lung: Secondary | ICD-10-CM | POA: Diagnosis not present

## 2016-10-11 ENCOUNTER — Encounter (HOSPITAL_COMMUNITY): Payer: Medicare Other

## 2016-10-11 DIAGNOSIS — C3411 Malignant neoplasm of upper lobe, right bronchus or lung: Secondary | ICD-10-CM | POA: Diagnosis not present

## 2016-10-11 DIAGNOSIS — Z510 Encounter for antineoplastic radiation therapy: Secondary | ICD-10-CM | POA: Diagnosis not present

## 2016-10-11 DIAGNOSIS — C7931 Secondary malignant neoplasm of brain: Secondary | ICD-10-CM | POA: Diagnosis not present

## 2016-10-14 DIAGNOSIS — J449 Chronic obstructive pulmonary disease, unspecified: Secondary | ICD-10-CM | POA: Diagnosis not present

## 2016-10-15 DIAGNOSIS — C3411 Malignant neoplasm of upper lobe, right bronchus or lung: Secondary | ICD-10-CM | POA: Diagnosis not present

## 2016-10-15 DIAGNOSIS — R918 Other nonspecific abnormal finding of lung field: Secondary | ICD-10-CM | POA: Diagnosis not present

## 2016-10-15 DIAGNOSIS — I517 Cardiomegaly: Secondary | ICD-10-CM | POA: Diagnosis not present

## 2016-10-15 DIAGNOSIS — R05 Cough: Secondary | ICD-10-CM | POA: Diagnosis not present

## 2016-10-15 DIAGNOSIS — C7931 Secondary malignant neoplasm of brain: Secondary | ICD-10-CM | POA: Diagnosis not present

## 2016-10-15 DIAGNOSIS — Z510 Encounter for antineoplastic radiation therapy: Secondary | ICD-10-CM | POA: Diagnosis not present

## 2016-10-16 DIAGNOSIS — C3411 Malignant neoplasm of upper lobe, right bronchus or lung: Secondary | ICD-10-CM | POA: Diagnosis not present

## 2016-10-16 DIAGNOSIS — Z510 Encounter for antineoplastic radiation therapy: Secondary | ICD-10-CM | POA: Diagnosis not present

## 2016-10-17 DIAGNOSIS — Z79899 Other long term (current) drug therapy: Secondary | ICD-10-CM | POA: Diagnosis not present

## 2016-10-17 DIAGNOSIS — R0602 Shortness of breath: Secondary | ICD-10-CM | POA: Diagnosis not present

## 2016-10-17 DIAGNOSIS — R072 Precordial pain: Secondary | ICD-10-CM | POA: Diagnosis not present

## 2016-10-17 DIAGNOSIS — I4891 Unspecified atrial fibrillation: Secondary | ICD-10-CM | POA: Diagnosis not present

## 2016-10-17 DIAGNOSIS — Z9221 Personal history of antineoplastic chemotherapy: Secondary | ICD-10-CM | POA: Diagnosis not present

## 2016-10-17 DIAGNOSIS — Z87891 Personal history of nicotine dependence: Secondary | ICD-10-CM | POA: Diagnosis not present

## 2016-10-17 DIAGNOSIS — Z510 Encounter for antineoplastic radiation therapy: Secondary | ICD-10-CM | POA: Diagnosis not present

## 2016-10-17 DIAGNOSIS — C3411 Malignant neoplasm of upper lobe, right bronchus or lung: Secondary | ICD-10-CM | POA: Diagnosis not present

## 2016-10-17 DIAGNOSIS — Z923 Personal history of irradiation: Secondary | ICD-10-CM | POA: Diagnosis not present

## 2016-10-17 DIAGNOSIS — Z7982 Long term (current) use of aspirin: Secondary | ICD-10-CM | POA: Diagnosis not present

## 2016-10-17 DIAGNOSIS — Z794 Long term (current) use of insulin: Secondary | ICD-10-CM | POA: Diagnosis not present

## 2016-10-17 DIAGNOSIS — R079 Chest pain, unspecified: Secondary | ICD-10-CM | POA: Diagnosis not present

## 2016-10-17 DIAGNOSIS — I10 Essential (primary) hypertension: Secondary | ICD-10-CM | POA: Diagnosis not present

## 2016-10-17 DIAGNOSIS — E119 Type 2 diabetes mellitus without complications: Secondary | ICD-10-CM | POA: Diagnosis not present

## 2016-10-18 DIAGNOSIS — C3411 Malignant neoplasm of upper lobe, right bronchus or lung: Secondary | ICD-10-CM | POA: Diagnosis not present

## 2016-10-18 DIAGNOSIS — Z510 Encounter for antineoplastic radiation therapy: Secondary | ICD-10-CM | POA: Diagnosis not present

## 2016-10-19 DIAGNOSIS — Z510 Encounter for antineoplastic radiation therapy: Secondary | ICD-10-CM | POA: Diagnosis not present

## 2016-10-19 DIAGNOSIS — C3411 Malignant neoplasm of upper lobe, right bronchus or lung: Secondary | ICD-10-CM | POA: Diagnosis not present

## 2016-10-22 DIAGNOSIS — C7931 Secondary malignant neoplasm of brain: Secondary | ICD-10-CM | POA: Diagnosis not present

## 2016-10-22 DIAGNOSIS — Z510 Encounter for antineoplastic radiation therapy: Secondary | ICD-10-CM | POA: Diagnosis not present

## 2016-10-22 DIAGNOSIS — C3411 Malignant neoplasm of upper lobe, right bronchus or lung: Secondary | ICD-10-CM | POA: Diagnosis not present

## 2016-10-23 DIAGNOSIS — C3411 Malignant neoplasm of upper lobe, right bronchus or lung: Secondary | ICD-10-CM | POA: Diagnosis not present

## 2016-10-23 DIAGNOSIS — Z510 Encounter for antineoplastic radiation therapy: Secondary | ICD-10-CM | POA: Diagnosis not present

## 2016-10-24 DIAGNOSIS — Z510 Encounter for antineoplastic radiation therapy: Secondary | ICD-10-CM | POA: Diagnosis not present

## 2016-10-24 DIAGNOSIS — C3411 Malignant neoplasm of upper lobe, right bronchus or lung: Secondary | ICD-10-CM | POA: Diagnosis not present

## 2016-10-25 DIAGNOSIS — C3411 Malignant neoplasm of upper lobe, right bronchus or lung: Secondary | ICD-10-CM | POA: Diagnosis not present

## 2016-10-25 DIAGNOSIS — Z510 Encounter for antineoplastic radiation therapy: Secondary | ICD-10-CM | POA: Diagnosis not present

## 2016-10-26 DIAGNOSIS — Z510 Encounter for antineoplastic radiation therapy: Secondary | ICD-10-CM | POA: Diagnosis not present

## 2016-10-26 DIAGNOSIS — C3411 Malignant neoplasm of upper lobe, right bronchus or lung: Secondary | ICD-10-CM | POA: Diagnosis not present

## 2016-10-29 ENCOUNTER — Ambulatory Visit: Payer: Medicare Other | Admitting: Cardiology

## 2016-10-29 ENCOUNTER — Encounter: Payer: Self-pay | Admitting: Cardiology

## 2016-10-29 DIAGNOSIS — R0789 Other chest pain: Secondary | ICD-10-CM | POA: Diagnosis not present

## 2016-10-29 DIAGNOSIS — Z801 Family history of malignant neoplasm of trachea, bronchus and lung: Secondary | ICD-10-CM | POA: Diagnosis not present

## 2016-10-29 DIAGNOSIS — I4891 Unspecified atrial fibrillation: Secondary | ICD-10-CM | POA: Diagnosis not present

## 2016-10-29 DIAGNOSIS — Z825 Family history of asthma and other chronic lower respiratory diseases: Secondary | ICD-10-CM | POA: Diagnosis not present

## 2016-10-29 DIAGNOSIS — Z9981 Dependence on supplemental oxygen: Secondary | ICD-10-CM | POA: Diagnosis not present

## 2016-10-29 DIAGNOSIS — K449 Diaphragmatic hernia without obstruction or gangrene: Secondary | ICD-10-CM | POA: Diagnosis not present

## 2016-10-29 DIAGNOSIS — Z7951 Long term (current) use of inhaled steroids: Secondary | ICD-10-CM | POA: Diagnosis not present

## 2016-10-29 DIAGNOSIS — I509 Heart failure, unspecified: Secondary | ICD-10-CM | POA: Diagnosis not present

## 2016-10-29 DIAGNOSIS — E1165 Type 2 diabetes mellitus with hyperglycemia: Secondary | ICD-10-CM | POA: Diagnosis not present

## 2016-10-29 DIAGNOSIS — J441 Chronic obstructive pulmonary disease with (acute) exacerbation: Secondary | ICD-10-CM | POA: Diagnosis not present

## 2016-10-29 DIAGNOSIS — K7469 Other cirrhosis of liver: Secondary | ICD-10-CM | POA: Diagnosis not present

## 2016-10-29 DIAGNOSIS — Z79899 Other long term (current) drug therapy: Secondary | ICD-10-CM | POA: Diagnosis not present

## 2016-10-29 DIAGNOSIS — K7460 Unspecified cirrhosis of liver: Secondary | ICD-10-CM | POA: Diagnosis not present

## 2016-10-29 DIAGNOSIS — I471 Supraventricular tachycardia: Secondary | ICD-10-CM | POA: Diagnosis not present

## 2016-10-29 DIAGNOSIS — Z886 Allergy status to analgesic agent status: Secondary | ICD-10-CM | POA: Diagnosis not present

## 2016-10-29 DIAGNOSIS — E860 Dehydration: Secondary | ICD-10-CM | POA: Diagnosis not present

## 2016-10-29 DIAGNOSIS — Z7982 Long term (current) use of aspirin: Secondary | ICD-10-CM | POA: Diagnosis not present

## 2016-10-29 DIAGNOSIS — I110 Hypertensive heart disease with heart failure: Secondary | ICD-10-CM | POA: Diagnosis not present

## 2016-10-29 DIAGNOSIS — R079 Chest pain, unspecified: Secondary | ICD-10-CM | POA: Diagnosis not present

## 2016-10-29 DIAGNOSIS — I5042 Chronic combined systolic (congestive) and diastolic (congestive) heart failure: Secondary | ICD-10-CM | POA: Diagnosis not present

## 2016-10-29 DIAGNOSIS — Z794 Long term (current) use of insulin: Secondary | ICD-10-CM | POA: Diagnosis not present

## 2016-10-29 DIAGNOSIS — I480 Paroxysmal atrial fibrillation: Secondary | ICD-10-CM | POA: Diagnosis not present

## 2016-10-29 DIAGNOSIS — Z8249 Family history of ischemic heart disease and other diseases of the circulatory system: Secondary | ICD-10-CM | POA: Diagnosis not present

## 2016-10-29 NOTE — Progress Notes (Deleted)
Cardiology Office Note  Date: 10/29/2016   ID: Chase Herrera, Chase Herrera [birth year], MRN [id number]  PCP: Neale Burly, MD  Primary Cardiologist: [name], MD   No chief complaint on file.   History of Present Illness: Chase Herrera is a 66 y.o. male followed by our practice in the past (I saw him in 2013) more recently through he [facility] cardiology practice. He was seen by Dr. Metta Clines in January of this year for preoperative cardiac evaluation prior to lung surgery. Myocardial perfusion study at that time was low risk as outlined below. He presents today to reestablish with our practice. I reviewed extensive records and updated the chart.  Patient is following at the [facility] in [place]. 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 He has undergone chemotherapy and also radiation treatments.  Recent records indicate ER visit at [facility] in September with symptomatic rapid atrial fibrillation. I personally reviewed the recent tracings from September 15 and symptoms of her 64, both showing probable typical atrial flutter with variable conduction, low voltage with poor R-wave progression.  Past Medical History:  Diagnosis Date  . Alcohol abuse    Quit 08/23/11  . Arthritis   . Atrial fibrillation ([place])   . Atrial flutter ([place])    CTI ablation by Dr [name] 10/2011  . COPD (chronic obstructive pulmonary disease) ([place])   . Depression   . Essential hypertension   . History of cardiomyopathy    LVEF 25-30% 08/2011 with subsequent normalization  . Obesity   . Prostate enlargement   . Small cell lung cancer ([place])    Right upper lobe - follows with [facility]  . Type 2 diabetes mellitus ([place])     Past Surgical History:  Procedure Laterality Date  . APPENDECTOMY    . ATRIAL  ABLATION SURGERY  11/06/2011   CTI ablation for atrial flutter by Dr [name]  . ATRIAL FLUTTER ABLATION N/A 11/06/2011   Procedure: ATRIAL FLUTTER ABLATION;  Surgeon: Thompson Grayer, MD;  Location: [facility] C[facility] LAB;  Service: Cardiovascular;  Laterality: N/A;  . Cataracts    . IR GENERIC HISTORICAL  04/09/2016   IR US GUIDE VASC ACCESS RIGHT 04/09/2016 [name], DO [facility]-INTERV RAD  . IR GENERIC HISTORICAL  04/09/2016   IR FLUORO GUIDE PORT INSERTION RIGHT 04/09/2016 [name], DO [facility]  . TONSILLECTOMY      Current Outpatient Prescriptions  Medication Sig Dispense Refill  . albuterol (PROVENTIL HFA;VENTOLIN HFA) 108 (90 BASE) MCG/ACT inhaler Inhale 2 puffs into the lungs every 6 (six) hours as needed. For shortness of breath    . aspirin EC 81 MG tablet Take 81 mg by mouth daily.    . carvedilol (COREG) 25 MG tablet Take 25 mg by mouth 2 (two) times daily with a meal.    . diltiazem (CARDIZEM CD) 240 MG 24 hr capsule Take 240 mg by mouth.    . furosemide (LASIX) 40 MG tablet Take 40 mg by mouth daily.    Marland Kitchen gabapentin (NEURONTIN) 300 MG capsule Take by mouth.    . insulin glargine (LANTUS) 100 UNIT/ML injection Inject 25 Units into the skin 2 (two) times daily.    . insulin lispro (HUMALOG) 100 UNIT/ML injection Inject into the skin. SSI    . ipratropium-albuterol (DUONEB) 0.5-2.5 (3)  MG/3ML SOLN Take 3 mLs by nebulization every 6 (six) hours as needed (shortness of breath/wheezing).    . LORazepam (ATIVAN) 0.5 MG tablet Take 1 tablet (0.5 mg total) by mouth every 6 (six) hours as needed (Nausea or vomiting). 30 tablet 0  . metformin (FORTAMET) 500 MG (OSM) 24 hr tablet Take 1,000 mg by mouth.    . Multiple Vitamin (MULTIVITAMIN) capsule Take by mouth.    . ondansetron (ZOFRAN) 8 MG tablet Take 1 tablet (8 mg total) by mouth 2 (two) times daily as needed. Start on the third day after cisplatin chemotherapy. (Patient taking differently: Take 8 mg by mouth 2 (two) times daily as needed. Start  on the third day after cisplatin chemotherapy. # 1 med to take for nausea) 30 tablet 1  . oxyCODONE-acetaminophen (PERCOCET/ROXICET) 5-325 MG tablet Take 1-2 tablets by mouth every 4 (four) hours as needed for severe pain. 90 tablet 0  . prochlorperazine (COMPAZINE) 10 MG tablet Take 1 tablet (10 mg total) by mouth every 6 (six) hours as needed (Nausea or vomiting). (Patient taking differently: Take 10 mg by mouth every 6 (six) hours as needed (Nausea or vomiting). #2 to take for nausea) 30 tablet 1  . Rivaroxaban (XARELTO) 15 MG TABS tablet Take 15 mg by mouth.    . sucralfate (CARAFATE) 1 GM/10ML suspension Take by mouth.    . tamsulosin (FLOMAX) 0.4 MG CAPS capsule Take by mouth.     No current facility-administered medications for this visit.    Allergies:  Codeine   Social History: The patient  reports that he has quit smoking. His smoking use included Cigarettes. He has a 23.50 pack-year smoking history. He quit smokeless tobacco use about 2 years ago. He reports that he drinks alcohol. He reports that he does not use drugs.   Family History: The patient's family history includes Diabetes in his sister; Heart attack in his father; Hypertension in his father, mother, sister, sister, and sister; Lung cancer in his mother; Pulmonary fibrosis in his sister.   ROS:  Please see the history of present illness. Otherwise, complete review of systems is positive for {NONE DEFAULTED:18576::"none"}.  All other systems are reviewed and negative.   Physical Exam: VS:  There were no vitals taken for this visit., BMI There is no height or weight on file to calculate BMI.  Wt Readings from Last 3 Encounters:  08/23/16 278 lb (126.1 kg)  06/21/16 288 lb (130.6 kg)  06/20/16 293 lb 9.6 oz (133.2 kg)    General: Patient appears comfortable at rest. HEENT: Conjunctiva and lids normal, oropharynx clear with moist mucosa. Neck: Supple, no elevated JVP or carotid bruits, no thyromegaly. Lungs: Clear to  auscultation, nonlabored breathing at rest. Cardiac: Regular rate and rhythm, no S3 or significant systolic murmur, no pericardial rub. Abdomen: Soft, nontender, no hepatomegaly, bowel sounds present, no guarding or rebound. Extremities: No pitting edema, distal pulses 2+. Skin: Warm and dry. Musculoskeletal: No kyphosis. Neuropsychiatric: Alert and oriented x3, affect grossly appropriate.  ECG: I personally reviewed the tracing from 05/30/2016 which showed sinus rhythm with low voltage and poor R-wave progression.  Recent Labwork: 04/18/2016: B Natriuretic Peptide 163.0 06/19/2016: ALT 21; AST 22; BUN 14; Creatinine, Ser 1.30; Magnesium 1.3; Potassium 3.8; Sodium 138 06/20/2016: Hemoglobin 9.4; Platelets 237  September 2018: Potassium 3.9, BUN 33, creatinine 1.1, AST 16, ALT 27, NT-pro BNP 1083, hemoglobin 12.4, platelets 156, troponin T less than 0.01  Other Studies Reviewed Today:  Lexiscan Myoview 02/14/2016: TECHNIQUE:  A Lexiscan stress protocol was used. 10.1 mCi of Tc-30m Tetrofosmin was administered intravenously at rest and 32.9 mCi was administered intravenously at stress. Gated SPECT images were obtained.  LIMITATIONS: None.  FINDINGS:  Physiological distribution of the radiotracer.  No unexpected findings on raw images.  No transient ischemic dilatation.  No fixed or transient perfusion defects at stress or rest. Note: Attenuation corrected images were evaluated.  Gated images:  End diastolic volume: 828 cc  End systolic volume: 41 cc  Ejection fraction: 64%.  Wall motion abnormalities: None.  Echocardiogram 01/31/2012: Study Conclusions  - Left ventricle: The cavity size was at the upper limits of normal. Wall thickness was increased in a pattern of mild LVH. Systolic function was normal. The estimated ejection fraction was in the range of 55% to 60%. Wall motion was normal; there were no regional wall motion abnormalities. Doppler parameters are  consistent with abnormal left ventricular relaxation (grade 1 diastolic dysfunction). There has been normalization of LV function compared to previous study, supporting diagnosis of tachycardia mediated cardiomyopathy prior to atrial flutter ablation. - Mitral valve: Mildly thickened leaflets . Trivial regurgitation. - Left atrium: The atrium was mildly dilated. - Tricuspid valve: Trivial regurgitation. - Pericardium, extracardiac: There was no pericardial effusion.  Assessment and Plan:    Current medicines were reviewed with the patient today.  No orders of the defined types were placed in this encounter.   Disposition:  Signed, Satira Sark, MD, [facility] 10/29/2016 8:37 AM    [facility] [place], [place], [place] [address] Phone: [phone]; Fax: [phone]

## 2016-11-01 NOTE — Progress Notes (Signed)
Cardiology Office Note   Date:  11/02/2016   ID:  Chase Herrera, Chase Herrera 1950-07-12, MRN [id number]  PCP:  Neale Burly, MD  Cardiologist:    Allred/McDowell   No chief complaint on file.     History of Present Illness: Chase Herrera is a 66 y.o. male who presents for consultation regarding atrial fib/flutter and history of DCM Referred By Dr Sherrie Sport.   History of RULsmall cell  lung CA with severe COPD Quit smoking in 2016. Seen by Dr [name] in 2013 with ablation. Cath 2014 with no CAD and myovue 2016 at [facility] normal. Occasional palpitations Has been Rx with cardizem Coreg and xarelto for his PAF Seen by [facility] January 2018 for preop VATS clearence and had normal  lexiscan myovue with EF 64% RX with XRT and PET scan 09/14/16 with marked reduction in size metabolic mass In RUL from 6 cm to 3 cm   Last seen here by Dr [name] indicated flutter ablation and DCM Echo 01/31/12 showed EF recovered in NSR To 55-60% Mild LAE trivial MR   Reviewed over 50 pages [facility]. 10/06/16 had COPD flair and went to ER for breathing RX and wanted solumedrol shot. Then came back 9/18 with chest pain dyspnea and PAF ECG 10/09/16 artifact but To me appears to be flutter with rate 121 poor R wave progression On 10/04/16 was clearly in NSR   Notes from Dr Tobey Bride indicate patient struggles with non compliance, ETOH abuse and smoking However Patient indicates he has not smoked or had alcohol for 3 years.   In hospital they stopped his coreg and xarelto He is in afib today at rapid rate     Past Medical History:  Diagnosis Date  . Alcohol abuse    Quit 08/23/11  . Arthritis   . Atrial fibrillation ([place])   . Atrial flutter ([place])    CTI ablation by Dr [name] 10/2011  . COPD (chronic obstructive pulmonary disease) ([place])   . Depression   . Essential hypertension   . History of cardiomyopathy    LVEF 25-30% 08/2011 with subsequent normalization  .  Obesity   . Prostate enlargement   . Small cell lung cancer ([place])    Right upper lobe - follows with [facility]  . Type 2 diabetes mellitus ([place])     Past Surgical History:  Procedure Laterality Date  . APPENDECTOMY    . ATRIAL ABLATION SURGERY  11/06/2011   CTI ablation for atrial flutter by Dr [name]  . ATRIAL FLUTTER ABLATION N/A 11/06/2011   Procedure: ATRIAL FLUTTER ABLATION;  Surgeon: Thompson Grayer, MD;  Location: [facility] CATH LAB;  Service: Cardiovascular;  Laterality: N/A;  . Cataracts    . IR GENERIC HISTORICAL  04/09/2016   IR US GUIDE VASC ACCESS RIGHT 04/09/2016 [name], DO [facility]INTER[facility]  . IR GENERIC HISTORICAL  04/09/2016   IR FLUORO GUIDE PORT INSERTION RIGHT 04/09/2016 [name], DO [facility]  . TONSILLECTOMY       Current Outpatient Prescriptions  Medication Sig Dispense Refill  . albuterol (PROVENTIL HFA;VENTOLIN HFA) 108 (90 BASE) MCG/ACT inhaler Inhale 2 puffs into the lungs every 6 (six) hours as needed. For shortness of breath    . aspirin EC 81 MG tablet Take 81 mg by mouth daily.    Marland Kitchen diltiazem (TIAZAC) 360 MG 24 hr capsule Take 360 mg by mouth daily.  0  . furosemide (LASIX) 40 MG tablet  Take 40 mg by mouth daily.    Marland Kitchen gabapentin (NEURONTIN) 300 MG capsule Take 300 mg by mouth 3 (three) times daily.     . insulin glargine (LANTUS) 100 UNIT/ML injection Inject 25 Units into the skin 2 (two) times daily.    . insulin lispro (HUMALOG) 100 UNIT/ML injection Inject into the skin. SSI    . metformin (FORTAMET) 500 MG (OSM) 24 hr tablet Take 1,000 mg by mouth. 1,00mg  am and 500 mg pm    . Multiple Vitamin (MULTIVITAMIN) capsule Take by mouth.    . oxyCODONE-acetaminophen (PERCOCET/ROXICET) 5-325 MG tablet Take 1-2 tablets by mouth every 4 (four) hours as needed for severe pain. 90 tablet 0  . tamsulosin (FLOMAX) 0.4 MG CAPS capsule Take by mouth.    Marland Kitchen LORazepam (ATIVAN) 0.5 MG tablet Take 1 tablet (0.5 mg total) by mouth every 6 (six) hours as needed  (Nausea or vomiting). (Patient not taking: Reported on 11/02/2016) 30 tablet 0   No current facility-administered medications for this visit.     Allergies:   Codeine    Social History:  The patient  reports that he has quit smoking. His smoking use included Cigarettes. He has a 23.50 pack-year smoking history. He quit smokeless tobacco use about 2 years ago. He reports that he drinks alcohol. He reports that he does not use drugs.   Family History:  The patient's family history includes Diabetes in his sister; Heart attack in his father; Hypertension in his father, mother, sister, sister, and sister; Lung cancer in his mother; Pulmonary fibrosis in his sister.    ROS:  Please see the history of present illness.   Otherwise, review of systems are positive for none.   All other systems are reviewed and negative.    PHYSICAL EXAM: VS:  BP 118/66   Pulse (!) 110   Ht 6\' 1"  (1.854 m)   Wt 280 lb (127 kg)   SpO2 97%   BMI 36.94 kg/m  , BMI Body mass index is 36.94 kg/m. Affect appropriate Chronically ill obese white male  HEENT: normal Neck supple with no adenopathy JVP normal no bruits no thyromegaly Lungs Rhonchi and exp wheezing and good diaphragmatic motion Heart:  S1/S2 no murmur, no rub, gallop or click PMI normal Abdomen: benighn, BS positve, no tenderness, no AAA no bruit.  No HSM or HJR Distal pulses intact with no bruits No edema Neuro non-focal Skin warm and dry No muscular weakness    EKG:  05/30/16 SR rate 68 LAD low voltage    Recent Labs: 04/18/2016: B Natriuretic Peptide 163.0 06/19/2016: ALT 21; BUN 14; Creatinine, Ser 1.30; Magnesium 1.3; Potassium 3.8; Sodium 138 06/20/2016: Hemoglobin 9.4; Platelets 237    Lipid Panel No results found for: CHOL, TRIG, HDL, CHOLHDL, VLDL, LDLCALC, LDLDIRECT    Wt Readings from Last 3 Encounters:  11/02/16 280 lb (127 kg)  08/23/16 278 lb (126.1 kg)  06/21/16 288 lb (130.6 kg)      Other studies  Reviewed: Additional studies/ records that were reviewed today include: Notes Dr [name] 2013, Notes oncology ECG echo And myovue results .    ASSESSMENT AND PLAN:  1.  Afib/Flutter: will resume low dose xarelto at 15 mg he has not had bleeding issues and no invasive plans for Rx lung cancer Start back on lopressor He will call as with his dose of lopressor he has at home Continue cardizem. Will do echo and have  Him f/u in [place] where he wants  to be seen with Dr [name]. Can consider [facility] after being back on anticoagulation 2. DCM  F/u echo not clear what EF is at this point  3. COPD/Lung Cancer recent hospitalization for exacerbation Recent PET scan good regression of tumor burden post XRT/chemo 4. DM: Discussed low carb diet.  Target hemoglobin A1c is 6.5 or less.  Continue current medications.    Current medicines are reviewed at length with the patient today.  The patient does not have concerns regarding medicines.  The following changes have been made:  Resume xarelto 15 mg and lopressor patient to call with dose he has at home   Labs/ tests ordered today include: Echo   Orders Placed This Encounter  Procedures  . ECHOCARDIOGRAM COMPLETE     Disposition:   FU with Dr [name] in [place] 4 weeks      Signed, Jenkins Rouge, MD  11/02/2016 1:58 PM    [facility], [place], [place]  [address] Phone: [phone]; Fax: [phone]

## 2016-11-02 ENCOUNTER — Ambulatory Visit (INDEPENDENT_AMBULATORY_CARE_PROVIDER_SITE_OTHER): Payer: Medicare Other | Admitting: Cardiovascular Disease

## 2016-11-02 ENCOUNTER — Telehealth: Payer: Self-pay | Admitting: Cardiovascular Disease

## 2016-11-02 ENCOUNTER — Encounter: Payer: Self-pay | Admitting: Cardiovascular Disease

## 2016-11-02 VITALS — BP 118/66 | HR 110 | Ht 73.0 in | Wt 280.0 lb

## 2016-11-02 DIAGNOSIS — I482 Chronic atrial fibrillation, unspecified: Secondary | ICD-10-CM

## 2016-11-02 MED ORDER — METOPROLOL TARTRATE 50 MG PO TABS: 50.0000 mg | ORAL_TABLET | Freq: Two times a day (BID) | ORAL | 3 refills | Status: DC

## 2016-11-02 NOTE — Telephone Encounter (Signed)
He can take that

## 2016-11-02 NOTE — Telephone Encounter (Signed)
Patient states he was told to call back with medication that wasn't on list. It's Chase Herrera 50mg  BID. / tg

## 2016-11-02 NOTE — Patient Instructions (Signed)
Your physician recommends that you schedule a follow-up appointment in: 1 month Dr.McDowell in [place] has requested that you have an echocardiogramin [place] Echocardiography is a painless test that uses sound waves to create images of your heart. It provides your doctor with information about the size and shape of your heart and how well your heart's chambers and valves are working. This procedure takes approximately one hour. There are no restrictions for this procedure.   Your physician recommends that you continue on your current medications as directed. Please refer to the Current Medication list given to you today.    Call us back if you are taking metoprolol   No labs ordered today.     Thank you for choosing Chase Herrera !

## 2016-11-02 NOTE — Telephone Encounter (Signed)
I will inform Dr.Nishan

## 2016-11-02 NOTE — Telephone Encounter (Signed)
Pt notified he can take metoprolol

## 2016-11-04 DIAGNOSIS — Z7982 Long term (current) use of aspirin: Secondary | ICD-10-CM | POA: Diagnosis not present

## 2016-11-04 DIAGNOSIS — K7460 Unspecified cirrhosis of liver: Secondary | ICD-10-CM | POA: Diagnosis not present

## 2016-11-04 DIAGNOSIS — Z8249 Family history of ischemic heart disease and other diseases of the circulatory system: Secondary | ICD-10-CM | POA: Diagnosis not present

## 2016-11-04 DIAGNOSIS — Z836 Family history of other diseases of the respiratory system: Secondary | ICD-10-CM | POA: Diagnosis not present

## 2016-11-04 DIAGNOSIS — Z7901 Long term (current) use of anticoagulants: Secondary | ICD-10-CM | POA: Diagnosis not present

## 2016-11-04 DIAGNOSIS — J449 Chronic obstructive pulmonary disease, unspecified: Secondary | ICD-10-CM | POA: Diagnosis not present

## 2016-11-04 DIAGNOSIS — R0602 Shortness of breath: Secondary | ICD-10-CM | POA: Diagnosis not present

## 2016-11-04 DIAGNOSIS — R002 Palpitations: Secondary | ICD-10-CM | POA: Diagnosis not present

## 2016-11-04 DIAGNOSIS — I480 Paroxysmal atrial fibrillation: Secondary | ICD-10-CM | POA: Diagnosis not present

## 2016-11-04 DIAGNOSIS — Z7952 Long term (current) use of systemic steroids: Secondary | ICD-10-CM | POA: Diagnosis not present

## 2016-11-04 DIAGNOSIS — Z886 Allergy status to analgesic agent status: Secondary | ICD-10-CM | POA: Diagnosis not present

## 2016-11-04 DIAGNOSIS — R42 Dizziness and giddiness: Secondary | ICD-10-CM | POA: Diagnosis not present

## 2016-11-04 DIAGNOSIS — Z85118 Personal history of other malignant neoplasm of bronchus and lung: Secondary | ICD-10-CM | POA: Diagnosis not present

## 2016-11-04 DIAGNOSIS — R079 Chest pain, unspecified: Secondary | ICD-10-CM | POA: Diagnosis not present

## 2016-11-04 DIAGNOSIS — Z7951 Long term (current) use of inhaled steroids: Secondary | ICD-10-CM | POA: Diagnosis not present

## 2016-11-04 DIAGNOSIS — D696 Thrombocytopenia, unspecified: Secondary | ICD-10-CM | POA: Diagnosis not present

## 2016-11-04 DIAGNOSIS — K7469 Other cirrhosis of liver: Secondary | ICD-10-CM | POA: Diagnosis not present

## 2016-11-04 DIAGNOSIS — Z794 Long term (current) use of insulin: Secondary | ICD-10-CM | POA: Diagnosis not present

## 2016-11-04 DIAGNOSIS — Z79899 Other long term (current) drug therapy: Secondary | ICD-10-CM | POA: Diagnosis not present

## 2016-11-04 DIAGNOSIS — I110 Hypertensive heart disease with heart failure: Secondary | ICD-10-CM | POA: Diagnosis not present

## 2016-11-04 DIAGNOSIS — R0789 Other chest pain: Secondary | ICD-10-CM | POA: Diagnosis not present

## 2016-11-04 DIAGNOSIS — Z87891 Personal history of nicotine dependence: Secondary | ICD-10-CM | POA: Diagnosis not present

## 2016-11-04 DIAGNOSIS — K449 Diaphragmatic hernia without obstruction or gangrene: Secondary | ICD-10-CM | POA: Diagnosis not present

## 2016-11-04 DIAGNOSIS — J441 Chronic obstructive pulmonary disease with (acute) exacerbation: Secondary | ICD-10-CM | POA: Diagnosis not present

## 2016-11-04 DIAGNOSIS — R531 Weakness: Secondary | ICD-10-CM | POA: Diagnosis not present

## 2016-11-04 DIAGNOSIS — R918 Other nonspecific abnormal finding of lung field: Secondary | ICD-10-CM | POA: Diagnosis not present

## 2016-11-04 DIAGNOSIS — E119 Type 2 diabetes mellitus without complications: Secondary | ICD-10-CM | POA: Diagnosis not present

## 2016-11-04 DIAGNOSIS — I5040 Unspecified combined systolic (congestive) and diastolic (congestive) heart failure: Secondary | ICD-10-CM | POA: Diagnosis not present

## 2016-11-04 DIAGNOSIS — I4891 Unspecified atrial fibrillation: Secondary | ICD-10-CM | POA: Diagnosis not present

## 2016-11-05 DIAGNOSIS — I110 Hypertensive heart disease with heart failure: Secondary | ICD-10-CM | POA: Diagnosis not present

## 2016-11-05 DIAGNOSIS — K7469 Other cirrhosis of liver: Secondary | ICD-10-CM | POA: Diagnosis not present

## 2016-11-05 DIAGNOSIS — I480 Paroxysmal atrial fibrillation: Secondary | ICD-10-CM | POA: Diagnosis not present

## 2016-11-05 DIAGNOSIS — J441 Chronic obstructive pulmonary disease with (acute) exacerbation: Secondary | ICD-10-CM | POA: Diagnosis not present

## 2016-11-05 DIAGNOSIS — E119 Type 2 diabetes mellitus without complications: Secondary | ICD-10-CM | POA: Diagnosis not present

## 2016-11-07 DIAGNOSIS — J441 Chronic obstructive pulmonary disease with (acute) exacerbation: Secondary | ICD-10-CM | POA: Diagnosis not present

## 2016-11-07 DIAGNOSIS — K7469 Other cirrhosis of liver: Secondary | ICD-10-CM | POA: Diagnosis not present

## 2016-11-07 DIAGNOSIS — E119 Type 2 diabetes mellitus without complications: Secondary | ICD-10-CM | POA: Diagnosis not present

## 2016-11-07 DIAGNOSIS — I110 Hypertensive heart disease with heart failure: Secondary | ICD-10-CM | POA: Diagnosis not present

## 2016-11-07 DIAGNOSIS — I480 Paroxysmal atrial fibrillation: Secondary | ICD-10-CM | POA: Diagnosis not present

## 2016-11-15 ENCOUNTER — Ambulatory Visit (INDEPENDENT_AMBULATORY_CARE_PROVIDER_SITE_OTHER): Payer: Medicare Other

## 2016-11-15 ENCOUNTER — Other Ambulatory Visit: Payer: Self-pay

## 2016-11-15 DIAGNOSIS — I482 Chronic atrial fibrillation, unspecified: Secondary | ICD-10-CM

## 2016-11-15 DIAGNOSIS — Z825 Family history of asthma and other chronic lower respiratory diseases: Secondary | ICD-10-CM | POA: Diagnosis not present

## 2016-11-15 DIAGNOSIS — Z801 Family history of malignant neoplasm of trachea, bronchus and lung: Secondary | ICD-10-CM | POA: Diagnosis not present

## 2016-11-15 DIAGNOSIS — I5042 Chronic combined systolic (congestive) and diastolic (congestive) heart failure: Secondary | ICD-10-CM | POA: Diagnosis not present

## 2016-11-15 DIAGNOSIS — J441 Chronic obstructive pulmonary disease with (acute) exacerbation: Secondary | ICD-10-CM | POA: Diagnosis not present

## 2016-11-15 DIAGNOSIS — I4581 Long QT syndrome: Secondary | ICD-10-CM | POA: Diagnosis not present

## 2016-11-15 DIAGNOSIS — K7460 Unspecified cirrhosis of liver: Secondary | ICD-10-CM | POA: Diagnosis not present

## 2016-11-15 DIAGNOSIS — J158 Pneumonia due to other specified bacteria: Secondary | ICD-10-CM | POA: Diagnosis not present

## 2016-11-15 DIAGNOSIS — Z87891 Personal history of nicotine dependence: Secondary | ICD-10-CM | POA: Diagnosis not present

## 2016-11-15 DIAGNOSIS — Z79891 Long term (current) use of opiate analgesic: Secondary | ICD-10-CM | POA: Diagnosis not present

## 2016-11-15 DIAGNOSIS — Z886 Allergy status to analgesic agent status: Secondary | ICD-10-CM | POA: Diagnosis not present

## 2016-11-15 DIAGNOSIS — Z7901 Long term (current) use of anticoagulants: Secondary | ICD-10-CM | POA: Diagnosis not present

## 2016-11-15 DIAGNOSIS — N183 Chronic kidney disease, stage 3 (moderate): Secondary | ICD-10-CM | POA: Diagnosis not present

## 2016-11-15 DIAGNOSIS — Z79899 Other long term (current) drug therapy: Secondary | ICD-10-CM | POA: Diagnosis not present

## 2016-11-15 DIAGNOSIS — Z7951 Long term (current) use of inhaled steroids: Secondary | ICD-10-CM | POA: Diagnosis not present

## 2016-11-15 DIAGNOSIS — Z794 Long term (current) use of insulin: Secondary | ICD-10-CM | POA: Diagnosis not present

## 2016-11-15 DIAGNOSIS — J440 Chronic obstructive pulmonary disease with acute lower respiratory infection: Secondary | ICD-10-CM | POA: Diagnosis not present

## 2016-11-15 DIAGNOSIS — C3411 Malignant neoplasm of upper lobe, right bronchus or lung: Secondary | ICD-10-CM | POA: Diagnosis not present

## 2016-11-15 DIAGNOSIS — Z8249 Family history of ischemic heart disease and other diseases of the circulatory system: Secondary | ICD-10-CM | POA: Diagnosis not present

## 2016-11-15 DIAGNOSIS — J181 Lobar pneumonia, unspecified organism: Secondary | ICD-10-CM | POA: Diagnosis not present

## 2016-11-15 DIAGNOSIS — K449 Diaphragmatic hernia without obstruction or gangrene: Secondary | ICD-10-CM | POA: Diagnosis not present

## 2016-11-15 DIAGNOSIS — R7989 Other specified abnormal findings of blood chemistry: Secondary | ICD-10-CM | POA: Diagnosis not present

## 2016-11-15 DIAGNOSIS — R0602 Shortness of breath: Secondary | ICD-10-CM | POA: Diagnosis not present

## 2016-11-15 DIAGNOSIS — I130 Hypertensive heart and chronic kidney disease with heart failure and stage 1 through stage 4 chronic kidney disease, or unspecified chronic kidney disease: Secondary | ICD-10-CM | POA: Diagnosis not present

## 2016-11-15 DIAGNOSIS — J189 Pneumonia, unspecified organism: Secondary | ICD-10-CM | POA: Diagnosis not present

## 2016-11-15 DIAGNOSIS — E1122 Type 2 diabetes mellitus with diabetic chronic kidney disease: Secondary | ICD-10-CM | POA: Diagnosis not present

## 2016-11-16 ENCOUNTER — Other Ambulatory Visit (HOSPITAL_COMMUNITY): Payer: Medicare Other

## 2016-11-20 ENCOUNTER — Telehealth (HOSPITAL_COMMUNITY): Payer: Self-pay

## 2016-11-20 ENCOUNTER — Ambulatory Visit (HOSPITAL_COMMUNITY): Payer: Medicare Other

## 2016-11-20 NOTE — Telephone Encounter (Signed)
Patient called wanting to know why he was having a CT of his abdomen and pelvis. He states he does not have cancer there. I called patient and explained to him that it is the standard of care to obtain CT of chest, abdomen, and pelvis. He verbalized understanding.

## 2016-11-22 ENCOUNTER — Ambulatory Visit (HOSPITAL_COMMUNITY): Payer: Medicare Other

## 2016-11-24 DIAGNOSIS — I4891 Unspecified atrial fibrillation: Secondary | ICD-10-CM | POA: Diagnosis not present

## 2016-11-24 DIAGNOSIS — Z794 Long term (current) use of insulin: Secondary | ICD-10-CM | POA: Diagnosis not present

## 2016-11-24 DIAGNOSIS — Z7982 Long term (current) use of aspirin: Secondary | ICD-10-CM | POA: Diagnosis not present

## 2016-11-24 DIAGNOSIS — I110 Hypertensive heart disease with heart failure: Secondary | ICD-10-CM | POA: Diagnosis not present

## 2016-11-24 DIAGNOSIS — Z7951 Long term (current) use of inhaled steroids: Secondary | ICD-10-CM | POA: Diagnosis not present

## 2016-11-24 DIAGNOSIS — J440 Chronic obstructive pulmonary disease with acute lower respiratory infection: Secondary | ICD-10-CM | POA: Diagnosis not present

## 2016-11-24 DIAGNOSIS — J189 Pneumonia, unspecified organism: Secondary | ICD-10-CM | POA: Diagnosis not present

## 2016-11-24 DIAGNOSIS — I509 Heart failure, unspecified: Secondary | ICD-10-CM | POA: Diagnosis not present

## 2016-11-24 DIAGNOSIS — E119 Type 2 diabetes mellitus without complications: Secondary | ICD-10-CM | POA: Diagnosis not present

## 2016-11-24 DIAGNOSIS — N400 Enlarged prostate without lower urinary tract symptoms: Secondary | ICD-10-CM | POA: Diagnosis not present

## 2016-11-24 DIAGNOSIS — Z79899 Other long term (current) drug therapy: Secondary | ICD-10-CM | POA: Diagnosis not present

## 2016-11-24 DIAGNOSIS — R0602 Shortness of breath: Secondary | ICD-10-CM | POA: Diagnosis not present

## 2016-11-24 DIAGNOSIS — Z85118 Personal history of other malignant neoplasm of bronchus and lung: Secondary | ICD-10-CM | POA: Diagnosis not present

## 2016-11-24 DIAGNOSIS — J449 Chronic obstructive pulmonary disease, unspecified: Secondary | ICD-10-CM | POA: Diagnosis not present

## 2016-11-24 DIAGNOSIS — Z87891 Personal history of nicotine dependence: Secondary | ICD-10-CM | POA: Diagnosis not present

## 2016-11-24 DIAGNOSIS — Z7901 Long term (current) use of anticoagulants: Secondary | ICD-10-CM | POA: Diagnosis not present

## 2016-11-27 DIAGNOSIS — C7931 Secondary malignant neoplasm of brain: Secondary | ICD-10-CM | POA: Diagnosis not present

## 2016-11-27 DIAGNOSIS — Z510 Encounter for antineoplastic radiation therapy: Secondary | ICD-10-CM | POA: Diagnosis not present

## 2016-11-27 DIAGNOSIS — C3411 Malignant neoplasm of upper lobe, right bronchus or lung: Secondary | ICD-10-CM | POA: Diagnosis not present

## 2016-11-28 ENCOUNTER — Inpatient Hospital Stay (HOSPITAL_COMMUNITY)
Admission: EM | Admit: 2016-11-28 | Discharge: 2016-12-03 | DRG: 178 | Disposition: A | Discharge status: Home Health | Payer: Medicare Other | Source: Other Acute Inpatient Hospital | Attending: Internal Medicine | Admitting: Internal Medicine

## 2016-11-28 ENCOUNTER — Other Ambulatory Visit: Payer: Self-pay

## 2016-11-28 ENCOUNTER — Encounter (HOSPITAL_COMMUNITY): Payer: Self-pay

## 2016-11-28 DIAGNOSIS — F17210 Nicotine dependence, cigarettes, uncomplicated: Secondary | ICD-10-CM | POA: Diagnosis present

## 2016-11-28 DIAGNOSIS — J1529 Pneumonia due to other staphylococcus: Secondary | ICD-10-CM | POA: Diagnosis not present

## 2016-11-28 DIAGNOSIS — F329 Major depressive disorder, single episode, unspecified: Secondary | ICD-10-CM | POA: Diagnosis present

## 2016-11-28 DIAGNOSIS — Y95 Nosocomial condition: Secondary | ICD-10-CM | POA: Diagnosis present

## 2016-11-28 DIAGNOSIS — E119 Type 2 diabetes mellitus without complications: Secondary | ICD-10-CM | POA: Diagnosis present

## 2016-11-28 DIAGNOSIS — Z9221 Personal history of antineoplastic chemotherapy: Secondary | ICD-10-CM

## 2016-11-28 DIAGNOSIS — I10 Essential (primary) hypertension: Secondary | ICD-10-CM | POA: Diagnosis present

## 2016-11-28 DIAGNOSIS — C3411 Malignant neoplasm of upper lobe, right bronchus or lung: Secondary | ICD-10-CM | POA: Diagnosis not present

## 2016-11-28 DIAGNOSIS — D696 Thrombocytopenia, unspecified: Secondary | ICD-10-CM | POA: Diagnosis not present

## 2016-11-28 DIAGNOSIS — D638 Anemia in other chronic diseases classified elsewhere: Secondary | ICD-10-CM | POA: Diagnosis present

## 2016-11-28 DIAGNOSIS — I482 Chronic atrial fibrillation: Secondary | ICD-10-CM | POA: Diagnosis not present

## 2016-11-28 DIAGNOSIS — C7931 Secondary malignant neoplasm of brain: Secondary | ICD-10-CM | POA: Diagnosis present

## 2016-11-28 DIAGNOSIS — I509 Heart failure, unspecified: Secondary | ICD-10-CM | POA: Diagnosis not present

## 2016-11-28 DIAGNOSIS — E1169 Type 2 diabetes mellitus with other specified complication: Secondary | ICD-10-CM | POA: Diagnosis not present

## 2016-11-28 DIAGNOSIS — Z7951 Long term (current) use of inhaled steroids: Secondary | ICD-10-CM | POA: Diagnosis not present

## 2016-11-28 DIAGNOSIS — I420 Dilated cardiomyopathy: Secondary | ICD-10-CM | POA: Diagnosis not present

## 2016-11-28 DIAGNOSIS — N400 Enlarged prostate without lower urinary tract symptoms: Secondary | ICD-10-CM | POA: Diagnosis not present

## 2016-11-28 DIAGNOSIS — K449 Diaphragmatic hernia without obstruction or gangrene: Secondary | ICD-10-CM | POA: Diagnosis not present

## 2016-11-28 DIAGNOSIS — E669 Obesity, unspecified: Secondary | ICD-10-CM

## 2016-11-28 DIAGNOSIS — Z7982 Long term (current) use of aspirin: Secondary | ICD-10-CM | POA: Diagnosis not present

## 2016-11-28 DIAGNOSIS — Z79891 Long term (current) use of opiate analgesic: Secondary | ICD-10-CM | POA: Diagnosis not present

## 2016-11-28 DIAGNOSIS — K7469 Other cirrhosis of liver: Secondary | ICD-10-CM | POA: Diagnosis not present

## 2016-11-28 DIAGNOSIS — I4892 Unspecified atrial flutter: Secondary | ICD-10-CM | POA: Diagnosis not present

## 2016-11-28 DIAGNOSIS — Z9981 Dependence on supplemental oxygen: Secondary | ICD-10-CM | POA: Diagnosis not present

## 2016-11-28 DIAGNOSIS — Z825 Family history of asthma and other chronic lower respiratory diseases: Secondary | ICD-10-CM | POA: Diagnosis not present

## 2016-11-28 DIAGNOSIS — Z85118 Personal history of other malignant neoplasm of bronchus and lung: Secondary | ICD-10-CM

## 2016-11-28 DIAGNOSIS — Z923 Personal history of irradiation: Secondary | ICD-10-CM

## 2016-11-28 DIAGNOSIS — B9689 Other specified bacterial agents as the cause of diseases classified elsewhere: Secondary | ICD-10-CM | POA: Diagnosis not present

## 2016-11-28 DIAGNOSIS — J189 Pneumonia, unspecified organism: Secondary | ICD-10-CM | POA: Diagnosis present

## 2016-11-28 DIAGNOSIS — R0789 Other chest pain: Secondary | ICD-10-CM | POA: Diagnosis not present

## 2016-11-28 DIAGNOSIS — Z7901 Long term (current) use of anticoagulants: Secondary | ICD-10-CM | POA: Diagnosis not present

## 2016-11-28 DIAGNOSIS — J151 Pneumonia due to Pseudomonas: Secondary | ICD-10-CM | POA: Diagnosis not present

## 2016-11-28 DIAGNOSIS — R7881 Bacteremia: Secondary | ICD-10-CM | POA: Diagnosis not present

## 2016-11-28 DIAGNOSIS — Z794 Long term (current) use of insulin: Secondary | ICD-10-CM | POA: Diagnosis not present

## 2016-11-28 DIAGNOSIS — Z885 Allergy status to narcotic agent status: Secondary | ICD-10-CM

## 2016-11-28 DIAGNOSIS — I959 Hypotension, unspecified: Secondary | ICD-10-CM | POA: Diagnosis present

## 2016-11-28 DIAGNOSIS — J449 Chronic obstructive pulmonary disease, unspecified: Secondary | ICD-10-CM | POA: Diagnosis present

## 2016-11-28 DIAGNOSIS — K08409 Partial loss of teeth, unspecified cause, unspecified class: Secondary | ICD-10-CM | POA: Diagnosis not present

## 2016-11-28 DIAGNOSIS — C3490 Malignant neoplasm of unspecified part of unspecified bronchus or lung: Secondary | ICD-10-CM | POA: Diagnosis not present

## 2016-11-28 DIAGNOSIS — J441 Chronic obstructive pulmonary disease with (acute) exacerbation: Secondary | ICD-10-CM | POA: Diagnosis not present

## 2016-11-28 DIAGNOSIS — Z87891 Personal history of nicotine dependence: Secondary | ICD-10-CM

## 2016-11-28 DIAGNOSIS — I4891 Unspecified atrial fibrillation: Secondary | ICD-10-CM | POA: Diagnosis present

## 2016-11-28 DIAGNOSIS — Z801 Family history of malignant neoplasm of trachea, bronchus and lung: Secondary | ICD-10-CM | POA: Diagnosis not present

## 2016-11-28 DIAGNOSIS — I480 Paroxysmal atrial fibrillation: Secondary | ICD-10-CM | POA: Diagnosis not present

## 2016-11-28 DIAGNOSIS — I110 Hypertensive heart disease with heart failure: Secondary | ICD-10-CM | POA: Diagnosis not present

## 2016-11-28 DIAGNOSIS — R002 Palpitations: Secondary | ICD-10-CM | POA: Diagnosis not present

## 2016-11-28 DIAGNOSIS — Z902 Acquired absence of lung [part of]: Secondary | ICD-10-CM | POA: Diagnosis not present

## 2016-11-28 DIAGNOSIS — J440 Chronic obstructive pulmonary disease with acute lower respiratory infection: Secondary | ICD-10-CM | POA: Diagnosis not present

## 2016-11-28 DIAGNOSIS — K7460 Unspecified cirrhosis of liver: Secondary | ICD-10-CM | POA: Diagnosis not present

## 2016-11-28 DIAGNOSIS — Z79899 Other long term (current) drug therapy: Secondary | ICD-10-CM | POA: Diagnosis not present

## 2016-11-28 DIAGNOSIS — Z8249 Family history of ischemic heart disease and other diseases of the circulatory system: Secondary | ICD-10-CM | POA: Diagnosis not present

## 2016-11-28 DIAGNOSIS — R0602 Shortness of breath: Secondary | ICD-10-CM | POA: Diagnosis not present

## 2016-11-28 DIAGNOSIS — Z95828 Presence of other vascular implants and grafts: Secondary | ICD-10-CM | POA: Diagnosis not present

## 2016-11-28 DIAGNOSIS — J209 Acute bronchitis, unspecified: Secondary | ICD-10-CM | POA: Diagnosis not present

## 2016-11-28 DIAGNOSIS — I481 Persistent atrial fibrillation: Secondary | ICD-10-CM | POA: Diagnosis not present

## 2016-11-28 DIAGNOSIS — M1990 Unspecified osteoarthritis, unspecified site: Secondary | ICD-10-CM | POA: Diagnosis present

## 2016-11-28 DIAGNOSIS — I5042 Chronic combined systolic (congestive) and diastolic (congestive) heart failure: Secondary | ICD-10-CM | POA: Diagnosis not present

## 2016-11-28 DIAGNOSIS — Z886 Allergy status to analgesic agent status: Secondary | ICD-10-CM | POA: Diagnosis not present

## 2016-11-28 NOTE — Progress Notes (Signed)
Paged admissions for MD orders, patient has right chest port- will consult IV team.

## 2016-11-28 NOTE — Progress Notes (Signed)
New Admission Note:   Arrival Method: EMS from [facility] Orientation: A&Ox4 Telemetry: initiated, verified, a.fib Skin: right chest port a cath Pain: denies  Orders to be reviewed and implemented. Will continue to monitor the patient. Call light has been placed within reach and bed alarm has been activated.   Riki Altes, RN Phone: [phone]

## 2016-11-29 ENCOUNTER — Ambulatory Visit (HOSPITAL_COMMUNITY): Payer: Medicare Other

## 2016-11-29 DIAGNOSIS — R002 Palpitations: Secondary | ICD-10-CM

## 2016-11-29 DIAGNOSIS — E1169 Type 2 diabetes mellitus with other specified complication: Secondary | ICD-10-CM

## 2016-11-29 DIAGNOSIS — I4891 Unspecified atrial fibrillation: Secondary | ICD-10-CM

## 2016-11-29 DIAGNOSIS — J449 Chronic obstructive pulmonary disease, unspecified: Secondary | ICD-10-CM

## 2016-11-29 DIAGNOSIS — C3490 Malignant neoplasm of unspecified part of unspecified bronchus or lung: Secondary | ICD-10-CM

## 2016-11-29 DIAGNOSIS — I4892 Unspecified atrial flutter: Secondary | ICD-10-CM

## 2016-11-29 DIAGNOSIS — I10 Essential (primary) hypertension: Secondary | ICD-10-CM

## 2016-11-29 DIAGNOSIS — J189 Pneumonia, unspecified organism: Secondary | ICD-10-CM | POA: Diagnosis present

## 2016-11-29 DIAGNOSIS — R0602 Shortness of breath: Secondary | ICD-10-CM

## 2016-11-29 DIAGNOSIS — E669 Obesity, unspecified: Secondary | ICD-10-CM

## 2016-11-29 DIAGNOSIS — R7881 Bacteremia: Secondary | ICD-10-CM | POA: Diagnosis present

## 2016-11-29 DIAGNOSIS — R0789 Other chest pain: Secondary | ICD-10-CM

## 2016-11-29 HISTORY — DX: Bacteremia: R78.81

## 2016-11-29 LAB — GLUCOSE, CAPILLARY
Glucose-Capillary: 131 mg/dL — ABNORMAL HIGH (ref 65–99)
Glucose-Capillary: 135 mg/dL — ABNORMAL HIGH (ref 65–99)
Glucose-Capillary: 144 mg/dL — ABNORMAL HIGH (ref 65–99)
Glucose-Capillary: 157 mg/dL — ABNORMAL HIGH (ref 65–99)
Glucose-Capillary: 188 mg/dL — ABNORMAL HIGH (ref 65–99)
Glucose-Capillary: 214 mg/dL — ABNORMAL HIGH (ref 65–99)

## 2016-11-29 LAB — STREP PNEUMONIAE URINARY ANTIGEN: Strep Pneumo Urinary Antigen: NEGATIVE

## 2016-11-29 LAB — BASIC METABOLIC PANEL
Anion gap: 6 (ref 5–15)
BUN: 13 mg/dL (ref 6–20)
CO2: 28 mmol/L (ref 22–32)
Calcium: 8.9 mg/dL (ref 8.9–10.3)
Chloride: 103 mmol/L (ref 101–111)
Creatinine, Ser: 0.97 mg/dL (ref 0.61–1.24)
GFR calc Af Amer: >60 mL/min (ref >60)
GFR calc non Af Amer: >60 mL/min (ref >60)
Glucose, Bld: 153 mg/dL — ABNORMAL HIGH (ref 65–99)
Potassium: 3.8 mmol/L (ref 3.5–5.1)
Sodium: 137 mmol/L (ref 135–145)

## 2016-11-29 LAB — CBC
HCT: 32.8 % — ABNORMAL LOW (ref 39.0–52.0)
HCT: 33.8 % — ABNORMAL LOW (ref 39.0–52.0)
Hemoglobin: 10.4 g/dL — ABNORMAL LOW (ref 13.0–17.0)
Hemoglobin: 10.7 g/dL — ABNORMAL LOW (ref 13.0–17.0)
MCH: 32.5 pg (ref 26.0–34.0)
MCH: 32.6 pg (ref 26.0–34.0)
MCHC: 31.7 g/dL (ref 30.0–36.0)
MCHC: 31.7 g/dL (ref 30.0–36.0)
MCV: 102.5 fL — ABNORMAL HIGH (ref 78.0–100.0)
MCV: 103 fL — ABNORMAL HIGH (ref 78.0–100.0)
Platelets: 141 10*3/uL — ABNORMAL LOW (ref 150–400)
Platelets: 145 10*3/uL — ABNORMAL LOW (ref 150–400)
RBC: 3.2 MIL/uL — ABNORMAL LOW (ref 4.22–5.81)
RBC: 3.28 MIL/uL — ABNORMAL LOW (ref 4.22–5.81)
RDW: 14.6 % (ref 11.5–15.5)
RDW: 14.9 % (ref 11.5–15.5)
WBC: 5 10*3/uL (ref 4.0–10.5)
WBC: 6.6 10*3/uL (ref 4.0–10.5)

## 2016-11-29 LAB — COMPREHENSIVE METABOLIC PANEL
ALT: 23 U/L (ref 17–63)
AST: 22 U/L (ref 15–41)
Albumin: 2.8 g/dL — ABNORMAL LOW (ref 3.5–5.0)
Alkaline Phosphatase: 47 U/L (ref 38–126)
Anion gap: 7 (ref 5–15)
BUN: 15 mg/dL (ref 6–20)
CO2: 27 mmol/L (ref 22–32)
Calcium: 8.9 mg/dL (ref 8.9–10.3)
Chloride: 102 mmol/L (ref 101–111)
Creatinine, Ser: 1.04 mg/dL (ref 0.61–1.24)
GFR calc Af Amer: >60 mL/min (ref >60)
GFR calc non Af Amer: >60 mL/min (ref >60)
Glucose, Bld: 197 mg/dL — ABNORMAL HIGH (ref 65–99)
Potassium: 4.1 mmol/L (ref 3.5–5.1)
Sodium: 136 mmol/L (ref 135–145)
Total Bilirubin: 0.6 mg/dL (ref 0.3–1.2)
Total Protein: 5.2 g/dL — ABNORMAL LOW (ref 6.5–8.1)

## 2016-11-29 LAB — MAGNESIUM: Magnesium: 1.6 mg/dL — ABNORMAL LOW (ref 1.7–2.4)

## 2016-11-29 LAB — EXPECTORATED SPUTUM ASSESSMENT W GRAM STAIN, RFLX TO RESP C

## 2016-11-29 LAB — TROPONIN I: Troponin I: <0.03 ng/mL (ref <0.03)

## 2016-11-29 MED ORDER — CEFEPIME HCL 1 G IJ SOLR
1.0000 g | Freq: Three times a day (TID) | INTRAMUSCULAR | Status: DC
  Administered 2016-11-29 – 2016-12-03 (×14): 1 g via INTRAVENOUS
  Filled 2016-11-29 (×16): qty 1

## 2016-11-29 MED ORDER — SODIUM CHLORIDE 0.9% FLUSH
10.0000 mL | INTRAVENOUS | Status: DC | PRN
  Administered 2016-12-03: 10 mL
  Filled 2016-11-29: qty 40

## 2016-11-29 MED ORDER — TAMSULOSIN HCL 0.4 MG PO CAPS
0.4000 mg | ORAL_CAPSULE | Freq: Every day | ORAL | Status: DC
  Administered 2016-11-29 – 2016-12-03 (×5): 0.4 mg via ORAL
  Filled 2016-11-29 (×5): qty 1

## 2016-11-29 MED ORDER — LEVALBUTEROL HCL 0.63 MG/3ML IN NEBU
0.6300 mg | INHALATION_SOLUTION | Freq: Four times a day (QID) | RESPIRATORY_TRACT | Status: DC | PRN
  Administered 2016-11-29 – 2016-12-03 (×5): 0.63 mg via RESPIRATORY_TRACT
  Filled 2016-11-29 (×7): qty 3

## 2016-11-29 MED ORDER — ASPIRIN EC 81 MG PO TBEC
81.0000 mg | DELAYED_RELEASE_TABLET | Freq: Every day | ORAL | Status: DC
  Administered 2016-11-29 – 2016-12-01 (×3): 81 mg via ORAL
  Filled 2016-11-29 (×3): qty 1

## 2016-11-29 MED ORDER — SODIUM CHLORIDE 0.9% FLUSH
10.0000 mL | Freq: Two times a day (BID) | INTRAVENOUS | Status: DC
  Administered 2016-11-29 – 2016-12-02 (×3): 10 mL

## 2016-11-29 MED ORDER — DOFETILIDE 250 MCG PO CAPS
250.0000 ug | ORAL_CAPSULE | Freq: Two times a day (BID) | ORAL | Status: DC
  Filled 2016-11-29: qty 1

## 2016-11-29 MED ORDER — FUROSEMIDE 40 MG PO TABS
40.0000 mg | ORAL_TABLET | Freq: Every day | ORAL | Status: DC
  Administered 2016-11-29 – 2016-12-01 (×3): 40 mg via ORAL
  Filled 2016-11-29 (×3): qty 1

## 2016-11-29 MED ORDER — INSULIN ASPART 100 UNIT/ML ~~LOC~~ SOLN
0.0000 [IU] | SUBCUTANEOUS | Status: DC
  Administered 2016-11-29: 2 [IU] via SUBCUTANEOUS
  Administered 2016-11-29: 3 [IU] via SUBCUTANEOUS
  Administered 2016-11-29 (×2): 2 [IU] via SUBCUTANEOUS
  Administered 2016-11-30: 15 [IU] via SUBCUTANEOUS
  Administered 2016-11-30: 3 [IU] via SUBCUTANEOUS
  Administered 2016-11-30 (×2): 5 [IU] via SUBCUTANEOUS
  Administered 2016-11-30: 2 [IU] via SUBCUTANEOUS
  Administered 2016-12-01: 3 [IU] via SUBCUTANEOUS
  Administered 2016-12-01: 2 [IU] via SUBCUTANEOUS
  Administered 2016-12-01: 5 [IU] via SUBCUTANEOUS
  Administered 2016-12-01: 2 [IU] via SUBCUTANEOUS
  Administered 2016-12-02 (×2): 3 [IU] via SUBCUTANEOUS
  Administered 2016-12-02: 2 [IU] via SUBCUTANEOUS
  Administered 2016-12-02: 3 [IU] via SUBCUTANEOUS
  Administered 2016-12-02: 2 [IU] via SUBCUTANEOUS
  Administered 2016-12-03: 3 [IU] via SUBCUTANEOUS
  Administered 2016-12-03: 8 [IU] via SUBCUTANEOUS

## 2016-11-29 MED ORDER — GABAPENTIN 300 MG PO CAPS
300.0000 mg | ORAL_CAPSULE | Freq: Three times a day (TID) | ORAL | Status: DC
  Administered 2016-11-29 – 2016-12-03 (×14): 300 mg via ORAL
  Filled 2016-11-29 (×15): qty 1

## 2016-11-29 MED ORDER — INSULIN GLARGINE 100 UNIT/ML ~~LOC~~ SOLN
25.0000 [IU] | Freq: Two times a day (BID) | SUBCUTANEOUS | Status: DC
  Administered 2016-11-29 – 2016-12-03 (×9): 25 [IU] via SUBCUTANEOUS
  Filled 2016-11-29 (×10): qty 0.25

## 2016-11-29 MED ORDER — METOPROLOL TARTRATE 50 MG PO TABS
50.0000 mg | ORAL_TABLET | Freq: Two times a day (BID) | ORAL | Status: DC
  Administered 2016-11-29 – 2016-12-02 (×7): 50 mg via ORAL
  Filled 2016-11-29 (×8): qty 1

## 2016-11-29 MED ORDER — SODIUM CHLORIDE 0.9 % IV SOLN
1250.0000 mg | Freq: Two times a day (BID) | INTRAVENOUS | Status: DC
  Administered 2016-11-29 – 2016-12-03 (×10): 1250 mg via INTRAVENOUS
  Filled 2016-11-29 (×12): qty 1250

## 2016-11-29 MED ORDER — ACETAMINOPHEN 325 MG PO TABS: 650.0000 mg | ORAL_TABLET | ORAL | Status: DC | PRN

## 2016-11-29 MED ORDER — ONDANSETRON HCL 4 MG/2ML IJ SOLN: 4.0000 mg | Freq: Four times a day (QID) | INTRAMUSCULAR | Status: DC | PRN

## 2016-11-29 MED ORDER — LEVALBUTEROL HCL 0.63 MG/3ML IN NEBU
0.6300 mg | INHALATION_SOLUTION | Freq: Four times a day (QID) | RESPIRATORY_TRACT | Status: DC
  Administered 2016-11-29 – 2016-12-03 (×16): 0.63 mg via RESPIRATORY_TRACT
  Filled 2016-11-29 (×16): qty 3

## 2016-11-29 MED ORDER — RIVAROXABAN 20 MG PO TABS
20.0000 mg | ORAL_TABLET | Freq: Every day | ORAL | Status: DC
  Administered 2016-11-29 – 2016-12-03 (×5): 20 mg via ORAL
  Filled 2016-11-29 (×5): qty 1

## 2016-11-29 MED ORDER — DILTIAZEM HCL ER COATED BEADS 360 MG PO CP24
360.0000 mg | ORAL_CAPSULE | Freq: Every day | ORAL | Status: DC
  Administered 2016-11-29 – 2016-11-30 (×2): 360 mg via ORAL
  Filled 2016-11-29: qty 1
  Filled 2016-11-29 (×2): qty 2
  Filled 2016-11-29: qty 1

## 2016-11-29 MED ORDER — RIVAROXABAN 15 MG PO TABS
15.0000 mg | ORAL_TABLET | Freq: Every day | ORAL | Status: DC
  Filled 2016-11-29: qty 1

## 2016-11-29 MED ORDER — OXYCODONE-ACETAMINOPHEN 5-325 MG PO TABS
1.0000 | ORAL_TABLET | ORAL | Status: DC | PRN
  Administered 2016-11-29 – 2016-12-02 (×5): 2 via ORAL
  Filled 2016-11-29 (×5): qty 2

## 2016-11-29 MED ORDER — GUAIFENESIN ER 600 MG PO TB12
600.0000 mg | ORAL_TABLET | Freq: Two times a day (BID) | ORAL | Status: DC
  Administered 2016-11-29 – 2016-12-03 (×9): 600 mg via ORAL
  Filled 2016-11-29 (×9): qty 1

## 2016-11-29 MED ORDER — BUDESONIDE 0.5 MG/2ML IN SUSP
0.5000 mg | Freq: Two times a day (BID) | RESPIRATORY_TRACT | Status: DC
  Administered 2016-11-29 – 2016-12-03 (×9): 0.5 mg via RESPIRATORY_TRACT
  Filled 2016-11-29 (×9): qty 2

## 2016-11-29 NOTE — Progress Notes (Signed)
Patient says he is having chest pain 2/10, wanted PRN percocet. Gave percocet and increased oxygen to 3L. Patient is now saying he feels better.

## 2016-11-29 NOTE — Consult Note (Signed)
Cardiology Consultation:   Patient ID: Chase Herrera; [id number]; 15-Oct-1950   Admit date: 11/28/2016 Date of Consult: 11/29/2016  Primary Care Provider: Neale Burly, MD Primary Cardiologist: Dr. [name] Primary Electrophysiologist:  Dr. [name] (2013)   Patient Profile:   Chase Herrera is a 66 y.o. male with a hx of AFlutter ablated 2013, developed AFib?, severe O2 dependent COPD, hx of RUL small cell cancer (s/p Rad Tx/chemo, patient states completed therapy), he reports unable to have surgery 2/2 severe COPD, quit smoking 3 years ago, DM, HTN, who is being seen today for the evaluation of AFib at the request of Dr. Rodena Piety.  History of Present Illness:   Chase Herrera initially sought attention [facility] for a couple days of SOB, palpitations, eventually chest discomfort, he was found in AFlutter RVR, there was concern for STEMI thougt record indicates in d/w STEMI MD here, not felt to be and recommended transfer when bed available given RVR.  [facility] record indicates he was hospitalized there last week for pneumonia and on antibotics at home,  He was hospitalized a week or so prior to that for racing heart rhythm, at that time, he reports is when he was started on Tikosyn, he reports last dose of Tikosyn was last night at [facility] just prior to transfer.  Was held this AM 2/2 electrolytes.   [facility] note dated 11/27/16 mentions brain METS (patient denies this, states low dose rad tx to his brain was prophylactic)  EP service was called to help with rate/rhythm management.  Discussed with cardiology, no plans for ischemic w/u or follow up from their service, and asked EP service manage rhythm.  The patient tells me had had been doing fairly well, at least at his baseline until he woke around 2AM feeling SOB, did a breathing treatment without relief and drove himself to the hospital developed some chest discomfort en-route, no sensation of palpitations. He is feeling a bit better today, no  active chest discomfort, feels congested, coughing, more so when laying down.   LABS K+ 3.8 Mag 1.6 BUN/Creat 13/0.98 WBC 5.0 H/H 10/33Plts 145 Trop <0.03  [facility]: BC x2 + for gram + cocci Trop T: <0.01 CXR infiltrate or atelectasis L lung base and RUL may idicate multifocal pneumonia  Current rate controlling meds Diltiazem 360mg  daily Metoprolol 50mg  BID   Past Medical History:  Diagnosis Date  . Alcohol abuse    Quit 08/23/11  . Arthritis   . Atrial fibrillation ([place])   . Atrial flutter ([place])    CTI ablation by Dr [name] 10/2011  . COPD (chronic obstructive pulmonary disease) ([place])   . Depression   . Essential hypertension   . History of cardiomyopathy    LVEF 25-30% 08/2011 with subsequent normalization  . Obesity   . Prostate enlargement   . Small cell lung cancer ([place])    Right upper lobe - follows [facility]  . Type 2 diabetes mellitus ([place])     Past Surgical History:  Procedure Laterality Date  . APPENDECTOMY    . ATRIAL ABLATION SURGERY  11/06/2011   CTI ablation for atrial flutter by Dr [name]  . Cataracts    . IR GENERIC HISTORICAL  04/09/2016   IR US GUIDE VASC ACCESS RIGHT 04/09/2016 [name], DO [facility]-INTERV RAD  . IR GENERIC HISTORICAL  04/09/2016   IR FLUORO GUIDE PORT INSERTION RIGHT 04/09/2016 [name], DO [facility]-INTER[facility]  . TONSILLECTOMY         Inpatient  Medications: Scheduled Meds: . aspirin EC  81 mg Oral Daily  . budesonide (PULMICORT) nebulizer solution  0.5 mg Nebulization BID  . diltiazem  360 mg Oral Daily  . dofetilide  250 mcg Oral BID  . furosemide  40 mg Oral Daily  . gabapentin  300 mg Oral TID  . guaiFENesin  600 mg Oral BID  . insulin aspart  0-15 Units Subcutaneous Q4H  . insulin glargine  25 Units Subcutaneous BID  . metoprolol tartrate  50 mg Oral BID  . rivaroxaban  20 mg Oral Q supper  . sodium chloride flush  10-40 mL Intracatheter Q12H  . tamsulosin  0.4 mg Oral QPC supper   Continuous  Infusions: . ceFEPime (MAXIPIME) IV Stopped (11/29/16 0554)  . vancomycin 1,250 mg (11/29/16 0522)   PRN Meds: acetaminophen, levalbuterol, ondansetron (ZOFRAN) IV, oxyCODONE-acetaminophen, sodium chloride flush  Allergies:    Allergies  Allergen Reactions  . Codeine Other (See Comments) and Itching    Social History:   Social History   Socioeconomic History  . Marital status: Single    Spouse name: Not on file  . Number of children: Not on file  . Years of education: Not on file  . Highest education level: Not on file  Social Needs  . Financial resource strain: Somewhat hard  . Food insecurity - worry: Patient refused  . Food insecurity - inability: Patient refused  . Transportation needs - medical: Patient refused  . Transportation needs - non-medical: Patient refused  Occupational History  . Occupation: retired     Comment: Building services engineer  Tobacco Use  . Smoking status: Former Smoker    Packs/day: 0.50    Years: 47.00    Pack years: 23.50    Types: Cigarettes  . Smokeless tobacco: Former Systems developer    Quit date: 04/10/2014  . Tobacco comment: encouraged to quit today 03/04/12  Substance and Sexual Activity  . Alcohol use: Yes    Comment: quit 2 years ago  . Drug use: No    Comment: marajuana occasionally  . Sexual activity: Not Currently  Other Topics Concern  . Not on file  Social History Narrative   Lives in [place] alone.   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 Diabetes Sister   . Hypertension Mother   . Lung cancer Mother   . Hypertension Father   . Heart attack Father   . Hypertension Sister   . Hypertension Sister   . Hypertension Sister   . Pulmonary fibrosis Sister      ROS:  Please see the history of present illness.  ROS  All other ROS reviewed and negative.     Physical Exam/Data:   Vitals:   11/29/16 0322 11/29/16 0527 11/29/16 0700  11/29/16 0926  BP: 120/76  111/78   Pulse: 94  (!) 114 (!) 111  Resp: 20  20   Temp: 99.1 F (37.3 C)  98.4 F (36.9 C)   TempSrc: Oral  Oral   SpO2: 95%  98%   Weight:  271 lb 4.8 oz (123.1 kg)    Height:        Intake/Output Summary (Last 24 hours) at 11/29/2016 1125 Last data filed at 11/29/2016 0700 Gross per 24 hour  Intake 640 ml  Output 350 ml  Net 290 ml   Filed Weights   11/28/16  2250 11/29/16 0527  Weight: 273 lb 3.2 oz (123.9 kg) 271 lb 4.8 oz (123.1 kg)   Body mass index is 36.79 kg/m.  General:  Well nourished, well developed, in no acute distress, looks much older then his age 25: normal Lymph: no adenopathy Neck: no JVD Endocrine:  No thryomegaly Vascular: No carotid bruits  Cardiac:  iRRR; no murmurs, gallops or rubs Lungs:  Soft scattered ronchi, no wheezing Abd: soft, nontender, no hepatomegaly  Ext: no edema Musculoskeletal:  No deformities Skin: warm and dry  Neuro:  no focal abnormalities noted Psych:  Normal affect   EKG:  The EKG was personally reviewed and demonstrates:   Here: AFlutter (atypical), 93bpm [facility] chart: AFlutter 167bpm >> 115bpm Telemetry:  Telemetry was personally reviewed and demonstrates:   AFlutter, 80's-110  Relevant CV Studies:  11/15/16: TTE Study Conclusions - Left ventricle: The cavity size was normal. Wall thickness was   normal. Systolic function was normal. The estimated ejection   fraction was in the range of 60% to 65%. Left ventricular   diastolic function parameters were normal. - Aortic valve: Valve area (VTI): 3.65 cm^2. Valve area (Vmax):   3.42 cm^2. Valve area (Vmean): 3.4 cm^2.   Laboratory Data:  Chemistry Recent Labs  Lab 11/28/16 2349 11/29/16 0613  NA 136 137  K 4.1 3.8  CL 102 103  CO2 27 28  GLUCOSE 197* 153*  BUN 15 13  CREATININE 1.04 0.97  CALCIUM 8.9 8.9  GFRNONAA >60 >60  GFRAA >60 >60  ANIONGAP 7 6    Recent Labs  Lab 11/28/16 2349  PROT 5.2*  ALBUMIN 2.8*    AST 22  ALT 23  ALKPHOS 47  BILITOT 0.6   Hematology Recent Labs  Lab 11/28/16 2349 11/29/16 0613  WBC 6.6 5.0  RBC 3.20* 3.28*  HGB 10.4* 10.7*  HCT 32.8* 33.8*  MCV 102.5* 103.0*  MCH 32.5 32.6  MCHC 31.7 31.7  RDW 14.6 14.9  PLT 141* 145*   Cardiac Enzymes Recent Labs  Lab 11/28/16 2349  TROPONINI <0.03   No results for input(s): TROPIPOC in the last 168 hours.  BNPNo results for input(s): BNP, PROBNP in the last 168 hours.  DDimer No results for input(s): DDIMER in the last 168 hours.  Radiology/Studies:  No results found.  Assessment and Plan:   1.  AFlutter, atypical      Hx of flutter ablation, record indicates subsequently has had AFib      CHA2DS2Vasc is at least 2, on Xarelto appropriately dosed here at 20mg        The patient out patient was on Xarelto 15mg , in review of his labs, this is under-dosed with Creat Clearance routinely 100 or better He is on Tikosyn 250mg  BID out patient, missed this AM, uncertain how long he has been out of rhythm, there is an EKG on his chart with AFlutter dated 11/24/16 w/CVR  Given he was not adequately a/c on low dose xarelto and likely has been in AF for some time, concerned about converting him medically or w/DCCV without TEE, will stop Tiksoyn for now  Rate control for now, HR are much improved, generally 90's-100, up to 110 Dr. [name] to see   Continue with other multiple medical problems with medicine service   2. + blood cultures at [facility] 2/2 with gram + cocci      (despite reports of being actively on antibiotics for a recent pneumonia)     Repeat pending  C/w IM service 3. Severe COPD     Hx of lung Ca treated, ??brain METS 4. Lung Cancer s/p Rad and chemo 5. HTN 6. cxr doesnot suggest pulmonary edema     +edema     PO lasix was ordered    For questions or updates, please contact [facility] Please consult [email or web address] for contact info under Cardiology/STEMI.   Signed, Baldwin Jamaica,  PA-C  11/29/2016 11:25 AM   I have seen, examined the patient, and reviewed the above assessment and plan.  Changes to above are made where necessary.  On exam, morbidly obese and chronically ill.  Multiple comorbidities as outlined above including lung CA and severe lung disease. ekgs reviewed from [facility] system reveal typical appearing atrial flutter 10/09/16 and atypical flutter (possibly clockwise tricuspid rotation) today.  Records from [facility] suggest afib also.  I suspect that his atrial arrhythmias are driven by underlying lung disease.  I worry that our ability to maintain sinus rhythm long term is low.  As he has recently been underdosed on xarelto, I do not feel that we can pursue sinus rhythm acutely.  I will increase xarelto to 20mg  daily.  Continue diltiazem for rate control.  As he appears to have failed medical therapy with [facility], our options are limited.  We may have to consider amiodarone though given his lung disease, I would like to avoid this if possible.  Will start with rate control and adjust as clinical required.   Co Sign: Thompson Grayer, MD 11/29/2016

## 2016-11-29 NOTE — Consult Note (Signed)
CARDIOLOGY CONSULT NOTE   Referring Physician: [facility] Primary Cardiologist: Dr. Jenkins Rouge Reason for Consultation: Atrial arrhythmia   HPI: Chase Herrera is a 66 y.o. male w/ a history of SCLC s/p lobectomy/chemo, currently undergoing chest XRT, smoking, oxygen dependent COPD, atrial flutter (s/p ablation 2014) who presents for palpitations, shortness of breath, and chest discomfort.   The patient reports that he developed symptoms of shortness of breath 2 days ago, similar to his symptoms of shortness of breath that he has frequently experienced in the past. He starting using his breathing treatment, which he does routinely about 4 times daily, at which time he began experiencing palpitations and chest discomfort. He thus went to the ED at [facility]. On arrival an ECG revealed atrial flutter, though there was concern that he could be having a STEMI. The [facility] cardiology service was consulted (24 hours ago) and did not believe that the patient was having a STEMI, but recommended transfer to [facility]. Due to bed flow issues, the patient did not arrive until 24 hours later.  On arrival to [facility], the patient reports that his shortness of breath, palpitations, and chest discomfort had all resolved. He endorsed frustration that he had been experiencing these symptoms intermittently for quite some time now and feels as though he is always in the hospital from this. He reports having recently been started on Dofetilide and reports not having missed any doses of any of his medications. He cannot identify any triggers for his symptoms, however it seems as though his breathing treatments may exacerbate his symptoms.   Review of Systems:     Cardiac Review of Systems: {Y] = yes [ ]  = no  Chest Pain [  Y  ]  Resting SOB [ Y  ] Exertional SOB  [ Y ]  Orthopnea [  ]   Pedal Edema [ Y  ]    Palpitations [ Y ] Syncope  [  ]   Presyncope [   ]  General Review of Systems:  [Y] = yes [  ]=no Constitional: recent weight change [  ]; anorexia [  ]; fatigue [  ]; nausea [  ]; night sweats [  ]; fever [  ]; or chills [  ];                                                                     Eyes : blurred vision [  ]; diplopia [   ]; vision changes [  ];  Amaurosis fugax[  ]; Resp: cough [  ];  wheezing[  ];  hemoptysis[  ];  PND [  ];  GI:  gallstones[  ], vomiting[  ];  dysphagia[  ]; melena[  ];  hematochezia [  ]; heartburn[  ];   GU: kidney stones [  ]; hematuria[  ];   dysuria [  ];  nocturia[  ]; incontinence [  ];             Skin: rash, swelling[  ];, hair loss[  ];  peripheral edema[  ];  or itching[  ]; Musculosketetal: myalgias[  ];  joint swelling[  ];  joint erythema[  ];  joint pain[  ];  back pain[  ];  Heme/Lymph: bruising[  ];  bleeding[  ];  anemia[  ];  Neuro: TIA[  ];  headaches[  ];  stroke[  ];  vertigo[  ];  seizures[  ];   paresthesias[  ];  difficulty walking[  ];  Psych:depression[  ]; anxiety[  ];  Endocrine: diabetes[  ];  thyroid dysfunction[  ];  Other:  Past Medical History:  Diagnosis Date  . Alcohol abuse    Quit 08/23/11  . Arthritis   . Atrial fibrillation ([place])   . Atrial flutter ([place])    CTI ablation by Dr [name] 10/2011  . COPD (chronic obstructive pulmonary disease) ([place])   . Depression   . Essential hypertension   . History of cardiomyopathy    LVEF 25-30% 08/2011 with subsequent normalization  . Obesity   . Prostate enlargement   . Small cell lung cancer ([place])    Right upper lobe - follows with [facility]  . Type 2 diabetes mellitus ([place])     Medications Prior to Admission  Medication Sig Dispense Refill  . albuterol (PROVENTIL HFA;VENTOLIN HFA) 108 (90 BASE) MCG/ACT inhaler Inhale 2 puffs into the lungs every 6 (six) hours as needed. For shortness of breath    . aspirin EC 81 MG tablet Take 81 mg by mouth daily.    Marland Kitchen diltiazem (TIAZAC) 360 MG 24 hr capsule Take 360 mg by mouth daily.  0  . furosemide  (LASIX) 40 MG tablet Take 40 mg by mouth daily.    Marland Kitchen gabapentin (NEURONTIN) 300 MG capsule Take 300 mg by mouth 3 (three) times daily.     . insulin glargine (LANTUS) 100 UNIT/ML injection Inject 25 Units into the skin 2 (two) times daily.    . insulin lispro (HUMALOG) 100 UNIT/ML injection Inject into the skin. SSI    . LORazepam (ATIVAN) 0.5 MG tablet Take 1 tablet (0.5 mg total) by mouth every 6 (six) hours as needed (Nausea or vomiting). (Patient not taking: Reported on 11/02/2016) 30 tablet 0  . metformin (FORTAMET) 500 MG (OSM) 24 hr tablet Take 1,000 mg by mouth. 1,00mg  am and 500 mg pm    . metoprolol tartrate (LOPRESSOR) 50 MG tablet Take 1 tablet (50 mg total) by mouth 2 (two) times daily. 180 tablet 3  . Multiple Vitamin (MULTIVITAMIN) capsule Take by mouth.    . oxyCODONE-acetaminophen (PERCOCET/ROXICET) 5-325 MG tablet Take 1-2 tablets by mouth every 4 (four) hours as needed for severe pain. 90 tablet 0  . tamsulosin (FLOMAX) 0.4 MG CAPS capsule Take by mouth.       . sodium chloride flush  10-40 mL Intracatheter Q12H    Infusions:   Allergies  Allergen Reactions  . Codeine Other (See Comments) and Itching    Social History   Socioeconomic History  . Marital status: Single    Spouse name: Not on file  . Number of children: Not on file  . Years of education: Not on file  . Highest education level: Not on file  Social Needs  . Financial resource strain: Somewhat hard  . Food insecurity - worry: Patient refused  . Food insecurity - inability: Patient refused  . Transportation needs - medical: Patient refused  . Transportation needs - non-medical: Patient refused  Occupational History  . Occupation: retired     Comment: Building services engineer  Tobacco Use  . Smoking status: Former Smoker    Packs/day: 0.50    Years: 47.00  Pack years: 23.50    Types: Cigarettes  . Smokeless tobacco: Former Systems developer    Quit date: 04/10/2014  . Tobacco comment: encouraged to quit today  03/04/12  Substance and Sexual Activity  . Alcohol use: Yes    Comment: quit 2 years ago  . Drug use: No    Comment: marajuana occasionally  . Sexual activity: Not Currently  Other Topics Concern  . Not on file  Social History Narrative   Lives in [place] alone.   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 Diabetes Sister   . Hypertension Mother   . Lung cancer Mother   . Hypertension Father   . Heart attack Father   . Hypertension Sister   . Hypertension Sister   . Hypertension Sister   . Pulmonary fibrosis Sister     PHYSICAL EXAM: Vitals:   11/28/16 2250  BP: (!) 134/101  Pulse: 94  Resp: 18  Temp: 98.2 F (36.8 C)  SpO2: 92%    No intake or output data in the 24 hours ending 11/29/16 0101  General:  Obese, ill appearing. Mildly tachypneic.  HEENT: normal Neck: supple. no JVD. Carotids 2+ bilat; no bruits. No lymphadenopathy or thryomegaly appreciated. Cor: PMI nondisplaced. Very distant heart sounds. Irregularly irregular rhythm. Normal rate. No rubs, gallops or murmurs. Lungs: clear Abdomen: soft, nontender, nondistended. No hepatosplenomegaly. No bruits or masses. Good bowel sounds. Extremities: no cyanosis, clubbing, rash. 2+ pitting edema in the bilateral ankles and lower legs Neuro: alert & oriented x 3, cranial nerves grossly intact. moves all 4 extremities w/o difficulty. Affect pleasant.  ECG: atrial flutter with variable conduction, rate controlled  Results for orders placed or performed during the hospital encounter of 11/28/16 (from the past 24 hour(s))  CBC     Status: Abnormal   Collection Time: 11/28/16 11:49 PM  Result Value Ref Range   WBC 6.6 4.0 - 10.5 K/uL   RBC 3.20 (L) 4.22 - 5.81 MIL/uL   Hemoglobin 10.4 (L) 13.0 - 17.0 g/dL   HCT 32.8 (L) 39.0 - 52.0 %   MCV 102.5 (H) 78.0 - 100.0 fL   MCH 32.5 26.0 - 34.0 pg   MCHC 31.7 30.0 - 36.0 g/dL   RDW 14.6  11.5 - 15.5 %   Platelets 141 (L) 150 - 400 K/uL  Comprehensive metabolic panel     Status: Abnormal   Collection Time: 11/28/16 11:49 PM  Result Value Ref Range   Sodium 136 135 - 145 mmol/L   Potassium 4.1 3.5 - 5.1 mmol/L   Chloride 102 101 - 111 mmol/L   CO2 27 22 - 32 mmol/L   Glucose, Bld 197 (H) 65 - 99 mg/dL   BUN 15 6 - 20 mg/dL   Creatinine, Ser 1.04 0.61 - 1.24 mg/dL   Calcium 8.9 8.9 - 10.3 mg/dL   Total Protein 5.2 (L) 6.5 - 8.1 g/dL   Albumin 2.8 (L) 3.5 - 5.0 g/dL   AST 22 15 - 41 U/L   ALT 23 17 - 63 U/L   Alkaline Phosphatase 47 38 - 126 U/L   Total Bilirubin 0.6 0.3 - 1.2 mg/dL   GFR calc non Af Amer >60 >60 mL/min   GFR calc Af Amer >60 >60 mL/min   Anion gap 7 5 - 15  Troponin I     Status: None   Collection Time:  11/28/16 11:49 PM  Result Value Ref Range   Troponin I <0.03 <0.03 ng/mL   No results found.   ASSESSMENT: Chase Herrera is a 66 y.o. male w/ a history of SCLC s/p lobectomy/chemo, currently undergoing chest XRT, smoking, oxygen dependent COPD, atrial flutter (s/p ablation 2014) who presents for palpitations, shortness of breath, and chest discomfort. It is difficult to discern whether the patient's underlying lung pathology is leading to worsening of his arrhythmia, or whether the arrhythmia is the primary driver of his symptoms. Either way he clearly has symptoms from his arrhythmia and thus a rhythm control strategy is reasonable. Now that he has received nearly 2 weeks of dofetilide, it would be reasonable to pursue electrical cardioversion. If this strategy fails, an ablation may need to be considered.   Interestingly the patient reports having recently been started on dofetilide, however I do not see this anywhere in his records. He does however see providers at other hospitals that are not within the [facility] record system.   Recommendations: - continue home rivaroxaban - cardioversion +/- TEE - NPO except meds - continue home  diltiazem and metoprolol - try to limit nebulized breathing treatments given beta agonism possibly worsening his arrhythmia - please confirm whether he is taking dofetilide and what his dose is  - if it is confirmed that he is taking dofetilide, and he has not missed any doses of his rivaroxaban in the past 3 weeks, you may continue dofetilide at his home dose - plan for EP evaluation in the AM   Marcie Mowers, MD Cardiology Fellow, PGY-5

## 2016-11-29 NOTE — Progress Notes (Signed)
Pharmacy Antibiotic Note  Chase Herrera is a 66 y.o. male admitted on 11/28/2016 with pneumonia.  Pharmacy has been consulted for Vancomycin/Zosyn dosing. Pt is a transfer from [facility] due to cardiac issues. WBC WNL. Renal function ok. Tmax 99.1.   Plan: Vancomycin 1250 mg IV q12h Cefepime 1g IV q8h per MD Trend WBC, temp, renal function  F/U infectious work-up Drug levels as indicated   Height: 6' (182.9 cm) Weight: 273 lb 3.2 oz (123.9 kg)(scale b) IBW/kg (Calculated) : 77.6  Temp (24hrs), Avg:98.7 F (37.1 C), Min:98.2 F (36.8 C), Max:99.1 F (37.3 C)  Recent Labs  Lab 11/28/16 2349  WBC 6.6  CREATININE 1.04    Estimated Creatinine Clearance: 97.5 mL/min (by C-G formula based on SCr of 1.04 mg/dL).    Allergies  Allergen Reactions  . Codeine Other (See Comments) and Itching    Narda Bonds 11/29/2016 4:27 AM

## 2016-11-29 NOTE — Progress Notes (Signed)
Patient refuses bed alarm and hospital gown. Patient educated.

## 2016-11-29 NOTE — Progress Notes (Signed)
66 yo male with small cell lung cancer history of lobectomy chemotherapy and radiation therapy, O2 dependent COPD, tobacco abuse history of atrial flutter status post ablation on Xarelto admitted with chest pain and shortness of breath.  She is admitted with recurrent atrial flutter, cardiac enzymes negative.  Patient waiting to be seen by cardiology for possible TEE and cardioversion.  Chest x-ray was consistent with right lower lobe pneumonia.  Continue metoprolol diltiazem, Tikosyn and Xarelto.  Patient was n.p.o. but then he was very adamant about eating and since he was not on the list for cardioversion today he was started on a cardiac diet.

## 2016-11-29 NOTE — Progress Notes (Signed)
Paged MD and pharmacy about patient's metoprolol and xarelto that was due. Patient states he took those medications at [facility]. Charted as duplicate and doses will start later today.

## 2016-11-29 NOTE — H&P (Signed)
History and Physical    Chase Herrera:[id number] DOB: 20-Mar-1950 DOA: 11/28/2016  Referring MD/NP/PA: Dr. Marcie Mowers PCP: Neale Burly, MD  Patient coming from: [facility] via EMS  Chief Complaint: Shortness of breath and chest pain  HPI: Chase Herrera is a 66 y.o. male with medical history significant of SCLC (s/p lobectomy, chemotherapy, and radiation therapy), tobacco abuse, COPD, oxygen dependent, atrial flutter s/p ablation on Xarelto; who presented as a transfer from [facility] with complaints of shortness of breath and chest pain.  He reports that he had just recently been admitted to the hospital for pneumonia last week and had still been taking antibiotics.  While at home for the last 2 days patient reported worsening complaints of shortness of breath and continued productive cough with greenish sputum.  He tried to use his inhaler at home, but reported feeling dizzy and palpitations.  Due to the symptoms he drove himself to [facility] emergency department, and while driving there developed chest pain.  Chest pain symptoms resolved on their own.  In the emergency department he was noted to be in atrial flutter and there was concern for the patient having an acute STEMI based off EKG tracings.  Troponins were negative.  Chest x-ray continued to show a right lobe pneumonia similar to previous admission for pneumonia.  Patient was given aspirin and 10 mg of diltiazem with improvement of heart rate. Cardiology service was consulted here at [facility] and accepted the patient to a telemetry bed.  Upon arrival cardiology evaluated the patient and noted that patient's symptoms had resolved.  They recommended admission to [facility] at that time given other medical problems.  Later this morning,  [facility] called to notify us that 2 sets of blood cultures had grown gram-positive cocci.  ED Course: As seen above  Review of Systems  Constitutional: Positive for  malaise/fatigue.  HENT: Negative for ear discharge and ear pain.   Eyes: Negative for photophobia and pain.  Respiratory: Positive for cough, sputum production and shortness of breath.   Cardiovascular: Positive for chest pain, palpitations and leg swelling.  Gastrointestinal: Negative for abdominal pain, nausea and vomiting.  Genitourinary: Negative for dysuria and hematuria.  Musculoskeletal: Positive for joint pain and myalgias.  Skin: Positive for itching and rash.  Neurological: Negative for focal weakness and seizures.  Psychiatric/Behavioral: Negative for hallucinations and substance abuse.    Past Medical History:  Diagnosis Date  . Alcohol abuse    Quit 08/23/11  . Arthritis   . Atrial fibrillation ([place])   . Atrial flutter ([place])    CTI ablation by Dr [name] 10/2011  . COPD (chronic obstructive pulmonary disease) ([place])   . Depression   . Essential hypertension   . History of cardiomyopathy    LVEF 25-30% 08/2011 with subsequent normalization  . Obesity   . Prostate enlargement   . Small cell lung cancer ([place])    Right upper lobe - follows with [facility]  . Type 2 diabetes mellitus ([place])     Past Surgical History:  Procedure Laterality Date  . APPENDECTOMY    . ATRIAL ABLATION SURGERY  11/06/2011   CTI ablation for atrial flutter by Dr [name]  . Cataracts    . IR GENERIC HISTORICAL  04/09/2016   IR US GUIDE VASC ACCESS RIGHT 04/09/2016 [name], DO [facility]-INTERV RAD  . IR GENERIC HISTORICAL  04/09/2016   IR FLUORO GUIDE PORT INSERTION RIGHT 04/09/2016 [name], DO [facility]-INTERV  RAD  . TONSILLECTOMY       reports that he has quit smoking. His smoking use included cigarettes. He has a 23.50 pack-year smoking history. He quit smokeless tobacco use about 2 years ago. He reports that he drinks alcohol. He reports that he does not use drugs.  Allergies  Allergen Reactions  . Codeine Other (See Comments) and Itching    Family History  Problem Relation Age of  Onset  . Diabetes Sister   . Hypertension Mother   . Lung cancer Mother   . Hypertension Father   . Heart attack Father   . Hypertension Sister   . Hypertension Sister   . Hypertension Sister   . Pulmonary fibrosis Sister     Prior to Admission medications   Medication Sig Start Date End Date Taking? Authorizing Provider  albuterol (PROVENTIL HFA;VENTOLIN HFA) 108 (90 BASE) MCG/ACT inhaler Inhale 2 puffs into the lungs every 6 (six) hours as needed. For shortness of breath    [provider]  aspirin EC 81 MG tablet Take 81 mg by mouth daily.    [provider]  diltiazem (TIAZAC) 360 MG 24 hr capsule Take 360 mg by mouth daily. 09/18/16   [provider]  furosemide (LASIX) 40 MG tablet Take 40 mg by mouth daily.    [provider]  gabapentin (NEURONTIN) 300 MG capsule Take 300 mg by mouth 3 (three) times daily.     [provider]  insulin glargine (LANTUS) 100 UNIT/ML injection Inject 25 Units into the skin 2 (two) times daily.    [provider]  insulin lispro (HUMALOG) 100 UNIT/ML injection Inject into the skin. SSI    [provider]  LORazepam (ATIVAN) 0.5 MG tablet Take 1 tablet (0.5 mg total) by mouth every 6 (six) hours as needed (Nausea or vomiting). Patient not taking: Reported on 11/02/2016 04/16/16   Twana First, MD  metformin (FORTAMET) 500 MG (OSM) 24 hr tablet Take 1,000 mg by mouth. 1,00mg  am and 500 mg pm    [provider]  metoprolol tartrate (LOPRESSOR) 50 MG tablet Take 1 tablet (50 mg total) by mouth 2 (two) times daily. 11/02/16 01/31/17  Josue Hector, MD  Multiple Vitamin (MULTIVITAMIN) capsule Take by mouth.    [provider]  oxyCODONE-acetaminophen (PERCOCET/ROXICET) 5-325 MG tablet Take 1-2 tablets by mouth every 4 (four) hours as needed for severe pain. 09/27/16   Twana First, MD  tamsulosin (FLOMAX) 0.4 MG CAPS capsule Take by mouth. 08/25/13   [provider]     Physical Exam:  Constitutional: Obese male who appears acutely ill and in discomfort Vitals:   11/28/16 2250  BP: (!) 134/101  Pulse: 94  Resp: 18  Temp: 98.2 F (36.8 C)  TempSrc: Oral  SpO2: 92%  Weight: 123.9 kg (273 lb 3.2 oz)  Height: 6' (1.829 m)   Eyes: PERRL, lids and conjunctivae normal ENMT: Mucous membranes are moist. Posterior pharynx clear of any exudate or lesions.   Neck: normal, supple, no masses, no thyromegaly Respiratory: Decreased overall aeration with positive rales appreciated on the right lung field.  Patient on home nasal cannula oxygen of 2.5 L. Cardiovascular: Irregularly irregular. no murmurs / rubs / gallops.  2+ pitting lower extremity edema. 2+ pedal pulses. No carotid bruits.  Abdomen: Protuberant abdomen no tenderness, no masses palpated. No hepatosplenomegaly. Bowel sounds positive.  Musculoskeletal: no clubbing / cyanosis. No joint deformity upper and lower extremities. Good ROM, no contractures. Normal muscle  tone.  Skin: no rashes, lesions, ulcers. No induration Neurologic: CN 2-12 grossly intact. Sensation intact, DTR normal. Strength 5/5 in all 4.  Psychiatric: Normal judgment and insight. Alert and oriented x 3. Normal mood.     Labs on Admission: I have personally reviewed following labs and imaging studies  CBC: Recent Labs  Lab 11/28/16 2349  WBC 6.6  HGB 10.4*  HCT 32.8*  MCV 102.5*  PLT 161*   Basic Metabolic Panel: Recent Labs  Lab 11/28/16 2349  NA 136  K 4.1  CL 102  CO2 27  GLUCOSE 197*  BUN 15  CREATININE 1.04  CALCIUM 8.9   GFR: Estimated Creatinine Clearance: 97.5 mL/min (by C-G formula based on SCr of 1.04 mg/dL). Liver Function Tests: Recent Labs  Lab 11/28/16 2349  AST 22  ALT 23  ALKPHOS 47  BILITOT 0.6  PROT 5.2*  ALBUMIN 2.8*   No results for input(s): LIPASE, AMYLASE in the last 168 hours. No results for input(s): AMMONIA in the last 168 hours. Coagulation Profile: No results for  input(s): INR, PROTIME in the last 168 hours. Cardiac Enzymes: Recent Labs  Lab 11/28/16 2349  TROPONINI <0.03   BNP (last 3 results) No results for input(s): PROBNP in the last 8760 hours. HbA1C: No results for input(s): HGBA1C in the last 72 hours. CBG: No results for input(s): GLUCAP in the last 168 hours. Lipid Profile: No results for input(s): CHOL, HDL, LDLCALC, TRIG, CHOLHDL, LDLDIRECT in the last 72 hours. Thyroid Function Tests: No results for input(s): TSH, T4TOTAL, FREET4, T3FREE, THYROIDAB in the last 72 hours. Anemia Panel: No results for input(s): VITAMINB12, FOLATE, FERRITIN, TIBC, IRON, RETICCTPCT in the last 72 hours. Urine analysis:    Component Value Date/Time   COLORURINE YELLOW 01/29/2014 1808   APPEARANCEUR CLEAR 01/29/2014 1808   LABSPEC 1.020 01/29/2014 1808   PHURINE 6.5 01/29/2014 1808   GLUCOSEU 250 (A) 01/29/2014 1808   HGBUR NEGATIVE 01/29/2014 1808   BILIRUBINUR NEGATIVE 01/29/2014 1808   KETONESUR TRACE (A) 01/29/2014 1808   PROTEINUR NEGATIVE 01/29/2014 1808   UROBILINOGEN 0.2 01/29/2014 1808   NITRITE NEGATIVE 01/29/2014 1808   LEUKOCYTESUR NEGATIVE 01/29/2014 1808   Sepsis Labs: No results found for this or any previous visit (from the past 240 hour(s)).   Radiological Exams on Admission: No results found.  EKG: Independently reviewed.  Atrial fibrillation at 93 bpm  Assessment/Plan Atrial fibrillation/atrial flutter: Acute on chronic. Patient reports intermittent palpitations.  Reports taking all medications as prescribed including Tikosyn, diltiazem, metoprolol, and Xarelto.  Cardiology okayed continuation of Tikosyn if dose verified.  - Admit to a telemetry bed - [place] cardiology consultative services, recommendations as seen below - Continue diltiazem and metoprolol - Continue Tikosyn 250 mg twice daily - Plan for cardioversion +/- TEE in a.m. - NPO except for meds  - EP evaluation plan for in a.m.  Bacteremia, healthcare  associated pneumonia: Acute.  Patient noted to have persistent right lobe pneumonia.  Patient was noted to have 2+ blood cultures from [facility] for gram-positive cocci.  - Check blood and sputum cultures  - f/u with [facility] for sensitivities - Empiric antibiotics of Vanco and cefepime - Will likely need consult to infectious disease in a.m - Mucinex  Essential hypertension - Continue furosemide and medications as seen above  Diabetes mellitus type 2: Patient reports some with an A1c being approximately 5 a couple months ago. - Hypoglycemic protocol - hold  Metformin  - CBGs every 4 hours  with moderate sliding scale insulin - Restart home insulin regimen when medically appropriate   Anemia of chronic disease: Hemoglobin noted to be 10.4 on admission no reports of bleeding noted. - Recheck CBC in a.m.  COPD, without acute exacerbation - Budesonide nebs twice daily - Levalbuterol neb prn SOB/Wheezing  History small cell lung cancer receiving radiation  Thrombocytopenia: Chronic.  Platelet count noted to be 141 on admission.   - Recheck CBC in a.m.  DVT prophylaxis: Xarelto   Code Status: Full Family Communication: no family present at bedside Disposition Plan: TBD Consults called: cards  Admission status: inpatient   Norval Morton MD [facility] Pager [phone]   If 7PM-7AM, please contact night-coverage [email or web address] Password [email or web address]  11/29/2016, 1:44 AM

## 2016-11-30 ENCOUNTER — Inpatient Hospital Stay (HOSPITAL_COMMUNITY): Payer: Medicare Other

## 2016-11-30 DIAGNOSIS — I481 Persistent atrial fibrillation: Secondary | ICD-10-CM

## 2016-11-30 DIAGNOSIS — R7881 Bacteremia: Secondary | ICD-10-CM

## 2016-11-30 LAB — GLUCOSE, CAPILLARY
Glucose-Capillary: 112 mg/dL — ABNORMAL HIGH (ref 65–99)
Glucose-Capillary: 135 mg/dL — ABNORMAL HIGH (ref 65–99)
Glucose-Capillary: 398 mg/dL — ABNORMAL HIGH (ref 65–99)
Glucose-Capillary: 153 mg/dL — ABNORMAL HIGH (ref 65–99)
Glucose-Capillary: 234 mg/dL — ABNORMAL HIGH (ref 65–99)

## 2016-11-30 MED ORDER — DILTIAZEM HCL ER COATED BEADS 240 MG PO CP24
240.0000 mg | ORAL_CAPSULE | Freq: Two times a day (BID) | ORAL | Status: DC
  Administered 2016-12-01 – 2016-12-03 (×5): 240 mg via ORAL
  Filled 2016-11-30 (×5): qty 1

## 2016-11-30 MED ORDER — DILTIAZEM HCL ER COATED BEADS 120 MG PO CP24
120.0000 mg | ORAL_CAPSULE | Freq: Once | ORAL | Status: AC
  Administered 2016-11-30: 120 mg via ORAL
  Filled 2016-11-30: qty 1

## 2016-11-30 NOTE — Progress Notes (Addendum)
Progress Note  Patient Name: Chase Herrera Date of Encounter: 11/30/2016  Primary Cardiologist: Dr. [name]  Subjective   No new c/o, waxing/waning SOB, no CP  Inpatient Medications    Scheduled Meds: . aspirin EC  81 mg Oral Daily  . budesonide (PULMICORT) nebulizer solution  0.5 mg Nebulization BID  . diltiazem  120 mg Oral Once  . [START ON 12/01/2016] diltiazem  240 mg Oral BID  . furosemide  40 mg Oral Daily  . gabapentin  300 mg Oral TID  . guaiFENesin  600 mg Oral BID  . insulin aspart  0-15 Units Subcutaneous Q4H  . insulin glargine  25 Units Subcutaneous BID  . levalbuterol  0.63 mg Nebulization QID  . metoprolol tartrate  50 mg Oral BID  . rivaroxaban  20 mg Oral Q supper  . sodium chloride flush  10-40 mL Intracatheter Q12H  . tamsulosin  0.4 mg Oral QPC supper   Continuous Infusions: . ceFEPime (MAXIPIME) IV 1 g (11/30/16 0534)  . vancomycin Stopped (11/30/16 0930)   PRN Meds: acetaminophen, levalbuterol, ondansetron (ZOFRAN) IV, oxyCODONE-acetaminophen, sodium chloride flush   Vital Signs    Vitals:   11/30/16 0406 11/30/16 0755 11/30/16 0759 11/30/16 0849  BP: 117/65   104/64  Pulse: (!) 111 93  (!) 114  Resp: 20 18    Temp: 98.2 F (36.8 C)     TempSrc: Oral     SpO2: 98% 96% 96%   Weight: 272 lb (123.4 kg)     Height:        Intake/Output Summary (Last 24 hours) at 11/30/2016 0946 Last data filed at 11/30/2016 0930 Gross per 24 hour  Intake 1330 ml  Output 1700 ml  Net -370 ml   Filed Weights   11/28/16 2250 11/29/16 0527 11/30/16 0406  Weight: 273 lb 3.2 oz (123.9 kg) 271 lb 4.8 oz (123.1 kg) 272 lb (123.4 kg)    Telemetry    AFib 100-120 - Personally Reviewed  ECG    No new EKGs - Personally Reviewed  Physical Exam   GEN: No acute distress, chronically ill appearing Neck: No JVD Cardiac: iRRR, soft SM, rubs, or gallops.  Respiratory: soft scattered rhonchi b/l. GI: Soft, nontender, non-distended  MS: trace-1+ edema; No  deformity. Neuro:  Nonfocal  Psych: Normal affect   Labs    Chemistry Recent Labs  Lab 11/28/16 2349 11/29/16 0613  NA 136 137  K 4.1 3.8  CL 102 103  CO2 27 28  GLUCOSE 197* 153*  BUN 15 13  CREATININE 1.04 0.97  CALCIUM 8.9 8.9  PROT 5.2*  --   ALBUMIN 2.8*  --   AST 22  --   ALT 23  --   ALKPHOS 47  --   BILITOT 0.6  --   GFRNONAA >60 >60  GFRAA >60 >60  ANIONGAP 7 6     Hematology Recent Labs  Lab 11/28/16 2349 11/29/16 0613  WBC 6.6 5.0  RBC 3.20* 3.28*  HGB 10.4* 10.7*  HCT 32.8* 33.8*  MCV 102.5* 103.0*  MCH 32.5 32.6  MCHC 31.7 31.7  RDW 14.6 14.9  PLT 141* 145*    Cardiac Enzymes Recent Labs  Lab 11/28/16 2349  TROPONINI <0.03   No results for input(s): TROPIPOC in the last 168 hours.   BNPNo results for input(s): BNP, PROBNP in the last 168 hours.   DDimer No results for input(s): DDIMER in the last 168 hours.   Radiology  No results found.  Cardiac Studies   11/15/16: TTE Study Conclusions - Left ventricle: The cavity size was normal. Wall thickness was normal. Systolic function was normal. The estimated ejection fraction was in the range of 60% to 65%. Left ventricular diastolic function parameters were normal. - Aortic valve: Valve area (VTI): 3.65 cm^2. Valve area (Vmax): 3.42 cm^2. Valve area (Vmean): 3.4 cm^2.    Patient Profile     66 y.o. male with a hx of AFlutter ablated 2013, developed AFib?, severe O2 dependent COPD, hx of RUL small cell cancer (s/p Rad Tx/chemo, patient states completed therapy), he reports unable to have surgery 2/2 severe COPD, quit smoking 3 years ago, DM, HTN, admitted with AFlutter, + blood cultures, pneumonia?  Assessment & Plan    1.  AFlutter, atypical      Hx of flutter ablation, record indicates subsequently has had AFib      CHA2DS2Vasc is at least 2, on Xarelto appropriately dosed here at 20mg        The patient out patient was on Xarelto 15mg , in review of his labs, this  is under-dosed with Creat Clearance routinely 100 or better He is on Tikosyn 250mg  BID out patient, missed this AM, uncertain how long he has been out of rhythm, there is an EKG on his chart with AFlutter dated 11/24/16 w/CVR Given he was not adequately a/c Tikosyn discontinued.  No good AAD options, Tikosyn did not hold SR, reluctant to use amiodarone given severe pulmonary disease Rate control for now, generally 110-120 will up-titrate diltiazem to 240BID maintaining SR will likely prove difficult given lung disease, goal is rate control at least at this point   Continue with other multiple medical problems with medicine service   2. + blood cultures at [facility] 2/2 with gram + cocci      (despite reports of being actively on antibiotics for a recent pneumonia)     Repeat pending     C/w IM service 3. Severe COPD     Hx of lung Ca treated, ??brain METS 4. ? pneumonia 5. Lung Cancer s/p Rad and chemo 6. HTN 7. cxr doesnot suggest pulmonary edema     +edema     PO lasix was ordered     Follow I/O     For questions or updates, please contact [place] Please consult [email or web address] for contact info under Cardiology/STEMI.      Signed, Baldwin Jamaica, PA-C  11/30/2016, 9:46 AM    I have seen, examined the patient, and reviewed the above assessment and plan.  Changes to above are made where necessary.  On exam tachycardic irregular rhythm.  His AF/ flutter is driven by underlying lung disease.  We have very few options.  He is clear that he is not interested in amiodarone and has failed medical therapy with tikosyn already.  I would advise rate control at this time.  Increase diltiazem to 240mg  BID. Given bacteremia, he is not a candidate for PPM/ AV nodal ablation at this time.  Also not a candidate for AF ablation.   Continue anticoagulation.  Very complicated situation.  A high level of decision making was required for this encounter.  Co Sign: Thompson Grayer,  MD 11/30/2016 5:11 PM

## 2016-11-30 NOTE — Discharge Instructions (Signed)

## 2016-11-30 NOTE — Progress Notes (Signed)
PROGRESS NOTE    Chase Herrera  NAT:[id number] DOB: [birth year] DOA: 11/28/2016 PCP: Neale Burly, MD   Brief Narrative: 66 y.o. male with medical history significant of SCLC (s/p lobectomy, chemotherapy, and radiation therapy), tobacco abuse, COPD, oxygen dependent, atrial flutter s/p ablation on Xarelto; who presented as a transfer from [facility] with complaints of shortness of breath and chest pain.  He reports that he had just recently been admitted to the hospital for pneumonia last week and had still been taking antibiotics.  While at home for the last 2 days patient reported worsening complaints of shortness of breath and continued productive cough with greenish sputum.  He tried to use his inhaler at home, but reported feeling dizzy and palpitations.  Due to the symptoms he drove himself to [facility] emergency department, and while driving there developed chest pain.  Chest pain symptoms resolved on their own.  In the emergency department he was noted to be in atrial flutter and there was concern for the patient having an acute STEMI based off EKG tracings.  Troponins were negative.  Chest x-ray continued to show a right lobe pneumonia similar to previous admission for pneumonia.  Patient was given aspirin and 10 mg of diltiazem with improvement of heart rate. Cardiology service was consulted here at [facility] and accepted the patient to a telemetry bed.  Upon arrival cardiology evaluated the patient and noted that patient's symptoms had resolved.  They recommended admission to [facility] at that time given other medical problems.  Later this morning,  [facility] called to notify us that 2 sets of blood cultures had grown gram-positive cocci.    Assessment & Plan:   Principal Problem:   Atrial fibrillation (HCC) Active Problems:   Essential hypertension, benign   COPD (chronic obstructive pulmonary disease) (HCC)   Small cell lung cancer ([place])   Diabetes mellitus type 2 in obese  ([place])   Bacteremia due to Gram-positive bacteria   Healthcare-associated pneumonia 1]recurrent aflutter ablation done in the past without change in rhythm.on cardizem.noted dose increased today.his rate is in low 100s.he reports he doesn't feel any symptoms at that rate.continue xarelto.EP notes appreciated.   gram positive cocci 2/2 [facility].will follow.repeat cultures pending here.dw dr comer over the phone.will consult ID if positive staph aureuas.  o2 dependent copd,lung ca,and pneumonia-follow up cxr toda.continue vanc and cefepime.  Type 2 dm continue lantus.      DVT prophylaxis:  xarelto Code Status: full Family Communication: none Disposition Plan: tbd Consultants:  cards Procedures:none Antimicrobials:vanco,cefepime Subjective:feels better no cough   Objective:sitting by the side of bed in nad Vitals:   11/30/16 0759 11/30/16 0849 11/30/16 1212 11/30/16 1245  BP:  104/64  97/62  Pulse:  (!) 114 (!) 112 (!) 113  Resp:   20 18  Temp:    98.2 F (36.8 C)  TempSrc:    Oral  SpO2: 96%  96% 100%  Weight:      Height:        Intake/Output Summary (Last 24 hours) at 11/30/2016 1308 Last data filed at 11/30/2016 1207 Gross per 24 hour  Intake 1090 ml  Output 1175 ml  Net -85 ml   Filed Weights   11/28/16 2250 11/29/16 0527 11/30/16 0406  Weight: 123.9 kg (273 lb 3.2 oz) 123.1 kg (271 lb 4.8 oz) 123.4 kg (272 lb)    Examination:  General exam: Appears calm and comfortable  Respiratory system: Clear to auscultation. Respiratory effort normal. Cardiovascular  system: S1 & S2 heard, RRR. No JVD, murmurs, rubs, gallops or clicks. No pedal edema. Gastrointestinal system: Abdomen is nondistended, soft and nontender. No organomegaly or masses felt. Normal bowel sounds heard. Central nervous system: Alert and oriented. No focal neurological deficits. Extremities: Symmetric 5 x 5 power. Skin: No rashes, lesions or ulcers Psychiatry: Judgement and insight appear  normal. Mood & affect appropriate.     Data Reviewed: I have personally reviewed following labs and imaging studies  CBC: Recent Labs  Lab 11/28/16 2349 11/29/16 0613  WBC 6.6 5.0  HGB 10.4* 10.7*  HCT 32.8* 33.8*  MCV 102.5* 103.0*  PLT 141* 387*   Basic Metabolic Panel: Recent Labs  Lab 11/28/16 2349 11/29/16 0613  NA 136 137  K 4.1 3.8  CL 102 103  CO2 27 28  GLUCOSE 197* 153*  BUN 15 13  CREATININE 1.04 0.97  CALCIUM 8.9 8.9  MG  --  1.6*   GFR: Estimated Creatinine Clearance: 104.4 mL/min (by C-G formula based on SCr of 0.97 mg/dL). Liver Function Tests: Recent Labs  Lab 11/28/16 2349  AST 22  ALT 23  ALKPHOS 47  BILITOT 0.6  PROT 5.2*  ALBUMIN 2.8*   No results for input(s): LIPASE, AMYLASE in the last 168 hours. No results for input(s): AMMONIA in the last 168 hours. Coagulation Profile: No results for input(s): INR, PROTIME in the last 168 hours. Cardiac Enzymes: Recent Labs  Lab 11/28/16 2349  TROPONINI <0.03   BNP (last 3 results) No results for input(s): PROBNP in the last 8760 hours. HbA1C: No results for input(s): HGBA1C in the last 72 hours. CBG: Recent Labs  Lab 11/29/16 2005 11/29/16 2359 11/30/16 0405 11/30/16 0816 11/30/16 1208  GLUCAP 188* 214* 112* 135* 398*   Lipid Profile: No results for input(s): CHOL, HDL, LDLCALC, TRIG, CHOLHDL, LDLDIRECT in the last 72 hours. Thyroid Function Tests: No results for input(s): TSH, T4TOTAL, FREET4, T3FREE, THYROIDAB in the last 72 hours. Anemia Panel: No results for input(s): VITAMINB12, FOLATE, FERRITIN, TIBC, IRON, RETICCTPCT in the last 72 hours. Sepsis Labs: No results for input(s): PROCALCITON, LATICACIDVEN in the last 168 hours.  Recent Results (from the past 240 hour(s))  Culture, sputum-assessment     Status: None   Collection Time: 11/29/16  5:23 AM  Result Value Ref Range Status   Specimen Description SPUTUM  Final   Special Requests NONE  Final   Sputum evaluation  THIS SPECIMEN IS ACCEPTABLE FOR SPUTUM CULTURE  Final   Report Status 11/29/2016 FINAL  Final  Culture, respiratory (NON-Expectorated)     Status: None (Preliminary result)   Collection Time: 11/29/16  5:23 AM  Result Value Ref Range Status   Specimen Description SPUTUM  Final   Special Requests NONE Reflexed from [id number]  Final   Gram Stain   Final    ABUNDANT WBC PRESENT, PREDOMINANTLY PMN ABUNDANT GRAM NEGATIVE RODS MODERATE GRAM POSITIVE RODS MODERATE GRAM POSITIVE COCCI    Culture CULTURE REINCUBATED FOR BETTER GROWTH  Final   Report Status PENDING  Incomplete         Radiology Studies: No results found.      Scheduled Meds: . aspirin EC  81 mg Oral Daily  . budesonide (PULMICORT) nebulizer solution  0.5 mg Nebulization BID  . diltiazem  120 mg Oral Once  . [START ON 12/01/2016] diltiazem  240 mg Oral BID  . furosemide  40 mg Oral Daily  . gabapentin  300 mg Oral TID  . guaiFENesin  600 mg Oral BID  . insulin aspart  0-15 Units Subcutaneous Q4H  . insulin glargine  25 Units Subcutaneous BID  . levalbuterol  0.63 mg Nebulization QID  . metoprolol tartrate  50 mg Oral BID  . rivaroxaban  20 mg Oral Q supper  . sodium chloride flush  10-40 mL Intracatheter Q12H  . tamsulosin  0.4 mg Oral QPC supper   Continuous Infusions: . ceFEPime (MAXIPIME) IV 1 g (11/30/16 0534)  . vancomycin Stopped (11/30/16 0930)     LOS: 2 days     Georgette Shell, MD [facility] If 7PM-7AM, please contact night-coverage [email or web address] Password [email or web address] 11/30/2016, 1:08 PM

## 2016-12-01 ENCOUNTER — Inpatient Hospital Stay (HOSPITAL_COMMUNITY): Payer: Medicare Other

## 2016-12-01 DIAGNOSIS — Z794 Long term (current) use of insulin: Secondary | ICD-10-CM

## 2016-12-01 DIAGNOSIS — Z9221 Personal history of antineoplastic chemotherapy: Secondary | ICD-10-CM

## 2016-12-01 DIAGNOSIS — Z87891 Personal history of nicotine dependence: Secondary | ICD-10-CM

## 2016-12-01 DIAGNOSIS — Z79899 Other long term (current) drug therapy: Secondary | ICD-10-CM

## 2016-12-01 DIAGNOSIS — Z95828 Presence of other vascular implants and grafts: Secondary | ICD-10-CM

## 2016-12-01 DIAGNOSIS — Z885 Allergy status to narcotic agent status: Secondary | ICD-10-CM

## 2016-12-01 DIAGNOSIS — E119 Type 2 diabetes mellitus without complications: Secondary | ICD-10-CM

## 2016-12-01 DIAGNOSIS — J209 Acute bronchitis, unspecified: Secondary | ICD-10-CM

## 2016-12-01 DIAGNOSIS — Z923 Personal history of irradiation: Secondary | ICD-10-CM

## 2016-12-01 DIAGNOSIS — K08409 Partial loss of teeth, unspecified cause, unspecified class: Secondary | ICD-10-CM

## 2016-12-01 DIAGNOSIS — Z7951 Long term (current) use of inhaled steroids: Secondary | ICD-10-CM

## 2016-12-01 DIAGNOSIS — C3411 Malignant neoplasm of upper lobe, right bronchus or lung: Secondary | ICD-10-CM

## 2016-12-01 DIAGNOSIS — I420 Dilated cardiomyopathy: Secondary | ICD-10-CM

## 2016-12-01 DIAGNOSIS — Z902 Acquired absence of lung [part of]: Secondary | ICD-10-CM

## 2016-12-01 LAB — GLUCOSE, CAPILLARY
Glucose-Capillary: 111 mg/dL — ABNORMAL HIGH (ref 65–99)
Glucose-Capillary: 118 mg/dL — ABNORMAL HIGH (ref 65–99)
Glucose-Capillary: 126 mg/dL — ABNORMAL HIGH (ref 65–99)
Glucose-Capillary: 136 mg/dL — ABNORMAL HIGH (ref 65–99)
Glucose-Capillary: 140 mg/dL — ABNORMAL HIGH (ref 65–99)
Glucose-Capillary: 168 mg/dL — ABNORMAL HIGH (ref 65–99)
Glucose-Capillary: 205 mg/dL — ABNORMAL HIGH (ref 65–99)

## 2016-12-01 LAB — CULTURE, RESPIRATORY W GRAM STAIN

## 2016-12-01 MED ORDER — IOPAMIDOL (ISOVUE-300) INJECTION 61%
INTRAVENOUS | Status: AC
  Administered 2016-12-01: 75 mL
  Filled 2016-12-01: qty 100

## 2016-12-01 NOTE — Consult Note (Signed)
[facility] for Infectious Disease    Date of Admission:  11/28/2016           Day 4 vancomycin        Day 4 cefepime              Reason for Consult: Methicillin-resistant coagulase-negative staph bacteremia    Referring Provider: Dr. Jolyn Nap  Assessment: I m not absolutely certain of the significance of the positive blood cultures growing regularly is negative staph. Of course it could represent true bacteremia but, if both sets were drawn from his Port-A-Cath, they could be simple contaminants. He has not had any documented fever and repeat blood cultures here are negative. The blood cultures remain negative tomorrow should be safe to place a permanent pacemaker at any time.  I'm not convinced that he has healthcare associated pneumonia but he certainly has an acute flare of his chronic bronchitis. I agree with continuing cefepime for now.  If he does need a permanent pacemaker and has no evidence of persistent/recurrent cancer I would suggest having his Port-A-Cath removed soon to decrease the risk of future bloodstream infections that could lead to pacemaker infection.  Plan: 1. Continue vancomycin and cefepime pending the results of repeat blood cultures   Principal Problem:   Atrial fibrillation (HCC) Active Problems:   Bacteremia due to Gram-positive bacteria   Healthcare-associated pneumonia   Essential hypertension, benign   COPD (chronic obstructive pulmonary disease) (HCC)   Small cell lung cancer ([place])   Diabetes mellitus type 2 in obese (HCC)   Scheduled Meds: . budesonide (PULMICORT) nebulizer solution  0.5 mg Nebulization BID  . diltiazem  240 mg Oral BID  . furosemide  40 mg Oral Daily  . gabapentin  300 mg Oral TID  . guaiFENesin  600 mg Oral BID  . insulin aspart  0-15 Units Subcutaneous Q4H  . insulin glargine  25 Units Subcutaneous BID  . levalbuterol  0.63 mg Nebulization QID  . metoprolol tartrate  50 mg Oral BID  . rivaroxaban  20  mg Oral Q supper  . sodium chloride flush  10-40 mL Intracatheter Q12H  . tamsulosin  0.4 mg Oral QPC supper   Continuous Infusions: . ceFEPime (MAXIPIME) IV Stopped (12/01/16 0538)  . vancomycin Stopped (12/01/16 0717)   PRN Meds:.acetaminophen, levalbuterol, ondansetron (ZOFRAN) IV, oxyCODONE-acetaminophen, sodium chloride flush  HPI: Chase Herrera is a 66 y.o. male with COPD, diabetes, dilated cardiomyopathy and history of recurrent atrial fibrillation and atrial flutter. He was recently treated for right lung cancer. He underwent VATS resection of his right upper lobe followed by chemotherapy and radiation therapy. He still has a Port-A-Cath in place. He recently developed increase cough productive of yellow-green sputum, shortness of breath, chest pain and palpitations and went to the emergency department at [facility] and [place], [place]. There was concern for acute myocardial infarction so transfer here was arranged. He had 2 sets of blood cultures done on 11/28/2016 before transfer. Both are growing methicillin-resistant staph hominis (a coagulase-negative staph species). The lab tech in [place] could not tell me where the blood cultures were drawn from. Mr. Henricksen believes they were drawn from his Port-A-Cath. He denies having any recent fever, chills or sweats. The blood cultures were reported to be positive after transfer here. I do not have any records as to whether or not he was treated with antibiotics while in [place]. He has been on IV  vancomycin and cefepime since transfer here. His chest x-ray shows stable scar in the left base and right upper lobe with no other acute changes. Sputum cultures have grown Pseudomonas and other mixed bacterial flora. He says that he is feeling much better over the past few days. Dr. Caryl Comes asked me to render an opinion as to when it might be safe to have a permanent pacemaker placed if he needs that to control his atrial flutter.   Review of  Systems: Review of Systems  Constitutional: Positive for malaise/fatigue. Negative for chills, diaphoresis, fever and weight loss.  HENT: Negative for sore throat.   Respiratory: Positive for cough, sputum production, shortness of breath and wheezing. Negative for hemoptysis.   Cardiovascular: Positive for chest pain.  Gastrointestinal: Negative for abdominal pain, diarrhea, heartburn, nausea and vomiting.  Genitourinary: Negative for dysuria and frequency.  Musculoskeletal: Positive for joint pain. Negative for myalgias.  Skin: Negative for rash.  Neurological: Negative for dizziness and headaches.  Psychiatric/Behavioral: Negative for depression and substance abuse.       He quit smoking and drinking alcohol 3 years ago.    Past Medical History:  Diagnosis Date  . Alcohol abuse    Quit 08/23/11  . Arthritis   . Atrial fibrillation ([place])   . Atrial flutter ([place])    CTI ablation by Dr [name] 10/2011  . COPD (chronic obstructive pulmonary disease) ([place])   . Depression   . Essential hypertension   . History of cardiomyopathy    LVEF 25-30% 08/2011 with subsequent normalization  . Obesity   . Prostate enlargement   . Small cell lung cancer ([place])    Right upper lobe - [facility]  . Type 2 diabetes mellitus (HCC)     Social History   Tobacco Use  . Smoking status: Former Smoker    Packs/day: 0.50    Years: 47.00    Pack years: 23.50    Types: Cigarettes  . Smokeless tobacco: Former Systems developer    Quit date: 04/10/2014  . Tobacco comment: encouraged to quit today 03/04/12  Substance Use Topics  . Alcohol use: Yes    Comment: quit 2 years ago  . Drug use: No    Comment: marajuana occasionally    Family History  Problem Relation Age of Onset  . Diabetes Sister   . Hypertension Mother   . Lung cancer Mother   . Hypertension Father   . Heart attack Father   . Hypertension Sister   . Hypertension Sister   . Hypertension Sister   . Pulmonary fibrosis Sister     Allergies  Allergen Reactions  . Codeine Other (See Comments) and Itching    OBJECTIVE: Blood pressure 94/63, pulse (!) 125, temperature 98.3 F (36.8 C), temperature source Oral, resp. rate 20, height 6' (1.829 m), weight 274 lb 4.8 oz (124.4 kg), SpO2 96 %.  Physical Exam  Constitutional: He is oriented to person, place, and time.  He is sitting up on the side of the bed eating lunch. He is very talkative and in good spirits.  HENT:  Mouth/Throat: No oropharyngeal exudate.  His few remaining teeth are in poor condition.  Cardiovascular:  No murmur heard. Very distant heart sounds. Irregular rhythm.  Pulmonary/Chest: Effort normal. He has wheezes. He has no rales.  A sputum basin on the table beside him is full of thick, yellow-green sputum. His right anterior chest Port-A-Cath site appears normal.  Abdominal: Soft. He exhibits no distension. There is  no tenderness.  He is obese.  Musculoskeletal: Normal range of motion. He exhibits no edema or tenderness.  Neurological: He is alert and oriented to person, place, and time.  Skin: No rash noted.  Psychiatric: Mood and affect normal.    Lab Results Lab Results  Component Value Date   WBC 5.0 11/29/2016   HGB 10.7 (L) 11/29/2016   HCT 33.8 (L) 11/29/2016   MCV 103.0 (H) 11/29/2016   PLT 145 (L) 11/29/2016    Lab Results  Component Value Date   CREATININE 0.97 11/29/2016   BUN 13 11/29/2016   NA 137 11/29/2016   K 3.8 11/29/2016   CL 103 11/29/2016   CO2 28 11/29/2016    Lab Results  Component Value Date   ALT 23 11/28/2016   AST 22 11/28/2016   ALKPHOS 47 11/28/2016   BILITOT 0.6 11/28/2016     Microbiology: Recent Results (from the past 240 hour(s))  Culture, sputum-assessment     Status: None   Collection Time: 11/29/16  5:23 AM  Result Value Ref Range Status   Specimen Description SPUTUM  Final   Special Requests NONE  Final   Sputum evaluation THIS SPECIMEN IS ACCEPTABLE FOR SPUTUM CULTURE  Final    Report Status 11/29/2016 FINAL  Final  Culture, respiratory (NON-Expectorated)     Status: None   Collection Time: 11/29/16  5:23 AM  Result Value Ref Range Status   Specimen Description SPUTUM  Final   Special Requests NONE Reflexed from [id number]  Final   Gram Stain   Final    ABUNDANT WBC PRESENT, PREDOMINANTLY PMN ABUNDANT GRAM NEGATIVE RODS MODERATE GRAM POSITIVE RODS MODERATE GRAM POSITIVE COCCI    Culture   Final    ABUNDANT PSEUDOMONAS AERUGINOSA WITHIN MIXED BACTERIAL ORGANISMS    Report Status 12/01/2016 FINAL  Final   Organism ID, Bacteria PSEUDOMONAS AERUGINOSA  Final      Susceptibility   Pseudomonas aeruginosa - MIC*    CEFTAZIDIME 4 SENSITIVE Sensitive     CIPROFLOXACIN <=0.25 SENSITIVE Sensitive     GENTAMICIN <=1 SENSITIVE Sensitive     IMIPENEM 2 SENSITIVE Sensitive     PIP/TAZO 8 SENSITIVE Sensitive     CEFEPIME 4 SENSITIVE Sensitive     * ABUNDANT PSEUDOMONAS AERUGINOSA  Culture, blood (routine x 2)     Status: None (Preliminary result)   Collection Time: 11/29/16  6:15 AM  Result Value Ref Range Status   Specimen Description BLOOD RIGHT ANTECUBITAL  Final   Special Requests   Final    BOTTLES DRAWN AEROBIC AND ANAEROBIC Blood Culture adequate volume   Culture NO GROWTH 1 DAY  Final   Report Status PENDING  Incomplete  Culture, blood (routine x 2)     Status: None (Preliminary result)   Collection Time: 11/29/16  6:30 AM  Result Value Ref Range Status   Specimen Description BLOOD RIGHT FOREARM  Final   Special Requests   Final    BOTTLES DRAWN AEROBIC AND ANAEROBIC Blood Culture adequate volume   Culture NO GROWTH 1 DAY  Final   Report Status PENDING  Incomplete    Michel Bickers, MD [place] [facility] [phone] pager   [phone] cell 12/01/2016, 1:16 PM

## 2016-12-01 NOTE — Progress Notes (Signed)
PROGRESS NOTE    Chase Herrera  JKD:[id number] DOB: [birth year] DOA: 11/28/2016 PCP: Neale Burly, MD  Brief Narrative: 66 y.o.malewith medical history significant ofSCLC(s/plobectomy, chemotherapy, and radiation therapy), tobacco abuse, COPD, oxygen dependent, atrial flutters/pablation on Xarelto;[facility] of [facility] and [facility].He reports that he hadjust recently beenadmitted to thehospital for pneumonia last week and had still beentakingantibiotics. While at home for the last 2 days patient reported worseningcomplaints of shortness of breath and continued productive cough with greenish sputum.He tried touse his inhaler at home, butreported feeling dizzy and palpitations.Due to the symptoms he drove himself to [place], and while driving there developed chest pain. Chest pain symptoms resolved on their own. In the emergency department he was noted to be in atrial flutter and there was concern for the patient having anacute STEMI based off EKG tracings. Troponins were negative. Chest x-ray continued to show a right lobe pneumonia similar to previous admission for pneumonia. Patient was given aspirin and 10 mg of diltiazem with improvement of heart rate. Cardiology service was consulted here at [facility] and accepted the patient to a telemetry bed. Upon arrival cardiology evaluated the patient and noted that patient's symptoms had resolved. They recommended [facility] at that time given other medical problems. [facility] sets [facility] cultures had grown gram-positive cocci.  REVIWED ID and EP NOTES.He feels better with his breathing and cough.CXR done yest shows  Stable patchy infiltrative changes in the left base and medial right apex when compared with the prior exam. No new focal abnormality is noted. Patient reports  that he is due for a CT scan chest recommended by his oncologist which was supposedly being done while he is here. Assessment & Plan:   Principal Problem:   Atrial fibrillation (HCC) Active Problems:   Essential hypertension, benign   COPD (chronic obstructive pulmonary disease) (HCC)   Small cell lung cancer ([place])   Diabetes mellitus type 2 in obese ([place])   Bacteremia due to Gram-positive bacteria   Healthcare-associated pneumonia  Recurrent afib/flutter - per cards.  Currently on Cardizem.  Notes about thoughts of putting a pacemaker noted.  Once infection is completely cleared.  Monitor electrolytes renal functions. Pneumonia/bronchitis-on vancomycin and cefepime.  Pharmacist [facility]itzi [facility] to [facility]ist there and found that his blood cultures 2 out of 2 is growing staph hominis sensitive to rifampin gentamicin and vancomycin.  I am just waiting to get a faxed copy of his labs.  ID notes reviewed and appreciated.  Will obtain a CT scan of his chest in view of his lung cancer and recurrent pneumonia bronchitis. 2 diabetes continue Lantus  DVT prophylaxis:xarelto Code Status: full Family Communication: none Disposition Plan: .tbd   Consultants: cards/id  Procedures: none Antimicrobials: vanc/cefepime Subjective: Feels ok but worried if cancer is back  Objective:sitiing by the side of the bed.in nad. Vitals:   12/01/16 0849 12/01/16 1017 12/01/16 1147 12/01/16 1219  BP: 100/66 103/65  94/63  Pulse: (!) 116 (!) 110  (!) 125  Resp:    20  Temp:    98.3 F (36.8 C)  TempSrc:    Oral  SpO2: 98% 99% 96% 96%  Weight:      Height:        Intake/Output Summary (Last 24 hours) at 12/01/2016 1355 Last data filed at 12/01/2016 1221 Gross per 24 hour  Intake 1600 ml  Output 1475 ml  Net 125 ml   Filed Weights   11/29/16 0527 11/30/16 0406 12/01/16 0514  Weight: 123.1 kg (271 lb 4.8 oz) 123.4 kg (272 lb) 124.4 kg (274 lb 4.8 oz)     Examination:  General exam: Appears calm and comfortable  Respiratory system: Clear to auscultation. Respiratory effort normal. Cardiovascular system: S1 & S2 heard, RRR. No JVD, murmurs, rubs, gallops or clicks. No pedal edema. Gastrointestinal system: Abdomen is nondistended, soft and nontender. No organomegaly or masses felt. Normal bowel sounds heard. Central nervous system: Alert and oriented. No focal neurological deficits. Extremities: Symmetric 5 x 5 power. Skin: No rashes, lesions or ulcers Psychiatry: Judgement and insight appear normal. Mood & affect appropriate.     Data Reviewed: I have personally reviewed following labs and imaging studies  CBC: Recent Labs  Lab 11/28/16 2349 11/29/16 0613  WBC 6.6 5.0  HGB 10.4* 10.7*  HCT 32.8* 33.8*  MCV 102.5* 103.0*  PLT 141* 767*   Basic Metabolic Panel: Recent Labs  Lab 11/28/16 2349 11/29/16 0613  NA 136 137  K 4.1 3.8  CL 102 103  CO2 27 28  GLUCOSE 197* 153*  BUN 15 13  CREATININE 1.04 0.97  CALCIUM 8.9 8.9  MG  --  1.6*   GFR: Estimated Creatinine Clearance: 104.8 mL/min (by C-G formula based on SCr of 0.97 mg/dL). Liver Function Tests: Recent Labs  Lab 11/28/16 2349  AST 22  ALT 23  ALKPHOS 47  BILITOT 0.6  PROT 5.2*  ALBUMIN 2.8*   No results for input(s): LIPASE, AMYLASE in the last 168 hours. No results for input(s): AMMONIA in the last 168 hours. Coagulation Profile: No results for input(s): INR, PROTIME in the last 168 hours. Cardiac Enzymes: Recent Labs  Lab 11/28/16 2349  TROPONINI <0.03   BNP (last 3 results) No results for input(s): PROBNP in the last 8760 hours. HbA1C: No results for input(s): HGBA1C in the last 72 hours. CBG: Recent Labs  Lab 11/30/16 2026 12/01/16 0018 12/01/16 0401 12/01/16 0739 12/01/16 1136  GLUCAP 234* 111* 136* 118* 168*   Lipid Profile: No results for input(s): CHOL, HDL, LDLCALC, TRIG, CHOLHDL, LDLDIRECT in the last 72 hours. Thyroid  Function Tests: No results for input(s): TSH, T4TOTAL, FREET4, T3FREE, THYROIDAB in the last 72 hours. Anemia Panel: No results for input(s): VITAMINB12, FOLATE, FERRITIN, TIBC, IRON, RETICCTPCT in the last 72 hours. Sepsis Labs: No results for input(s): PROCALCITON, LATICACIDVEN in the last 168 hours.  Recent Results (from the past 240 hour(s))  Culture, sputum-assessment     Status: None   Collection Time: 11/29/16  5:23 AM  Result Value Ref Range Status   Specimen Description SPUTUM  Final   Special Requests NONE  Final   Sputum evaluation THIS SPECIMEN IS ACCEPTABLE FOR SPUTUM CULTURE  Final   Report Status 11/29/2016 FINAL  Final  Culture, respiratory (NON-Expectorated)     Status: None   Collection Time: 11/29/16  5:23 AM  Result Value Ref Range Status   Specimen Description SPUTUM  Final   Special Requests NONE Reflexed from [id number]  Final   Gram Stain   Final    ABUNDANT WBC PRESENT, PREDOMINANTLY PMN ABUNDANT GRAM NEGATIVE RODS MODERATE GRAM POSITIVE RODS MODERATE GRAM POSITIVE COCCI    Culture   Final    ABUNDANT PSEUDOMONAS AERUGINOSA WITHIN MIXED BACTERIAL ORGANISMS    Report Status 12/01/2016 FINAL  Final   Organism ID, Bacteria PSEUDOMONAS AERUGINOSA  Final  Susceptibility   Pseudomonas aeruginosa - MIC*    CEFTAZIDIME 4 SENSITIVE Sensitive     CIPROFLOXACIN <=0.25 SENSITIVE Sensitive     GENTAMICIN <=1 SENSITIVE Sensitive     IMIPENEM 2 SENSITIVE Sensitive     PIP/TAZO 8 SENSITIVE Sensitive     CEFEPIME 4 SENSITIVE Sensitive     * ABUNDANT PSEUDOMONAS AERUGINOSA  Culture, blood (routine x 2)     Status: None (Preliminary result)   Collection Time: 11/29/16  6:15 AM  Result Value Ref Range Status   Specimen Description BLOOD RIGHT ANTECUBITAL  Final   Special Requests   Final    BOTTLES DRAWN AEROBIC AND ANAEROBIC Blood Culture adequate volume   Culture NO GROWTH 1 DAY  Final   Report Status PENDING  Incomplete  Culture, blood (routine x 2)      Status: None (Preliminary result)   Collection Time: 11/29/16  6:30 AM  Result Value Ref Range Status   Specimen Description BLOOD RIGHT FOREARM  Final   Special Requests   Final    BOTTLES DRAWN AEROBIC AND ANAEROBIC Blood Culture adequate volume   Culture NO GROWTH 1 DAY  Final   Report Status PENDING  Incomplete         Radiology Studies: Dg Chest 2 View  Result Date: 11/30/2016 CLINICAL DATA:  History of lung carcinoma with shortness of breath, initial encounter EXAM: CHEST  2 VIEW COMPARISON:  11/28/2016 FINDINGS: Cardiac shadow is stable. Right-sided chest port is noted in satisfactory position. The Huber needle within the port reservoir appears somewhat superiorly placed on the frontal film but appears appropriately placed on the lateral film. This is likely projectional in nature. The right lung remains clear with the exception of patchy changes in the medial aspect of the right apex. Stable changes in the left base are again seen. No sizable effusion is noted. No acute bony abnormality is seen. IMPRESSION: Stable patchy infiltrative changes in the left base and medial right apex when compared with the prior exam. No new focal abnormality is noted. Electronically Signed   By: Inez Catalina M.D.   On: 11/30/2016 16:03        Scheduled Meds: . budesonide (PULMICORT) nebulizer solution  0.5 mg Nebulization BID  . diltiazem  240 mg Oral BID  . furosemide  40 mg Oral Daily  . gabapentin  300 mg Oral TID  . guaiFENesin  600 mg Oral BID  . insulin aspart  0-15 Units Subcutaneous Q4H  . insulin glargine  25 Units Subcutaneous BID  . levalbuterol  0.63 mg Nebulization QID  . metoprolol tartrate  50 mg Oral BID  . rivaroxaban  20 mg Oral Q supper  . sodium chloride flush  10-40 mL Intracatheter Q12H  . tamsulosin  0.4 mg Oral QPC supper   Continuous Infusions: . ceFEPime (MAXIPIME) IV 1 g (12/01/16 1323)  . vancomycin Stopped (12/01/16 0717)     LOS: 3 days      Georgette Shell, MD [facility]  If 7PM-7AM, please contact night-coverage [email or web address] Password [email or web address] 12/01/2016, 1:55 PM

## 2016-12-01 NOTE — Progress Notes (Addendum)
Progress Note  Patient Name: Chase Herrera Date of Encounter: 12/01/2016  Primary Cardiologist: Dr. [name]   Patient Profile     66 y.o. male with a hx of AFlutter ablated 2013, developed AFib?, severe O2 dependent COPD, hx of RUL small cell cancer ? Brain mets  (s/p Rad Tx/chemo, patient states completed therapy), he reports unable to have surgery 2/2 severe COPD, quit smoking 3 years ago, DM, HTN, admitted with AFlutter,RVR  + blood cultures,at [place] 2/2 g + cocci  pneumonia? Seen by [facility] felt not candidate  for AAD so rate control would be strategy.  He failed to maintain sinus rhythm on dofetilide initiated at [facility],  On ASA and Rivaroxaban   Subjective   No new c/o, waxing/waning SOB, no CP   Inpatient Medications    Scheduled Meds: . aspirin EC  81 mg Oral Daily  . budesonide (PULMICORT) nebulizer solution  0.5 mg Nebulization BID  . diltiazem  240 mg Oral BID  . furosemide  40 mg Oral Daily  . gabapentin  300 mg Oral TID  . guaiFENesin  600 mg Oral BID  . insulin aspart  0-15 Units Subcutaneous Q4H  . insulin glargine  25 Units Subcutaneous BID  . levalbuterol  0.63 mg Nebulization QID  . metoprolol tartrate  50 mg Oral BID  . rivaroxaban  20 mg Oral Q supper  . sodium chloride flush  10-40 mL Intracatheter Q12H  . tamsulosin  0.4 mg Oral QPC supper   Continuous Infusions: . ceFEPime (MAXIPIME) IV Stopped (12/01/16 0538)  . vancomycin 1,250 mg (12/01/16 0547)   PRN Meds: acetaminophen, levalbuterol, ondansetron (ZOFRAN) IV, oxyCODONE-acetaminophen, sodium chloride flush   Vital Signs    Vitals:   11/30/16 1619 11/30/16 2027 11/30/16 2120 12/01/16 0514  BP:  (!) 92/58 (!) 103/58 104/66  Pulse:  (!) 112 (!) 114 (!) 113  Resp:  18  18  Temp:  98.2 F (36.8 C)  97.6 F (36.4 C)  TempSrc:  Oral    SpO2: 94% 99%  98%  Weight:    274 lb 4.8 oz (124.4 kg)  Height:        Intake/Output Summary (Last 24 hours) at 12/01/2016 0807 Last data  filed at 12/01/2016 0500 Gross per 24 hour  Intake 1840 ml  Output 1325 ml  Net 515 ml   Filed Weights   11/29/16 0527 11/30/16 0406 12/01/16 0514  Weight: 271 lb 4.8 oz (123.1 kg) 272 lb (123.4 kg) 274 lb 4.8 oz (124.4 kg)    Telemetry    Aflutter 2:1  - Personally Reviewed  ECG     Physical Exam   Well developed and nourished in mod resp distress HENT normal Neck supple   Crackles wheezes Rapid and Regular rate and rhythm, no murmurs or gallops Abd-soft with active BS without hepatomegaly No Clubbing cyanosis edema Skin-warm and dry A & Oriented  Grossly normal sensory and motor function   Labs    Chemistry Recent Labs  Lab 11/28/16 2349 11/29/16 0613  NA 136 137  K 4.1 3.8  CL 102 103  CO2 27 28  GLUCOSE 197* 153*  BUN 15 13  CREATININE 1.04 0.97  CALCIUM 8.9 8.9  PROT 5.2*  --   ALBUMIN 2.8*  --   AST 22  --   ALT 23  --   ALKPHOS 47  --   BILITOT 0.6  --   GFRNONAA >60 >60  GFRAA >60 >60  ANIONGAP 7  6     Hematology Recent Labs  Lab 11/28/16 2349 11/29/16 0613  WBC 6.6 5.0  RBC 3.20* 3.28*  HGB 10.4* 10.7*  HCT 32.8* 33.8*  MCV 102.5* 103.0*  MCH 32.5 32.6  MCHC 31.7 31.7  RDW 14.6 14.9  PLT 141* 145*    Cardiac Enzymes Recent Labs  Lab 11/28/16 2349  TROPONINI <0.03   No results for input(s): TROPIPOC in the last 168 hours.   BNPNo results for input(s): BNP, PROBNP in the last 168 hours.   DDimer No results for input(s): DDIMER in the last 168 hours.   Radiology    Dg Chest 2 View  Result Date: 11/30/2016 CLINICAL DATA:  History of lung carcinoma with shortness of breath, initial encounter EXAM: CHEST  2 VIEW COMPARISON:  11/28/2016 FINDINGS: Cardiac shadow is stable. Right-sided chest port is noted in satisfactory position. The Huber needle within the port reservoir appears somewhat superiorly placed on the frontal film but appears appropriately placed on the lateral film. This is likely projectional in nature. The right  lung remains clear with the exception of patchy changes in the medial aspect of the right apex. Stable changes in the left base are again seen. No sizable effusion is noted. No acute bony abnormality is seen. IMPRESSION: Stable patchy infiltrative changes in the left base and medial right apex when compared with the prior exam. No new focal abnormality is noted. Electronically Signed   By: Inez Catalina M.D.   On: 11/30/2016 16:03    Cardiac Studies   11/15/16: TTE Study Conclusions - Left ventricle: The cavity size was normal. Wall thickness was normal. Systolic function was normal. The estimated ejection fraction was in the range of 60% to 65%. Left ventricular diastolic function parameters were normal. - Aortic valve: Valve area (VTI): 3.65 cm^2. Valve area (Vmax): 3.42 cm^2. Valve area (Vmean): 3.4 cm^2.     Assessment & Plan    1.  AFlutter, atypical      Hx of flutter ablation, record indicates subsequently has had AFib      CHA2DS2Vasc is at least 2, on Xarelto appropriately dosed here at 20mg     2. + blood cultures at [facility] 2/2 with gram + cocci    3. Severe COPD     Hx of lung Ca treated, ??brain METS 4. ? pneumonia 5. Lung Cancer s/p Rad and chemo 6. HTN   Rate control strategies are going to be problematic as hypotension precludes up titration of medications.  He is averse to amiodarone as his wife died of pulmonary fibrosis and he is aware of the pulmonary toxicities of amiodarone.  His heart rates have been fast for months.  The fact that is LV function has remained stable is encouraging and gives [name] some latitude in time.  Hence, I have asked Dr. Megan Salon from ID to help give US guidance as to what juncture we could presume that he is relatively risk-free from recurrent bacteremia for the implantation of pacemaker with AV junction ablation to follow.  He has many questions regarding pulmonary processes identified on x-rays.  There is no immediate need for  him to stay in hospital from a cardiac point of view given the limitations of therapy and the chronicity of his tachycardia  I do not see an indication for aspirin adjunctive to his Rivaroxaban so will discontinue it

## 2016-12-02 LAB — GLUCOSE, CAPILLARY
Glucose-Capillary: 114 mg/dL — ABNORMAL HIGH (ref 65–99)
Glucose-Capillary: 117 mg/dL — ABNORMAL HIGH (ref 65–99)
Glucose-Capillary: 122 mg/dL — ABNORMAL HIGH (ref 65–99)
Glucose-Capillary: 132 mg/dL — ABNORMAL HIGH (ref 65–99)
Glucose-Capillary: 165 mg/dL — ABNORMAL HIGH (ref 65–99)
Glucose-Capillary: 174 mg/dL — ABNORMAL HIGH (ref 65–99)
Glucose-Capillary: 197 mg/dL — ABNORMAL HIGH (ref 65–99)

## 2016-12-02 LAB — MAGNESIUM: Magnesium: 1.8 mg/dL (ref 1.7–2.4)

## 2016-12-02 LAB — CBC WITH DIFFERENTIAL/PLATELET
Basophils Absolute: 0 10*3/uL (ref 0.0–0.1)
Basophils Relative: 0 %
Eosinophils Absolute: 0.1 10*3/uL (ref 0.0–0.7)
Eosinophils Relative: 2 %
HCT: 31.2 % — ABNORMAL LOW (ref 39.0–52.0)
Hemoglobin: 9.9 g/dL — ABNORMAL LOW (ref 13.0–17.0)
Lymphocytes Relative: 15 %
Lymphs Abs: 0.8 10*3/uL (ref 0.7–4.0)
MCH: 32.8 pg (ref 26.0–34.0)
MCHC: 31.7 g/dL (ref 30.0–36.0)
MCV: 103.3 fL — ABNORMAL HIGH (ref 78.0–100.0)
Monocytes Absolute: 0.4 10*3/uL (ref 0.1–1.0)
Monocytes Relative: 6 %
Neutro Abs: 4.2 10*3/uL (ref 1.7–7.7)
Neutrophils Relative %: 77 %
Platelets: 129 10*3/uL — ABNORMAL LOW (ref 150–400)
RBC: 3.02 MIL/uL — ABNORMAL LOW (ref 4.22–5.81)
RDW: 15 % (ref 11.5–15.5)
WBC: 5.5 10*3/uL (ref 4.0–10.5)

## 2016-12-02 LAB — COMPREHENSIVE METABOLIC PANEL
ALT: 24 U/L (ref 17–63)
AST: 20 U/L (ref 15–41)
Albumin: 2.9 g/dL — ABNORMAL LOW (ref 3.5–5.0)
Alkaline Phosphatase: 42 U/L (ref 38–126)
Anion gap: 5 (ref 5–15)
BUN: 15 mg/dL (ref 6–20)
CO2: 29 mmol/L (ref 22–32)
Calcium: 8.5 mg/dL — ABNORMAL LOW (ref 8.9–10.3)
Chloride: 102 mmol/L (ref 101–111)
Creatinine, Ser: 1.05 mg/dL (ref 0.61–1.24)
GFR calc Af Amer: >60 mL/min (ref >60)
GFR calc non Af Amer: >60 mL/min (ref >60)
Glucose, Bld: 186 mg/dL — ABNORMAL HIGH (ref 65–99)
Potassium: 3.8 mmol/L (ref 3.5–5.1)
Sodium: 136 mmol/L (ref 135–145)
Total Bilirubin: 0.6 mg/dL (ref 0.3–1.2)
Total Protein: 4.8 g/dL — ABNORMAL LOW (ref 6.5–8.1)

## 2016-12-02 LAB — VANCOMYCIN, TROUGH: Vancomycin Tr: 18 ug/mL (ref 15–20)

## 2016-12-02 MED ORDER — MAGNESIUM HYDROXIDE 400 MG/5ML PO SUSP
15.0000 mL | Freq: Every day | ORAL | Status: DC | PRN
  Administered 2016-12-02: 15 mL via ORAL
  Filled 2016-12-02: qty 30

## 2016-12-02 NOTE — Plan of Care (Signed)
  Progressing Education: Knowledge of General Education information will improve 12/02/2016 0351 - Progressing by Ardine Eng, RN Health Behavior/Discharge Planning: Ability to manage health-related needs will improve 12/02/2016 0351 - Progressing by Ardine Eng, RN Clinical Measurements: Ability to maintain clinical measurements within normal limits will improve 12/02/2016 0351 - Progressing by Ardine Eng, RN Will remain free from infection 12/02/2016 0351 - Progressing by Ardine Eng, RN Diagnostic test results will improve 12/02/2016 0351 - Progressing by Ardine Eng, RN Respiratory complications will improve 12/02/2016 0351 - Progressing by Ardine Eng, RN Cardiovascular complication will be avoided 12/02/2016 0351 - Progressing by Ardine Eng, RN Activity: Risk for activity intolerance will decrease 12/02/2016 0351 - Progressing by Ardine Eng, RN Nutrition: Adequate nutrition will be maintained 12/02/2016 0351 - Progressing by Ardine Eng, RN Coping: Level of anxiety will decrease 12/02/2016 0351 - Progressing by Ardine Eng, RN Elimination: Will not experience complications related to bowel motility 12/02/2016 0351 - Progressing by Ardine Eng, RN Will not experience complications related to urinary retention 12/02/2016 0351 - Progressing by Ardine Eng, RN Pain Managment: General experience of comfort will improve 12/02/2016 0351 - Progressing by Ardine Eng, RN Safety: Ability to remain free from injury will improve 12/02/2016 0351 - Progressing by Ardine Eng, RN Skin Integrity: Risk for impaired skin integrity will decrease 12/02/2016 0351 - Progressing by Ardine Eng, RN Education: Knowledge of disease or condition will improve 12/02/2016 0351 - Progressing by Ardine Eng, RN Understanding of medication regimen will improve 12/02/2016 0351 - Progressing by Ardine Eng,  RN Activity: Ability to tolerate increased activity will improve 12/02/2016 0351 - Progressing by Ardine Eng, RN Cardiac: Ability to achieve and maintain adequate cardiopulmonary perfusion will improve 12/02/2016 0351 - Progressing by Ardine Eng, [place] Behavior/Discharge Planning: Ability to safely manage health-related needs after discharge will improve 12/02/2016 0351 - Progressing by Ardine Eng, RN

## 2016-12-02 NOTE — Progress Notes (Signed)
Patient ID: Chase Herrera, male   DOB: December 17, 1950, 66 y.o.   MRN: [id number]          [facility]  Date of Admission:  11/28/2016           Day 5 vancomycin        Day 5 cefepime ASSESSMENT: Blood cultures done at [facility] on 11/28/2016 grew methicillin-resistant coagulase-negative staph. Records from their do not mention antibiotic therapy there but he believes that he was on treatment for pneumonia. Repeat blood cultures done here on 11/29/2016 remain negative. He recalls that blood cultures at [facility] were drawn through his Port-A-Cath. While the positive blood cultures could reflect contamination from the port I would treat him as having had true coag negative staph bacteremia and give him 2 weeks of therapy. If he needs a permanent pacemaker to control his A. fib/flutter I feel it would be safe to do that at any time.  He has had an increased cough with purulent sputum recently and new right upper lobe infiltrate. Sputum has grown Pseudomonas. I would plan on 2 more days of cefepime therapy for presumed Pseudomonas HCAP.  His CT scan shows reduction in size of the right upper lobe soft tissue mass which presumably represents his previously treated lung cancer. If his Port-A-Cath is not going to be needed for further therapy I would suggest having it removed in the near future to reduce his risk of infection.  PLAN: 1. Continue vancomycin for 9 more days 2. Continue cefepime for 2 more days  Principal Problem:   Atrial fibrillation (HCC) Active Problems:   Bacteremia due to Gram-positive bacteria   Healthcare-associated pneumonia   Essential hypertension, benign   COPD (chronic obstructive pulmonary disease) (HCC)   Small cell lung cancer ([place])   Diabetes mellitus type 2 in obese (HCC)   Scheduled Meds: . budesonide (PULMICORT) nebulizer solution  0.5 mg Nebulization BID  . diltiazem  240 mg Oral BID  . gabapentin  300 mg Oral  TID  . guaiFENesin  600 mg Oral BID  . insulin aspart  0-15 Units Subcutaneous Q4H  . insulin glargine  25 Units Subcutaneous BID  . levalbuterol  0.63 mg Nebulization QID  . metoprolol tartrate  50 mg Oral BID  . rivaroxaban  20 mg Oral Q supper  . sodium chloride flush  10-40 mL Intracatheter Q12H  . tamsulosin  0.4 mg Oral QPC supper   Continuous Infusions: . ceFEPime (MAXIPIME) IV 1 g (12/02/16 0604)  . vancomycin Stopped (12/02/16 0913)   PRN Meds:.acetaminophen, levalbuterol, magnesium hydroxide, ondansetron (ZOFRAN) IV, oxyCODONE-acetaminophen, sodium chloride flush   SUBJECTIVE: He states that he is feeling much better. He is not having anymore chest pain. His cough has improved and he is not bringing up as much purulent sputum.  Review of Systems: Review of Systems  Constitutional: Negative for chills, diaphoresis and fever.  Respiratory: Positive for cough and sputum production. Negative for shortness of breath and wheezing.   Cardiovascular: Negative for chest pain.    Allergies  Allergen Reactions  . Codeine Other (See Comments) and Itching    OBJECTIVE: Vitals:   12/02/16 0238 12/02/16 0613 12/02/16 0838 12/02/16 1217  BP: (!) 101/58 90/63 110/61 111/67  Pulse: (!) 102 (!) 112 (!) 110 87  Resp: 18 18  20   Temp: 98.4 F (36.9 C) 98.8 F (37.1 C)  98.2 F (36.8 C)  TempSrc: Oral Oral  Oral  SpO2:  98% 96% 98%  Weight:  277 lb 9.6 oz (125.9 kg)    Height:       Body mass index is 37.65 kg/m.  Physical Exam  Constitutional: He is oriented to person, place, and time.  He is sitting up on the side of the bed eating lunch. He is in no distress.  Cardiovascular:  Very distant heart sounds. No murmur heard  Pulmonary/Chest: Effort normal and breath sounds normal. He has no wheezes. He has no rales.  Port-A-Cath site looks good  Neurological: He is alert and oriented to person, place, and time.  Psychiatric: Mood and affect normal.    Lab Results Lab  Results  Component Value Date   WBC 5.5 12/02/2016   HGB 9.9 (L) 12/02/2016   HCT 31.2 (L) 12/02/2016   MCV 103.3 (H) 12/02/2016   PLT 129 (L) 12/02/2016    Lab Results  Component Value Date   CREATININE 1.05 12/02/2016   BUN 15 12/02/2016   NA 136 12/02/2016   K 3.8 12/02/2016   CL 102 12/02/2016   CO2 29 12/02/2016    Lab Results  Component Value Date   ALT 24 12/02/2016   AST 20 12/02/2016   ALKPHOS 42 12/02/2016   BILITOT 0.6 12/02/2016     Microbiology: Recent Results (from the past 240 hour(s))  Culture, sputum-assessment     Status: None   Collection Time: 11/29/16  5:23 AM  Result Value Ref Range Status   Specimen Description SPUTUM  Final   Special Requests NONE  Final   Sputum evaluation THIS SPECIMEN IS ACCEPTABLE FOR SPUTUM CULTURE  Final   Report Status 11/29/2016 FINAL  Final  Culture, respiratory (NON-Expectorated)     Status: None   Collection Time: 11/29/16  5:23 AM  Result Value Ref Range Status   Specimen Description SPUTUM  Final   Special Requests NONE Reflexed from [id number]  Final   Gram Stain   Final    ABUNDANT WBC PRESENT, PREDOMINANTLY PMN ABUNDANT GRAM NEGATIVE RODS MODERATE GRAM POSITIVE RODS MODERATE GRAM POSITIVE COCCI    Culture   Final    ABUNDANT PSEUDOMONAS AERUGINOSA WITHIN MIXED BACTERIAL ORGANISMS    Report Status 12/01/2016 FINAL  Final   Organism ID, Bacteria PSEUDOMONAS AERUGINOSA  Final      Susceptibility   Pseudomonas aeruginosa - MIC*    CEFTAZIDIME 4 SENSITIVE Sensitive     CIPROFLOXACIN <=0.25 SENSITIVE Sensitive     GENTAMICIN <=1 SENSITIVE Sensitive     IMIPENEM 2 SENSITIVE Sensitive     PIP/TAZO 8 SENSITIVE Sensitive     CEFEPIME 4 SENSITIVE Sensitive     * ABUNDANT PSEUDOMONAS AERUGINOSA  Culture, blood (routine x 2)     Status: None (Preliminary result)   Collection Time: 11/29/16  6:15 AM  Result Value Ref Range Status   Specimen Description BLOOD RIGHT ANTECUBITAL  Final   Special Requests   Final     BOTTLES DRAWN AEROBIC AND ANAEROBIC Blood Culture adequate volume   Culture NO GROWTH 3 DAYS  Final   Report Status PENDING  Incomplete  Culture, blood (routine x 2)     Status: None (Preliminary result)   Collection Time: 11/29/16  6:30 AM  Result Value Ref Range Status   Specimen Description BLOOD RIGHT FOREARM  Final   Special Requests   Final    BOTTLES DRAWN AEROBIC AND ANAEROBIC Blood Culture adequate volume   Culture NO GROWTH 3 DAYS  Final   Report Status PENDING  Incomplete  Culture, blood (routine x 2)     Status: None (Preliminary result)   Collection Time: 11/30/16 11:00 AM  Result Value Ref Range Status   Specimen Description BLOOD RIGHT HAND  Final   Special Requests   Final    BOTTLES DRAWN AEROBIC ONLY Blood Culture adequate volume   Culture NO GROWTH 2 DAYS  Final   Report Status PENDING  Incomplete  Culture, blood (routine x 2)     Status: None (Preliminary result)   Collection Time: 11/30/16 11:07 AM  Result Value Ref Range Status   Specimen Description BLOOD LEFT HAND  Final   Special Requests   Final    BOTTLES DRAWN AEROBIC ONLY Blood Culture adequate volume   Culture NO GROWTH 2 DAYS  Final   Report Status PENDING  Incomplete   CT chest with contrast 12/01/2016  IMPRESSION: 1. New peribronchial nodularity and ground-glass opacities in the RIGHT upper lobe suggest pulmonary infection or inflammation. Lung cancer recurrence is less favored. 2. Continued contraction of RIGHT upper lobe mass. 3. Paraseptal and centrilobular emphysema. 4. Coronary artery calcification and Aortic Atherosclerosis (ICD10-I70.0).   Electronically Signed   By: Suzy Bouchard M.D.   On: 12/02/2016 10:19   Michel Bickers, [place] [facility] [phone] pager   [phone] cell 12/02/2016, 12:57 PM

## 2016-12-02 NOTE — Progress Notes (Signed)
Progress Note  Patient Name: Chase Herrera Date of Encounter: 12/02/2016  Primary Cardiologist: Dr. [name]   Patient Profile     66 y.o. male with a hx of AFlutter ablated 2013, developed AFib?, severe O2 dependent COPD, hx of RUL small cell cancer ? Brain mets  (s/p Rad Tx/chemo, patient states completed therapy), he reports unable to have surgery 2/2 severe COPD, quit smoking 3 years ago, DM, HTN, admitted with AFlutter,RVR  + blood cultures,at [place] 2/2 g + cocci  pneumonia? Seen by [facility] felt not candidate  for AAD so rate control would be strategy.  He failed to maintain sinus rhythm on dofetilide initiated at [facility] ID has weighed in on his + BC;drawn through [facility] a Cath.  Two weeks of therapy but pacing can be done whenever  On ASA and Rivaroxaban   Subjective   Feels a little better  Inpatient Medications    Scheduled Meds: . budesonide (PULMICORT) nebulizer solution  0.5 mg Nebulization BID  . diltiazem  240 mg Oral BID  . gabapentin  300 mg Oral TID  . guaiFENesin  600 mg Oral BID  . insulin aspart  0-15 Units Subcutaneous Q4H  . insulin glargine  25 Units Subcutaneous BID  . levalbuterol  0.63 mg Nebulization QID  . metoprolol tartrate  50 mg Oral BID  . rivaroxaban  20 mg Oral Q supper  . sodium chloride flush  10-40 mL Intracatheter Q12H  . tamsulosin  0.4 mg Oral QPC supper   Continuous Infusions: . ceFEPime (MAXIPIME) IV Stopped (12/02/16 1343)  . vancomycin Stopped (12/02/16 0913)   PRN Meds: acetaminophen, levalbuterol, magnesium hydroxide, ondansetron (ZOFRAN) IV, oxyCODONE-acetaminophen, sodium chloride flush   Vital Signs    Vitals:   12/02/16 0238 12/02/16 0613 12/02/16 0838 12/02/16 1217  BP: (!) 101/58 90/63 110/61 111/67  Pulse: (!) 102 (!) 112 (!) 110 87  Resp: 18 18  20   Temp: 98.4 F (36.9 C) 98.8 F (37.1 C)  98.2 F (36.8 C)  TempSrc: Oral Oral  Oral  SpO2:  98% 96% 98%  Weight:  277 lb 9.6 oz (125.9 kg)    Height:         Intake/Output Summary (Last 24 hours) at 12/02/2016 1648 Last data filed at 12/02/2016 1437 Gross per 24 hour  Intake 2150 ml  Output 1775 ml  Net 375 ml   Filed Weights   11/30/16 0406 12/01/16 0514 12/02/16 0613  Weight: 272 lb (123.4 kg) 274 lb 4.8 oz (124.4 kg) 277 lb 9.6 oz (125.9 kg)    Telemetry    Aflutter with rates 120+/-10 Personally Reviewed  ECG     Physical Exam   Well developed and nourished in no acute distress HENT normal Neck supple with JVP-flat Coarse ronchi Regular rate and rhythm, no murmurs or gallops Abd-soft with active BS without hepatomegaly No Clubbing cyanosis edema Skin-warm and dry A & Oriented  Grossly normal sensory and motor function    Labs    Chemistry Recent Labs  Lab 11/28/16 2349 11/29/16 0613 12/02/16 0455  NA 136 137 136  K 4.1 3.8 3.8  CL 102 103 102  CO2 27 28 29   GLUCOSE 197* 153* 186*  BUN 15 13 15   CREATININE 1.04 0.97 1.05  CALCIUM 8.9 8.9 8.5*  PROT 5.2*  --  4.8*  ALBUMIN 2.8*  --  2.9*  AST 22  --  20  ALT 23  --  24  ALKPHOS 47  --  67  BILITOT 0.6  --  0.6  GFRNONAA >60 >60 >60  GFRAA >60 >60 >60  ANIONGAP 7 6 5      Hematology Recent Labs  Lab 11/28/16 2349 11/29/16 0613 12/02/16 0455  WBC 6.6 5.0 5.5  RBC 3.20* 3.28* 3.02*  HGB 10.4* 10.7* 9.9*  HCT 32.8* 33.8* 31.2*  MCV 102.5* 103.0* 103.3*  MCH 32.5 32.6 32.8  MCHC 31.7 31.7 31.7  RDW 14.6 14.9 15.0  PLT 141* 145* 129*    Cardiac Enzymes Recent Labs  Lab 11/28/16 2349  TROPONINI <0.03   No results for input(s): TROPIPOC in the last 168 hours.   BNPNo results for input(s): BNP, PROBNP in the last 168 hours.   DDimer No results for input(s): DDIMER in the last 168 hours.   Radiology    Ct Chest W Contrast  Result Date: 12/02/2016 CLINICAL DATA:  Small cell lung cancer EXAM: CT CHEST WITH CONTRAST TECHNIQUE: Multidetector CT imaging of the chest was performed during intravenous contrast administration. CONTRAST:   53mL ISOVUE-300 IOPAMIDOL (ISOVUE-300) INJECTION 61% COMPARISON:  PET-CT 08/21/2016, 04/11/2016 FINDINGS: Cardiovascular: Coronary artery calcification and aortic atherosclerotic calcification. Mediastinum/Nodes: No axillary supraclavicular adenopathy. Port in the RIGHT chest wall. No mediastinal hilar adenopathy. Esophagus normal. No pericardial effusion. Lungs/Pleura: RIGHT upper lobe mass measures 2.8 x 1.6 cm compared with 3.2 by 1.7 cm on most recent PET-CT scan. This is site of prior large hypermetabolic mass measuring up to 6.5 cm. Bronchiectasis in the RIGHT upper lobe. There is new peribronchial nodules and ground-glass opacity in the RIGHT upper lobe (image 58, series 4) in an infectious or inflammatory pattern. Centrilobular emphysema paraseptal emphysema the upper lobes. Linear atelectasis at the LEFT lung base. Upper Abdomen: Limited view of the liver, kidneys, pancreas are unremarkable. Normal adrenal glands. Musculoskeletal: No aggressive osseous lesion. IMPRESSION: 1. New peribronchial nodularity and ground-glass opacities in the RIGHT upper lobe suggest pulmonary infection or inflammation. Lung cancer recurrence is less favored. 2. Continued contraction of RIGHT upper lobe mass. 3. Paraseptal and centrilobular emphysema. 4. Coronary artery calcification and Aortic Atherosclerosis (ICD10-I70.0). Electronically Signed   By: Suzy Bouchard M.D.   On: 12/02/2016 10:19    Cardiac Studies   11/15/16: TTE Study Conclusions - Left ventricle: The cavity size was normal. Wall thickness was normal. Systolic function was normal. The estimated ejection fraction was in the range of 60% to 65%. Left ventricular diastolic function parameters were normal. - Aortic valve: Valve area (VTI): 3.65 cm^2. Valve area (Vmax): 3.42 cm^2. Valve area (Vmean): 3.4 cm^2.     Assessment & Plan    1.  AFlutter, atypical      Hx of flutter ablation, record indicates subsequently has had AFib       CHA2DS2Vasc is at least 2, on Xarelto appropriately dosed here at 20mg     2. + blood cultures at [facility] 2/2 with gram + cocci    3. Severe COPD     Hx of lung Ca treated, ??brain METS 4. ? pneumonia 5. Lung Cancer s/p Rad and chemo 6. HTN   Rate control strategies are going to be problematic as hypotension precludes up titration of medications.  He is averse to amiodarone as his wife died of pulmonary fibrosis and he is aware of the pulmonary toxicities of amiodarone.  His heart rates have been fast for months.     I think we will come to AV ablation and pacing.  It would be nice prior to device implantation get his  Port-A-Cath out.  Given the chronicity of his tachycardia I think we have the leeway to have him follow-up with oncology  He needs another 10 days of IV antibiotics.  I would hope that we can get the Port-A-Cath out after that; Dr. Greggory Brandy could then implant a pacemaker thereafter  I appreciate Dr. Hale Bogus insights into the x-rays.Marland Kitchen

## 2016-12-02 NOTE — Progress Notes (Signed)
Pharmacy Antibiotic Note  Chase Herrera is a 66 y.o. male admitted on 11/28/2016 with pneumonia.  Pharmacy has been consulted for Vancomycin/Zosyn dosing. Pt is a transfer from [facility] due to cardiac issues. WBC WNL. Renal function ok. Tmax 99.1.   11/11 AM: Vancomycin trough is therapeutic at 18  Plan: Cont Vancomycin 1250 mg IV q12h Cont Cefepime 1g IV q8h per MD Trend WBC, temp, renal function  F/U infectious work-up Re-check drug levels as indicated   Height: 6' (182.9 cm) Weight: 274 lb 4.8 oz (124.4 kg) IBW/kg (Calculated) : 77.6  Temp (24hrs), Avg:98.4 F (36.9 C), Min:98.3 F (36.8 C), Max:98.6 F (37 C)  Recent Labs  Lab 11/28/16 2349 11/29/16 0613 12/02/16 0455  WBC 6.6 5.0 5.5  CREATININE 1.04 0.97  --   VANCOTROUGH  --   --  18    Estimated Creatinine Clearance: 104.8 mL/min (by C-G formula based on SCr of 0.97 mg/dL).    Allergies  Allergen Reactions  . Codeine Other (See Comments) and Itching    Narda Bonds 12/02/2016 5:48 AM

## 2016-12-02 NOTE — Progress Notes (Signed)
PROGRESS NOTE    Chase Herrera  HGD:[id number] DOB: Jul 02, 1950 DOA: 11/28/2016 PCP: Neale Burly, MD   Brief Narrative 66 y.o.malewith medical history significant ofSCLC(s/plobectomy, chemotherapy, and radiation therapy), tobacco abuse, COPD, oxygen dependent, atrial flutters/pablation on Xarelto;who [facility]as a [facility] complaints of shortness of breath and chest pain.He reports that he hadjust recently beenadmitted to thehospital for pneumonia last week and had still beentakingantibiotics. While at home for the last 2 days patient reported worseningcomplaints of shortness of breath and continued productive cough with greenish sputum.He tried touse his inhaler at home, butreported feeling dizzy and palpitations.Due to the symptoms he drove himself to [place], and while driving there developed chest pain. Chest pain symptoms resolved on their own. In the emergency department he was noted to be in atrial flutter and there was concern for the patient having anacute STEMI based off EKG tracings. Troponins were negative. Chest x-ray continued to show a right lobe pneumonia similar to previous admission for pneumonia. Patient was given aspirin and 10 mg of diltiazem with improvement of heart rate. Cardiology service was consulted here at [facility] and accepted the patient to a telemetry bed. Upon arrival cardiology evaluated the patient and noted that patient's symptoms had resolved. They recommended admission to [facility] at that time given other medical problems. [facility] blood cultures had grown gram-positive cocci.  CT CHEST DONE YESTERDAY-1. New peribronchial nodularity and ground-glass opacities in the RIGHT upper lobe suggest pulmonary infection or inflammation. Lung cancer recurrence is less favored. 2. Continued contraction of RIGHT upper lobe mass. 3.  Paraseptal and centrilobular emphysema. 4. Coronary artery calcification and Aortic Atherosclerosis Sputum culture showing Pseudomonas sensitive to cefepime. Assessment & Plan:   Principal Problem:   Atrial fibrillation (HCC) Active Problems:   Essential hypertension, benign   COPD (chronic obstructive pulmonary disease) (HCC)   Small cell lung cancer ([place])   Diabetes mellitus type 2 in obese ([place])   Bacteremia due to Gram-positive bacteria   Healthcare-associated pneumonia  Recurrent afib/flutter - per cards.  Currently on Cardizem.  Notes about thoughts of putting a pacemaker noted.  Once infection is completely cleared.  Monitor electrolytes renal functions.  Pneumonia/bronchitis-on vancomycin and cefepime.  Pharmacist [facility]itzi [facility] and [facility] to [facility]ist there and found that his blood cultures 2 out of 2 is growing staph hominis sensitive to rifampin gentamicin and vancomycin.  I am just waiting to get a faxed copy of his labs.  ID notes reviewed and appreciated.CT CHEST NOTED.PERISISTENT RUL PROCESS?INFECTION VS INFLAMTION.  2 diabetes continue Lantus  DVT prophylaxis: Xarelto Code Status: Full code Family Communication: None Disposition Plan:  TBD  Consultants: Cardiology, infectious disease  Procedures: None Antimicrobials:  Vanco and cefepime Subjective: Sitting with side of the bed denies any worsening chest pain shortness of breath or cough objective: She did no acute distress answer all my questions appropriately and talking in full sentences. Vitals:   12/02/16 0238 12/02/16 0613 12/02/16 0838 12/02/16 1217  BP: (!) 101/58 90/63 110/61 111/67  Pulse: (!) 102 (!) 112 (!) 110 87  Resp: 18 18  20   Temp: 98.4 F (36.9 C) 98.8 F (37.1 C)  98.2 F (36.8 C)  TempSrc: Oral Oral  Oral  SpO2:  98% 96% 98%  Weight:  125.9 kg (277 lb 9.6 oz)    Height:        Intake/Output Summary (Last 24 hours) at  12/02/2016 1253 Last data filed at 12/02/2016  1219 Gross per 24 hour  Intake 2150 ml  Output 2175 ml  Net -25 ml   Filed Weights   11/30/16 0406 12/01/16 0514 12/02/16 0613  Weight: 123.4 kg (272 lb) 124.4 kg (274 lb 4.8 oz) 125.9 kg (277 lb 9.6 oz)    Examination:  General exam: Appears calm and comfortable  Respiratory system: Clear to auscultation. Respiratory effort normal. Cardiovascular system: S1 & S2 heard, RRR. No JVD, murmurs, rubs, gallops or clicks. No pedal edema. Gastrointestinal system: Abdomen is nondistended, soft and nontender. No organomegaly or masses felt. Normal bowel sounds heard. Central nervous system: Alert and oriented. No focal neurological deficits. Extremities: Symmetric 5 x 5 power. Skin: No rashes, lesions or ulcers Psychiatry: Judgement and insight appear normal. Mood & affect appropriate.     Data Reviewed: I have personally reviewed following labs and imaging studies  CBC: Recent Labs  Lab 11/28/16 2349 11/29/16 0613 12/02/16 0455  WBC 6.6 5.0 5.5  NEUTROABS  --   --  4.2  HGB 10.4* 10.7* 9.9*  HCT 32.8* 33.8* 31.2*  MCV 102.5* 103.0* 103.3*  PLT 141* 145* 161*   Basic Metabolic Panel: Recent Labs  Lab 11/28/16 2349 11/29/16 0613 12/02/16 0455  NA 136 137 136  K 4.1 3.8 3.8  CL 102 103 102  CO2 27 28 29   GLUCOSE 197* 153* 186*  BUN 15 13 15   CREATININE 1.04 0.97 1.05  CALCIUM 8.9 8.9 8.5*  MG  --  1.6* 1.8   GFR: Estimated Creatinine Clearance: 97.4 mL/min (by C-G formula based on SCr of 1.05 mg/dL). Liver Function Tests: Recent Labs  Lab 11/28/16 2349 12/02/16 0455  AST 22 20  ALT 23 24  ALKPHOS 47 42  BILITOT 0.6 0.6  PROT 5.2* 4.8*  ALBUMIN 2.8* 2.9*   No results for input(s): LIPASE, AMYLASE in the last 168 hours. No results for input(s): AMMONIA in the last 168 hours. Coagulation Profile: No results for input(s): INR, PROTIME in the last 168 hours. Cardiac Enzymes: Recent Labs  Lab 11/28/16 2349  TROPONINI <0.03   BNP (last 3 results) No  results for input(s): PROBNP in the last 8760 hours. HbA1C: No results for input(s): HGBA1C in the last 72 hours. CBG: Recent Labs  Lab 12/01/16 2359 12/02/16 0052 12/02/16 0434 12/02/16 0730 12/02/16 1126  GLUCAP 126* 132* 174* 114* 165*   Lipid Profile: No results for input(s): CHOL, HDL, LDLCALC, TRIG, CHOLHDL, LDLDIRECT in the last 72 hours. Thyroid Function Tests: No results for input(s): TSH, T4TOTAL, FREET4, T3FREE, THYROIDAB in the last 72 hours. Anemia Panel: No results for input(s): VITAMINB12, FOLATE, FERRITIN, TIBC, IRON, RETICCTPCT in the last 72 hours. Sepsis Labs: No results for input(s): PROCALCITON, LATICACIDVEN in the last 168 hours.  Recent Results (from the past 240 hour(s))  Culture, sputum-assessment     Status: None   Collection Time: 11/29/16  5:23 AM  Result Value Ref Range Status   Specimen Description SPUTUM  Final   Special Requests NONE  Final   Sputum evaluation THIS SPECIMEN IS ACCEPTABLE FOR SPUTUM CULTURE  Final   Report Status 11/29/2016 FINAL  Final  Culture, respiratory (NON-Expectorated)     Status: None   Collection Time: 11/29/16  5:23 AM  Result Value Ref Range Status   Specimen Description SPUTUM  Final   Special Requests NONE Reflexed from [id number]  Final   Gram Stain   Final    ABUNDANT WBC PRESENT, PREDOMINANTLY  PMN ABUNDANT GRAM NEGATIVE RODS MODERATE GRAM POSITIVE RODS MODERATE GRAM POSITIVE COCCI    Culture   Final    ABUNDANT PSEUDOMONAS AERUGINOSA WITHIN MIXED BACTERIAL ORGANISMS    Report Status 12/01/2016 FINAL  Final   Organism ID, Bacteria PSEUDOMONAS AERUGINOSA  Final      Susceptibility   Pseudomonas aeruginosa - MIC*    CEFTAZIDIME 4 SENSITIVE Sensitive     CIPROFLOXACIN <=0.25 SENSITIVE Sensitive     GENTAMICIN <=1 SENSITIVE Sensitive     IMIPENEM 2 SENSITIVE Sensitive     PIP/TAZO 8 SENSITIVE Sensitive     CEFEPIME 4 SENSITIVE Sensitive     * ABUNDANT PSEUDOMONAS AERUGINOSA  Culture, blood (routine x 2)      Status: None (Preliminary result)   Collection Time: 11/29/16  6:15 AM  Result Value Ref Range Status   Specimen Description BLOOD RIGHT ANTECUBITAL  Final   Special Requests   Final    BOTTLES DRAWN AEROBIC AND ANAEROBIC Blood Culture adequate volume   Culture NO GROWTH 3 DAYS  Final   Report Status PENDING  Incomplete  Culture, blood (routine x 2)     Status: None (Preliminary result)   Collection Time: 11/29/16  6:30 AM  Result Value Ref Range Status   Specimen Description BLOOD RIGHT FOREARM  Final   Special Requests   Final    BOTTLES DRAWN AEROBIC AND ANAEROBIC Blood Culture adequate volume   Culture NO GROWTH 3 DAYS  Final   Report Status PENDING  Incomplete  Culture, blood (routine x 2)     Status: None (Preliminary result)   Collection Time: 11/30/16 11:00 AM  Result Value Ref Range Status   Specimen Description BLOOD RIGHT HAND  Final   Special Requests   Final    BOTTLES DRAWN AEROBIC ONLY Blood Culture adequate volume   Culture NO GROWTH 2 DAYS  Final   Report Status PENDING  Incomplete  Culture, blood (routine x 2)     Status: None (Preliminary result)   Collection Time: 11/30/16 11:07 AM  Result Value Ref Range Status   Specimen Description BLOOD LEFT HAND  Final   Special Requests   Final    BOTTLES DRAWN AEROBIC ONLY Blood Culture adequate volume   Culture NO GROWTH 2 DAYS  Final   Report Status PENDING  Incomplete         Radiology Studies: Dg Chest 2 View  Result Date: 11/30/2016 CLINICAL DATA:  History of lung carcinoma with shortness of breath, initial encounter EXAM: CHEST  2 VIEW COMPARISON:  11/28/2016 FINDINGS: Cardiac shadow is stable. Right-sided chest port is noted in satisfactory position. The Huber needle within the port reservoir appears somewhat superiorly placed on the frontal film but appears appropriately placed on the lateral film. This is likely projectional in nature. The right lung remains clear with the exception of patchy changes in  the medial aspect of the right apex. Stable changes in the left base are again seen. No sizable effusion is noted. No acute bony abnormality is seen. IMPRESSION: Stable patchy infiltrative changes in the left base and medial right apex when compared with the prior exam. No new focal abnormality is noted. Electronically Signed   By: Inez Catalina M.D.   On: 11/30/2016 16:03   Ct Chest W Contrast  Result Date: 12/02/2016 CLINICAL DATA:  Small cell lung cancer EXAM: CT CHEST WITH CONTRAST TECHNIQUE: Multidetector CT imaging of the chest was performed during intravenous contrast administration. CONTRAST:  62mL ISOVUE-300 IOPAMIDOL (  ISOVUE-300) INJECTION 61% COMPARISON:  PET-CT 08/21/2016, 04/11/2016 FINDINGS: Cardiovascular: Coronary artery calcification and aortic atherosclerotic calcification. Mediastinum/Nodes: No axillary supraclavicular adenopathy. Port in the RIGHT chest wall. No mediastinal hilar adenopathy. Esophagus normal. No pericardial effusion. Lungs/Pleura: RIGHT upper lobe mass measures 2.8 x 1.6 cm compared with 3.2 by 1.7 cm on most recent PET-CT scan. This is site of prior large hypermetabolic mass measuring up to 6.5 cm. Bronchiectasis in the RIGHT upper lobe. There is new peribronchial nodules and ground-glass opacity in the RIGHT upper lobe (image 58, series 4) in an infectious or inflammatory pattern. Centrilobular emphysema paraseptal emphysema the upper lobes. Linear atelectasis at the LEFT lung base. Upper Abdomen: Limited view of the liver, kidneys, pancreas are unremarkable. Normal adrenal glands. Musculoskeletal: No aggressive osseous lesion. IMPRESSION: 1. New peribronchial nodularity and ground-glass opacities in the RIGHT upper lobe suggest pulmonary infection or inflammation. Lung cancer recurrence is less favored. 2. Continued contraction of RIGHT upper lobe mass. 3. Paraseptal and centrilobular emphysema. 4. Coronary artery calcification and Aortic Atherosclerosis (ICD10-I70.0).  Electronically Signed   By: Suzy Bouchard M.D.   On: 12/02/2016 10:19        Scheduled Meds: . budesonide (PULMICORT) nebulizer solution  0.5 mg Nebulization BID  . diltiazem  240 mg Oral BID  . gabapentin  300 mg Oral TID  . guaiFENesin  600 mg Oral BID  . insulin aspart  0-15 Units Subcutaneous Q4H  . insulin glargine  25 Units Subcutaneous BID  . levalbuterol  0.63 mg Nebulization QID  . metoprolol tartrate  50 mg Oral BID  . rivaroxaban  20 mg Oral Q supper  . sodium chloride flush  10-40 mL Intracatheter Q12H  . tamsulosin  0.4 mg Oral QPC supper   Continuous Infusions: . ceFEPime (MAXIPIME) IV 1 g (12/02/16 0604)  . vancomycin Stopped (12/02/16 0913)     LOS: 4 days      Georgette Shell, MD [facility]  If 7PM-7AM, please contact night-coverage [email or web address] Password [facility] 12/02/2016, 12:53 PM

## 2016-12-03 ENCOUNTER — Ambulatory Visit (HOSPITAL_COMMUNITY): Payer: Medicare Other

## 2016-12-03 DIAGNOSIS — Y95 Nosocomial condition: Secondary | ICD-10-CM

## 2016-12-03 DIAGNOSIS — I482 Chronic atrial fibrillation: Secondary | ICD-10-CM

## 2016-12-03 DIAGNOSIS — J151 Pneumonia due to Pseudomonas: Secondary | ICD-10-CM

## 2016-12-03 LAB — BASIC METABOLIC PANEL
Anion gap: 7 (ref 5–15)
BUN: 13 mg/dL (ref 6–20)
CO2: 27 mmol/L (ref 22–32)
Calcium: 8.8 mg/dL — ABNORMAL LOW (ref 8.9–10.3)
Chloride: 103 mmol/L (ref 101–111)
Creatinine, Ser: 0.97 mg/dL (ref 0.61–1.24)
GFR calc Af Amer: >60 mL/min (ref >60)
GFR calc non Af Amer: >60 mL/min (ref >60)
Glucose, Bld: 185 mg/dL — ABNORMAL HIGH (ref 65–99)
Potassium: 4.2 mmol/L (ref 3.5–5.1)
Sodium: 137 mmol/L (ref 135–145)

## 2016-12-03 LAB — GLUCOSE, CAPILLARY
Glucose-Capillary: 187 mg/dL — ABNORMAL HIGH (ref 65–99)
Glucose-Capillary: 116 mg/dL — ABNORMAL HIGH (ref 65–99)
Glucose-Capillary: 258 mg/dL — ABNORMAL HIGH (ref 65–99)
Glucose-Capillary: 107 mg/dL — ABNORMAL HIGH (ref 65–99)

## 2016-12-03 MED ORDER — OXYCODONE-ACETAMINOPHEN 5-325 MG PO TABS: 1.0000 | ORAL_TABLET | Freq: Four times a day (QID) | ORAL | 0 refills | Status: DC | PRN

## 2016-12-03 MED ORDER — HEPARIN SOD (PORK) LOCK FLUSH 100 UNIT/ML IV SOLN
500.0000 [IU] | INTRAVENOUS | Status: AC | PRN
  Administered 2016-12-03: 500 [IU]

## 2016-12-03 MED ORDER — FUROSEMIDE 10 MG/ML IJ SOLN
40.0000 mg | Freq: Once | INTRAMUSCULAR | Status: AC
  Administered 2016-12-03: 40 mg via INTRAVENOUS
  Filled 2016-12-03: qty 4

## 2016-12-03 MED ORDER — METOPROLOL TARTRATE 50 MG PO TABS: 50.0000 mg | ORAL_TABLET | Freq: Every day | ORAL | Status: DC

## 2016-12-03 MED ORDER — METOPROLOL TARTRATE 50 MG PO TABS: 75.0000 mg | ORAL_TABLET | Freq: Every day | ORAL | Status: DC

## 2016-12-03 MED ORDER — METOPROLOL TARTRATE 50 MG PO TABS
75.0000 mg | ORAL_TABLET | Freq: Every morning | ORAL | Status: DC
  Administered 2016-12-03: 75 mg via ORAL
  Filled 2016-12-03: qty 1

## 2016-12-03 MED ORDER — VANCOMYCIN IV (FOR PTA / DISCHARGE USE ONLY): 1250.0000 mg | Freq: Two times a day (BID) | INTRAVENOUS | 0 refills | Status: AC

## 2016-12-03 NOTE — Progress Notes (Signed)
PHARMACY CONSULT NOTE FOR:  OUTPATIENT  PARENTERAL ANTIBIOTIC THERAPY (OPAT)  Indication: CONS bacteremia Regimen: Vanc 1250 q12 End date: 12/11/16  IV antibiotic discharge orders are pended. To discharging provider:  please sign these orders via discharge navigator,  Select New Orders & click on the button choice - Manage This Unsigned Work.     Thank you for allowing pharmacy to be a part of this patient's care.  Wynell Balloon 12/03/2016, 1:44 PM

## 2016-12-03 NOTE — Consult Note (Signed)
   [facility] CM Inpatient Consult   12/03/2016  LACOREY BRUSCA Jun 23, 1950 [id number]  Patient screened for potential [place] Management services. Patient is in network with  [place] Management services under patient's [place].  Patient had been assigned EMMI Pneumonia follow up calls by inpatient RNCM.  Met with the patient at the bedside to explain follow up calls and [place] Management services.  Patient state he has a para medicine person that checks on him weekly and confirms that his primary care provider is Dr. Stoney Bang. Patient states he was admitted with ongoing pneumonia and found that he has a blood infection as well.  He will have [facility] for home IV antibiotics.   Please place a [facility] Care Management consult or for questions contact:   Natividad Brood, RN BSN [facility] Liaison  [phone] business mobile phone Toll free office [phone]

## 2016-12-03 NOTE — Discharge Summary (Addendum)
Physician Discharge Summary  Chase Herrera GMW:[id number] DOB: Nov 16, 1950 DOA: 11/28/2016  PCP: Neale Burly, MD  Admit date: 11/28/2016 Discharge date: 12/03/2016  Admitted From home :Disposition home  Recommendations for Outpatient Follow-up:  1. Follow up with PCP in 1-2 weeks 2. Please obtain BMP/CBC in one week  Home Health:yes Equipment/[employer] Discharge Condition:stable CODE STATUS:full Diet recommendation:cardiac modified carb Brief/Interim Summary:66 y.o.malewith medical history significant ofSCLC(s/plobectomy, chemotherapy, and radiation therapy), tobacco abuse, COPD, oxygen dependent, atrial flutters/pablation on Xarelto;[facility] of [facility].He reports that he hadjust recently beenadmitted to thehospital for pneumonia last week and had still beentakingantibiotics. While at home for the last 2 days patient reported worseningcomplaints of shortness of breath and continued productive cough with greenish sputum.He tried touse his inhaler at home, butreported feeling dizzy and palpitations.Due to the symptoms he drove himself to [place], and while driving there developed chest pain. Chest pain symptoms resolved on their own. In the emergency department he was noted to be in atrial flutter and there was concern for the patient having anacute STEMI based off EKG tracings. Troponins were negative. Chest x-ray continued to show a right lobe pneumonia similar to previous admission for pneumonia. Patient was given aspirin and 10 mg of diltiazem with improvement of heart rate. Cardiology service was consulted here at [facility] and accepted the patient to a telemetry bed. Upon arrival cardiology evaluated the patient and noted that patient's symptoms had resolved. They recommended admission to [facility] at that time given other medical problems. [facility] sets [facility] blood cultures had grown gram-positive cocci.  CT CHEST DONE YESTERDAY-1. New peribronchial nodularity and ground-glass opacities in the RIGHT upper lobe suggest pulmonary infection or inflammation. Lung cancer recurrence is less favored. 2. Continued contraction of RIGHT upper lobe mass. 3. Paraseptal and centrilobular emphysema. 4. Coronary artery calcification and Aortic Atherosclerosis Sputum culture showing Pseudomonas sensitive to cefepime.    Discharge Diagnoses:  Principal Problem:   Atrial fibrillation ([place]) Active Problems:   Essential hypertension, benign   COPD (chronic obstructive pulmonary disease) (HCC)   Small cell lung cancer ([place])   Diabetes mellitus type 2 in obese ([place])   Bacteremia due to Gram-positive bacteria   Healthcare-associated pneumonia  Recurrent afib/flutter - per cards.Currently on Cardizem. Notes about thoughts of putting a pacemaker noted. Once infection is completely cleared. Monitor electrolytes renal functions.  Pneumonia/bronchitis/staph hominis bacteremia-vanco for 2 weeks. 2 diabetes continue Lantus    Discharge Instructions  Discharge Instructions    Home infusion instructions [facility] May follow [facility] Dosing Protocol; May administer Cathflo as needed to maintain patency of vascular access device.; Flushing of vascular access device: per [facility] Protocol: 0.9% NaCl pre/post medica...   Complete by:  As directed    Instructions:  May follow [place] Dosing Protocol   Instructions:  May administer Cathflo as needed to maintain patency of vascular access device.   Instructions:  Flushing of vascular access device: per [facility] Protocol: 0.9% NaCl pre/post medication administration and prn patency; Heparin 100 u/ml, 27m for implanted ports and Heparin 10u/ml, 56mfor all other central venous catheters.   Instructions:  May follow [facility] Anaphylaxis Protocol for First  Dose Administration in the home: 0.9% NaCl at 25-50 ml/hr to maintain IV access for protocol meds. Epinephrine 0.3 ml IV/IM PRN and Benadryl 25-50 IV/IM PRN s/s of anaphylaxis.   Instructions:  [facility] Infusion Coordinator (RN) to assist per patient IV care needs in the home PRN.     Allergies as of 12/03/2016      Reactions   Codeine Other (See Comments), Itching      Medication List    STOP taking these medications   insulin lispro 100 UNIT/ML injection Commonly known as:  HUMALOG   metformin 500 MG (OSM) 24 hr tablet Commonly known as:  FORTAMET     TAKE these medications   acetaminophen 325 MG tablet Commonly known as:  TYLENOL Take every 6 (six) hours as needed by mouth.   albuterol 108 (90 Base) MCG/ACT inhaler Commonly known as:  PROVENTIL HFA;VENTOLIN HFA Inhale 2 puffs into the lungs every 6 (six) hours as needed. For shortness of breath   aspirin EC 81 MG tablet Take 81 mg by mouth daily.   diltiazem 360 MG 24 hr capsule Commonly known as:  TIAZAC Take 360 mg by mouth daily.   furosemide 40 MG tablet Commonly known as:  LASIX Take 40 mg by mouth daily.   gabapentin 300 MG capsule Commonly known as:  NEURONTIN Take 400 mg 3 (three) times daily by mouth.   guaiFENesin 600 MG 12 hr tablet Commonly known as:  MUCINEX Take 1,200 mg daily by mouth.   LANTUS 100 UNIT/ML injection Generic drug:  insulin glargine Inject 25 Units into the skin 2 (two) times daily.   levalbuterol 0.63 MG/3ML nebulizer solution Commonly known as:  XOPENEX Inhale 3 mLs 4 (four) times daily into the lungs.   LORazepam 0.5 MG tablet Commonly known as:  ATIVAN Take 1 tablet (0.5 mg total) by mouth every 6 (six) hours as needed (Nausea or vomiting).   metoprolol tartrate 50 MG tablet Commonly known as:  LOPRESSOR Take 1 tablet (50 mg total) by mouth 2 (two) times daily.   multivitamin capsule Take by mouth.   oxyCODONE-acetaminophen 5-325 MG tablet Commonly known  as:  PERCOCET/ROXICET Take 1-2 tablets by mouth every 4 (four) hours as needed for severe pain. What changed:  Another medication with the same name was added. Make sure you understand how and when to take each.   oxyCODONE-acetaminophen 5-325 MG tablet Commonly known as:  PERCOCET/ROXICET Take 1 tablet every 6 (six) hours as needed by mouth for severe pain. What changed:  You were already taking a medication with the same name, and this prescription was added. Make sure you understand how and when to take each.   OXYGEN Inhale 2 L as needed into the lungs.   QVAR 80 MCG/ACT inhaler Generic drug:  beclomethasone Inhale 1 puff daily into the lungs.   SYMBICORT 160-4.5 MCG/ACT inhaler Generic drug:  budesonide-formoterol Inhale 2 puffs 2 (two) times daily into the lungs.   tamsulosin 0.4 MG Caps capsule Commonly known as:  FLOMAX Take 0.4 mg 2 (two) times daily after a meal by mouth.   tetrahydrozoline 0.05 % ophthalmic solution Place 1 drop as needed into the left eye.   vancomycin IVPB Inject 1,250 mg every 12 (twelve) hours for 8 days into the vein. Indication:  Bacteremia  Last Day of Therapy:  11/20 Labs - Sunday/Monday:  CBC/D, BMP, and vancomycin trough. Labs - Thursday:  BMP and vancomycin trough Labs - Every other week:  ESR and CRP   XARELTO 15 MG Tabs tablet Generic drug:  Rivaroxaban Take 15 mg daily by mouth.            Home Infusion Instuctions  (From admission, onward)  Start     Ordered   12/03/16 0000  Home infusion instructions [facility] May follow [facility] Dosing Protocol; May administer Cathflo as needed to maintain patency of vascular access device.; Flushing of vascular access device: per [facility] Protocol: 0.9% NaCl pre/post medica...    Question Answer Comment  Instructions May follow [facility] Dosing Protocol   Instructions May administer Cathflo as needed to maintain patency of vascular access device.   Instructions  Flushing of vascular access device: per [facility] Protocol: 0.9% NaCl pre/post medication administration and prn patency; Heparin 100 u/ml, 29m for implanted ports and Heparin 10u/ml, 541mfor all other central venous catheters.   Instructions May follow [facility] Anaphylaxis Protocol for First Dose Administration in the home: 0.9% NaCl at 25-50 ml/hr to maintain IV access for protocol meds. Epinephrine 0.3 ml IV/IM PRN and Benadryl 25-50 IV/IM PRN s/s of anaphylaxis.   Instructions [facility] Infusion Coordinator (RN) to assist per patient IV care needs in the home PRN.      12/03/16 1417      Allergies  Allergen Reactions  . Codeine Other (See Comments) and Itching    Consultations: Cards and id  Procedures/Studies: Dg Chest 2 View  Result Date: 11/30/2016 CLINICAL DATA:  History of lung carcinoma with shortness of breath, initial encounter EXAM: CHEST  2 VIEW COMPARISON:  11/28/2016 FINDINGS: Cardiac shadow is stable. Right-sided chest port is noted in satisfactory position. The Huber needle within the port reservoir appears somewhat superiorly placed on the frontal film but appears appropriately placed on the lateral film. This is likely projectional in nature. The right lung remains clear with the exception of patchy changes in the medial aspect of the right apex. Stable changes in the left base are again seen. No sizable effusion is noted. No acute bony abnormality is seen. IMPRESSION: Stable patchy infiltrative changes in the left base and medial right apex when compared with the prior exam. No new focal abnormality is noted. Electronically Signed   By: MaInez Catalina.D.   On: 11/30/2016 16:03   Ct Chest W Contrast  Result Date: 12/02/2016 CLINICAL DATA:  Small cell lung cancer EXAM: CT CHEST WITH CONTRAST TECHNIQUE: Multidetector CT imaging of the chest was performed during intravenous contrast administration. CONTRAST:  7523mSOVUE-300 IOPAMIDOL (ISOVUE-300) INJECTION 61% COMPARISON:   PET-CT 08/21/2016, 04/11/2016 FINDINGS: Cardiovascular: Coronary artery calcification and aortic atherosclerotic calcification. Mediastinum/Nodes: No axillary supraclavicular adenopathy. Port in the RIGHT chest wall. No mediastinal hilar adenopathy. Esophagus normal. No pericardial effusion. Lungs/Pleura: RIGHT upper lobe mass measures 2.8 x 1.6 cm compared with 3.2 by 1.7 cm on most recent PET-CT scan. This is site of prior large hypermetabolic mass measuring up to 6.5 cm. Bronchiectasis in the RIGHT upper lobe. There is new peribronchial nodules and ground-glass opacity in the RIGHT upper lobe (image 58, series 4) in an infectious or inflammatory pattern. Centrilobular emphysema paraseptal emphysema the upper lobes. Linear atelectasis at the LEFT lung base. Upper Abdomen: Limited view of the liver, kidneys, pancreas are unremarkable. Normal adrenal glands. Musculoskeletal: No aggressive osseous lesion. IMPRESSION: 1. New peribronchial nodularity and ground-glass opacities in the RIGHT upper lobe suggest pulmonary infection or inflammation. Lung cancer recurrence is less favored. 2. Continued contraction of RIGHT upper lobe mass. 3. Paraseptal and centrilobular emphysema. 4. Coronary artery calcification and Aortic Atherosclerosis (ICD10-I70.0). Electronically Signed   By: SteSuzy BouchardD.   On: 12/02/2016 10:19    (Echo, Carotid, EGD, Colonoscopy, ERCP)    Subjective:  Discharge Exam: Vitals:   12/03/16 0759 12/03/16 1141  BP: 108/71 114/79  Pulse: (!) 106 (!) 111  Resp: 18 16  Temp: 98.1 F (36.7 C) 98.1 F (36.7 C)  SpO2: 99% 98%   Vitals:   12/02/16 2023 12/03/16 0542 12/03/16 0759 12/03/16 1141  BP:  (!) 126/55 108/71 114/79  Pulse:  95 (!) 106 (!) 111  Resp:  '18 18 16  ' Temp:  98.7 F (37.1 C) 98.1 F (36.7 C) 98.1 F (36.7 C)  TempSrc:  Oral Oral Oral  SpO2: 96% 97% 99% 98%  Weight:  125.9 kg (277 lb 8 oz)    Height:        General: Pt is alert, awake, not in acute  distress Cardiovascular: RRR, S1/S2 +, no rubs, no gallops Respiratory: CTA bilaterally, no wheezing, no rhonchi Abdominal: Soft, NT, ND, bowel sounds + Extremities: no edema, no cyanosis    The results of significant diagnostics from this hospitalization (including imaging, microbiology, ancillary and laboratory) are listed below for reference.     Microbiology: Recent Results (from the past 240 hour(s))  Culture, sputum-assessment     Status: None   Collection Time: 11/29/16  5:23 AM  Result Value Ref Range Status   Specimen Description SPUTUM  Final   Special Requests NONE  Final   Sputum evaluation THIS SPECIMEN IS ACCEPTABLE FOR SPUTUM CULTURE  Final   Report Status 11/29/2016 FINAL  Final  Culture, respiratory (NON-Expectorated)     Status: None   Collection Time: 11/29/16  5:23 AM  Result Value Ref Range Status   Specimen Description SPUTUM  Final   Special Requests NONE Reflexed from [id number]  Final   Gram Stain   Final    ABUNDANT WBC PRESENT, PREDOMINANTLY PMN ABUNDANT GRAM NEGATIVE RODS MODERATE GRAM POSITIVE RODS MODERATE GRAM POSITIVE COCCI    Culture   Final    ABUNDANT PSEUDOMONAS AERUGINOSA WITHIN MIXED BACTERIAL ORGANISMS    Report Status 12/01/2016 FINAL  Final   Organism ID, Bacteria PSEUDOMONAS AERUGINOSA  Final      Susceptibility   Pseudomonas aeruginosa - MIC*    CEFTAZIDIME 4 SENSITIVE Sensitive     CIPROFLOXACIN <=0.25 SENSITIVE Sensitive     GENTAMICIN <=1 SENSITIVE Sensitive     IMIPENEM 2 SENSITIVE Sensitive     PIP/TAZO 8 SENSITIVE Sensitive     CEFEPIME 4 SENSITIVE Sensitive     * ABUNDANT PSEUDOMONAS AERUGINOSA  Culture, blood (routine x 2)     Status: None (Preliminary result)   Collection Time: 11/29/16  6:15 AM  Result Value Ref Range Status   Specimen Description BLOOD RIGHT ANTECUBITAL  Final   Special Requests   Final    BOTTLES DRAWN AEROBIC AND ANAEROBIC Blood Culture adequate volume   Culture NO GROWTH 3 DAYS  Final   Report  Status PENDING  Incomplete  Culture, blood (routine x 2)     Status: None (Preliminary result)   Collection Time: 11/29/16  6:30 AM  Result Value Ref Range Status   Specimen Description BLOOD RIGHT FOREARM  Final   Special Requests   Final    BOTTLES DRAWN AEROBIC AND ANAEROBIC Blood Culture adequate volume   Culture NO GROWTH 3 DAYS  Final   Report Status PENDING  Incomplete  Culture, blood (routine x 2)     Status: None (Preliminary result)   Collection Time: 11/30/16 11:00 AM  Result Value Ref Range Status   Specimen Description BLOOD RIGHT HAND  Final  Special Requests   Final    BOTTLES DRAWN AEROBIC ONLY Blood Culture adequate volume   Culture NO GROWTH 2 DAYS  Final   Report Status PENDING  Incomplete  Culture, blood (routine x 2)     Status: None (Preliminary result)   Collection Time: 11/30/16 11:07 AM  Result Value Ref Range Status   Specimen Description BLOOD LEFT HAND  Final   Special Requests   Final    BOTTLES DRAWN AEROBIC ONLY Blood Culture adequate volume   Culture NO GROWTH 2 DAYS  Final   Report Status PENDING  Incomplete     Labs: BNP (last 3 results) Recent Labs    04/18/16 0845  BNP 938.1*   Basic Metabolic Panel: Recent Labs  Lab 11/28/16 2349 11/29/16 0613 12/02/16 0455 12/03/16 0406  NA 136 137 136 137  K 4.1 3.8 3.8 4.2  CL 102 103 102 103  CO2 '27 28 29 27  ' GLUCOSE 197* 153* 186* 185*  BUN '15 13 15 13  ' CREATININE 1.04 0.97 1.05 0.97  CALCIUM 8.9 8.9 8.5* 8.8*  MG  --  1.6* 1.8  --    Liver Function Tests: Recent Labs  Lab 11/28/16 2349 12/02/16 0455  AST 22 20  ALT 23 24  ALKPHOS 47 42  BILITOT 0.6 0.6  PROT 5.2* 4.8*  ALBUMIN 2.8* 2.9*   No results for input(s): LIPASE, AMYLASE in the last 168 hours. No results for input(s): AMMONIA in the last 168 hours. CBC: Recent Labs  Lab 11/28/16 2349 11/29/16 0613 12/02/16 0455  WBC 6.6 5.0 5.5  NEUTROABS  --   --  4.2  HGB 10.4* 10.7* 9.9*  HCT 32.8* 33.8* 31.2*  MCV 102.5*  103.0* 103.3*  PLT 141* 145* 129*   Cardiac Enzymes: Recent Labs  Lab 11/28/16 2349  TROPONINI <0.03   BNP: Invalid input(s): POCBNP CBG: Recent Labs  Lab 12/02/16 2015 12/02/16 2340 12/03/16 0420 12/03/16 0806 12/03/16 1201  GLUCAP 197* 117* 187* 116* 258*   D-Dimer No results for input(s): DDIMER in the last 72 hours. Hgb A1c No results for input(s): HGBA1C in the last 72 hours. Lipid Profile No results for input(s): CHOL, HDL, LDLCALC, TRIG, CHOLHDL, LDLDIRECT in the last 72 hours. Thyroid function studies No results for input(s): TSH, T4TOTAL, T3FREE, THYROIDAB in the last 72 hours.  Invalid input(s): FREET3 Anemia work up No results for input(s): VITAMINB12, FOLATE, FERRITIN, TIBC, IRON, RETICCTPCT in the last 72 hours. Urinalysis    Component Value Date/Time   COLORURINE YELLOW 01/29/2014 1808   APPEARANCEUR CLEAR 01/29/2014 1808   LABSPEC 1.020 01/29/2014 1808   PHURINE 6.5 01/29/2014 1808   GLUCOSEU 250 (A) 01/29/2014 1808   HGBUR NEGATIVE 01/29/2014 1808   BILIRUBINUR NEGATIVE 01/29/2014 1808   KETONESUR TRACE (A) 01/29/2014 1808   PROTEINUR NEGATIVE 01/29/2014 1808   UROBILINOGEN 0.2 01/29/2014 1808   NITRITE NEGATIVE 01/29/2014 1808   LEUKOCYTESUR NEGATIVE 01/29/2014 1808   Sepsis Labs Invalid input(s): PROCALCITONIN,  WBC,  LACTICIDVEN Microbiology Recent Results (from the past 240 hour(s))  Culture, sputum-assessment     Status: None   Collection Time: 11/29/16  5:23 AM  Result Value Ref Range Status   Specimen Description SPUTUM  Final   Special Requests NONE  Final   Sputum evaluation THIS SPECIMEN IS ACCEPTABLE FOR SPUTUM CULTURE  Final   Report Status 11/29/2016 FINAL  Final  Culture, respiratory (NON-Expectorated)     Status: None   Collection Time: 11/29/16  5:23 AM  Result Value Ref Range Status   Specimen Description SPUTUM  Final   Special Requests NONE Reflexed from [id number]  Final   Gram Stain   Final    ABUNDANT WBC PRESENT,  PREDOMINANTLY PMN ABUNDANT GRAM NEGATIVE RODS MODERATE GRAM POSITIVE RODS MODERATE GRAM POSITIVE COCCI    Culture   Final    ABUNDANT PSEUDOMONAS AERUGINOSA WITHIN MIXED BACTERIAL ORGANISMS    Report Status 12/01/2016 FINAL  Final   Organism ID, Bacteria PSEUDOMONAS AERUGINOSA  Final      Susceptibility   Pseudomonas aeruginosa - MIC*    CEFTAZIDIME 4 SENSITIVE Sensitive     CIPROFLOXACIN <=0.25 SENSITIVE Sensitive     GENTAMICIN <=1 SENSITIVE Sensitive     IMIPENEM 2 SENSITIVE Sensitive     PIP/TAZO 8 SENSITIVE Sensitive     CEFEPIME 4 SENSITIVE Sensitive     * ABUNDANT PSEUDOMONAS AERUGINOSA  Culture, blood (routine x 2)     Status: None (Preliminary result)   Collection Time: 11/29/16  6:15 AM  Result Value Ref Range Status   Specimen Description BLOOD RIGHT ANTECUBITAL  Final   Special Requests   Final    BOTTLES DRAWN AEROBIC AND ANAEROBIC Blood Culture adequate volume   Culture NO GROWTH 3 DAYS  Final   Report Status PENDING  Incomplete  Culture, blood (routine x 2)     Status: None (Preliminary result)   Collection Time: 11/29/16  6:30 AM  Result Value Ref Range Status   Specimen Description BLOOD RIGHT FOREARM  Final   Special Requests   Final    BOTTLES DRAWN AEROBIC AND ANAEROBIC Blood Culture adequate volume   Culture NO GROWTH 3 DAYS  Final   Report Status PENDING  Incomplete  Culture, blood (routine x 2)     Status: None (Preliminary result)   Collection Time: 11/30/16 11:00 AM  Result Value Ref Range Status   Specimen Description BLOOD RIGHT HAND  Final   Special Requests   Final    BOTTLES DRAWN AEROBIC ONLY Blood Culture adequate volume   Culture NO GROWTH 2 DAYS  Final   Report Status PENDING  Incomplete  Culture, blood (routine x 2)     Status: None (Preliminary result)   Collection Time: 11/30/16 11:07 AM  Result Value Ref Range Status   Specimen Description BLOOD LEFT HAND  Final   Special Requests   Final    BOTTLES DRAWN AEROBIC ONLY Blood  Culture adequate volume   Culture NO GROWTH 2 DAYS  Final   Report Status PENDING  Incomplete     Time coordinating discharge: Over 30 minutes  SIGNED:   Georgette Shell, MD  [facility] 12/03/2016, 2:48 PM Pager   If 7PM-7AM, please contact night-coverage [email or web address] Password [email or web address]

## 2016-12-03 NOTE — Progress Notes (Addendum)
[facility] for Infectious Disease  I agree with note and plan as outlined. He is improved and can stop cefepime at discharge. He will continue with vancomycin to complete 2 weeks as long as repeat cultures 11/9 remain negative.  OK to follow cultures as an outpatient.  Port a cath planned for removal per the patient.   He will follow up with me about 11/26 after that and will do surveillance cultures.    Scharlene Gloss, MD     Date of Admission:  11/28/2016     Total days of antibiotics 6  Vancomycin 6 Cefepime 6         ASSESSMENT/PLAN 51 year ol male with history of DM, Small cell lnung cancer, cardiomyopathy, COPD and atrial fibrillation/flutter with Pseudomonas HCAP and Coag-negative staph bacteremia currently treated with cefepime and vancomycin. Blood cultures on 11/8 with no growth to date and remains afebrile with no leukocytosis/leukopenia. Has Port-a-cath that does not appear to be infected at present. Scheduled to have Port-a-cath removed and pace maker placed.   Pseudomonas HCAP - No additional fevers or leukocytosis. Appears with continued improvement. Will discontinue cefepime pending discharge. Complete 1 more day of cefepime if he remains inpatient through tomorrow.  Coag negative Staph Bacteremia - No fevers or leukocytosis. Blood cultures from 11/9 remain with no growth. No indication for TEE at this time. Agree with treatment of potential methacillin-resistant coag negative staph with continued vancomycin for 8 more days with end date of 11/20.  Has Port-A-Cath in place for infusion.   Diagnosis: Methacillin Resistant Coag negative staph bacteremia  Culture Result: Pseudomonas aer.  Allergies  Allergen Reactions  . Codeine Other (See Comments) and Itching    OPAT Orders Discharge antibiotics: Vancomycin Per pharmacy protocol: Vancomycin Aim for Vancomycin trough 15-20 (unless otherwise indicated) Duration: Cefepime 1 day and Vancomycin 9 days for  bacteremia.   End Date: 11/20 for vancomycin.   PIC Care Per Protocol:  Labs weekly while on IV antibiotics: _X_ CBC with differential _X_ BMP __ CMP __ CRP __ ESR _X_ Vancomycin trough  __ Please pull PIC at completion of IV antibiotics __ Please leave PIC in place until doctor has seen patient or been notified  Fax weekly labs to [phone]  Clinic Follow Up Appt: 3-4 days following completion of therapy.    Principal Problem:   Atrial fibrillation (HCC) Active Problems:   Essential hypertension, benign   COPD (chronic obstructive pulmonary disease) (HCC)   Small cell lung cancer ([place])   Diabetes mellitus type 2 in obese ([place])   Bacteremia due to Gram-positive bacteria   Healthcare-associated pneumonia   . budesonide (PULMICORT) nebulizer solution  0.5 mg Nebulization BID  . diltiazem  240 mg Oral BID  . gabapentin  300 mg Oral TID  . guaiFENesin  600 mg Oral BID  . insulin aspart  0-15 Units Subcutaneous Q4H  . insulin glargine  25 Units Subcutaneous BID  . levalbuterol  0.63 mg Nebulization QID  . metoprolol tartrate  50 mg Oral Q2200   And  . metoprolol tartrate  75 mg Oral q morning - 10a  . rivaroxaban  20 mg Oral Q supper  . sodium chloride flush  10-40 mL Intracatheter Q12H  . tamsulosin  0.4 mg Oral QPC supper    SUBJECTIVE: Afebrile.   Allergies  Allergen Reactions  . Codeine Other (See Comments) and Itching     Review of Systems: Review of Systems  Constitutional: Negative for chills, fever  and weight loss.  Respiratory: Positive for cough and sputum production. Negative for shortness of breath and wheezing.   Cardiovascular: Negative for chest pain.  Gastrointestinal: Negative for abdominal pain, constipation, diarrhea, nausea and vomiting.  Neurological: Negative for weakness.      OBJECTIVE: Vitals:   12/02/16 2018 12/02/16 2023 12/03/16 0542 12/03/16 0759  BP: 115/69  (!) 126/55 108/71  Pulse: (!) 108  95 (!) 106  Resp: '18   18 18  ' Temp: 99.2 F (37.3 C)  98.7 F (37.1 C) 98.1 F (36.7 C)  TempSrc: Oral  Oral Oral  SpO2: 97% 96% 97% 99%  Weight:   277 lb 8 oz (125.9 kg)   Height:       Body mass index is 37.64 kg/m.  Physical Exam  Constitutional: He is well-developed, well-nourished, and in no distress.  Sitting on the side of the bed.  Cardiovascular: Normal rate. An irregularly irregular rhythm present.  Pulmonary/Chest: No accessory muscle usage. No tachypnea. No respiratory distress.  Coarse lung sounds with some wheezing. Wheezing greater on the left.     Lab Results Lab Results  Component Value Date   WBC 5.5 12/02/2016   HGB 9.9 (L) 12/02/2016   HCT 31.2 (L) 12/02/2016   MCV 103.3 (H) 12/02/2016   PLT 129 (L) 12/02/2016    Lab Results  Component Value Date   CREATININE 0.97 12/03/2016   BUN 13 12/03/2016   NA 137 12/03/2016   K 4.2 12/03/2016   CL 103 12/03/2016   CO2 27 12/03/2016    Lab Results  Component Value Date   ALT 24 12/02/2016   AST 20 12/02/2016   ALKPHOS 42 12/02/2016   BILITOT 0.6 12/02/2016     Microbiology: Recent Results (from the past 240 hour(s))  Culture, sputum-assessment     Status: None   Collection Time: 11/29/16  5:23 AM  Result Value Ref Range Status   Specimen Description SPUTUM  Final   Special Requests NONE  Final   Sputum evaluation THIS SPECIMEN IS ACCEPTABLE FOR SPUTUM CULTURE  Final   Report Status 11/29/2016 FINAL  Final  Culture, respiratory (NON-Expectorated)     Status: None   Collection Time: 11/29/16  5:23 AM  Result Value Ref Range Status   Specimen Description SPUTUM  Final   Special Requests NONE Reflexed from [id number]  Final   Gram Stain   Final    ABUNDANT WBC PRESENT, PREDOMINANTLY PMN ABUNDANT GRAM NEGATIVE RODS MODERATE GRAM POSITIVE RODS MODERATE GRAM POSITIVE COCCI    Culture   Final    ABUNDANT PSEUDOMONAS AERUGINOSA WITHIN MIXED BACTERIAL ORGANISMS    Report Status 12/01/2016 FINAL  Final   Organism ID,  Bacteria PSEUDOMONAS AERUGINOSA  Final      Susceptibility   Pseudomonas aeruginosa - MIC*    CEFTAZIDIME 4 SENSITIVE Sensitive     CIPROFLOXACIN <=0.25 SENSITIVE Sensitive     GENTAMICIN <=1 SENSITIVE Sensitive     IMIPENEM 2 SENSITIVE Sensitive     PIP/TAZO 8 SENSITIVE Sensitive     CEFEPIME 4 SENSITIVE Sensitive     * ABUNDANT PSEUDOMONAS AERUGINOSA  Culture, blood (routine x 2)     Status: None (Preliminary result)   Collection Time: 11/29/16  6:15 AM  Result Value Ref Range Status   Specimen Description BLOOD RIGHT ANTECUBITAL  Final   Special Requests   Final    BOTTLES DRAWN AEROBIC AND ANAEROBIC Blood Culture adequate volume   Culture NO GROWTH 3 DAYS  Final  Report Status PENDING  Incomplete  Culture, blood (routine x 2)     Status: None (Preliminary result)   Collection Time: 11/29/16  6:30 AM  Result Value Ref Range Status   Specimen Description BLOOD RIGHT FOREARM  Final   Special Requests   Final    BOTTLES DRAWN AEROBIC AND ANAEROBIC Blood Culture adequate volume   Culture NO GROWTH 3 DAYS  Final   Report Status PENDING  Incomplete  Culture, blood (routine x 2)     Status: None (Preliminary result)   Collection Time: 11/30/16 11:00 AM  Result Value Ref Range Status   Specimen Description BLOOD RIGHT HAND  Final   Special Requests   Final    BOTTLES DRAWN AEROBIC ONLY Blood Culture adequate volume   Culture NO GROWTH 2 DAYS  Final   Report Status PENDING  Incomplete  Culture, blood (routine x 2)     Status: None (Preliminary result)   Collection Time: 11/30/16 11:07 AM  Result Value Ref Range Status   Specimen Description BLOOD LEFT HAND  Final   Special Requests   Final    BOTTLES DRAWN AEROBIC ONLY Blood Culture adequate volume   Culture NO GROWTH 2 DAYS  Final   Report Status PENDING  Incomplete     Terri Piedra, NP [facility] [phone] Pager  12/03/2016  10:51 AM

## 2016-12-03 NOTE — Progress Notes (Signed)
Reviewed all discharge instructions with patient.  HHRN, Pam with [facility] in and educated patient on how to administer home IV Vancomycin. Patient performed return demonstration with practice equipment for Pam.  He stated understanding of how to self-administer his IV medication.  Arrangements made for delivery of Home care equipment.  No voiced complaints by patient.  He stated he all personal belongings, including home 02, that he brought to hospital on admission.

## 2016-12-03 NOTE — Progress Notes (Addendum)
Progress Note  Patient Name: Chase Herrera Date of Encounter: 12/03/2016  Primary Cardiologist: Dr. [name]   Patient Profile     66 y.o. male with a hx of AFlutter ablated 2013, developed AFib?, severe O2 dependent COPD, hx of RUL small cell cancer ? Brain mets  (s/p Rad Tx/chemo, patient states completed therapy), he reports unable to have surgery 2/2 severe COPD, quit smoking 3 years ago, DM, HTN, admitted with AFlutter,RVR  + blood cultures,at [facility] 2/2 g + cocci  pneumonia? Seen by [facility] felt not candidate  for AAD so rate control would be strategy.  He failed to maintain sinus rhythm on dofetilide initiated at [facility] ID has weighed in on his + BC;drawn through [facility] a Cath.  Two weeks of therapy but pacing can be done whenever  On ASA and Rivaroxaban   Subjective  Flees better  Less sob No chest pain   Inpatient Medications    Scheduled Meds: . budesonide (PULMICORT) nebulizer solution  0.5 mg Nebulization BID  . diltiazem  240 mg Oral BID  . gabapentin  300 mg Oral TID  . guaiFENesin  600 mg Oral BID  . insulin aspart  0-15 Units Subcutaneous Q4H  . insulin glargine  25 Units Subcutaneous BID  . levalbuterol  0.63 mg Nebulization QID  . metoprolol tartrate  50 mg Oral BID  . rivaroxaban  20 mg Oral Q supper  . sodium chloride flush  10-40 mL Intracatheter Q12H  . tamsulosin  0.4 mg Oral QPC supper   Continuous Infusions: . ceFEPime (MAXIPIME) IV 1 g (12/03/16 0634)  . vancomycin 1,250 mg (12/03/16 0718)   PRN Meds: acetaminophen, levalbuterol, magnesium hydroxide, ondansetron (ZOFRAN) IV, oxyCODONE-acetaminophen, sodium chloride flush   Vital Signs    Vitals:   12/02/16 2018 12/02/16 2023 12/03/16 0542 12/03/16 0759  BP: 115/69  (!) 126/55 108/71  Pulse: (!) 108  95 (!) 106  Resp: 18  18   Temp: 99.2 F (37.3 C)  98.7 F (37.1 C) 98.1 F (36.7 C)  TempSrc: Oral  Oral Oral  SpO2: 97% 96% 97% 99%  Weight:   277 lb 8 oz (125.9 kg)   Height:         Intake/Output Summary (Last 24 hours) at 12/03/2016 0803 Last data filed at 12/03/2016 0600 Gross per 24 hour  Intake 2040 ml  Output 1950 ml  Net 90 ml   Filed Weights   12/01/16 0514 12/02/16 0613 12/03/16 0542  Weight: 274 lb 4.8 oz (124.4 kg) 277 lb 9.6 oz (125.9 kg) 277 lb 8 oz (125.9 kg)    Telemetry    Aflutter with rates 60-120  ECG     Physical Exam   Well developed and nourished in no acute distress HENT normal Neck supple with JVP-flat Coarse ronchi Irregular rate and rhythm, no murmurs or gallops Abd-soft with active BS No Clubbing cyanosis 1+edema Skin-warm and dry A & Oriented  Grossly normal sensory and motor function     Labs    Chemistry Recent Labs  Lab 11/28/16 2349 11/29/16 0613 12/02/16 0455 12/03/16 0406  NA 136 137 136 137  K 4.1 3.8 3.8 4.2  CL 102 103 102 103  CO2 27 28 29 27   GLUCOSE 197* 153* 186* 185*  BUN 15 13 15 13   CREATININE 1.04 0.97 1.05 0.97  CALCIUM 8.9 8.9 8.5* 8.8*  PROT 5.2*  --  4.8*  --   ALBUMIN 2.8*  --  2.9*  --  AST 22  --  20  --   ALT 23  --  24  --   ALKPHOS 47  --  42  --   BILITOT 0.6  --  0.6  --   GFRNONAA >60 >60 >60 >60  GFRAA >60 >60 >60 >60  ANIONGAP 7 6 5 7      Hematology Recent Labs  Lab 11/28/16 2349 11/29/16 0613 12/02/16 0455  WBC 6.6 5.0 5.5  RBC 3.20* 3.28* 3.02*  HGB 10.4* 10.7* 9.9*  HCT 32.8* 33.8* 31.2*  MCV 102.5* 103.0* 103.3*  MCH 32.5 32.6 32.8  MCHC 31.7 31.7 31.7  RDW 14.6 14.9 15.0  PLT 141* 145* 129*    Cardiac Enzymes Recent Labs  Lab 11/28/16 2349  TROPONINI <0.03   No results for input(s): TROPIPOC in the last 168 hours.   BNPNo results for input(s): BNP, PROBNP in the last 168 hours.   DDimer No results for input(s): DDIMER in the last 168 hours.   Radiology    Ct Chest W Contrast  Result Date: 12/02/2016 CLINICAL DATA:  Small cell lung cancer EXAM: CT CHEST WITH CONTRAST TECHNIQUE: Multidetector CT imaging of the chest was performed  during intravenous contrast administration. CONTRAST:  61mL ISOVUE-300 IOPAMIDOL (ISOVUE-300) INJECTION 61% COMPARISON:  PET-CT 08/21/2016, 04/11/2016 FINDINGS: Cardiovascular: Coronary artery calcification and aortic atherosclerotic calcification. Mediastinum/Nodes: No axillary supraclavicular adenopathy. Port in the RIGHT chest wall. No mediastinal hilar adenopathy. Esophagus normal. No pericardial effusion. Lungs/Pleura: RIGHT upper lobe mass measures 2.8 x 1.6 cm compared with 3.2 by 1.7 cm on most recent PET-CT scan. This is site of prior large hypermetabolic mass measuring up to 6.5 cm. Bronchiectasis in the RIGHT upper lobe. There is new peribronchial nodules and ground-glass opacity in the RIGHT upper lobe (image 58, series 4) in an infectious or inflammatory pattern. Centrilobular emphysema paraseptal emphysema the upper lobes. Linear atelectasis at the LEFT lung base. Upper Abdomen: Limited view of the liver, kidneys, pancreas are unremarkable. Normal adrenal glands. Musculoskeletal: No aggressive osseous lesion. IMPRESSION: 1. New peribronchial nodularity and ground-glass opacities in the RIGHT upper lobe suggest pulmonary infection or inflammation. Lung cancer recurrence is less favored. 2. Continued contraction of RIGHT upper lobe mass. 3. Paraseptal and centrilobular emphysema. 4. Coronary artery calcification and Aortic Atherosclerosis (ICD10-I70.0). Electronically Signed   By: Suzy Bouchard M.D.   On: 12/02/2016 10:19    Cardiac Studies   11/15/16: TTE Study Conclusions - Left ventricle: The cavity size was normal. Wall thickness was normal. Systolic function was normal. The estimated ejection fraction was in the range of 60% to 65%. Left ventricular diastolic function parameters were normal. - Aortic valve: Valve area (VTI): 3.65 cm^2. Valve area (Vmax): 3.42 cm^2. Valve area (Vmean): 3.4 cm^2.     Assessment & Plan    1.  AFlutter, atypical      Hx of flutter  ablation, record indicates subsequently has had AFib      CHA2DS2Vasc is at least 2, on Xarelto appropriately dosed here at 20mg     2. + blood cultures at [facility] 2/2 with gram + cocci    3. Severe COPD     Hx of lung Ca treated, ??brain METS 4. ? pneumonia 5. Lung Cancer s/p Rad and chemo 6. HTN 7.  Edema  Acute    Will give dose of IV lasix for edema  ABx per primary service Will increase the metoprolol in the am just a bit Will plan EP outpt followup in about 2 weeks  by which time his abx will be done, and a decision can be made regarding his Porta Cath-- will call oncology service   BP well controlled   OK from our point of view to go home

## 2016-12-03 NOTE — Care Management Note (Signed)
Case Management Note  Patient Details  Name: Chase Herrera MRN: [id number] Date of Birth: 03-28-1950  Subjective/Objective:     Atrial Fib              Action/Plan: Patient lives at home alone; PCP: Neale Burly, MD; has private insurance with [facility] with prescription drug coverage; has home oxygen through [place]; patient is for home IV antibiotics; [place] choice offered, pt chose [facility]; Pam with [facility] made aware and will see the patient today; CM will continue to follow for DCP.      Expected Discharge Date:  Possibly 12/03/2016              Expected Discharge Plan:  [place]  In-House Referral:   [facility]  Discharge planning Services  CM Consult  Choice offered to:  Patient  HH Arranged:  RN [facility] Agency:  [place]  Status of Service:  In process, will continue to follow  Sherrilyn Rist [phone] 12/03/2016, 11:17 AM

## 2016-12-03 NOTE — Progress Notes (Signed)
[place] pt for [facility] this hospital admission.  [facility] will provide [facility] services for home IV ABX at DC to home.  [facility] can also provide [facility] services based on pt choice.  If patient discharges after hours, please call [phone].   Larry Sierras 12/03/2016, 10:33 AM

## 2016-12-04 DIAGNOSIS — Z7951 Long term (current) use of inhaled steroids: Secondary | ICD-10-CM | POA: Diagnosis not present

## 2016-12-04 DIAGNOSIS — J189 Pneumonia, unspecified organism: Secondary | ICD-10-CM | POA: Diagnosis not present

## 2016-12-04 DIAGNOSIS — J440 Chronic obstructive pulmonary disease with acute lower respiratory infection: Secondary | ICD-10-CM | POA: Diagnosis not present

## 2016-12-04 DIAGNOSIS — C3490 Malignant neoplasm of unspecified part of unspecified bronchus or lung: Secondary | ICD-10-CM | POA: Diagnosis not present

## 2016-12-04 DIAGNOSIS — R7881 Bacteremia: Secondary | ICD-10-CM | POA: Diagnosis not present

## 2016-12-04 DIAGNOSIS — J168 Pneumonia due to other specified infectious organisms: Secondary | ICD-10-CM | POA: Diagnosis not present

## 2016-12-04 DIAGNOSIS — J441 Chronic obstructive pulmonary disease with (acute) exacerbation: Secondary | ICD-10-CM | POA: Diagnosis not present

## 2016-12-04 DIAGNOSIS — B965 Pseudomonas (aeruginosa) (mallei) (pseudomallei) as the cause of diseases classified elsewhere: Secondary | ICD-10-CM | POA: Diagnosis not present

## 2016-12-04 LAB — CULTURE, BLOOD (ROUTINE X 2)
Culture: NO GROWTH
Culture: NO GROWTH
Special Requests: ADEQUATE
Special Requests: ADEQUATE

## 2016-12-05 ENCOUNTER — Telehealth: Payer: Self-pay

## 2016-12-05 LAB — CULTURE, BLOOD (ROUTINE X 2)
Culture: NO GROWTH
Culture: NO GROWTH
Special Requests: ADEQUATE
Special Requests: ADEQUATE

## 2016-12-05 NOTE — Telephone Encounter (Signed)
12-04-16 Amy with [facility] left message on voice mail inquiring if Dr Megan Salon would sign orders for services needed in the home.    12-05-16  I left a message requesting more information regarding the orders.   Laverle Patter, RN

## 2016-12-06 ENCOUNTER — Other Ambulatory Visit: Payer: Self-pay

## 2016-12-06 DIAGNOSIS — Z5181 Encounter for therapeutic drug level monitoring: Secondary | ICD-10-CM | POA: Diagnosis not present

## 2016-12-06 DIAGNOSIS — Z7951 Long term (current) use of inhaled steroids: Secondary | ICD-10-CM | POA: Diagnosis not present

## 2016-12-06 DIAGNOSIS — J440 Chronic obstructive pulmonary disease with acute lower respiratory infection: Secondary | ICD-10-CM | POA: Diagnosis not present

## 2016-12-06 NOTE — Patient Outreach (Signed)
[facility]) Care Management  12/06/2016  JILBERTO VANDERWALL [birth year] [id number]  EMMI: Pneumonia Referral Date: 12/06/16 Referral Source: EMMI Referral Reason: Scheduled Follow up appointment- I don't know Day # 1   Outreach attempt # 1 Spoke with patient he is able to verify HIPAA.  Patient verifies that he has home health for IV Antibiotics and para medicine for weekly visit. Patient reports he is taking medications as ordered. Reviewed and addressed red alert. Patient states he did not have an appointment on yesterday when call came in but has since had an appointment made for tomorrow with PCP office. Discussed with patient importance of seeking help if condition worsens.  He verbalized understanding.    Advised patient that they would continue to get automated EMMI-PNA post discharge calls to assess how they are doing following recent hospitalization and will receive a call from a nurse if any of their responses were abnormal. Patient voiced understanding and was appreciative of f/u call.  Plan: RN CM will close case at this time and notify care management assistant of case status.   Jone Baseman, RN, MSN [facility] Care Management Care Management Coordinator Direct Line [phone] Toll Free: [phone]  Fax: [phone]

## 2016-12-06 NOTE — Progress Notes (Signed)
Cardiology Office Note  Date: 12/07/2016   ID: Chase Herrera, Chase Herrera 1950/03/24, MRN [id number]  PCP: Neale Burly, MD  Evaluating cardiologist: [name], MD   Chief Complaint  Patient presents with  . Atrial Flutter    History of Present Illness: Chase Herrera is a 66 y.o. male that I have not seen in the office since 2013.  I reviewed extensive records, he was just seen by Dr. Johnsie Cancel in October - I reviewed the note.  He has a history of atrial flutter status post ablation by Dr. [name] in 2013, more recently recurrent atrial flutter with RVR.  Dr. Johnsie Cancel started him back on Xarelto as well as Lopressor and had a follow-up echocardiogram obtained.  Since that time the patient was hospitalized at [facility] in transfer from [facility].  He was treated for pneumonia and bacteremia (blood cultures drawn from [facility]).  He was noted to be in atypical atrial flutter despite treatment with Tikosyn, was seen by Dr. [name] for EP consultation at which point Tikosyn was discontinued with plan for heart rate control and anticoagulation (amiodarone felt to be poor option with his severe COPD).  Patient was subsequently seen by Dr. Caryl Comes and there was discussion about the possibility of AV node ablation and pacemaker insertion potentially.  He presents today for a follow-up visit.  He is in a wheelchair, functionally limited at baseline, on continuous oxygen.  He has a pulse oximeter and states that he checks his oxygen level and heart rate regularly at home.  When he is still, particularly at nighttime he indicates that his heart rate is in the 80s, otherwise during the daytime it is over 100.  He reports compliance with Lopressor and Xarelto, no bleeding problems.  I reviewed his recent lab work and echocardiogram.  Past Medical History:  Diagnosis Date  . Alcohol abuse    Quit 08/23/11  . Arthritis   . Atrial fibrillation ([place])   . Atrial flutter ([place])    CTI  ablation by Dr [name] 10/2011  . COPD (chronic obstructive pulmonary disease) ([place])   . Depression   . Essential hypertension   . History of cardiomyopathy    LVEF 25-30% 08/2011 with subsequent normalization  . Obesity   . Prostate enlargement   . Small cell lung cancer ([place])    Right upper lobe - follows with [facility]  . Type 2 diabetes mellitus ([place])     Past Surgical History:  Procedure Laterality Date  . APPENDECTOMY    . ATRIAL ABLATION SURGERY  11/06/2011   CTI ablation for atrial flutter by Dr [name]  . ATRIAL FLUTTER ABLATION N/A 11/06/2011   Performed by Thompson Grayer, MD at [facility] CATH LAB  . Cataracts    . IR GENERIC HISTORICAL  04/09/2016   IR US GUIDE VASC ACCESS RIGHT 04/09/2016 [name], DO [facility]-INTERV RAD  . IR GENERIC HISTORICAL  04/09/2016   IR FLUORO GUIDE PORT INSERTION RIGHT 04/09/2016 [name], DO [facility]-INTERV RAD  . TONSILLECTOMY      Current Outpatient Medications  Medication Sig Dispense Refill  . acetaminophen (TYLENOL) 325 MG tablet Take every 6 (six) hours as needed by mouth.    Marland Kitchen albuterol (PROVENTIL HFA;VENTOLIN HFA) 108 (90 BASE) MCG/ACT inhaler Inhale 2 puffs into the lungs every 6 (six) hours as needed. For shortness of breath    . aspirin EC 81 MG tablet Take 81 mg by mouth daily.    Marland Kitchen  beclomethasone (QVAR) 80 MCG/ACT inhaler Inhale 1 puff daily into the lungs.    Marland Kitchen diltiazem (TIAZAC) 360 MG 24 hr capsule Take 360 mg by mouth daily.  0  . furosemide (LASIX) 40 MG tablet Take 40 mg by mouth daily.    Marland Kitchen gabapentin (NEURONTIN) 300 MG capsule Take 400 mg 3 (three) times daily by mouth.     Marland Kitchen guaiFENesin (MUCINEX) 600 MG 12 hr tablet Take 1,200 mg daily by mouth.    . insulin glargine (LANTUS) 100 UNIT/ML injection Inject 25 Units into the skin 2 (two) times daily.    Marland Kitchen levalbuterol (XOPENEX) 0.63 MG/3ML nebulizer solution Inhale 3 mLs 4 (four) times daily into the lungs.  0  . LORazepam (ATIVAN) 0.5 MG tablet Take 1 tablet (0.5 mg total) by  mouth every 6 (six) hours as needed (Nausea or vomiting). 30 tablet 0  . metoprolol tartrate (LOPRESSOR) 50 MG tablet Take 1 tablet (50 mg total) by mouth 2 (two) times daily. 180 tablet 3  . Multiple Vitamin (MULTIVITAMIN) capsule Take by mouth.    . oxyCODONE-acetaminophen (PERCOCET/ROXICET) 5-325 MG tablet Take 1 tablet every 6 (six) hours as needed by mouth for severe pain. 30 tablet 0  . OXYGEN Inhale 2 L as needed into the lungs.    . Rivaroxaban (XARELTO) 15 MG TABS tablet Take 15 mg daily by mouth.    . SYMBICORT 160-4.5 MCG/ACT inhaler Inhale 2 puffs 2 (two) times daily into the lungs.  1  . tamsulosin (FLOMAX) 0.4 MG CAPS capsule Take 0.4 mg 2 (two) times daily after a meal by mouth.     . tetrahydrozoline 0.05 % ophthalmic solution Place 1 drop as needed into the left eye.    . vancomycin IVPB Inject 1,250 mg every 12 (twelve) hours for 8 days into the vein. Indication:  Bacteremia  Last Day of Therapy:  11/20 Labs - Sunday/Monday:  CBC/D, BMP, and vancomycin trough. Labs - Thursday:  BMP and vancomycin trough Labs - Every other week:  ESR and CRP 17 Units 0   No current facility-administered medications for this visit.    Allergies:  Codeine   Social History: The patient  reports that he has quit smoking. His smoking use included cigarettes. He has a 23.50 pack-year smoking history. He quit smokeless tobacco use about 2 years ago. He reports that he drinks alcohol. He reports that he does not use drugs.   ROS:  Please see the history of present illness. Otherwise, complete review of systems is positive for chronic shortness of breath and fatigue.  All other systems are reviewed and negative.   Physical Exam: VS:  BP 122/70   Pulse (!) 111   Ht _0  (1.854 m)   Wt 277 lb (125.6 kg)   SpO2 91%   BMI 36.55 kg/m , BMI Body mass index is 36.55 kg/m.  Wt Readings from Last 3 Encounters:  12/07/16 277 lb (125.6 kg)  12/03/16 277 lb 8 oz (125.9 kg)  11/02/16 280 lb (127 kg)      General: Chronically ill-appearing obese male seated in wheelchair.  HEENT: Conjunctiva and lids normal, oropharynx clear with poor dentition. Neck: Supple, no elevated JVP, no thyromegaly. Lungs: Decreased breath sounds with prolonged expiratory phase but no wheezing, nonlabored breathing at rest. Cardiac: Distant regular heart sounds, no S3, no significant murmur. Abdomen: Soft, nontender, bowel sounds present. Extremities: Chronic appearing leg edema, distal pulses 1-2+. Skin: Warm and dry. Musculoskeletal: No kyphosis. Neuropsychiatric: Alert and oriented  x3, affect grossly appropriate.  ECG: I personally reviewed the tracing from 11/29/2016 which showed typical atrial flutter with variable conduction and poor R wave progression.  Recent Labwork: 04/18/2016: B Natriuretic Peptide 163.0 12/02/2016: ALT 24; AST 20; Hemoglobin 9.9; Magnesium 1.8; Platelets 129 12/03/2016: BUN 13; Creatinine, Ser 0.97; Potassium 4.2; Sodium 137   Other Studies Reviewed Today:  Echocardiogram 11/15/2016: Study Conclusions  - Left ventricle: The cavity size was normal. Wall thickness was   normal. Systolic function was normal. The estimated ejection   fraction was in the range of 60% to 65%. Left ventricular   diastolic function parameters were normal. - Aortic valve: Valve area (VTI): 3.65 cm^2. Valve area (Vmax):   3.42 cm^2. Valve area (Vmean): 3.4 cm^2.  Chest CT 12/01/2016: IMPRESSION: 1. New peribronchial nodularity and ground-glass opacities in the RIGHT upper lobe suggest pulmonary infection or inflammation. Lung cancer recurrence is less favored. 2. Continued contraction of RIGHT upper lobe mass. 3. Paraseptal and centrilobular emphysema. 4. Coronary artery calcification and Aortic Atherosclerosis (ICD10-I70.0).  Brain MRI 04/09/2016: IMPRESSION: Negative for metastatic disease to the brain.  No acute abnormality.  Assessment and Plan:  1.  Atypical atrial flutter, persistent.   Patient was recently evaluated by Dr. [name] and Dr. Caryl Comes while at [facility] and there was discussion about pursuing AV node ablation with pacemaker placement given concerns about use of antiarrhythmic therapy including both Tikosyn and amiodarone.  Patient is currently on Lopressor and Xarelto.  We are scheduling an office visit with Dr. [name] in the next available spot in [facility] to finalize plan on this patient's rhythm management.  2.  History of nonischemic cardia myopathy with normalization of LVEF.  Recent echocardiogram revealed LVEF 60-65%.  3.  Severe COPD, chronic hypoxic respite failure requiring oxygen supplementation.  4.  Small cell lung cancer, continues to follow with Oncology.  Recent CT imaging showed decrease in size of right upper lobe mass.  Current medicines were reviewed with the patient today.  Disposition: Follow-up after visit with Dr. [name].  Signed, Satira Sark, MD, [facility] 12/07/2016 2:24 PM    [facility] [place], [name], [place] [address] Phone: [phone]; Fax: [phone]

## 2016-12-07 ENCOUNTER — Encounter: Payer: Self-pay | Admitting: Cardiology

## 2016-12-07 ENCOUNTER — Ambulatory Visit (INDEPENDENT_AMBULATORY_CARE_PROVIDER_SITE_OTHER): Payer: Medicare Other | Admitting: Cardiology

## 2016-12-07 VITALS — BP 122/70 | HR 111 | Ht 73.0 in | Wt 277.0 lb

## 2016-12-07 DIAGNOSIS — I5042 Chronic combined systolic (congestive) and diastolic (congestive) heart failure: Secondary | ICD-10-CM | POA: Diagnosis not present

## 2016-12-07 DIAGNOSIS — E119 Type 2 diabetes mellitus without complications: Secondary | ICD-10-CM | POA: Diagnosis not present

## 2016-12-07 DIAGNOSIS — Z7951 Long term (current) use of inhaled steroids: Secondary | ICD-10-CM | POA: Diagnosis not present

## 2016-12-07 DIAGNOSIS — Z8679 Personal history of other diseases of the circulatory system: Secondary | ICD-10-CM | POA: Diagnosis not present

## 2016-12-07 DIAGNOSIS — Z794 Long term (current) use of insulin: Secondary | ICD-10-CM | POA: Diagnosis not present

## 2016-12-07 DIAGNOSIS — I484 Atypical atrial flutter: Secondary | ICD-10-CM | POA: Diagnosis not present

## 2016-12-07 DIAGNOSIS — Z85118 Personal history of other malignant neoplasm of bronchus and lung: Secondary | ICD-10-CM

## 2016-12-07 DIAGNOSIS — I509 Heart failure, unspecified: Secondary | ICD-10-CM | POA: Diagnosis not present

## 2016-12-07 DIAGNOSIS — Z7901 Long term (current) use of anticoagulants: Secondary | ICD-10-CM | POA: Diagnosis not present

## 2016-12-07 DIAGNOSIS — I201 Angina pectoris with documented spasm: Secondary | ICD-10-CM | POA: Diagnosis not present

## 2016-12-07 DIAGNOSIS — Z7982 Long term (current) use of aspirin: Secondary | ICD-10-CM | POA: Diagnosis not present

## 2016-12-07 DIAGNOSIS — J449 Chronic obstructive pulmonary disease, unspecified: Secondary | ICD-10-CM

## 2016-12-07 DIAGNOSIS — I110 Hypertensive heart disease with heart failure: Secondary | ICD-10-CM | POA: Diagnosis not present

## 2016-12-07 DIAGNOSIS — E0843 Diabetes mellitus due to underlying condition with diabetic autonomic (poly)neuropathy: Secondary | ICD-10-CM | POA: Diagnosis not present

## 2016-12-07 DIAGNOSIS — Z79899 Other long term (current) drug therapy: Secondary | ICD-10-CM | POA: Diagnosis not present

## 2016-12-07 DIAGNOSIS — J441 Chronic obstructive pulmonary disease with (acute) exacerbation: Secondary | ICD-10-CM | POA: Diagnosis not present

## 2016-12-07 DIAGNOSIS — I10 Essential (primary) hypertension: Secondary | ICD-10-CM | POA: Diagnosis not present

## 2016-12-07 DIAGNOSIS — Z87891 Personal history of nicotine dependence: Secondary | ICD-10-CM | POA: Diagnosis not present

## 2016-12-07 DIAGNOSIS — Z452 Encounter for adjustment and management of vascular access device: Secondary | ICD-10-CM | POA: Diagnosis not present

## 2016-12-07 NOTE — Patient Instructions (Signed)
Your physician recommends that you schedule a follow-up appointment in: January WITH DR Weatherby  Your physician recommends that you continue on your current medications as directed. Please refer to the Current Medication list given to you today.  WE WILL SCHEDULE YOU WITH DR [name]  Thank you for choosing [facility]!!

## 2016-12-09 DIAGNOSIS — Z7951 Long term (current) use of inhaled steroids: Secondary | ICD-10-CM | POA: Diagnosis not present

## 2016-12-09 DIAGNOSIS — J189 Pneumonia, unspecified organism: Secondary | ICD-10-CM | POA: Diagnosis not present

## 2016-12-09 DIAGNOSIS — E1169 Type 2 diabetes mellitus with other specified complication: Secondary | ICD-10-CM | POA: Diagnosis not present

## 2016-12-09 DIAGNOSIS — R7881 Bacteremia: Secondary | ICD-10-CM | POA: Diagnosis not present

## 2016-12-09 DIAGNOSIS — I4891 Unspecified atrial fibrillation: Secondary | ICD-10-CM | POA: Diagnosis not present

## 2016-12-09 DIAGNOSIS — J440 Chronic obstructive pulmonary disease with acute lower respiratory infection: Secondary | ICD-10-CM | POA: Diagnosis not present

## 2016-12-10 ENCOUNTER — Other Ambulatory Visit: Payer: Self-pay

## 2016-12-10 NOTE — Patient Outreach (Signed)
[facility]) Care Management  12/10/2016  Chase Herrera 20-Jun-1950 [id number]   EMMI Red Alert:    [name] Sat 12/08/16 Fri 12/07/16  [phone] Day # 4 Day # 3  PATIENT PNEUMONIA PATIENT FOLLOW-UP AND SLEEPING PNEUMONIA PATIENT FOLLOW-UP AND MOVING  Time of Last Call Attempt 10:12 AM 4:09 PM  Who reached Patient Patient  Scheduled a follow-up appointment?      Been to follow-up appointment?   No  Feeling better overall? No No  More short of breath than yesterday? Yes Yes  Wheezing more than yesterday? No Yes  Been confused? Yes No  Swelling in hands/feet or changes in weight? Yes Yes  Wake up with shortness of breath when lying flat? Yes    Emmi Program: PNEUMONIA       Spoke with patient. He is able to verify HIPAA.  Addressed with patient EMMI red alerts. Patient reports he feels he is doing better with his pneumonia and he had some heart issues which he feels is getting better too. Patient denies more shortness of breath or wheezing.  He states he has enough people coming to see about him and that he is annoyed with the calls and states he hangs up. Patient asked for calls to be stopped as he would rather talk to someone.    Advised patient that I would make that request. Patient reports he saw his primary care physician and he does not think it is going to workout and he has an appointment to another doctor and that he thinks he may have some problems with getting into the office due to the layout. Asked patient if he utilized transportation through [facility].  He state he did not know about that gave patient information about [employer]are to call to inquire.    Plan: RN CM will asked for call to stop for patient and close case.   Jone Baseman, RN, MSN [facility] Care Management Care Management Coordinator Direct Line [phone] Toll Free: [phone]  Fax: [phone]

## 2016-12-11 ENCOUNTER — Other Ambulatory Visit: Payer: Self-pay

## 2016-12-11 ENCOUNTER — Ambulatory Visit (INDEPENDENT_AMBULATORY_CARE_PROVIDER_SITE_OTHER): Payer: Medicare Other | Admitting: Family Medicine

## 2016-12-11 ENCOUNTER — Encounter: Payer: Self-pay | Admitting: Family Medicine

## 2016-12-11 VITALS — BP 110/60 | HR 82 | Temp 98.4°F | Resp 16 | Ht 73.0 in | Wt 277.0 lb

## 2016-12-11 DIAGNOSIS — J439 Emphysema, unspecified: Secondary | ICD-10-CM

## 2016-12-11 DIAGNOSIS — E1169 Type 2 diabetes mellitus with other specified complication: Secondary | ICD-10-CM | POA: Diagnosis not present

## 2016-12-11 DIAGNOSIS — I10 Essential (primary) hypertension: Secondary | ICD-10-CM

## 2016-12-11 DIAGNOSIS — Z79899 Other long term (current) drug therapy: Secondary | ICD-10-CM | POA: Diagnosis not present

## 2016-12-11 DIAGNOSIS — E669 Obesity, unspecified: Secondary | ICD-10-CM | POA: Diagnosis not present

## 2016-12-11 NOTE — Progress Notes (Signed)
Patient ID: Chase Herrera, male    DOB: 02-28-1950, 66 y.o.   MRN: [id number]  Chief Complaint  Patient presents with  . Establish Care  . Hypertension    Allergies Codeine  Subjective:   Chase Herrera is a 66 y.o. male who presents to [facility] today.  HPI Chase Herrera presents today as a new patient visit to establish care.  He is accompanied by Rhae Lerner, a community paramedic.  He reports that he has transferred his care to our office because he is not been happy with his previous physician.  He did not have any of his previous records for review.  He did not bring in his medications with him to the visit today.  He is also followed by oncology, radiation oncology, and cardiology.  He has a history of small cell lung cancer and COPD.  He reports he has never had any breathing studies or pulmonary function testing.  He does not know if he has a diagnosis of sleep apnea.  He does use home oxygen during the day at times and every night.  He is also on inhaled corticosteroid and long-acting bronchodilator for his breathing.  He also has an albuterol rescue inhaler which he uses from time to time.  He reports that his breathing is doing well.  Reports that he always gets short of breath with talking and sounds winded.  He denies any chest pain.  Has a history of atrial fibrillation status post ablation and is followed by cardiology.  Reports he is supposed to have a pacemaker placed after he finishes up vancomycin for bacterial infection in his port.  He denies having any known kidney issues.  He used to smoke but quit 3 years ago.  Has a prior history of alcohol abuse and currently denies any alcohol or drug use.  Reports that he has had a little more swelling in his ankles than normal because he is having to use so much fluid with the vancomycin.  He reports that his breathing is stable.  Has 1 more dose of vancomycin left and he will have completed his therapy.  Reports he  will follow-up with the infectious disease doctor who is been managing his vancomycin.  Reports that his mood is good.  He has friends and family that are close to him.  He attends church.  Does not have any troubles getting food.  He has been trying to work on his diet.  He is taking his insulin twice a day as directed.  He reports when he was discharged from the hospital he was told not to take the metformin and not to take his short acting insulin.  He reports that he has been doing this anyway and that his blood sugars have been running well.  His blood sugars have been running less than 150.  He is not sure what his last hemoglobin A1c value was or when it was checked.  He denies any lesions on his feet.  He has received his flu and pneumonia shots.  Reports that he was hospitalized last year for pneumonia.  Is taking his Xarelto for his atrial fibrillation.  He has also been taking his diltiazem and metoprolol for his heart rate.  Reports that he has never been on an ACE inhibitor for renal protection related to his diabetes but is not sure what his last kidney function was.  Has never been on statin therapy to his knowledge.  Hypertension  This is a chronic problem. The current episode started more than 1 year ago. The problem has been waxing and waning since onset. The problem is controlled. Pertinent negatives include no anxiety, blurred vision, chest pain, headaches, malaise/fatigue, palpitations, peripheral edema, PND or sweats. There are no associated agents to hypertension. Risk factors for coronary artery disease include dyslipidemia, family history, obesity, male gender, sedentary lifestyle, smoking/tobacco exposure and stress. Past treatments include calcium channel blockers and beta blockers. The current treatment provides moderate improvement. Compliance problems include exercise.     Past Medical History:  Diagnosis Date  . Alcohol abuse    Quit 08/23/11  . Arthritis   . Atrial  fibrillation ([place])   . Atrial flutter ([place])    CTI ablation by Dr [name] 10/2011  . COPD (chronic obstructive pulmonary disease) ([place])   . Depression   . Essential hypertension   . History of cardiomyopathy    LVEF 25-30% 08/2011 with subsequent normalization  . Obesity   . Prostate enlargement   . Small cell lung cancer ([place])    Right upper lobe - follows with [facility]  . Type 2 diabetes mellitus ([place])     Past Surgical History:  Procedure Laterality Date  . APPENDECTOMY    . ATRIAL ABLATION SURGERY  11/06/2011   CTI ablation for atrial flutter by Dr [name]  . ATRIAL FLUTTER ABLATION N/A 11/06/2011   Procedure: ATRIAL FLUTTER ABLATION;  Surgeon: Thompson Grayer, MD;  Location: [facility] CATH LAB;  Service: Cardiovascular;  Laterality: N/A;  . Cataracts    . IR GENERIC HISTORICAL  04/09/2016   IR US GUIDE VASC ACCESS RIGHT 04/09/2016 [name], DO [facility]-INTER[facility] RAD  . IR GENERIC HISTORICAL  04/09/2016   IR FLUORO GUIDE PORT INSERTION RIGHT 04/09/2016 [name], DO [facility]  . TONSILLECTOMY      Family History  Problem Relation Age of Onset  . Diabetes Sister   . Hypertension Mother   . Lung cancer Mother   . Hypertension Father   . Heart attack Father   . Hypertension Sister   . Hypertension Sister   . Hypertension Sister   . Pulmonary fibrosis Sister      Social History   Socioeconomic History  . Marital status: Single    Spouse name: None  . Number of children: None  . Years of education: None  . Highest education level: None  Social Needs  . Financial resource strain: Somewhat hard  . Food insecurity - worry: Patient refused  . Food insecurity - inability: Patient refused  . Transportation needs - medical: Patient refused  . Transportation needs - non-medical: Patient refused  Occupational History  . Occupation: retired     Comment: Building services engineer  Tobacco Use  . Smoking status: Former Smoker    Packs/day: 0.50    Years: 47.00    Pack years: 23.50     Types: Cigarettes  . Smokeless tobacco: Former Systems developer    Quit date: 04/10/2014  . Tobacco comment: encouraged to quit today 03/04/12  Substance and Sexual Activity  . Alcohol use: No    Frequency: Never    Comment: quit 2 years ago  . Drug use: No    Comment: marajuana occasionally  . Sexual activity: Not Currently  Other Topics Concern  . None  Social History Narrative   Lives in [place] alone.   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 acetaminophen (TYLENOL) 325 MG tablet Take every 6 (six) hours as needed by mouth.    Marland Kitchen aspirin EC 81 MG tablet Take 81 mg by mouth daily.    . beclomethasone (QVAR) 80 MCG/ACT inhaler Inhale 1 puff daily into the lungs.    Marland Kitchen diltiazem (TIAZAC) 360 MG 24 hr capsule Take 360 mg by mouth daily.  0  . furosemide (LASIX) 40 MG tablet Take 40 mg by mouth daily.    Marland Kitchen gabapentin (NEURONTIN) 300 MG capsule Take 400 mg 3 (three) times daily by mouth.     Marland Kitchen guaiFENesin (MUCINEX) 600 MG 12 hr tablet Take 1,200 mg daily by mouth.    . insulin glargine (LANTUS) 100 UNIT/ML injection Inject 25 Units into the skin 2 (two) times daily.    Marland Kitchen levalbuterol (XOPENEX) 0.63 MG/3ML nebulizer solution Inhale 3 mLs 4 (four) times daily into the lungs.  0  . metoprolol tartrate (LOPRESSOR) 50 MG tablet Take 1 tablet (50 mg total) by mouth 2 (two) times daily. 180 tablet 3  . Multiple Vitamin (MULTIVITAMIN) capsule Take by mouth.    . OXYGEN Inhale 2 L as needed into the lungs.    . Rivaroxaban (XARELTO) 15 MG TABS tablet Take 15 mg daily by mouth.    . SYMBICORT 160-4.5 MCG/ACT inhaler Inhale 2 puffs 2 (two) times daily into the lungs.  1  . tamsulosin (FLOMAX) 0.4 MG CAPS capsule Take 0.4 mg 2 (two) times daily after a meal by mouth.     . vancomycin IVPB Inject 1,250 mg every 12 (twelve) hours for 8 days into the vein. Indication:  Bacteremia  Last Day of  Therapy:  11/20 Labs - Sunday/Monday:  CBC/D, BMP, and vancomycin trough. Labs - Thursday:  BMP and vancomycin trough Labs - Every other week:  ESR and CRP 17 Units 0  . albuterol (PROVENTIL HFA;VENTOLIN HFA) 108 (90 BASE) MCG/ACT inhaler Inhale 2 puffs into the lungs every 6 (six) hours as needed. For shortness of breath    . LORazepam (ATIVAN) 0.5 MG tablet Take 1 tablet (0.5 mg total) by mouth every 6 (six) hours as needed (Nausea or vomiting). (Patient not taking: Reported on 12/11/2016) 30 tablet 0  . oxyCODONE-acetaminophen (PERCOCET/ROXICET) 5-325 MG tablet Take 1 tablet every 6 (six) hours as needed by mouth for severe pain. (Patient not taking: Reported on 12/11/2016) 30 tablet 0  . tetrahydrozoline 0.05 % ophthalmic solution Place 1 drop as needed into the left eye.     No current facility-administered medications on file prior to visit.     Review of Systems  Constitutional: Negative for appetite change, diaphoresis, fatigue, fever, malaise/fatigue and unexpected weight change.  HENT: Negative for congestion, facial swelling, sore throat, trouble swallowing and voice change.   Eyes: Negative for blurred vision and visual disturbance.  Respiratory: Negative for cough and chest tightness.        Reports that he does get short of breath at times but feels okay the past several weeks.  He reports that his voice always sounds like it does and he always sounds like he is out of breath when he talks although he feels fine.  Reports if he does not feel good he just uses his oxygen at home.  Cardiovascular: Negative for chest pain, palpitations and PND.  Gastrointestinal: Negative for abdominal pain, constipation, diarrhea, nausea and vomiting.  Endocrine: Negative for polydipsia, polyphagia and polyuria.  Genitourinary: Negative  for dysuria, frequency, hematuria and urgency.  Musculoskeletal: Negative for myalgias.  Skin: Negative for rash.  Neurological: Negative for headaches.    Psychiatric/Behavioral: Negative for confusion, dysphoric mood and sleep disturbance. The patient is not nervous/anxious and is not hyperactive.      Objective:   BP 110/60 (BP Location: Left Arm, Patient Position: Sitting, Cuff Size: Normal)   Pulse 82   Temp 98.4 F (36.9 C) (Other (Comment))   Resp 16   Ht 6' 1" (1.854 m)   Wt 277 lb (125.6 kg)   SpO2 91%   BMI 36.55 kg/m   Physical Exam  Constitutional: He is oriented to person, place, and time. He appears well-developed and well-nourished.  Morbidly obese man, appearance older than stated age, very poor dentition.  Appears to be winded with conversation.  HENT:  Head: Normocephalic and atraumatic.  Eyes: EOM are normal. Pupils are equal, round, and reactive to light. No scleral icterus.  Neck: Normal range of motion. Neck supple. No JVD present.  Cardiovascular: Normal rate, regular rhythm and normal heart sounds.  Pulmonary/Chest: Effort normal.  Breath sounds diminished with distant heart sounds.  No appreciable wheezes.  Abdominal: Soft. Bowel sounds are normal. He exhibits no distension. There is no tenderness.  Musculoskeletal: Normal range of motion.  Neurological: He is alert and oriented to person, place, and time. No cranial nerve deficit.  Skin: Skin is warm and dry.  2+ edema at the ankles bilaterally.  No calf tenderness to palpation.  No erythema in lower extremities bilaterally.  No lesions on feet.  Vitals reviewed.    Assessment and Plan  1. Pulmonary emphysema, unspecified emphysema type ([place]) Patient with long history of tobacco use, COPD and lung cancer status post radiation therapy.  Will refer for pulmonology evaluation at this time.  Patient currently on inhaled corticosteroid and long-acting beta agonist.  In addition he uses rescue inhaler.  I am not sure what his baseline lung function is and how much improvement we would expect to get with these medications.  He will continue his home oxygen.   Patient has never had sleep study or evaluation for sleep disordered breathing which is a possibility due to his body habitus.  Will refer to Dr. Luan Pulling for evaluation.  We will also review records in chart. - Ambulatory referral to Pulmonology  2. Diabetes mellitus type 2 in obese ([facility])  - Hemoglobin A1c ordered.  Will request immunization records to determine vaccination status. Foot exam at follow-up and will discuss ophthalmology at that time. Will request records.  I am not sure why patient is not on ACE inhibitor for renal protection.  However I am not sure what his baseline kidney function is.  We will also review records and determine what his last cholesterol panel was.  Patient is concerned about checking labs and getting billed again.  He reports that he does have financial stressors.  Diet, exercise and weight loss discussed.  Foot care discussed. 3. Essential hypertension, benign Blood pressure stable today. - Basic metabolic panel  4. High risk medication use  - Basic metabolic panel Patient asked to bring in a dietary record to his next visit.  In addition he was asked to bring in all of his medications so that I could review them.  He was not exactly sure of which inhalers or medications he was taking today.  He does report that he is taking the metformin and using insulin but is not exactly sure which insulin he  is using other than the Lantus because it is not on his hospital discharge records.  He was encouraged to keep his scheduled visits with cardiology, oncology, and infectious disease.  Patient told to monitor his blood sugars.  In addition he was told if his swelling worsened or did not improve that he needed to follow-up sooner.  He was encouraged to watch his salt intake. Return for Diabetes. [name], MD 12/11/2016

## 2016-12-12 DIAGNOSIS — J440 Chronic obstructive pulmonary disease with acute lower respiratory infection: Secondary | ICD-10-CM | POA: Diagnosis not present

## 2016-12-12 DIAGNOSIS — Z7951 Long term (current) use of inhaled steroids: Secondary | ICD-10-CM | POA: Diagnosis not present

## 2016-12-12 LAB — BASIC METABOLIC PANEL
BUN: 12 mg/dL (ref 7–25)
CO2: 30 mmol/L (ref 20–32)
Calcium: 9.1 mg/dL (ref 8.6–10.3)
Chloride: 103 mmol/L (ref 98–110)
Creat: 1.1 mg/dL (ref 0.70–1.25)
Glucose, Bld: 146 mg/dL — ABNORMAL HIGH (ref 65–139)
Potassium: 3.9 mmol/L (ref 3.5–5.3)
Sodium: 143 mmol/L (ref 135–146)

## 2016-12-12 LAB — HEMOGLOBIN A1C
Hgb A1c MFr Bld: 6.8 % of total Hgb — ABNORMAL HIGH (ref <5.7)
Mean Plasma Glucose: 148 (calc)
eAG (mmol/L): 8.2 (calc)

## 2016-12-14 DIAGNOSIS — J449 Chronic obstructive pulmonary disease, unspecified: Secondary | ICD-10-CM | POA: Diagnosis not present

## 2016-12-14 DIAGNOSIS — J439 Emphysema, unspecified: Secondary | ICD-10-CM | POA: Diagnosis not present

## 2016-12-17 ENCOUNTER — Telehealth (HOSPITAL_COMMUNITY): Payer: Self-pay | Admitting: *Deleted

## 2016-12-17 ENCOUNTER — Encounter (HOSPITAL_COMMUNITY): Payer: Self-pay | Admitting: *Deleted

## 2016-12-17 ENCOUNTER — Inpatient Hospital Stay: Payer: Medicare Other | Admitting: Internal Medicine

## 2016-12-17 ENCOUNTER — Telehealth: Payer: Self-pay | Admitting: *Deleted

## 2016-12-17 DIAGNOSIS — Z7982 Long term (current) use of aspirin: Secondary | ICD-10-CM | POA: Diagnosis not present

## 2016-12-17 DIAGNOSIS — Z85118 Personal history of other malignant neoplasm of bronchus and lung: Secondary | ICD-10-CM | POA: Diagnosis not present

## 2016-12-17 DIAGNOSIS — Z794 Long term (current) use of insulin: Secondary | ICD-10-CM | POA: Diagnosis not present

## 2016-12-17 DIAGNOSIS — Z7901 Long term (current) use of anticoagulants: Secondary | ICD-10-CM | POA: Diagnosis not present

## 2016-12-17 DIAGNOSIS — I4891 Unspecified atrial fibrillation: Secondary | ICD-10-CM | POA: Diagnosis not present

## 2016-12-17 DIAGNOSIS — Z7951 Long term (current) use of inhaled steroids: Secondary | ICD-10-CM | POA: Diagnosis not present

## 2016-12-17 DIAGNOSIS — J449 Chronic obstructive pulmonary disease, unspecified: Secondary | ICD-10-CM | POA: Diagnosis not present

## 2016-12-17 DIAGNOSIS — Z886 Allergy status to analgesic agent status: Secondary | ICD-10-CM | POA: Diagnosis not present

## 2016-12-17 DIAGNOSIS — Z8249 Family history of ischemic heart disease and other diseases of the circulatory system: Secondary | ICD-10-CM | POA: Diagnosis not present

## 2016-12-17 DIAGNOSIS — E119 Type 2 diabetes mellitus without complications: Secondary | ICD-10-CM | POA: Diagnosis not present

## 2016-12-17 DIAGNOSIS — I110 Hypertensive heart disease with heart failure: Secondary | ICD-10-CM | POA: Diagnosis not present

## 2016-12-17 DIAGNOSIS — Z87891 Personal history of nicotine dependence: Secondary | ICD-10-CM | POA: Diagnosis not present

## 2016-12-17 DIAGNOSIS — I5040 Unspecified combined systolic (congestive) and diastolic (congestive) heart failure: Secondary | ICD-10-CM | POA: Diagnosis not present

## 2016-12-17 DIAGNOSIS — R0602 Shortness of breath: Secondary | ICD-10-CM | POA: Diagnosis not present

## 2016-12-17 DIAGNOSIS — N400 Enlarged prostate without lower urinary tract symptoms: Secondary | ICD-10-CM | POA: Diagnosis not present

## 2016-12-17 DIAGNOSIS — I10 Essential (primary) hypertension: Secondary | ICD-10-CM | POA: Diagnosis not present

## 2016-12-17 DIAGNOSIS — I482 Chronic atrial fibrillation: Secondary | ICD-10-CM | POA: Diagnosis not present

## 2016-12-17 DIAGNOSIS — Z7984 Long term (current) use of oral hypoglycemic drugs: Secondary | ICD-10-CM | POA: Diagnosis not present

## 2016-12-17 DIAGNOSIS — K7460 Unspecified cirrhosis of liver: Secondary | ICD-10-CM | POA: Diagnosis not present

## 2016-12-17 DIAGNOSIS — Z9221 Personal history of antineoplastic chemotherapy: Secondary | ICD-10-CM | POA: Diagnosis not present

## 2016-12-17 DIAGNOSIS — Z79899 Other long term (current) drug therapy: Secondary | ICD-10-CM | POA: Diagnosis not present

## 2016-12-17 DIAGNOSIS — Z923 Personal history of irradiation: Secondary | ICD-10-CM | POA: Diagnosis not present

## 2016-12-17 DIAGNOSIS — R0682 Tachypnea, not elsewhere classified: Secondary | ICD-10-CM | POA: Diagnosis not present

## 2016-12-17 DIAGNOSIS — R7989 Other specified abnormal findings of blood chemistry: Secondary | ICD-10-CM | POA: Diagnosis not present

## 2016-12-17 DIAGNOSIS — R079 Chest pain, unspecified: Secondary | ICD-10-CM | POA: Diagnosis not present

## 2016-12-17 DIAGNOSIS — Z801 Family history of malignant neoplasm of trachea, bronchus and lung: Secondary | ICD-10-CM | POA: Diagnosis not present

## 2016-12-17 NOTE — Telephone Encounter (Signed)
Patient called left message requesting for Dr Mannie Stabile to fill his oxycodone because his cancer doctor is not filling it for him anymore, patient states he takes it for his chest pain and he is [facility] for his chest pains. Please advise

## 2016-12-17 NOTE — Progress Notes (Unsigned)
An inbasket and an official letter was sent to Dr. Mannie Stabile regarding [facility] not prescribing future narcotics for patient.

## 2016-12-17 NOTE — Telephone Encounter (Signed)
Patient called in stating that he wanted his oxycodone refilled. Dr. Talbert Cage stated that patient needed to go to PCP to have future pain meds filled. Pt said okay. I am providing written documentation to his PCP [name] stating that we are no longer filling his narcotics.

## 2016-12-18 ENCOUNTER — Inpatient Hospital Stay: Payer: Medicare Other | Admitting: Internal Medicine

## 2016-12-18 DIAGNOSIS — I10 Essential (primary) hypertension: Secondary | ICD-10-CM | POA: Diagnosis not present

## 2016-12-18 DIAGNOSIS — I482 Chronic atrial fibrillation: Secondary | ICD-10-CM | POA: Diagnosis not present

## 2016-12-18 NOTE — Telephone Encounter (Signed)
I will not be prescribing his oxycodone. The cancer center sent a note that they would not be filling it either.

## 2016-12-19 DIAGNOSIS — E119 Type 2 diabetes mellitus without complications: Secondary | ICD-10-CM | POA: Diagnosis not present

## 2016-12-19 DIAGNOSIS — I482 Chronic atrial fibrillation: Secondary | ICD-10-CM | POA: Diagnosis not present

## 2016-12-19 DIAGNOSIS — I10 Essential (primary) hypertension: Secondary | ICD-10-CM | POA: Diagnosis not present

## 2016-12-19 DIAGNOSIS — N401 Enlarged prostate with lower urinary tract symptoms: Secondary | ICD-10-CM | POA: Diagnosis not present

## 2016-12-19 NOTE — Telephone Encounter (Signed)
Called patient regarding message below. No answer, unable to leave message.  

## 2016-12-20 DIAGNOSIS — I4891 Unspecified atrial fibrillation: Secondary | ICD-10-CM | POA: Diagnosis not present

## 2016-12-20 DIAGNOSIS — I10 Essential (primary) hypertension: Secondary | ICD-10-CM | POA: Diagnosis not present

## 2016-12-20 NOTE — Telephone Encounter (Signed)
Called patient regarding message below. No answer, unable to leave message.  

## 2016-12-23 DIAGNOSIS — J449 Chronic obstructive pulmonary disease, unspecified: Secondary | ICD-10-CM | POA: Diagnosis not present

## 2016-12-23 DIAGNOSIS — R0602 Shortness of breath: Secondary | ICD-10-CM | POA: Diagnosis not present

## 2016-12-23 DIAGNOSIS — E119 Type 2 diabetes mellitus without complications: Secondary | ICD-10-CM | POA: Diagnosis not present

## 2016-12-23 DIAGNOSIS — I509 Heart failure, unspecified: Secondary | ICD-10-CM | POA: Diagnosis not present

## 2016-12-23 DIAGNOSIS — Z87891 Personal history of nicotine dependence: Secondary | ICD-10-CM | POA: Diagnosis not present

## 2016-12-23 DIAGNOSIS — Z7982 Long term (current) use of aspirin: Secondary | ICD-10-CM | POA: Diagnosis not present

## 2016-12-23 DIAGNOSIS — Z7951 Long term (current) use of inhaled steroids: Secondary | ICD-10-CM | POA: Diagnosis not present

## 2016-12-23 DIAGNOSIS — R0600 Dyspnea, unspecified: Secondary | ICD-10-CM | POA: Diagnosis not present

## 2016-12-23 DIAGNOSIS — Z79899 Other long term (current) drug therapy: Secondary | ICD-10-CM | POA: Diagnosis not present

## 2016-12-23 DIAGNOSIS — Z85118 Personal history of other malignant neoplasm of bronchus and lung: Secondary | ICD-10-CM | POA: Diagnosis not present

## 2016-12-23 DIAGNOSIS — I2510 Atherosclerotic heart disease of native coronary artery without angina pectoris: Secondary | ICD-10-CM | POA: Diagnosis not present

## 2016-12-23 DIAGNOSIS — I480 Paroxysmal atrial fibrillation: Secondary | ICD-10-CM | POA: Diagnosis not present

## 2016-12-23 DIAGNOSIS — I110 Hypertensive heart disease with heart failure: Secondary | ICD-10-CM | POA: Diagnosis not present

## 2016-12-25 ENCOUNTER — Encounter (HOSPITAL_COMMUNITY): Payer: Self-pay | Admitting: Oncology

## 2016-12-25 ENCOUNTER — Other Ambulatory Visit: Payer: Self-pay

## 2016-12-25 ENCOUNTER — Encounter (HOSPITAL_COMMUNITY): Payer: Medicare Other | Attending: Oncology | Admitting: Oncology

## 2016-12-25 VITALS — BP 108/69 | HR 88 | Temp 98.2°F | Resp 20 | Wt 270.1 lb

## 2016-12-25 DIAGNOSIS — C3411 Malignant neoplasm of upper lobe, right bronchus or lung: Secondary | ICD-10-CM

## 2016-12-25 DIAGNOSIS — C3490 Malignant neoplasm of unspecified part of unspecified bronchus or lung: Secondary | ICD-10-CM

## 2016-12-25 NOTE — Progress Notes (Signed)
 [facility]  Follow Up Progress Note  Patient Care Team: Hagler, Rachel, MD as PCP - General (Family Medicine)  CHIEF COMPLAINTS/PURPOSE OF CONSULTATION:  Small cell carcinoma of RUL  HISTORY OF PRESENTING ILLNESS:     Small cell lung cancer (HCC)   04/03/2016 Initial Diagnosis    Small cell lung cancer (HCC)      04/09/2016 Imaging    MRI brain- Negative for metastatic disease to the brain. No acute abnormality.      04/09/2016 Procedure    Right IJ port catheter placement by IR      04/13/2016 PET scan    . The right apical lung mass has significantly enlarged and there is new right paratracheal adenopathy as well as a hypermetabolic new lymph node in the right inner infraclavicular fossa potentially with early impingement on adjacent neurovascular structures. 2. Other imaging findings of potential clinical significance: Chronic bilateral maxillary sinusitis. Left carotid atherosclerotic calcification. Aortoiliac atherosclerotic vascular disease. Coronary atherosclerosis. Paraseptal emphysema. Mild atelectasis in the lung bases. Scattered sigmoid colon diverticula. Probable right pars defect at L5.      05/09/2016 - 07/12/2016 Radiation Therapy    Radiotherapy to right Lung+MS 63 Gy/35 fractions at 1.8 GY/f using 6 x photons 3- D CRT technique Dates: 4/18-6/21/18          06/19/2016 Treatment Plan Change    Cisplatin dose reduced by 20% for cycle #4 due to severe thrombocytopenia following cycle #3 (13,000).      08/21/2016 PET scan    1. Marked reduction in size and metabolic activity of RIGHT upper lobe mass. 2. Resolution of mediastinal and RIGHT supraclavicular lymphadenopathy. 3. No evidence of disease progression.       Chase Herrera 66 y.o. male previous smoker with a history of severe COPD on 2L O2, PAF (on Xarelto), and DM who is here because of a RUL mass. The patient was evaluated by Dr. Wudel in CT surgery, who noticed a RUL mass.  A CT of the chest was done and showed a 4 cm hypermetabolic (SUV 16.8) RUL mass that was invading rib #1 and rib #2 posterolaterally on PET. On 03/22/16 patient underwent a bronchoscopy with right VATS and biopsy of RUL mass. Surgical path from biopsy showed high grade neuroendocrine carcinoma, most consistent with small cell carcinoma.  Treatment consisted of 4 cycles of chemo with cisplatin +etoposise 04/16/16- 06/21/16 along with concurrent RT which he completed on 07/12/16.  Patient presents today for continue follow-up.  He was hospitalized at [facility] from 11/28/16 through 12/03/16 for chest pain and shortness of breath.  Chest x-ray showed a right upper lobe pneumonia and at that time he was also having symptoms of tachycardia and he was given 10 mg of diltiazem.  He had troponins done which were negative.  Patient also has sepsis with gram-positive cocci.  He states that yesterday he went back to [facility] for tachycardia and was placed on digoxin.  He has an appointment coming up with cardiology for discussion of potential pacemaker.  Currently he denies any chest pain, short, abdominal pain, nausea, vomiting, diarrhea.  He continues to get easily short of breath with exertion.  He is performing his own ADLs.  Patient is CT chest with contrast performed on 12/01/2016 which showed continued contraction of the right upper lobe mass which previously measured 2.2 x 1.7 cm and now is 2.8 x 1.6 cm.  He also had new.  Bronchial nodularity and groundglass opacity   in the right upper lobe suggesting pulmonary infection/inflammation.  MEDICAL HISTORY:  Past Medical History:  Diagnosis Date  . Alcohol abuse    Quit 08/23/11  . Arthritis   . Atrial fibrillation ([place])   . Atrial flutter ([place])    CTI ablation by Dr [name] 10/2011  . COPD (chronic obstructive pulmonary disease) ([place])   . Depression   . Essential hypertension   . History of cardiomyopathy    LVEF 25-30% 08/2011 with subsequent normalization   . Obesity   . Prostate enlargement   . Small cell lung cancer ([place])    Right upper lobe - follows with [facility]  . Type 2 diabetes mellitus ([place])     SURGICAL HISTORY: Past Surgical History:  Procedure Laterality Date  . APPENDECTOMY    . ATRIAL ABLATION SURGERY  11/06/2011   CTI ablation for atrial flutter by Dr [name]  . ATRIAL FLUTTER ABLATION N/A 11/06/2011   Procedure: ATRIAL FLUTTER ABLATION;  Surgeon: Thompson Grayer, MD;  Location: [facility] CATH LAB;  Service: Cardiovascular;  Laterality: N/A;  . Cataracts    . IR GENERIC HISTORICAL  04/09/2016   IR US GUIDE VASC ACCESS RIGHT 04/09/2016 [name], DO [facility]-INTERV RAD  . IR GENERIC HISTORICAL  04/09/2016   IR FLUORO GUIDE PORT INSERTION RIGHT 04/09/2016 [name], DO [facility]INTER[facility] RAD  . TONSILLECTOMY      SOCIAL HISTORY: Social History   Socioeconomic History  . Marital status: Single    Spouse name: Not on file  . Number of children: Not on file  . Years of education: Not on file  . Highest education level: Not on file  Social Needs  . Financial resource strain: Somewhat hard  . Food insecurity - worry: Patient refused  . Food insecurity - inability: Patient refused  . Transportation needs - medical: Patient refused  . Transportation needs - non-medical: Patient refused  Occupational History  . Occupation: retired     Comment: Building services engineer  Tobacco Use  . Smoking status: Former Smoker    Packs/day: 0.50    Years: 47.00    Pack years: 23.50    Types: Cigarettes  . Smokeless tobacco: Former Systems developer    Quit date: 04/10/2014  . Tobacco comment: encouraged to quit today 03/04/12  Substance and Sexual Activity  . Alcohol use: No    Frequency: Never    Comment: quit 2 years ago  . Drug use: No    Comment: marajuana occasionally  . Sexual activity: Not Currently  Other Topics Concern  . Not on file  Social History Narrative   Lives in [place] alone.   Disabled due to COPD   Previousliy worked as a Theatre manager    FAMILY HISTORY: Family History  Problem Relation Age of Onset  . Diabetes Sister   . Hypertension Mother   . Lung cancer Mother   . Hypertension Father   . Heart attack Father   . Hypertension Sister   . Hypertension Sister   . Hypertension Sister   . Pulmonary fibrosis Sister     ALLERGIES:  is allergic to codeine.  MEDICATIONS:  Current Outpatient Medications  Medication Sig Dispense Refill  . acetaminophen (TYLENOL) 325 MG tablet Take every 6 (six) hours as needed by mouth.    Marland Kitchen albuterol (PROVENTIL HFA;VENTOLIN HFA) 108 (90 BASE) MCG/ACT inhaler Inhale 2 puffs into the lungs every 6 (six) hours as needed. For shortness of breath    . aspirin EC 81 MG  tablet Take 81 mg by mouth daily.    . beclomethasone (QVAR) 80 MCG/ACT inhaler Inhale 1 puff daily into the lungs.    . digoxin (LANOXIN) 0.125 MG tablet Take 125 mcg by mouth daily.  0  . diltiazem (TIAZAC) 360 MG 24 hr capsule Take 360 mg by mouth daily.  0  . furosemide (LASIX) 40 MG tablet Take 40 mg by mouth daily.    . gabapentin (NEURONTIN) 300 MG capsule Take 400 mg 3 (three) times daily by mouth.     . guaiFENesin (MUCINEX) 600 MG 12 hr tablet Take 1,200 mg daily by mouth.    . insulin glargine (LANTUS) 100 UNIT/ML injection Inject 25 Units into the skin 2 (two) times daily.    . levalbuterol (XOPENEX) 0.63 MG/3ML nebulizer solution Inhale 3 mLs 4 (four) times daily into the lungs.  0  . metoprolol tartrate (LOPRESSOR) 50 MG tablet Take 1 tablet (50 mg total) by mouth 2 (two) times daily. 180 tablet 3  . Multiple Vitamin (MULTIVITAMIN) capsule Take by mouth.    . oxyCODONE-acetaminophen (PERCOCET/ROXICET) 5-325 MG tablet Take 1 tablet every 6 (six) hours as needed by mouth for severe pain. 30 tablet 0  . Rivaroxaban (XARELTO) 15 MG TABS tablet Take 15 mg daily by mouth.    . SYMBICORT 160-4.5 MCG/ACT inhaler Inhale 2 puffs 2 (two) times daily into the lungs.  1  . tamsulosin (FLOMAX) 0.4 MG CAPS capsule  Take 0.4 mg 2 (two) times daily after a meal by mouth.      No current facility-administered medications for this visit.    Review of Systems  Constitutional: Positive for malaise/fatigue.       Appetite is good  HENT: Negative.   Eyes: Negative.   Respiratory: Positive for shortness of breath.        COPD  Gastrointestinal: Negative.   Genitourinary: Negative.   Musculoskeletal: Negative.   Skin: Negative.   Neurological: Negative for weakness.  Endo/Heme/Allergies: Negative.   Psychiatric/Behavioral: Negative.   All other systems reviewed and are negative. 14 point ROS was done and is otherwise as detailed above or in HPI  PHYSICAL EXAMINATION: ECOG PERFORMANCE STATUS: 2 - Symptomatic, <50% confined to bed  Vitals:   12/25/16 1422  BP: 108/69  Pulse: 88  Resp: 20  Temp: 98.2 F (36.8 C)  SpO2: 96%   Filed Weights   12/25/16 1422  Weight: 270 lb 1.6 oz (122.5 kg)      Physical Exam  Constitutional: He is oriented to person, place, and time and well-developed, well-nourished, and in no distress.  HENT:  Head: Normocephalic and atraumatic.  Mouth/Throat: Oropharynx is clear and moist.  Eyes: Conjunctivae and EOM are normal. Pupils are equal, round, and reactive to light.  Neck: Normal range of motion. Neck supple.  Cardiovascular: Normal rate, regular rhythm and normal heart sounds.  Pulmonary/Chest: Effort normal and breath sounds normal. No respiratory distress.  Abdominal: Soft. Bowel sounds are normal.  Musculoskeletal: Normal range of motion.  Neurological: He is alert and oriented to person, place, and time. Gait normal.  Skin: Skin is warm and dry.  Nursing note and vitals reviewed.  LABORATORY DATA:  I have reviewed the data as listed. Lab Results  Component Value Date   WBC 5.5 12/02/2016   HGB 9.9 (L) 12/02/2016   HCT 31.2 (L) 12/02/2016   MCV 103.3 (H) 12/02/2016   PLT 129 (L) 12/02/2016   CMP     Component Value Date/Time     NA 143  12/11/2016 1239   K 3.9 12/11/2016 1239   CL 103 12/11/2016 1239   CO2 30 12/11/2016 1239   GLUCOSE 146 (H) 12/11/2016 1239   BUN 12 12/11/2016 1239   CREATININE 1.10 12/11/2016 1239   CALCIUM 9.1 12/11/2016 1239   PROT 4.8 (L) 12/02/2016 0455   ALBUMIN 2.9 (L) 12/02/2016 0455   AST 20 12/02/2016 0455   ALT 24 12/02/2016 0455   ALKPHOS 42 12/02/2016 0455   BILITOT 0.6 12/02/2016 0455   GFRNONAA >60 12/03/2016 0406   GFRAA >60 12/03/2016 0406   PATHOLOGY:  Outside report 03/22/2016 Diagnosis: "RIGHT UPPER LOBE MASS"; BIOPSY: High grade neuroendocrine carcinoma, most consistent with small cell carcinoma.  Note: the tumor cells are positive for CK7, TTF-1 (diffuse and strong), synaptophysin (diffuse and strong), chromogranin (focal and weak) while negative for Napsin A, CK20, CDX2 and p63 supporting the diagnosis.  A Ki67 stain shows a proliferative rate of 40%  RADIOGRAPHIC STUDIES: I have personally reviewed the radiological images as listed and agreed with the findings in the report.  PET scan 12/22/2015 IMPRESSION: 1. Hypermetabolic RIGHT upper lobe mass abutting pleural surface of the RIGHT lung apex consistent with primary bronchogenic carcinoma. 2. No evidence of metastatic disease.  T2a N0 M0 . 3. Paraseptal emphysema.  Outside Imaging 12/09/2015 Chest CT IMPRESSION: 4 cm spiculated right apical mass, adjacent to 2nd rib. No mediastinal adenopathy.   MRI HEAD WITHOUT AND WITH CONTRAST 04/09/2016  IMPRESSION: Negative for metastatic disease to the brain.  No acute abnormality.  NUCLEAR MEDICINE PET SKULL BASE TO THIGH 04/13/2016   IMPRESSION: 1. The right apical lung mass has significantly enlarged and there is new right paratracheal adenopathy as well as a hypermetabolic new lymph node in the right inner infraclavicular fossa potentially with early impingement on adjacent neurovascular structures. 2. Other imaging findings of potential clinical  significance: Chronic bilateral maxillary sinusitis. Left carotid atherosclerotic calcification. Aortoiliac atherosclerotic vascular disease. Coronary atherosclerosis. Paraseptal emphysema. Mild atelectasis in the lung bases. Scattered sigmoid colon diverticula. Probable right pars defect at L5.   PET 08/21/16:  Study Result   CLINICAL DATA:  Subsequent treatment strategy for small cell lung cancer.  EXAM: NUCLEAR MEDICINE PET SKULL BASE TO THIGH  TECHNIQUE: 14.4 mCi F-18 FDG was injected intravenously. Full-ring PET imaging was performed from the skull base to thigh after the radiotracer. CT data was obtained and used for attenuation correction and anatomic localization.  FASTING BLOOD GLUCOSE:  Value: 150 mg/dl  COMPARISON:  PET-CT scan 04/13/2016  FINDINGS: NECK  No hypermetabolic lymph nodes in the neck.  CHEST  Marked reduction in size and metabolic activity of RIGHT upper lobe mass. Mass measures 3.0 cm decreased from 6.0 cm with SUV max equal 4.6 decreased from 10.8.  Resolution of hypermetabolic RIGHT lower paratracheal lymph node with no remaining pathologic enlarged lymph node.  Additional resolution of RIGHT infraclavicular lymph node.  No new metastatic adenopathy in the mediastinum or supraclavicular locations.  No new pulmonary nodules.  ABDOMEN/PELVIS  No hypermetabolic activity in the liver or pancreas. Adrenal glands are normal. No hypermetabolic abdominopelvic lymph nodes.  Atherosclerotic calcification of aorta.  SKELETON  No focal hypermetabolic activity to suggest skeletal metastasis.  IMPRESSION: 1. Marked reduction in size and metabolic activity of RIGHT upper lobe mass. 2. Resolution of mediastinal and RIGHT supraclavicular lymphadenopathy. 3. No evidence of disease progression.   Electronically Signed   By: Suzy Bouchard M.D.   On: 08/21/2016 16:41    ASSESSMENT &  PLAN:  Small cell carcinoma of  RUL  Cancer Staging Small cell lung cancer ([place]) Staging form: Lung, AJCC 8th Edition - Clinical: Stage IIIC (cT3, cN3, cM0) - Signed by Twana First, MD on 04/16/2016  Treatment consisted of 4 cycles of chemo with cisplatin +etoposise 04/16/16- 06/21/16 along with concurrent RT which he completed on 07/12/16. S/p PCI.  PLAN: -Reviewed patient's CT chest performed on 12/01/2016 which showed continued contraction of the right upper lobe mass which previously measured 2.2 x 1.7 cm and now is 2.8 x 1.6 cm. No evidence of new metastatic disease.  -Continue surveillance at this time.  -Repeat surveillance CT C/A/P in 3 months. -RTC in 3 months with labs and CT C/A/P. -Follow up with cardiology for his cardiac issues.  Orders Placed This Encounter  Procedures  . CT Abdomen Pelvis W Contrast    Standing Status:   Future    Standing Expiration Date:   12/25/2017    Order Specific Question:   If indicated for the ordered procedure, I authorize the administration of contrast media per Radiology protocol    Answer:   Yes    Order Specific Question:   Preferred imaging location?    Answer:   [facility]    Order Specific Question:   Radiology Contrast Protocol - do NOT remove file path    Answer:   [email or web address]  . CT Chest W Contrast    Standing Status:   Future    Standing Expiration Date:   12/25/2017    Order Specific Question:   If indicated for the ordered procedure, I authorize the administration of contrast media per Radiology protocol    Answer:   Yes    Order Specific Question:   Preferred imaging location?    Answer:   [facility]    Order Specific Question:   Radiology Contrast Protocol - do NOT remove file path    Answer:   [email or web address]  . CBC with Differential    Standing Status:   Future    Standing Expiration Date:   12/25/2017  . Comprehensive metabolic panel    Standing Status:   Future     Standing Expiration Date:   12/25/2017     All questions were answered. The patient knows to call the clinic with any problems, questions or concerns.  This note was electronically signed.    Twana First, MD 12/25/2016 3:29 PM

## 2016-12-25 NOTE — Patient Instructions (Signed)
[facility] at [facility] Discharge Instructions  RECOMMENDATIONS MADE BY THE CONSULTANT AND ANY TEST RESULTS WILL BE SENT TO YOUR REFERRING PHYSICIAN.  You were seen today by Dr. Louise Zhou    Thank you for choosing [facility] at [facility] to provide your oncology and hematology care.  To afford each patient quality time with our provider, please arrive at least 15 minutes before your scheduled appointment time.    If you have a lab appointment with the Cancer Center please come in thru the  Main Entrance and check in at the main information desk  You need to re-schedule your appointment should you arrive 10 or more minutes late.  We strive to give you quality time with our providers, and arriving late affects you and other patients whose appointments are after yours.  Also, if you no show three or more times for appointments you may be dismissed from the clinic at the providers discretion.     Again, thank you for choosing [facility].  Our hope is that these requests will decrease the amount of time that you wait before being seen by our physicians.       _____________________________________________________________  Should you have questions after your visit to [facility], please contact our office at [phone] between the hours of 8:30 a.m. and 4:30 p.m.  Voicemails left after 4:30 p.m. will not be returned until the following business day.  For prescription refill requests, have your pharmacy contact our office.       Resources For Cancer Patients and their Caregivers ? American Cancer Society: Can assist with transportation, wigs, general needs, runs Look Good Feel Better.        1-888-227-6333 ? Cancer Care: Provides financial assistance, online support groups, medication/co-pay assistance.  1-800-813-HOPE (4673) ? [facility] Assists [place] cancer patients and their  families through emotional , educational and financial support.  336-427-4357 ? [place] DSS Where to apply for food stamps, Medicaid and utility assistance. 336-342-1394 ? [facility]: Transportation to medical appointments. 336-347-2287 ? Social Security Administration: May apply for disability if have a Stage IV cancer. 336-342-7796 1-800-772-1213 ? [place] Aging, Disability and Transit Services: Assists with nutrition, care and transit needs. 336-349-2343  Cancer Center Support Programs: @10RELATIVEDAYS@ > Cancer Support Group  2nd Tuesday of the month 1pm-2pm, Journey Room  > Creative Journey  3rd Tuesday of the month 1130am-1pm, Journey Room  > Look Good Feel Better  1st Wednesday of the month 10am-12 noon, Journey Room (Call American Cancer Society to register 1-800-395-5775)    

## 2016-12-26 ENCOUNTER — Ambulatory Visit (INDEPENDENT_AMBULATORY_CARE_PROVIDER_SITE_OTHER): Payer: Medicare Other | Admitting: Family Medicine

## 2016-12-26 ENCOUNTER — Encounter: Payer: Self-pay | Admitting: Family Medicine

## 2016-12-26 ENCOUNTER — Other Ambulatory Visit: Payer: Self-pay

## 2016-12-26 VITALS — BP 112/70 | HR 103 | Temp 97.7°F | Resp 16 | Ht 73.0 in | Wt 277.0 lb

## 2016-12-26 DIAGNOSIS — I482 Chronic atrial fibrillation: Secondary | ICD-10-CM | POA: Diagnosis not present

## 2016-12-26 DIAGNOSIS — Z79899 Other long term (current) drug therapy: Secondary | ICD-10-CM | POA: Diagnosis not present

## 2016-12-26 DIAGNOSIS — I4821 Permanent atrial fibrillation: Secondary | ICD-10-CM

## 2016-12-26 DIAGNOSIS — J189 Pneumonia, unspecified organism: Secondary | ICD-10-CM

## 2016-12-26 DIAGNOSIS — Y842 Radiological procedure and radiotherapy as the cause of abnormal reaction of the patient, or of later complication, without mention of misadventure at the time of the procedure: Secondary | ICD-10-CM | POA: Diagnosis not present

## 2016-12-26 DIAGNOSIS — R7881 Bacteremia: Secondary | ICD-10-CM

## 2016-12-26 DIAGNOSIS — E1169 Type 2 diabetes mellitus with other specified complication: Secondary | ICD-10-CM

## 2016-12-26 DIAGNOSIS — I10 Essential (primary) hypertension: Secondary | ICD-10-CM

## 2016-12-26 DIAGNOSIS — G8928 Other chronic postprocedural pain: Secondary | ICD-10-CM | POA: Diagnosis not present

## 2016-12-26 DIAGNOSIS — E669 Obesity, unspecified: Secondary | ICD-10-CM | POA: Diagnosis not present

## 2016-12-26 LAB — HEPATIC FUNCTION PANEL
AG Ratio: 1.7 (calc) (ref 1.0–2.5)
ALT: 23 U/L (ref 9–46)
AST: 21 U/L (ref 10–35)
Albumin: 3.8 g/dL (ref 3.6–5.1)
Alkaline phosphatase (APISO): 56 U/L (ref 40–115)
Bilirubin, Direct: 0.1 mg/dL (ref 0.0–0.2)
Globulin: 2.2 g/dL (calc) (ref 1.9–3.7)
Indirect Bilirubin: 0.4 mg/dL (calc) (ref 0.2–1.2)
Total Bilirubin: 0.5 mg/dL (ref 0.2–1.2)
Total Protein: 6 g/dL — ABNORMAL LOW (ref 6.1–8.1)

## 2016-12-26 LAB — LIPID PANEL
Cholesterol: 135 mg/dL (ref <200)
HDL: 42 mg/dL (ref >40)
LDL Cholesterol (Calc): 72 mg/dL (calc)
Non-HDL Cholesterol (Calc): 93 mg/dL (calc) (ref <130)
Total CHOL/HDL Ratio: 3.2 (calc) (ref <5.0)
Triglycerides: 125 mg/dL (ref <150)

## 2016-12-26 LAB — BASIC METABOLIC PANEL
BUN: 17 mg/dL (ref 7–25)
CO2: 33 mmol/L — ABNORMAL HIGH (ref 20–32)
Calcium: 9.5 mg/dL (ref 8.6–10.3)
Chloride: 100 mmol/L (ref 98–110)
Creat: 1.15 mg/dL (ref 0.70–1.25)
Glucose, Bld: 122 mg/dL (ref 65–139)
Potassium: 4 mmol/L (ref 3.5–5.3)
Sodium: 142 mmol/L (ref 135–146)

## 2016-12-26 MED ORDER — HYDROCODONE-ACETAMINOPHEN 10-325 MG PO TABS: 1.0000 | ORAL_TABLET | Freq: Three times a day (TID) | ORAL | 0 refills | Status: DC | PRN

## 2016-12-26 NOTE — Patient Instructions (Signed)
Decrease the lantus to 20 units twice a day Record your blood sugars and let me know how they are running over the next 5 days Watch your diet and cut down on soda/candy  Follow up in one month.

## 2016-12-26 NOTE — Progress Notes (Signed)
Patient ID: Chase Herrera, male    DOB: Sep 18, 1950, 66 y.o.   MRN: [id number]  Chief Complaint  Patient presents with  . Transitions Of Care    Allergies Patient has no active allergies.  Subjective:   Chase Herrera is a 66 y.o. male who presents to [facility] today.  HPI Here to follow up after being discharged from the hosptial. Reports that he has been doing well since d/c from the hospital.  Has been treated for pneumonia and gram-positive sepsis.  Reports that he had had trouble with his heart and his A. fib also at the hospital.  Is on some new medications to control his heart rate.  Reports he is felt much better since coming home.  Reports that his appetite is good.  Has completed all the antibiotics for his pneumonia.  Denies any fever, chills, nausea, vomiting, or diarrhea.  Is beginning to get some of his energy back.  Does not feel sad. Gets out of the house on a daily basis.  Reports that he always has plenty of food.  Reports his church brought him a big meal over yesterday. Reports he has been using the lantus twice a day at 25 units twice a day.  Has not been having to use his SSI, Humalog.  Reports that his blood sugars have been running well.  Have been less than 150s.  Denies any hypoglycemic episodes.  Was seen by Dr. Talbert Cage at the cancer center yesterday.  Has some upcoming CTs of the chest abdomen and pelvis which are scheduled for a few months down the road.  He would like to discuss the prescription for his oxycodone.  Reports he has been on this medication to use as needed for musculoskeletal chest pains.  Reports he has had this pain ever since going through chemo therapy and radiation.  Gets the pain in his center and the sides of his chest.  Reports that it can be rather debilitating thing.  Was given oxycodone for this.  Reports that he has never abused his medication.  Does not take the medication every day.  Only uses it when it is absolutely necessary.   He reports that if he does not have the medicine he will have to go to the emergency department for the medications.  The note from Dr. Donnamarie Rossetti was reviewed which reported that patient had never had any discrepancies or problems with the medication.  Patient reports that he will be getting a pacemaker placed soon.  Has another follow-up with cardiology.  Has been placed on digoxin and diltiazem.  Reports that he does not feel any chest pain, shortness of breath, palpitations.  He reports he can walk and get around his house and out and about without problems.  He reports he does occasionally get winded.  Has a history of COPD and is on multiple inhalers.  Quit smoking over 3 years ago.  Has an upcoming appointment with Dr. Luan Pulling in January.  Still has concerns that he could have some sleep apnea.  Has a history of diabetes and hypertension.  Has never been on any cholesterol medication.  Reports he is always been told his cholesterol was very well controlled.  However he does report he has not had it checked in 3-4 years.    Past Medical History:  Diagnosis Date  . Alcohol abuse    Quit 08/23/11  . Arthritis   . Atrial fibrillation ([place])   . Atrial flutter (  [place])    CTI ablation by Dr [name] 10/2011  . COPD (chronic obstructive pulmonary disease) ([place])   . Depression   . Essential hypertension   . History of cardiomyopathy    LVEF 25-30% 08/2011 with subsequent normalization  . Obesity   . Prostate enlargement   . Small cell lung cancer ([place])    Right upper lobe - follows with [facility]  . Type 2 diabetes mellitus ([place])     Past Surgical History:  Procedure Laterality Date  . APPENDECTOMY    . ATRIAL ABLATION SURGERY  11/06/2011   CTI ablation for atrial flutter by Dr [name]  . ATRIAL FLUTTER ABLATION N/A 11/06/2011   Procedure: ATRIAL FLUTTER ABLATION;  Surgeon: Thompson Grayer, MD;  Location: [facility] CATH LAB;  Service: Cardiovascular;  Laterality: N/A;  . Cataracts    . IR GENERIC  HISTORICAL  04/09/2016   IR US GUIDE VASC ACCESS RIGHT 04/09/2016 [name], DO [facility]  . IR GENERIC HISTORICAL  04/09/2016   IR FLUORO GUIDE PORT INSERTION RIGHT 04/09/2016 [name], DO [facility]  . TONSILLECTOMY      Family History  Problem Relation Age of Onset  . Diabetes Sister   . Hypertension Mother   . Lung cancer Mother   . Hypertension Father   . Heart attack Father   . Hypertension Sister   . Hypertension Sister   . Hypertension Sister   . Pulmonary fibrosis Sister      Social History   Socioeconomic History  . Marital status: Single    Spouse name: None  . Number of children: None  . Years of education: None  . Highest education level: None  Social Needs  . Financial resource strain: Somewhat hard  . Food insecurity - worry: Patient refused  . Food insecurity - inability: Patient refused  . Transportation needs - medical: Patient refused  . Transportation needs - non-medical: Patient refused  Occupational History  . Occupation: retired     Comment: Building services engineer  Tobacco Use  . Smoking status: Former Smoker    Packs/day: 0.50    Years: 47.00    Pack years: 23.50    Types: Cigarettes  . Smokeless tobacco: Former Systems developer    Quit date: 04/10/2014  . Tobacco comment: encouraged to quit today 03/04/12  Substance and Sexual Activity  . Alcohol use: No    Frequency: Never    Comment: quit 2 years ago  . Drug use: No    Comment: marajuana occasionally  . Sexual activity: Not Currently  Other Topics Concern  . None  Social History Narrative   Lives in [place] alone.   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 He appears well-developed and well-nourished.  Obese, pleasant male, sitting in wheelchair, voice raspy, NAD, somewhat pale.  HENT:  Head: Normocephalic and atraumatic.  Eyes: EOM are normal. Pupils are equal, round, and reactive to light. No scleral icterus.  Neck: Normal range of motion. Neck supple.  Cardiovascular: Normal rate,  regular rhythm and normal heart sounds.  Pulmonary/Chest: Effort normal and breath sounds normal.  Abdominal: Soft. Bowel sounds are normal. He exhibits no distension.  Neurological: He is alert and oriented to person, place, and time.  Skin: Skin is warm and dry.  Psychiatric: He has a normal mood and affect. His behavior is normal. Judgment and thought content normal.  Vitals reviewed.    Assessment and Plan  1. Diabetes mellitus type 2 in obese (HCC) Decrease Lantus to 20 units subcutaneously twice a day.  Patient is asked to monitor his blood sugars and call within the next 5 days with his readings.  We will adjust his insulin as needed.  We discussed when he should use his sliding scale insulin.  We discussed signs and symptoms of hypoglycemia.  We will check cholesterol today.  LDL goal should be less than 70. - Lipid panel  2. Permanent atrial fibrillation [facility]) Keep scheduled appointment with cardiology.  3. Bacteremia due to Gram-positive bacteria Hospital records requested.  Patient reportedly completed all of antibiotics.  4. Healthcare-associated pneumonia Antibiotics completed.  Patient was counseled that if he develops any worrisome signs and symptoms such as fever, chills, nausea, vomiting, or shortness of breath to please call, return to office or go to the emergency department.  5. Essential hypertension, benign Blood pressure is well controlled.  Continue current medications. 6. High risk medication use Check labs today.  In addition we will review the labs which were done yesterday at [facility]. - Hepatic function panel - Basic Metabolic Panel (BMET)  7. Chronic pain after radiotherapy Prescription was sent electronically for Vicodin 5/325 mg.  1 p.o. 3 times daily as needed moderate pain.  #40 with no refills.  Long discussion today with patient regarding risks versus benefits of medication.  Narcotics and possible side effects discussed with patient.  He has chronic pain after receiving radiation and chemo to his chest for small cell lung cancer.  He has been on this medication for extended period of time without problematic behaviors or signs and symptoms of addiction.  Will refill at this time.  Discussed with patient will try Vicodin versus oxycodone at this time to step down his dosing and strength of medication.  He was agreeable to this trial.  He will call with any questions or concerns.  We did discuss not to take more medications than directed due to possible respiratory depression and other side effects.  He voiced understanding.  Hospital records reviewed.  Keep scheduled appointments.  Patient is somewhat debilitated secondary to his cardiac and pulmonary problems.  He defers any physical therapy at this time.  If he changes his mind he will let me know.  I do believe that patient has significant degree of deconditioning and would benefit from some rehab.  Office visit was greater than 30 minutes.  Greater than 50% of office visit spent counseling and coordinating care.  Records requested. Return for follow up. [name], MD 12/26/2016

## 2016-12-27 ENCOUNTER — Encounter: Payer: Self-pay | Admitting: Family Medicine

## 2016-12-27 DIAGNOSIS — C3490 Malignant neoplasm of unspecified part of unspecified bronchus or lung: Secondary | ICD-10-CM | POA: Diagnosis not present

## 2016-12-27 DIAGNOSIS — Z923 Personal history of irradiation: Secondary | ICD-10-CM | POA: Diagnosis not present

## 2016-12-27 DIAGNOSIS — Z794 Long term (current) use of insulin: Secondary | ICD-10-CM | POA: Diagnosis not present

## 2016-12-27 DIAGNOSIS — R05 Cough: Secondary | ICD-10-CM | POA: Diagnosis not present

## 2016-12-27 DIAGNOSIS — Z87891 Personal history of nicotine dependence: Secondary | ICD-10-CM | POA: Diagnosis not present

## 2016-12-27 DIAGNOSIS — E119 Type 2 diabetes mellitus without complications: Secondary | ICD-10-CM | POA: Diagnosis not present

## 2016-12-27 DIAGNOSIS — Z836 Family history of other diseases of the respiratory system: Secondary | ICD-10-CM | POA: Diagnosis not present

## 2016-12-27 DIAGNOSIS — Z8249 Family history of ischemic heart disease and other diseases of the circulatory system: Secondary | ICD-10-CM | POA: Diagnosis not present

## 2016-12-27 DIAGNOSIS — J441 Chronic obstructive pulmonary disease with (acute) exacerbation: Secondary | ICD-10-CM | POA: Diagnosis not present

## 2016-12-27 DIAGNOSIS — I110 Hypertensive heart disease with heart failure: Secondary | ICD-10-CM | POA: Diagnosis not present

## 2016-12-27 DIAGNOSIS — I509 Heart failure, unspecified: Secondary | ICD-10-CM | POA: Diagnosis not present

## 2016-12-27 DIAGNOSIS — R0602 Shortness of breath: Secondary | ICD-10-CM | POA: Diagnosis not present

## 2016-12-27 DIAGNOSIS — Z85118 Personal history of other malignant neoplasm of bronchus and lung: Secondary | ICD-10-CM | POA: Diagnosis not present

## 2016-12-27 DIAGNOSIS — Z7901 Long term (current) use of anticoagulants: Secondary | ICD-10-CM | POA: Diagnosis not present

## 2016-12-27 DIAGNOSIS — Z7982 Long term (current) use of aspirin: Secondary | ICD-10-CM | POA: Diagnosis not present

## 2016-12-27 DIAGNOSIS — N400 Enlarged prostate without lower urinary tract symptoms: Secondary | ICD-10-CM | POA: Diagnosis not present

## 2016-12-27 DIAGNOSIS — I4891 Unspecified atrial fibrillation: Secondary | ICD-10-CM | POA: Diagnosis not present

## 2016-12-28 ENCOUNTER — Ambulatory Visit (INDEPENDENT_AMBULATORY_CARE_PROVIDER_SITE_OTHER): Payer: Medicare Other | Admitting: Internal Medicine

## 2016-12-28 ENCOUNTER — Other Ambulatory Visit: Payer: Self-pay

## 2016-12-28 ENCOUNTER — Encounter: Payer: Self-pay | Admitting: Internal Medicine

## 2016-12-28 VITALS — BP 130/80 | HR 113 | Ht 73.0 in | Wt 270.0 lb

## 2016-12-28 DIAGNOSIS — J449 Chronic obstructive pulmonary disease, unspecified: Secondary | ICD-10-CM

## 2016-12-28 DIAGNOSIS — I428 Other cardiomyopathies: Secondary | ICD-10-CM | POA: Diagnosis not present

## 2016-12-28 DIAGNOSIS — I482 Chronic atrial fibrillation, unspecified: Secondary | ICD-10-CM

## 2016-12-28 DIAGNOSIS — I481 Persistent atrial fibrillation: Secondary | ICD-10-CM | POA: Diagnosis not present

## 2016-12-28 DIAGNOSIS — I484 Atypical atrial flutter: Secondary | ICD-10-CM

## 2016-12-28 DIAGNOSIS — I4891 Unspecified atrial fibrillation: Secondary | ICD-10-CM

## 2016-12-28 DIAGNOSIS — I4819 Other persistent atrial fibrillation: Secondary | ICD-10-CM

## 2016-12-28 NOTE — Progress Notes (Signed)
PCP: [name], MD Primary Cardiologist: Dr [name] Primary EP: Dr [name]  Chase Herrera is a 66 y.o. male who presents today for electrophysiology followup.  He continues to struggle with afib/ atypical atrial flutter.  He has had multiple hospitalizations for this.  He is chronically ill and deconditioned.  He has significant difficulty with SOB.  He is in a wheelchair and on O2 today.  He is also recovering from recent infection/ bacteremia.   Past Medical History:  Diagnosis Date  . Alcohol abuse    Quit 08/23/11  . Arthritis   . Atrial fibrillation ([place])   . Atrial flutter ([place])    CTI ablation by Dr [name] 10/2011  . COPD (chronic obstructive pulmonary disease) ([place])   . Depression   . Essential hypertension   . History of cardiomyopathy    LVEF 25-30% 08/2011 with subsequent normalization  . Obesity   . Prostate enlargement   . Small cell lung cancer ([place])    Right upper lobe - follows with [facility]  . Type 2 diabetes mellitus ([place])    Past Surgical History:  Procedure Laterality Date  . APPENDECTOMY    . ATRIAL ABLATION SURGERY  11/06/2011   CTI ablation for atrial flutter by Dr [name]  . ATRIAL FLUTTER ABLATION N/A 11/06/2011   Procedure: ATRIAL FLUTTER ABLATION;  Surgeon: Thompson Grayer, MD;  Location: [facility] CATH LAB;  Service: Cardiovascular;  Laterality: N/A;  . Cataracts    . IR GENERIC HISTORICAL  04/09/2016   IR US GUIDE VASC ACCESS RIGHT 04/09/2016 [name], DO [facility]  . IR GENERIC HISTORICAL  04/09/2016   IR FLUORO GUIDE PORT INSERTION RIGHT 04/09/2016 [name], DO [facility]  . TONSILLECTOMY      ROS- all systems are reviewed and negatives except as per HPI above  Current Outpatient Medications  Medication Sig Dispense Refill  . acetaminophen (TYLENOL) 325 MG tablet Take every 6 (six) hours as needed by mouth.    Marland Kitchen albuterol (PROVENTIL HFA;VENTOLIN HFA) 108 (90 BASE) MCG/ACT inhaler Inhale 2 puffs into the lungs every 6 (six)  hours as needed. For shortness of breath    . aspirin EC 81 MG tablet Take 81 mg by mouth daily.    . beclomethasone (QVAR) 80 MCG/ACT inhaler Inhale 1 puff daily into the lungs.    . digoxin (LANOXIN) 0.125 MG tablet Take 125 mcg by mouth daily.  0  . diltiazem (TIAZAC) 360 MG 24 hr capsule Take 360 mg by mouth daily.  0  . furosemide (LASIX) 40 MG tablet Take 40 mg by mouth daily.    Marland Kitchen gabapentin (NEURONTIN) 300 MG capsule Take 400 mg 3 (three) times daily by mouth.     Marland Kitchen guaiFENesin (MUCINEX) 600 MG 12 hr tablet Take 1,200 mg daily by mouth.    Marland Kitchen HYDROcodone-acetaminophen (NORCO) 10-325 MG tablet Take 1 tablet by mouth every 8 (eight) hours as needed for moderate pain. 40 tablet 0  . insulin glargine (LANTUS) 100 UNIT/ML injection Inject 25 Units into the skin 2 (two) times daily.    Marland Kitchen levalbuterol (XOPENEX) 0.63 MG/3ML nebulizer solution Inhale 3 mLs 4 (four) times daily into the lungs.  0  . metoprolol tartrate (LOPRESSOR) 50 MG tablet Take 1 tablet (50 mg total) by mouth 2 (two) times daily. 180 tablet 3  . Multiple Vitamin (MULTIVITAMIN) capsule Take by mouth.    . oxyCODONE-acetaminophen (PERCOCET/ROXICET) 5-325 MG tablet Take 1 tablet every 6 (six) hours as needed  by mouth for severe pain. 30 tablet 0  . Rivaroxaban (XARELTO) 15 MG TABS tablet Take 15 mg daily by mouth.    . SYMBICORT 160-4.5 MCG/ACT inhaler Inhale 2 puffs 2 (two) times daily into the lungs.  1  . tamsulosin (FLOMAX) 0.4 MG CAPS capsule Take 0.4 mg 2 (two) times daily after a meal by mouth.      No current facility-administered medications for this visit.     Physical Exam: Vitals:   12/28/16 0937  BP: 130/80  Pulse: (!) 113  SpO2: 94%  Weight: 270 lb (122.5 kg)  Height: 6\' 1"  (1.854 m)    GEN- The patient is chronically ill appearing, in a wheelchair,  alert and oriented x 3 today.  Wearing O2 Head- normocephalic, atraumatic Eyes-  Sclera clear, conjunctiva pink Ears- hearing intact Oropharynx- clear with  very poor dentition Lungs- Clear to ausculation bilaterally, normal work of breathing Heart- tachycardic irregular rhythm GI- soft, NT, ND, + BS Extremities- no clubbing, cyanosis, + dependant edema  EKG tracing ordered today is personally reviewed and shows atypical atrial flutter with RVR  Assessment and Plan:  1. Atypical flutter and persistent afib with RVR Very difficult situation.  Recently failed medical therapy with tikosyn.  Will not take amiodarone due to concerns of interaction.  He has significant comorbidites and also atrial enlargement.  He is very symptomatic. He says that Dr Caryl Comes had suggested AV nodal ablation and PPM.  Given his recent bacteremia of unknown source, very poor dentition, and risks of recurrent bacteremia, I would NOT advise AV nodal ablation.  He is unfortunately a poor candidate for ablation.  I think the best next step is to refer to [facility] for consideration of a convergent procedure/ additional EP consultation.  I will therefore refer to Dr Willis Modena at this time. He will follow with Dr [name] and I will see as needed going forward.  Very complicated patient with medicine refractory atrial fibrillation and complex medical history.  A high level of decision making was required for this encounter.  Thompson Grayer MD, [facility] 12/28/2016 4:46 PM

## 2016-12-28 NOTE — Patient Instructions (Addendum)
Medication Instructions:  Continue all current medications.  Labwork: none  Testing/Procedures: none  Follow-Up:  Your physician recommends that you schedule a follow-up appointment in:  As needed.   Any Other Special Instructions Will Be Listed Below (If Applicable). You have been referred to:  [facility] - Dr. Willis Modena   If you need a refill on your cardiac medications before your next appointment, please call your pharmacy.

## 2016-12-31 DIAGNOSIS — I509 Heart failure, unspecified: Secondary | ICD-10-CM | POA: Diagnosis not present

## 2017-01-03 ENCOUNTER — Encounter: Payer: Self-pay | Admitting: Family Medicine

## 2017-01-03 ENCOUNTER — Other Ambulatory Visit: Payer: Self-pay

## 2017-01-03 ENCOUNTER — Ambulatory Visit (INDEPENDENT_AMBULATORY_CARE_PROVIDER_SITE_OTHER): Payer: Medicare Other | Admitting: Family Medicine

## 2017-01-03 VITALS — BP 120/60 | HR 86 | Temp 98.3°F | Resp 16

## 2017-01-03 DIAGNOSIS — E118 Type 2 diabetes mellitus with unspecified complications: Secondary | ICD-10-CM

## 2017-01-03 DIAGNOSIS — N401 Enlarged prostate with lower urinary tract symptoms: Secondary | ICD-10-CM

## 2017-01-03 DIAGNOSIS — I482 Chronic atrial fibrillation, unspecified: Secondary | ICD-10-CM

## 2017-01-03 DIAGNOSIS — Z794 Long term (current) use of insulin: Secondary | ICD-10-CM | POA: Diagnosis not present

## 2017-01-03 DIAGNOSIS — I10 Essential (primary) hypertension: Secondary | ICD-10-CM

## 2017-01-03 DIAGNOSIS — I5031 Acute diastolic (congestive) heart failure: Secondary | ICD-10-CM

## 2017-01-03 DIAGNOSIS — J439 Emphysema, unspecified: Secondary | ICD-10-CM

## 2017-01-03 DIAGNOSIS — G609 Hereditary and idiopathic neuropathy, unspecified: Secondary | ICD-10-CM | POA: Diagnosis not present

## 2017-01-03 DIAGNOSIS — E119 Type 2 diabetes mellitus without complications: Secondary | ICD-10-CM | POA: Insufficient documentation

## 2017-01-03 MED ORDER — LEVALBUTEROL HCL 0.63 MG/3ML IN NEBU: 0.6300 mg | INHALATION_SOLUTION | Freq: Four times a day (QID) | RESPIRATORY_TRACT | 0 refills | Status: AC

## 2017-01-03 MED ORDER — TAMSULOSIN HCL 0.4 MG PO CAPS: 0.4000 mg | ORAL_CAPSULE | Freq: Two times a day (BID) | ORAL | 1 refills | Status: AC

## 2017-01-03 MED ORDER — DILTIAZEM HCL ER BEADS 360 MG PO CP24: 360.0000 mg | ORAL_CAPSULE | Freq: Every day | ORAL | 1 refills | Status: DC

## 2017-01-03 MED ORDER — DIGOXIN 125 MCG PO TABS: 125.0000 ug | ORAL_TABLET | Freq: Every day | ORAL | 1 refills | Status: DC

## 2017-01-03 MED ORDER — ATORVASTATIN CALCIUM 20 MG PO TABS: 20.0000 mg | ORAL_TABLET | Freq: Every day | ORAL | 3 refills | Status: AC

## 2017-01-03 MED ORDER — GABAPENTIN 400 MG PO CAPS: 400.0000 mg | ORAL_CAPSULE | Freq: Three times a day (TID) | ORAL | 3 refills | Status: DC

## 2017-01-03 MED ORDER — METFORMIN HCL 500 MG PO TABS: 1500.0000 mg | ORAL_TABLET | Freq: Every day | ORAL | 1 refills | Status: DC

## 2017-01-03 MED ORDER — RIVAROXABAN 15 MG PO TABS: 15.0000 mg | ORAL_TABLET | Freq: Every day | ORAL | 1 refills | Status: DC

## 2017-01-03 MED ORDER — METOPROLOL TARTRATE 50 MG PO TABS: 50.0000 mg | ORAL_TABLET | Freq: Two times a day (BID) | ORAL | 3 refills | Status: DC

## 2017-01-03 NOTE — Progress Notes (Signed)
Patient ID: Chase Herrera, male    DOB: December 26, 1950, 66 y.o.   MRN: [id number]  Chief Complaint  Patient presents with  . Follow-up    Allergies Patient has no known allergies.  Subjective:   Chase Herrera is a 66 y.o. male who presents to [facility] today.  HPI Chase Herrera presents today for follow-up visit.  He reports he wanted to come back to the office today to discuss his cholesterol.  He has never been on any cholesterol medication despite his diagnosis of diabetes type 2.  He denies any chronic muscle pain.  Reports he has 2 days left of his steroids.  Has decreased the Lantus to 20 units twice a day.  Reports that the highest his sugars have been is in the 150s.  Reports the lowest they have been is 107.  He denies any hypoglycemic episodes.  Denies any shortness of breath greater than his baseline.  Still continues to check his pulse ox on a daily basis.  Denies any increase in his lower extremity edema.  Has been seen by cardiology since his last visit in our office and we discussed why he should not get a pacemaker put in until absolute clearance of recent bacteremia.  He does have a follow-up appointment per his report at [facility] for possible procedure.   Lab Results  Component Value Date   CHOL 135 12/26/2016   HDL 42 12/26/2016   TRIG 125 12/26/2016   CHOLHDL 3.2 12/26/2016   Past Medical History:  Diagnosis Date  . Alcohol abuse    Quit 08/23/11  . Arthritis   . Atrial fibrillation ([place])   . Atrial flutter ([place])    CTI ablation by Dr [name] 10/2011  . COPD (chronic obstructive pulmonary disease) ([place])   . Depression   . Essential hypertension   . History of cardiomyopathy    LVEF 25-30% 08/2011 with subsequent normalization  . Obesity   . Prostate enlargement   . Small cell lung cancer ([place])    Right upper lobe - follows with [facility]  . Type 2 diabetes mellitus ([place])     Past Surgical History:  Procedure Laterality Date  .  APPENDECTOMY    . ATRIAL ABLATION SURGERY  11/06/2011   CTI ablation for atrial flutter by Dr [name]  . ATRIAL FLUTTER ABLATION N/A 11/06/2011   Procedure: ATRIAL FLUTTER ABLATION;  Surgeon: Thompson Grayer, MD;  Location: [facility] CATH LAB;  Service: Cardiovascular;  Laterality: N/A;  . Cataracts    . IR GENERIC HISTORICAL  04/09/2016   IR US GUIDE VASC ACCESS RIGHT 04/09/2016 [name], DO [facility]-INTERV RAD  . IR GENERIC HISTORICAL  04/09/2016   IR FLUORO GUIDE PORT INSERTION RIGHT 04/09/2016 [name], DO [facility]  . TONSILLECTOMY      Family History  Problem Relation Age of Onset  . Diabetes Sister   . Hypertension Mother   . Lung cancer Mother   . Hypertension Father   . Heart attack Father   . Hypertension Sister   . Hypertension Sister   . Hypertension Sister   . Pulmonary fibrosis Sister      Social History   Socioeconomic History  . Marital status: Single    Spouse name: None  . Number of children: None  . Years of education: None  . Highest education level: None  Social Needs  . Financial resource strain: Somewhat hard  . Food insecurity - worry: Patient refused  .  Food insecurity - inability: Patient refused  . Transportation needs - medical: Patient refused  . Transportation needs - non-medical: Patient refused  Occupational History  . Occupation: retired     Comment: Building services engineer  Tobacco Use  . Smoking status: Former Smoker    Packs/day: 0.50    Years: 47.00    Pack years: 23.50    Types: Cigarettes  . Smokeless tobacco: Former Systems developer    Quit date: 04/10/2014  . Tobacco comment: encouraged to quit today 03/04/12  Substance and Sexual Activity  . Alcohol use: No    Frequency: Never    Comment: quit 2 years ago  . Drug use: No    Comment: marajuana occasionally  . Sexual activity: Not Currently  Other Topics Concern  . None  Social History Narrative   Lives in [place] alone.   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  HENT: Negative for voice change.   Respiratory: Negative for chest tightness and wheezing.   Cardiovascular: Negative for chest pain and palpitations.  Gastrointestinal: Negative for abdominal pain.  Endocrine: Negative for polyuria.  Genitourinary: Negative for decreased urine volume.  Musculoskeletal: Negative for myalgias.  Neurological: Negative for dizziness, tremors, syncope, facial asymmetry and weakness.  Psychiatric/Behavioral: Negative for dysphoric mood. The patient is not nervous/anxious.      Objective:   BP 120/60 (BP Location: Left Arm, Patient Position: Sitting, Cuff Size: Normal)   Pulse 86   Temp 98.3 F (36.8 C)   Resp 16   SpO2 93%   Physical Exam  Constitutional: He is oriented to person, place, and time. He appears well-developed and well-nourished.  Cardiovascular: Normal rate, regular rhythm and normal heart sounds.  Pulmonary/Chest: Effort normal.  Diminished breath sounds throughout, but present.   Neurological: He is alert and oriented to person, place, and time. No cranial nerve deficit.  Skin: Skin is warm and dry.  Psychiatric: He has a normal mood and affect. His behavior is normal. Judgment and thought content normal.  Vitals reviewed.    Assessment and Plan  1. Essential hypertension Stable. Lifestyle modifications discussed with patient including a diet emphasizing vegetables, fruits, and whole grains. Limiting intake of sodium to less than 2,400 mg per day.  Recommendations discussed include consuming low-fat dairy products, poultry, fish, legumes, non-tropical vegetable oils, and nuts; and limiting intake of sweets, sugar-sweetened beverages, and red meat. Discussed following a plan such as the Dietary Approaches to Stop Hypertension (DASH) diet. Patient to read up on this diet.   - diltiazem (TIAZAC) 360 MG 24 hr  capsule; Take 1 capsule (360 mg total) by mouth daily.  Dispense: 90 capsule; Refill: 1  2. Controlled type 2 diabetes mellitus with complication, with long-term current use of insulin (HCC) Hemoglobin A1c reviewed with patient today.  He has 2 days left of his steroids and then he will send in readings of his blood sugars.  Denies any problems with the decrease of his Lantus to 20 units twice a day.  Counseling regarding hypoglycemia and hyperglycemia was given today. - metFORMIN (GLUCOPHAGE) 500 MG tablet; Take 3 tablets (1,500 mg total) by mouth daily with breakfast.  Dispense: 270 tablet; Refill: 1 - atorvastatin (LIPITOR) 20 MG tablet; Take 1 tablet (20 mg total) by mouth daily.  Dispense: 90 tablet; Refill: 3 Hyperlipidemia and the associated risk of ASCVD  were discussed today. Primary vs. Secondary prevention of ASCVD were discussed and how it relates to patient morbidity, mortality, and quality of life. Shared decision making with patient including the risks of statins vs.benefits of ASCVD risk reduction discussed.  Risks of stains discussed including myopathy, rhabdomyoloysis, liver problems, increased risk of diabetes discussed. We discussed heart healthy diet, lifestyle modifications, risk factor modifications, and adherence to the recommended treatment plan. We discussed the need to periodically monitor lipid panel and liver function tests while on statin therapy.  Today we discussed the reason why despite having an LDL of 73 that he was indicated for statin therapy to decrease his cardiovascular risk.  He is in agreement to try this medication.  We will plan on rechecking his cholesterol and liver tests in 3 months. 3. Chronic atrial fibrillation (HCC) Continue current medications.  Refills were given at patient request.  He will keep a scheduled visit at [facility] for possible ablation procedure. - metoprolol tartrate (LOPRESSOR) 50 MG tablet; Take 1 tablet (50 mg total) by mouth 2 (two)  times daily.  Dispense: 180 tablet; Refill: 3 - Rivaroxaban (XARELTO) 15 MG TABS tablet; Take 1 tablet (15 mg total) by mouth daily.  Dispense: 90 tablet; Refill: 1  4. Idiopathic peripheral neuropathy Stable, refilled. - gabapentin (NEURONTIN) 400 MG capsule; Take 1 capsule (400 mg total) by mouth 3 (three) times daily.  Dispense: 180 capsule; Refill: 3  5. Pulmonary emphysema, unspecified emphysema type (HCC)  - levalbuterol (XOPENEX) 0.63 MG/3ML nebulizer solution; Inhale 3 mLs (0.63 mg total) into the lungs 4 (four) times daily.  Dispense: 3 mL; Refill: 0  6. Benign prostatic hyperplasia with lower urinary tract symptoms, symptom details unspecified Stable.  Refilled. - tamsulosin (FLOMAX) 0.4 MG CAPS capsule; Take 1 capsule (0.4 mg total) by mouth 2 (two) times daily after a meal.  Dispense: 180 capsule; Refill: 1  7. Acute diastolic congestive heart failure ([place]) Continue digoxin at this time per cardiology.  Plan to check levels in 3 months. - digoxin (LANOXIN) 0.125 MG tablet; Take 1 tablet (125 mcg total) by mouth daily.  Dispense: 90 tablet; Refill: 1 Patient requests refills of all his medications today to be sent to the pharmacy so that he knows he will not run out.  He reports he does not need these refills at this time but wants to ensure that he has them available. Return in about 3 months (around 04/03/2017) for follow up. [name], MD 01/03/2017

## 2017-01-04 ENCOUNTER — Ambulatory Visit: Payer: Medicare Other | Admitting: Family Medicine

## 2017-01-08 DIAGNOSIS — H1032 Unspecified acute conjunctivitis, left eye: Secondary | ICD-10-CM | POA: Diagnosis not present

## 2017-01-08 DIAGNOSIS — Z961 Presence of intraocular lens: Secondary | ICD-10-CM | POA: Diagnosis not present

## 2017-01-12 ENCOUNTER — Other Ambulatory Visit: Payer: Self-pay | Admitting: Family Medicine

## 2017-01-13 DIAGNOSIS — J449 Chronic obstructive pulmonary disease, unspecified: Secondary | ICD-10-CM | POA: Diagnosis not present

## 2017-01-16 DIAGNOSIS — Z961 Presence of intraocular lens: Secondary | ICD-10-CM | POA: Diagnosis not present

## 2017-01-16 DIAGNOSIS — H1032 Unspecified acute conjunctivitis, left eye: Secondary | ICD-10-CM | POA: Diagnosis not present

## 2017-01-23 ENCOUNTER — Telehealth: Payer: Self-pay | Admitting: Family Medicine

## 2017-01-23 NOTE — Telephone Encounter (Signed)
Please advise what I need to tell patient  

## 2017-01-23 NOTE — Telephone Encounter (Signed)
Pt is calling in to advise that his sugar has been running 132 to 159 checking 3 times a day

## 2017-01-23 NOTE — Telephone Encounter (Signed)
Cell phone [phone]

## 2017-01-25 ENCOUNTER — Ambulatory Visit: Payer: Medicare Other | Admitting: Internal Medicine

## 2017-01-25 DIAGNOSIS — Z961 Presence of intraocular lens: Secondary | ICD-10-CM | POA: Diagnosis not present

## 2017-01-25 DIAGNOSIS — H1032 Unspecified acute conjunctivitis, left eye: Secondary | ICD-10-CM | POA: Diagnosis not present

## 2017-01-25 DIAGNOSIS — H02056 Trichiasis without entropian left eye, unspecified eyelid: Secondary | ICD-10-CM | POA: Diagnosis not present

## 2017-01-25 NOTE — Telephone Encounter (Signed)
Patient informed of message below, verbalized understanding.  

## 2017-01-25 NOTE — Telephone Encounter (Signed)
Called patient regarding message below. No answer, unable to leave message. Phone rang, there was a loud high pitched noise then static. Need to inform patient of message below.

## 2017-01-25 NOTE — Telephone Encounter (Signed)
Please tell patient thank you for sending me the blood sugar readings.  I will need to know what his fasting morning sugars are and I need to know if he has taken these blood sugar readings 2 hours after eating or before eating.  I need this information in order to adjust his insulin dosing.  He may call back with these readings or he may bring his blood sugar log to his follow-up.  Please counsel him that blood sugar readings need to be either fasting in the morning, before meals, or 2 hours after eating.  In the log he should did note the value of the sugar and the relation in regards to meals.

## 2017-01-26 DIAGNOSIS — Z7901 Long term (current) use of anticoagulants: Secondary | ICD-10-CM | POA: Diagnosis not present

## 2017-01-26 DIAGNOSIS — Z87891 Personal history of nicotine dependence: Secondary | ICD-10-CM | POA: Diagnosis not present

## 2017-01-26 DIAGNOSIS — Z794 Long term (current) use of insulin: Secondary | ICD-10-CM | POA: Diagnosis not present

## 2017-01-26 DIAGNOSIS — E119 Type 2 diabetes mellitus without complications: Secondary | ICD-10-CM | POA: Diagnosis not present

## 2017-01-26 DIAGNOSIS — I110 Hypertensive heart disease with heart failure: Secondary | ICD-10-CM | POA: Diagnosis not present

## 2017-01-26 DIAGNOSIS — J449 Chronic obstructive pulmonary disease, unspecified: Secondary | ICD-10-CM | POA: Diagnosis not present

## 2017-01-26 DIAGNOSIS — Z7951 Long term (current) use of inhaled steroids: Secondary | ICD-10-CM | POA: Diagnosis not present

## 2017-01-26 DIAGNOSIS — Z7982 Long term (current) use of aspirin: Secondary | ICD-10-CM | POA: Diagnosis not present

## 2017-01-26 DIAGNOSIS — Z79899 Other long term (current) drug therapy: Secondary | ICD-10-CM | POA: Diagnosis not present

## 2017-01-26 DIAGNOSIS — R05 Cough: Secondary | ICD-10-CM | POA: Diagnosis not present

## 2017-01-26 DIAGNOSIS — I509 Heart failure, unspecified: Secondary | ICD-10-CM | POA: Diagnosis not present

## 2017-01-26 DIAGNOSIS — Z85118 Personal history of other malignant neoplasm of bronchus and lung: Secondary | ICD-10-CM | POA: Diagnosis not present

## 2017-01-29 ENCOUNTER — Ambulatory Visit: Payer: Medicare Other | Admitting: Family Medicine

## 2017-01-29 DIAGNOSIS — C3411 Malignant neoplasm of upper lobe, right bronchus or lung: Secondary | ICD-10-CM | POA: Diagnosis not present

## 2017-01-29 DIAGNOSIS — I6782 Cerebral ischemia: Secondary | ICD-10-CM | POA: Diagnosis not present

## 2017-01-29 DIAGNOSIS — C3490 Malignant neoplasm of unspecified part of unspecified bronchus or lung: Secondary | ICD-10-CM | POA: Diagnosis not present

## 2017-01-30 DIAGNOSIS — J9611 Chronic respiratory failure with hypoxia: Secondary | ICD-10-CM | POA: Diagnosis not present

## 2017-01-30 DIAGNOSIS — J449 Chronic obstructive pulmonary disease, unspecified: Secondary | ICD-10-CM | POA: Diagnosis not present

## 2017-01-30 DIAGNOSIS — I482 Chronic atrial fibrillation: Secondary | ICD-10-CM | POA: Diagnosis not present

## 2017-01-30 DIAGNOSIS — R0789 Other chest pain: Secondary | ICD-10-CM | POA: Diagnosis not present

## 2017-01-31 DIAGNOSIS — R29898 Other symptoms and signs involving the musculoskeletal system: Secondary | ICD-10-CM | POA: Diagnosis not present

## 2017-01-31 DIAGNOSIS — Z510 Encounter for antineoplastic radiation therapy: Secondary | ICD-10-CM | POA: Diagnosis not present

## 2017-01-31 DIAGNOSIS — C3411 Malignant neoplasm of upper lobe, right bronchus or lung: Secondary | ICD-10-CM | POA: Diagnosis not present

## 2017-01-31 DIAGNOSIS — I509 Heart failure, unspecified: Secondary | ICD-10-CM | POA: Diagnosis not present

## 2017-02-01 DIAGNOSIS — J449 Chronic obstructive pulmonary disease, unspecified: Secondary | ICD-10-CM | POA: Diagnosis not present

## 2017-02-11 NOTE — Progress Notes (Signed)
Cardiology Office Note  Date: 02/12/2017   ID: Chase Herrera, Chase Herrera [birth year], MRN [id number]  PCP: [name], MD  Primary Cardiologist: [name], MD   Chief Complaint  Patient presents with  . Atrial Flutter    History of Present Illness: KYZER BLOWE is a 67 y.o. male last seen in November 2018. He had an office visit with Dr. [name] in December 2018 - I reviewed the note. Dr. [name] did not feel like AV node ablation with pacemaker was a good option given increased concerns about device infection, and ultimately scheduled him to be seen in the EP department at [facility] with Dr. Willis Modena to discuss other treatment options.  He presents today saying that he does feel somewhat better, has not been rehospitalized since I saw him. Heart rate control is better in atrial flutter with combination of Cardizem CD, Lanoxin, and metoprolol. He also continues on Xarelto for stroke prophylaxis.  Echocardiogram from October 2018 revealed LVEF 60-65% range.  Past Medical History:  Diagnosis Date  . Alcohol abuse    Quit 08/23/11  . Arthritis   . Atrial fibrillation ([place])   . Atrial flutter ([place])    CTI ablation by Dr [name] 10/2011  . COPD (chronic obstructive pulmonary disease) ([place])   . Depression   . Essential hypertension   . History of cardiomyopathy    LVEF 25-30% 08/2011 with subsequent normalization  . Obesity   . Prostate enlargement   . Small cell lung cancer ([place])    Right upper lobe - follows with [facility]  . Type 2 diabetes mellitus ([place])     Past Surgical History:  Procedure Laterality Date  . APPENDECTOMY    . ATRIAL ABLATION SURGERY  11/06/2011   CTI ablation for atrial flutter by Dr [name]  . ATRIAL FLUTTER ABLATION N/A 11/06/2011   Procedure: ATRIAL FLUTTER ABLATION;  Surgeon: Thompson Grayer, MD;  Location: [facility] CATH LAB;  Service: Cardiovascular;  Laterality: N/A;  . Cataracts    . IR GENERIC HISTORICAL  04/09/2016   IR US GUIDE VASC ACCESS RIGHT  04/09/2016 [name], DO [facility]INTER[facility]  . IR GENERIC HISTORICAL  04/09/2016   IR FLUORO GUIDE PORT INSERTION RIGHT 04/09/2016 [name], DO [facility]  . TONSILLECTOMY      Current Outpatient Medications  Medication Sig Dispense Refill  . acetaminophen (TYLENOL) 325 MG tablet Take every 6 (six) hours as needed by mouth.    [place] Kitchen albuterol (PROVENTIL HFA;VENTOLIN HFA) 108 (90 BASE) MCG/ACT inhaler Inhale 2 puffs into the lungs every 6 (six) hours as needed. For shortness of breath    . aspirin EC 81 MG tablet Take 81 mg by mouth daily.    Mar[place] Kitchen atorvastatin (LIPITOR) 20 MG tablet Take 1 tablet (20 mg total) by mouth daily. 90 tablet 3  . beclomethasone (QVAR) 80 MCG/ACT inhaler Inhale 1 puff daily into the lungs.    . digoxin (LANOXIN) 0.125 MG tablet Take 1 tablet (125 mcg total) by mouth daily. 90 tablet 1  . diltiazem (TIAZAC) 360 MG 24 hr capsule Take 1 capsule (360 mg total) by mouth daily. 90 capsule 1  . furosemide (LASIX) 40 MG tablet TAKE 1 TABLET BY MOUTH EVERY DAY 90 tablet 0  . gabapentin (NEURONTIN) 400 MG capsule Take 1 capsule (400 mg total) by mouth 3 (three) times daily. 180 capsule 3  . guaiFENesin (MUCINEX) 600 MG 12 hr tablet Take 1,200 mg daily by mouth.    [place] Kitchen HYDROcodone-acetaminophen (  NORCO) 10-325 MG tablet Take 1 tablet by mouth every 8 (eight) hours as needed for moderate pain. 40 tablet 0  . insulin glargine (LANTUS) 100 UNIT/ML injection Inject 25 Units into the skin 2 (two) times daily.    [place] Kitchen levalbuterol (XOPENEX) 0.63 MG/3ML nebulizer solution Inhale 3 mLs (0.63 mg total) into the lungs 4 (four) times daily. 3 mL 0  . metFORMIN (GLUCOPHAGE) 500 MG tablet Take 3 tablets (1,500 mg total) by mouth daily with breakfast. 270 tablet 1  . metoprolol tartrate (LOPRESSOR) 50 MG tablet Take 1 tablet (50 mg total) by mouth 2 (two) times daily. 180 tablet 3  . Multiple Vitamin (MULTIVITAMIN) capsule Take by mouth.    . Rivaroxaban (XARELTO) 15 MG TABS tablet Take 1 tablet (15 mg  total) by mouth daily. 90 tablet 1  . SYMBICORT 160-4.5 MCG/ACT inhaler Inhale 2 puffs 2 (two) times daily into the lungs.  1  . tamsulosin (FLOMAX) 0.4 MG CAPS capsule Take 1 capsule (0.4 mg total) by mouth 2 (two) times daily after a meal. 180 capsule 1   No current facility-administered medications for this visit.    Allergies:  Patient has no known allergies.   Social History: The patient  reports that he has quit smoking. His smoking use included cigarettes. He has a 23.50 pack-year smoking history. He quit smokeless tobacco use about 2 years ago. He reports that he does not drink alcohol or use drugs.   ROS:  Please see the history of present illness. Otherwise, complete review of systems is positive for chronic dyspnea on exertion with COPD, uses supplemental oxygen.  All other systems are reviewed and negative.   Physical Exam: VS:  BP 102/60   Pulse 94   Ht 5\' 11"  (1.803 m)   Wt 258 lb (117 kg)   SpO2 93%   BMI 35.98 kg/m , BMI Body mass index is 35.98 kg/m.  Wt Readings from Last 3 Encounters:  02/12/17 258 lb (117 kg)  12/28/16 270 lb (122.5 kg)  12/26/16 277 lb (125.6 kg)    General: Chronically ill-appearing male, not using wheelchair today. Wearing oxygen via nasal cannula. HEENT: Conjunctiva and lids normal, oropharynx clear with poor dentition. Neck: Supple, no elevated JVP or carotid bruits, no thyromegaly. Lungs: Diminished breath sounds without active wheezing, nonlabored breathing at rest. Cardiac: Distant irregular heart sounds, no S3 or significant systolic murmur. Abdomen: Soft, nontender, bowel sounds present. Extremities: Chronic appearing lower leg edema, distal pulses 1-2+. Skin: Warm and dry. Musculoskeletal: No kyphosis. Neuropsychiatric: Alert and oriented x3, affect grossly appropriate.  ECG: I personally reviewed the tracing from 12/28/2016 which showed persistent atrial flutter with 2:1 block.  Recent Labwork: 04/18/2016: B Natriuretic Peptide  163.0 12/02/2016: Hemoglobin 9.9; Magnesium 1.8; Platelets 129 12/26/2016: ALT 23; AST 21; BUN 17; Creat 1.15; Potassium 4.0; Sodium 142     Component Value Date/Time   CHOL 135 12/26/2016 1107   TRIG 125 12/26/2016 1107   HDL 42 12/26/2016 1107   CHOLHDL 3.2 12/26/2016 1107    Other Studies Reviewed Today:  Echocardiogram 11/15/2016: Study Conclusions  - Left ventricle: The cavity size was normal. Wall thickness was   normal. Systolic function was normal. The estimated ejection   fraction was in the range of 60% to 65%. Left ventricular   diastolic function parameters were normal. - Aortic valve: Valve area (VTI): 3.65 cm^2. Valve area (Vmax):   3.42 cm^2. Valve area (Vmean): 3.4 cm^2.  Assessment and Plan:  1. Persistent atypical  atrial flutter. Heart rate control is better on current regimen including Cardizem CD, Lopressor, and digoxin. Antiarrhythmic therapy treatment options are limited as discussed previously, and patient was not felt to be a good candidate for AV node ablation and pacing by Dr. [name] based on his most recent assessment. He has pending EP evaluation at [facility] in early February. Continue Xarelto for stroke prophylaxis. We will discuss follow-up lab work at his next visit.  2. Nonischemic cardiomyopathy with normalization of LVEF by most recent echocardiogram in the range of 60-65%.  3. Severe, oxygen-dependent COPD with chronic hypoxic respiratory failure. This also complicates management of his atrial arrhythmias.  4. Small cell lung cancer, continues to follow with oncology.  Current medicines were reviewed with the patient today.  Disposition: Follow-up in 3 months.  Signed, Satira Sark, MD, [facility] 02/12/2017 1:33 PM    [facility] [address], [place], [place] [address] Phone: [phone]; Fax: [phone]

## 2017-02-12 ENCOUNTER — Ambulatory Visit (INDEPENDENT_AMBULATORY_CARE_PROVIDER_SITE_OTHER): Payer: Medicare Other | Admitting: Cardiology

## 2017-02-12 ENCOUNTER — Encounter: Payer: Self-pay | Admitting: Cardiology

## 2017-02-12 VITALS — BP 102/60 | HR 94 | Ht 71.0 in | Wt 258.0 lb

## 2017-02-12 DIAGNOSIS — I484 Atypical atrial flutter: Secondary | ICD-10-CM

## 2017-02-12 DIAGNOSIS — J449 Chronic obstructive pulmonary disease, unspecified: Secondary | ICD-10-CM

## 2017-02-12 DIAGNOSIS — Z85118 Personal history of other malignant neoplasm of bronchus and lung: Secondary | ICD-10-CM

## 2017-02-12 DIAGNOSIS — I428 Other cardiomyopathies: Secondary | ICD-10-CM | POA: Diagnosis not present

## 2017-02-12 NOTE — Patient Instructions (Signed)
Medication Instructions:   Your physician recommends that you continue on your current medications as directed. Please refer to the Current Medication list given to you today.  Labwork:  NONE  Testing/Procedures:  NONE  Follow-Up:  Your physician recommends that you schedule a follow-up appointment in: 3 months.  Any Other Special Instructions Will Be Listed Below (If Applicable).  If you need a refill on your cardiac medications before your next appointment, please call your pharmacy. 

## 2017-02-13 DIAGNOSIS — J449 Chronic obstructive pulmonary disease, unspecified: Secondary | ICD-10-CM | POA: Diagnosis not present

## 2017-02-14 DIAGNOSIS — R530 Neoplastic (malignant) related fatigue: Secondary | ICD-10-CM | POA: Diagnosis not present

## 2017-02-14 DIAGNOSIS — R262 Difficulty in walking, not elsewhere classified: Secondary | ICD-10-CM | POA: Diagnosis not present

## 2017-02-14 DIAGNOSIS — J449 Chronic obstructive pulmonary disease, unspecified: Secondary | ICD-10-CM | POA: Diagnosis not present

## 2017-02-14 DIAGNOSIS — I4891 Unspecified atrial fibrillation: Secondary | ICD-10-CM | POA: Diagnosis not present

## 2017-02-14 DIAGNOSIS — M6281 Muscle weakness (generalized): Secondary | ICD-10-CM | POA: Diagnosis not present

## 2017-02-14 DIAGNOSIS — C3490 Malignant neoplasm of unspecified part of unspecified bronchus or lung: Secondary | ICD-10-CM | POA: Diagnosis not present

## 2017-02-14 DIAGNOSIS — R29898 Other symptoms and signs involving the musculoskeletal system: Secondary | ICD-10-CM | POA: Diagnosis not present

## 2017-02-19 DIAGNOSIS — C3490 Malignant neoplasm of unspecified part of unspecified bronchus or lung: Secondary | ICD-10-CM | POA: Diagnosis not present

## 2017-02-19 DIAGNOSIS — R29898 Other symptoms and signs involving the musculoskeletal system: Secondary | ICD-10-CM | POA: Diagnosis not present

## 2017-02-19 DIAGNOSIS — I4891 Unspecified atrial fibrillation: Secondary | ICD-10-CM | POA: Diagnosis not present

## 2017-02-19 DIAGNOSIS — R530 Neoplastic (malignant) related fatigue: Secondary | ICD-10-CM | POA: Diagnosis not present

## 2017-02-19 DIAGNOSIS — M6281 Muscle weakness (generalized): Secondary | ICD-10-CM | POA: Diagnosis not present

## 2017-02-19 DIAGNOSIS — J449 Chronic obstructive pulmonary disease, unspecified: Secondary | ICD-10-CM | POA: Diagnosis not present

## 2017-02-19 DIAGNOSIS — R262 Difficulty in walking, not elsewhere classified: Secondary | ICD-10-CM | POA: Diagnosis not present

## 2017-02-22 DIAGNOSIS — I4891 Unspecified atrial fibrillation: Secondary | ICD-10-CM | POA: Diagnosis not present

## 2017-02-22 DIAGNOSIS — C3490 Malignant neoplasm of unspecified part of unspecified bronchus or lung: Secondary | ICD-10-CM | POA: Diagnosis not present

## 2017-02-22 DIAGNOSIS — R262 Difficulty in walking, not elsewhere classified: Secondary | ICD-10-CM | POA: Diagnosis not present

## 2017-02-22 DIAGNOSIS — R29898 Other symptoms and signs involving the musculoskeletal system: Secondary | ICD-10-CM | POA: Diagnosis not present

## 2017-02-22 DIAGNOSIS — R530 Neoplastic (malignant) related fatigue: Secondary | ICD-10-CM | POA: Diagnosis not present

## 2017-02-22 DIAGNOSIS — M6281 Muscle weakness (generalized): Secondary | ICD-10-CM | POA: Diagnosis not present

## 2017-02-22 DIAGNOSIS — J449 Chronic obstructive pulmonary disease, unspecified: Secondary | ICD-10-CM | POA: Diagnosis not present

## 2017-02-26 DIAGNOSIS — I480 Paroxysmal atrial fibrillation: Secondary | ICD-10-CM | POA: Diagnosis not present

## 2017-02-26 DIAGNOSIS — I429 Cardiomyopathy, unspecified: Secondary | ICD-10-CM | POA: Diagnosis not present

## 2017-02-26 DIAGNOSIS — Z885 Allergy status to narcotic agent status: Secondary | ICD-10-CM | POA: Diagnosis not present

## 2017-02-26 DIAGNOSIS — I4892 Unspecified atrial flutter: Secondary | ICD-10-CM | POA: Diagnosis not present

## 2017-02-26 DIAGNOSIS — Z9981 Dependence on supplemental oxygen: Secondary | ICD-10-CM | POA: Diagnosis not present

## 2017-02-26 DIAGNOSIS — I10 Essential (primary) hypertension: Secondary | ICD-10-CM | POA: Diagnosis not present

## 2017-02-26 DIAGNOSIS — Z85118 Personal history of other malignant neoplasm of bronchus and lung: Secondary | ICD-10-CM | POA: Diagnosis not present

## 2017-02-26 DIAGNOSIS — G479 Sleep disorder, unspecified: Secondary | ICD-10-CM | POA: Diagnosis not present

## 2017-02-26 DIAGNOSIS — Z923 Personal history of irradiation: Secondary | ICD-10-CM | POA: Diagnosis not present

## 2017-02-26 DIAGNOSIS — Z7952 Long term (current) use of systemic steroids: Secondary | ICD-10-CM | POA: Diagnosis not present

## 2017-02-26 DIAGNOSIS — Z7983 Long term (current) use of bisphosphonates: Secondary | ICD-10-CM | POA: Diagnosis not present

## 2017-02-26 DIAGNOSIS — Z79899 Other long term (current) drug therapy: Secondary | ICD-10-CM | POA: Diagnosis not present

## 2017-02-26 DIAGNOSIS — E119 Type 2 diabetes mellitus without complications: Secondary | ICD-10-CM | POA: Diagnosis not present

## 2017-02-26 DIAGNOSIS — Z7982 Long term (current) use of aspirin: Secondary | ICD-10-CM | POA: Diagnosis not present

## 2017-02-26 DIAGNOSIS — Z7901 Long term (current) use of anticoagulants: Secondary | ICD-10-CM | POA: Diagnosis not present

## 2017-02-26 DIAGNOSIS — R7881 Bacteremia: Secondary | ICD-10-CM | POA: Diagnosis not present

## 2017-02-26 DIAGNOSIS — Z87891 Personal history of nicotine dependence: Secondary | ICD-10-CM | POA: Diagnosis not present

## 2017-02-26 DIAGNOSIS — J449 Chronic obstructive pulmonary disease, unspecified: Secondary | ICD-10-CM | POA: Diagnosis not present

## 2017-02-26 DIAGNOSIS — Z7951 Long term (current) use of inhaled steroids: Secondary | ICD-10-CM | POA: Diagnosis not present

## 2017-02-26 DIAGNOSIS — Z7984 Long term (current) use of oral hypoglycemic drugs: Secondary | ICD-10-CM | POA: Diagnosis not present

## 2017-02-26 DIAGNOSIS — Z9221 Personal history of antineoplastic chemotherapy: Secondary | ICD-10-CM | POA: Diagnosis not present

## 2017-02-26 DIAGNOSIS — I4891 Unspecified atrial fibrillation: Secondary | ICD-10-CM | POA: Diagnosis not present

## 2017-02-27 ENCOUNTER — Other Ambulatory Visit: Payer: Self-pay | Admitting: Family Medicine

## 2017-02-27 NOTE — Telephone Encounter (Signed)
Seen 12 13 18

## 2017-02-28 DIAGNOSIS — C3490 Malignant neoplasm of unspecified part of unspecified bronchus or lung: Secondary | ICD-10-CM | POA: Diagnosis not present

## 2017-02-28 DIAGNOSIS — M6281 Muscle weakness (generalized): Secondary | ICD-10-CM | POA: Diagnosis not present

## 2017-02-28 DIAGNOSIS — I4891 Unspecified atrial fibrillation: Secondary | ICD-10-CM | POA: Diagnosis not present

## 2017-02-28 DIAGNOSIS — R29898 Other symptoms and signs involving the musculoskeletal system: Secondary | ICD-10-CM | POA: Diagnosis not present

## 2017-02-28 DIAGNOSIS — R262 Difficulty in walking, not elsewhere classified: Secondary | ICD-10-CM | POA: Diagnosis not present

## 2017-02-28 DIAGNOSIS — J449 Chronic obstructive pulmonary disease, unspecified: Secondary | ICD-10-CM | POA: Diagnosis not present

## 2017-02-28 DIAGNOSIS — R530 Neoplastic (malignant) related fatigue: Secondary | ICD-10-CM | POA: Diagnosis not present

## 2017-03-01 DIAGNOSIS — R262 Difficulty in walking, not elsewhere classified: Secondary | ICD-10-CM | POA: Diagnosis not present

## 2017-03-01 DIAGNOSIS — I4891 Unspecified atrial fibrillation: Secondary | ICD-10-CM | POA: Diagnosis not present

## 2017-03-01 DIAGNOSIS — J449 Chronic obstructive pulmonary disease, unspecified: Secondary | ICD-10-CM | POA: Diagnosis not present

## 2017-03-01 DIAGNOSIS — R29898 Other symptoms and signs involving the musculoskeletal system: Secondary | ICD-10-CM | POA: Diagnosis not present

## 2017-03-01 DIAGNOSIS — C3490 Malignant neoplasm of unspecified part of unspecified bronchus or lung: Secondary | ICD-10-CM | POA: Diagnosis not present

## 2017-03-01 DIAGNOSIS — R530 Neoplastic (malignant) related fatigue: Secondary | ICD-10-CM | POA: Diagnosis not present

## 2017-03-01 DIAGNOSIS — M6281 Muscle weakness (generalized): Secondary | ICD-10-CM | POA: Diagnosis not present

## 2017-03-03 DIAGNOSIS — I509 Heart failure, unspecified: Secondary | ICD-10-CM | POA: Diagnosis not present

## 2017-03-05 DIAGNOSIS — R530 Neoplastic (malignant) related fatigue: Secondary | ICD-10-CM | POA: Diagnosis not present

## 2017-03-05 DIAGNOSIS — R29898 Other symptoms and signs involving the musculoskeletal system: Secondary | ICD-10-CM | POA: Diagnosis not present

## 2017-03-05 DIAGNOSIS — M6281 Muscle weakness (generalized): Secondary | ICD-10-CM | POA: Diagnosis not present

## 2017-03-05 DIAGNOSIS — C3490 Malignant neoplasm of unspecified part of unspecified bronchus or lung: Secondary | ICD-10-CM | POA: Diagnosis not present

## 2017-03-05 DIAGNOSIS — J449 Chronic obstructive pulmonary disease, unspecified: Secondary | ICD-10-CM | POA: Diagnosis not present

## 2017-03-05 DIAGNOSIS — I4891 Unspecified atrial fibrillation: Secondary | ICD-10-CM | POA: Diagnosis not present

## 2017-03-05 DIAGNOSIS — R262 Difficulty in walking, not elsewhere classified: Secondary | ICD-10-CM | POA: Diagnosis not present

## 2017-03-06 ENCOUNTER — Other Ambulatory Visit: Payer: Self-pay

## 2017-03-06 ENCOUNTER — Encounter: Payer: Self-pay | Admitting: Family Medicine

## 2017-03-06 ENCOUNTER — Ambulatory Visit (INDEPENDENT_AMBULATORY_CARE_PROVIDER_SITE_OTHER): Payer: Medicare Other | Admitting: Family Medicine

## 2017-03-06 VITALS — BP 118/60 | HR 90 | Temp 97.0°F | Resp 17 | Ht 71.0 in

## 2017-03-06 DIAGNOSIS — J449 Chronic obstructive pulmonary disease, unspecified: Secondary | ICD-10-CM

## 2017-03-06 DIAGNOSIS — E1169 Type 2 diabetes mellitus with other specified complication: Secondary | ICD-10-CM | POA: Diagnosis not present

## 2017-03-06 DIAGNOSIS — E1142 Type 2 diabetes mellitus with diabetic polyneuropathy: Secondary | ICD-10-CM

## 2017-03-06 DIAGNOSIS — I10 Essential (primary) hypertension: Secondary | ICD-10-CM | POA: Diagnosis not present

## 2017-03-06 DIAGNOSIS — E669 Obesity, unspecified: Secondary | ICD-10-CM | POA: Diagnosis not present

## 2017-03-06 DIAGNOSIS — J309 Allergic rhinitis, unspecified: Secondary | ICD-10-CM

## 2017-03-06 DIAGNOSIS — Z1211 Encounter for screening for malignant neoplasm of colon: Secondary | ICD-10-CM | POA: Diagnosis not present

## 2017-03-06 DIAGNOSIS — Z23 Encounter for immunization: Secondary | ICD-10-CM | POA: Diagnosis not present

## 2017-03-06 MED ORDER — TIOTROPIUM BROMIDE MONOHYDRATE 18 MCG IN CAPS: 18.0000 ug | ORAL_CAPSULE | Freq: Every day | RESPIRATORY_TRACT | 12 refills | Status: DC

## 2017-03-06 MED ORDER — FLUTICASONE PROPIONATE 50 MCG/ACT NA SUSP: 2.0000 | Freq: Every day | NASAL | 6 refills | Status: AC

## 2017-03-06 NOTE — Progress Notes (Signed)
Patient ID: Chase Herrera, male    DOB: 06/18/1950, 67 y.o.   MRN: [id number]  Chief Complaint  Patient presents with  . Follow-up    Allergies Patient has no known allergies.  Subjective:   Chase Herrera is a 67 y.o. male who presents to [facility] today.  HPI Chase Herrera is here for follow-up visit.  He was recently seen at [facility] by cardiology.  At that time his calcium channel blocker was increased.  His aspirin was discontinued.  His Xarelto was increased.  He reports that his heart has been in good rhythm.  He denies any palpitations or chest pain.    Reports that he is been doing pretty well with his COPD.  Review of his medicine reveals that he is on 2 different inhaled corticosteroids.  He reports that his previous PCP had him on Qvar and Symbicort.  He used to be on Spiriva years ago but has not been on this medication in quite some time.  He does report still baseline shortness of breath.  Gets winded easily.    Would like a refill on Flonase.  Right nostril gets swollen and irritated. Was placed on flonase and it helped him a lot.   Doing PT at [facility], 2 times a week for a total of 12 weeks. Has been doing it for 1.5 weeks. Can tell that he is getting stronger. Seen by Dr. Raliegh Ip in [place] for his radiation therapy.  Reports that his mood is good.  Has enough food at home.  Drives.  Has contacts with friends.    Denies any pain in his abdomen.  Bowel movements without problem.  Reports that he does have a history of precancerous polyps and has had to have yearly colonoscopies performed.  Reports that he is due for colonoscopy at this time.  Has previously received them at the [facility] clinic in [place].  Is interested in establishing care with a GI doctor here in the area.  Is due for foot exam today.  Reports that years ago he stepped on a stick in his foot and it caused him to have a large callus.  He reports from time to time he will use a razor blade and try to  cut off the callus.  He usually does pretty well but has cut his foot in the past.  Reports that his sugars are running well.  Denies any hypoglycemic episodes.  Has been compliant with his medication.  Does report that he occasionally has forgotten his evening dose of insulin.  Does check his blood sugars.  Sugars are less than 150.  Tries to eat a healthy diet per his report.  No lesions on his feet at this time.  Vision good.  Wears corrective lenses.    Past Medical History:  Diagnosis Date  . Alcohol abuse    Quit 08/23/11  . Arthritis   . Atrial fibrillation ([place])   . Atrial flutter ([place])    CTI ablation by Dr [name] 10/2011  . COPD (chronic obstructive pulmonary disease) ([place])   . Depression   . Essential hypertension   . History of cardiomyopathy    LVEF 25-30% 08/2011 with subsequent normalization  . Obesity   . Prostate enlargement   . Small cell lung cancer ([place])    Right upper lobe - follows with [facility]  . Type 2 diabetes mellitus ([place])     Past Surgical History:  Procedure Laterality Date  .  APPENDECTOMY    . ATRIAL ABLATION SURGERY  11/06/2011   CTI ablation for atrial flutter by Dr [name]  . ATRIAL FLUTTER ABLATION N/A 11/06/2011   Procedure: ATRIAL FLUTTER ABLATION;  Surgeon: Thompson Grayer, MD;  Location: [facility] C[facility] LAB;  Service: Cardiovascular;  Laterality: N/A;  . Cataracts    . IR GENERIC HISTORICAL  04/09/2016   IR US GUIDE VASC ACCESS RIGHT 04/09/2016 [name], DO [facility]-INTERV RAD  . IR GENERIC HISTORICAL  04/09/2016   IR FLUORO GUIDE PORT INSERTION RIGHT 04/09/2016 [name], DO [facility]INTER[facility]  . TONSILLECTOMY      Family History  Problem Relation Age of Onset  . Diabetes Sister   . Hypertension Mother   . Lung cancer Mother   . Hypertension Father   . Heart attack Father   . Hypertension Sister   . Hypertension Sister   . Hypertension Sister   . Pulmonary fibrosis Sister      Social History   Socioeconomic History  . Marital status:  Single    Spouse name: Not on file  . Number of children: Not on file  . Years of education: Not on file  . Highest education level: Not on file  Social Needs  . Financial resource strain: Somewhat hard  . Food insecurity - worry: Patient refused  . Food insecurity - inability: Patient refused  . Transportation needs - medical: Patient refused  . Transportation needs - non-medical: Patient refused  Occupational History  . Occupation: retired     Comment: Building services engineer  Tobacco Use  . Smoking status: Former Smoker    Packs/day: 0.50    Years: 47.00    Pack years: 23.50    Types: Cigarettes  . Smokeless tobacco: Former Systems developer    Quit date: 04/10/2014  . Tobacco comment: encouraged to quit today 03/04/12  Substance and Sexual Activity  . Alcohol use: No    Frequency: Never    Comment: quit 2 years ago  . Drug use: No    Comment: marajuana occasionally  . Sexual activity: Not Currently  Other Topics Concern  . Not on file  Social History Narrative   Lives in [place] alone.   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 acetaminophen (TYLENOL) 325 MG tablet Take every 6 (six) hours as needed by mouth.    Marland Kitchen atorvastatin (LIPITOR) 20 MG tablet Take 1 tablet (20 mg total) by mouth daily. 90 tablet 3  . beclomethasone (QVAR) 80 MCG/ACT inhaler Inhale 1 puff daily into the lungs.    . budesonide-formoterol (SYMBICORT) 160-4.5 MCG/ACT inhaler Inhale into the lungs.    . digoxin (LANOXIN) 0.125 MG tablet Take 1 tablet (125 mcg total) by mouth daily. 90 tablet 1  . diltiazem (CARDIZEM CD) 120 MG 24 hr capsule Take 120 mg by mouth daily.  6  . diltiazem (TIAZAC) 360 MG 24 hr capsule Take 1 capsule (360 mg total) by mouth daily. 90 capsule 1  . fluticasone (FLONASE) 50 MCG/ACT nasal spray INSTILL ONE SPRAY IN EACH NOSTRIL DAILY  0  . furosemide (LASIX) 40 MG tablet TAKE 1  TABLET BY MOUTH EVERY DAY 90 tablet 0  . gabapentin (NEURONTIN) 400 MG capsule Take 1 capsule (400 mg total) by mouth 3 (three) times daily. 180 capsule 3  . insulin glargine (LANTUS) 100 UNIT/ML injection Inject 25 Units into the skin  2 (two) times daily.    Marland Kitchen levalbuterol (XOPENEX) 0.63 MG/3ML nebulizer solution Inhale 3 mLs (0.63 mg total) into the lungs 4 (four) times daily. 3 mL 0  . metFORMIN (GLUCOPHAGE) 500 MG tablet Take 3 tablets (1,500 mg total) by mouth daily with breakfast. 270 tablet 1  . metoprolol tartrate (LOPRESSOR) 50 MG tablet Take 1 tablet (50 mg total) by mouth 2 (two) times daily. 180 tablet 3  . Multiple Vitamin (MULTIVITAMIN) capsule Take by mouth.    Marland Kitchen PROAIR HFA 108 (90 Base) MCG/ACT inhaler INHALE 2 PUFFS EVERY FOUR HOURS AS NEEDED FOR WHEEZING 18 g 1  . rivaroxaban (XARELTO) 20 MG TABS tablet Take 20 mg by mouth.    . tamsulosin (FLOMAX) 0.4 MG CAPS capsule Take 1 capsule (0.4 mg total) by mouth 2 (two) times daily after a meal. 180 capsule 1  . guaiFENesin (MUCINEX) 600 MG 12 hr tablet Take 1,200 mg daily by mouth.    Marland Kitchen HYDROcodone-acetaminophen (NORCO) 10-325 MG tablet Take 1 tablet by mouth every 8 (eight) hours as needed for moderate pain. (Patient not taking: Reported on 03/06/2017) 40 tablet 0   No current facility-administered medications on file prior to visit.     Review of Systems   Objective:   BP 118/60 (BP Location: Left Arm, Patient Position: Sitting, Cuff Size: Normal)   Pulse 90   Temp (!) 97 F (36.1 C) (Temporal)   Resp 17   Ht 5\' 11"  (1.803 m)   SpO2 96%   BMI 35.98 kg/m   Physical Exam  Constitutional: He is oriented to person, place, and time. He appears well-developed.  Winded with talking. Voice hoarse, but consistent with normal voice.   HENT:  Head: Normocephalic and atraumatic.  Dentition in poor repair.   Eyes: EOM are normal. Pupils are equal, round, and reactive to light.  Neck: Normal range of motion. Neck supple.    Cardiovascular: Normal rate, regular rhythm and normal heart sounds.  Pulses:      Dorsalis pedis pulses are 1+ on the right side, and 1+ on the left side.       Posterior tibial pulses are 1+ on the right side, and 1+ on the left side.  Musculoskeletal: Normal range of motion.       Right foot: There is normal range of motion and no deformity.       Left foot: There is normal range of motion and no deformity.  Feet:  Right Foot:  Protective Sensation: 7 sites tested. 4 sites sensed.  Skin Integrity: Negative for ulcer, blister or skin breakdown.  Left Foot:  Protective Sensation: 7 sites tested. 4 sites sensed.  Skin Integrity: Negative for ulcer, blister or skin breakdown.  Neurological: He is alert and oriented to person, place, and time.  Skin: Skin is warm and dry.  Diabetic foot exam done today.  Bilateral calluses at metatarsal region bilaterally.  Decreased sensation across metatarsal region, otherwise normal sensation..  1+ dorsalis pedis pulses bilaterally.  No edema in lower extremities bilaterally  Psychiatric: He has a normal mood and affect. His behavior is normal. Judgment and thought content normal.  Vitals reviewed.    Assessment and Plan  1. Allergic rhinitis, unspecified seasonality, unspecified trigger Refill Flonase at this time.  Counseled on use and proper administration. - fluticasone (FLONASE) 50 MCG/ACT nasal spray; Place 2 sprays into both nostrils daily.  Dispense: 16 g; Refill: 6  2. Diabetes mellitus type 2 in obese [facility]) Foot exam performed  today.  Patient does have evidence of callus bilaterally.  Callus worse at the metatarsal region.  Will refer to podiatry today for foot care and evaluation. - HM Diabetes Foot Exam  3. Essential hypertension Continue medication.  Blood pressure is well controlled.  4. Chronic obstructive pulmonary disease, unspecified COPD type ([place]) DC Qvar at this time.  Continue Symbicort as directed.  Add Spiriva at this  time.  Patient was counseled concerning worrisome signs and symptoms of worsening COPD/COPD exacerbation.  He was told to follow-up or call if he is experiencing any problems with his breathing. - tiotropium (SPIRIVA HANDIHALER) 18 MCG inhalation capsule; Place 1 capsule (18 mcg total) into inhaler and inhale daily.  Dispense: 30 capsule; Refill: 12  5. Screen for colon cancer Patient currently due for colonoscopy for screening secondary to his history of precancerous polyps.  We will try to request records from the [facility] regarding his previous colonoscopies and need for yearly screening. - Ambulatory referral to Gastroenterology  6. Diabetic peripheral neuropathy ([place]) Patient with decreased sensation in metatarsal region of his feet bilaterally.  He was counseled concerning foot care.  He does have calluses on his feet bilaterally which he tends to pare down with a razor blade from time to time.  He was discouraged from doing this. - Ambulatory referral to Podiatry  Patient will need hemoglobin A1c and labs at his next visit.  Hemoglobin A1c is well controlled. No Follow-up on file. [name], MD 03/06/2017

## 2017-03-08 DIAGNOSIS — R262 Difficulty in walking, not elsewhere classified: Secondary | ICD-10-CM | POA: Diagnosis not present

## 2017-03-08 DIAGNOSIS — R530 Neoplastic (malignant) related fatigue: Secondary | ICD-10-CM | POA: Diagnosis not present

## 2017-03-08 DIAGNOSIS — I4891 Unspecified atrial fibrillation: Secondary | ICD-10-CM | POA: Diagnosis not present

## 2017-03-08 DIAGNOSIS — R29898 Other symptoms and signs involving the musculoskeletal system: Secondary | ICD-10-CM | POA: Diagnosis not present

## 2017-03-08 DIAGNOSIS — M6281 Muscle weakness (generalized): Secondary | ICD-10-CM | POA: Diagnosis not present

## 2017-03-08 DIAGNOSIS — J449 Chronic obstructive pulmonary disease, unspecified: Secondary | ICD-10-CM | POA: Diagnosis not present

## 2017-03-08 DIAGNOSIS — C3490 Malignant neoplasm of unspecified part of unspecified bronchus or lung: Secondary | ICD-10-CM | POA: Diagnosis not present

## 2017-03-11 ENCOUNTER — Encounter: Payer: Self-pay | Admitting: Gastroenterology

## 2017-03-12 DIAGNOSIS — C3490 Malignant neoplasm of unspecified part of unspecified bronchus or lung: Secondary | ICD-10-CM | POA: Diagnosis not present

## 2017-03-12 DIAGNOSIS — M6281 Muscle weakness (generalized): Secondary | ICD-10-CM | POA: Diagnosis not present

## 2017-03-12 DIAGNOSIS — R262 Difficulty in walking, not elsewhere classified: Secondary | ICD-10-CM | POA: Diagnosis not present

## 2017-03-12 DIAGNOSIS — I4891 Unspecified atrial fibrillation: Secondary | ICD-10-CM | POA: Diagnosis not present

## 2017-03-12 DIAGNOSIS — R530 Neoplastic (malignant) related fatigue: Secondary | ICD-10-CM | POA: Diagnosis not present

## 2017-03-12 DIAGNOSIS — J449 Chronic obstructive pulmonary disease, unspecified: Secondary | ICD-10-CM | POA: Diagnosis not present

## 2017-03-12 DIAGNOSIS — R29898 Other symptoms and signs involving the musculoskeletal system: Secondary | ICD-10-CM | POA: Diagnosis not present

## 2017-03-13 DIAGNOSIS — J439 Emphysema, unspecified: Secondary | ICD-10-CM | POA: Diagnosis not present

## 2017-03-15 DIAGNOSIS — C3490 Malignant neoplasm of unspecified part of unspecified bronchus or lung: Secondary | ICD-10-CM | POA: Diagnosis not present

## 2017-03-15 DIAGNOSIS — J449 Chronic obstructive pulmonary disease, unspecified: Secondary | ICD-10-CM | POA: Diagnosis not present

## 2017-03-15 DIAGNOSIS — M6281 Muscle weakness (generalized): Secondary | ICD-10-CM | POA: Diagnosis not present

## 2017-03-15 DIAGNOSIS — R530 Neoplastic (malignant) related fatigue: Secondary | ICD-10-CM | POA: Diagnosis not present

## 2017-03-15 DIAGNOSIS — I4891 Unspecified atrial fibrillation: Secondary | ICD-10-CM | POA: Diagnosis not present

## 2017-03-15 DIAGNOSIS — R29898 Other symptoms and signs involving the musculoskeletal system: Secondary | ICD-10-CM | POA: Diagnosis not present

## 2017-03-15 DIAGNOSIS — R262 Difficulty in walking, not elsewhere classified: Secondary | ICD-10-CM | POA: Diagnosis not present

## 2017-03-16 DIAGNOSIS — J449 Chronic obstructive pulmonary disease, unspecified: Secondary | ICD-10-CM | POA: Diagnosis not present

## 2017-03-17 DIAGNOSIS — Z85118 Personal history of other malignant neoplasm of bronchus and lung: Secondary | ICD-10-CM | POA: Diagnosis not present

## 2017-03-17 DIAGNOSIS — Z7982 Long term (current) use of aspirin: Secondary | ICD-10-CM | POA: Diagnosis not present

## 2017-03-17 DIAGNOSIS — I482 Chronic atrial fibrillation: Secondary | ICD-10-CM | POA: Diagnosis not present

## 2017-03-17 DIAGNOSIS — R531 Weakness: Secondary | ICD-10-CM | POA: Diagnosis not present

## 2017-03-17 DIAGNOSIS — I4891 Unspecified atrial fibrillation: Secondary | ICD-10-CM | POA: Diagnosis not present

## 2017-03-17 DIAGNOSIS — E119 Type 2 diabetes mellitus without complications: Secondary | ICD-10-CM | POA: Diagnosis not present

## 2017-03-17 DIAGNOSIS — Z923 Personal history of irradiation: Secondary | ICD-10-CM | POA: Diagnosis not present

## 2017-03-17 DIAGNOSIS — Z79899 Other long term (current) drug therapy: Secondary | ICD-10-CM | POA: Diagnosis not present

## 2017-03-17 DIAGNOSIS — R0602 Shortness of breath: Secondary | ICD-10-CM | POA: Diagnosis not present

## 2017-03-17 DIAGNOSIS — I110 Hypertensive heart disease with heart failure: Secondary | ICD-10-CM | POA: Diagnosis not present

## 2017-03-17 DIAGNOSIS — Z87891 Personal history of nicotine dependence: Secondary | ICD-10-CM | POA: Diagnosis not present

## 2017-03-17 DIAGNOSIS — Z7951 Long term (current) use of inhaled steroids: Secondary | ICD-10-CM | POA: Diagnosis not present

## 2017-03-17 DIAGNOSIS — Z9221 Personal history of antineoplastic chemotherapy: Secondary | ICD-10-CM | POA: Diagnosis not present

## 2017-03-17 DIAGNOSIS — J441 Chronic obstructive pulmonary disease with (acute) exacerbation: Secondary | ICD-10-CM | POA: Diagnosis not present

## 2017-03-17 DIAGNOSIS — Z794 Long term (current) use of insulin: Secondary | ICD-10-CM | POA: Diagnosis not present

## 2017-03-17 DIAGNOSIS — Z7901 Long term (current) use of anticoagulants: Secondary | ICD-10-CM | POA: Diagnosis not present

## 2017-03-17 DIAGNOSIS — J449 Chronic obstructive pulmonary disease, unspecified: Secondary | ICD-10-CM | POA: Diagnosis not present

## 2017-03-17 DIAGNOSIS — I509 Heart failure, unspecified: Secondary | ICD-10-CM | POA: Diagnosis not present

## 2017-03-18 DIAGNOSIS — I110 Hypertensive heart disease with heart failure: Secondary | ICD-10-CM | POA: Diagnosis not present

## 2017-03-18 DIAGNOSIS — R0602 Shortness of breath: Secondary | ICD-10-CM | POA: Diagnosis not present

## 2017-03-18 DIAGNOSIS — Z79899 Other long term (current) drug therapy: Secondary | ICD-10-CM | POA: Diagnosis not present

## 2017-03-18 DIAGNOSIS — R531 Weakness: Secondary | ICD-10-CM | POA: Diagnosis not present

## 2017-03-18 DIAGNOSIS — Z7901 Long term (current) use of anticoagulants: Secondary | ICD-10-CM | POA: Diagnosis not present

## 2017-03-18 DIAGNOSIS — I509 Heart failure, unspecified: Secondary | ICD-10-CM | POA: Diagnosis not present

## 2017-03-18 DIAGNOSIS — I4891 Unspecified atrial fibrillation: Secondary | ICD-10-CM | POA: Diagnosis not present

## 2017-03-18 DIAGNOSIS — Z85118 Personal history of other malignant neoplasm of bronchus and lung: Secondary | ICD-10-CM | POA: Diagnosis not present

## 2017-03-18 DIAGNOSIS — I482 Chronic atrial fibrillation: Secondary | ICD-10-CM | POA: Diagnosis not present

## 2017-03-18 DIAGNOSIS — Z794 Long term (current) use of insulin: Secondary | ICD-10-CM | POA: Diagnosis not present

## 2017-03-18 DIAGNOSIS — Z87891 Personal history of nicotine dependence: Secondary | ICD-10-CM | POA: Diagnosis not present

## 2017-03-18 DIAGNOSIS — E119 Type 2 diabetes mellitus without complications: Secondary | ICD-10-CM | POA: Diagnosis not present

## 2017-03-18 DIAGNOSIS — J449 Chronic obstructive pulmonary disease, unspecified: Secondary | ICD-10-CM | POA: Diagnosis not present

## 2017-03-19 ENCOUNTER — Other Ambulatory Visit (HOSPITAL_COMMUNITY): Payer: Self-pay | Admitting: *Deleted

## 2017-03-19 DIAGNOSIS — M6281 Muscle weakness (generalized): Secondary | ICD-10-CM | POA: Diagnosis not present

## 2017-03-19 DIAGNOSIS — R29898 Other symptoms and signs involving the musculoskeletal system: Secondary | ICD-10-CM | POA: Diagnosis not present

## 2017-03-19 DIAGNOSIS — I4891 Unspecified atrial fibrillation: Secondary | ICD-10-CM | POA: Diagnosis not present

## 2017-03-19 DIAGNOSIS — J449 Chronic obstructive pulmonary disease, unspecified: Secondary | ICD-10-CM | POA: Diagnosis not present

## 2017-03-19 DIAGNOSIS — C3490 Malignant neoplasm of unspecified part of unspecified bronchus or lung: Secondary | ICD-10-CM | POA: Diagnosis not present

## 2017-03-19 DIAGNOSIS — R530 Neoplastic (malignant) related fatigue: Secondary | ICD-10-CM | POA: Diagnosis not present

## 2017-03-19 DIAGNOSIS — R262 Difficulty in walking, not elsewhere classified: Secondary | ICD-10-CM | POA: Diagnosis not present

## 2017-03-20 ENCOUNTER — Inpatient Hospital Stay (HOSPITAL_COMMUNITY): Payer: Medicare Other | Attending: Oncology

## 2017-03-20 DIAGNOSIS — C3490 Malignant neoplasm of unspecified part of unspecified bronchus or lung: Secondary | ICD-10-CM | POA: Diagnosis not present

## 2017-03-20 LAB — COMPREHENSIVE METABOLIC PANEL
ALT: 32 U/L (ref 17–63)
AST: 29 U/L (ref 15–41)
Albumin: 3.7 g/dL (ref 3.5–5.0)
Alkaline Phosphatase: 47 U/L (ref 38–126)
Anion gap: 13 (ref 5–15)
BUN: 20 mg/dL (ref 6–20)
CO2: 25 mmol/L (ref 22–32)
Calcium: 9.3 mg/dL (ref 8.9–10.3)
Chloride: 100 mmol/L — ABNORMAL LOW (ref 101–111)
Creatinine, Ser: 1.35 mg/dL — ABNORMAL HIGH (ref 0.61–1.24)
GFR calc Af Amer: >60 mL/min (ref >60)
GFR calc non Af Amer: 54 mL/min — ABNORMAL LOW (ref >60)
Glucose, Bld: 284 mg/dL — ABNORMAL HIGH (ref 65–99)
Potassium: 3.9 mmol/L (ref 3.5–5.1)
Sodium: 138 mmol/L (ref 135–145)
Total Bilirubin: 0.2 mg/dL — ABNORMAL LOW (ref 0.3–1.2)
Total Protein: 6.3 g/dL — ABNORMAL LOW (ref 6.5–8.1)

## 2017-03-20 LAB — CBC WITH DIFFERENTIAL/PLATELET
Basophils Absolute: 0 10*3/uL (ref 0.0–0.1)
Basophils Relative: 0 %
Eosinophils Absolute: 0.1 10*3/uL (ref 0.0–0.7)
Eosinophils Relative: 2 %
HCT: 38.2 % — ABNORMAL LOW (ref 39.0–52.0)
Hemoglobin: 12 g/dL — ABNORMAL LOW (ref 13.0–17.0)
Lymphocytes Relative: 14 %
Lymphs Abs: 1.2 10*3/uL (ref 0.7–4.0)
MCH: 32.3 pg (ref 26.0–34.0)
MCHC: 31.4 g/dL (ref 30.0–36.0)
MCV: 103 fL — ABNORMAL HIGH (ref 78.0–100.0)
Monocytes Absolute: 0.2 10*3/uL (ref 0.1–1.0)
Monocytes Relative: 3 %
Neutro Abs: 6.9 10*3/uL (ref 1.7–7.7)
Neutrophils Relative %: 81 %
Platelets: 181 10*3/uL (ref 150–400)
RBC: 3.71 MIL/uL — ABNORMAL LOW (ref 4.22–5.81)
RDW: 14.6 % (ref 11.5–15.5)
WBC: 8.4 10*3/uL (ref 4.0–10.5)

## 2017-03-22 DIAGNOSIS — E113213 Type 2 diabetes mellitus with mild nonproliferative diabetic retinopathy with macular edema, bilateral: Secondary | ICD-10-CM | POA: Diagnosis not present

## 2017-03-22 DIAGNOSIS — E11311 Type 2 diabetes mellitus with unspecified diabetic retinopathy with macular edema: Secondary | ICD-10-CM | POA: Diagnosis not present

## 2017-03-22 DIAGNOSIS — H26492 Other secondary cataract, left eye: Secondary | ICD-10-CM | POA: Diagnosis not present

## 2017-03-22 DIAGNOSIS — Z961 Presence of intraocular lens: Secondary | ICD-10-CM | POA: Diagnosis not present

## 2017-03-25 ENCOUNTER — Ambulatory Visit (HOSPITAL_COMMUNITY)
Admission: RE | Admit: 2017-03-25 | Discharge: 2017-03-25 | Disposition: A | Discharge status: Home / Self Care | Payer: Medicare Other | Source: Ambulatory Visit | Attending: Oncology | Admitting: Oncology

## 2017-03-25 DIAGNOSIS — I2510 Atherosclerotic heart disease of native coronary artery without angina pectoris: Secondary | ICD-10-CM | POA: Insufficient documentation

## 2017-03-25 DIAGNOSIS — I700 Atherosclerosis of aorta: Secondary | ICD-10-CM | POA: Diagnosis not present

## 2017-03-25 DIAGNOSIS — J439 Emphysema, unspecified: Secondary | ICD-10-CM | POA: Insufficient documentation

## 2017-03-25 DIAGNOSIS — C3490 Malignant neoplasm of unspecified part of unspecified bronchus or lung: Secondary | ICD-10-CM | POA: Diagnosis not present

## 2017-03-25 DIAGNOSIS — K760 Fatty (change of) liver, not elsewhere classified: Secondary | ICD-10-CM | POA: Diagnosis not present

## 2017-03-25 MED ORDER — IOPAMIDOL (ISOVUE-300) INJECTION 61%
100.0000 mL | Freq: Once | INTRAVENOUS | Status: AC | PRN
  Administered 2017-03-25: 100 mL via INTRAVENOUS

## 2017-03-26 DIAGNOSIS — C3490 Malignant neoplasm of unspecified part of unspecified bronchus or lung: Secondary | ICD-10-CM | POA: Diagnosis not present

## 2017-03-26 DIAGNOSIS — R530 Neoplastic (malignant) related fatigue: Secondary | ICD-10-CM | POA: Diagnosis not present

## 2017-03-26 DIAGNOSIS — R262 Difficulty in walking, not elsewhere classified: Secondary | ICD-10-CM | POA: Diagnosis not present

## 2017-03-26 DIAGNOSIS — M6281 Muscle weakness (generalized): Secondary | ICD-10-CM | POA: Diagnosis not present

## 2017-03-26 DIAGNOSIS — I4891 Unspecified atrial fibrillation: Secondary | ICD-10-CM | POA: Diagnosis not present

## 2017-03-26 DIAGNOSIS — J449 Chronic obstructive pulmonary disease, unspecified: Secondary | ICD-10-CM | POA: Diagnosis not present

## 2017-03-26 DIAGNOSIS — R29898 Other symptoms and signs involving the musculoskeletal system: Secondary | ICD-10-CM | POA: Diagnosis not present

## 2017-03-27 ENCOUNTER — Inpatient Hospital Stay (HOSPITAL_COMMUNITY): Payer: Medicare Other | Attending: Oncology | Admitting: Internal Medicine

## 2017-03-27 ENCOUNTER — Other Ambulatory Visit: Payer: Self-pay

## 2017-03-27 ENCOUNTER — Encounter (HOSPITAL_COMMUNITY): Payer: Self-pay | Admitting: Internal Medicine

## 2017-03-27 ENCOUNTER — Other Ambulatory Visit (HOSPITAL_COMMUNITY): Payer: Self-pay | Admitting: Internal Medicine

## 2017-03-27 DIAGNOSIS — Z85118 Personal history of other malignant neoplasm of bronchus and lung: Secondary | ICD-10-CM | POA: Diagnosis not present

## 2017-03-27 DIAGNOSIS — I509 Heart failure, unspecified: Secondary | ICD-10-CM | POA: Diagnosis not present

## 2017-03-27 DIAGNOSIS — Z87891 Personal history of nicotine dependence: Secondary | ICD-10-CM

## 2017-03-27 DIAGNOSIS — K760 Fatty (change of) liver, not elsewhere classified: Secondary | ICD-10-CM | POA: Insufficient documentation

## 2017-03-27 DIAGNOSIS — Z794 Long term (current) use of insulin: Secondary | ICD-10-CM | POA: Insufficient documentation

## 2017-03-27 DIAGNOSIS — I700 Atherosclerosis of aorta: Secondary | ICD-10-CM | POA: Diagnosis not present

## 2017-03-27 DIAGNOSIS — Z923 Personal history of irradiation: Secondary | ICD-10-CM | POA: Insufficient documentation

## 2017-03-27 DIAGNOSIS — I4891 Unspecified atrial fibrillation: Secondary | ICD-10-CM | POA: Insufficient documentation

## 2017-03-27 DIAGNOSIS — C3411 Malignant neoplasm of upper lobe, right bronchus or lung: Secondary | ICD-10-CM

## 2017-03-27 DIAGNOSIS — C3490 Malignant neoplasm of unspecified part of unspecified bronchus or lung: Secondary | ICD-10-CM

## 2017-03-27 DIAGNOSIS — Z79899 Other long term (current) drug therapy: Secondary | ICD-10-CM | POA: Diagnosis not present

## 2017-03-27 DIAGNOSIS — Z7901 Long term (current) use of anticoagulants: Secondary | ICD-10-CM | POA: Insufficient documentation

## 2017-03-27 DIAGNOSIS — E119 Type 2 diabetes mellitus without complications: Secondary | ICD-10-CM | POA: Diagnosis not present

## 2017-03-27 DIAGNOSIS — Z836 Family history of other diseases of the respiratory system: Secondary | ICD-10-CM | POA: Diagnosis not present

## 2017-03-27 DIAGNOSIS — I110 Hypertensive heart disease with heart failure: Secondary | ICD-10-CM | POA: Diagnosis not present

## 2017-03-27 DIAGNOSIS — M546 Pain in thoracic spine: Secondary | ICD-10-CM | POA: Diagnosis not present

## 2017-03-27 DIAGNOSIS — Z8249 Family history of ischemic heart disease and other diseases of the circulatory system: Secondary | ICD-10-CM | POA: Diagnosis not present

## 2017-03-27 DIAGNOSIS — J449 Chronic obstructive pulmonary disease, unspecified: Secondary | ICD-10-CM | POA: Diagnosis not present

## 2017-03-27 NOTE — Patient Instructions (Addendum)
[place] at [facility] Discharge Instructions   You were seen today by Dr. Zoila Shutter. Your recent scan looks good. We will get you another scan scheduled for 3 months from now. We will refer you to get your port removed.  Make sure you keep your appointment for your colonoscopy. Return in 3 months for scans and follow up.   Thank you for choosing [place] at [facility] to provide your oncology and hematology care.  To afford each patient quality time with our provider, please arrive at least 15 minutes before your scheduled appointment time.    If you have a lab appointment with the [facility] please come in thru the  Main Entrance and check in at the main information desk  You need to re-schedule your appointment should you arrive 10 or more minutes late.  We strive to give you quality time with our providers, and arriving late affects you and other patients whose appointments are after yours.  Also, if you no show three or more times for appointments you may be dismissed from the clinic at the providers discretion.     Again, thank you for choosing [facility].  Our hope is that these requests will decrease the amount of time that you wait before being seen by our physicians.       _____________________________________________________________  Should you have questions after your visit to [facility], please contact our office at [phone] between the hours of 8:30 a.m. and 4:30 p.m.  Voicemails left after 4:30 p.m. will not be returned until the following business day.  For prescription refill requests, have your pharmacy contact our office.       Resources For Cancer Patients and their Caregivers ? American Cancer Society: Can assist with transportation, wigs, general needs, runs Look Good Feel Better.        352-810-6883 ? Cancer Care: Provides financial assistance, online support groups,  medication/co-pay assistance.  1-800-813-HOPE (682)838-1300) ? [place] Assists [place] cancer patients and their families through emotional , educational and financial support.  346-706-8723 ? [place] DSS Where to apply for food stamps, Medicaid and utility assistance. 626 846 2335 ? [facility]: Transportation to medical appointments. (720)471-3602 ? Social Security Administration: May apply for disability if have a Stage IV cancer. 747-111-1129 404-242-9129 ? [employer], Disability and Transit Services: Assists with nutrition, care and transit needs. [place] Support Programs:   > Cancer Support Group  2nd Tuesday of the month 1pm-2pm, Journey Room   > Creative Journey  3rd Tuesday of the month 1130am-1pm, Journey Room

## 2017-03-28 DIAGNOSIS — L110 Acquired keratosis follicularis: Secondary | ICD-10-CM | POA: Diagnosis not present

## 2017-03-28 DIAGNOSIS — E1140 Type 2 diabetes mellitus with diabetic neuropathy, unspecified: Secondary | ICD-10-CM | POA: Diagnosis not present

## 2017-03-29 DIAGNOSIS — R530 Neoplastic (malignant) related fatigue: Secondary | ICD-10-CM | POA: Diagnosis not present

## 2017-03-29 DIAGNOSIS — I4891 Unspecified atrial fibrillation: Secondary | ICD-10-CM | POA: Diagnosis not present

## 2017-03-29 DIAGNOSIS — M6281 Muscle weakness (generalized): Secondary | ICD-10-CM | POA: Diagnosis not present

## 2017-03-29 DIAGNOSIS — R262 Difficulty in walking, not elsewhere classified: Secondary | ICD-10-CM | POA: Diagnosis not present

## 2017-03-29 DIAGNOSIS — R29898 Other symptoms and signs involving the musculoskeletal system: Secondary | ICD-10-CM | POA: Diagnosis not present

## 2017-03-29 DIAGNOSIS — C3490 Malignant neoplasm of unspecified part of unspecified bronchus or lung: Secondary | ICD-10-CM | POA: Diagnosis not present

## 2017-03-29 DIAGNOSIS — J449 Chronic obstructive pulmonary disease, unspecified: Secondary | ICD-10-CM | POA: Diagnosis not present

## 2017-03-31 DIAGNOSIS — I509 Heart failure, unspecified: Secondary | ICD-10-CM | POA: Diagnosis not present

## 2017-04-02 DIAGNOSIS — I4891 Unspecified atrial fibrillation: Secondary | ICD-10-CM | POA: Diagnosis not present

## 2017-04-02 DIAGNOSIS — R262 Difficulty in walking, not elsewhere classified: Secondary | ICD-10-CM | POA: Diagnosis not present

## 2017-04-02 DIAGNOSIS — C3490 Malignant neoplasm of unspecified part of unspecified bronchus or lung: Secondary | ICD-10-CM | POA: Diagnosis not present

## 2017-04-02 DIAGNOSIS — R29898 Other symptoms and signs involving the musculoskeletal system: Secondary | ICD-10-CM | POA: Diagnosis not present

## 2017-04-02 DIAGNOSIS — J449 Chronic obstructive pulmonary disease, unspecified: Secondary | ICD-10-CM | POA: Diagnosis not present

## 2017-04-02 DIAGNOSIS — M6281 Muscle weakness (generalized): Secondary | ICD-10-CM | POA: Diagnosis not present

## 2017-04-02 DIAGNOSIS — R530 Neoplastic (malignant) related fatigue: Secondary | ICD-10-CM | POA: Diagnosis not present

## 2017-04-04 DIAGNOSIS — J9611 Chronic respiratory failure with hypoxia: Secondary | ICD-10-CM | POA: Diagnosis not present

## 2017-04-04 DIAGNOSIS — I482 Chronic atrial fibrillation: Secondary | ICD-10-CM | POA: Diagnosis not present

## 2017-04-04 DIAGNOSIS — J449 Chronic obstructive pulmonary disease, unspecified: Secondary | ICD-10-CM | POA: Diagnosis not present

## 2017-04-04 DIAGNOSIS — Z85118 Personal history of other malignant neoplasm of bronchus and lung: Secondary | ICD-10-CM | POA: Diagnosis not present

## 2017-04-05 ENCOUNTER — Ambulatory Visit: Payer: Medicare Other | Admitting: Podiatry

## 2017-04-05 ENCOUNTER — Other Ambulatory Visit (HOSPITAL_BASED_OUTPATIENT_CLINIC_OR_DEPARTMENT_OTHER): Payer: Self-pay

## 2017-04-05 DIAGNOSIS — G478 Other sleep disorders: Secondary | ICD-10-CM

## 2017-04-05 DIAGNOSIS — J449 Chronic obstructive pulmonary disease, unspecified: Secondary | ICD-10-CM | POA: Diagnosis not present

## 2017-04-05 DIAGNOSIS — C3490 Malignant neoplasm of unspecified part of unspecified bronchus or lung: Secondary | ICD-10-CM | POA: Diagnosis not present

## 2017-04-05 DIAGNOSIS — I4891 Unspecified atrial fibrillation: Secondary | ICD-10-CM | POA: Diagnosis not present

## 2017-04-05 DIAGNOSIS — R29898 Other symptoms and signs involving the musculoskeletal system: Secondary | ICD-10-CM | POA: Diagnosis not present

## 2017-04-05 DIAGNOSIS — M6281 Muscle weakness (generalized): Secondary | ICD-10-CM | POA: Diagnosis not present

## 2017-04-05 DIAGNOSIS — R262 Difficulty in walking, not elsewhere classified: Secondary | ICD-10-CM | POA: Diagnosis not present

## 2017-04-05 DIAGNOSIS — R530 Neoplastic (malignant) related fatigue: Secondary | ICD-10-CM | POA: Diagnosis not present

## 2017-04-09 ENCOUNTER — Ambulatory Visit: Payer: Medicare Other | Admitting: Family Medicine

## 2017-04-09 DIAGNOSIS — R29898 Other symptoms and signs involving the musculoskeletal system: Secondary | ICD-10-CM | POA: Diagnosis not present

## 2017-04-09 DIAGNOSIS — R530 Neoplastic (malignant) related fatigue: Secondary | ICD-10-CM | POA: Diagnosis not present

## 2017-04-09 DIAGNOSIS — J449 Chronic obstructive pulmonary disease, unspecified: Secondary | ICD-10-CM | POA: Diagnosis not present

## 2017-04-09 DIAGNOSIS — C3490 Malignant neoplasm of unspecified part of unspecified bronchus or lung: Secondary | ICD-10-CM | POA: Diagnosis not present

## 2017-04-09 DIAGNOSIS — R262 Difficulty in walking, not elsewhere classified: Secondary | ICD-10-CM | POA: Diagnosis not present

## 2017-04-09 DIAGNOSIS — I4891 Unspecified atrial fibrillation: Secondary | ICD-10-CM | POA: Diagnosis not present

## 2017-04-09 DIAGNOSIS — M6281 Muscle weakness (generalized): Secondary | ICD-10-CM | POA: Diagnosis not present

## 2017-04-10 ENCOUNTER — Ambulatory Visit: Payer: Medicare Other | Attending: Pulmonary Disease | Admitting: Neurology

## 2017-04-10 DIAGNOSIS — G4736 Sleep related hypoventilation in conditions classified elsewhere: Secondary | ICD-10-CM | POA: Diagnosis not present

## 2017-04-10 DIAGNOSIS — G479 Sleep disorder, unspecified: Secondary | ICD-10-CM | POA: Diagnosis present

## 2017-04-10 DIAGNOSIS — G478 Other sleep disorders: Secondary | ICD-10-CM

## 2017-04-10 DIAGNOSIS — G259 Extrapyramidal and movement disorder, unspecified: Secondary | ICD-10-CM | POA: Insufficient documentation

## 2017-04-10 DIAGNOSIS — G4733 Obstructive sleep apnea (adult) (pediatric): Secondary | ICD-10-CM | POA: Diagnosis not present

## 2017-04-11 ENCOUNTER — Other Ambulatory Visit (HOSPITAL_BASED_OUTPATIENT_CLINIC_OR_DEPARTMENT_OTHER): Payer: Self-pay

## 2017-04-11 DIAGNOSIS — G478 Other sleep disorders: Secondary | ICD-10-CM

## 2017-04-13 DIAGNOSIS — J449 Chronic obstructive pulmonary disease, unspecified: Secondary | ICD-10-CM | POA: Diagnosis not present

## 2017-04-13 DIAGNOSIS — J439 Emphysema, unspecified: Secondary | ICD-10-CM | POA: Diagnosis not present

## 2017-04-13 NOTE — Procedures (Signed)
[place] A. Merlene Laughter, MD     [email or web address]             NOCTURNAL POLYSOMNOGRAPHY   LOCATION: [facility]   Patient Name: Chase Herrera, Chase Herrera Date: 04/10/2017 Gender: Male D.O.B: 01/08/1951 Age (years): 66 Referring Provider: Sinda Du Height (inches): 71 Interpreting Physician: Phillips Odor MD, ABSM Weight (lbs): 252 RPSGT: Peak, Robert BMI: 36 MRN: [id number] Neck Size: 16.00 CLINICAL INFORMATION Sleep Study Type: NPSG     Indication for sleep study: N/A     Epworth Sleepiness Score: 0     SLEEP STUDY TECHNIQUE As per the AASM Manual for the Scoring of Sleep and Associated Events v2.3 (April 2016) with a hypopnea requiring 4% desaturations.  The channels recorded and monitored were frontal, central and occipital EEG, electrooculogram (EOG), submentalis EMG (chin), nasal and oral airflow, thoracic and abdominal wall motion, anterior tibialis EMG, snore microphone, electrocardiogram, and pulse oximetry.  MEDICATIONS Medications self-administered by patient taken the night of the study : N/A  Current Outpatient Medications:  .  acetaminophen (TYLENOL) 325 MG tablet, Take every 6 (six) hours as needed by mouth., Disp: , Rfl:  .  atorvastatin (LIPITOR) 20 MG tablet, Take 1 tablet (20 mg total) by mouth daily., Disp: 90 tablet, Rfl: 3 .  beclomethasone (QVAR) 80 MCG/ACT inhaler, Inhale 1 puff daily into the lungs., Disp: , Rfl:  .  budesonide-formoterol (SYMBICORT) 160-4.5 MCG/ACT inhaler, Inhale into the lungs., Disp: , Rfl:  .  digoxin (LANOXIN) 0.125 MG tablet, Take 1 tablet (125 mcg total) by mouth daily., Disp: 90 tablet, Rfl: 1 .  diltiazem (CARDIZEM CD) 120 MG 24 hr capsule, Take 120 mg by mouth daily., Disp: , Rfl: 6 .  diltiazem (TIAZAC) 360 MG 24 hr capsule, Take 1 capsule (360 mg total) by mouth daily., Disp: 90 capsule, Rfl: 1 .  fluticasone (FLONASE) 50 MCG/ACT nasal spray, INSTILL ONE SPRAY IN EACH NOSTRIL DAILY, Disp: ,  Rfl: 0 .  fluticasone (FLONASE) 50 MCG/ACT nasal spray, Place 2 sprays into both nostrils daily., Disp: 16 g, Rfl: 6 .  furosemide (LASIX) 40 MG tablet, TAKE 1 TABLET BY MOUTH EVERY DAY, Disp: 90 tablet, Rfl: 0 .  gabapentin (NEURONTIN) 400 MG capsule, Take 1 capsule (400 mg total) by mouth 3 (three) times daily., Disp: 180 capsule, Rfl: 3 .  guaiFENesin (MUCINEX) 600 MG 12 hr tablet, Take 1,200 mg daily by mouth., Disp: , Rfl:  .  HYDROcodone-acetaminophen (NORCO) 10-325 MG tablet, Take 1 tablet by mouth every 8 (eight) hours as needed for moderate pain., Disp: 40 tablet, Rfl: 0 .  insulin glargine (LANTUS) 100 UNIT/ML injection, Inject 25 Units into the skin 2 (two) times daily., Disp: , Rfl:  .  levalbuterol (XOPENEX) 0.63 MG/3ML nebulizer solution, Inhale 3 mLs (0.63 mg total) into the lungs 4 (four) times daily., Disp: 3 mL, Rfl: 0 .  metFORMIN (GLUCOPHAGE) 500 MG tablet, Take 3 tablets (1,500 mg total) by mouth daily with breakfast., Disp: 270 tablet, Rfl: 1 .  metoprolol tartrate (LOPRESSOR) 50 MG tablet, Take 1 tablet (50 mg total) by mouth 2 (two) times daily., Disp: 180 tablet, Rfl: 3 .  Multiple Vitamins-Minerals (COMPLETE MULTIVITAMIN/MINERAL PO), TAKE 1 TABLET BY MOUTH DAILY, Disp: , Rfl: 99 .  predniSONE (DELTASONE) 20 MG tablet, TAKE TWO TABLETS BY MOUTH DAILY with food, Disp: , Rfl: 0 .  PROAIR HFA 108 (90 Base) MCG/ACT inhaler, INHALE 2 PUFFS EVERY FOUR HOURS AS NEEDED FOR WHEEZING, Disp: 18 g, Rfl: 1 .  rivaroxaban (XARELTO) 20 MG TABS tablet, Take 20 mg by mouth., Disp: , Rfl:  .  tamsulosin (FLOMAX) 0.4 MG CAPS capsule, Take 1 capsule (0.4 mg total) by mouth 2 (two) times daily after a meal., Disp: 180 capsule, Rfl: 1 .  tiotropium (SPIRIVA HANDIHALER) 18 MCG inhalation capsule, Place 1 capsule (18 mcg total) into inhaler and inhale daily., Disp: 30 capsule, Rfl: 12     SLEEP ARCHITECTURE The study was initiated at 10:09:14 PM and ended at 5:07:19 AM.  Sleep onset time was  3.9 minutes and the sleep efficiency was 75.6%%. The total sleep time was 316.0 minutes.  Stage REM latency was 51.5 minutes.  The patient spent 4.6%% of the night in stage N1 sleep, 70.7%% in stage N2 sleep, 0.0%% in stage N3 and 24.68% in REM.  Alpha intrusion was absent.  Supine sleep was 0.00%.  RESPIRATORY PARAMETERS The overall apnea/hypopnea index (AHI) was 0.0 per hour. There were 0 total apneas, including 0 obstructive, 0 central and 0 mixed apneas. There were 0 hypopneas and 21 RERAs.  The AHI during Stage REM sleep was 0.0 per hour.  AHI while supine was N/A per hour.  The minimum SpO2 during sleep was 85.0%. The total minutes with oxygen saturation less than 88% is 58.  There is prolonged episodes of desaturation without apneic events especially during REM sleep.  soft snoring was noted during this study.  CARDIAC DATA The 2 lead EKG demonstrated atrial fibrillation. The mean heart rate was N/A beats per minute. Other EKG findings include: None.   LEG MOVEMENT DATA Per visual inspection, moderate limb movement is observed.  IMPRESSIONS 1. Hypoventilation syndrome is documented during this recording.  2 L of nasal oxygen is recommended nocturnally.  2. Moderate periodic imb movement disorder sleep is observed. 3. Absent slow wave sleep is noted.  Delano Metz, MD Diplomate, American Board of Sleep Medicine.  ELECTRONICALLY SIGNED ON:  04/13/2017, 2:45 PM [place] PH: [phone]   FX: [phone] [place]

## 2017-04-14 ENCOUNTER — Other Ambulatory Visit: Payer: Self-pay | Admitting: Family Medicine

## 2017-04-16 ENCOUNTER — Encounter: Payer: Self-pay | Admitting: General Surgery

## 2017-04-16 ENCOUNTER — Ambulatory Visit (INDEPENDENT_AMBULATORY_CARE_PROVIDER_SITE_OTHER): Payer: Medicare Other | Admitting: General Surgery

## 2017-04-16 VITALS — BP 108/80 | HR 110 | Temp 98.7°F | Resp 18 | Ht 73.0 in | Wt 260.0 lb

## 2017-04-16 DIAGNOSIS — Z95828 Presence of other vascular implants and grafts: Secondary | ICD-10-CM

## 2017-04-16 DIAGNOSIS — C3490 Malignant neoplasm of unspecified part of unspecified bronchus or lung: Secondary | ICD-10-CM | POA: Diagnosis not present

## 2017-04-16 NOTE — Progress Notes (Signed)
[facility] History and Physical  Reason for Referral:Port a cath in place, removal  Referring Physician:  Dr. Walden Field, Oncology   Chief Complaint    Vascular Access Problem      Chase Herrera is a 67 y.o. male.  HPI: Chase Herrera is a 67 yo who has had a history of small cell lung cancer s/p treatment. He has completed his chemotherapy and radiation and reports a good response. He had his last chemotherapy mid 2018.  He is otherwise been well, but did have issues with bacteremia and was treated with antibiotics.  The patient has not had any other issues with the port.  He has been seen by Dr. Walden Field with Oncology and is being referred for port removal.  He is on Xarelto for A fib.   Past Medical History:  Diagnosis Date  . Alcohol abuse    Quit 08/23/11  . Arthritis   . Atrial fibrillation ([place])   . Atrial flutter ([place])    CTI ablation by Dr [name] 10/2011  . COPD (chronic obstructive pulmonary disease) ([place])   . Depression   . Essential hypertension   . History of cardiomyopathy    LVEF 25-30% 08/2011 with subsequent normalization  . Obesity   . Prostate enlargement   . Small cell lung cancer ([place])    Right upper lobe - follows with [facility]  . Type 2 diabetes mellitus ([place])     Past Surgical History:  Procedure Laterality Date  . APPENDECTOMY    . ATRIAL ABLATION SURGERY  11/06/2011   CTI ablation for atrial flutter by Dr [name]  . ATRIAL FLUTTER ABLATION N/A 11/06/2011   Procedure: ATRIAL FLUTTER ABLATION;  Surgeon: Thompson Grayer, MD;  Location: [facility] CATH LAB;  Service: Cardiovascular;  Laterality: N/A;  . Cataracts    . IR GENERIC HISTORICAL  04/09/2016   IR US GUIDE VASC ACCESS RIGHT 04/09/2016 [name], DO [facility]  . IR GENERIC HISTORICAL  04/09/2016   IR FLUORO GUIDE PORT INSERTION RIGHT 04/09/2016 [name], DO [facility]  . TONSILLECTOMY      Family History  Problem Relation Age of Onset  . Diabetes Sister   . Hypertension  Mother   . Lung cancer Mother   . Hypertension Father   . Heart attack Father   . Hypertension Sister   . Hypertension Sister   . Hypertension Sister   . Pulmonary fibrosis Sister     Social History   Tobacco Use  . Smoking status: Former Smoker    Packs/day: 0.50    Years: 47.00    Pack years: 23.50    Types: Cigarettes  . Smokeless tobacco: Former Systems developer    Quit date: 04/10/2014  . Tobacco comment: encouraged to quit today 03/04/12  Substance Use Topics  . Alcohol use: No    Frequency: Never    Comment: quit 2 years ago  . Drug use: No    Comment: marajuana occasionally    Medications: I have reviewed the patient's current medications. Allergies as of 04/16/2017   No Known Allergies     Medication List        Accurate as of 04/16/17 10:49 AM. Always use your most recent med list.          acetaminophen 325 MG tablet Commonly known as:  TYLENOL Take every 6 (six) hours as needed by mouth.   atorvastatin 20 MG tablet Commonly known as:  LIPITOR Take 1 tablet (20 mg total)  by mouth daily.   COMPLETE MULTIVITAMIN/MINERAL PO TAKE 1 TABLET BY MOUTH DAILY   digoxin 0.125 MG tablet Commonly known as:  LANOXIN Take 1 tablet (125 mcg total) by mouth daily.   diltiazem 120 MG 24 hr capsule Commonly known as:  CARDIZEM CD Take 120 mg by mouth daily.   diltiazem 360 MG 24 hr capsule Commonly known as:  TIAZAC Take 1 capsule (360 mg total) by mouth daily.   fluticasone 50 MCG/ACT nasal spray Commonly known as:  FLONASE INSTILL ONE SPRAY IN EACH NOSTRIL DAILY   fluticasone 50 MCG/ACT nasal spray Commonly known as:  FLONASE Place 2 sprays into both nostrils daily.   furosemide 40 MG tablet Commonly known as:  LASIX TAKE 1 TABLET BY MOUTH EVERY DAY   gabapentin 400 MG capsule Commonly known as:  NEURONTIN Take 1 capsule (400 mg total) by mouth 3 (three) times daily.   guaiFENesin 600 MG 12 hr tablet Commonly known as:  MUCINEX Take 1,200 mg daily by  mouth.   HYDROcodone-acetaminophen 10-325 MG tablet Commonly known as:  NORCO Take 1 tablet by mouth every 8 (eight) hours as needed for moderate pain.   LANTUS 100 UNIT/ML injection Generic drug:  insulin glargine Inject 25 Units into the skin 2 (two) times daily.   levalbuterol 0.63 MG/3ML nebulizer solution Commonly known as:  XOPENEX Inhale 3 mLs (0.63 mg total) into the lungs 4 (four) times daily.   metFORMIN 500 MG tablet Commonly known as:  GLUCOPHAGE Take 3 tablets (1,500 mg total) by mouth daily with breakfast.   metoprolol tartrate 50 MG tablet Commonly known as:  LOPRESSOR Take 1 tablet (50 mg total) by mouth 2 (two) times daily.   predniSONE 20 MG tablet Commonly known as:  DELTASONE TAKE TWO TABLETS BY MOUTH DAILY with food   PROAIR HFA 108 (90 Base) MCG/ACT inhaler Generic drug:  albuterol INHALE 2 PUFFS EVERY FOUR HOURS AS NEEDED FOR WHEEZING   QVAR 80 MCG/ACT inhaler Generic drug:  beclomethasone Inhale 1 puff daily into the lungs.   rivaroxaban 20 MG Tabs tablet Commonly known as:  XARELTO Take 20 mg by mouth.   SYMBICORT 160-4.5 MCG/ACT inhaler Generic drug:  budesonide-formoterol Inhale into the lungs.   tamsulosin 0.4 MG Caps capsule Commonly known as:  FLOMAX Take 1 capsule (0.4 mg total) by mouth 2 (two) times daily after a meal.   tiotropium 18 MCG inhalation capsule Commonly known as:  SPIRIVA HANDIHALER Place 1 capsule (18 mcg total) into inhaler and inhale daily.       ROS:  A comprehensive review of systems was negative except for: Eyes: positive for blurred vision Respiratory: positive for asthma, cough, wheezing and SOB Cardiovascular: positive for HTN Genitourinary: positive for frequency Musculoskeletal: positive for back pain and stiff joints Neurological: positive for numbness Endocrine: positive for sluggish, hot/cold intolerance  Blood pressure 108/80, pulse (!) 110, temperature 98.7 F (37.1 C), resp. rate 18, height  6\' 1"  (1.854 m), weight 260 lb (117.9 kg). Physical Exam  Constitutional: He is oriented to person, place, and time and well-developed, well-nourished, and in no distress.  HENT:  Head: Normocephalic.  Eyes: Pupils are equal, round, and reactive to light.  Neck: Normal range of motion.  Cardiovascular: Normal rate.  Pulmonary/Chest: Effort normal.  Right sided port with catheter in right jugular  Abdominal: Soft. He exhibits no distension. There is no tenderness.  Musculoskeletal: Normal range of motion. He exhibits edema.  Neurological: He is alert and oriented to  person, place, and time.  Skin: Skin is warm and dry.  Psychiatric: Mood, memory, affect and judgment normal.  Vitals reviewed.   Results: CT chest/a/pelvis- 03/25/2017  IMPRESSION: 1. Residual subpleural soft tissue thickening in the apical segment right upper lobe, with adjacent post treatment changes in the right upper and right lower lobes, stable. No evidence of recurrent or metastatic disease. 2. Aortic atherosclerosis (ICD10-170.0). Coronary artery calcification. 3.  Emphysema (ICD10-J43.9). 4. Hepatic steatosis.  Assessment & Plan:  Chase Herrera is a 67 y.o. male with a port a catheter in place in the right IJ that was placed by intervention radiology 04/09/16. He has had a history of bacteremia in the past that was treated but since has had no issues. He otherwise is doing well. He is ready to get the port out.  - Hold Xarelto for 3 days prior to removal - Plan for minor procedure with local, port a catheter removal   All questions were answered to the satisfaction of the patient.  The risk and benefits of port a catheter removal were discussed including but not limited to bleeding, infection, risk of incomplete removal and pneumothorax.  After careful consideration, Chase Herrera has decided to proceed.    Chase Herrera 04/16/2017, 10:49 AM

## 2017-04-16 NOTE — H&P (Signed)
[facility] History and Physical  Reason for Referral:Port a cath in place, removal  Referring Physician:  Dr. Walden Field, Oncology   Chief Complaint    Vascular Access Problem      Chase Herrera is a 67 y.o. male.  HPI: Chase Herrera is a 67 yo who has had a history of small cell lung cancer s/p treatment. He has completed his chemotherapy and radiation and reports a good response. He had his last chemotherapy mid 2018.  He is otherwise been well, but did have issues with bacteremia and was treated with antibiotics.  The patient has not had any other issues with the port.  He has been seen by Dr. Walden Field with Oncology and is being referred for port removal.  He is on Xarelto for A fib.   Past Medical History:  Diagnosis Date  . Alcohol abuse    Quit 08/23/11  . Arthritis   . Atrial fibrillation ([place])   . Atrial flutter ([place])    CTI ablation by Dr [name] 10/2011  . COPD (chronic obstructive pulmonary disease) ([place])   . Depression   . Essential hypertension   . History of cardiomyopathy    LVEF 25-30% 08/2011 with subsequent normalization  . Obesity   . Prostate enlargement   . Small cell lung cancer ([place])    Right upper lobe - follows with [facility]  . Type 2 diabetes mellitus ([place])     Past Surgical History:  Procedure Laterality Date  . APPENDECTOMY    . ATRIAL ABLATION SURGERY  11/06/2011   CTI ablation for atrial flutter by Dr [name]  . ATRIAL FLUTTER ABLATION N/A 11/06/2011   Procedure: ATRIAL FLUTTER ABLATION;  Surgeon: Thompson Grayer, MD;  Location[facility] CATH LAB;  Service: Cardiovascular;  Laterality: N/A;  . Cataracts    . IR GENERIC HISTORICAL  04/09/2016   IR US GUIDE VASC ACCESS RIGHT 04/09/2016 [name], DO [facility]  . IR GENERIC HISTORICAL  04/09/2016   IR FLUORO GUIDE PORT INSERTION RIGHT 04/09/2016 [name], DO [facility]  . TONSILLECTOMY      Family History  Problem Relation Age of Onset  . Diabetes Sister   . Hypertension  Mother   . Lung cancer Mother   . Hypertension Father   . Heart attack Father   . Hypertension Sister   . Hypertension Sister   . Hypertension Sister   . Pulmonary fibrosis Sister     Social History   Tobacco Use  . Smoking status: Former Smoker    Packs/day: 0.50    Years: 47.00    Pack years: 23.50    Types: Cigarettes  . Smokeless tobacco: Former Systems developer    Quit date: 04/10/2014  . Tobacco comment: encouraged to quit today 03/04/12  Substance Use Topics  . Alcohol use: No    Frequency: Never    Comment: quit 2 years ago  . Drug use: No    Comment: marajuana occasionally    Medications: I have reviewed the patient's current medications. Allergies as of 04/16/2017   No Known Allergies     Medication List        Accurate as of 04/16/17 10:49 AM. Always use your most recent med list.          acetaminophen 325 MG tablet Commonly known as:  TYLENOL Take every 6 (six) hours as needed by mouth.   atorvastatin 20 MG tablet Commonly known as:  LIPITOR Take 1 tablet (20 mg total)  by mouth daily.   COMPLETE MULTIVITAMIN/MINERAL PO TAKE 1 TABLET BY MOUTH DAILY   digoxin 0.125 MG tablet Commonly known as:  LANOXIN Take 1 tablet (125 mcg total) by mouth daily.   diltiazem 120 MG 24 hr capsule Commonly known as:  CARDIZEM CD Take 120 mg by mouth daily.   diltiazem 360 MG 24 hr capsule Commonly known as:  TIAZAC Take 1 capsule (360 mg total) by mouth daily.   fluticasone 50 MCG/ACT nasal spray Commonly known as:  FLONASE INSTILL ONE SPRAY IN EACH NOSTRIL DAILY   fluticasone 50 MCG/ACT nasal spray Commonly known as:  FLONASE Place 2 sprays into both nostrils daily.   furosemide 40 MG tablet Commonly known as:  LASIX TAKE 1 TABLET BY MOUTH EVERY DAY   gabapentin 400 MG capsule Commonly known as:  NEURONTIN Take 1 capsule (400 mg total) by mouth 3 (three) times daily.   guaiFENesin 600 MG 12 hr tablet Commonly known as:  MUCINEX Take 1,200 mg daily by  mouth.   HYDROcodone-acetaminophen 10-325 MG tablet Commonly known as:  NORCO Take 1 tablet by mouth every 8 (eight) hours as needed for moderate pain.   LANTUS 100 UNIT/ML injection Generic drug:  insulin glargine Inject 25 Units into the skin 2 (two) times daily.   levalbuterol 0.63 MG/3ML nebulizer solution Commonly known as:  XOPENEX Inhale 3 mLs (0.63 mg total) into the lungs 4 (four) times daily.   metFORMIN 500 MG tablet Commonly known as:  GLUCOPHAGE Take 3 tablets (1,500 mg total) by mouth daily with breakfast.   metoprolol tartrate 50 MG tablet Commonly known as:  LOPRESSOR Take 1 tablet (50 mg total) by mouth 2 (two) times daily.   predniSONE 20 MG tablet Commonly known as:  DELTASONE TAKE TWO TABLETS BY MOUTH DAILY with food   PROAIR HFA 108 (90 Base) MCG/ACT inhaler Generic drug:  albuterol INHALE 2 PUFFS EVERY FOUR HOURS AS NEEDED FOR WHEEZING   QVAR 80 MCG/ACT inhaler Generic drug:  beclomethasone Inhale 1 puff daily into the lungs.   rivaroxaban 20 MG Tabs tablet Commonly known as:  XARELTO Take 20 mg by mouth.   SYMBICORT 160-4.5 MCG/ACT inhaler Generic drug:  budesonide-formoterol Inhale into the lungs.   tamsulosin 0.4 MG Caps capsule Commonly known as:  FLOMAX Take 1 capsule (0.4 mg total) by mouth 2 (two) times daily after a meal.   tiotropium 18 MCG inhalation capsule Commonly known as:  SPIRIVA HANDIHALER Place 1 capsule (18 mcg total) into inhaler and inhale daily.       ROS:  A comprehensive review of systems was negative except for: Eyes: positive for blurred vision Respiratory: positive for asthma, cough, wheezing and SOB Cardiovascular: positive for HTN Genitourinary: positive for frequency Musculoskeletal: positive for back pain and stiff joints Neurological: positive for numbness Endocrine: positive for sluggish, hot/cold intolerance  Blood pressure 108/80, pulse (!) 110, temperature 98.7 F (37.1 C), resp. rate 18, height  6\' 1"  (1.854 m), weight 260 lb (117.9 kg). Physical Exam  Constitutional: He is oriented to person, place, and time and well-developed, well-nourished, and in no distress.  HENT:  Head: Normocephalic.  Eyes: Pupils are equal, round, and reactive to light.  Neck: Normal range of motion.  Cardiovascular: Normal rate.  Pulmonary/Chest: Effort normal.  Right sided port with catheter in right jugular  Abdominal: Soft. He exhibits no distension. There is no tenderness.  Musculoskeletal: Normal range of motion. He exhibits edema.  Neurological: He is alert and oriented to  person, place, and time.  Skin: Skin is warm and dry.  Psychiatric: Mood, memory, affect and judgment normal.  Vitals reviewed.   Results: CT chest/a/pelvis- 03/25/2017  IMPRESSION: 1. Residual subpleural soft tissue thickening in the apical segment right upper lobe, with adjacent post treatment changes in the right upper and right lower lobes, stable. No evidence of recurrent or metastatic disease. 2. Aortic atherosclerosis (ICD10-170.0). Coronary artery calcification. 3.  Emphysema (ICD10-J43.9). 4. Hepatic steatosis.  Assessment & Plan:  Chase Herrera is a 67 y.o. male with a port a catheter in place in the right IJ that was placed by intervention radiology 04/09/16. He has had a history of bacteremia in the past that was treated but since has had no issues. He otherwise is doing well. He is ready to get the port out.  - Hold Xarelto for 3 days prior to removal - Plan for minor procedure with local, port a catheter removal   All questions were answered to the satisfaction of the patient.  The risk and benefits of port a catheter removal were discussed including but not limited to bleeding, infection, risk of incomplete removal and pneumothorax.  After careful consideration, Chase Herrera has decided to proceed.    [name] 04/16/2017, 10:49 AM

## 2017-04-20 DIAGNOSIS — Z923 Personal history of irradiation: Secondary | ICD-10-CM | POA: Diagnosis not present

## 2017-04-20 DIAGNOSIS — R0602 Shortness of breath: Secondary | ICD-10-CM | POA: Diagnosis not present

## 2017-04-20 DIAGNOSIS — E119 Type 2 diabetes mellitus without complications: Secondary | ICD-10-CM | POA: Diagnosis not present

## 2017-04-20 DIAGNOSIS — J441 Chronic obstructive pulmonary disease with (acute) exacerbation: Secondary | ICD-10-CM | POA: Diagnosis not present

## 2017-04-20 DIAGNOSIS — N400 Enlarged prostate without lower urinary tract symptoms: Secondary | ICD-10-CM | POA: Diagnosis not present

## 2017-04-20 DIAGNOSIS — Z7901 Long term (current) use of anticoagulants: Secondary | ICD-10-CM | POA: Diagnosis not present

## 2017-04-20 DIAGNOSIS — I4891 Unspecified atrial fibrillation: Secondary | ICD-10-CM | POA: Diagnosis not present

## 2017-04-20 DIAGNOSIS — Z794 Long term (current) use of insulin: Secondary | ICD-10-CM | POA: Diagnosis not present

## 2017-04-20 DIAGNOSIS — Z79899 Other long term (current) drug therapy: Secondary | ICD-10-CM | POA: Diagnosis not present

## 2017-04-20 DIAGNOSIS — Z836 Family history of other diseases of the respiratory system: Secondary | ICD-10-CM | POA: Diagnosis not present

## 2017-04-20 DIAGNOSIS — Z85118 Personal history of other malignant neoplasm of bronchus and lung: Secondary | ICD-10-CM | POA: Diagnosis not present

## 2017-04-20 DIAGNOSIS — I110 Hypertensive heart disease with heart failure: Secondary | ICD-10-CM | POA: Diagnosis not present

## 2017-04-20 DIAGNOSIS — I509 Heart failure, unspecified: Secondary | ICD-10-CM | POA: Diagnosis not present

## 2017-04-20 DIAGNOSIS — Z87891 Personal history of nicotine dependence: Secondary | ICD-10-CM | POA: Diagnosis not present

## 2017-04-22 NOTE — Progress Notes (Signed)
Diagnosis Malignant neoplasm of unspecified part of unspecified bronchus or lung ([place]) - Plan: CT Chest W Contrast  Staging Cancer Staging Small cell lung cancer ([place]) Staging form: Lung, AJCC 8th Edition - Clinical: Stage IIIC (cT3, cN3, cM0) - Signed by Twana First, MD on 04/16/2016   Assessment and Plan:  1.  Small cell carcinoma of RUL  Cancer Staging Small cell lung cancer ([place]) Staging form: Lung, AJCC 8th Edition - Clinical: Stage IIIC (cT3, cN3, cM0) - Signed by Twana First, MD on 04/16/2016  Pt was previously followed by Dr. Talbert Cage and was treated with 4 cycles of chemo with cisplatin +etoposise 04/16/16- 06/21/16 along with concurrent RT which he completed on 07/12/16. S/p PCI.  He is here to go over CT CAP done 03/25/2017 that showed:  IMPRESSION: 1. Residual subpleural soft tissue thickening in the apical segment right upper lobe, with adjacent post treatment changes in the right upper and right lower lobes, stable. No evidence of recurrent or metastatic disease. 2. Aortic atherosclerosis (ICD10-170.0). Coronary artery calcification. 3.  Emphysema (ICD10-J43.9). 4. Hepatic steatosis.  He will be set up for scans in 06/2017 and will follow-up at that time to go over results.    2.  Hypertension.  BP is 94/46.  Follow-up with PCP.   3.    Current Status:  Pt is seen today for follow-up.  He is here to go over scans.      Small cell lung cancer ([place])   04/03/2016 Initial Diagnosis    Small cell lung cancer ([place])      04/09/2016 Imaging    MRI brain- Negative for metastatic disease to the brain. No acute abnormality.      04/09/2016 Procedure    Right IJ port catheter placement by IR      04/13/2016 PET scan    . The right apical lung mass has significantly enlarged and there is new right paratracheal adenopathy as well as a hypermetabolic new lymph node in the right inner infraclavicular fossa potentially with early impingement on adjacent neurovascular  structures. 2. Other imaging findings of potential clinical significance: Chronic bilateral maxillary sinusitis. Left carotid atherosclerotic calcification. Aortoiliac atherosclerotic vascular disease. Coronary atherosclerosis. Paraseptal emphysema. Mild atelectasis in the lung bases. Scattered sigmoid colon diverticula. Probable right pars defect at L5.      05/09/2016 - 07/12/2016 Radiation Therapy    Radiotherapy to right Lung+MS 63 Gy/35 fractions at 1.8 GY/f using 6 x photons 3- D CRT technique Dates: 4/18-6/21/18          06/19/2016 Treatment Plan Change    Cisplatin dose reduced by 20% for cycle #4 due to severe thrombocytopenia following cycle #3 (13,000).      08/21/2016 PET scan    1. Marked reduction in size and metabolic activity of RIGHT upper lobe mass. 2. Resolution of mediastinal and RIGHT supraclavicular lymphadenopathy. 3. No evidence of disease progression.        Problem List Patient Active Problem List   Diagnosis Date Noted  . Type 2 diabetes mellitus ([place]) [E11.9]   . Essential hypertension [I10]   . Atrial fibrillation ([place]) [I48.91] 11/29/2016  . Diabetes mellitus type 2 in obese ([place]) [E11.69, E66.9] 11/29/2016  . Bacteremia due to Gram-positive bacteria [R78.81] 11/29/2016  . Healthcare-associated pneumonia [J18.9] 11/29/2016  . Small cell lung cancer ([place]) [C34.90] 04/03/2016  . Lung nodule [R91.1] 12/28/2015  . AF (atrial fibrillation) ([place]) [I48.91] 10/01/2013  . COPD (chronic obstructive pulmonary disease) ([place]) [J44.9] 02/08/2012  . Essential  hypertension, benign [I10] 01/06/2012  . Atrial flutter ([place]) [I48.92] 01/06/2012  . Nonischemic cardiomyopathy ([place]) [I42.8] 10/15/2011  . Tobacco abuse [Z72.0] 10/15/2011    Past Medical History Past Medical History:  Diagnosis Date  . Alcohol abuse    Quit 08/23/11  . Arthritis   . Atrial fibrillation ([place])   . Atrial flutter ([place])    CTI ablation by Dr [name] 10/2011  . COPD (chronic  obstructive pulmonary disease) ([place])   . Depression   . Essential hypertension   . History of cardiomyopathy    LVEF 25-30% 08/2011 with subsequent normalization  . Obesity   . Prostate enlargement   . Small cell lung cancer ([place])    Right upper lobe - follows with [facility]  . Type 2 diabetes mellitus [facility])     Past Surgical History Past Surgical History:  Procedure Laterality Date  . APPENDECTOMY    . ATRIAL ABLATION SURGERY  11/06/2011   CTI ablation for atrial flutter by Dr [name]  . ATRIAL FLUTTER ABLATION N/A 11/06/2011   Procedure: ATRIAL FLUTTER ABLATION;  Surgeon: Thompson Grayer, MD;  Location: [facility] CATH LAB;  Service: Cardiovascular;  Laterality: N/A;  . Cataracts    . IR GENERIC HISTORICAL  04/09/2016   IR US GUIDE VASC ACCESS RIGHT 04/09/2016 [name], DO [facility]-INTERV RAD  . IR GENERIC HISTORICAL  04/09/2016   IR FLUORO GUIDE PORT INSERTION RIGHT 04/09/2016 [name], DO [facility]  . TONSILLECTOMY      Family History Family History  Problem Relation Age of Onset  . Diabetes Sister   . Hypertension Mother   . Lung cancer Mother   . Hypertension Father   . Heart attack Father   . Hypertension Sister   . Hypertension Sister   . Hypertension Sister   . Pulmonary fibrosis Sister      Social History  reports that he has quit smoking. His smoking use included cigarettes. He has a 23.50 pack-year smoking history. He quit smokeless tobacco use about 3 years ago. He reports that he does not drink alcohol or use drugs.  Medications  Current Outpatient Medications:  .  acetaminophen (TYLENOL) 325 MG tablet, Take every 6 (six) hours as needed by mouth., Disp: , Rfl:  .  atorvastatin (LIPITOR) 20 MG tablet, Take 1 tablet (20 mg total) by mouth daily., Disp: 90 tablet, Rfl: 3 .  beclomethasone (QVAR) 80 MCG/ACT inhaler, Inhale 1 puff daily into the lungs., Disp: , Rfl:  .  budesonide-formoterol (SYMBICORT) 160-4.5 MCG/ACT inhaler, Inhale into the lungs., Disp: ,  Rfl:  .  digoxin (LANOXIN) 0.125 MG tablet, Take 1 tablet (125 mcg total) by mouth daily., Disp: 90 tablet, Rfl: 1 .  diltiazem (CARDIZEM CD) 120 MG 24 hr capsule, Take 120 mg by mouth daily., Disp: , Rfl: 6 .  diltiazem (TIAZAC) 360 MG 24 hr capsule, Take 1 capsule (360 mg total) by mouth daily., Disp: 90 capsule, Rfl: 1 .  fluticasone (FLONASE) 50 MCG/ACT nasal spray, INSTILL ONE SPRAY IN EACH NOSTRIL DAILY, Disp: , Rfl: 0 .  fluticasone (FLONASE) 50 MCG/ACT nasal spray, Place 2 sprays into both nostrils daily., Disp: 16 g, Rfl: 6 .  furosemide (LASIX) 40 MG tablet, TAKE 1 TABLET BY MOUTH EVERY DAY, Disp: 90 tablet, Rfl: 0 .  gabapentin (NEURONTIN) 400 MG capsule, Take 1 capsule (400 mg total) by mouth 3 (three) times daily., Disp: 180 capsule, Rfl: 3 .  guaiFENesin (MUCINEX) 600 MG 12 hr tablet, Take 1,200 mg  daily by mouth., Disp: , Rfl:  .  HYDROcodone-acetaminophen (NORCO) 10-325 MG tablet, Take 1 tablet by mouth every 8 (eight) hours as needed for moderate pain., Disp: 40 tablet, Rfl: 0 .  insulin glargine (LANTUS) 100 UNIT/ML injection, Inject 25 Units into the skin 2 (two) times daily., Disp: , Rfl:  .  levalbuterol (XOPENEX) 0.63 MG/3ML nebulizer solution, Inhale 3 mLs (0.63 mg total) into the lungs 4 (four) times daily., Disp: 3 mL, Rfl: 0 .  metFORMIN (GLUCOPHAGE) 500 MG tablet, Take 3 tablets (1,500 mg total) by mouth daily with breakfast., Disp: 270 tablet, Rfl: 1 .  metoprolol tartrate (LOPRESSOR) 50 MG tablet, Take 1 tablet (50 mg total) by mouth 2 (two) times daily., Disp: 180 tablet, Rfl: 3 .  Multiple Vitamins-Minerals (COMPLETE MULTIVITAMIN/MINERAL PO), TAKE 1 TABLET BY MOUTH DAILY, Disp: , Rfl: 99 .  predniSONE (DELTASONE) 20 MG tablet, TAKE TWO TABLETS BY MOUTH DAILY with food, Disp: , Rfl: 0 .  rivaroxaban (XARELTO) 20 MG TABS tablet, Take 20 mg by mouth., Disp: , Rfl:  .  tamsulosin (FLOMAX) 0.4 MG CAPS capsule, Take 1 capsule (0.4 mg total) by mouth 2 (two) times daily after  a meal., Disp: 180 capsule, Rfl: 1 .  tiotropium (SPIRIVA HANDIHALER) 18 MCG inhalation capsule, Place 1 capsule (18 mcg total) into inhaler and inhale daily., Disp: 30 capsule, Rfl: 12 .  PROAIR HFA 108 (90 Base) MCG/ACT inhaler, INHALE 2 PUFFS EVERY FOUR HOURS AS NEEDED FOR WHEEZING, Disp: 18 g, Rfl: 1  Allergies Patient has no known allergies.  Review of Systems Review of Systems - Oncology ROS as per HPI otherwise 12 point ROS is negative.   Physical Exam  Vitals Wt Readings from Last 3 Encounters:  04/16/17 260 lb (117.9 kg)  02/12/17 258 lb (117 kg)  12/28/16 270 lb (122.5 kg)   Temp Readings from Last 3 Encounters:  04/16/17 98.7 F (37.1 C)  03/27/17 99.1 F (37.3 C) (Oral)  03/06/17 (!) 97 F (36.1 C) (Temporal)   BP Readings from Last 3 Encounters:  04/16/17 108/80  03/27/17 (!) 94/46  03/06/17 118/60   Pulse Readings from Last 3 Encounters:  04/16/17 (!) 110  03/27/17 83  03/06/17 90   Constitutional: Well-developed, well-nourished, and in no distress.   HENT: Head: Normocephalic and atraumatic.  Mouth/Throat: No oropharyngeal exudate. Mucosa moist. Eyes: Pupils are equal, round, and reactive to light. Conjunctivae are normal. No scleral icterus.  Neck: Normal range of motion. Neck supple. No JVD present.  Cardiovascular: Normal rate, regular rhythm and normal heart sounds.  Exam reveals no gallop and no friction rub.   No murmur heard. Pulmonary/Chest: Effort normal and breath sounds normal. No respiratory distress. No wheezes.No rales.  Abdominal: Soft. Bowel sounds are normal. No distension. There is no tenderness. There is no guarding.  Musculoskeletal: No edema or tenderness.  Lymphadenopathy: No cervical, axillary or supraclavicular adenopathy.  Neurological: Alert and oriented to person, place, and time. No cranial nerve deficit.  Skin: Skin is warm and dry. No rash noted. No erythema. No pallor.  Psychiatric: Affect and judgment normal.    Labs No visits with results within 3 Day(s) from this visit.  Latest known visit with results is:  Appointment on 03/20/2017  Component Date Value Ref Range Status  . WBC 03/20/2017 8.4  4.0 - 10.5 K/uL Final  . RBC 03/20/2017 3.71* 4.22 - 5.81 MIL/uL Final  . Hemoglobin 03/20/2017 12.0* 13.0 - 17.0 g/dL Final  . HCT 03/20/2017 38.2*  39.0 - 52.0 % Final  . MCV 03/20/2017 103.0* 78.0 - 100.0 fL Final  . MCH 03/20/2017 32.3  26.0 - 34.0 pg Final  . MCHC 03/20/2017 31.4  30.0 - 36.0 g/dL Final  . RDW 03/20/2017 14.6  11.5 - 15.5 % Final  . Platelets 03/20/2017 181  150 - 400 K/uL Final  . Neutrophils Relative % 03/20/2017 81  % Final  . Neutro Abs 03/20/2017 6.9  1.7 - 7.7 K/uL Final  . Lymphocytes Relative 03/20/2017 14  % Final  . Lymphs Abs 03/20/2017 1.2  0.7 - 4.0 K/uL Final  . Monocytes Relative 03/20/2017 3  % Final  . Monocytes Absolute 03/20/2017 0.2  0.1 - 1.0 K/uL Final  . Eosinophils Relative 03/20/2017 2  % Final  . Eosinophils Absolute 03/20/2017 0.1  0.0 - 0.7 K/uL Final  . Basophils Relative 03/20/2017 0  % Final  . Basophils Absolute 03/20/2017 0.0  0.0 - 0.1 K/uL Final   Performed at [facility], [address], [place], [place] [address]  . Sodium 03/20/2017 138  135 - 145 mmol/L Final  . Potassium 03/20/2017 3.9  3.5 - 5.1 mmol/L Final  . Chloride 03/20/2017 100* 101 - 111 mmol/L Final  . CO2 03/20/2017 25  22 - 32 mmol/L Final  . Glucose, Bld 03/20/2017 284* 65 - 99 mg/dL Final  . BUN 03/20/2017 20  6 - 20 mg/dL Final  . Creatinine, Ser 03/20/2017 1.35* 0.61 - 1.24 mg/dL Final  . Calcium 03/20/2017 9.3  8.9 - 10.3 mg/dL Final  . Total Protein 03/20/2017 6.3* 6.5 - 8.1 g/dL Final  . Albumin 03/20/2017 3.7  3.5 - 5.0 g/dL Final  . AST 03/20/2017 29  15 - 41 U/L Final  . ALT 03/20/2017 32  17 - 63 U/L Final  . Alkaline Phosphatase 03/20/2017 47  38 - 126 U/L Final  . Total Bilirubin 03/20/2017 0.2* 0.3 - 1.2 mg/dL Final  . GFR calc non Af Amer 03/20/2017 54*  >60 mL/min Final  . GFR calc Af Amer 03/20/2017 >60  >60 mL/min Final   Comment: (NOTE) The eGFR has been calculated using the CKD EPI equation. This calculation has not been validated in all clinical situations. eGFR's persistently <60 mL/min signify possible Chronic Kidney Disease.   Georgiann Hahn gap 03/20/2017 13  5 - 15 Final   Performed at [facility], [address] [place], [place] [address]     Pathology Orders Placed This Encounter  Procedures  . CT Chest W Contrast    Standing Status:   Future    Standing Expiration Date:   03/27/2018    Order Specific Question:   If indicated for the ordered procedure, I authorize the administration of contrast media per Radiology protocol    Answer:   Yes    Order Specific Question:   Preferred imaging location?    Answer:   [facility]    Order Specific Question:   Radiology Contrast Protocol - do NOT remove file path    Answer:   [email or web address]       Zoila Shutter MD

## 2017-04-23 ENCOUNTER — Encounter: Payer: Self-pay | Admitting: Family Medicine

## 2017-04-23 ENCOUNTER — Ambulatory Visit (INDEPENDENT_AMBULATORY_CARE_PROVIDER_SITE_OTHER): Payer: Medicare Other | Admitting: Family Medicine

## 2017-04-23 ENCOUNTER — Other Ambulatory Visit: Payer: Self-pay

## 2017-04-23 VITALS — BP 102/50 | HR 77 | Temp 99.1°F | Resp 24 | Ht 73.0 in | Wt 255.0 lb

## 2017-04-23 DIAGNOSIS — J309 Allergic rhinitis, unspecified: Secondary | ICD-10-CM

## 2017-04-23 DIAGNOSIS — R262 Difficulty in walking, not elsewhere classified: Secondary | ICD-10-CM | POA: Diagnosis not present

## 2017-04-23 DIAGNOSIS — Z7901 Long term (current) use of anticoagulants: Secondary | ICD-10-CM | POA: Diagnosis not present

## 2017-04-23 DIAGNOSIS — Z79899 Other long term (current) drug therapy: Secondary | ICD-10-CM | POA: Diagnosis not present

## 2017-04-23 DIAGNOSIS — I4891 Unspecified atrial fibrillation: Secondary | ICD-10-CM | POA: Diagnosis not present

## 2017-04-23 DIAGNOSIS — K760 Fatty (change of) liver, not elsewhere classified: Secondary | ICD-10-CM | POA: Insufficient documentation

## 2017-04-23 DIAGNOSIS — J310 Chronic rhinitis: Secondary | ICD-10-CM | POA: Diagnosis not present

## 2017-04-23 DIAGNOSIS — Z85118 Personal history of other malignant neoplasm of bronchus and lung: Secondary | ICD-10-CM | POA: Diagnosis not present

## 2017-04-23 DIAGNOSIS — Z8249 Family history of ischemic heart disease and other diseases of the circulatory system: Secondary | ICD-10-CM | POA: Diagnosis not present

## 2017-04-23 DIAGNOSIS — C3490 Malignant neoplasm of unspecified part of unspecified bronchus or lung: Secondary | ICD-10-CM | POA: Diagnosis not present

## 2017-04-23 DIAGNOSIS — Z87891 Personal history of nicotine dependence: Secondary | ICD-10-CM | POA: Diagnosis not present

## 2017-04-23 DIAGNOSIS — I110 Hypertensive heart disease with heart failure: Secondary | ICD-10-CM | POA: Diagnosis not present

## 2017-04-23 DIAGNOSIS — J449 Chronic obstructive pulmonary disease, unspecified: Secondary | ICD-10-CM | POA: Diagnosis not present

## 2017-04-23 DIAGNOSIS — Z836 Family history of other diseases of the respiratory system: Secondary | ICD-10-CM | POA: Diagnosis not present

## 2017-04-23 DIAGNOSIS — R29898 Other symptoms and signs involving the musculoskeletal system: Secondary | ICD-10-CM | POA: Diagnosis not present

## 2017-04-23 DIAGNOSIS — I700 Atherosclerosis of aorta: Secondary | ICD-10-CM | POA: Insufficient documentation

## 2017-04-23 DIAGNOSIS — A46 Erysipelas: Secondary | ICD-10-CM | POA: Diagnosis not present

## 2017-04-23 DIAGNOSIS — Z794 Long term (current) use of insulin: Secondary | ICD-10-CM | POA: Diagnosis not present

## 2017-04-23 DIAGNOSIS — E119 Type 2 diabetes mellitus without complications: Secondary | ICD-10-CM | POA: Diagnosis not present

## 2017-04-23 DIAGNOSIS — T485X5A Adverse effect of other anti-common-cold drugs, initial encounter: Secondary | ICD-10-CM

## 2017-04-23 DIAGNOSIS — R530 Neoplastic (malignant) related fatigue: Secondary | ICD-10-CM | POA: Diagnosis not present

## 2017-04-23 DIAGNOSIS — I509 Heart failure, unspecified: Secondary | ICD-10-CM | POA: Diagnosis not present

## 2017-04-23 MED ORDER — CETIRIZINE HCL 10 MG PO TABS: 10.0000 mg | ORAL_TABLET | Freq: Every day | ORAL | 11 refills | Status: DC

## 2017-04-23 MED ORDER — PREDNISONE 20 MG PO TABS: 20.0000 mg | ORAL_TABLET | Freq: Two times a day (BID) | ORAL | 0 refills | Status: DC

## 2017-04-23 NOTE — Patient Instructions (Signed)
Take the prednisone twice a day Use cold compresses to face Continue the flonase Take zyrtec once a day Return towards end of week for re check  Try NOT to use the decongestant nasal spray

## 2017-04-23 NOTE — Progress Notes (Signed)
Chief Complaint  Patient presents with  . Nasal Congestion    feels like its almost swollen shut   Patient is here as a walk-in.  Arrived at the office asking to be seen for nasal congestion, nasal swelling, and facial swelling.  He noticed it today, and went to his local pharmacy to see what he could take to treat it.  He was told by his pharmacist at [facility] drug to use Benadryl.  He took a Benadryl and saw a minor improvement, but not a lot.  He cannot breathe at all through the left nostril, can breathe a limited amount through the right nostril. He has a long history of not being able to breathe through his nose well, he uses Flonase, unfortunately he uses a decongestant phenylephrine spray daily for years.  I explained to him that this can cause a condition called rhinitis medicamentosa, it damages the inside of the nasal membranes, and sometimes they become swollen to the point where they will not go down.  He uses the Flonase intermittently but it does not work as well.  He takes prednisone periodically for his COPD, and does not notice that this helps his nasal swelling.  He states that he opened a new bottle of decongestant yesterday and used it last night.  He woke up with his face swollen and red this morning.  He did not make the association that this might have caused a reaction because he uses it so frequently.  There is no open wound or bleeding.  There is no fever or chills.  There is no runny nose or postnasal drip.  No sore throat ear pressure or pain.  No difficulty breathing, no change in his voice, no difficulty swallowing.  No new medicines, foods, soap, lotion, or product identified.  Patient Active Problem List   Diagnosis Date Noted  . Aortic atherosclerosis ([place]) 04/23/2017  . Hepatic steatosis 04/23/2017  . Type 2 diabetes mellitus ([place])   . Essential hypertension   . Atrial fibrillation ([place]) 11/29/2016  . Diabetes mellitus type 2 in obese ([place]) 11/29/2016  .  Healthcare-associated pneumonia 11/29/2016  . Small cell lung cancer ([place]) 04/03/2016  . COPD (chronic obstructive pulmonary disease) ([place]) 02/08/2012  . Atrial flutter ([place]) 01/06/2012  . Nonischemic cardiomyopathy ([place]) 10/15/2011  . Tobacco abuse 10/15/2011    Outpatient Encounter Medications as of 04/23/2017  Medication Sig  . acetaminophen (TYLENOL) 325 MG tablet Take every 6 (six) hours as needed by mouth.  Marland Kitchen atorvastatin (LIPITOR) 20 MG tablet Take 1 tablet (20 mg total) by mouth daily.  . beclomethasone (QVAR) 80 MCG/ACT inhaler Inhale 1 puff daily into the lungs.  . budesonide-formoterol (SYMBICORT) 160-4.5 MCG/ACT inhaler Inhale into the lungs.  . digoxin (LANOXIN) 0.125 MG tablet Take 1 tablet (125 mcg total) by mouth daily.  Marland Kitchen diltiazem (CARDIZEM CD) 120 MG 24 hr capsule Take 120 mg by mouth daily.  Marland Kitchen diltiazem (TIAZAC) 360 MG 24 hr capsule Take 1 capsule (360 mg total) by mouth daily.  . fluticasone (FLONASE) 50 MCG/ACT nasal spray Place 2 sprays into both nostrils daily.  . furosemide (LASIX) 40 MG tablet TAKE 1 TABLET BY MOUTH EVERY DAY  . gabapentin (NEURONTIN) 400 MG capsule Take 1 capsule (400 mg total) by mouth 3 (three) times daily.  Marland Kitchen guaiFENesin (MUCINEX) 600 MG 12 hr tablet Take 1,200 mg daily by mouth.  . insulin glargine (LANTUS) 100 UNIT/ML injection Inject 25 Units into the skin 2 (two) times daily.  Marland Kitchen  levalbuterol (XOPENEX) 0.63 MG/3ML nebulizer solution Inhale 3 mLs (0.63 mg total) into the lungs 4 (four) times daily.  . metFORMIN (GLUCOPHAGE) 500 MG tablet Take 3 tablets (1,500 mg total) by mouth daily with breakfast.  . metoprolol tartrate (LOPRESSOR) 50 MG tablet Take 1 tablet (50 mg total) by mouth 2 (two) times daily.  . Multiple Vitamins-Minerals (COMPLETE MULTIVITAMIN/MINERAL PO) TAKE 1 TABLET BY MOUTH DAILY  . PROAIR HFA 108 (90 Base) MCG/ACT inhaler INHALE 2 PUFFS EVERY FOUR HOURS AS NEEDED FOR WHEEZING  . tamsulosin (FLOMAX) 0.4 MG CAPS capsule Take 1  capsule (0.4 mg total) by mouth 2 (two) times daily after a meal.  . tiotropium (SPIRIVA HANDIHALER) 18 MCG inhalation capsule Place 1 capsule (18 mcg total) into inhaler and inhale daily.  . [DISCONTINUED] fluticasone (FLONASE) 50 MCG/ACT nasal spray INSTILL ONE SPRAY IN EACH NOSTRIL DAILY  . [DISCONTINUED] predniSONE (DELTASONE) 20 MG tablet TAKE TWO TABLETS BY MOUTH DAILY with food  . cetirizine (ZYRTEC) 10 MG tablet Take 1 tablet (10 mg total) by mouth daily.  Marland Kitchen HYDROcodone-acetaminophen (NORCO) 10-325 MG tablet Take 1 tablet by mouth every 8 (eight) hours as needed for moderate pain. (Patient not taking: Reported on 04/23/2017)  . predniSONE (DELTASONE) 20 MG tablet Take 1 tablet (20 mg total) by mouth 2 (two) times daily with a meal.  . rivaroxaban (XARELTO) 20 MG TABS tablet Take 20 mg by mouth.  . traZODone (DESYREL) 50 MG tablet Take 50 mg by mouth at bedtime.   No facility-administered encounter medications on file as of 04/23/2017.     No Known Allergies  Review of Systems  Constitutional: Negative for activity change, appetite change, chills and fever.  HENT: Positive for congestion and facial swelling. Negative for ear pain, postnasal drip, rhinorrhea, sinus pressure, sore throat, trouble swallowing and voice change.   Eyes: Negative for redness and visual disturbance.  Respiratory: Negative for cough and shortness of breath.        No changes beyond usual COPD  Cardiovascular: Negative for chest pain and palpitations.  Gastrointestinal: Negative for constipation and diarrhea.  Genitourinary: Negative for difficulty urinating and dysuria.  Skin: Positive for color change.  Neurological: Negative for dizziness and headaches.    BP (!) 102/50   Pulse 77   Temp 99.1 F (37.3 C) (Oral)   Resp (!) 24   Ht 6\' 1"  (1.854 m)   Wt 255 lb (115.7 kg)   SpO2 91%   BMI 33.64 kg/m   Physical Exam  Constitutional: He appears well-developed and well-nourished. No distress.  In  wheelchair.  Mildly disheveled.  Smells of urine  HENT:  Head: Normocephalic and atraumatic.    Right Ear: External ear normal.  Left Ear: External ear normal.  Nose: Mucosal edema, nasal deformity and septal deviation present. No rhinorrhea or sinus tenderness. Right sinus exhibits no maxillary sinus tenderness and no frontal sinus tenderness. Left sinus exhibits no maxillary sinus tenderness and no frontal sinus tenderness.  Mouth/Throat: Abnormal dentition.  The cheeks, the nose as outlined in the drawing are erythematous, indurated, and swollen.  In the left nostril the membranes are swollen shut, turbinates meeting in the middle.  Through the right nostril one can see a couple millimeters of air movement with swollen pale turbinates.  No rhinorrhea or purulence, no bleeding.  The indurated cheeks and nose are not tender to palpation.  No cervical adenopathy. Mouth mucous membranes are dry.  Eyes: Pupils are equal, round, and reactive to light. Conjunctivae  are normal.  Neck: Normal range of motion.  Cardiovascular: Normal rate, regular rhythm and normal heart sounds.  Pulmonary/Chest: Effort normal.  Diminished breath sounds, no wheeze  Musculoskeletal:  Scoots about in wheelchair  Neurological: He is alert.  Psychiatric: He has a normal mood and affect. His behavior is normal.    ASSESSMENT/PLAN:  1. Allergic rhinitis, unspecified seasonality, unspecified trigger The redness and induration of the skin looks like an allergic reaction to some insult.  The only thing I can think of would be the new nasal spray container had a contaminant or preservatives and that he has not previously been exposed to.  2. Rhinitis medicamentosa I discussed with the patient that regular daily use of a nasal decongestant damages the inside of the nasal membranes.  I told him that it is an ordeal to wean yourself off of nasal sprays.  I told him usually it is done with a course of oral steroids, and nasal  steroids, and sometimes oral decongestants.  I explained he cannot take oral decongestants because of his heart.  He states that he has resisted having an ear nose and throat doctor look at him because he is afraid with any surgery that they will pack his nose.  Apparently he does not tolerate any level of nasal congestion.   Patient Instructions  Take the prednisone twice a day Use cold compresses to face Continue the flonase Take zyrtec once a day Return towards end of week for re check  Try NOT to use the decongestant nasal spray   Raylene Everts, MD

## 2017-04-24 ENCOUNTER — Ambulatory Visit (HOSPITAL_COMMUNITY): Payer: Medicare Other

## 2017-04-24 ENCOUNTER — Encounter (HOSPITAL_COMMUNITY)
Admission: RE | Disposition: A | Discharge status: Home / Self Care | Payer: Self-pay | Source: Ambulatory Visit | Attending: General Surgery

## 2017-04-24 ENCOUNTER — Ambulatory Visit (HOSPITAL_COMMUNITY)
Admission: RE | Admit: 2017-04-24 | Discharge: 2017-04-24 | Disposition: A | Discharge status: Home / Self Care | Payer: Medicare Other | Source: Ambulatory Visit | Attending: General Surgery | Admitting: General Surgery

## 2017-04-24 ENCOUNTER — Encounter (HOSPITAL_COMMUNITY): Payer: Self-pay | Admitting: *Deleted

## 2017-04-24 DIAGNOSIS — Z87891 Personal history of nicotine dependence: Secondary | ICD-10-CM | POA: Insufficient documentation

## 2017-04-24 DIAGNOSIS — Z923 Personal history of irradiation: Secondary | ICD-10-CM | POA: Insufficient documentation

## 2017-04-24 DIAGNOSIS — I700 Atherosclerosis of aorta: Secondary | ICD-10-CM | POA: Insufficient documentation

## 2017-04-24 DIAGNOSIS — Z79899 Other long term (current) drug therapy: Secondary | ICD-10-CM | POA: Insufficient documentation

## 2017-04-24 DIAGNOSIS — Z7901 Long term (current) use of anticoagulants: Secondary | ICD-10-CM | POA: Insufficient documentation

## 2017-04-24 DIAGNOSIS — I4891 Unspecified atrial fibrillation: Secondary | ICD-10-CM | POA: Diagnosis not present

## 2017-04-24 DIAGNOSIS — Z95828 Presence of other vascular implants and grafts: Secondary | ICD-10-CM

## 2017-04-24 DIAGNOSIS — Z85118 Personal history of other malignant neoplasm of bronchus and lung: Secondary | ICD-10-CM | POA: Diagnosis not present

## 2017-04-24 DIAGNOSIS — E119 Type 2 diabetes mellitus without complications: Secondary | ICD-10-CM | POA: Insufficient documentation

## 2017-04-24 DIAGNOSIS — J439 Emphysema, unspecified: Secondary | ICD-10-CM | POA: Diagnosis not present

## 2017-04-24 DIAGNOSIS — Z452 Encounter for adjustment and management of vascular access device: Secondary | ICD-10-CM | POA: Insufficient documentation

## 2017-04-24 DIAGNOSIS — C3490 Malignant neoplasm of unspecified part of unspecified bronchus or lung: Secondary | ICD-10-CM | POA: Diagnosis not present

## 2017-04-24 DIAGNOSIS — Z6833 Body mass index (BMI) 33.0-33.9, adult: Secondary | ICD-10-CM | POA: Insufficient documentation

## 2017-04-24 DIAGNOSIS — F329 Major depressive disorder, single episode, unspecified: Secondary | ICD-10-CM | POA: Diagnosis not present

## 2017-04-24 DIAGNOSIS — E669 Obesity, unspecified: Secondary | ICD-10-CM | POA: Diagnosis not present

## 2017-04-24 DIAGNOSIS — J309 Allergic rhinitis, unspecified: Secondary | ICD-10-CM | POA: Diagnosis not present

## 2017-04-24 DIAGNOSIS — I2510 Atherosclerotic heart disease of native coronary artery without angina pectoris: Secondary | ICD-10-CM | POA: Insufficient documentation

## 2017-04-24 DIAGNOSIS — N400 Enlarged prostate without lower urinary tract symptoms: Secondary | ICD-10-CM | POA: Insufficient documentation

## 2017-04-24 DIAGNOSIS — Z794 Long term (current) use of insulin: Secondary | ICD-10-CM | POA: Insufficient documentation

## 2017-04-24 DIAGNOSIS — K760 Fatty (change of) liver, not elsewhere classified: Secondary | ICD-10-CM | POA: Diagnosis not present

## 2017-04-24 DIAGNOSIS — I10 Essential (primary) hypertension: Secondary | ICD-10-CM | POA: Insufficient documentation

## 2017-04-24 DIAGNOSIS — Z8249 Family history of ischemic heart disease and other diseases of the circulatory system: Secondary | ICD-10-CM | POA: Insufficient documentation

## 2017-04-24 DIAGNOSIS — Z9221 Personal history of antineoplastic chemotherapy: Secondary | ICD-10-CM | POA: Diagnosis not present

## 2017-04-24 DIAGNOSIS — Z9889 Other specified postprocedural states: Secondary | ICD-10-CM

## 2017-04-24 DIAGNOSIS — J9811 Atelectasis: Secondary | ICD-10-CM | POA: Diagnosis not present

## 2017-04-24 HISTORY — PX: PORT-A-CATH REMOVAL: SHX5289

## 2017-04-24 LAB — GLUCOSE, CAPILLARY: Glucose-Capillary: 199 mg/dL — ABNORMAL HIGH (ref 65–99)

## 2017-04-24 SURGERY — MINOR REMOVAL PORT-A-CATH
Anesthesia: LOCAL

## 2017-04-24 MED ORDER — LIDOCAINE HCL (PF) 1 % IJ SOLN
INTRAMUSCULAR | Status: AC
  Filled 2017-04-24: qty 30

## 2017-04-24 MED ORDER — ROXICODONE 5 MG PO TABS: 5.0000 mg | ORAL_TABLET | ORAL | 0 refills | Status: DC | PRN

## 2017-04-24 SURGICAL SUPPLY — 24 items
ADH SKN CLS APL DERMABOND .7 (GAUZE/BANDAGES/DRESSINGS) ×1
CHLORAPREP W/TINT 10.5 ML (MISCELLANEOUS) ×2 IMPLANT
CLOTH BEACON ORANGE TIMEOUT ST (SAFETY) ×2 IMPLANT
DECANTER SPIKE VIAL GLASS SM (MISCELLANEOUS) ×2 IMPLANT
DERMABOND ADVANCED (GAUZE/BANDAGES/DRESSINGS) ×1
DERMABOND ADVANCED .7 DNX12 (GAUZE/BANDAGES/DRESSINGS) ×1 IMPLANT
DRAPE PROXIMA HALF (DRAPES) ×2 IMPLANT
ELECT REM PT RETURN 9FT ADLT (ELECTROSURGICAL)
ELECTRODE REM PT RTRN 9FT ADLT (ELECTROSURGICAL) ×1 IMPLANT
GLOVE BIO SURGEON STRL SZ 6.5 (GLOVE) ×2 IMPLANT
GLOVE BIOGEL PI IND STRL 6.5 (GLOVE) ×1 IMPLANT
GLOVE BIOGEL PI IND STRL 7.0 (GLOVE) ×1 IMPLANT
GLOVE BIOGEL PI INDICATOR 6.5 (GLOVE) ×1
GLOVE BIOGEL PI INDICATOR 7.0 (GLOVE) ×1
GOWN STRL REUS W/TWL LRG LVL3 (GOWN DISPOSABLE) ×2 IMPLANT
NDL HYPO 25X1 1.5 SAFETY (NEEDLE) ×1 IMPLANT
NEEDLE HYPO 25X1 1.5 SAFETY (NEEDLE) ×2 IMPLANT
PENCIL HANDSWITCHING (ELECTRODE) ×1 IMPLANT
SPONGE GAUZE 2X2 8PLY STRL LF (GAUZE/BANDAGES/DRESSINGS) ×2 IMPLANT
SUT MNCRL AB 4-0 PS2 18 (SUTURE) ×2 IMPLANT
SUT VIC AB 3-0 SH 27 (SUTURE) ×2
SUT VIC AB 3-0 SH 27X BRD (SUTURE) ×1 IMPLANT
SYR CONTROL 10ML LL (SYRINGE) ×2 IMPLANT
TOWEL OR 17X26 4PK STRL BLUE (TOWEL DISPOSABLE) ×2 IMPLANT

## 2017-04-24 NOTE — Discharge Instructions (Signed)
Discharge Instructions: Shower per your regular routine. Do not submerge in the tub/ swim for 4 weeks.  Take tylenol and ibuprofen as needed for pain control, alternating every 4-6 hours.  Take Roxicodone for breakthrough pain. Take colace for constipation related to narcotic pain medication. Do not pick at the dermabond glue on your incision sites.  You can drive after the procedure as we used no sedation medication.  Restart your Xarelto 04/25/2017.    Implanted Port Removal, Care After Refer to this sheet in the next few weeks. These instructions provide you with information about caring for yourself after your procedure. Your health care provider may also give you more specific instructions. Your treatment has been planned according to current medical practices, but problems sometimes occur. Call your health care provider if you have any problems or questions after your procedure. What can I expect after the procedure? After the procedure, it is common to have:  Soreness or pain near your incision.  Some swelling or bruising near your incision.  Follow these instructions at home: Medicines  Take over-the-counter and prescription medicines only as told by your health care provider. Incision care  Check your incision area every day for signs of infection. Check for: ? More redness, swelling, or pain. ? More fluid or blood. ? Warmth. ? Pus or a bad smell. Activity  Return to your normal activities as told by your health care provider. Ask your health care provider what activities are safe for you.  Until your health care provider says it is safe: ? Do not lift anything that is heavier than 10 lb (4.5 kg) for about 1 week.  ? Do not do activities that involve lifting your arms over your head for about 1 week.  General instructions  Do not use any tobacco products, such as cigarettes, chewing tobacco, and e-cigarettes. Tobacco can delay healing. If you need help quitting, ask your  health care provider.  Keep all follow-up visits as told by your health care provider. This is important. Contact a health care provider if:  You have more redness, swelling, or pain around your incision.  You have more fluid or blood coming from your incision.  Your incision feels warm to the touch.  You have pus or a bad smell coming from your incision.  You have a fever.  You have pain that is not relieved by your pain medicine. Get help right away if:  You have chest pain.  You have difficulty breathing. This information is not intended to replace advice given to you by your health care provider. Make sure you discuss any questions you have with your health care provider. Document Released: 12/20/2014 Document Revised: 06/16/2015 Document Reviewed: 10/13/2014 Elsevier Interactive Patient Education  [employer].

## 2017-04-24 NOTE — Op Note (Signed)
[facility] Operative Note  04/24/17  Preoperative Diagnosis: Port a catheter in place    Postoperative Diagnosis: Same   Procedure(s) Performed: Removal of port    Surgeon: Lanell Matar. Constance Haw, MD   Assistants: No qualified resident was available   Anesthesia: Local anesthesia    Anesthesiologist: No responsible provider has been recorded for the case.    Specimens: None    Estimated Blood Loss: Minimal   Blood Replacement: None    Complications: None   Wound Class: Clean    Operative Indications: [name] is a 67 yo with a history of lung cancer s/p treatment who has had his port in place for about 1 year. He was referred to me for removal by Oncology. He has been doing fair but did have a recent allergic rhinitis diagnosis by his PCP.  Other that this, he has been in his normal state of health.   Findings:Normal internal jugular right sided port a catheter    Procedure: The patient was taken to the procedure room and placed semi-upright position. The right chest was prepped and draped in the normal sterile fashion. Local anesthetic was given to the right chest. The incision over the port was made in the prior scar, and was carried down through the subcutaneous tissue. Using sharp and blunt dissection, the capsule was entered, and the port was freed up.  The catheter was not incorporated in the tunnel, and pressure was held at the internal jugular vein insertion site as it was pulled from the vein.  The catheter and port were removed in their entirety.  Pressure was held for 10 minutes. Final inspection revealed acceptable hemostasis. The pocket was closed with 3-0 Vicryl interrupted and 4-0 running Monocryl and dermabond. The patient tolerated the procedure without issues.    A CXR was obtained following the procedure to verify complete removal and no complications.    Curlene Labrum, MD [facility] [address] [place], [place] [address] [phone] (office)

## 2017-04-25 DIAGNOSIS — Z8249 Family history of ischemic heart disease and other diseases of the circulatory system: Secondary | ICD-10-CM | POA: Diagnosis not present

## 2017-04-25 DIAGNOSIS — J449 Chronic obstructive pulmonary disease, unspecified: Secondary | ICD-10-CM | POA: Diagnosis not present

## 2017-04-25 DIAGNOSIS — Z923 Personal history of irradiation: Secondary | ICD-10-CM | POA: Diagnosis not present

## 2017-04-25 DIAGNOSIS — Z794 Long term (current) use of insulin: Secondary | ICD-10-CM | POA: Diagnosis not present

## 2017-04-25 DIAGNOSIS — R42 Dizziness and giddiness: Secondary | ICD-10-CM | POA: Diagnosis not present

## 2017-04-25 DIAGNOSIS — Z836 Family history of other diseases of the respiratory system: Secondary | ICD-10-CM | POA: Diagnosis not present

## 2017-04-25 DIAGNOSIS — Z7901 Long term (current) use of anticoagulants: Secondary | ICD-10-CM | POA: Diagnosis not present

## 2017-04-25 DIAGNOSIS — I4891 Unspecified atrial fibrillation: Secondary | ICD-10-CM | POA: Diagnosis not present

## 2017-04-25 DIAGNOSIS — I110 Hypertensive heart disease with heart failure: Secondary | ICD-10-CM | POA: Diagnosis not present

## 2017-04-25 DIAGNOSIS — Z85118 Personal history of other malignant neoplasm of bronchus and lung: Secondary | ICD-10-CM | POA: Diagnosis not present

## 2017-04-25 DIAGNOSIS — Z87891 Personal history of nicotine dependence: Secondary | ICD-10-CM | POA: Diagnosis not present

## 2017-04-25 DIAGNOSIS — E1165 Type 2 diabetes mellitus with hyperglycemia: Secondary | ICD-10-CM | POA: Diagnosis not present

## 2017-04-25 DIAGNOSIS — C3411 Malignant neoplasm of upper lobe, right bronchus or lung: Secondary | ICD-10-CM | POA: Diagnosis not present

## 2017-04-25 DIAGNOSIS — I509 Heart failure, unspecified: Secondary | ICD-10-CM | POA: Diagnosis not present

## 2017-04-25 DIAGNOSIS — Z79899 Other long term (current) drug therapy: Secondary | ICD-10-CM | POA: Diagnosis not present

## 2017-04-26 ENCOUNTER — Telehealth: Payer: Self-pay | Admitting: Family Medicine

## 2017-04-26 ENCOUNTER — Ambulatory Visit: Payer: Medicare Other | Admitting: Family Medicine

## 2017-04-26 NOTE — Telephone Encounter (Signed)
Patient no showed for his follow-up appointment this afternoon.  I attempted to call and make sure that he was improving.  I called his home and mobile number with no answer.

## 2017-04-28 DIAGNOSIS — Z79899 Other long term (current) drug therapy: Secondary | ICD-10-CM | POA: Diagnosis not present

## 2017-04-28 DIAGNOSIS — R531 Weakness: Secondary | ICD-10-CM | POA: Diagnosis not present

## 2017-04-28 DIAGNOSIS — J449 Chronic obstructive pulmonary disease, unspecified: Secondary | ICD-10-CM | POA: Diagnosis not present

## 2017-04-28 DIAGNOSIS — J9811 Atelectasis: Secondary | ICD-10-CM | POA: Diagnosis not present

## 2017-04-28 DIAGNOSIS — I4891 Unspecified atrial fibrillation: Secondary | ICD-10-CM | POA: Diagnosis not present

## 2017-04-28 DIAGNOSIS — I110 Hypertensive heart disease with heart failure: Secondary | ICD-10-CM | POA: Diagnosis not present

## 2017-04-28 DIAGNOSIS — I509 Heart failure, unspecified: Secondary | ICD-10-CM | POA: Diagnosis not present

## 2017-04-28 DIAGNOSIS — Z87891 Personal history of nicotine dependence: Secondary | ICD-10-CM | POA: Diagnosis not present

## 2017-04-28 DIAGNOSIS — Z85118 Personal history of other malignant neoplasm of bronchus and lung: Secondary | ICD-10-CM | POA: Diagnosis not present

## 2017-04-28 DIAGNOSIS — Z7901 Long term (current) use of anticoagulants: Secondary | ICD-10-CM | POA: Diagnosis not present

## 2017-04-28 DIAGNOSIS — E119 Type 2 diabetes mellitus without complications: Secondary | ICD-10-CM | POA: Diagnosis not present

## 2017-04-28 DIAGNOSIS — Z794 Long term (current) use of insulin: Secondary | ICD-10-CM | POA: Diagnosis not present

## 2017-04-29 ENCOUNTER — Other Ambulatory Visit: Payer: Self-pay | Admitting: *Deleted

## 2017-04-29 ENCOUNTER — Encounter (HOSPITAL_COMMUNITY): Payer: Self-pay | Admitting: Emergency Medicine

## 2017-04-29 ENCOUNTER — Other Ambulatory Visit: Payer: Self-pay

## 2017-04-29 ENCOUNTER — Encounter: Payer: Self-pay | Admitting: *Deleted

## 2017-04-29 ENCOUNTER — Emergency Department (HOSPITAL_COMMUNITY): Payer: Medicare Other

## 2017-04-29 ENCOUNTER — Ambulatory Visit (INDEPENDENT_AMBULATORY_CARE_PROVIDER_SITE_OTHER): Payer: Medicare Other | Admitting: Gastroenterology

## 2017-04-29 ENCOUNTER — Telehealth: Payer: Self-pay | Admitting: *Deleted

## 2017-04-29 ENCOUNTER — Encounter (HOSPITAL_COMMUNITY): Payer: Self-pay | Admitting: General Surgery

## 2017-04-29 ENCOUNTER — Emergency Department (HOSPITAL_COMMUNITY)
Admission: EM | Admit: 2017-04-29 | Discharge: 2017-04-29 | Disposition: A | Discharge status: Home / Self Care | Payer: Medicare Other | Attending: Emergency Medicine | Admitting: Emergency Medicine

## 2017-04-29 DIAGNOSIS — I110 Hypertensive heart disease with heart failure: Secondary | ICD-10-CM | POA: Diagnosis not present

## 2017-04-29 DIAGNOSIS — I10 Essential (primary) hypertension: Secondary | ICD-10-CM | POA: Insufficient documentation

## 2017-04-29 DIAGNOSIS — E119 Type 2 diabetes mellitus without complications: Secondary | ICD-10-CM | POA: Insufficient documentation

## 2017-04-29 DIAGNOSIS — Z860100 Personal history of colon polyps, unspecified: Secondary | ICD-10-CM

## 2017-04-29 DIAGNOSIS — I4891 Unspecified atrial fibrillation: Secondary | ICD-10-CM | POA: Diagnosis not present

## 2017-04-29 DIAGNOSIS — J441 Chronic obstructive pulmonary disease with (acute) exacerbation: Secondary | ICD-10-CM | POA: Insufficient documentation

## 2017-04-29 DIAGNOSIS — J449 Chronic obstructive pulmonary disease, unspecified: Secondary | ICD-10-CM | POA: Diagnosis not present

## 2017-04-29 DIAGNOSIS — Z79899 Other long term (current) drug therapy: Secondary | ICD-10-CM | POA: Insufficient documentation

## 2017-04-29 DIAGNOSIS — Z87891 Personal history of nicotine dependence: Secondary | ICD-10-CM | POA: Diagnosis not present

## 2017-04-29 DIAGNOSIS — Z85118 Personal history of other malignant neoplasm of bronchus and lung: Secondary | ICD-10-CM | POA: Insufficient documentation

## 2017-04-29 DIAGNOSIS — Z7901 Long term (current) use of anticoagulants: Secondary | ICD-10-CM | POA: Diagnosis not present

## 2017-04-29 DIAGNOSIS — R0602 Shortness of breath: Secondary | ICD-10-CM | POA: Diagnosis not present

## 2017-04-29 DIAGNOSIS — R0600 Dyspnea, unspecified: Secondary | ICD-10-CM | POA: Diagnosis not present

## 2017-04-29 DIAGNOSIS — I509 Heart failure, unspecified: Secondary | ICD-10-CM | POA: Diagnosis not present

## 2017-04-29 DIAGNOSIS — Z794 Long term (current) use of insulin: Secondary | ICD-10-CM | POA: Diagnosis not present

## 2017-04-29 DIAGNOSIS — I4892 Unspecified atrial flutter: Secondary | ICD-10-CM | POA: Diagnosis not present

## 2017-04-29 LAB — CBC WITH DIFFERENTIAL/PLATELET
Basophils Absolute: 0 10*3/uL (ref 0.0–0.1)
Basophils Relative: 0 %
Eosinophils Absolute: 0.1 10*3/uL (ref 0.0–0.7)
Eosinophils Relative: 1 %
HCT: 35.7 % — ABNORMAL LOW (ref 39.0–52.0)
Hemoglobin: 11.7 g/dL — ABNORMAL LOW (ref 13.0–17.0)
Lymphocytes Relative: 7 %
Lymphs Abs: 0.7 10*3/uL (ref 0.7–4.0)
MCH: 33.5 pg (ref 26.0–34.0)
MCHC: 32.8 g/dL (ref 30.0–36.0)
MCV: 102.3 fL — ABNORMAL HIGH (ref 78.0–100.0)
Monocytes Absolute: 0.8 10*3/uL (ref 0.1–1.0)
Monocytes Relative: 8 %
Neutro Abs: 8.1 10*3/uL — ABNORMAL HIGH (ref 1.7–7.7)
Neutrophils Relative %: 84 %
Platelets: 205 10*3/uL (ref 150–400)
RBC: 3.49 MIL/uL — ABNORMAL LOW (ref 4.22–5.81)
RDW: 14.7 % (ref 11.5–15.5)
WBC: 9.7 10*3/uL (ref 4.0–10.5)

## 2017-04-29 LAB — BASIC METABOLIC PANEL
Anion gap: 16 — ABNORMAL HIGH (ref 5–15)
BUN: 18 mg/dL (ref 6–20)
CO2: 25 mmol/L (ref 22–32)
Calcium: 9.3 mg/dL (ref 8.9–10.3)
Chloride: 96 mmol/L — ABNORMAL LOW (ref 101–111)
Creatinine, Ser: 1.12 mg/dL (ref 0.61–1.24)
GFR calc Af Amer: >60 mL/min (ref >60)
GFR calc non Af Amer: >60 mL/min (ref >60)
Glucose, Bld: 283 mg/dL — ABNORMAL HIGH (ref 65–99)
Potassium: 3.7 mmol/L (ref 3.5–5.1)
Sodium: 137 mmol/L (ref 135–145)

## 2017-04-29 LAB — CBG MONITORING, ED: Glucose-Capillary: 282 mg/dL — ABNORMAL HIGH (ref 65–99)

## 2017-04-29 LAB — TROPONIN I: Troponin I: <0.03 ng/mL (ref <0.03)

## 2017-04-29 MED ORDER — PEG 3350-KCL-NABCB-NACL-NASULF 236 G PO SOLR: 4000.0000 mL | Freq: Once | ORAL | 0 refills | Status: AC

## 2017-04-29 MED ORDER — ALBUTEROL (5 MG/ML) CONTINUOUS INHALATION SOLN
10.0000 mg/h | INHALATION_SOLUTION | Freq: Once | RESPIRATORY_TRACT | Status: AC
  Administered 2017-04-29: 10 mg/h via RESPIRATORY_TRACT
  Filled 2017-04-29: qty 20

## 2017-04-29 MED ORDER — METHYLPREDNISOLONE SODIUM SUCC 125 MG IJ SOLR
125.0000 mg | Freq: Once | INTRAMUSCULAR | Status: AC
  Administered 2017-04-29: 125 mg via INTRAVENOUS
  Filled 2017-04-29: qty 2

## 2017-04-29 MED ORDER — PREDNISONE 20 MG PO TABS: 20.0000 mg | ORAL_TABLET | Freq: Two times a day (BID) | ORAL | 0 refills | Status: DC

## 2017-04-29 NOTE — Discharge Instructions (Addendum)
Continue to use your nebulizer at home.  We are giving her a short course of prednisone to help improve your symptoms.  Call your doctor for a follow-up appointment to be seen for a checkup in 3-5 days.

## 2017-04-29 NOTE — Progress Notes (Signed)
Primary Care Physician:  [name], MD  Primary Gastroenterologist:  Garfield Cornea, MD   Chief Complaint  Patient presents with  . Colonoscopy    HPI:  Chase Herrera is a 67 y.o. male here at the request of Dr. Mannie Stabile for colonoscopy. Patient reports h/o precancerous colon polyp removed at time of last colonoscopy at [place] in [place]. He states he is overdue for return colonoscopy. Records are currently unavailable. He wants to have it done here locally.  He denies any problems with constipation, diarrhea, melena, brbpr, abd pain, heartburn, dysphagia, vomiting.   He was treated for small cell carcinoma of the right upper lung last year. Completed chemotherapy 05/2016 with concurrent RT which finished in 06/2016.  Next scans due in 06/2017. Encouraged by oncologist to have colonoscopy as well.   He has multiple comorbidities including DM, COPD, Afib/Aflutter on Xarelto, h/o RUL PNA with sepsis last winter.  He lost about 50 pounds with chemo last year but weight has been stable last 3 months.   Current Outpatient Medications  Medication Sig Dispense Refill  . acetaminophen (TYLENOL) 325 MG tablet Take every 6 (six) hours as needed by mouth.    Marland Kitchen atorvastatin (LIPITOR) 20 MG tablet Take 1 tablet (20 mg total) by mouth daily. 90 tablet 3  . beclomethasone (QVAR) 80 MCG/ACT inhaler Inhale 1 puff into the lungs as needed.     . budesonide-formoterol (SYMBICORT) 160-4.5 MCG/ACT inhaler Inhale into the lungs.    . cetirizine (ZYRTEC) 10 MG tablet Take 1 tablet (10 mg total) by mouth daily. 30 tablet 11  . digoxin (LANOXIN) 0.125 MG tablet Take 1 tablet (125 mcg total) by mouth daily. 90 tablet 1  . diltiazem (CARDIZEM CD) 120 MG 24 hr capsule Take 120 mg by mouth daily.  6  . diltiazem (TIAZAC) 360 MG 24 hr capsule Take 1 capsule (360 mg total) by mouth daily. 90 capsule 1  . fluticasone (FLONASE) 50 MCG/ACT nasal spray Place 2 sprays into both nostrils daily. (Patient taking differently:  Place 2 sprays into both nostrils as needed. ) 16 g 6  . furosemide (LASIX) 40 MG tablet TAKE 1 TABLET BY MOUTH EVERY DAY 90 tablet 0  . gabapentin (NEURONTIN) 400 MG capsule Take 1 capsule (400 mg total) by mouth 3 (three) times daily. 180 capsule 3  . guaiFENesin (MUCINEX) 600 MG 12 hr tablet Take 1,200 mg by mouth as needed.     . insulin glargine (LANTUS) 100 UNIT/ML injection Inject 25 Units into the skin 2 (two) times daily.    Marland Kitchen levalbuterol (XOPENEX) 0.63 MG/3ML nebulizer solution Inhale 3 mLs (0.63 mg total) into the lungs 4 (four) times daily. 3 mL 0  . metFORMIN (GLUCOPHAGE) 500 MG tablet Take 3 tablets (1,500 mg total) by mouth daily with breakfast. 270 tablet 1  . metoprolol tartrate (LOPRESSOR) 50 MG tablet Take 1 tablet (50 mg total) by mouth 2 (two) times daily. 180 tablet 3  . Multiple Vitamins-Minerals (COMPLETE MULTIVITAMIN/MINERAL PO) TAKE 1 TABLET BY MOUTH DAILY  99  . PROAIR HFA 108 (90 Base) MCG/ACT inhaler INHALE 2 PUFFS EVERY FOUR HOURS AS NEEDED FOR WHEEZING 18 g 1  . ROXICODONE 5 MG immediate release tablet Take 1 tablet (5 mg total) by mouth every 4 (four) hours as needed for severe pain or breakthrough pain. 5 tablet 0  . tamsulosin (FLOMAX) 0.4 MG CAPS capsule Take 1 capsule (0.4 mg total) by mouth 2 (two) times daily after a meal. 180 capsule  1  . tiotropium (SPIRIVA HANDIHALER) 18 MCG inhalation capsule Place 1 capsule (18 mcg total) into inhaler and inhale daily. 30 capsule 12  . traZODone (DESYREL) 50 MG tablet Take 50 mg by mouth at bedtime.  5  . predniSONE (DELTASONE) 20 MG tablet Take 1 tablet (20 mg total) by mouth 2 (two) times daily with a meal. (Patient not taking: Reported on 04/29/2017) 10 tablet 0  . rivaroxaban (XARELTO) 20 MG TABS tablet Take 20 mg by mouth daily.      No current facility-administered medications for this visit.     Allergies as of 04/29/2017  . (No Known Allergies)    Past Medical History:  Diagnosis Date  . Alcohol abuse     Quit 08/23/11  . Arthritis   . Atrial fibrillation ([place])   . Atrial flutter ([place])    CTI ablation by Dr [name] 10/2011  . Bacteremia due to Gram-positive bacteria 11/29/2016  . COPD (chronic obstructive pulmonary disease) ([place])   . Depression   . Essential hypertension   . History of cardiomyopathy    LVEF 25-30% 08/2011 with subsequent normalization  . Obesity   . Prostate enlargement   . Small cell lung cancer ([place])    Right upper lobe - follows with [facility]  . Type 2 diabetes mellitus ([place])     Past Surgical History:  Procedure Laterality Date  . APPENDECTOMY    . ATRIAL ABLATION SURGERY  11/06/2011   CTI ablation for atrial flutter by Dr [name]  . ATRIAL FLUTTER ABLATION N/A 11/06/2011   Procedure: ATRIAL FLUTTER ABLATION;  Surgeon: Thompson Grayer, MD;  Location: [facility] CATH LAB;  Service: Cardiovascular;  Laterality: N/A;  . Cataracts    . IR GENERIC HISTORICAL  04/09/2016   IR US GUIDE VASC ACCESS RIGHT 04/09/2016 [name], DO [facility]-INTERV RAD  . IR GENERIC HISTORICAL  04/09/2016   IR FLUORO GUIDE PORT INSERTION RIGHT 04/09/2016 [name], DO [facility]  . PORT-A-CATH REMOVAL N/A 04/24/2017   Procedure: MINOR REMOVAL PORT-A-CATH;  Surgeon: [name], MD;  Location: [facility] ORS;  Service: General;  Laterality: N/A;  . TONSILLECTOMY      Family History  Problem Relation Age of Onset  . Diabetes Sister   . Hypertension Mother   . Lung cancer Mother   . Hypertension Father   . Heart attack Father   . Hypertension Sister   . Hypertension Sister   . Hypertension Sister   . Pulmonary fibrosis Sister     Social History   Socioeconomic History  . Marital status: Single    Spouse name: Not on file  . Number of children: Not on file  . Years of education: Not on file  . Highest education level: Not on file  Occupational History  . Occupation: retired     Comment: Network engineer  . Financial resource strain: Somewhat hard  . Food insecurity:     Worry: Patient refused    Inability: Patient refused  . Transportation needs:    Medical: Patient refused    Non-medical: Patient refused  Tobacco Use  . Smoking status: Former Smoker    Packs/day: 0.50    Years: 47.00    Pack years: 23.50    Types: Cigarettes  . Smokeless tobacco: Former Systems developer    Quit date: 04/10/2014  . Tobacco comment: encouraged to quit today 03/04/12  Substance and Sexual Activity  . Alcohol use: No    Frequency: Never  Comment: quit 3 1/2 years ago  . Drug use: Not Currently    Comment: marajuana occasionally  . Sexual activity: Not Currently  Lifestyle  . Physical activity:    Days per week: Patient refused    Minutes per session: Patient refused  . Stress: To some extent  Relationships  . Social connections:    Talks on phone: Patient refused    Gets together: Patient refused    Attends religious service: Patient refused    Active member of club or organization: Patient refused    Attends meetings of clubs or organizations: Patient refused    Relationship status: Patient refused  . Intimate partner violence:    Fear of current or ex partner: Patient refused    Emotionally abused: Patient refused    Physically abused: Patient refused    Forced sexual activity: Patient refused  Other Topics Concern  . Not on file  Social History Narrative   Lives in [place] alone.   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 Eyes: Negative for vision changes.  ENT: Negative for hoarseness, difficulty swallowing , nasal congestion. CV: Negative for chest pain, angina, palpitations, dyspnea on exertion, peripheral edema.  Respiratory: Negative for dyspnea at rest, +dyspnea on exertion, +cough, +sputum (white), +wheezing.  GI: See history of present illness. GU:  Negative for dysuria, hematuria, urinary incontinence, urinary frequency,  nocturnal urination.  MS: Negative for joint pain, low back pain.  Derm: Negative for rash or itching.  Neuro: Negative for weakness, abnormal sensation, seizure, frequent headaches, memory loss, confusion.  Psych: Negative for anxiety, depression, suicidal ideation, hallucinations.  Endo: Negative for unusual weight change.  Heme: Negative for bruising or bleeding. Allergy: Negative for rash or hives.    Physical Examination:  BP 125/71   Pulse 80   Temp (!) 96.6 F (35.9 C) (Oral)   Ht 6\' 1"  (1.854 m)   Wt 256 lb 9.6 oz (116.4 kg)   BMI 33.85 kg/m    General: chronically ill appearing WM, in no acute distress.  Head: Normocephalic, atraumatic.   Eyes: Conjunctiva pink, no icterus. Mouth: Oropharyngeal mucosa moist and pink , no lesions erythema or exudate. Neck: Supple without thyromegaly, masses, or lymphadenopathy.  Lungs: Clear to auscultation bilaterally.  Heart: Regular rate and rhythm, no murmurs rubs or gallops.  Abdomen: Bowel sounds are normal, nontender, nondistended, no hepatosplenomegaly or masses, no abdominal bruits or    hernia , no rebound or guarding.   Rectal: not performed Extremities: No lower extremity edema. No clubbing or deformities.  Neuro: Alert and oriented x 4 , grossly normal neurologically.  Skin: Warm and dry, no rash or jaundice.   Psych: Alert and cooperative, normal mood and affect.  Labs: Lab Results  Component Value Date   CREATININE 1.35 (H) 03/20/2017   BUN 20 03/20/2017   NA 138 03/20/2017   K 3.9 03/20/2017   CL 100 (L) 03/20/2017   CO2 25 03/20/2017   Lab Results  Component Value Date   ALT 32 03/20/2017   AST 29 03/20/2017   ALKPHOS 47 03/20/2017   BILITOT 0.2 (L) 03/20/2017   Lab Results  Component Value Date   WBC 8.4 03/20/2017   HGB 12.0 (L) 03/20/2017   HCT 38.2 (L) 03/20/2017   MCV 103.0 (H) 03/20/2017   PLT 181 03/20/2017     Imaging Studies:  Dg Chest Port 1 View  Result Date: 04/24/2017 CLINICAL DATA:   Status post Port-A-Cath removal. History of lung malignancy, former smoker. EXAM: PORTABLE CHEST 1 VIEW COMPARISON:  Chest x-ray of April 20, 2017 FINDINGS: The power port catheter has been removed. There is no pneumothorax. There is increased density at the left lung base consistent with a small effusion and probable atelectasis. The interstitial markings elsewhere in both lungs are coarse but this is not new. The cardiac silhouette is enlarged. The pulmonary vascularity is not engorged. IMPRESSION: No post power port catheter removal complication. Chronic bronchitic-smoking related changes. Left basilar atelectasis and probable small pleural effusion. A follow-up PA and lateral chest x-ray would be useful. Electronically Signed   By: David  Martinique M.D.   On: 04/24/2017 10:14

## 2017-04-29 NOTE — ED Triage Notes (Signed)
Pt state was at [facility] today for pre-op appt to have colonscopy. Pt states when he got home he began to feel bad. Complaining of SOB, and not feeling good. NAD noted.

## 2017-04-29 NOTE — ED Notes (Signed)
Pt requesting to be put on 2L oxygen, says he is on that amount at night when he sleeps. Pt O2 sats are 96% on RA. When listening to pt lung sounds, pt says, "if they are clear it's because I just coughed up some sputum".

## 2017-04-29 NOTE — ED Notes (Signed)
Pt saying he wants to be discharged so he can go to [facility]. Pt complaining because the door to room is shut for privacy of him and other patients. Pt overall unsatisfied with amount of time he waited. Pt c/o side rails being put up for safety reasons. Marita [place], RN charge nurse made aware. Dr Eulis Foster aware and reports pt is up for discharge.

## 2017-04-29 NOTE — Telephone Encounter (Signed)
Pre-op scheduled for 05/30/17 at 1:45pm. Called patient to inform of appt but stated he did not feel well and did not want to talk right now. Letter mailed with appt information.

## 2017-04-29 NOTE — Progress Notes (Signed)
cc'ed to pcp °

## 2017-04-29 NOTE — Patient Instructions (Signed)
1. Colonoscopy as scheduled. See separate instructions.  

## 2017-04-29 NOTE — Assessment & Plan Note (Signed)
67 year old gentleman presenting for surveillance colonoscopy.  He reports history of precancerous polyp removed at time of colonoscopy through the [place] system, reporting that he is overdue for his follow-up colonoscopy.  Records have been requested but not received.  He believes his last colonoscopy was over 5 years ago.  He is currently on Xarelto for history of A. fib/A flutter.  Plan to hold for 48 hours prior to procedure.  Otherwise update colonoscopy in the near future, deep sedation plan given polypharmacy.  I have discussed the risks, alternatives, benefits with regards to but not limited to the risk of reaction to medication, bleeding, infection, perforation and the patient is agreeable to proceed. Written consent to be obtained.  Of note patient has had anemia in the setting of chemotherapy with improvement when last checked in February. Being followed by hem/onc.

## 2017-04-29 NOTE — ED Provider Notes (Signed)
[facility] EMERGENCY DEPARTMENT Provider Note   CSN: [id number] Arrival date & time: 04/29/17  1445     History   Chief Complaint Chief Complaint  Patient presents with  . Shortness of Breath    HPI Chase Herrera is a 67 y.o. male.  He presents for evaluation of shortness of breath and a feeling of ill-ease.  He began to feel like this this morning.  Today he went to see a GI doctor for scheduling of a colonoscopy to evaluate for history of adenomatous polyps.  He denies fever, chills, nausea, vomiting, diarrhea or dysuria.  He has an occasional productive cough.  He stopped smoking 3-1/2 years ago.  He uses nebulizers at home.  No recent sick contacts or illnesses.  There are no other known modifying factors.  HPI  Past Medical History:  Diagnosis Date  . Alcohol abuse    Quit 08/23/11  . Arthritis   . Atrial fibrillation ([place])   . Atrial flutter ([place])    CTI ablation by Dr [name] 10/2011  . Bacteremia due to Gram-positive bacteria 11/29/2016  . COPD (chronic obstructive pulmonary disease) ([place])   . Depression   . Essential hypertension   . History of cardiomyopathy    LVEF 25-30% 08/2011 with subsequent normalization  . Obesity   . Prostate enlargement   . Small cell lung cancer ([place])    Right upper lobe - follows with [facility]  . Type 2 diabetes mellitus [facility])     Patient Active Problem List   Diagnosis Date Noted  . H/O adenomatous polyp of colon 04/29/2017  . Port-A-Cath in place   . Malignant neoplasm of lung ([place])   . Aortic atherosclerosis ([place]) 04/23/2017  . Hepatic steatosis 04/23/2017  . Type 2 diabetes mellitus ([place])   . Essential hypertension   . Atrial fibrillation ([place]) 11/29/2016  . Diabetes mellitus type 2 in obese ([place]) 11/29/2016  . Healthcare-associated pneumonia 11/29/2016  . Small cell lung cancer ([place]) 04/03/2016  . COPD (chronic obstructive pulmonary disease) ([place]) 02/08/2012  . Atrial flutter ([place]) 01/06/2012  . Nonischemic  cardiomyopathy ([place]) 10/15/2011  . Tobacco abuse 10/15/2011    Past Surgical History:  Procedure Laterality Date  . APPENDECTOMY    . ATRIAL ABLATION SURGERY  11/06/2011   CTI ablation for atrial flutter by Dr [name]  . ATRIAL FLUTTER ABLATION N/A 11/06/2011   Procedure: ATRIAL FLUTTER ABLATION;  Surgeon: Thompson Grayer, MD;  Location: [facility] CATH LAB;  Service: Cardiovascular;  Laterality: N/A;  . Cataracts    . IR GENERIC HISTORICAL  04/09/2016   IR US GUIDE VASC ACCESS RIGHT 04/09/2016 [name], DO [facility]-INTERV RAD  . IR GENERIC HISTORICAL  04/09/2016   IR FLUORO GUIDE PORT INSERTION RIGHT 04/09/2016 [name], DO [facility]  . PORT-A-CATH REMOVAL N/A 04/24/2017   Procedure: MINOR REMOVAL PORT-A-CATH;  Surgeon: [name], MD;  Location: [facility] ORS;  Service: General;  Laterality: N/A;  . TONSILLECTOMY          Home Medications    Prior to Admission medications   Medication Sig Start Date End Date Taking? Authorizing Provider  acetaminophen (TYLENOL) 325 MG tablet Take every 6 (six) hours as needed by mouth.    [provider]  atorvastatin (LIPITOR) 20 MG tablet Take 1 tablet (20 mg total) by mouth daily. 01/03/17   [name], MD  beclomethasone (QVAR) 80 MCG/ACT inhaler Inhale 1 puff into the lungs as needed.     [provider]  budesonide-formoterol (SYMBICORT) 160-4.5 MCG/ACT inhaler Inhale into the lungs. 08/28/16   [provider]  cetirizine (ZYRTEC) 10 MG tablet Take 1 tablet (10 mg total) by mouth daily. 04/23/17   Raylene Everts, MD  digoxin (LANOXIN) 0.125 MG tablet Take 1 tablet (125 mcg total) by mouth daily. 01/03/17   [name], MD  diltiazem (CARDIZEM CD) 120 MG 24 hr capsule Take 120 mg by mouth daily. 02/26/17   [provider]  diltiazem (TIAZAC) 360 MG 24 hr capsule Take 1 capsule (360 mg total) by mouth daily. 01/03/17   [name], MD  fluticasone (FLONASE) 50 MCG/ACT nasal spray Place 2 sprays into both  nostrils daily. Patient taking differently: Place 2 sprays into both nostrils as needed.  03/06/17   [name], MD  furosemide (LASIX) 40 MG tablet TAKE 1 TABLET BY MOUTH EVERY DAY 01/17/17   [name], MD  gabapentin (NEURONTIN) 400 MG capsule Take 1 capsule (400 mg total) by mouth 3 (three) times daily. 01/03/17   [name], MD  guaiFENesin (MUCINEX) 600 MG 12 hr tablet Take 1,200 mg by mouth as needed.     [provider]  insulin glargine (LANTUS) 100 UNIT/ML injection Inject 25 Units into the skin 2 (two) times daily.    [provider]  levalbuterol (XOPENEX) 0.63 MG/3ML nebulizer solution Inhale 3 mLs (0.63 mg total) into the lungs 4 (four) times daily. 01/03/17   [name], MD  metFORMIN (GLUCOPHAGE) 500 MG tablet Take 3 tablets (1,500 mg total) by mouth daily with breakfast. 01/03/17   [name], MD  metoprolol tartrate (LOPRESSOR) 50 MG tablet Take 1 tablet (50 mg total) by mouth 2 (two) times daily. 01/03/17 07/02/17  [name], MD  Multiple Vitamins-Minerals (COMPLETE MULTIVITAMIN/MINERAL PO) TAKE 1 TABLET BY MOUTH DAILY 03/05/17   [provider]  polyethylene glycol (GOLYTELY) 236 g solution Take 4,000 mLs by mouth once for 1 dose. 04/29/17 04/29/17  Rourk, Cristopher Estimable, MD  predniSONE (DELTASONE) 20 MG tablet Take 1 tablet (20 mg total) by mouth 2 (two) times daily. 04/29/17   Daleen Bo, MD  PROAIR HFA 108 [phone] Base) MCG/ACT inhaler INHALE 2 PUFFS EVERY FOUR HOURS AS NEEDED FOR WHEEZING 04/16/17   [name], MD  rivaroxaban (XARELTO) 20 MG TABS tablet Take 20 mg by mouth daily.  02/26/17 03/28/17  [provider]  ROXICODONE 5 MG immediate release tablet Take 1 tablet (5 mg total) by mouth every 4 (four) hours as needed for severe pain or breakthrough pain. 04/24/17 04/24/18  [name], MD  tamsulosin (FLOMAX) 0.4 MG CAPS capsule Take 1 capsule (0.4 mg total) by mouth 2 (two) times daily after a meal. 01/03/17   [name], MD  tiotropium (SPIRIVA HANDIHALER) 18 MCG inhalation capsule Place 1 capsule (18 mcg total) into inhaler and inhale daily. 03/06/17   [name], MD  traZODone (DESYREL) 50 MG tablet Take 50 mg by mouth at bedtime. 04/04/17   [provider]    Family History Family History  Problem Relation Age of Onset  . Diabetes Sister   . Hypertension Mother   . Lung cancer Mother   . Hypertension Father   . Heart attack Father   . Hypertension Sister   . Hypertension Sister   . Hypertension Sister   . Pulmonary fibrosis Sister   . Breast cancer Cousin   . Colon cancer Neg Hx     Social History Social History   Tobacco Use  .  Smoking status: Former Smoker    Packs/day: 0.50    Years: 47.00    Pack years: 23.50    Types: Cigarettes  . Smokeless tobacco: Former Systems developer    Quit date: 04/10/2014  . Tobacco comment: encouraged to quit today 03/04/12  Substance Use Topics  . Alcohol use: No    Frequency: Never    Comment: quit 3 1/2 years ago  . Drug use: Not Currently    Comment: marajuana occasionally     Allergies   Patient has no known allergies.   Review of Systems Review of Systems  All other systems reviewed and are negative.    Physical Exam Updated Vital Signs BP 112/66   Pulse (!) 121   Temp 98.6 F (37 C) (Oral)   Resp 18   SpO2 98%   Physical Exam  Constitutional: He is oriented to person, place, and time. He appears well-developed and well-nourished. He does not appear ill.  HENT:  Head: Normocephalic and atraumatic.  Right Ear: External ear normal.  Left Ear: External ear normal.  Eyes: Pupils are equal, round, and reactive to light. Conjunctivae and EOM are normal.  Neck: Normal range of motion and phonation normal. Neck supple.  Cardiovascular: Normal rate, regular rhythm and normal heart sounds.  Pulmonary/Chest: Effort normal. He has decreased breath sounds in the right lower field and the left lower field. He has wheezes in the  right middle field and the left middle field. He has rhonchi in the right lower field and the left lower field. He has rales in the right lower field and the left lower field. He exhibits no bony tenderness.  Abdominal: Soft. There is no tenderness.  Musculoskeletal: Normal range of motion.  Neurological: He is alert and oriented to person, place, and time. No cranial nerve deficit or sensory deficit. He exhibits normal muscle tone. Coordination normal.  Skin: Skin is warm, dry and intact.  Psychiatric: He has a normal mood and affect. His behavior is normal. Judgment and thought content normal.  Nursing note and vitals reviewed.    ED Treatments / Results  Labs (all labs ordered are listed, but only abnormal results are displayed) Labs Reviewed  BASIC METABOLIC PANEL - Abnormal; Notable for the following components:      Result Value   Chloride 96 (*)    Glucose, Bld 283 (*)    Anion gap 16 (*)    All other components within normal limits  CBC WITH DIFFERENTIAL/PLATELET - Abnormal; Notable for the following components:   RBC 3.49 (*)    Hemoglobin 11.7 (*)    HCT 35.7 (*)    MCV 102.3 (*)    Neutro Abs 8.1 (*)    All other components within normal limits  CBG MONITORING, ED - Abnormal; Notable for the following components:   Glucose-Capillary 282 (*)    All other components within normal limits  TROPONIN I    EKG EKG Interpretation  Date/Time:  Monday April 29 2017 15:15:11 EDT Ventricular Rate:  99 PR Interval:    QRS Duration: 88 QT Interval:  378 QTC Calculation: 485 R Axis:   78 Text Interpretation:  Atrial flutter with variable A-V block with premature ventricular or aberrantly conducted complexes Anteroseptal infarct , age undetermined Abnormal ECG since last tracing no significant change Confirmed by Daleen Bo [id number]) on 04/29/2017 5:27:20 PM   Radiology Dg Chest 2 View  Result Date: 04/29/2017 CLINICAL DATA:  Shortness of breath, former smoker, COPD,  hypertension, atrial  fibrillation/flutter, small cell lung cancer EXAM: CHEST - 2 VIEW COMPARISON:  04/28/2017 FINDINGS: Normal heart size, mediastinal contours and pulmonary vascularity. Scarring in RIGHT upper lobe and LEFT lung base stable. Probable LEFT nipple shadow not seen on multiple recent exams or recent CT. Central peribronchial thickening consistent with bronchitis. No acute infiltrate, pleural effusion or pneumothorax. Bones unremarkable. IMPRESSION: Bronchitic changes with scarring in RIGHT upper lobe and at LEFT base. Probable LEFT nipple shadow. Electronically Signed   By: Lavonia Dana M.D.   On: 04/29/2017 15:59    Procedures Procedures (including critical care time)  Medications Ordered in ED Medications  methylPREDNISolone sodium succinate (SOLU-MEDROL) 125 mg/2 mL injection 125 mg (125 mg Intravenous Given 04/29/17 2039)  albuterol (PROVENTIL,VENTOLIN) solution continuous neb (10 mg/hr Nebulization Given 04/29/17 2043)     Initial Impression / Assessment and Plan / ED Course  I have reviewed the triage vital signs and the nursing notes.  Pertinent labs & imaging results that were available during my care of the patient were reviewed by me and considered in my medical decision making (see chart for details).      Patient Vitals for the past 24 hrs:  BP Temp Temp src Pulse Resp SpO2  04/29/17 2100 112/66 - - (!) 121 18 98 %  04/29/17 2043 - - - - - 99 %  04/29/17 2015 124/66 - - (!) 114 18 98 %  04/29/17 2005 - - - (!) 113 20 96 %  04/29/17 1517 140/84 98.6 F (37 C) Oral (!) 107 - 95 %   9:27 PM Reevaluation with update and discussion. After initial assessment and treatment, an updated evaluation reveals patient from nursing that he was ready to go home.  At this time patient is alert and cooperative.  He is not in respiratory distress.  Heart rate is 95-101, irregular.  Findings discussed and questions answered. [place] decision making-evaluation  consistent with bronchitis with mild COPD exacerbation.  Doubt pneumonia, congestive heart failure, metabolic instability or impending vascular collapse.  Nursing Notes Reviewed/ Care Coordinated Applicable Imaging Reviewed Interpretation of Laboratory Data incorporated into ED treatment  The patient appears reasonably screened and/or stabilized for discharge and I doubt any other medical condition or other [facility] requiring further screening, evaluation, or treatment in the ED at this time prior to discharge.  Plan: Home Medications-continue usual medications; Home Treatments-rest, fluids; return here if the recommended treatment, does not improve the symptoms; Recommended follow up-PCP follow-up as needed.    Final Clinical Impressions(s) / ED Diagnoses   Final diagnoses:  COPD exacerbation [facility])    ED Discharge Orders        Ordered    predniSONE (DELTASONE) 20 MG tablet  2 times daily     04/29/17 2123       Daleen Bo, MD 04/29/17 2133

## 2017-04-30 DIAGNOSIS — R079 Chest pain, unspecified: Secondary | ICD-10-CM | POA: Diagnosis not present

## 2017-04-30 DIAGNOSIS — C3490 Malignant neoplasm of unspecified part of unspecified bronchus or lung: Secondary | ICD-10-CM | POA: Diagnosis not present

## 2017-04-30 DIAGNOSIS — Z923 Personal history of irradiation: Secondary | ICD-10-CM | POA: Diagnosis not present

## 2017-04-30 DIAGNOSIS — Z8249 Family history of ischemic heart disease and other diseases of the circulatory system: Secondary | ICD-10-CM | POA: Diagnosis not present

## 2017-04-30 DIAGNOSIS — R262 Difficulty in walking, not elsewhere classified: Secondary | ICD-10-CM | POA: Diagnosis not present

## 2017-04-30 DIAGNOSIS — I110 Hypertensive heart disease with heart failure: Secondary | ICD-10-CM | POA: Diagnosis not present

## 2017-04-30 DIAGNOSIS — I4891 Unspecified atrial fibrillation: Secondary | ICD-10-CM | POA: Diagnosis not present

## 2017-04-30 DIAGNOSIS — Z87891 Personal history of nicotine dependence: Secondary | ICD-10-CM | POA: Diagnosis not present

## 2017-04-30 DIAGNOSIS — Z836 Family history of other diseases of the respiratory system: Secondary | ICD-10-CM | POA: Diagnosis not present

## 2017-04-30 DIAGNOSIS — R0789 Other chest pain: Secondary | ICD-10-CM | POA: Diagnosis not present

## 2017-04-30 DIAGNOSIS — Z7901 Long term (current) use of anticoagulants: Secondary | ICD-10-CM | POA: Diagnosis not present

## 2017-04-30 DIAGNOSIS — E1165 Type 2 diabetes mellitus with hyperglycemia: Secondary | ICD-10-CM | POA: Diagnosis not present

## 2017-04-30 DIAGNOSIS — Z794 Long term (current) use of insulin: Secondary | ICD-10-CM | POA: Diagnosis not present

## 2017-04-30 DIAGNOSIS — J449 Chronic obstructive pulmonary disease, unspecified: Secondary | ICD-10-CM | POA: Diagnosis not present

## 2017-04-30 DIAGNOSIS — E119 Type 2 diabetes mellitus without complications: Secondary | ICD-10-CM | POA: Diagnosis not present

## 2017-04-30 DIAGNOSIS — R29898 Other symptoms and signs involving the musculoskeletal system: Secondary | ICD-10-CM | POA: Diagnosis not present

## 2017-04-30 DIAGNOSIS — E860 Dehydration: Secondary | ICD-10-CM | POA: Diagnosis not present

## 2017-04-30 DIAGNOSIS — Z79899 Other long term (current) drug therapy: Secondary | ICD-10-CM | POA: Diagnosis not present

## 2017-04-30 DIAGNOSIS — I509 Heart failure, unspecified: Secondary | ICD-10-CM | POA: Diagnosis not present

## 2017-04-30 DIAGNOSIS — Z85118 Personal history of other malignant neoplasm of bronchus and lung: Secondary | ICD-10-CM | POA: Diagnosis not present

## 2017-04-30 DIAGNOSIS — R530 Neoplastic (malignant) related fatigue: Secondary | ICD-10-CM | POA: Diagnosis not present

## 2017-04-30 MED ORDER — PEG 3350-KCL-NA BICARB-NACL 420 G PO SOLR: 4000.0000 mL | Freq: Once | ORAL | 0 refills | Status: AC

## 2017-04-30 NOTE — Addendum Note (Signed)
Addended by: Inge Rise on: 04/30/2017 08:07 AM   Modules accepted: Orders

## 2017-04-30 NOTE — Telephone Encounter (Signed)
[facility] [place] and was advised since they did not send patient over to get scheduled for TCS, he will have to have his prep sent to his local pharmacy. They only will supply the prep to patient if referred patient over.  Called patient and made him aware of what the [place] informed me. Rx sent to [facility] drug.

## 2017-05-01 DIAGNOSIS — C3411 Malignant neoplasm of upper lobe, right bronchus or lung: Secondary | ICD-10-CM | POA: Diagnosis not present

## 2017-05-01 DIAGNOSIS — C7931 Secondary malignant neoplasm of brain: Secondary | ICD-10-CM | POA: Diagnosis not present

## 2017-05-01 DIAGNOSIS — I509 Heart failure, unspecified: Secondary | ICD-10-CM | POA: Diagnosis not present

## 2017-05-01 DIAGNOSIS — Z510 Encounter for antineoplastic radiation therapy: Secondary | ICD-10-CM | POA: Diagnosis not present

## 2017-05-02 ENCOUNTER — Other Ambulatory Visit: Payer: Self-pay

## 2017-05-02 ENCOUNTER — Other Ambulatory Visit: Payer: Self-pay | Admitting: Family Medicine

## 2017-05-02 MED ORDER — INSULIN GLARGINE 100 UNIT/ML ~~LOC~~ SOLN: 25.0000 [IU] | Freq: Two times a day (BID) | SUBCUTANEOUS | 0 refills | Status: DC

## 2017-05-03 DIAGNOSIS — R29898 Other symptoms and signs involving the musculoskeletal system: Secondary | ICD-10-CM | POA: Diagnosis not present

## 2017-05-03 DIAGNOSIS — R262 Difficulty in walking, not elsewhere classified: Secondary | ICD-10-CM | POA: Diagnosis not present

## 2017-05-03 DIAGNOSIS — Z794 Long term (current) use of insulin: Secondary | ICD-10-CM | POA: Diagnosis not present

## 2017-05-03 DIAGNOSIS — E119 Type 2 diabetes mellitus without complications: Secondary | ICD-10-CM | POA: Diagnosis not present

## 2017-05-03 DIAGNOSIS — I4891 Unspecified atrial fibrillation: Secondary | ICD-10-CM | POA: Diagnosis not present

## 2017-05-03 DIAGNOSIS — C3490 Malignant neoplasm of unspecified part of unspecified bronchus or lung: Secondary | ICD-10-CM | POA: Diagnosis not present

## 2017-05-03 DIAGNOSIS — R530 Neoplastic (malignant) related fatigue: Secondary | ICD-10-CM | POA: Diagnosis not present

## 2017-05-03 DIAGNOSIS — J449 Chronic obstructive pulmonary disease, unspecified: Secondary | ICD-10-CM | POA: Diagnosis not present

## 2017-05-06 DIAGNOSIS — C3411 Malignant neoplasm of upper lobe, right bronchus or lung: Secondary | ICD-10-CM | POA: Diagnosis not present

## 2017-05-07 DIAGNOSIS — C3490 Malignant neoplasm of unspecified part of unspecified bronchus or lung: Secondary | ICD-10-CM | POA: Diagnosis not present

## 2017-05-07 DIAGNOSIS — J449 Chronic obstructive pulmonary disease, unspecified: Secondary | ICD-10-CM | POA: Diagnosis not present

## 2017-05-07 DIAGNOSIS — E119 Type 2 diabetes mellitus without complications: Secondary | ICD-10-CM | POA: Diagnosis not present

## 2017-05-07 DIAGNOSIS — R262 Difficulty in walking, not elsewhere classified: Secondary | ICD-10-CM | POA: Diagnosis not present

## 2017-05-07 DIAGNOSIS — R530 Neoplastic (malignant) related fatigue: Secondary | ICD-10-CM | POA: Diagnosis not present

## 2017-05-07 DIAGNOSIS — R29898 Other symptoms and signs involving the musculoskeletal system: Secondary | ICD-10-CM | POA: Diagnosis not present

## 2017-05-07 DIAGNOSIS — I4891 Unspecified atrial fibrillation: Secondary | ICD-10-CM | POA: Diagnosis not present

## 2017-05-07 DIAGNOSIS — Z794 Long term (current) use of insulin: Secondary | ICD-10-CM | POA: Diagnosis not present

## 2017-05-08 DIAGNOSIS — I6782 Cerebral ischemia: Secondary | ICD-10-CM | POA: Diagnosis not present

## 2017-05-08 DIAGNOSIS — G319 Degenerative disease of nervous system, unspecified: Secondary | ICD-10-CM | POA: Diagnosis not present

## 2017-05-08 DIAGNOSIS — R51 Headache: Secondary | ICD-10-CM | POA: Diagnosis not present

## 2017-05-08 DIAGNOSIS — C3411 Malignant neoplasm of upper lobe, right bronchus or lung: Secondary | ICD-10-CM | POA: Diagnosis not present

## 2017-05-10 DIAGNOSIS — R262 Difficulty in walking, not elsewhere classified: Secondary | ICD-10-CM | POA: Diagnosis not present

## 2017-05-10 DIAGNOSIS — E119 Type 2 diabetes mellitus without complications: Secondary | ICD-10-CM | POA: Diagnosis not present

## 2017-05-10 DIAGNOSIS — Z794 Long term (current) use of insulin: Secondary | ICD-10-CM | POA: Diagnosis not present

## 2017-05-10 DIAGNOSIS — C3490 Malignant neoplasm of unspecified part of unspecified bronchus or lung: Secondary | ICD-10-CM | POA: Diagnosis not present

## 2017-05-10 DIAGNOSIS — I4891 Unspecified atrial fibrillation: Secondary | ICD-10-CM | POA: Diagnosis not present

## 2017-05-10 DIAGNOSIS — R29898 Other symptoms and signs involving the musculoskeletal system: Secondary | ICD-10-CM | POA: Diagnosis not present

## 2017-05-10 DIAGNOSIS — J449 Chronic obstructive pulmonary disease, unspecified: Secondary | ICD-10-CM | POA: Diagnosis not present

## 2017-05-10 DIAGNOSIS — R530 Neoplastic (malignant) related fatigue: Secondary | ICD-10-CM | POA: Diagnosis not present

## 2017-05-14 DIAGNOSIS — Z794 Long term (current) use of insulin: Secondary | ICD-10-CM | POA: Diagnosis not present

## 2017-05-14 DIAGNOSIS — R262 Difficulty in walking, not elsewhere classified: Secondary | ICD-10-CM | POA: Diagnosis not present

## 2017-05-14 DIAGNOSIS — J449 Chronic obstructive pulmonary disease, unspecified: Secondary | ICD-10-CM | POA: Diagnosis not present

## 2017-05-14 DIAGNOSIS — E119 Type 2 diabetes mellitus without complications: Secondary | ICD-10-CM | POA: Diagnosis not present

## 2017-05-14 DIAGNOSIS — R530 Neoplastic (malignant) related fatigue: Secondary | ICD-10-CM | POA: Diagnosis not present

## 2017-05-14 DIAGNOSIS — C3490 Malignant neoplasm of unspecified part of unspecified bronchus or lung: Secondary | ICD-10-CM | POA: Diagnosis not present

## 2017-05-14 DIAGNOSIS — I4891 Unspecified atrial fibrillation: Secondary | ICD-10-CM | POA: Diagnosis not present

## 2017-05-14 DIAGNOSIS — R29898 Other symptoms and signs involving the musculoskeletal system: Secondary | ICD-10-CM | POA: Diagnosis not present

## 2017-05-15 ENCOUNTER — Other Ambulatory Visit: Payer: Self-pay | Admitting: Family Medicine

## 2017-05-15 DIAGNOSIS — I482 Chronic atrial fibrillation, unspecified: Secondary | ICD-10-CM

## 2017-05-16 ENCOUNTER — Other Ambulatory Visit: Payer: Self-pay | Admitting: Family Medicine

## 2017-05-16 DIAGNOSIS — I482 Chronic atrial fibrillation, unspecified: Secondary | ICD-10-CM

## 2017-05-17 DIAGNOSIS — C3490 Malignant neoplasm of unspecified part of unspecified bronchus or lung: Secondary | ICD-10-CM | POA: Diagnosis not present

## 2017-05-17 DIAGNOSIS — R530 Neoplastic (malignant) related fatigue: Secondary | ICD-10-CM | POA: Diagnosis not present

## 2017-05-17 DIAGNOSIS — E119 Type 2 diabetes mellitus without complications: Secondary | ICD-10-CM | POA: Diagnosis not present

## 2017-05-17 DIAGNOSIS — J449 Chronic obstructive pulmonary disease, unspecified: Secondary | ICD-10-CM | POA: Diagnosis not present

## 2017-05-17 DIAGNOSIS — Z794 Long term (current) use of insulin: Secondary | ICD-10-CM | POA: Diagnosis not present

## 2017-05-17 DIAGNOSIS — R262 Difficulty in walking, not elsewhere classified: Secondary | ICD-10-CM | POA: Diagnosis not present

## 2017-05-17 DIAGNOSIS — R29898 Other symptoms and signs involving the musculoskeletal system: Secondary | ICD-10-CM | POA: Diagnosis not present

## 2017-05-17 DIAGNOSIS — I4891 Unspecified atrial fibrillation: Secondary | ICD-10-CM | POA: Diagnosis not present

## 2017-05-21 DIAGNOSIS — R29898 Other symptoms and signs involving the musculoskeletal system: Secondary | ICD-10-CM | POA: Diagnosis not present

## 2017-05-21 DIAGNOSIS — I4891 Unspecified atrial fibrillation: Secondary | ICD-10-CM | POA: Diagnosis not present

## 2017-05-21 DIAGNOSIS — E119 Type 2 diabetes mellitus without complications: Secondary | ICD-10-CM | POA: Diagnosis not present

## 2017-05-21 DIAGNOSIS — J449 Chronic obstructive pulmonary disease, unspecified: Secondary | ICD-10-CM | POA: Diagnosis not present

## 2017-05-21 DIAGNOSIS — R262 Difficulty in walking, not elsewhere classified: Secondary | ICD-10-CM | POA: Diagnosis not present

## 2017-05-21 DIAGNOSIS — Z794 Long term (current) use of insulin: Secondary | ICD-10-CM | POA: Diagnosis not present

## 2017-05-21 DIAGNOSIS — R530 Neoplastic (malignant) related fatigue: Secondary | ICD-10-CM | POA: Diagnosis not present

## 2017-05-21 DIAGNOSIS — C3490 Malignant neoplasm of unspecified part of unspecified bronchus or lung: Secondary | ICD-10-CM | POA: Diagnosis not present

## 2017-05-22 ENCOUNTER — Telehealth: Payer: Self-pay | Admitting: Family Medicine

## 2017-05-22 DIAGNOSIS — J439 Emphysema, unspecified: Secondary | ICD-10-CM | POA: Diagnosis not present

## 2017-05-22 MED ORDER — RIVAROXABAN 20 MG PO TABS: 20.0000 mg | ORAL_TABLET | Freq: Every day | ORAL | 0 refills | Status: DC

## 2017-05-22 NOTE — Telephone Encounter (Signed)
I called patient and explained to him that Xarelto 15 mg was what was requested by the pharmacy, therefore, that's what was sent it. I corrected it and sent in the 20 mg.

## 2017-05-22 NOTE — Progress Notes (Signed)
Cardiology Office Note  Date: 05/24/2017   ID: Chase Herrera, Chase Herrera 1950-02-22, MRN [id number]  PCP: Chase Macadam, MD  Primary Cardiologist: Chase Lesches, MD   Chief Complaint  Patient presents with  . Atrial Flutter    History of Present Illness: Chase Herrera is a 67 y.o. male last seen in January.  He presents for a routine follow-up visit.  I reviewed interval records, he was seen by Dr. Willis Herrera regarding treatment options for atrial flutter and it was decided that device placement and/or ablation were not indicated at this point with plan for rate control strategy.  He presents today stating that he has felt about the same, no progressive shortness of breath, no palpitations.  He states that the fast as he has noticed his heart rate to be is in the low 100s.  Current cardiac regimen includes Xarelto, Lanoxin, Cardizem CD, and Lopressor.  He has had no dizziness or syncope.  Past Medical History:  Diagnosis Date  . Alcohol abuse    Quit 08/23/11  . Arthritis   . Atrial fibrillation ([place])   . Atrial flutter ([place])    CTI ablation by Dr Chase Herrera 10/2011  . Bacteremia due to Gram-positive bacteria 11/29/2016  . COPD (chronic obstructive pulmonary disease) ([place])   . Depression   . Essential hypertension   . History of cardiomyopathy    LVEF 25-30% 08/2011 with subsequent normalization  . Obesity   . Prostate enlargement   . Small cell lung cancer ([place])    Right upper lobe - follows with [facility]  . Type 2 diabetes mellitus ([place])     Past Surgical History:  Procedure Laterality Date  . APPENDECTOMY    . ATRIAL ABLATION SURGERY  11/06/2011   CTI ablation for atrial flutter by Dr Chase Herrera  . ATRIAL FLUTTER ABLATION N/A 11/06/2011   Procedure: ATRIAL FLUTTER ABLATION;  Surgeon: Chase Grayer, MD;  Location: [facility] CATH LAB;  Service: Cardiovascular;  Laterality: N/A;  . Cataracts    . IR GENERIC HISTORICAL  04/09/2016   IR US GUIDE VASC ACCESS RIGHT 04/09/2016 Chase Mckusick,  DO [facility]INTER[facility]  . IR GENERIC HISTORICAL  04/09/2016   IR FLUORO GUIDE PORT INSERTION RIGHT 04/09/2016 Chase Mckusick, DO [facility]  . PORT-A-CATH REMOVAL N/A 04/24/2017   Procedure: MINOR REMOVAL PORT-A-CATH;  Surgeon: Chase Cagey, MD;  Location: [facility] ORS;  Service: General;  Laterality: N/A;  . TONSILLECTOMY      Current Outpatient Medications  Medication Sig Dispense Refill  . acetaminophen (TYLENOL) 325 MG tablet Take every 6 (six) hours as needed by mouth.    Marland Kitchen atorvastatin (LIPITOR) 20 MG tablet Take 1 tablet (20 mg total) by mouth daily. 90 tablet 3  . beclomethasone (QVAR) 80 MCG/ACT inhaler Inhale 1 puff into the lungs as needed.     . budesonide-formoterol (SYMBICORT) 160-4.5 MCG/ACT inhaler Inhale into the lungs.    . cetirizine (ZYRTEC) 10 MG tablet Take 1 tablet (10 mg total) by mouth daily. 30 tablet 11  . digoxin (LANOXIN) 0.125 MG tablet Take 1 tablet (125 mcg total) by mouth daily. 90 tablet 1  . diltiazem (CARDIZEM CD) 120 MG 24 hr capsule Take 120 mg by mouth daily.  6  . diltiazem (TIAZAC) 360 MG 24 hr capsule Take 1 capsule (360 mg total) by mouth daily. 90 capsule 1  . fluticasone (FLONASE) 50 MCG/ACT nasal spray Place 2 sprays into both nostrils daily. (Patient taking differently: Place 2 sprays into  both nostrils as needed. ) 16 g 6  . furosemide (LASIX) 40 MG tablet TAKE 1 TABLET BY MOUTH EVERY DAY 90 tablet 0  . gabapentin (NEURONTIN) 400 MG capsule Take 1 capsule (400 mg total) by mouth 3 (three) times daily. 180 capsule 3  . guaiFENesin (MUCINEX) 600 MG 12 hr tablet Take 1,200 mg by mouth as needed.     . insulin glargine (LANTUS) 100 UNIT/ML injection Inject 0.25 mLs (25 Units total) into the skin 2 (two) times daily. 10 mL 0  . LANTUS SOLOSTAR 100 UNIT/ML Solostar Pen INJECT 20 UNITS UNDER THE SKIN EVERY MORNING AND INJECT 20 UNITS UNDER THE SKIN EVERY EVENING 45 mL 0  . levalbuterol (XOPENEX) 0.63 MG/3ML nebulizer solution Inhale 3 mLs (0.63 mg total) into  the lungs 4 (four) times daily. 3 mL 0  . metFORMIN (GLUCOPHAGE) 500 MG tablet Take 3 tablets (1,500 mg total) by mouth daily with breakfast. 270 tablet 1  . metoprolol tartrate (LOPRESSOR) 50 MG tablet TAKE ONE TABLET BY MOUTH TWICE DAILY WITH A MEAL 180 tablet 0  . Multiple Vitamins-Minerals (COMPLETE MULTIVITAMIN/MINERAL PO) TAKE 1 TABLET BY MOUTH DAILY  99  . PROAIR HFA 108 (90 Base) MCG/ACT inhaler INHALE 2 PUFFS EVERY FOUR HOURS AS NEEDED FOR WHEEZING 18 g 1  . rivaroxaban (XARELTO) 20 MG TABS tablet Take 1 tablet (20 mg total) by mouth daily. 30 tablet 0  . ROXICODONE 5 MG immediate release tablet Take 1 tablet (5 mg total) by mouth every 4 (four) hours as needed for severe pain or breakthrough pain. 5 tablet 0  . tamsulosin (FLOMAX) 0.4 MG CAPS capsule Take 1 capsule (0.4 mg total) by mouth 2 (two) times daily after a meal. 180 capsule 1  . tiotropium (SPIRIVA HANDIHALER) 18 MCG inhalation capsule Place 1 capsule (18 mcg total) into inhaler and inhale daily. 30 capsule 12  . traZODone (DESYREL) 50 MG tablet Take 50 mg by mouth at bedtime.  5   No current facility-administered medications for this visit.    Allergies:  Patient has no known allergies.   Social History: The patient  reports that he has quit smoking. His smoking use included cigarettes. He has a 23.50 pack-year smoking history. He quit smokeless tobacco use about 3 years ago. He reports that he has current or past drug history. He reports that he does not drink alcohol.   ROS:  Please see the history of present illness. Otherwise, complete review of systems is positive for current rehabilitation for leg weakness.  All other systems are reviewed and negative.   Physical Exam: VS:  BP (!) 92/50   Pulse 68   Ht 6\' 1"  (1.854 m)   Wt 253 lb (114.8 kg)   SpO2 92%   BMI 33.38 kg/m , BMI Body mass index is 33.38 kg/m.  Wt Readings from Last 3 Encounters:  05/24/17 253 lb (114.8 kg)  04/29/17 256 lb 9.6 oz (116.4 kg)    04/24/17 255 lb (115.7 kg)    General: Chronically ill-appearing male, in wheelchair. HEENT: Conjunctiva and lids normal, oropharynx clear. Neck: Supple, no elevated JVP or carotid bruits, no thyromegaly. Lungs: Diminished breath sounds, nonlabored breathing at rest. Cardiac: Irregularly irregular, no S3 or significant systolic murmur, no pericardial rub. Abdomen: Soft, nontender, bowel sounds present. Extremities: Stable leg edema edema, distal pulses 1-2+. Skin: Warm and dry. Musculoskeletal: No kyphosis. Neuropsychiatric: Alert and oriented x3, affect grossly appropriate.  ECG: I personally reviewed the tracing from 04/30/2017 which showed  atrial flutter with variable conduction and poor R wave progression, rule out old anterior infarct pattern.  Recent Labwork: 12/02/2016: Magnesium 1.8 03/20/2017: ALT 32; AST 29 04/29/2017: BUN 18; Creatinine, Ser 1.12; Hemoglobin 11.7; Platelets 205; Potassium 3.7; Sodium 137     Component Value Date/Time   CHOL 135 12/26/2016 1107   TRIG 125 12/26/2016 1107   HDL 42 12/26/2016 1107   CHOLHDL 3.2 12/26/2016 1107   LDLCALC 72 12/26/2016 1107    Other Studies Reviewed Today:  Echocardiogram 11/15/2016: Study Conclusions  - Left ventricle: The cavity size was normal. Wall thickness was   normal. Systolic function was normal. The estimated ejection   fraction was in the range of 60% to 65%. Left ventricular   diastolic function parameters were normal. - Aortic valve: Valve area (VTI): 3.65 cm^2. Valve area (Vmax):   3.42 cm^2. Valve area (Vmean): 3.4 cm^2.  Assessment and Plan:  1.  Persistent atypical atrial flutter.  As noted above plan is for heart rate control strategy and anticoagulation.  He is on high-dose Cardizem CD along with Lanoxin and Lopressor.  Continues on Xarelto for stroke prophylaxis.  Follow-up CBC and BMP T for next visit.  2.  History of nonischemic cardiomyopathy with normalization of LVEF by echocardiogram within  the last year.  3.  Severe oxygen dependent COPD with chronic hypoxic respiratory failure.  4.  Small cell lung cancer.  Current medicines were reviewed with the patient today.   Orders Placed This Encounter  Procedures  . CBC  . Basic metabolic panel    Disposition: Follow-up in 6 months.  Signed, Satira Sark, MD, [facility] 05/24/2017 11:33 AM    [facility] [place], [place], [place] [address] Phone: [phone]; Fax: [phone]

## 2017-05-22 NOTE — Telephone Encounter (Signed)
Pt stopped by to question why was his  xarelto was changed from 20 mg, as this is what the Heart Dr in [facility] put him on. Please call the patient [phone]

## 2017-05-24 ENCOUNTER — Encounter: Payer: Self-pay | Admitting: Cardiology

## 2017-05-24 ENCOUNTER — Ambulatory Visit (INDEPENDENT_AMBULATORY_CARE_PROVIDER_SITE_OTHER): Payer: Medicare Other | Admitting: Cardiology

## 2017-05-24 VITALS — BP 92/50 | HR 68 | Ht 73.0 in | Wt 253.0 lb

## 2017-05-24 DIAGNOSIS — J449 Chronic obstructive pulmonary disease, unspecified: Secondary | ICD-10-CM

## 2017-05-24 DIAGNOSIS — C3490 Malignant neoplasm of unspecified part of unspecified bronchus or lung: Secondary | ICD-10-CM | POA: Diagnosis not present

## 2017-05-24 DIAGNOSIS — I4891 Unspecified atrial fibrillation: Secondary | ICD-10-CM | POA: Diagnosis not present

## 2017-05-24 DIAGNOSIS — Z85118 Personal history of other malignant neoplasm of bronchus and lung: Secondary | ICD-10-CM | POA: Diagnosis not present

## 2017-05-24 DIAGNOSIS — Z923 Personal history of irradiation: Secondary | ICD-10-CM | POA: Diagnosis not present

## 2017-05-24 DIAGNOSIS — I484 Atypical atrial flutter: Secondary | ICD-10-CM

## 2017-05-24 DIAGNOSIS — I428 Other cardiomyopathies: Secondary | ICD-10-CM

## 2017-05-24 DIAGNOSIS — R262 Difficulty in walking, not elsewhere classified: Secondary | ICD-10-CM | POA: Diagnosis not present

## 2017-05-24 DIAGNOSIS — M2681 Anterior soft tissue impingement: Secondary | ICD-10-CM | POA: Diagnosis not present

## 2017-05-24 DIAGNOSIS — Z794 Long term (current) use of insulin: Secondary | ICD-10-CM | POA: Diagnosis not present

## 2017-05-24 DIAGNOSIS — R29898 Other symptoms and signs involving the musculoskeletal system: Secondary | ICD-10-CM | POA: Diagnosis not present

## 2017-05-24 DIAGNOSIS — I509 Heart failure, unspecified: Secondary | ICD-10-CM | POA: Diagnosis not present

## 2017-05-24 DIAGNOSIS — R530 Neoplastic (malignant) related fatigue: Secondary | ICD-10-CM | POA: Diagnosis not present

## 2017-05-24 DIAGNOSIS — E119 Type 2 diabetes mellitus without complications: Secondary | ICD-10-CM | POA: Diagnosis not present

## 2017-05-24 NOTE — Patient Instructions (Signed)
Medication Instructions:  Your physician recommends that you continue on your current medications as directed. Please refer to the Current Medication list given to you today.  Labwork: NONE  Testing/Procedures:  CBC/BMET  In 6 months   Orders given today   Follow-Up: Your physician wants you to follow-up in: [place] DR. [name] You will receive a reminder letter in the mail two months in advance. If you don't receive a letter, please call our office to schedule the follow-up appointment.  Any Other Special Instructions Will Be Listed Below (If Applicable).  If you need a refill on your cardiac medications before your next appointment, please call your pharmacy.

## 2017-05-27 NOTE — Patient Instructions (Signed)
Chase Herrera  05/27/2017     @PREFPERIOPPHARMACY @   Your procedure is scheduled on  06/06/2017 .  Report to [facility] at  1115   A.M.  Call this number if you have problems the morning of surgery:  [phone]   Remember:  Do not eat food or drink liquids after midnight.  Take these medicines the morning of surgery with A SIP OF WATER  Zyrttec, digoxin, diltiazem, neurontin, metoprolol, flomax. Use your inhaler and your nebulizer before you come. Bring your rescue inhaler with you.   Do not wear jewelry, make-up or nail polish.  Do not wear lotions, powders, or perfumes, or deodorant.  Do not shave 48 hours prior to surgery.  Men may shave face and neck.  Do not bring valuables to the hospital.  [facility] is not responsible for any belongings or valuables.  Contacts, dentures or bridgework may not be worn into surgery.  Leave your suitcase in the car.  After surgery it may be brought to your room.  For patients admitted to the hospital, discharge time will be determined by your treatment team.  Patients discharged the day of surgery will not be allowed to drive home.   Name and phone number of your driver:   family Special instructions:  Follow the diet and prep instructions given to you by Dr Roseanne Kaufman office.  Please read over the following fact sheets that you were given. Anesthesia Post-op Instructions and Care and Recovery After Surgery       Colonoscopy, Adult A colonoscopy is an exam to look at the large intestine. It is done to check for problems, such as:  Lumps (tumors).  Growths (polyps).  Swelling (inflammation).  Bleeding.  What happens before the procedure? Eating and drinking Follow instructions from your doctor about eating and drinking. These instructions may include:  A few days before the procedure - follow a low-fiber diet. ? Avoid nuts. ? Avoid seeds. ? Avoid dried fruit. ? Avoid raw fruits. ? Avoid vegetables.  1-3  days before the procedure - follow a clear liquid diet. Avoid liquids that have red or purple dye. Drink only clear liquids, such as: ? Clear broth or bouillon. ? Black coffee or tea. ? Clear juice. ? Clear soft drinks or sports drinks. ? Gelatin dessert. ? Popsicles.  On the day of the procedure - do not eat or drink anything during the 2 hours before the procedure.  Bowel prep If you were prescribed an oral bowel prep:  Take it as told by your doctor. Starting the day before your procedure, you will need to drink a lot of liquid. The liquid will cause you to poop (have bowel movements) until your poop is almost clear or light green.  If your skin or butt gets irritated from diarrhea, you may: ? Wipe the area with wipes that have medicine in them, such as adult wet wipes with aloe and vitamin E. ? Put something on your skin that soothes the area, such as petroleum jelly.  If you throw up (vomit) while drinking the bowel prep, take a break for up to 60 minutes. Then begin the bowel prep again. If you keep throwing up and you cannot take the bowel prep without throwing up, call your doctor.  General instructions  Ask your doctor about changing or stopping your normal medicines. This is important if you take diabetes medicines or blood thinners.  Plan to have  someone take you home from the hospital or clinic. What happens during the procedure?  An IV tube may be put into one of your veins.  You will be given medicine to help you relax (sedative).  To reduce your risk of infection: ? Your doctors will wash their hands. ? Your anal area will be washed with soap.  You will be asked to lie on your side with your knees bent.  Your doctor will get a long, thin, flexible tube ready. The tube will have a camera and a light on the end.  The tube will be put into your anus.  The tube will be gently put into your large intestine.  Air will be delivered into your large intestine to keep  it open. You may feel some pressure or cramping.  The camera will be used to take photos.  A small tissue sample may be removed from your body to be looked at under a microscope (biopsy). If any possible problems are found, the tissue will be sent to a lab for testing.  If small growths are found, your doctor may remove them and have them checked for cancer.  The tube that was put into your anus will be slowly removed. The procedure may vary among doctors and hospitals. What happens after the procedure?  Your doctor will check on you often until the medicines you were given have worn off.  Do not drive for 24 hours after the procedure.  You may have a small amount of blood in your poop.  You may pass gas.  You may have mild cramps or bloating in your belly (abdomen).  It is up to you to get the results of your procedure. Ask your doctor, or the department performing the procedure, when your results will be ready. This information is not intended to replace advice given to you by your health care provider. Make sure you discuss any questions you have with your health care provider. Document Released: 02/10/2010 Document Revised: 11/09/2015 Document Reviewed: 03/22/2015 Elsevier Interactive Patient Education  2017 [employer]  Colonoscopy, Adult, Care After This sheet gives you information about how to care for yourself after your procedure. Your health care provider may also give you more specific instructions. If you have problems or questions, contact your health care provider. What can I expect after the procedure? After the procedure, it is common to have:  A small amount of blood in your stool for 24 hours after the procedure.  Some gas.  Mild abdominal cramping or bloating.  Follow these instructions at home: General instructions   For the first 24 hours after the procedure: ? Do not drive or use machinery. ? Do not sign important documents. ? Do not drink  alcohol. ? Do your regular daily activities at a slower pace than normal. ? Eat soft, easy-to-digest foods. ? Rest often.  Take over-the-counter or prescription medicines only as told by your health care provider.  It is up to you to get the results of your procedure. Ask your health care provider, or the department performing the procedure, when your results will be ready. Relieving cramping and bloating  Try walking around when you have cramps or feel bloated.  Apply heat to your abdomen as told by your health care provider. Use a heat source that your health care provider recommends, such as a moist heat pack or a heating pad. ? Place a towel between your skin and the heat source. ? Leave the heat on  for 20-30 minutes. ? Remove the heat if your skin turns bright red. This is especially important if you are unable to feel pain, heat, or cold. You may have a greater risk of getting burned. Eating and drinking  Drink enough fluid to keep your urine clear or pale yellow.  Resume your normal diet as instructed by your health care provider. Avoid heavy or fried foods that are hard to digest.  Avoid drinking alcohol for as long as instructed by your health care provider. Contact a health care provider if:  You have blood in your stool 2-3 days after the procedure. Get help right away if:  You have more than a small spotting of blood in your stool.  You pass large blood clots in your stool.  Your abdomen is swollen.  You have nausea or vomiting.  You have a fever.  You have increasing abdominal pain that is not relieved with medicine. This information is not intended to replace advice given to you by your health care provider. Make sure you discuss any questions you have with your health care provider. Document Released: 08/23/2003 Document Revised: 10/03/2015 Document Reviewed: 03/22/2015 Elsevier Interactive Patient Education  2018 [place] Anesthesia is a term that refers to techniques, procedures, and medicines that help a person stay safe and comfortable during a medical procedure. Monitored anesthesia care, or sedation, is one type of anesthesia. Your anesthesia specialist may recommend sedation if you will be having a procedure that does not require you to be unconscious, such as:  Cataract surgery.  A dental procedure.  A biopsy.  A colonoscopy.  During the procedure, you may receive a medicine to help you relax (sedative). There are three levels of sedation:  Mild sedation. At this level, you may feel awake and relaxed. You will be able to follow directions.  Moderate sedation. At this level, you will be sleepy. You may not remember the procedure.  Deep sedation. At this level, you will be asleep. You will not remember the procedure.  The more medicine you are given, the deeper your level of sedation will be. Depending on how you respond to the procedure, the anesthesia specialist may change your level of sedation or the type of anesthesia to fit your needs. An anesthesia specialist will monitor you closely during the procedure. Let your health care provider know about:  Any allergies you have.  All medicines you are taking, including vitamins, herbs, eye drops, creams, and over-the-counter medicines.  Any use of steroids (by mouth or as a cream).  Any problems you or family members have had with sedatives and anesthetic medicines.  Any blood disorders you have.  Any surgeries you have had.  Any medical conditions you have, such as sleep apnea.  Whether you are pregnant or may be pregnant.  Any use of cigarettes, alcohol, or street drugs. What are the risks? Generally, this is a safe procedure. However, problems may occur, including:  Getting too much medicine (oversedation).  Nausea.  Allergic reaction to medicines.  Trouble breathing. If this happens, a breathing tube may be used to help  with breathing. It will be removed when you are awake and breathing on your own.  Heart trouble.  Lung trouble.  Before the procedure Staying hydrated Follow instructions from your health care provider about hydration, which may include:  Up to 2 hours before the procedure - you may continue to drink clear liquids, such as water, clear fruit juice, black coffee,  and plain tea.  Eating and drinking restrictions Follow instructions from your health care provider about eating and drinking, which may include:  8 hours before the procedure - stop eating heavy meals or foods such as meat, fried foods, or fatty foods.  6 hours before the procedure - stop eating light meals or foods, such as toast or cereal.  6 hours before the procedure - stop drinking milk or drinks that contain milk.  2 hours before the procedure - stop drinking clear liquids.  Medicines Ask your health care provider about:  Changing or stopping your regular medicines. This is especially important if you are taking diabetes medicines or blood thinners.  Taking medicines such as aspirin and ibuprofen. These medicines can thin your blood. Do not take these medicines before your procedure if your health care provider instructs you not to.  Tests and exams  You will have a physical exam.  You may have blood tests done to show: ? How well your kidneys and liver are working. ? How well your blood can clot.  General instructions  Plan to have someone take you home from the hospital or clinic.  If you will be going home right after the procedure, plan to have someone with you for 24 hours.  What happens during the procedure?  Your blood pressure, heart rate, breathing, level of pain and overall condition will be monitored.  An IV tube will be inserted into one of your veins.  Your anesthesia specialist will give you medicines as needed to keep you comfortable during the procedure. This may mean changing the level  of sedation.  The procedure will be performed. After the procedure  Your blood pressure, heart rate, breathing rate, and blood oxygen level will be monitored until the medicines you were given have worn off.  Do not drive for 24 hours if you received a sedative.  You may: ? Feel sleepy, clumsy, or nauseous. ? Feel forgetful about what happened after the procedure. ? Have a sore throat if you had a breathing tube during the procedure. ? Vomit. This information is not intended to replace advice given to you by your health care provider. Make sure you discuss any questions you have with your health care provider. Document Released: 10/04/2004 Document Revised: 06/17/2015 Document Reviewed: 05/01/2015 Elsevier Interactive Patient Education  2018 [place], Care After These instructions provide you with information about caring for yourself after your procedure. Your health care provider may also give you more specific instructions. Your treatment has been planned according to current medical practices, but problems sometimes occur. Call your health care provider if you have any problems or questions after your procedure. What can I expect after the procedure? After your procedure, it is common to:  Feel sleepy for several hours.  Feel clumsy and have poor balance for several hours.  Feel forgetful about what happened after the procedure.  Have poor judgment for several hours.  Feel nauseous or vomit.  Have a sore throat if you had a breathing tube during the procedure.  Follow these instructions at home: For at least 24 hours after the procedure:   Do not: ? Participate in activities in which you could fall or become injured. ? Drive. ? Use heavy machinery. ? Drink alcohol. ? Take sleeping pills or medicines that cause drowsiness. ? Make important decisions or sign legal documents. ? Take care of children on your own.  Rest. Eating and  drinking  Follow the diet  that is recommended by your health care provider.  If you vomit, drink water, juice, or soup when you can drink without vomiting.  Make sure you have little or no nausea before eating solid foods. General instructions  Have a responsible adult stay with you until you are awake and alert.  Take over-the-counter and prescription medicines only as told by your health care provider.  If you smoke, do not smoke without supervision.  Keep all follow-up visits as told by your health care provider. This is important. Contact a health care provider if:  You keep feeling nauseous or you keep vomiting.  You feel light-headed.  You develop a rash.  You have a fever. Get help right away if:  You have trouble breathing. This information is not intended to replace advice given to you by your health care provider. Make sure you discuss any questions you have with your health care provider. Document Released: 05/01/2015 Document Revised: 08/31/2015 Document Reviewed: 05/01/2015 Elsevier Interactive Patient Education  [employer].

## 2017-05-28 DIAGNOSIS — Z923 Personal history of irradiation: Secondary | ICD-10-CM | POA: Diagnosis not present

## 2017-05-28 DIAGNOSIS — I4891 Unspecified atrial fibrillation: Secondary | ICD-10-CM | POA: Diagnosis not present

## 2017-05-28 DIAGNOSIS — R530 Neoplastic (malignant) related fatigue: Secondary | ICD-10-CM | POA: Diagnosis not present

## 2017-05-28 DIAGNOSIS — E119 Type 2 diabetes mellitus without complications: Secondary | ICD-10-CM | POA: Diagnosis not present

## 2017-05-28 DIAGNOSIS — I509 Heart failure, unspecified: Secondary | ICD-10-CM | POA: Diagnosis not present

## 2017-05-28 DIAGNOSIS — R29898 Other symptoms and signs involving the musculoskeletal system: Secondary | ICD-10-CM | POA: Diagnosis not present

## 2017-05-28 DIAGNOSIS — J449 Chronic obstructive pulmonary disease, unspecified: Secondary | ICD-10-CM | POA: Diagnosis not present

## 2017-05-28 DIAGNOSIS — C3490 Malignant neoplasm of unspecified part of unspecified bronchus or lung: Secondary | ICD-10-CM | POA: Diagnosis not present

## 2017-05-28 DIAGNOSIS — Z794 Long term (current) use of insulin: Secondary | ICD-10-CM | POA: Diagnosis not present

## 2017-05-28 DIAGNOSIS — R262 Difficulty in walking, not elsewhere classified: Secondary | ICD-10-CM | POA: Diagnosis not present

## 2017-05-28 DIAGNOSIS — M2681 Anterior soft tissue impingement: Secondary | ICD-10-CM | POA: Diagnosis not present

## 2017-05-30 ENCOUNTER — Other Ambulatory Visit: Payer: Self-pay

## 2017-05-30 ENCOUNTER — Encounter (HOSPITAL_COMMUNITY)
Admission: RE | Admit: 2017-05-30 | Discharge: 2017-05-30 | Disposition: A | Discharge status: Home / Self Care | Payer: Medicare Other | Source: Ambulatory Visit | Attending: Internal Medicine | Admitting: Internal Medicine

## 2017-05-30 ENCOUNTER — Encounter (HOSPITAL_COMMUNITY): Payer: Self-pay

## 2017-05-30 DIAGNOSIS — Z01818 Encounter for other preprocedural examination: Secondary | ICD-10-CM | POA: Insufficient documentation

## 2017-05-30 DIAGNOSIS — Z85118 Personal history of other malignant neoplasm of bronchus and lung: Secondary | ICD-10-CM | POA: Diagnosis not present

## 2017-05-30 DIAGNOSIS — Z86010 Personal history of colonic polyps: Secondary | ICD-10-CM | POA: Insufficient documentation

## 2017-05-30 DIAGNOSIS — H01006 Unspecified blepharitis left eye, unspecified eyelid: Secondary | ICD-10-CM | POA: Diagnosis not present

## 2017-05-30 DIAGNOSIS — H01002 Unspecified blepharitis right lower eyelid: Secondary | ICD-10-CM | POA: Diagnosis not present

## 2017-05-30 DIAGNOSIS — Z79899 Other long term (current) drug therapy: Secondary | ICD-10-CM | POA: Diagnosis not present

## 2017-05-30 DIAGNOSIS — I509 Heart failure, unspecified: Secondary | ICD-10-CM | POA: Diagnosis not present

## 2017-05-30 DIAGNOSIS — I110 Hypertensive heart disease with heart failure: Secondary | ICD-10-CM | POA: Diagnosis not present

## 2017-05-30 DIAGNOSIS — J449 Chronic obstructive pulmonary disease, unspecified: Secondary | ICD-10-CM | POA: Diagnosis not present

## 2017-05-30 DIAGNOSIS — E119 Type 2 diabetes mellitus without complications: Secondary | ICD-10-CM | POA: Diagnosis not present

## 2017-05-30 DIAGNOSIS — Z794 Long term (current) use of insulin: Secondary | ICD-10-CM | POA: Diagnosis not present

## 2017-05-30 HISTORY — DX: Personal history of urinary calculi: Z87.442

## 2017-05-30 LAB — CBC WITH DIFFERENTIAL/PLATELET
Basophils Absolute: 0 10*3/uL (ref 0.0–0.1)
Basophils Relative: 0 %
Eosinophils Absolute: 0.2 10*3/uL (ref 0.0–0.7)
Eosinophils Relative: 2 %
HCT: 37.3 % — ABNORMAL LOW (ref 39.0–52.0)
Hemoglobin: 11.8 g/dL — ABNORMAL LOW (ref 13.0–17.0)
Lymphocytes Relative: 10 %
Lymphs Abs: 0.7 10*3/uL (ref 0.7–4.0)
MCH: 32.4 pg (ref 26.0–34.0)
MCHC: 31.6 g/dL (ref 30.0–36.0)
MCV: 102.5 fL — ABNORMAL HIGH (ref 78.0–100.0)
Monocytes Absolute: 0.8 10*3/uL (ref 0.1–1.0)
Monocytes Relative: 12 %
Neutro Abs: 5.5 10*3/uL (ref 1.7–7.7)
Neutrophils Relative %: 76 %
Platelets: 199 10*3/uL (ref 150–400)
RBC: 3.64 MIL/uL — ABNORMAL LOW (ref 4.22–5.81)
RDW: 14.5 % (ref 11.5–15.5)
WBC: 7.2 10*3/uL (ref 4.0–10.5)

## 2017-05-30 LAB — BASIC METABOLIC PANEL
Anion gap: 12 (ref 5–15)
BUN: 17 mg/dL (ref 6–20)
CO2: 26 mmol/L (ref 22–32)
Calcium: 9.1 mg/dL (ref 8.9–10.3)
Chloride: 101 mmol/L (ref 101–111)
Creatinine, Ser: 1.19 mg/dL (ref 0.61–1.24)
GFR calc Af Amer: >60 mL/min (ref >60)
GFR calc non Af Amer: >60 mL/min (ref >60)
Glucose, Bld: 158 mg/dL — ABNORMAL HIGH (ref 65–99)
Potassium: 3.8 mmol/L (ref 3.5–5.1)
Sodium: 139 mmol/L (ref 135–145)

## 2017-05-31 DIAGNOSIS — Z794 Long term (current) use of insulin: Secondary | ICD-10-CM | POA: Diagnosis not present

## 2017-05-31 DIAGNOSIS — I4891 Unspecified atrial fibrillation: Secondary | ICD-10-CM | POA: Diagnosis not present

## 2017-05-31 DIAGNOSIS — M2681 Anterior soft tissue impingement: Secondary | ICD-10-CM | POA: Diagnosis not present

## 2017-05-31 DIAGNOSIS — R262 Difficulty in walking, not elsewhere classified: Secondary | ICD-10-CM | POA: Diagnosis not present

## 2017-05-31 DIAGNOSIS — C3490 Malignant neoplasm of unspecified part of unspecified bronchus or lung: Secondary | ICD-10-CM | POA: Diagnosis not present

## 2017-05-31 DIAGNOSIS — J449 Chronic obstructive pulmonary disease, unspecified: Secondary | ICD-10-CM | POA: Diagnosis not present

## 2017-05-31 DIAGNOSIS — Z923 Personal history of irradiation: Secondary | ICD-10-CM | POA: Diagnosis not present

## 2017-05-31 DIAGNOSIS — I509 Heart failure, unspecified: Secondary | ICD-10-CM | POA: Diagnosis not present

## 2017-05-31 DIAGNOSIS — R29898 Other symptoms and signs involving the musculoskeletal system: Secondary | ICD-10-CM | POA: Diagnosis not present

## 2017-05-31 DIAGNOSIS — R530 Neoplastic (malignant) related fatigue: Secondary | ICD-10-CM | POA: Diagnosis not present

## 2017-05-31 DIAGNOSIS — E119 Type 2 diabetes mellitus without complications: Secondary | ICD-10-CM | POA: Diagnosis not present

## 2017-06-03 ENCOUNTER — Encounter: Payer: Self-pay | Admitting: Family Medicine

## 2017-06-03 ENCOUNTER — Ambulatory Visit (INDEPENDENT_AMBULATORY_CARE_PROVIDER_SITE_OTHER): Payer: Medicare Other | Admitting: Family Medicine

## 2017-06-03 ENCOUNTER — Other Ambulatory Visit: Payer: Self-pay

## 2017-06-03 VITALS — BP 118/68 | HR 84 | Temp 97.4°F | Resp 18 | Ht 73.0 in | Wt 257.0 lb

## 2017-06-03 DIAGNOSIS — Z7901 Long term (current) use of anticoagulants: Secondary | ICD-10-CM | POA: Diagnosis not present

## 2017-06-03 DIAGNOSIS — J302 Other seasonal allergic rhinitis: Secondary | ICD-10-CM | POA: Diagnosis not present

## 2017-06-03 DIAGNOSIS — I110 Hypertensive heart disease with heart failure: Secondary | ICD-10-CM | POA: Diagnosis not present

## 2017-06-03 DIAGNOSIS — E785 Hyperlipidemia, unspecified: Secondary | ICD-10-CM | POA: Diagnosis not present

## 2017-06-03 DIAGNOSIS — G609 Hereditary and idiopathic neuropathy, unspecified: Secondary | ICD-10-CM | POA: Diagnosis not present

## 2017-06-03 DIAGNOSIS — Z23 Encounter for immunization: Secondary | ICD-10-CM | POA: Diagnosis not present

## 2017-06-03 DIAGNOSIS — Z85118 Personal history of other malignant neoplasm of bronchus and lung: Secondary | ICD-10-CM | POA: Diagnosis not present

## 2017-06-03 DIAGNOSIS — E1169 Type 2 diabetes mellitus with other specified complication: Secondary | ICD-10-CM | POA: Diagnosis not present

## 2017-06-03 DIAGNOSIS — E669 Obesity, unspecified: Secondary | ICD-10-CM | POA: Diagnosis not present

## 2017-06-03 DIAGNOSIS — I10 Essential (primary) hypertension: Secondary | ICD-10-CM

## 2017-06-03 DIAGNOSIS — E119 Type 2 diabetes mellitus without complications: Secondary | ICD-10-CM | POA: Diagnosis not present

## 2017-06-03 DIAGNOSIS — Z79899 Other long term (current) drug therapy: Secondary | ICD-10-CM | POA: Diagnosis not present

## 2017-06-03 DIAGNOSIS — I509 Heart failure, unspecified: Secondary | ICD-10-CM | POA: Diagnosis not present

## 2017-06-03 DIAGNOSIS — J441 Chronic obstructive pulmonary disease with (acute) exacerbation: Secondary | ICD-10-CM | POA: Diagnosis not present

## 2017-06-03 DIAGNOSIS — J432 Centrilobular emphysema: Secondary | ICD-10-CM

## 2017-06-03 DIAGNOSIS — R0602 Shortness of breath: Secondary | ICD-10-CM | POA: Diagnosis not present

## 2017-06-03 DIAGNOSIS — Z794 Long term (current) use of insulin: Secondary | ICD-10-CM | POA: Diagnosis not present

## 2017-06-03 DIAGNOSIS — I4891 Unspecified atrial fibrillation: Secondary | ICD-10-CM | POA: Diagnosis not present

## 2017-06-03 DIAGNOSIS — Z87891 Personal history of nicotine dependence: Secondary | ICD-10-CM | POA: Diagnosis not present

## 2017-06-03 MED ORDER — CETIRIZINE HCL 10 MG PO TABS: 10.0000 mg | ORAL_TABLET | Freq: Every day | ORAL | 11 refills | Status: DC

## 2017-06-03 NOTE — Progress Notes (Signed)
Patient ID: [name], male    DOB: Mar 07, 1950, 67 y.o.   MRN: [id number]  Chief Complaint  Patient presents with  . Follow-up    Allergies Patient has no known allergies.  Subjective:   Chase Herrera is a 67 y.o. male who presents to [facility] today.  HPI Chase Herrera presents here today for follow-up.  He reports that he has been doing well and actually feels he has been doing the best he has in several years.  He reports he has been completing the physical therapy at [facility] and he is getting his strength back.  He reports he has gained strength in his lower extremities.  He is hoping to start physical therapy also on his upper extremities.  He reports that his radiation oncologist is going to order this for him.  He reports that he has gained strength and he is now he not using a wheelchair.  He comes in today with the use of a cane.  He reports that his overall health is improving.  He reports that his breathing is better.  He denies any chest pain.  He is using his oxygen at night.  He has not been coughing.  He is not having any swelling in his extremities.  He reports his sugars are running well.  He continues to check his heart rate and his pulse ox at home multiple times throughout the day, because he likes to do this.  He reports that his heart rate is much better.  He reports that his heart rate always used to be so high but it is now been running in the 70s and he feels much better.  He denies any lesions on his feet.  He has been taking all of his medications.  He reports his heart is beating fine at this time.  He is still following up with all of his specialist regarding his breathing and history of cancer.  He has an upcoming colonoscopy scheduled.  He reports that his mood is good.  His energy is pretty good.  He has not had any recent falls.  His appetite is good.  He reports that he has enough food and is getting out of the house fairly frequently.   Denies any side effects or problems with his medication.  He denies any hypoglycemia.  He reports that he does feel like his allergies are bothering him.  He has not been taking his Zyrtec.  He reports he has had some postnasal drip with some occasional runny nose and congestion.  He is not having sinus pain or pressure.  He is not having a headache.  He denies any increased cough or sputum production.  He reports he is still taking his Neurontin for his neuropathy.  He feels like the medication is helping.  Denies any myalgias and reports he is taking his cholesterol medication.  He also reports he has been using his Flonase.  He reports he does get allergies during this time of the year.  He has not had his pneumonia 23 vaccination.  He is interested in receiving this today.   Past Medical History:  Diagnosis Date  . Alcohol abuse    Quit 08/23/11  . Arthritis   . Atrial fibrillation ([place])   . Atrial flutter ([place])    CTI ablation by Dr [name] 10/2011  . Bacteremia due to Gram-positive bacteria 11/29/2016  . COPD (chronic obstructive pulmonary disease) ([place])   . Depression   .  Dysrhythmia    AFib  . Essential hypertension   . History of cardiomyopathy    LVEF 25-30% 08/2011 with subsequent normalization  . History of kidney stones   . Obesity   . Prostate enlargement   . Small cell lung cancer ([place])    Right upper lobe - follows with [facility]  . Type 2 diabetes mellitus ([place])     Past Surgical History:  Procedure Laterality Date  . APPENDECTOMY    . ATRIAL ABLATION SURGERY  11/06/2011   CTI ablation for atrial flutter by Dr [name]  . ATRIAL FLUTTER ABLATION N/A 11/06/2011   Procedure: ATRIAL FLUTTER ABLATION;  Surgeon: Thompson Grayer, MD;  Location: [facility] CATH LAB;  Service: Cardiovascular;  Laterality: N/A;  . Cataracts    . IR GENERIC HISTORICAL  04/09/2016   IR US GUIDE VASC ACCESS RIGHT 04/09/2016 [name], DO [facility]-INTERV RAD  . IR GENERIC HISTORICAL  04/09/2016   IR FLUORO  GUIDE PORT INSERTION RIGHT 04/09/2016 [name], DO [facility]INTER[facility]  . PORT-A-CATH REMOVAL N/A 04/24/2017   Procedure: MINOR REMOVAL PORT-A-CATH;  Surgeon: [name], MD;  Location: [facility] ORS;  Service: General;  Laterality: N/A;  . TONSILLECTOMY      Family History  Problem Relation Age of Onset  . Diabetes Sister   . Hypertension Mother   . Lung cancer Mother   . Hypertension Father   . Heart attack Father   . Hypertension Sister   . Hypertension Sister   . Hypertension Sister   . Pulmonary fibrosis Sister   . Breast cancer Cousin   . Colon cancer Neg Hx      Social History   Socioeconomic History  . Marital status: Single    Spouse name: Not on file  . Number of children: Not on file  . Years of education: Not on file  . Highest education level: Not on file  Occupational History  . Occupation: retired     Comment: Network engineer  . Financial resource strain: Somewhat hard  . Food insecurity:    Worry: Patient refused    Inability: Patient refused  . Transportation needs:    Medical: Patient refused    Non-medical: Patient refused  Tobacco Use  . Smoking status: Former Smoker    Packs/day: 0.50    Years: 47.00    Pack years: 23.50    Types: Cigarettes    Last attempt to quit: 05/30/2012    Years since quitting: 5.0  . Smokeless tobacco: Former Systems developer    Quit date: 04/10/2014  . Tobacco comment: encouraged to quit today 03/04/12  Substance and Sexual Activity  . Alcohol use: No    Frequency: Never    Comment: quit 3 1/2 years ago  . Drug use: Not Currently    Comment: marajuana occasionally  . Sexual activity: Not Currently  Lifestyle  . Physical activity:    Days per week: Patient refused    Minutes per session: Patient refused  . Stress: To some extent  Relationships  . Social connections:    Talks on phone: Patient refused    Gets together: Patient refused    Attends religious service: Patient refused    Active member of club or  organization: Patient refused    Attends meetings of clubs or organizations: Patient refused    Relationship status: Patient refused  Other Topics Concern  . Not on file  Social History Narrative   Lives in [place] alone.  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 acetaminophen (TYLENOL) 325 MG tablet Take 650 mg by mouth every 6 (six) hours as needed for moderate pain or headache.     Marland Kitchen atorvastatin (LIPITOR) 20 MG tablet Take 1 tablet (20 mg total) by mouth daily. 90 tablet 3  . budesonide-formoterol (SYMBICORT) 160-4.5 MCG/ACT inhaler Inhale 2 puffs into the lungs 2 (two) times daily.     . Carboxymethylcellul-Glycerin (LUBRICATING EYE DROPS OP) Place 2 drops into both eyes daily as needed (dry eyes).    Marland Kitchen digoxin (LANOXIN) 0.125 MG tablet Take 1 tablet (125 mcg total) by mouth daily. 90 tablet 1  . diltiazem (CARDIZEM CD) 120 MG 24 hr capsule Take 120 mg by mouth daily.  6  . diltiazem (TIAZAC) 360 MG 24 hr capsule Take 1 capsule (360 mg total) by mouth daily. 90 capsule 1  . erythromycin ophthalmic ointment Place 1 application into the left eye 3 (three) times daily.   0  . fluticasone (FLONASE) 50 MCG/ACT nasal spray Place 2 sprays into both nostrils daily. (Patient taking differently: Place 2 sprays into both nostrils daily as needed for allergies. ) 16 g 6  . furosemide (LASIX) 40 MG tablet TAKE 1 TABLET BY MOUTH EVERY DAY 90 tablet 0  . gabapentin (NEURONTIN) 400 MG capsule Take 1 capsule (400 mg total) by mouth 3 (three) times daily. 180 capsule 3  . guaiFENesin (MUCINEX) 600 MG 12 hr tablet Take 1,200 mg by mouth daily as needed for cough.     . insulin glargine (LANTUS) 100 UNIT/ML injection Inject 0.25 mLs (25 Units total) into the skin 2 (two) times daily. 10 mL 0  . ipratropium-albuterol (DUONEB) 0.5-2.5 (3) MG/3ML SOLN Take 3 mLs by nebulization 4 (four)  times daily. Only take when out of Xopenex    . ketorolac (ACULAR) 0.5 % ophthalmic solution Place 1 drop into the left eye 4 (four) times daily as needed for pain.  0  . LANTUS SOLOSTAR 100 UNIT/ML Solostar Pen INJECT 20 UNITS UNDER THE SKIN EVERY MORNING AND INJECT 20 UNITS UNDER THE SKIN EVERY EVENING (Patient taking differently: Inject 25 units twice daily) 45 mL 0  . levalbuterol (XOPENEX) 0.63 MG/3ML nebulizer solution Inhale 3 mLs (0.63 mg total) into the lungs 4 (four) times daily. 3 mL 0  . metFORMIN (GLUCOPHAGE) 500 MG tablet Take 3 tablets (1,500 mg total) by mouth daily with breakfast. 270 tablet 1  . metoprolol tartrate (LOPRESSOR) 50 MG tablet TAKE ONE TABLET BY MOUTH TWICE DAILY WITH A MEAL 180 tablet 0  . Multiple Vitamins-Minerals (COMPLETE MULTIVITAMIN/MINERAL PO) TAKE 1 TABLET BY MOUTH DAILY  99  . polyethylene glycol (MIRALAX / GLYCOLAX) packet Take 17 g by mouth daily as needed for severe constipation.    Marland Kitchen PROAIR HFA 108 (90 Base) MCG/ACT inhaler INHALE 2 PUFFS EVERY FOUR HOURS AS NEEDED FOR WHEEZING 18 g 1  . rivaroxaban (XARELTO) 20 MG TABS tablet Take 1 tablet (20 mg total) by mouth daily. 30 tablet 0  . ROXICODONE 5 MG immediate release tablet Take 1 tablet (5 mg total) by mouth every 4 (four) hours as needed for severe pain or breakthrough pain. 5 tablet 0  . tamsulosin (FLOMAX) 0.4 MG CAPS capsule Take 1 capsule (0.4 mg total) by mouth 2 (two) times daily after a meal. 180 capsule 1  . tiotropium (  SPIRIVA HANDIHALER) 18 MCG inhalation capsule Place 1 capsule (18 mcg total) into inhaler and inhale daily. 30 capsule 12  . traZODone (DESYREL) 50 MG tablet Take 50 mg by mouth at bedtime.  5   No current facility-administered medications on file prior to visit.     Review of Systems  Constitutional: Negative for appetite change, chills, fever and unexpected weight change.  HENT: Positive for congestion and postnasal drip. Negative for facial swelling, mouth sores,  nosebleeds, sinus pressure, sinus pain, sore throat, trouble swallowing and voice change.        Post nasal drip. Sinus drainage present. Drains out of nose, can occasionally cough some up, colored or clear.   Eyes: Negative for visual disturbance.  Respiratory: Negative for cough, chest tightness, shortness of breath and wheezing.        Breathing is better or at least as good as usual per patient. No increase in SOB. No increase in sputum or change in color.   Cardiovascular: Negative for chest pain, palpitations and leg swelling.       Reports that his HR is running well. Checks pulse ox and HR multiple times a day and reports that HR is better. NO CP or edema.   Gastrointestinal: Negative for abdominal pain, diarrhea, nausea and vomiting.  Genitourinary: Negative for decreased urine volume, dysuria and frequency.  Skin: Negative for rash.  Neurological: Negative for dizziness, tremors, syncope, facial asymmetry, weakness and headaches.  Hematological: Negative for adenopathy. Does not bruise/bleed easily.  Psychiatric/Behavioral: Negative for dysphoric mood, self-injury and sleep disturbance. The patient is not nervous/anxious.      Objective:   BP 118/68 (BP Location: Left Arm, Patient Position: Sitting, Cuff Size: Large)   Pulse 84   Temp (!) 97.4 F (36.3 C) (Oral)   Resp 18   Ht 6\' 1"  (1.854 m)   Wt 257 lb (116.6 kg)   SpO2 90%   BMI 33.91 kg/m   Physical Exam  Constitutional: He is oriented to person, place, and time. He appears well-developed and well-nourished.  HENT:  Head: Normocephalic and atraumatic.  Eyes: Pupils are equal, round, and reactive to light. EOM are normal.  Neck: Normal range of motion. Neck supple. No thyromegaly present.  Cardiovascular: Normal rate, regular rhythm and normal heart sounds.  Pulmonary/Chest: Effort normal and breath sounds normal.  Musculoskeletal: He exhibits no edema.  Neurological: He is alert and oriented to person, place, and  time. No cranial nerve deficit.  Skin: Skin is warm, dry and intact.  Psychiatric: He has a normal mood and affect. His behavior is normal. Thought content normal.  Vitals reviewed.    Assessment and Plan  1. Diabetes mellitus type 2 in obese (HCC) Stable. Check labs.  Diet discussed. Foot exam UTD. Vision exam UTD.  - Hemoglobin A1c - Microalbumin / creatinine urine ratio - Basic metabolic panel  2. Essential hypertension Stable.  Lifestyle modifications discussed with patient including a diet emphasizing vegetables, fruits, and whole grains. Limiting intake of sodium to less than 2,400 mg per day.  Recommendations discussed include consuming low-fat dairy products, poultry, fish, legumes, non-tropical vegetable oils, and nuts; and limiting intake of sweets, sugar-sweetened beverages, and red meat. Discussed following a plan such as the Dietary Approaches to Stop Hypertension (DASH) diet. Patient to read up on this diet.    3. Idiopathic peripheral neuropathy Continue neurontin as directed.   4. Centrilobular emphysema (HCC) Continue COPD medications and home oxygen at night.  Vaccine indicated and given.  -  Pneumococcal polysaccharide vaccine 23-valent greater than or equal to 2yo subcutaneous/IM  5. Seasonal allergies Continue flonase as directed.  Allergy avoidance and precautions given. - cetirizine (ZYRTEC) 10 MG tablet; Take 1 tablet (10 mg total) by mouth daily.  Dispense: 30 tablet; Refill: 11  6. Hyperlipidemia associated with type 2 diabetes mellitus (HCC) LDL goal less than 70. Was previously just above goal. Hyperlipidemia and the associated risk of ASCVD were discussed today.  Secondary prevention of ASCVD were discussed and how it relates to patient morbidity, mortality, and quality of life. Shared decision making with patient including the risks of statins vs.benefits of ASCVD risk reduction discussed.  Risks of stains discussed including myopathy, rhabdomyoloysis,  liver problems, increased risk of diabetes discussed. We discussed heart healthy diet, lifestyle modifications, risk factor modifications, and adherence to the recommended treatment plan. We discussed the need to periodically monitor lipid panel and liver function tests while on statin therapy.   - Lipid panel  Return in about 3 months (around 09/03/2017) for follow up. [name], MD 06/04/2017

## 2017-06-04 ENCOUNTER — Telehealth: Payer: Self-pay | Admitting: Family Medicine

## 2017-06-04 ENCOUNTER — Encounter: Payer: Self-pay | Admitting: Family Medicine

## 2017-06-04 ENCOUNTER — Other Ambulatory Visit: Payer: Self-pay | Admitting: Family Medicine

## 2017-06-04 DIAGNOSIS — R29898 Other symptoms and signs involving the musculoskeletal system: Secondary | ICD-10-CM | POA: Diagnosis not present

## 2017-06-04 DIAGNOSIS — R262 Difficulty in walking, not elsewhere classified: Secondary | ICD-10-CM | POA: Diagnosis not present

## 2017-06-04 DIAGNOSIS — Z794 Long term (current) use of insulin: Secondary | ICD-10-CM | POA: Diagnosis not present

## 2017-06-04 DIAGNOSIS — J449 Chronic obstructive pulmonary disease, unspecified: Secondary | ICD-10-CM | POA: Diagnosis not present

## 2017-06-04 DIAGNOSIS — E119 Type 2 diabetes mellitus without complications: Secondary | ICD-10-CM | POA: Diagnosis not present

## 2017-06-04 DIAGNOSIS — R530 Neoplastic (malignant) related fatigue: Secondary | ICD-10-CM | POA: Diagnosis not present

## 2017-06-04 DIAGNOSIS — M2681 Anterior soft tissue impingement: Secondary | ICD-10-CM | POA: Diagnosis not present

## 2017-06-04 DIAGNOSIS — I4891 Unspecified atrial fibrillation: Secondary | ICD-10-CM | POA: Diagnosis not present

## 2017-06-04 DIAGNOSIS — Z923 Personal history of irradiation: Secondary | ICD-10-CM | POA: Diagnosis not present

## 2017-06-04 DIAGNOSIS — C3490 Malignant neoplasm of unspecified part of unspecified bronchus or lung: Secondary | ICD-10-CM | POA: Diagnosis not present

## 2017-06-04 DIAGNOSIS — I509 Heart failure, unspecified: Secondary | ICD-10-CM | POA: Diagnosis not present

## 2017-06-04 LAB — LIPID PANEL
Cholesterol: 100 mg/dL (ref <200)
HDL: 40 mg/dL — ABNORMAL LOW (ref >40)
LDL Cholesterol (Calc): 41 mg/dL (calc)
Non-HDL Cholesterol (Calc): 60 mg/dL (calc) (ref <130)
Total CHOL/HDL Ratio: 2.5 (calc) (ref <5.0)
Triglycerides: 103 mg/dL (ref <150)

## 2017-06-04 LAB — BASIC METABOLIC PANEL
BUN: 15 mg/dL (ref 7–25)
CO2: 27 mmol/L (ref 20–32)
Calcium: 9.2 mg/dL (ref 8.6–10.3)
Chloride: 102 mmol/L (ref 98–110)
Creat: 1.09 mg/dL (ref 0.70–1.25)
Glucose, Bld: 144 mg/dL — ABNORMAL HIGH (ref 65–99)
Potassium: 4.1 mmol/L (ref 3.5–5.3)
Sodium: 140 mmol/L (ref 135–146)

## 2017-06-04 LAB — HEMOGLOBIN A1C
Hgb A1c MFr Bld: 7.2 % of total Hgb — ABNORMAL HIGH (ref <5.7)
Mean Plasma Glucose: 160 (calc)
eAG (mmol/L): 8.9 (calc)

## 2017-06-04 LAB — MICROALBUMIN / CREATININE URINE RATIO
Creatinine, Urine: 42 mg/dL (ref 20–320)
Microalb Creat Ratio: 7 mcg/mg creat (ref <30)
Microalb, Ur: 0.3 mg/dL

## 2017-06-04 MED ORDER — METFORMIN HCL 1000 MG PO TABS: 1000.0000 mg | ORAL_TABLET | Freq: Two times a day (BID) | ORAL | 3 refills | Status: DC

## 2017-06-04 NOTE — Telephone Encounter (Signed)
Spoke with patient and advised of recent lab results and Dr.Hagler's recommendations with verbal understanding. Patient in agreement with treatment plan. Patient aware new prescription has been sent in to pharmacy and understands new instructions on how to take it.

## 2017-06-04 NOTE — Telephone Encounter (Signed)
Please call patient advised that his hemoglobin A1c is slightly increased at 7.2%.  This is an increase from his last hemoglobin A1c approximately 6 months ago.  At this time I would recommend that he increase his metformin to 1000 mg twice a day.  He is currently on 1500 mg total a day.  I would also like for him to continue working on his diet and cutting down on his sugar intake.  I have called this new dosing of medication into the pharmacy.  He should follow-up in our office within 3 months and we will need to check his hemoglobin A1c at this time.  Please advised that his cholesterol was much better, his electrolytes, kidney function, and urine testing was normal.  It does not appear that the diabetes is affecting his kidneys at this time.  I have sent him a letter in the mail.  I have also sent this medication into his pharmacy.  Please review new dosing with him at this time 1000 mg twice a day of metformin.

## 2017-06-06 ENCOUNTER — Encounter (HOSPITAL_COMMUNITY)
Admission: RE | Disposition: A | Discharge status: Home / Self Care | Payer: Self-pay | Source: Ambulatory Visit | Attending: Internal Medicine

## 2017-06-06 ENCOUNTER — Ambulatory Visit (HOSPITAL_COMMUNITY)
Admission: RE | Admit: 2017-06-06 | Discharge: 2017-06-06 | Disposition: A | Discharge status: Home / Self Care | Payer: Medicare Other | Source: Ambulatory Visit | Attending: Internal Medicine | Admitting: Internal Medicine

## 2017-06-06 ENCOUNTER — Ambulatory Visit (HOSPITAL_COMMUNITY): Payer: Medicare Other | Admitting: Anesthesiology

## 2017-06-06 ENCOUNTER — Encounter (HOSPITAL_COMMUNITY): Payer: Self-pay

## 2017-06-06 DIAGNOSIS — F329 Major depressive disorder, single episode, unspecified: Secondary | ICD-10-CM | POA: Insufficient documentation

## 2017-06-06 DIAGNOSIS — Z794 Long term (current) use of insulin: Secondary | ICD-10-CM | POA: Diagnosis not present

## 2017-06-06 DIAGNOSIS — Z7901 Long term (current) use of anticoagulants: Secondary | ICD-10-CM | POA: Insufficient documentation

## 2017-06-06 DIAGNOSIS — N400 Enlarged prostate without lower urinary tract symptoms: Secondary | ICD-10-CM | POA: Diagnosis not present

## 2017-06-06 DIAGNOSIS — Z79899 Other long term (current) drug therapy: Secondary | ICD-10-CM | POA: Insufficient documentation

## 2017-06-06 DIAGNOSIS — Z86010 Personal history of colonic polyps: Secondary | ICD-10-CM

## 2017-06-06 DIAGNOSIS — I119 Hypertensive heart disease without heart failure: Secondary | ICD-10-CM | POA: Insufficient documentation

## 2017-06-06 DIAGNOSIS — Z87891 Personal history of nicotine dependence: Secondary | ICD-10-CM | POA: Insufficient documentation

## 2017-06-06 DIAGNOSIS — E119 Type 2 diabetes mellitus without complications: Secondary | ICD-10-CM | POA: Diagnosis not present

## 2017-06-06 DIAGNOSIS — K5730 Diverticulosis of large intestine without perforation or abscess without bleeding: Secondary | ICD-10-CM | POA: Diagnosis not present

## 2017-06-06 DIAGNOSIS — Z1211 Encounter for screening for malignant neoplasm of colon: Secondary | ICD-10-CM | POA: Diagnosis not present

## 2017-06-06 DIAGNOSIS — J449 Chronic obstructive pulmonary disease, unspecified: Secondary | ICD-10-CM | POA: Insufficient documentation

## 2017-06-06 DIAGNOSIS — I4891 Unspecified atrial fibrillation: Secondary | ICD-10-CM | POA: Insufficient documentation

## 2017-06-06 DIAGNOSIS — Z8249 Family history of ischemic heart disease and other diseases of the circulatory system: Secondary | ICD-10-CM | POA: Insufficient documentation

## 2017-06-06 DIAGNOSIS — Z85118 Personal history of other malignant neoplasm of bronchus and lung: Secondary | ICD-10-CM | POA: Diagnosis not present

## 2017-06-06 DIAGNOSIS — K640 First degree hemorrhoids: Secondary | ICD-10-CM | POA: Diagnosis not present

## 2017-06-06 DIAGNOSIS — I429 Cardiomyopathy, unspecified: Secondary | ICD-10-CM | POA: Insufficient documentation

## 2017-06-06 DIAGNOSIS — M1990 Unspecified osteoarthritis, unspecified site: Secondary | ICD-10-CM | POA: Diagnosis not present

## 2017-06-06 DIAGNOSIS — Z7951 Long term (current) use of inhaled steroids: Secondary | ICD-10-CM | POA: Insufficient documentation

## 2017-06-06 HISTORY — PX: COLONOSCOPY WITH PROPOFOL: SHX5780

## 2017-06-06 LAB — GLUCOSE, CAPILLARY
Glucose-Capillary: 113 mg/dL — ABNORMAL HIGH (ref 65–99)
Glucose-Capillary: 106 mg/dL — ABNORMAL HIGH (ref 65–99)

## 2017-06-06 SURGERY — COLONOSCOPY WITH PROPOFOL
Anesthesia: Monitor Anesthesia Care

## 2017-06-06 MED ORDER — LACTATED RINGERS IV SOLN
INTRAVENOUS | Status: DC
  Administered 2017-06-06: 14:00:00 via INTRAVENOUS

## 2017-06-06 MED ORDER — MIDAZOLAM HCL 2 MG/2ML IJ SOLN
INTRAMUSCULAR | Status: AC
  Filled 2017-06-06: qty 2

## 2017-06-06 MED ORDER — LIDOCAINE HCL (PF) 1 % IJ SOLN
INTRAMUSCULAR | Status: DC | PRN
  Administered 2017-06-06: 50 mg

## 2017-06-06 MED ORDER — PROPOFOL 500 MG/50ML IV EMUL: INTRAVENOUS | Status: DC | PRN

## 2017-06-06 MED ORDER — CHLORHEXIDINE GLUCONATE CLOTH 2 % EX PADS: 6.0000 | MEDICATED_PAD | Freq: Once | CUTANEOUS | Status: DC

## 2017-06-06 MED ORDER — PROPOFOL 10 MG/ML IV BOLUS
INTRAVENOUS | Status: DC | PRN
  Administered 2017-06-06: 100 mg via INTRAVENOUS
  Administered 2017-06-06 (×2): 50 mg via INTRAVENOUS

## 2017-06-06 NOTE — Discharge Instructions (Signed)
Colonoscopy Discharge Instructions  Read the instructions outlined below and refer to this sheet in the next few weeks. These discharge instructions provide you with general information on caring for yourself after you leave the hospital. Your doctor may also give you specific instructions. While your treatment has been planned according to the most current medical practices available, unavoidable complications occasionally occur. If you have any problems or questions after discharge, call Dr. Gala Romney at [phone]. ACTIVITY  You may resume your regular activity, but move at a slower pace for the next 24 hours.   Take frequent rest periods for the next 24 hours.   Walking will help get rid of the air and reduce the bloated feeling in your belly (abdomen).   No driving for 24 hours (because of the medicine (anesthesia) used during the test).    Do not sign any important legal documents or operate any machinery for 24 hours (because of the anesthesia used during the test).  NUTRITION  Drink plenty of fluids.   You may resume your normal diet as instructed by your doctor.   Begin with a light meal and progress to your normal diet. Heavy or fried foods are harder to digest and may make you feel sick to your stomach (nauseated).   Avoid alcoholic beverages for 24 hours or as instructed.  MEDICATIONS  You may resume your normal medications unless your doctor tells you otherwise.  WHAT YOU CAN EXPECT TODAY  Some feelings of bloating in the abdomen.   Passage of more gas than usual.   Spotting of blood in your stool or on the toilet paper.  IF YOU HAD POLYPS REMOVED DURING THE COLONOSCOPY:  No aspirin products for 7 days or as instructed.   No alcohol for 7 days or as instructed.   Eat a soft diet for the next 24 hours.  FINDING OUT THE RESULTS OF YOUR TEST Not all test results are available during your visit. If your test results are not back during the visit, make an appointment  with your caregiver to find out the results. Do not assume everything is normal if you have not heard from your caregiver or the medical facility. It is important for you to follow up on all of your test results.  SEEK IMMEDIATE MEDICAL ATTENTION IF:  You have more than a spotting of blood in your stool.   Your belly is swollen (abdominal distention).   You are nauseated or vomiting.   You have a temperature over 101.   You have abdominal pain or discomfort that is severe or gets worse throughout the day.    Diverticulosis and hemorrhoid information provided  Repeat colonoscopy for surveillance purposes in 5 years  Resume Xarelto today.    PATIENT INSTRUCTIONS POST-ANESTHESIA  IMMEDIATELY FOLLOWING SURGERY:  Do not drive or operate machinery for the first twenty four hours after surgery.  Do not make any important decisions for twenty four hours after surgery or while taking narcotic pain medications or sedatives.  If you develop intractable nausea and vomiting or a severe headache please notify your doctor immediately.  FOLLOW-UP:  Please make an appointment with your surgeon as instructed. You do not need to follow up with anesthesia unless specifically instructed to do so.  WOUND CARE INSTRUCTIONS (if applicable):  Keep a dry clean dressing on the anesthesia/puncture wound site if there is drainage.  Once the wound has quit draining you may leave it open to air.  Generally you should leave the bandage  intact for twenty four hours unless there is drainage.  If the epidural site drains for more than 36-48 hours please call the anesthesia department.  QUESTIONS?:  Please feel free to call your physician or the hospital operator if you have any questions, and they will be happy to assist you.        Diverticulosis Diverticulosis is a condition that develops when small pouches (diverticula) form in the wall of the large intestine (colon). The colon is where water is absorbed and  stool is formed. The pouches form when the inside layer of the colon pushes through weak spots in the outer layers of the colon. You may have a few pouches or many of them. What are the causes? The cause of this condition is not known. What increases the risk? The following factors may make you more likely to develop this condition:  Being older than age 7. Your risk for this condition increases with age. Diverticulosis is rare among people younger than age 69. By age 28, many people have it.  Eating a low-fiber diet.  Having frequent constipation.  Being overweight.  Not getting enough exercise.  Smoking.  Taking over-the-counter pain medicines, like aspirin and ibuprofen.  Having a family history of diverticulosis.  What are the signs or symptoms? In most people, there are no symptoms of this condition. If you do have symptoms, they may include:  Bloating.  Cramps in the abdomen.  Constipation or diarrhea.  Pain in the lower left side of the abdomen.  How is this diagnosed? This condition is most often diagnosed during an exam for other colon problems. Because diverticulosis usually has no symptoms, it often cannot be diagnosed independently. This condition may be diagnosed by:  Using a flexible scope to examine the colon (colonoscopy).  Taking an X-ray of the colon after dye has been put into the colon (barium enema).  Doing a CT scan.  How is this treated? You may not need treatment for this condition if you have never developed an infection related to diverticulosis. If you have had an infection before, treatment may include:  Eating a high-fiber diet. This may include eating more fruits, vegetables, and grains.  Taking a fiber supplement.  Taking a live bacteria supplement (probiotic).  Taking medicine to relax your colon.  Taking antibiotic medicines.  Follow these instructions at home:  Drink 6-8 glasses of water or more each day to prevent  constipation.  Try not to strain when you have a bowel movement.  If you have had an infection before: ? Eat more fiber as directed by your health care provider or your diet and nutrition specialist (dietitian). ? Take a fiber supplement or probiotic, if your health care provider approves.  Take over-the-counter and prescription medicines only as told by your health care provider.  If you were prescribed an antibiotic, take it as told by your health care provider. Do not stop taking the antibiotic even if you start to feel better.  Keep all follow-up visits as told by your health care provider. This is important. Contact a health care provider if:  You have pain in your abdomen.  You have bloating.  You have cramps.  You have not had a bowel movement in 3 days. Get help right away if:  Your pain gets worse.  Your bloating becomes very bad.  You have a fever or chills, and your symptoms suddenly get worse.  You vomit.  You have bowel movements that are bloody or  black.  You have bleeding from your rectum. Summary  Diverticulosis is a condition that develops when small pouches (diverticula) form in the wall of the large intestine (colon).  You may have a few pouches or many of them.  This condition is most often diagnosed during an exam for other colon problems.  If you have had an infection related to diverticulosis, treatment may include increasing the fiber in your diet, taking supplements, or taking medicines. This information is not intended to replace advice given to you by your health care provider. Make sure you discuss any questions you have with your health care provider. Document Released: 10/06/2003 Document Revised: 11/28/2015 Document Reviewed: 11/28/2015 Elsevier Interactive Patient Education  2017 [place].    Hemorrhoids Hemorrhoids are swollen veins in and around the rectum or anus. There are two types of hemorrhoids:  Internal hemorrhoids.  These occur in the veins that are just inside the rectum. They may poke through to the outside and become irritated and painful.  External hemorrhoids. These occur in the veins that are outside of the anus and can be felt as a painful swelling or hard lump near the anus.  Most hemorrhoids do not cause serious problems, and they can be managed with home treatments such as diet and lifestyle changes. If home treatments do not help your symptoms, procedures can be done to shrink or remove the hemorrhoids. What are the causes? This condition is caused by increased pressure in the anal area. This pressure may result from various things, including:  Constipation.  Straining to have a bowel movement.  Diarrhea.  Pregnancy.  Obesity.  Sitting for long periods of time.  Heavy lifting or other activity that causes you to strain.  Anal sex.  What are the signs or symptoms? Symptoms of this condition include:  Pain.  Anal itching or irritation.  Rectal bleeding.  Leakage of stool (feces).  Anal swelling.  One or more lumps around the anus.  How is this diagnosed? This condition can often be diagnosed through a visual exam. Other exams or tests may also be done, such as:  Examination of the rectal area with a gloved hand (digital rectal exam).  Examination of the anal canal using a small tube (anoscope).  A blood test, if you have lost a significant amount of blood.  A test to look inside the colon (sigmoidoscopy or colonoscopy).  How is this treated? This condition can usually be treated at home. However, various procedures may be done if dietary changes, lifestyle changes, and other home treatments do not help your symptoms. These procedures can help make the hemorrhoids smaller or remove them completely. Some of these procedures involve surgery, and others do not. Common procedures include:  Rubber band ligation. Rubber bands are placed at the base of the hemorrhoids to cut  off the blood supply to them.  Sclerotherapy. Medicine is injected into the hemorrhoids to shrink them.  Infrared coagulation. A type of light energy is used to get rid of the hemorrhoids.  Hemorrhoidectomy surgery. The hemorrhoids are surgically removed, and the veins that supply them are tied off.  Stapled hemorrhoidopexy surgery. A circular stapling device is used to remove the hemorrhoids and use staples to cut off the blood supply to them.  Follow these instructions at home: Eating and drinking  Eat foods that have a lot of fiber in them, such as whole grains, beans, nuts, fruits, and vegetables. Ask your health care provider about taking products that have added  fiber (fiber supplements).  Drink enough fluid to keep your urine clear or pale yellow. Managing pain and swelling  Take warm sitz baths for 20 minutes, 3-4 times a day to ease pain and discomfort.  If directed, apply ice to the affected area. Using ice packs between sitz baths may be helpful. ? Put ice in a plastic bag. ? Place a towel between your skin and the bag. ? Leave the ice on for 20 minutes, 2-3 times a day. General instructions  Take over-the-counter and prescription medicines only as told by your health care provider.  Use medicated creams or suppositories as told.  Exercise regularly.  Go to the bathroom when you have the urge to have a bowel movement. Do not wait.  Avoid straining to have bowel movements.  Keep the anal area dry and clean. Use wet toilet paper or moist towelettes after a bowel movement.  Do not sit on the toilet for long periods of time. This increases blood pooling and pain. Contact a health care provider if:  You have increasing pain and swelling that are not controlled by treatment or medicine.  You have uncontrolled bleeding.  You have difficulty having a bowel movement, or you are unable to have a bowel movement.  You have pain or inflammation outside the area of the  hemorrhoids. This information is not intended to replace advice given to you by your health care provider. Make sure you discuss any questions you have with your health care provider. Document Released: 01/06/2000 Document Revised: 06/08/2015 Document Reviewed: 09/22/2014 Elsevier Interactive Patient Education  [employer].

## 2017-06-06 NOTE — H&P (Signed)
@LOGO @   Primary Care Physician:  [name], MD Primary Gastroenterologist:  Dr. Gala Romney  Pre-Procedure History & Physical: HPI:  Chase Herrera is a 67 y.o. male here for surveillance colonoscopy. History of polyps removed at the [facility]. Overdue for surveillance examination. Xarelto stopped 2 days ago per plan.  Past Medical History:  Diagnosis Date  . Alcohol abuse    Quit 08/23/11  . Arthritis   . Atrial fibrillation ([place])   . Atrial flutter ([place])    CTI ablation by Dr [name] 10/2011  . Bacteremia due to Gram-positive bacteria 11/29/2016  . COPD (chronic obstructive pulmonary disease) ([place])   . Depression   . Dysrhythmia    AFib  . Essential hypertension   . History of cardiomyopathy    LVEF 25-30% 08/2011 with subsequent normalization  . History of kidney stones   . Obesity   . Prostate enlargement   . Small cell lung cancer ([place])    Right upper lobe - follows with [facility]  . Type 2 diabetes mellitus ([place])     Past Surgical History:  Procedure Laterality Date  . APPENDECTOMY    . ATRIAL ABLATION SURGERY  11/06/2011   CTI ablation for atrial flutter by Dr [name]  . ATRIAL FLUTTER ABLATION N/A 11/06/2011   Procedure: ATRIAL FLUTTER ABLATION;  Surgeon: Thompson Grayer, MD;  Location: [facility] CATH LAB;  Service: Cardiovascular;  Laterality: N/A;  . Cataracts    . IR GENERIC HISTORICAL  04/09/2016   IR US GUIDE VASC ACCESS RIGHT 04/09/2016 [name], DO [facility]-INTERV RAD  . IR GENERIC HISTORICAL  04/09/2016   IR FLUORO GUIDE PORT INSERTION RIGHT 04/09/2016 [name], DO [facility]INTER[facility]  . PORT-A-CATH REMOVAL N/A 04/24/2017   Procedure: MINOR REMOVAL PORT-A-CATH;  Surgeon: [name], MD;  Location: [facility] ORS;  Service: General;  Laterality: N/A;  . TONSILLECTOMY      Prior to Admission medications   Medication Sig Start Date End Date Taking? Authorizing Provider  acetaminophen (TYLENOL) 325 MG tablet Take 650 mg by mouth every 6 (six) hours as needed for  moderate pain or headache.    Yes [provider]  atorvastatin (LIPITOR) 20 MG tablet Take 1 tablet (20 mg total) by mouth daily. 01/03/17  Yes Hagler, Apolonio Schneiders, MD  budesonide-formoterol [facility]) 160-4.5 MCG/ACT inhaler Inhale 2 puffs into the lungs 2 (two) times daily.  08/28/16  Yes [provider]  Carboxymethylcellul-Glycerin (LUBRICATING EYE DROPS OP) Place 2 drops into both eyes daily as needed (dry eyes).   Yes [provider]  cetirizine (ZYRTEC) 10 MG tablet Take 1 tablet (10 mg total) by mouth daily. 06/03/17  Yes Hagler, Apolonio Schneiders, MD  digoxin (LANOXIN) 0.125 MG tablet Take 1 tablet (125 mcg total) by mouth daily. 01/03/17  Yes [name], MD  diltiazem (CARDIZEM CD) 120 MG 24 hr capsule Take 120 mg by mouth daily. 02/26/17  Yes [provider]  diltiazem (TIAZAC) 360 MG 24 hr capsule Take 1 capsule (360 mg total) by mouth daily. 01/03/17  Yes Hagler, Apolonio Schneiders, MD  fluticasone (FLONASE) 50 MCG/ACT nasal spray Place 2 sprays into both nostrils daily. Patient taking differently: Place 2 sprays into both nostrils daily as needed for allergies.  03/06/17  Yes Hagler, Apolonio Schneiders, MD  furosemide (LASIX) 40 MG tablet TAKE 1 TABLET BY MOUTH EVERY DAY 01/17/17  Yes Hagler, Apolonio Schneiders, MD  gabapentin (NEURONTIN) 400 MG capsule Take 1 capsule (400 mg total) by mouth 3 (three) times daily. 01/03/17  Yes  [name], MD  ipratropium-albuterol (DUONEB) 0.5-2.5 (3) MG/3ML SOLN Take 3 mLs by nebulization 4 (four) times daily. Only take when out of Xopenex   Yes [provider]  ketorolac (ACULAR) 0.5 % ophthalmic solution Place 1 drop into the left eye 4 (four) times daily as needed for pain. 05/30/17  Yes [provider]  LANTUS SOLOSTAR 100 UNIT/ML Solostar Pen INJECT 20 UNITS UNDER THE SKIN EVERY MORNING AND INJECT 20 UNITS UNDER THE SKIN EVERY EVENING Patient taking differently: Inject 25 units twice daily 05/02/17  Yes Hagler, Apolonio Schneiders, MD  levalbuterol  (XOPENEX) 0.63 MG/3ML nebulizer solution Inhale 3 mLs (0.63 mg total) into the lungs 4 (four) times daily. 01/03/17  Yes Hagler, Apolonio Schneiders, MD  metoprolol tartrate (LOPRESSOR) 50 MG tablet TAKE ONE TABLET BY MOUTH TWICE DAILY WITH A MEAL 05/15/17  Yes Hagler, Apolonio Schneiders, MD  Multiple Vitamins-Minerals (COMPLETE MULTIVITAMIN/MINERAL PO) TAKE 1 TABLET BY MOUTH DAILY 03/05/17  Yes [provider]  polyethylene glycol (MIRALAX / GLYCOLAX) packet Take 17 g by mouth daily as needed for severe constipation.   Yes [provider]  PROAIR HFA 108 (90 Base) MCG/ACT inhaler INHALE 2 PUFFS EVERY FOUR HOURS AS NEEDED FOR WHEEZING 04/16/17  Yes Hagler, Apolonio Schneiders, MD  rivaroxaban (XARELTO) 20 MG TABS tablet Take 1 tablet (20 mg total) by mouth daily. 05/22/17 06/21/17 Yes Hagler, Apolonio Schneiders, MD  tamsulosin (FLOMAX) 0.4 MG CAPS capsule Take 1 capsule (0.4 mg total) by mouth 2 (two) times daily after a meal. 01/03/17  Yes Hagler, Apolonio Schneiders, MD  tiotropium (SPIRIVA HANDIHALER) 18 MCG inhalation capsule Place 1 capsule (18 mcg total) into inhaler and inhale daily. 03/06/17  Yes [name], MD  traZODone (DESYREL) 50 MG tablet Take 50 mg by mouth at bedtime. 04/04/17  Yes [provider]  erythromycin ophthalmic ointment Place 1 application into the left eye 3 (three) times daily.  05/30/17   [provider]  guaiFENesin (MUCINEX) 600 MG 12 hr tablet Take 1,200 mg by mouth daily as needed for cough.     [provider]  insulin glargine (LANTUS) 100 UNIT/ML injection Inject 0.25 mLs (25 Units total) into the skin 2 (two) times daily. 05/02/17   [name], MD  metFORMIN (GLUCOPHAGE) 1000 MG tablet Take 1 tablet (1,000 mg total) by mouth 2 (two) times daily with a meal. 06/04/17   [name], MD  ROXICODONE 5 MG immediate release tablet Take 1 tablet (5 mg total) by mouth every 4 (four) hours as needed for severe pain or breakthrough pain. 04/24/17 04/24/18  [name], MD    Allergies as  of 04/29/2017  . (No Known Allergies)    Family History  Problem Relation Age of Onset  . Diabetes Sister   . Hypertension Mother   . Lung cancer Mother   . Hypertension Father   . Heart attack Father   . Hypertension Sister   . Hypertension Sister   . Hypertension Sister   . Pulmonary fibrosis Sister   . Breast cancer Cousin   . Colon cancer Neg Hx     Social History   Socioeconomic History  . Marital status: Single    Spouse name: Not on file  . Number of children: Not on file  . Years of education: Not on file  . Highest education level: Not on file  Occupational History  . Occupation: retired     Comment: Network engineer  . Financial resource strain: Somewhat hard  . Food insecurity:  Worry: Patient refused    Inability: Patient refused  . Transportation needs:    Medical: Patient refused    Non-medical: Patient refused  Tobacco Use  . Smoking status: Former Smoker    Packs/day: 0.50    Years: 47.00    Pack years: 23.50    Types: Cigarettes    Last attempt to quit: 05/30/2012    Years since quitting: 5.0  . Smokeless tobacco: Former Systems developer    Quit date: 04/10/2014  . Tobacco comment: encouraged to quit today 03/04/12  Substance and Sexual Activity  . Alcohol use: No    Frequency: Never    Comment: quit 3 1/2 years ago  . Drug use: Not Currently    Comment: marajuana occasionally  . Sexual activity: Not Currently  Lifestyle  . Physical activity:    Days per week: Patient refused    Minutes per session: Patient refused  . Stress: To some extent  Relationships  . Social connections:    Talks on phone: Patient refused    Gets together: Patient refused    Attends religious service: Patient refused    Active member of club or organization: Patient refused    Attends meetings of clubs or organizations: Patient refused    Relationship status: Patient refused  . Intimate partner violence:    Fear of current or ex partner: Patient refused     Emotionally abused: Patient refused    Physically abused: Patient refused    Forced sexual activity: Patient refused  Other Topics Concern  . Not on file  Social History Narrative   Lives in [place] alone.   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 No significant cervical adenopathy. Lungs:  Clear throughout to auscultation.   No wheezes, crackles, or rhonchi. No acute distress. Heart:  Regular rate and rhythm; no murmurs, clicks, rubs,  or gallops. Abdomen: Non-distended, normal bowel sounds.  Soft and nontender without appreciable mass or hepatosplenomegaly.  Pulses:  Normal pulses noted. Extremities:  Without clubbing or edema.  Impression/Plan:  History of colonic polyps. Here for surveillance colonoscopy. Zar[place]in has been held 2 days.  I have offered the patient a surveillance colonoscopy today per plan.  The risks, benefits, limitations, alternatives and imponderables have been reviewed with the patient. Questions have been answered. All parties are agreeable.      Notice: This dictation was prepared with Dragon dictation along with smaller phrase technology. Any transcriptional errors that result from this process are unintentional and may not be corrected upon review.

## 2017-06-06 NOTE — Anesthesia Postprocedure Evaluation (Signed)
Anesthesia Post Note  Patient: Chase Herrera  Procedure(s) Performed: COLONOSCOPY WITH PROPOFOL (N/A )  Patient location during evaluation: PACU Anesthesia Type: MAC Level of consciousness: awake and alert Pain management: pain level controlled Vital Signs Assessment: post-procedure vital signs reviewed and stable Respiratory status: spontaneous breathing, nonlabored ventilation, respiratory function stable and patient connected to nasal cannula oxygen Cardiovascular status: stable and blood pressure returned to baseline Postop Assessment: no apparent nausea or vomiting Anesthetic complications: no     Last Vitals:  Vitals:   06/06/17 1334  BP: (!) 138/59  Pulse: 60  Temp: 36.7 C  SpO2: 92%    Last Pain:  Vitals:   06/06/17 1502  TempSrc:   PainSc: 0-No pain                 Nicanor Alcon

## 2017-06-06 NOTE — Transfer of Care (Signed)
Immediate Anesthesia Transfer of Care Note  Patient: Chase Herrera  Procedure(s) Performed: COLONOSCOPY WITH PROPOFOL (N/A )  Patient Location: PACU  Anesthesia Type:MAC  Level of Consciousness: awake, alert  and oriented  Airway & Oxygen Therapy: Patient Spontanous Breathing and Patient connected to nasal cannula oxygen  Post-op Assessment: Report given to RN and Post -op Vital signs reviewed and stable  Post vital signs: stable  Last Vitals:  Vitals Value Taken Time  BP 140/66 06/06/2017  3:27 PM  Temp    Pulse 65 06/06/2017  3:29 PM  Resp    SpO2 100 % 06/06/2017  3:29 PM  Vitals shown include unvalidated device data.  Last Pain:  Vitals:   06/06/17 1502  TempSrc:   PainSc: 0-No pain         Complications: No apparent anesthesia complications

## 2017-06-06 NOTE — Op Note (Signed)
[facility] Patient Name: Chase Herrera Procedure Date: 06/06/2017 2:44 PM MRN: [id number] Date of Birth: Jul 19, 1950 Attending MD: Norvel Richards , MD CSN: [id number] Age: 67 Admit Type: Outpatient Procedure:                Colonoscopy Indications:              High risk colon cancer surveillance: Personal                            history of colonic polyps Providers:                Norvel Richards, MD, Otis Peak B. Sharon Seller, RN,                            Randa Spike, Technician Referring MD:             [name] Medicines:                Propofol per Anesthesia Complications:            No immediate complications. Estimated Blood Loss:     Estimated blood loss: none. Procedure:                Pre-Anesthesia Assessment:                           - Prior to the procedure, a History and Physical                            was performed, and patient medications and                            allergies were reviewed. The patient's tolerance of                            previous anesthesia was also reviewed. The risks                            and benefits of the procedure and the sedation                            options and risks were discussed with the patient.                            All questions were answered, and informed consent                            was obtained. Prior Anticoagulants: The patient                            last took Xarelto (rivaroxaban) 2 days prior to the                            procedure. ASA Grade Assessment: II - A patient  with mild systemic disease. After reviewing the                            risks and benefits, the patient was deemed in                            satisfactory condition to undergo the procedure.                           After obtaining informed consent, the colonoscope                            was passed under direct vision. Throughout the   procedure, the patient's blood pressure, pulse, and                            oxygen saturations were monitored continuously. The                            EC-[id number] ([id number]) scope was introduced through                            the and advanced to the the cecum, identified by                            appendiceal orifice and ileocecal valve. The                            colonoscopy was performed without difficulty. The                            patient tolerated the procedure well. The quality                            of the bowel preparation was adequate. The                            ileocecal valve, appendiceal orifice, and rectum                            were photographed. The quality of the bowel                            preparation was adequate. Scope In: 3:09:08 PM Scope Out: 3:22:46 PM Scope Withdrawal Time: 0 hours 8 minutes 48 seconds  Total Procedure Duration: 0 hours 13 minutes 38 seconds  Findings:      The perianal and digital rectal examinations were normal.      Multiple medium-mouthed diverticula were found in the sigmoid colon and       descending colon.      Non-bleeding internal hemorrhoids were found during retroflexion. The       hemorrhoids were moderate, medium-sized and Grade I (internal       hemorrhoids that do not prolapse).      The exam was otherwise without abnormality on direct and  retroflexion       views. Impression:               - Diverticulosis in the sigmoid colon and in the                            descending colon.                           - Non-bleeding internal hemorrhoids.                           - The examination was otherwise normal on direct                            and retroflexion views.                           - No specimens collected. Repeat colonoscopy in 5                            years. Moderate Sedation:      Moderate (conscious) sedation was personally administered by an       anesthesia professional.  The following parameters were monitored: oxygen       saturation, heart rate, blood pressure, respiratory rate, EKG, adequacy       of pulmonary ventilation, and response to care. Total physician       intraservice time was 21 minutes. Recommendation:           - Patient has a contact number available for                            emergencies. The signs and symptoms of potential                            delayed complications were discussed with the                            patient. Return to normal activities tomorrow.                            Written discharge instructions were provided to the                            patient. Procedure Code(s):        --- Professional ---                           [phone], Colonoscopy, flexible; diagnostic, including                            collection of specimen(s) by brushing or washing,                            when performed (separate procedure) Diagnosis Code(s):        --- Professional ---  Z86.010, Personal history of colonic polyps                           K64.0, First degree hemorrhoids                           K57.30, Diverticulosis of large intestine without                            perforation or abscess without bleeding CPT copyright 2017 American Medical Association. All rights reserved. The codes documented in this report are preliminary and upon coder review may  be revised to meet current compliance requirements. Cristopher Estimable. Rourk, MD Norvel Richards, MD 06/06/2017 3:31:36 PM This report has been signed electronically. Number of Addenda: 0

## 2017-06-06 NOTE — Anesthesia Preprocedure Evaluation (Signed)
Anesthesia Evaluation  Patient identified by MRN, date of birth, ID band Patient awake    Reviewed: Allergy & Precautions, H&P , NPO status , Patient's Chart, lab work & pertinent test results, reviewed documented beta blocker date and time   Airway Mallampati: II  TM Distance: >3 FB Neck ROM: full    Dental no notable dental hx.    Pulmonary neg pulmonary ROS, pneumonia, COPD, former smoker,    Pulmonary exam normal breath sounds clear to auscultation       Cardiovascular Exercise Tolerance: Good hypertension, + Peripheral Vascular Disease  negative cardio ROS  Atrial Fibrillation  Rhythm:regular Rate:Normal     Neuro/Psych PSYCHIATRIC DISORDERS Depression negative neurological ROS  negative psych ROS   GI/Hepatic negative GI ROS, Neg liver ROS,   Endo/Other  negative endocrine ROSdiabetes  Renal/GU negative Renal ROS  negative genitourinary   Musculoskeletal   Abdominal   Peds  Hematology negative hematology ROS (+)   Anesthesia Other Findings ATRIAL FLUTTER ABLATION Malignant neoplasm of lung   Reproductive/Obstetrics negative OB ROS                             Anesthesia Physical Anesthesia Plan  ASA: IV  Anesthesia Plan: MAC   Post-op Pain Management:    Induction:   PONV Risk Score and Plan:   Airway Management Planned:   Additional Equipment:   Intra-op Plan:   Post-operative Plan:   Informed Consent: I have reviewed the patients History and Physical, chart, labs and discussed the procedure including the risks, benefits and alternatives for the proposed anesthesia with the patient or authorized representative who has indicated his/her understanding and acceptance.   Dental Advisory Given  Plan Discussed with: CRNA  Anesthesia Plan Comments:         Anesthesia Quick Evaluation

## 2017-06-07 DIAGNOSIS — Z7901 Long term (current) use of anticoagulants: Secondary | ICD-10-CM | POA: Diagnosis not present

## 2017-06-07 DIAGNOSIS — R29898 Other symptoms and signs involving the musculoskeletal system: Secondary | ICD-10-CM | POA: Diagnosis not present

## 2017-06-07 DIAGNOSIS — C3490 Malignant neoplasm of unspecified part of unspecified bronchus or lung: Secondary | ICD-10-CM | POA: Diagnosis not present

## 2017-06-07 DIAGNOSIS — R0603 Acute respiratory distress: Secondary | ICD-10-CM | POA: Diagnosis not present

## 2017-06-07 DIAGNOSIS — Z836 Family history of other diseases of the respiratory system: Secondary | ICD-10-CM | POA: Diagnosis not present

## 2017-06-07 DIAGNOSIS — I110 Hypertensive heart disease with heart failure: Secondary | ICD-10-CM | POA: Diagnosis not present

## 2017-06-07 DIAGNOSIS — M2681 Anterior soft tissue impingement: Secondary | ICD-10-CM | POA: Diagnosis not present

## 2017-06-07 DIAGNOSIS — R0600 Dyspnea, unspecified: Secondary | ICD-10-CM | POA: Diagnosis not present

## 2017-06-07 DIAGNOSIS — J449 Chronic obstructive pulmonary disease, unspecified: Secondary | ICD-10-CM | POA: Diagnosis not present

## 2017-06-07 DIAGNOSIS — E119 Type 2 diabetes mellitus without complications: Secondary | ICD-10-CM | POA: Diagnosis not present

## 2017-06-07 DIAGNOSIS — Z87891 Personal history of nicotine dependence: Secondary | ICD-10-CM | POA: Diagnosis not present

## 2017-06-07 DIAGNOSIS — R262 Difficulty in walking, not elsewhere classified: Secondary | ICD-10-CM | POA: Diagnosis not present

## 2017-06-07 DIAGNOSIS — I509 Heart failure, unspecified: Secondary | ICD-10-CM | POA: Diagnosis not present

## 2017-06-07 DIAGNOSIS — R0602 Shortness of breath: Secondary | ICD-10-CM | POA: Diagnosis not present

## 2017-06-07 DIAGNOSIS — Z9981 Dependence on supplemental oxygen: Secondary | ICD-10-CM | POA: Diagnosis not present

## 2017-06-07 DIAGNOSIS — Z794 Long term (current) use of insulin: Secondary | ICD-10-CM | POA: Diagnosis not present

## 2017-06-07 DIAGNOSIS — R530 Neoplastic (malignant) related fatigue: Secondary | ICD-10-CM | POA: Diagnosis not present

## 2017-06-07 DIAGNOSIS — Z79899 Other long term (current) drug therapy: Secondary | ICD-10-CM | POA: Diagnosis not present

## 2017-06-07 DIAGNOSIS — Z85118 Personal history of other malignant neoplasm of bronchus and lung: Secondary | ICD-10-CM | POA: Diagnosis not present

## 2017-06-07 DIAGNOSIS — Z923 Personal history of irradiation: Secondary | ICD-10-CM | POA: Diagnosis not present

## 2017-06-07 DIAGNOSIS — I4891 Unspecified atrial fibrillation: Secondary | ICD-10-CM | POA: Diagnosis not present

## 2017-06-10 ENCOUNTER — Encounter (HOSPITAL_COMMUNITY): Payer: Self-pay | Admitting: Internal Medicine

## 2017-06-11 ENCOUNTER — Other Ambulatory Visit: Payer: Self-pay | Admitting: Family Medicine

## 2017-06-11 DIAGNOSIS — C3490 Malignant neoplasm of unspecified part of unspecified bronchus or lung: Secondary | ICD-10-CM | POA: Diagnosis not present

## 2017-06-11 DIAGNOSIS — J449 Chronic obstructive pulmonary disease, unspecified: Secondary | ICD-10-CM | POA: Diagnosis not present

## 2017-06-11 DIAGNOSIS — E119 Type 2 diabetes mellitus without complications: Secondary | ICD-10-CM | POA: Diagnosis not present

## 2017-06-11 DIAGNOSIS — M2681 Anterior soft tissue impingement: Secondary | ICD-10-CM | POA: Diagnosis not present

## 2017-06-11 DIAGNOSIS — Z794 Long term (current) use of insulin: Secondary | ICD-10-CM | POA: Diagnosis not present

## 2017-06-11 DIAGNOSIS — I4891 Unspecified atrial fibrillation: Secondary | ICD-10-CM | POA: Diagnosis not present

## 2017-06-11 DIAGNOSIS — R530 Neoplastic (malignant) related fatigue: Secondary | ICD-10-CM | POA: Diagnosis not present

## 2017-06-11 DIAGNOSIS — I509 Heart failure, unspecified: Secondary | ICD-10-CM | POA: Diagnosis not present

## 2017-06-11 DIAGNOSIS — R262 Difficulty in walking, not elsewhere classified: Secondary | ICD-10-CM | POA: Diagnosis not present

## 2017-06-11 DIAGNOSIS — R29898 Other symptoms and signs involving the musculoskeletal system: Secondary | ICD-10-CM | POA: Diagnosis not present

## 2017-06-11 DIAGNOSIS — Z923 Personal history of irradiation: Secondary | ICD-10-CM | POA: Diagnosis not present

## 2017-06-13 DIAGNOSIS — J449 Chronic obstructive pulmonary disease, unspecified: Secondary | ICD-10-CM | POA: Diagnosis not present

## 2017-06-14 ENCOUNTER — Telehealth: Payer: Self-pay | Admitting: Family Medicine

## 2017-06-14 DIAGNOSIS — I509 Heart failure, unspecified: Secondary | ICD-10-CM | POA: Diagnosis not present

## 2017-06-14 DIAGNOSIS — R531 Weakness: Secondary | ICD-10-CM

## 2017-06-14 DIAGNOSIS — M2681 Anterior soft tissue impingement: Secondary | ICD-10-CM | POA: Diagnosis not present

## 2017-06-14 DIAGNOSIS — J449 Chronic obstructive pulmonary disease, unspecified: Secondary | ICD-10-CM | POA: Diagnosis not present

## 2017-06-14 DIAGNOSIS — R530 Neoplastic (malignant) related fatigue: Secondary | ICD-10-CM | POA: Diagnosis not present

## 2017-06-14 DIAGNOSIS — R262 Difficulty in walking, not elsewhere classified: Secondary | ICD-10-CM | POA: Diagnosis not present

## 2017-06-14 DIAGNOSIS — Z923 Personal history of irradiation: Secondary | ICD-10-CM | POA: Diagnosis not present

## 2017-06-14 DIAGNOSIS — E119 Type 2 diabetes mellitus without complications: Secondary | ICD-10-CM | POA: Diagnosis not present

## 2017-06-14 DIAGNOSIS — C3490 Malignant neoplasm of unspecified part of unspecified bronchus or lung: Secondary | ICD-10-CM | POA: Diagnosis not present

## 2017-06-14 DIAGNOSIS — Z794 Long term (current) use of insulin: Secondary | ICD-10-CM | POA: Diagnosis not present

## 2017-06-14 DIAGNOSIS — I4891 Unspecified atrial fibrillation: Secondary | ICD-10-CM | POA: Diagnosis not present

## 2017-06-14 DIAGNOSIS — R29898 Other symptoms and signs involving the musculoskeletal system: Secondary | ICD-10-CM | POA: Diagnosis not present

## 2017-06-14 NOTE — Telephone Encounter (Signed)
Pt is requesting upper body PT at [facility].  He said you told him you would order it if the Cancer Doctor did not order it.

## 2017-06-14 NOTE — Telephone Encounter (Signed)
Please advise referral placed.

## 2017-06-18 DIAGNOSIS — I509 Heart failure, unspecified: Secondary | ICD-10-CM | POA: Diagnosis not present

## 2017-06-18 DIAGNOSIS — J449 Chronic obstructive pulmonary disease, unspecified: Secondary | ICD-10-CM | POA: Diagnosis not present

## 2017-06-18 DIAGNOSIS — E11620 Type 2 diabetes mellitus with diabetic dermatitis: Secondary | ICD-10-CM | POA: Diagnosis not present

## 2017-06-18 DIAGNOSIS — Z7901 Long term (current) use of anticoagulants: Secondary | ICD-10-CM | POA: Diagnosis not present

## 2017-06-18 DIAGNOSIS — T63481A Toxic effect of venom of other arthropod, accidental (unintentional), initial encounter: Secondary | ICD-10-CM | POA: Diagnosis not present

## 2017-06-18 DIAGNOSIS — Z79899 Other long term (current) drug therapy: Secondary | ICD-10-CM | POA: Diagnosis not present

## 2017-06-18 DIAGNOSIS — I110 Hypertensive heart disease with heart failure: Secondary | ICD-10-CM | POA: Diagnosis not present

## 2017-06-18 DIAGNOSIS — L089 Local infection of the skin and subcutaneous tissue, unspecified: Secondary | ICD-10-CM | POA: Diagnosis not present

## 2017-06-18 DIAGNOSIS — Z87891 Personal history of nicotine dependence: Secondary | ICD-10-CM | POA: Diagnosis not present

## 2017-06-18 DIAGNOSIS — S1096XA Insect bite of unspecified part of neck, initial encounter: Secondary | ICD-10-CM | POA: Diagnosis not present

## 2017-06-18 DIAGNOSIS — Z794 Long term (current) use of insulin: Secondary | ICD-10-CM | POA: Diagnosis not present

## 2017-06-18 DIAGNOSIS — Z85118 Personal history of other malignant neoplasm of bronchus and lung: Secondary | ICD-10-CM | POA: Diagnosis not present

## 2017-06-18 NOTE — Telephone Encounter (Signed)
Called pt to discuss referral. No answer, unable to leave vm. Will try again later.

## 2017-06-19 DIAGNOSIS — Z923 Personal history of irradiation: Secondary | ICD-10-CM | POA: Diagnosis not present

## 2017-06-19 DIAGNOSIS — Z794 Long term (current) use of insulin: Secondary | ICD-10-CM | POA: Diagnosis not present

## 2017-06-19 DIAGNOSIS — C3490 Malignant neoplasm of unspecified part of unspecified bronchus or lung: Secondary | ICD-10-CM | POA: Diagnosis not present

## 2017-06-19 DIAGNOSIS — M2681 Anterior soft tissue impingement: Secondary | ICD-10-CM | POA: Diagnosis not present

## 2017-06-19 DIAGNOSIS — R262 Difficulty in walking, not elsewhere classified: Secondary | ICD-10-CM | POA: Diagnosis not present

## 2017-06-19 DIAGNOSIS — I509 Heart failure, unspecified: Secondary | ICD-10-CM | POA: Diagnosis not present

## 2017-06-19 DIAGNOSIS — R29898 Other symptoms and signs involving the musculoskeletal system: Secondary | ICD-10-CM | POA: Diagnosis not present

## 2017-06-19 DIAGNOSIS — E119 Type 2 diabetes mellitus without complications: Secondary | ICD-10-CM | POA: Diagnosis not present

## 2017-06-19 DIAGNOSIS — R530 Neoplastic (malignant) related fatigue: Secondary | ICD-10-CM | POA: Diagnosis not present

## 2017-06-19 DIAGNOSIS — I4891 Unspecified atrial fibrillation: Secondary | ICD-10-CM | POA: Diagnosis not present

## 2017-06-19 DIAGNOSIS — J449 Chronic obstructive pulmonary disease, unspecified: Secondary | ICD-10-CM | POA: Diagnosis not present

## 2017-06-20 DIAGNOSIS — E1140 Type 2 diabetes mellitus with diabetic neuropathy, unspecified: Secondary | ICD-10-CM | POA: Diagnosis not present

## 2017-06-20 DIAGNOSIS — M79672 Pain in left foot: Secondary | ICD-10-CM | POA: Diagnosis not present

## 2017-06-20 DIAGNOSIS — L110 Acquired keratosis follicularis: Secondary | ICD-10-CM | POA: Diagnosis not present

## 2017-06-20 DIAGNOSIS — M79671 Pain in right foot: Secondary | ICD-10-CM | POA: Diagnosis not present

## 2017-06-21 DIAGNOSIS — M2681 Anterior soft tissue impingement: Secondary | ICD-10-CM | POA: Diagnosis not present

## 2017-06-21 DIAGNOSIS — R262 Difficulty in walking, not elsewhere classified: Secondary | ICD-10-CM | POA: Diagnosis not present

## 2017-06-21 DIAGNOSIS — I4891 Unspecified atrial fibrillation: Secondary | ICD-10-CM | POA: Diagnosis not present

## 2017-06-21 DIAGNOSIS — I509 Heart failure, unspecified: Secondary | ICD-10-CM | POA: Diagnosis not present

## 2017-06-21 DIAGNOSIS — R29898 Other symptoms and signs involving the musculoskeletal system: Secondary | ICD-10-CM | POA: Diagnosis not present

## 2017-06-21 DIAGNOSIS — J449 Chronic obstructive pulmonary disease, unspecified: Secondary | ICD-10-CM | POA: Diagnosis not present

## 2017-06-21 DIAGNOSIS — Z923 Personal history of irradiation: Secondary | ICD-10-CM | POA: Diagnosis not present

## 2017-06-21 DIAGNOSIS — E119 Type 2 diabetes mellitus without complications: Secondary | ICD-10-CM | POA: Diagnosis not present

## 2017-06-21 DIAGNOSIS — C3490 Malignant neoplasm of unspecified part of unspecified bronchus or lung: Secondary | ICD-10-CM | POA: Diagnosis not present

## 2017-06-21 DIAGNOSIS — Z794 Long term (current) use of insulin: Secondary | ICD-10-CM | POA: Diagnosis not present

## 2017-06-21 DIAGNOSIS — R530 Neoplastic (malignant) related fatigue: Secondary | ICD-10-CM | POA: Diagnosis not present

## 2017-06-21 NOTE — Telephone Encounter (Signed)
Spoke with patient and advised that referral had been placed  with verbal understanding.

## 2017-06-27 ENCOUNTER — Encounter: Payer: Self-pay | Admitting: Family Medicine

## 2017-06-28 ENCOUNTER — Encounter: Payer: Self-pay | Admitting: Family Medicine

## 2017-07-01 ENCOUNTER — Ambulatory Visit (HOSPITAL_COMMUNITY)
Admission: RE | Admit: 2017-07-01 | Discharge: 2017-07-01 | Disposition: A | Discharge status: Home / Self Care | Payer: Medicare Other | Source: Ambulatory Visit | Attending: Internal Medicine | Admitting: Internal Medicine

## 2017-07-01 DIAGNOSIS — J479 Bronchiectasis, uncomplicated: Secondary | ICD-10-CM | POA: Insufficient documentation

## 2017-07-01 DIAGNOSIS — R918 Other nonspecific abnormal finding of lung field: Secondary | ICD-10-CM | POA: Diagnosis not present

## 2017-07-01 DIAGNOSIS — C3490 Malignant neoplasm of unspecified part of unspecified bronchus or lung: Secondary | ICD-10-CM | POA: Diagnosis present

## 2017-07-01 DIAGNOSIS — K769 Liver disease, unspecified: Secondary | ICD-10-CM | POA: Diagnosis not present

## 2017-07-01 DIAGNOSIS — Y842 Radiological procedure and radiotherapy as the cause of abnormal reaction of the patient, or of later complication, without mention of misadventure at the time of the procedure: Secondary | ICD-10-CM | POA: Insufficient documentation

## 2017-07-01 MED ORDER — IOHEXOL 300 MG/ML  SOLN
75.0000 mL | Freq: Once | INTRAMUSCULAR | Status: AC | PRN
  Administered 2017-07-01: 75 mL via INTRAVENOUS

## 2017-07-03 ENCOUNTER — Telehealth: Payer: Self-pay | Admitting: Family Medicine

## 2017-07-03 NOTE — Telephone Encounter (Signed)
Patient left voicemail requesting a call back to review his recent catscan results.

## 2017-07-03 NOTE — Telephone Encounter (Signed)
Please call and see what patient needs. However, the scans that were ordered by oncology need to be reviewed and discussed with patient by oncology. Chase Herrera. Mannie Stabile, MD

## 2017-07-04 ENCOUNTER — Inpatient Hospital Stay (HOSPITAL_COMMUNITY): Payer: Medicare Other | Attending: Oncology | Admitting: Hematology

## 2017-07-04 DIAGNOSIS — Z87891 Personal history of nicotine dependence: Secondary | ICD-10-CM | POA: Insufficient documentation

## 2017-07-04 DIAGNOSIS — Z9221 Personal history of antineoplastic chemotherapy: Secondary | ICD-10-CM | POA: Insufficient documentation

## 2017-07-04 DIAGNOSIS — Z85118 Personal history of other malignant neoplasm of bronchus and lung: Secondary | ICD-10-CM | POA: Insufficient documentation

## 2017-07-04 DIAGNOSIS — K648 Other hemorrhoids: Secondary | ICD-10-CM | POA: Insufficient documentation

## 2017-07-04 DIAGNOSIS — K5730 Diverticulosis of large intestine without perforation or abscess without bleeding: Secondary | ICD-10-CM | POA: Insufficient documentation

## 2017-07-04 DIAGNOSIS — Z923 Personal history of irradiation: Secondary | ICD-10-CM | POA: Insufficient documentation

## 2017-07-04 NOTE — Telephone Encounter (Signed)
Called patient. VM hasn't been set up. Unable to leave vm. Will try again later.

## 2017-07-10 ENCOUNTER — Other Ambulatory Visit: Payer: Self-pay

## 2017-07-10 ENCOUNTER — Inpatient Hospital Stay (HOSPITAL_BASED_OUTPATIENT_CLINIC_OR_DEPARTMENT_OTHER): Payer: Medicare Other | Admitting: Hematology

## 2017-07-10 ENCOUNTER — Encounter (HOSPITAL_COMMUNITY): Payer: Self-pay | Admitting: Hematology

## 2017-07-10 VITALS — BP 133/70 | HR 67 | Temp 97.7°F | Resp 18 | Wt 258.0 lb

## 2017-07-10 DIAGNOSIS — Z87891 Personal history of nicotine dependence: Secondary | ICD-10-CM

## 2017-07-10 DIAGNOSIS — K648 Other hemorrhoids: Secondary | ICD-10-CM | POA: Diagnosis not present

## 2017-07-10 DIAGNOSIS — Z85118 Personal history of other malignant neoplasm of bronchus and lung: Secondary | ICD-10-CM | POA: Diagnosis not present

## 2017-07-10 DIAGNOSIS — K5730 Diverticulosis of large intestine without perforation or abscess without bleeding: Secondary | ICD-10-CM | POA: Diagnosis not present

## 2017-07-10 DIAGNOSIS — C3411 Malignant neoplasm of upper lobe, right bronchus or lung: Secondary | ICD-10-CM | POA: Diagnosis present

## 2017-07-10 DIAGNOSIS — C3491 Malignant neoplasm of unspecified part of right bronchus or lung: Secondary | ICD-10-CM

## 2017-07-10 DIAGNOSIS — Z923 Personal history of irradiation: Secondary | ICD-10-CM | POA: Diagnosis not present

## 2017-07-10 DIAGNOSIS — Z9221 Personal history of antineoplastic chemotherapy: Secondary | ICD-10-CM

## 2017-07-10 DIAGNOSIS — C3490 Malignant neoplasm of unspecified part of unspecified bronchus or lung: Secondary | ICD-10-CM

## 2017-07-10 NOTE — Progress Notes (Signed)
Chase Herrera, [place] [id number]   CLINIC:  Medical Oncology/Hematology  PCP:  Chase Herrera, Chase Herrera [phone]   REASON FOR VISIT:  Follow-up for small cell lung cancer.  CURRENT THERAPY: Observation.  BRIEF ONCOLOGIC HISTORY:    Small cell lung cancer ([place])   04/03/2016 Initial Diagnosis    Small cell lung cancer ([place])      04/09/2016 Imaging    MRI brain- Negative for metastatic disease to the brain. No acute abnormality.      04/09/2016 Procedure    Right IJ port catheter placement by IR      04/13/2016 PET scan    . The right apical lung mass has significantly enlarged and there is new right paratracheal adenopathy as well as a hypermetabolic new lymph node in the right inner infraclavicular fossa potentially with early impingement on adjacent neurovascular structures. 2. Other imaging findings of potential clinical significance: Chronic bilateral maxillary sinusitis. Left carotid atherosclerotic calcification. Aortoiliac atherosclerotic vascular disease. Coronary atherosclerosis. Paraseptal emphysema. Mild atelectasis in the lung bases. Scattered sigmoid colon diverticula. Probable right pars defect at L5.      05/09/2016 - 07/12/2016 Radiation Therapy    Radiotherapy to right Lung+MS 63 Gy/35 fractions at 1.8 GY/f using 6 x photons 3- D CRT technique Dates: 4/18-6/21/18          06/19/2016 Treatment Plan Change    Cisplatin dose reduced by 20% for cycle #4 due to severe thrombocytopenia following cycle #3 (13,000).      08/21/2016 PET scan    1. Marked reduction in size and metabolic activity of RIGHT upper lobe mass. 2. Resolution of mediastinal and RIGHT supraclavicular lymphadenopathy. 3. No evidence of disease progression.        CANCER STAGING: Cancer Staging Small cell lung cancer ([place]) Staging form: Lung, AJCC 8th Edition - Clinical: Stage IIIC (cT3, cN3, cM0)  - Signed by Chase First, MD on 04/16/2016    INTERVAL HISTORY:  Chase Herrera 67 y.o. male returns for surveillance of small cell lung cancer.  Denies any chest pains, cough or hemoptysis.  He quit smoking and drinking alcohol 4 years ago.  Denies any headaches, nausea, vision changes.  No weight loss was reported.  His functional status has improved since he started doing physical therapy.  He has some trouble swallowing solid foods.  Energy levels described as 75%.    REVIEW OF SYSTEMS:  Review of Systems  HENT:   Positive for trouble swallowing.   Cardiovascular: Positive for leg swelling.  Gastrointestinal: Positive for constipation.  Hematological: Bruises/bleeds easily.  All other systems reviewed and are negative.    PAST MEDICAL/SURGICAL HISTORY:  Past Medical History:  Diagnosis Date  . Alcohol abuse    Quit 08/23/11  . Arthritis   . Atrial fibrillation ([place])   . Atrial flutter ([place])    CTI ablation by Dr Chase Herrera 10/2011  . Bacteremia due to Gram-positive bacteria 11/29/2016  . COPD (chronic obstructive pulmonary disease) ([place])   . Depression   . Dysrhythmia    AFib  . Essential hypertension   . History of cardiomyopathy    LVEF 25-30% 08/2011 with subsequent normalization  . History of kidney stones   . Obesity   . Prostate enlargement   . Small cell lung cancer ([place])    Right upper lobe - follows with Chase Herrera  . Type 2 diabetes mellitus ([place])  Past Surgical History:  Procedure Laterality Date  . APPENDECTOMY    . ATRIAL ABLATION SURGERY  11/06/2011   CTI ablation for atrial flutter by Dr Chase Herrera  . ATRIAL FLUTTER ABLATION N/A 11/06/2011   Procedure: ATRIAL FLUTTER ABLATION;  Surgeon: Chase Grayer, MD;  Location: [facility] CATH LAB;  Service: Cardiovascular;  Laterality: N/A;  . Cataracts    . COLONOSCOPY WITH PROPOFOL N/A 06/06/2017   Procedure: COLONOSCOPY WITH PROPOFOL;  Surgeon: Chase Dolin, MD;  Location: [facility];  Service: Endoscopy;   Laterality: N/A;  1:15pm  . IR GENERIC HISTORICAL  04/09/2016   IR US GUIDE VASC ACCESS RIGHT 04/09/2016 Chase Mckusick, DO Chase Herrera  . IR GENERIC HISTORICAL  04/09/2016   IR FLUORO GUIDE PORT INSERTION RIGHT 04/09/2016 Chase Mckusick, DO Chase Herrera  . PORT-A-CATH REMOVAL N/A 04/24/2017   Procedure: MINOR REMOVAL PORT-A-CATH;  Surgeon: Chase Cagey, MD;  Location: Chase [facility];  Service: General;  Laterality: N/A;  . TONSILLECTOMY       SOCIAL HISTORY:  Social History   Socioeconomic History  . Marital status: Single    Spouse name: Not on file  . Number of children: Not on file  . Years of education: Not on file  . Highest education level: Not on file  Occupational History  . Occupation: retired     Comment: Chase Herrera  . Financial resource strain: Somewhat hard  . Food insecurity:    Worry: Patient refused    Inability: Patient refused  . Transportation needs:    Medical: Patient refused    Non-medical: Patient refused  Tobacco Use  . Smoking status: Former Smoker    Packs/day: 0.50    Years: 47.00    Pack years: 23.50    Types: Cigarettes    Last attempt to quit: 05/30/2012    Years since quitting: 5.1  . Smokeless tobacco: Former Systems developer    Quit date: 04/10/2014  . Tobacco comment: encouraged to quit today 03/04/12  Substance and Sexual Activity  . Alcohol use: No    Frequency: Never    Comment: quit 3 1/2 years ago  . Drug use: Not Currently    Comment: marajuana occasionally  . Sexual activity: Not Currently  Lifestyle  . Physical activity:    Days per week: Patient refused    Minutes per session: Patient refused  . Stress: To some extent  Relationships  . Social connections:    Talks on phone: Patient refused    Gets together: Patient refused    Attends religious service: Patient refused    Active member of club or organization: Patient refused    Attends meetings of clubs or organizations: Patient refused    Relationship status: Patient  refused  . Intimate partner violence:    Fear of current or ex partner: Patient refused    Emotionally abused: Patient refused    Physically abused: Patient refused    Forced sexual activity: Patient refused  Other Topics Concern  . Not on file  Social History Narrative   Lives in [place] alone.   Disabled due to COPD   Previousliy worked as a Electrical Herrera    FAMILY HISTORY:  Family History  Problem Relation Age of Onset  . Diabetes Sister   . Hypertension Mother   . Lung cancer Mother   . Hypertension Father   . Heart attack Father   . Hypertension Sister   . Hypertension Sister   . Hypertension Sister   .  Pulmonary fibrosis Sister   . Breast cancer Cousin   . Colon cancer Neg Hx     CURRENT MEDICATIONS:  Outpatient Encounter Medications as of 07/10/2017  Medication Sig Note  . acetaminophen (TYLENOL) 325 MG tablet Take 650 mg by mouth every 6 (six) hours as needed for moderate pain or headache.    Marland Kitchen AQUALANCE LANCETS 30G MISC USE TO check blood glucose twice daily   . atorvastatin (LIPITOR) 20 MG tablet Take 1 tablet (20 mg total) by mouth daily.   . budesonide-formoterol (SYMBICORT) 160-4.5 MCG/ACT inhaler Inhale 2 puffs into the lungs 2 (two) times daily.    . Carboxymethylcellul-Glycerin (LUBRICATING EYE DROPS OP) Place 2 drops into both eyes daily as needed (dry eyes).   . cetirizine (ZYRTEC) 10 MG tablet Take 1 tablet (10 mg total) by mouth daily.   . digoxin (LANOXIN) 0.125 MG tablet Take 1 tablet (125 mcg total) by mouth daily.   Marland Kitchen diltiazem (CARDIZEM CD) 120 MG 24 hr capsule Take 120 mg by mouth daily. 05/27/2017: Total of 480 mg qd  . erythromycin ophthalmic ointment Place 1 application into the left eye 3 (three) times daily.    . fluticasone (FLONASE) 50 MCG/ACT nasal spray Place 2 sprays into both nostrils daily. (Patient taking differently: Place 2 sprays into both nostrils daily as needed for allergies. )   . furosemide (LASIX) 40 MG tablet TAKE 1  TABLET BY MOUTH EVERY DAY   . gabapentin (NEURONTIN) 400 MG capsule Take 1 capsule (400 mg total) by mouth 3 (three) times daily.   Marland Kitchen guaiFENesin (MUCINEX) 600 MG 12 hr tablet Take 1,200 mg by mouth daily as needed for cough.    . insulin glargine (LANTUS) 100 UNIT/ML injection Inject 0.25 mLs (25 Units total) into the skin 2 (two) times daily.   Marland Kitchen ipratropium-albuterol (DUONEB) 0.5-2.5 (3) MG/3ML SOLN Take 3 mLs by nebulization 4 (four) times daily. Only take when out of Xopenex   . ketorolac (ACULAR) 0.5 % ophthalmic solution Place 1 drop into the left eye 4 (four) times daily as needed for pain.   Marland Kitchen LANTUS SOLOSTAR 100 UNIT/ML Solostar Pen INJECT 20 UNITS UNDER THE SKIN EVERY MORNING AND INJECT 20 UNITS UNDER THE SKIN EVERY EVENING (Patient taking differently: Inject 25 units twice daily)   . levalbuterol (XOPENEX) 0.63 MG/3ML nebulizer solution Inhale 3 mLs (0.63 mg total) into the lungs 4 (four) times daily.   . metFORMIN (GLUCOPHAGE) 1000 MG tablet Take 1 tablet (1,000 mg total) by mouth 2 (two) times daily with a meal.   . metoprolol tartrate (LOPRESSOR) 50 MG tablet TAKE ONE TABLET BY MOUTH TWICE DAILY WITH A MEAL   . Multiple Vitamins-Minerals (COMPLETE MULTIVITAMIN/MINERAL PO) TAKE 1 TABLET BY MOUTH DAILY   . polyethylene glycol (MIRALAX / GLYCOLAX) packet Take 17 g by mouth daily as needed for severe constipation.   Marland Kitchen PROAIR HFA 108 (90 Base) MCG/ACT inhaler INHALE TWO PUFFS BY MOUTH EVERY 4 HOURS AS NEEDED FOR WHEEZING   . ROXICODONE 5 MG immediate release tablet Take 1 tablet (5 mg total) by mouth every 4 (four) hours as needed for severe pain or breakthrough pain.   . tamsulosin (FLOMAX) 0.4 MG CAPS capsule Take 1 capsule (0.4 mg total) by mouth 2 (two) times daily after a meal.   . tiotropium (SPIRIVA HANDIHALER) 18 MCG inhalation capsule Place 1 capsule (18 mcg total) into inhaler and inhale daily.   . traZODone (DESYREL) 50 MG tablet Take 50 mg by mouth at  bedtime.   Marland Kitchen UNABLE TO  FIND inhale THE contents of ONE vial EVERY 4 HOURS AS NEEDED (DX j44.9) INSERT will ONLY cover FOUR boxes PER MONTH   . rivaroxaban (XARELTO) 20 MG TABS tablet Take 1 tablet (20 mg total) by mouth daily.   . [DISCONTINUED] diltiazem (TIAZAC) 360 MG 24 hr capsule Take 360 mg by mouth daily.    No facility-administered encounter medications on file as of 07/10/2017.     ALLERGIES:  No Known Allergies   PHYSICAL EXAM:  ECOG Performance status: 1  Vitals:   07/10/17 1437  BP: 133/70  Pulse: 67  Resp: 18  Temp: 97.7 F (36.5 C)  SpO2: 95%   Filed Weights   07/10/17 1437  Weight: 258 lb (117 kg)    Physical Exam   LABORATORY DATA:  I have reviewed the labs as listed.  CBC    Component Value Date/Time   WBC 7.2 05/30/2017 1328   RBC 3.64 (L) 05/30/2017 1328   HGB 11.8 (L) 05/30/2017 1328   HCT 37.3 (L) 05/30/2017 1328   PLT 199 05/30/2017 1328   MCV 102.5 (H) 05/30/2017 1328   MCH 32.4 05/30/2017 1328   MCHC 31.6 05/30/2017 1328   RDW 14.5 05/30/2017 1328   LYMPHSABS 0.7 05/30/2017 1328   MONOABS 0.8 05/30/2017 1328   EOSABS 0.2 05/30/2017 1328   BASOSABS 0.0 05/30/2017 1328   CMP Latest Ref Rng & Units 06/03/2017 05/30/2017 04/29/2017  Glucose 65 - 99 mg/dL 144(H) 158(H) 283(H)  BUN 7 - 25 mg/dL 15 17 18   Creatinine 0.70 - 1.25 mg/dL 1.09 1.19 1.12  Sodium 135 - 146 mmol/L 140 139 137  Potassium 3.5 - 5.3 mmol/L 4.1 3.8 3.7  Chloride 98 - 110 mmol/L 102 101 96(L)  CO2 20 - 32 mmol/L 27 26 25   Calcium 8.6 - 10.3 mg/dL 9.2 9.1 9.3  Total Protein 6.5 - 8.1 g/dL - - -  Total Bilirubin 0.3 - 1.2 mg/dL - - -  Alkaline Phos 38 - 126 U/L - - -  AST 15 - 41 U/L - - -  ALT 17 - 63 U/L - - -       DIAGNOSTIC IMAGING:  I have independently reviewed CT scan of the chest images dated 07/01/2017 which shows radiation therapy changes to the right lung.     ASSESSMENT & PLAN:   Small cell lung cancer ([place]) 1.  Limited stage small cell lung cancer of right upper  lobe: -Status post chemoradiation therapy with cisplatin and VP-16 from April 16, 2016 through 06/21/2016, status post prophylactic cranial irradiation -Quit smoking and drinking 4 years ago. -Denies any change in his cough or hemoptysis.  Denies any fevers, night sweats or weight loss. -I have reviewed the results of the CT scan of the chest dated 07/01/2017 which shows radiation therapy changes in the right lung.  Colonoscopy on 06/06/2017 shows diverticulosis in sigmoid and descending colon and nonbleeding internal hemorrhoids. - We will do oncology follow-up visits every 3 months during Herrera 2 years with H&P, CT scan of the chest and blood work.  We will also do MRI of the brain every 6 months during year 2.      Orders placed this encounter:  No orders of the defined types were placed in this encounter.     Derek Jack, MD [place] [phone]

## 2017-07-10 NOTE — Telephone Encounter (Signed)
Spoke with patient and oncology is going to discuss results. He also stated he had received the letter stating Dr.Hagler is leaving our practice and he has already found another doctor. We should receive something from them requesting his medical records soon. He thanks Dr.Hagler for everything she has done and hates she is leaving.

## 2017-07-10 NOTE — Assessment & Plan Note (Signed)
1.  Limited stage small cell lung cancer of right upper lobe: -Status post chemoradiation therapy with cisplatin and VP-16 from April 16, 2016 through 06/21/2016, status post prophylactic cranial irradiation -Quit smoking and drinking 4 years ago. -Denies any change in his cough or hemoptysis.  Denies any fevers, night sweats or weight loss. -I have reviewed the results of the CT scan of the chest dated 07/01/2017 which shows radiation therapy changes in the right lung.  Colonoscopy on 06/06/2017 shows diverticulosis in sigmoid and descending colon and nonbleeding internal hemorrhoids. - We will do oncology follow-up visits every 3 months during first 2 years with H&P, CT scan of the chest and blood work.  We will also do MRI of the brain every 6 months during year 2.

## 2017-07-17 ENCOUNTER — Other Ambulatory Visit: Payer: Self-pay | Admitting: Family Medicine

## 2017-07-17 DIAGNOSIS — I482 Chronic atrial fibrillation, unspecified: Secondary | ICD-10-CM

## 2017-07-24 ENCOUNTER — Other Ambulatory Visit: Payer: Self-pay | Admitting: Family Medicine

## 2017-09-03 ENCOUNTER — Ambulatory Visit: Payer: Medicare Other | Admitting: Family Medicine

## 2017-10-07 ENCOUNTER — Other Ambulatory Visit (HOSPITAL_COMMUNITY): Payer: Self-pay | Admitting: Nurse Practitioner

## 2017-10-07 ENCOUNTER — Ambulatory Visit (HOSPITAL_COMMUNITY): Admission: RE | Admit: 2017-10-07 | Payer: Medicare Other | Source: Ambulatory Visit

## 2017-10-07 ENCOUNTER — Other Ambulatory Visit (HOSPITAL_COMMUNITY): Payer: Medicare Other

## 2017-10-07 DIAGNOSIS — C3490 Malignant neoplasm of unspecified part of unspecified bronchus or lung: Secondary | ICD-10-CM

## 2017-10-15 ENCOUNTER — Ambulatory Visit (HOSPITAL_COMMUNITY): Payer: Medicare Other | Admitting: Internal Medicine

## 2017-10-16 ENCOUNTER — Inpatient Hospital Stay (HOSPITAL_COMMUNITY): Payer: Medicare Other | Attending: Nurse Practitioner

## 2017-10-16 DIAGNOSIS — C3491 Malignant neoplasm of unspecified part of right bronchus or lung: Secondary | ICD-10-CM

## 2017-10-16 DIAGNOSIS — Z85118 Personal history of other malignant neoplasm of bronchus and lung: Secondary | ICD-10-CM | POA: Insufficient documentation

## 2017-10-16 DIAGNOSIS — Z87891 Personal history of nicotine dependence: Secondary | ICD-10-CM | POA: Diagnosis not present

## 2017-10-16 DIAGNOSIS — Z9221 Personal history of antineoplastic chemotherapy: Secondary | ICD-10-CM | POA: Diagnosis not present

## 2017-10-16 DIAGNOSIS — K648 Other hemorrhoids: Secondary | ICD-10-CM | POA: Diagnosis not present

## 2017-10-16 DIAGNOSIS — Z923 Personal history of irradiation: Secondary | ICD-10-CM | POA: Insufficient documentation

## 2017-10-16 DIAGNOSIS — K5730 Diverticulosis of large intestine without perforation or abscess without bleeding: Secondary | ICD-10-CM | POA: Diagnosis not present

## 2017-10-16 LAB — COMPREHENSIVE METABOLIC PANEL
ALT: 44 U/L (ref 0–44)
AST: 26 U/L (ref 15–41)
Albumin: 3.5 g/dL (ref 3.5–5.0)
Alkaline Phosphatase: 52 U/L (ref 38–126)
Anion gap: 11 (ref 5–15)
BUN: 29 mg/dL — ABNORMAL HIGH (ref 8–23)
CO2: 25 mmol/L (ref 22–32)
Calcium: 8.5 mg/dL — ABNORMAL LOW (ref 8.9–10.3)
Chloride: 102 mmol/L (ref 98–111)
Creatinine, Ser: 1.24 mg/dL (ref 0.61–1.24)
GFR calc Af Amer: >60 mL/min (ref >60)
GFR calc non Af Amer: 59 mL/min — ABNORMAL LOW (ref >60)
Glucose, Bld: 233 mg/dL — ABNORMAL HIGH (ref 70–99)
Potassium: 3.7 mmol/L (ref 3.5–5.1)
Sodium: 138 mmol/L (ref 135–145)
Total Bilirubin: 0.6 mg/dL (ref 0.3–1.2)
Total Protein: 6.5 g/dL (ref 6.5–8.1)

## 2017-10-16 LAB — CBC WITH DIFFERENTIAL/PLATELET
Basophils Absolute: 0 10*3/uL (ref 0.0–0.1)
Basophils Relative: 0 %
Eosinophils Absolute: 0.1 10*3/uL (ref 0.0–0.7)
Eosinophils Relative: 1 %
HCT: 38.7 % — ABNORMAL LOW (ref 39.0–52.0)
Hemoglobin: 12.1 g/dL — ABNORMAL LOW (ref 13.0–17.0)
Lymphocytes Relative: 6 %
Lymphs Abs: 0.5 10*3/uL — ABNORMAL LOW (ref 0.7–4.0)
MCH: 32.3 pg (ref 26.0–34.0)
MCHC: 31.3 g/dL (ref 30.0–36.0)
MCV: 103.2 fL — ABNORMAL HIGH (ref 78.0–100.0)
Monocytes Absolute: 1.1 10*3/uL — ABNORMAL HIGH (ref 0.1–1.0)
Monocytes Relative: 12 %
Neutro Abs: 7.3 10*3/uL (ref 1.7–7.7)
Neutrophils Relative %: 81 %
Platelets: 164 10*3/uL (ref 150–400)
RBC: 3.75 MIL/uL — ABNORMAL LOW (ref 4.22–5.81)
RDW: 14.5 % (ref 11.5–15.5)
WBC: 9 10*3/uL (ref 4.0–10.5)

## 2017-10-23 ENCOUNTER — Ambulatory Visit (HOSPITAL_COMMUNITY): Admission: RE | Admit: 2017-10-23 | Payer: Medicare Other | Source: Ambulatory Visit

## 2017-10-23 NOTE — Progress Notes (Addendum)
Cardiology Office Note    Date:  10/28/2017   ID:  Chase, Herrera 21-Mar-1950, MRN [id number]  PCP:  Chase Haste, NP  Cardiologist: [name], MD EPS:   Dr. [facility]  Chief Complaint  Patient presents with  . Hospitalization Follow-up    History of Present Illness:  Chase Herrera is a 67 y.o. male with history of atrial flutter seen by Dr. Willis Modena and it was decided that device placement and/or ablation were not indicated.  Plan was for rate control.  Denies history of nonischemic cardiomyopathy LVEF normalized on echo 10/2016, severe O2 dependent COPD with chronic hypoxic respiratory failure and small cell lung CA with brain mets.  Since I Dr. [name] 05/24/2017 which time he was doing well.  Patient saw Dr. Olevia Bowens cardiologist at [facility] 09/27/2017 at which time he was in acute on chronic CHF and atrial fibrillation with rapid rates.  He was admitted to the hospital at [facility]. Patient says it's because he was eating a lot of corn chips and salt.We are trying to get records. Says they cardioverted him and got about 12 Liters of fluid off of him. Back in ER on Sunday and given solumedrol for pulmonary problems.  He carries a pulse oximeter with him and keep track of his heart rate and O2 sats.  He is trying to do better with his eating but had fried chicken livers before he came here today.  Also drinks a fair amount of caffeine.  Past Medical History:  Diagnosis Date  . Alcohol abuse    Quit 08/23/11  . Arthritis   . Atrial fibrillation ([place])   . Atrial flutter ([place])    CTI ablation by Dr [name] 10/2011  . Bacteremia due to Gram-positive bacteria 11/29/2016  . COPD (chronic obstructive pulmonary disease) ([place])   . Depression   . Dysrhythmia    AFib  . Essential hypertension   . History of cardiomyopathy    LVEF 25-30% 08/2011 with subsequent normalization  . History of kidney stones   . Obesity   . Prostate enlargement   . Small cell lung cancer  ([place])    Right upper lobe - follows with [facility]  . Type 2 diabetes mellitus ([place])     Past Surgical History:  Procedure Laterality Date  . APPENDECTOMY    . ATRIAL ABLATION SURGERY  11/06/2011   CTI ablation for atrial flutter by Dr [name]  . ATRIAL FLUTTER ABLATION N/A 11/06/2011   Procedure: ATRIAL FLUTTER ABLATION;  Surgeon: Thompson Grayer, MD;  Location: [facility] CATH LAB;  Service: Cardiovascular;  Laterality: N/A;  . Cataracts    . COLONOSCOPY WITH PROPOFOL N/A 06/06/2017   Procedure: COLONOSCOPY WITH PROPOFOL;  Surgeon: [name], MD;  Location: [facility] ENDO SUITE;  Service: Endoscopy;  Laterality: N/A;  1:15pm  . IR GENERIC HISTORICAL  04/09/2016   IR US GUIDE VASC ACCESS RIGHT 04/09/2016 [name], DO [facility]-INTERV RAD  . IR GENERIC HISTORICAL  04/09/2016   IR FLUORO GUIDE PORT INSERTION RIGHT 04/09/2016 [name], DO [facility]  . PORT-A-CATH REMOVAL N/A 04/24/2017   Procedure: MINOR REMOVAL PORT-A-CATH;  Surgeon: [name], MD;  Location: [facility] ORS;  Service: General;  Laterality: N/A;  . TONSILLECTOMY      Current Medications: Current Meds  Medication Sig  . acetaminophen (TYLENOL) 325 MG tablet Take 650 mg by mouth every 6 (six) hours as needed for moderate pain or headache.   Marland Kitchen  AQUALANCE LANCETS 30G MISC USE TO check blood glucose twice daily  . atorvastatin (LIPITOR) 20 MG tablet Take 1 tablet (20 mg total) by mouth daily.  . budesonide-formoterol (SYMBICORT) 160-4.5 MCG/ACT inhaler Inhale 2 puffs into the lungs 2 (two) times daily.   . Carboxymethylcellul-Glycerin (LUBRICATING EYE DROPS OP) Place 2 drops into both eyes daily as needed (dry eyes).  . cetirizine (ZYRTEC) 10 MG tablet Take 1 tablet (10 mg total) by mouth daily.  Marland Kitchen diltiazem (CARDIZEM CD) 120 MG 24 hr capsule Take 120 mg by mouth daily.  Marland Kitchen erythromycin ophthalmic ointment Place 1 application into the left eye 3 (three) times daily.   . fluticasone (FLONASE) 50 MCG/ACT nasal spray Place 2 sprays  into both nostrils daily. (Patient taking differently: Place 2 sprays into both nostrils daily as needed for allergies. )  . furosemide (LASIX) 40 MG tablet TAKE 1 TABLET BY MOUTH EVERY DAY  . gabapentin (NEURONTIN) 400 MG capsule Take 1 capsule (400 mg total) by mouth 3 (three) times daily.  Marland Kitchen guaiFENesin (MUCINEX) 600 MG 12 hr tablet Take 1,200 mg by mouth daily as needed for cough.   . insulin glargine (LANTUS) 100 UNIT/ML injection Inject 0.25 mLs (25 Units total) into the skin 2 (two) times daily.  Marland Kitchen ipratropium-albuterol (DUONEB) 0.5-2.5 (3) MG/3ML SOLN Take 3 mLs by nebulization 4 (four) times daily. Only take when out of Xopenex  . ketorolac (ACULAR) 0.5 % ophthalmic solution Place 1 drop into the left eye 4 (four) times daily as needed for pain.  Marland Kitchen LANTUS SOLOSTAR 100 UNIT/ML Solostar Pen INJECT 20 UNITS UNDER THE SKIN EVERY MORNING AND INJECT 20 UNITS UNDER THE SKIN EVERY EVENING (Patient taking differently: Inject 25 units twice daily)  . levalbuterol (XOPENEX) 0.63 MG/3ML nebulizer solution Inhale 3 mLs (0.63 mg total) into the lungs 4 (four) times daily.  . metFORMIN (GLUCOPHAGE) 1000 MG tablet Take 1 tablet (1,000 mg total) by mouth 2 (two) times daily with a meal.  . metoprolol tartrate (LOPRESSOR) 50 MG tablet TAKE ONE TABLET BY MOUTH TWICE DAILY WITH A MEAL  . Multiple Vitamins-Minerals (COMPLETE MULTIVITAMIN/MINERAL PO) TAKE 1 TABLET BY MOUTH DAILY  . polyethylene glycol (MIRALAX / GLYCOLAX) packet Take 17 g by mouth daily as needed for severe constipation.  Marland Kitchen PROAIR HFA 108 (90 Base) MCG/ACT inhaler INHALE TWO PUFFS BY MOUTH EVERY 4 HOURS AS NEEDED FOR WHEEZING  . ROXICODONE 5 MG immediate release tablet Take 1 tablet (5 mg total) by mouth every 4 (four) hours as needed for severe pain or breakthrough pain.  . tamsulosin (FLOMAX) 0.4 MG CAPS capsule Take 1 capsule (0.4 mg total) by mouth 2 (two) times daily after a meal.  . tiotropium (SPIRIVA HANDIHALER) 18 MCG inhalation capsule  Place 1 capsule (18 mcg total) into inhaler and inhale daily.  . traZODone (DESYREL) 50 MG tablet Take 50 mg by mouth at bedtime.  Marland Kitchen UNABLE TO FIND inhale THE contents of ONE vial EVERY 4 HOURS AS NEEDED (DX j44.9) INSERT will ONLY cover FOUR boxes PER MONTH     Allergies:   Patient has no known allergies.   Social History   Socioeconomic History  . Marital status: Single    Spouse name: Not on file  . Number of children: Not on file  . Years of education: Not on file  . Highest education level: Not on file  Occupational History  . Occupation: retired     Comment: Network engineer  . Emergency planning/management officer  strain: Somewhat hard  . Food insecurity:    Worry: Patient refused    Inability: Patient refused  . Transportation needs:    Medical: Patient refused    Non-medical: Patient refused  Tobacco Use  . Smoking status: Former Smoker    Packs/day: 0.50    Years: 47.00    Pack years: 23.50    Types: Cigarettes    Last attempt to quit: 05/30/2012    Years since quitting: 5.4  . Smokeless tobacco: Former Systems developer    Quit date: 04/10/2014  . Tobacco comment: encouraged to quit today 03/04/12  Substance and Sexual Activity  . Alcohol use: No    Frequency: Never    Comment: quit 3 1/2 years ago  . Drug use: Not Currently    Comment: marajuana occasionally  . Sexual activity: Not Currently  Lifestyle  . Physical activity:    Days per week: Patient refused    Minutes per session: Patient refused  . Stress: To some extent  Relationships  . Social connections:    Talks on phone: Patient refused    Gets together: Patient refused    Attends religious service: Patient refused    Active member of club or organization: Patient refused    Attends meetings of clubs or organizations: Patient refused    Relationship status: Patient refused  Other Topics Concern  . Not on file  Social History Narrative   Lives in [place] alone.   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   ROS:   Please see the history of present illness.    Review of Systems  Cardiovascular: Positive for dyspnea on exertion.  Respiratory: Positive for shortness of breath.    All other systems reviewed and are negative.   PHYSICAL EXAM:   VS:  BP 140/76 (BP Location: Right Arm)   Pulse 91   Ht 6\' 1"  (1.854 m)   Wt 253 lb (114.8 kg)   SpO2 91%   BMI 33.38 kg/m   Physical Exam  GEN: Obese, in a wheelchair, on oxygen, in no acute distress  Neck: no JVD, carotid bruits, or masses Cardiac:RRR; no murmurs, rubs, or gallops  Respiratory: Decreased breath sounds but clear to auscultation bilaterally, normal work of breathing GI: soft, nontender, nondistended, + BS Ext: Trace of left ankle edema, none on the right, good distal pulses. Neuro:  Alert and Oriented x 3 Psych: euthymic mood, full affect  Wt Readings from Last 3 Encounters:  10/28/17 253 lb (114.8 kg)  07/10/17 258 lb (117 kg)  06/03/17 257 lb (116.6 kg)      Studies/Labs Reviewed:   EKG:  EKG is notordered today.  Awaiting records from hospitalization to review. Recent Labs: 12/02/2016: Magnesium 1.8 10/16/2017: ALT 44; BUN 29; Creatinine, Ser 1.24; Hemoglobin 12.1; Platelets 164; Potassium 3.7; Sodium 138   Lipid Panel    Component Value Date/Time   CHOL 100 06/03/2017 1124   TRIG 103 06/03/2017 1124   HDL 40 (L) 06/03/2017 1124   CHOLHDL 2.5 06/03/2017 1124   LDLCALC 41 06/03/2017 1124    Additional studies/ records that were reviewed today include:   Echocardiogram 11/15/2016: Study Conclusions   - Left ventricle: The  cavity size was normal. Wall thickness was   normal. Systolic function was normal. The estimated  ejection   fraction was in the range of 60% to 65%. Left ventricular   diastolic function parameters were normal. - Aortic valve: Valve area (VTI): 3.65 cm^2. Valve area (Vmax):   3.42 cm^2. Valve area (Vmean): 3.4 cm^2.    2D echo 09/27/2017 [facility] LVEF 55 to 60% no regional wall motion abnormalities, left atrium size was normal RV size and function is normal no major valvular abnormalities.  EKG from 10/27/2017 poor tracing looks like normal sinus rhythm nonspecific ST-T wave changes.   ASSESSMENT:    1. Atypical atrial flutter ([place])   2. Chronic diastolic CHF (congestive heart failure) ([place])   3. Nonischemic cardiomyopathy ([place])   4. Essential hypertension   5. Chronic obstructive pulmonary disease, unspecified COPD type ([place])   6. Small cell lung cancer ([place])      PLAN:  In order of problems listed above:  Atypical atrial flutter followed at [facility] by [place] admission to the hospital 09/27/2017 with acute on chronic CHF with uncontrolled atrial flutter.  He underwent cardioversion.  Trying to obtain records to review.  Back in the ER yesterday given Solu-Medrol.  Continue Xarelto, diltiazem, metoprolol.  Has follow-up with Dr.Gehi in November. F/u with Dr. [name] in 3 months.  Chronic diastolic CHF  Patient's heart failure is compensated today.  His weight is 253 pounds the same as it was when he saw Dr. [name] in May.  No heart failure on exam.  Continue Lasix 40 mg once daily.  2 g sodium diet.  Nonischemic cardiomyopathy ejection fraction 60 to 65% on echo 10/2016 but repeat echo done 09/2017-awaiting results.  Patient was told his heart function was strong.  Essential hypertension blood pressure stable today  COPD on home O2  Small cell lung CA-followed by oncology  Addendum ER visit 10/18/2017 chest x-ray without acute process, creatinine 1.4131. GERD reviewed from hospitalization 09/30/2017 and he was in fact cardioverted to normal sinus rhythm.  CT the chest  showed volume loss scarring and pleural thickening in the right apex consistent with postradiation changes in the emphysema with chronic bronchitic changes, no active pulmonary disease.  2D echo 09/27/2017 LVEF 55 to 60% no regional wall motion abnormalities, left atrium size was normal RV size and function is normal no major valvular abnormalities.  EKG from 10/27/2017 poor tracing looks like normal sinus rhythm nonspecific ST-T wave changes.  Medication Adjustments/Labs and Tests Ordered: Current medicines are reviewed at length with the patient today.  Concerns regarding medicines are outlined above.  Medication changes, Labs and Tests ordered today are listed in the Patient Instructions below. Patient Instructions   Medication Instructions:  Your physician recommends that you continue on your current medications as directed. Please refer to the Current Medication list given to you today.   Labwork: none  Testing/Procedures: none  Follow-Up: Your physician recommends that you schedule a follow-up appointment in: 3 months  Any Other Special Instructions Will Be Listed Below (If Applicable).      If you need a refill on your cardiac medications before your next appointment, please call your pharmacy. Two Gram Sodium Diet 2000 mg  What is Sodium? Sodium is a mineral found naturally in many foods. The most significant source of sodium in the diet is table salt, which is about 40% sodium.  Processed, convenience, and preserved foods also contain a large amount of sodium.  The body needs only 500  mg of sodium daily to function,  A normal diet provides more than enough sodium even if you do not use salt.  Why Limit Sodium? A build up of sodium in the body can cause thirst, increased blood pressure, shortness of breath, and water retention.  Decreasing sodium in the diet can reduce edema and risk of heart attack or stroke associated with high blood pressure.  Keep in mind that there are many  other factors involved in these health problems.  Heredity, obesity, lack of exercise, cigarette smoking, stress and what you eat all play a role.  General Guidelines:  Do not add salt at the table or in cooking.  One teaspoon of salt contains over 2 grams of sodium.  Read food labels  Avoid processed and convenience foods  Ask your dietitian before eating any foods not dicussed in the menu planning guidelines  Consult your physician if you wish to use a salt substitute or a sodium containing medication such as antacids.  Limit milk and milk products to 16 oz (2 cups) per day.  Shopping Hints:  READ LABELS!! "Dietetic" does not necessarily mean low sodium.  Salt and other sodium ingredients are often added to foods during processing.   Menu Planning Guidelines Food Group Choose More Often Avoid  Beverages (see also the milk group All fruit juices, low-sodium, salt-free vegetables juices, low-sodium carbonated beverages Regular vegetable or tomato juices, commercially softened water used for drinking or cooking  Breads and Cereals Enriched white, wheat, rye and pumpernickel bread, hard rolls and dinner rolls; muffins, cornbread and waffles; most dry cereals, cooked cereal without added salt; unsalted crackers and breadsticks; low sodium or homemade bread crumbs Bread, rolls and crackers with salted tops; quick breads; instant hot cereals; pancakes; commercial bread stuffing; self-rising flower and biscuit mixes; regular bread crumbs or cracker crumbs  Desserts and Sweets Desserts and sweets mad with mild should be within allowance Instant pudding mixes and cake mixes  Fats Butter or margarine; vegetable oils; unsalted salad dressings, regular salad dressings limited to 1 Tbs; light, sour and heavy cream Regular salad dressings containing bacon fat, bacon bits, and salt pork; snack dips made with instant soup mixes or processed cheese; salted nuts  Fruits Most fresh, frozen and canned fruits  Fruits processed with salt or sodium-containing ingredient (some dried fruits are processed with sodium sulfites        Vegetables Fresh, frozen vegetables and low- sodium canned vegetables Regular canned vegetables, sauerkraut, pickled vegetables, and others prepared in brine; frozen vegetables in sauces; vegetables seasoned with ham, bacon or salt pork  Condiments, Sauces, Miscellaneous  Salt substitute with physician's approval; pepper, herbs, spices; vinegar, lemon or lime juice; hot pepper sauce; garlic powder, onion powder, low sodium soy sauce (1 Tbs.); low sodium condiments (ketchup, chili sauce, mustard) in limited amounts (1 tsp.) fresh ground horseradish; unsalted tortilla chips, pretzels, potato chips, popcorn, salsa (1/4 cup) Any seasoning made with salt including garlic salt, celery salt, onion salt, and seasoned salt; sea salt, rock salt, kosher salt; meat tenderizers; monosodium glutamate; mustard, regular soy sauce, barbecue, sauce, chili sauce, teriyaki sauce, steak sauce, Worcestershire sauce, and most flavored vinegars; canned gravy and mixes; regular condiments; salted snack foods, olives, picles, relish, horseradish sauce, catsup   Food preparation: Try these seasonings Meats:    Pork Sage, onion Serve with applesauce  Chicken Poultry seasoning, thyme, parsley Serve with cranberry sauce  Lamb Curry powder, rosemary, garlic, thyme Serve with mint sauce or jelly  Veal Marjoram,  basil Serve with current jelly, cranberry sauce  Beef Pepper, bay leaf Serve with dry mustard, unsalted chive butter  Fish [place] leaf, dill Serve with unsalted lemon butter, unsalted parsley butter  Vegetables:    Asparagus Lemon juice   Broccoli Lemon juice   Carrots Mustard dressing parsley, mint, nutmeg, glazed with unsalted butter and sugar   Green beans Marjoram, lemon juice, nutmeg,dill seed   Tomatoes Basil, marjoram, onion   Spice /blend for [employer]" 4 tsp ground thyme 1 tsp ground sage  3 tsp ground rosemary 4 tsp ground marjoram   Test your knowledge 1. A product that says "Salt Free" may still contain sodium. True or False 2. Garlic Powder and Hot Pepper Sauce an be used as alternative seasonings.True or False 3. Processed foods have more sodium than fresh foods.  True or False 4. Canned Vegetables have less sodium than froze True or False  WAYS TO DECREASE YOUR SODIUM INTAKE 1. Avoid the use of added salt in cooking and at the table.  Table salt (and other prepared seasonings which contain salt) is probably one of the greatest sources of sodium in the diet.  Unsalted foods can gain flavor from the sweet, sour, and butter taste sensations of herbs and spices.  Instead of using salt for seasoning, try the following seasonings with the foods listed.  Remember: how you use them to enhance natural food flavors is limited only by your creativity... Allspice-Meat, fish, eggs, fruit, peas, red and yellow vegetables Almond Extract-Fruit baked goods Anise Seed-Sweet breads, fruit, carrots, beets, cottage cheese, cookies (tastes like licorice) Basil-Meat, fish, eggs, vegetables, rice, vegetables salads, soups, sauces Bay Leaf-Meat, fish, stews, poultry Burnet-Salad, vegetables (cucumber-like flavor) Caraway Seed-Bread, cookies, cottage cheese, meat, vegetables, cheese, rice Cardamon-Baked goods, fruit, soups Celery Powder or seed-Salads, salad dressings, sauces, meatloaf, soup, bread.Do not use  celery salt Chervil-Meats, salads, fish, eggs, vegetables, cottage cheese (parsley-like flavor) Chili Power-Meatloaf, chicken cheese, corn, eggplant, egg dishes Chives-Salads cottage cheese, egg dishes, soups, vegetables, sauces Cilantro-Salsa, casseroles Cinnamon-Baked goods, fruit, pork, lamb, chicken, carrots Cloves-Fruit, baked goods, fish, pot roast, green beans, beets, carrots Coriander-Pastry, cookies, meat, salads, cheese (lemon-orange flavor) Cumin-Meatloaf, fish,cheese, eggs,  cabbage,fruit pie (caraway flavor) [employer], fruit, eggs, fish, poultry, cottage cheese, vegetables Dill Seed-Meat, cottage cheese, poultry, vegetables, fish, salads, bread Fennel Seed-Bread, cookies, apples, pork, eggs, fish, beets, cabbage, cheese, Licorice-like flavor Garlic-(buds or powder) Salads, meat, poultry, fish, bread, butter, vegetables, potatoes.Do not  use garlic salt Ginger-Fruit, vegetables, baked goods, meat, fish, poultry Horseradish Root-Meet, vegetables, butter Lemon Juice or Extract-Vegetables, fruit, tea, baked goods, fish salads Mace-Baked goods fruit, vegetables, fish, poultry (taste like nutmeg) Maple Extract-Syrups Marjoram-Meat, chicken, fish, vegetables, breads, green salads (taste like Sage) Mint-Tea, lamb, sherbet, vegetables, desserts, carrots, cabbage Mustard, Dry or Seed-Cheese, eggs, meats, vegetables, poultry Nutmeg-Baked goods, fruit, chicken, eggs, vegetables, desserts Onion Powder-Meat, fish, poultry, vegetables, cheese, eggs, bread, rice salads (Do not use   Onion salt) Orange Extract-Desserts, baked goods Oregano-Pasta, eggs, cheese, onions, pork, lamb, fish, chicken, vegetables, green salads Paprika-Meat, fish, poultry, eggs, cheese, vegetables Parsley Flakes-Butter, vegetables, meat fish, poultry, eggs, bread, salads (certain forms may   Contain sodium Pepper-Meat fish, poultry, vegetables, eggs Peppermint Extract-Desserts, baked goods Poppy Seed-Eggs, bread, cheese, fruit dressings, baked goods, noodles, vegetables, cottage  [employer], poultry, meat, fish, cauliflower, turnips,eggs bread Saffron-Rice, bread, veal, chicken, fish, eggs Sage-Meat, fish, poultry, onions, eggplant, tomateos, pork, stews Savory-Eggs, salads, poultry, meat, rice, vegetables, soups, pork Tarragon-Meat, poultry, fish, eggs, butter,  vegetables (licorice-like flavor)  Thyme-Meat, poultry, fish, eggs, vegetables,  (clover-like flavor), sauces, soups Tumeric-Salads, butter, eggs, fish, rice, vegetables (saffron-like flavor) Vanilla Extract-Baked goods, candy Vinegar-Salads, vegetables, meat marinades Walnut Extract-baked goods, candy  2. Choose your Foods Wisely   The following is a list of foods to avoid which are high in sodium:  Meats-Avoid all smoked, canned, salt cured, dried and kosher meat and fish as well as Anchovies   [employer] meats:Bologna, Liverwurst, Pastrami Canned meat or fish  Marinated herring Caviar    Pepperoni Corned Beef   Pizza Dried chipped beef  Salami Frozen breaded fish or meat Salt pork Frankfurters or hot dogs  Sardines Gefilte fish   Sausage Ham (boiled ham, Proscuitto Smoked butt    spiced ham)   Spam      TV Dinners Vegetables Canned vegetables (Regular) Relish Canned mushrooms  Sauerkraut Olives    Tomato juice Pickles  Bakery and Dessert Products Canned puddings  Cream pies Cheesecake   Decorated cakes Cookies  Beverages/Juices Tomato juice, regular  Gatorade   V-8 vegetable juice, regular  Breads and Cereals Biscuit mixes   Salted potato chips, corn chips, pretzels Bread stuffing mixes  Salted crackers and rolls Pancake and waffle mixes Self-rising flour  Seasonings Accent    Meat sauces Barbecue sauce  Meat tenderizer Catsup    Monosodium glutamate (MSG) Celery salt   Onion salt Chili sauce   Prepared mustard Garlic salt   Salt, seasoned salt, sea salt Gravy mixes   Soy sauce Horseradish   Steak sauce Ketchup   Tartar sauce Lite salt    Teriyaki sauce Marinade mixes   Worcestershire sauce  Others Baking powder   Cocoa and cocoa mixes Baking soda   Commercial casserole mixes Candy-caramels, chocolate  Dehydrated soups    Bars, fudge,nougats  Instant rice and pasta mixes Canned broth or soup  Maraschino cherries Cheese, aged and processed cheese and cheese spreads  Learning Assessment Quiz  Indicated T (for True) or F  (for False) for each of the following statements:  1. _____ Fresh fruits and vegetables and unprocessed grains are generally low in sodium 2. _____ Water may contain a considerable amount of sodium, depending on the source 3. _____ You can always tell if a food is high in sodium by tasting it 4. _____ Certain laxatives my be high in sodium and should be avoided unless prescribed   by a physician or pharmacist 5. _____ Salt substitutes may be used freely by anyone on a sodium restricted diet 6. _____ Sodium is present in table salt, food additives and as a natural component of   most foods 7. _____ Table salt is approximately 90% sodium 8. _____ Limiting sodium intake may help prevent excess fluid accumulation in the body 9. _____ On a sodium-restricted diet, seasonings such as bouillon soy sauce, and    cooking wine should be used in place of table salt 10. _____ On an ingredient list, a product which lists monosodium glutamate as the first   ingredient is an appropriate food to include on a low sodium diet  Circle the best answer(s) to the following statements (Hint: there may be more than one correct answer)  11. On a low-sodium diet, some acceptable snack items are:    A. Olives  F. Bean dip   K. Grapefruit juice    B. Salted Pretzels G. Commercial Popcorn   L. Canned peaches    C. Carrot Sticks  H.  Bouillon   M. Unsalted nuts   D. [place] fries  I. Peanut butter crackers N. Salami   E. Sweet pickles J. Tomato Juice   O. Pizza  12.  Seasonings that may be used freely on a reduced - sodium diet include   A. Lemon wedges F.Monosodium glutamate K. Celery seed    B.Soysauce   G. Pepper   L. Mustard powder   C. Sea salt  H. Cooking wine  M. Onion flakes   D. Vinegar  E. Prepared horseradish N. Salsa   E. Sage   J. Worcestershire sauce  O. [address]       Sumner Boast, PA-C  10/28/2017 12:15 PM    [facility] [address], [place], [place]   [address] Phone: [phone]; Fax: [phone]

## 2017-10-25 ENCOUNTER — Ambulatory Visit (HOSPITAL_COMMUNITY): Payer: Medicare Other | Admitting: Internal Medicine

## 2017-10-28 ENCOUNTER — Ambulatory Visit (INDEPENDENT_AMBULATORY_CARE_PROVIDER_SITE_OTHER): Payer: Medicare Other | Admitting: Physician Assistant

## 2017-10-28 ENCOUNTER — Encounter

## 2017-10-28 ENCOUNTER — Encounter: Payer: Self-pay | Admitting: Physician Assistant

## 2017-10-28 VITALS — BP 140/76 | HR 91 | Ht 73.0 in | Wt 253.0 lb

## 2017-10-28 DIAGNOSIS — I428 Other cardiomyopathies: Secondary | ICD-10-CM | POA: Diagnosis not present

## 2017-10-28 DIAGNOSIS — I484 Atypical atrial flutter: Secondary | ICD-10-CM | POA: Diagnosis not present

## 2017-10-28 DIAGNOSIS — I10 Essential (primary) hypertension: Secondary | ICD-10-CM | POA: Diagnosis not present

## 2017-10-28 DIAGNOSIS — C3490 Malignant neoplasm of unspecified part of unspecified bronchus or lung: Secondary | ICD-10-CM

## 2017-10-28 DIAGNOSIS — J449 Chronic obstructive pulmonary disease, unspecified: Secondary | ICD-10-CM

## 2017-10-28 DIAGNOSIS — I5032 Chronic diastolic (congestive) heart failure: Secondary | ICD-10-CM | POA: Diagnosis not present

## 2017-10-28 NOTE — Patient Instructions (Addendum)
Medication Instructions:  Your physician recommends that you continue on your current medications as directed. Please refer to the Current Medication list given to you today.   Labwork: none  Testing/Procedures: none  Follow-Up: Your physician recommends that you schedule a follow-up appointment in: 3 months  Any Other Special Instructions Will Be Listed Below (If Applicable).      If you need a refill on your cardiac medications before your next appointment, please call your pharmacy. Two Gram Sodium Diet 2000 mg  What is Sodium? Sodium is a mineral found naturally in many foods. The most significant source of sodium in the diet is table salt, which is about 40% sodium.  Processed, convenience, and preserved foods also contain a large amount of sodium.  The body needs only 500 mg of sodium daily to function,  A normal diet provides more than enough sodium even if you do not use salt.  Why Limit Sodium? A build up of sodium in the body can cause thirst, increased blood pressure, shortness of breath, and water retention.  Decreasing sodium in the diet can reduce edema and risk of heart attack or stroke associated with high blood pressure.  Keep in mind that there are many other factors involved in these health problems.  Heredity, obesity, lack of exercise, cigarette smoking, stress and what you eat all play a role.  General Guidelines:  Do not add salt at the table or in cooking.  One teaspoon of salt contains over 2 grams of sodium.  Read food labels  Avoid processed and convenience foods  Ask your dietitian before eating any foods not dicussed in the menu planning guidelines  Consult your physician if you wish to use a salt substitute or a sodium containing medication such as antacids.  Limit milk and milk products to 16 oz (2 cups) per day.  Shopping Hints:  READ LABELS!! "Dietetic" does not necessarily mean low sodium.  Salt and other sodium ingredients are often added to  foods during processing.   Menu Planning Guidelines Food Group Choose More Often Avoid  Beverages (see also the milk group All fruit juices, low-sodium, salt-free vegetables juices, low-sodium carbonated beverages Regular vegetable or tomato juices, commercially softened water used for drinking or cooking  Breads and Cereals Enriched white, wheat, rye and pumpernickel bread, hard rolls and dinner rolls; muffins, cornbread and waffles; most dry cereals, cooked cereal without added salt; unsalted crackers and breadsticks; low sodium or homemade bread crumbs Bread, rolls and crackers with salted tops; quick breads; instant hot cereals; pancakes; commercial bread stuffing; self-rising flower and biscuit mixes; regular bread crumbs or cracker crumbs  Desserts and Sweets Desserts and sweets mad with mild should be within allowance Instant pudding mixes and cake mixes  Fats Butter or margarine; vegetable oils; unsalted salad dressings, regular salad dressings limited to 1 Tbs; light, sour and heavy cream Regular salad dressings containing bacon fat, bacon bits, and salt pork; snack dips made with instant soup mixes or processed cheese; salted nuts  Fruits Most fresh, frozen and canned fruits Fruits processed with salt or sodium-containing ingredient (some dried fruits are processed with sodium sulfites        Vegetables Fresh, frozen vegetables and low- sodium canned vegetables Regular canned vegetables, sauerkraut, pickled vegetables, and others prepared in brine; frozen vegetables in sauces; vegetables seasoned with ham, bacon or salt pork  Condiments, Sauces, Miscellaneous  Salt substitute with physician's approval; pepper, herbs, spices; vinegar, lemon or lime juice; hot pepper sauce; garlic powder,  onion powder, low sodium soy sauce (1 Tbs.); low sodium condiments (ketchup, chili sauce, mustard) in limited amounts (1 tsp.) fresh ground horseradish; unsalted tortilla chips, pretzels, potato chips,  popcorn, salsa (1/4 cup) Any seasoning made with salt including garlic salt, celery salt, onion salt, and seasoned salt; sea salt, rock salt, kosher salt; meat tenderizers; monosodium glutamate; mustard, regular soy sauce, barbecue, sauce, chili sauce, teriyaki sauce, steak sauce, Worcestershire sauce, and most flavored vinegars; canned gravy and mixes; regular condiments; salted snack foods, olives, picles, relish, horseradish sauce, catsup   Food preparation: Try these seasonings Meats:    Pork Sage, onion Serve with applesauce  Chicken Poultry seasoning, thyme, parsley Serve with cranberry sauce  Lamb Curry powder, rosemary, garlic, thyme Serve with mint sauce or jelly  Veal Marjoram, basil Serve with current jelly, cranberry sauce  Beef Pepper, bay leaf Serve with dry mustard, unsalted chive butter  Fish [place] leaf, dill Serve with unsalted lemon butter, unsalted parsley butter  Vegetables:    Asparagus Lemon juice   Broccoli Lemon juice   Carrots Mustard dressing parsley, mint, nutmeg, glazed with unsalted butter and sugar   Green beans Marjoram, lemon juice, nutmeg,dill seed   Tomatoes Basil, marjoram, onion   Spice /blend for [employer]" 4 tsp ground thyme 1 tsp ground sage 3 tsp ground rosemary 4 tsp ground marjoram   Test your knowledge 1. A product that says "Salt Free" may still contain sodium. True or False 2. Garlic Powder and Hot Pepper Sauce an be used as alternative seasonings.True or False 3. Processed foods have more sodium than fresh foods.  True or False 4. Canned Vegetables have less sodium than froze True or False  WAYS TO DECREASE YOUR SODIUM INTAKE 1. Avoid the use of added salt in cooking and at the table.  Table salt (and other prepared seasonings which contain salt) is probably one of the greatest sources of sodium in the diet.  Unsalted foods can gain flavor from the sweet, sour, and butter taste sensations of herbs and spices.  Instead of using salt for  seasoning, try the following seasonings with the foods listed.  Remember: how you use them to enhance natural food flavors is limited only by your creativity... Allspice-Meat, fish, eggs, fruit, peas, red and yellow vegetables Almond Extract-Fruit baked goods Anise Seed-Sweet breads, fruit, carrots, beets, cottage cheese, cookies (tastes like licorice) Basil-Meat, fish, eggs, vegetables, rice, vegetables salads, soups, sauces Bay Leaf-Meat, fish, stews, poultry Burnet-Salad, vegetables (cucumber-like flavor) Caraway Seed-Bread, cookies, cottage cheese, meat, vegetables, cheese, rice Cardamon-Baked goods, fruit, soups Celery Powder or seed-Salads, salad dressings, sauces, meatloaf, soup, bread.Do not use  celery salt Chervil-Meats, salads, fish, eggs, vegetables, cottage cheese (parsley-like flavor) Chili Power-Meatloaf, chicken cheese, corn, eggplant, egg dishes Chives-Salads cottage cheese, egg dishes, soups, vegetables, sauces Cilantro-Salsa, casseroles Cinnamon-Baked goods, fruit, pork, lamb, chicken, carrots Cloves-Fruit, baked goods, fish, pot roast, green beans, beets, carrots Coriander-Pastry, cookies, meat, salads, cheese (lemon-orange flavor) Cumin-Meatloaf, fish,cheese, eggs, cabbage,fruit pie (caraway flavor) [employer], fruit, eggs, fish, poultry, cottage cheese, vegetables Dill Seed-Meat, cottage cheese, poultry, vegetables, fish, salads, bread Fennel Seed-Bread, cookies, apples, pork, eggs, fish, beets, cabbage, cheese, Licorice-like flavor Garlic-(buds or powder) Salads, meat, poultry, fish, bread, butter, vegetables, potatoes.Do not  use garlic salt Ginger-Fruit, vegetables, baked goods, meat, fish, poultry Horseradish Root-Meet, vegetables, butter Lemon Juice or Extract-Vegetables, fruit, tea, baked goods, fish salads Mace-Baked goods fruit, vegetables, fish, poultry (taste like nutmeg) Maple Extract-Syrups Marjoram-Meat, chicken, fish, vegetables, breads, green  salads (taste like  Sage) Mint-Tea, lamb, sherbet, vegetables, desserts, carrots, cabbage Mustard, Dry or Seed-Cheese, eggs, meats, vegetables, poultry Nutmeg-Baked goods, fruit, chicken, eggs, vegetables, desserts Onion Powder-Meat, fish, poultry, vegetables, cheese, eggs, bread, rice salads (Do not use   Onion salt) Orange Extract-Desserts, baked goods Oregano-Pasta, eggs, cheese, onions, pork, lamb, fish, chicken, vegetables, green salads Paprika-Meat, fish, poultry, eggs, cheese, vegetables Parsley Flakes-Butter, vegetables, meat fish, poultry, eggs, bread, salads (certain forms may   Contain sodium Pepper-Meat fish, poultry, vegetables, eggs Peppermint Extract-Desserts, baked goods Poppy Seed-Eggs, bread, cheese, fruit dressings, baked goods, noodles, vegetables, cottage  [employer], poultry, meat, fish, cauliflower, turnips,eggs bread Saffron-Rice, bread, veal, chicken, fish, eggs Sage-Meat, fish, poultry, onions, eggplant, tomateos, pork, stews Savory-Eggs, salads, poultry, meat, rice, vegetables, soups, pork Tarragon-Meat, poultry, fish, eggs, butter, vegetables (licorice-like flavor)  Thyme-Meat, poultry, fish, eggs, vegetables, (clover-like flavor), sauces, soups Tumeric-Salads, butter, eggs, fish, rice, vegetables (saffron-like flavor) Vanilla Extract-Baked goods, candy Vinegar-Salads, vegetables, meat marinades Walnut Extract-baked goods, candy  2. Choose your Foods Wisely   The following is a list of foods to avoid which are high in sodium:  Meats-Avoid all smoked, canned, salt cured, dried and kosher meat and fish as well as Anchovies   [employer] meats:Bologna, Liverwurst, Pastrami Canned meat or fish  Marinated herring Caviar    Pepperoni Corned Beef   Pizza Dried chipped beef  Salami Frozen breaded fish or meat Salt pork Frankfurters or hot dogs  Sardines Gefilte fish   Sausage Ham (boiled ham,  Proscuitto Smoked butt    spiced ham)   Spam      TV Dinners Vegetables Canned vegetables (Regular) Relish Canned mushrooms  Sauerkraut Olives    Tomato juice Pickles  Bakery and Dessert Products Canned puddings  Cream pies Cheesecake   Decorated cakes Cookies  Beverages/Juices Tomato juice, regular  Gatorade   V-8 vegetable juice, regular  Breads and Cereals Biscuit mixes   Salted potato chips, corn chips, pretzels Bread stuffing mixes  Salted crackers and rolls Pancake and waffle mixes Self-rising flour  Seasonings Accent    Meat sauces Barbecue sauce  Meat tenderizer Catsup    Monosodium glutamate (MSG) Celery salt   Onion salt Chili sauce   Prepared mustard Garlic salt   Salt, seasoned salt, sea salt Gravy mixes   Soy sauce Horseradish   Steak sauce Ketchup   Tartar sauce Lite salt    Teriyaki sauce Marinade mixes   Worcestershire sauce  Others Baking powder   Cocoa and cocoa mixes Baking soda   Commercial casserole mixes Candy-caramels, chocolate  Dehydrated soups    Bars, fudge,nougats  Instant rice and pasta mixes Canned broth or soup  Maraschino cherries Cheese, aged and processed cheese and cheese spreads  Learning Assessment Quiz  Indicated T (for True) or F (for False) for each of the following statements:  1. _____ Fresh fruits and vegetables and unprocessed grains are generally low in sodium 2. _____ Water may contain a considerable amount of sodium, depending on the source 3. _____ You can always tell if a food is high in sodium by tasting it 4. _____ Certain laxatives my be high in sodium and should be avoided unless prescribed   by a physician or pharmacist 5. _____ Salt substitutes may be used freely by anyone on a sodium restricted diet 6. _____ Sodium is present in table salt, food additives and as a natural component of   most foods 7. _____ Table salt is approximately 90% sodium  8. _____ Limiting sodium intake may help prevent excess fluid  accumulation in the body 9. _____ On a sodium-restricted diet, seasonings such as bouillon soy sauce, and    cooking wine should be used in place of table salt 10. _____ On an ingredient list, a product which lists monosodium glutamate as the first   ingredient is an appropriate food to include on a low sodium diet  Circle the best answer(s) to the following statements (Hint: there may be more than one correct answer)  11. On a low-sodium diet, some acceptable snack items are:    A. Olives  F. Bean dip   K. Grapefruit juice    B. Salted Pretzels G. Commercial Popcorn   L. Canned peaches    C. Carrot Sticks  H. Bouillon   M. Unsalted nuts   D. [place] fries  I. Peanut butter crackers N. Salami   E. Sweet pickles J. Tomato Juice   O. Pizza  12.  Seasonings that may be used freely on a reduced - sodium diet include   A. Lemon wedges F.Monosodium glutamate K. Celery seed    B.Soysauce   G. Pepper   L. Mustard powder   C. Sea salt  H. Cooking wine  M. Onion flakes   D. Vinegar  E. Prepared horseradish N. Salsa   E. Sage   J. Worcestershire sauce  O. [place]ut[place]

## 2017-11-01 ENCOUNTER — Ambulatory Visit (HOSPITAL_COMMUNITY): Payer: Medicare Other | Admitting: Hematology

## 2017-11-11 ENCOUNTER — Ambulatory Visit (HOSPITAL_COMMUNITY): Payer: Medicare Other | Admitting: Internal Medicine

## 2017-11-20 ENCOUNTER — Ambulatory Visit (HOSPITAL_COMMUNITY): Admission: RE | Admit: 2017-11-20 | Payer: Medicare Other | Source: Ambulatory Visit

## 2017-11-22 ENCOUNTER — Ambulatory Visit (HOSPITAL_COMMUNITY): Payer: Medicare Other | Admitting: Internal Medicine

## 2018-01-13 ENCOUNTER — Encounter: Payer: Self-pay | Admitting: *Deleted

## 2018-01-13 ENCOUNTER — Telehealth: Payer: Self-pay | Admitting: Cardiology

## 2018-01-13 NOTE — Telephone Encounter (Signed)
Patient called stating that his heart rate continues to stay high.  He states that he went to [facility] twice on Sunday, January 12, 2018  Please call cell [phone].

## 2018-01-13 NOTE — Telephone Encounter (Signed)
Discussed issue with patient below.  Stated he was SOB and could not get his heart rate down even after being at [facility] x 2 yesterday.  Suggested he could be evaluated at [facility] as that is where our doctors are.  Stated that he preferred not to go there as he had a bad experience there the last time he went.  Patient does have appointment scheduled with Dr. [name] for 01/28/2018, but does not feel that he can wait that long.    I have also suggested that we try to get him an appointment with the [facility] clinic.  They only have 2:00 today or 1:30 on Friday, 01/17/2018.    Patient made aware and will take the Friday, 01/17/18 at 1:30 as he could not work out transportation for today.    I will request notes from [place] fax to [facility] clinic when available.    Patient informed to return to ED if symptoms worsen.  He verbalized understanding.

## 2018-01-17 ENCOUNTER — Ambulatory Visit (HOSPITAL_COMMUNITY)
Admission: RE | Admit: 2018-01-17 | Discharge: 2018-01-17 | Disposition: A | Discharge status: Home / Self Care | Payer: Medicare Other | Source: Ambulatory Visit | Attending: Nurse Practitioner | Admitting: Nurse Practitioner

## 2018-01-17 VITALS — BP 110/74 | HR 107 | Ht 73.0 in | Wt 265.0 lb

## 2018-01-17 DIAGNOSIS — I4892 Unspecified atrial flutter: Secondary | ICD-10-CM | POA: Diagnosis present

## 2018-01-17 DIAGNOSIS — I484 Atypical atrial flutter: Secondary | ICD-10-CM

## 2018-01-17 DIAGNOSIS — I482 Chronic atrial fibrillation, unspecified: Secondary | ICD-10-CM | POA: Diagnosis not present

## 2018-01-20 NOTE — Progress Notes (Signed)
Primary Care Physician: [name], NP Referring Physician:  [facility] f/u   Chase Herrera is a 67 y.o. male with a h/o afib/ flutter, s/p aflutter ablation 2013 by dr. [name]. He is here for f/u hospitalization at [facility] 01/06/18. He has a long h/o afib /flutter and continues tostruggle with same. He has had multiple hospitalizations for this. He is chronically ill and deconditioned. He has significant lung issues and difficulty with SOB. He is in a w/c and on O2 today.   He was on Tikosyn until fall of 2018 and Dr. [name] stopped at that time for the drug not keeping him in rhythm. He is in atrial flutter at 107 bpm.  He was last seen December 2018 by Dr. [name] and he stated that pt did not have any good options to keep him in rhythm. He has also been seen by Dr. Willis Modena at [facility] and was no thought to be a candidate for convergent procedure. He states that he is no longer smoking or drinking alcohol.  Today, he denies symptoms of palpitations, chest pain, shortness of breath, orthopnea, PND, lower extremity edema, dizziness, presyncope, syncope, or neurologic sequela. The patient is tolerating medications without difficulties and is otherwise without complaint today.   Past Medical History:  Diagnosis Date  . Alcohol abuse    Quit 08/23/11  . Arthritis   . Atrial fibrillation ([place])   . Atrial flutter ([place])    CTI ablation by Dr [name] 10/2011  . Bacteremia due to Gram-positive bacteria 11/29/2016  . COPD (chronic obstructive pulmonary disease) ([place])   . Depression   . Dysrhythmia    AFib  . Essential hypertension   . History of cardiomyopathy    LVEF 25-30% 08/2011 with subsequent normalization  . History of kidney stones   . Obesity   . Prostate enlargement   . Small cell lung cancer ([place])    Right upper lobe - follows with [facility]  . Type 2 diabetes mellitus ([place])    Past Surgical History:  Procedure Laterality Date  . APPENDECTOMY    . ATRIAL  ABLATION SURGERY  11/06/2011   CTI ablation for atrial flutter by Dr [name]  . ATRIAL FLUTTER ABLATION N/A 11/06/2011   Procedure: ATRIAL FLUTTER ABLATION;  Surgeon: Thompson Grayer, MD;  Location: [facility] CATH LAB;  Service: Cardiovascular;  Laterality: N/A;  . Cataracts    . COLONOSCOPY WITH PROPOFOL N/A 06/06/2017   Procedure: COLONOSCOPY WITH PROPOFOL;  Surgeon: [name], MD;  Location[facility] ENDO SUITE;  Service: Endoscopy;  Laterality: N/A;  1:15pm  . IR GENERIC HISTORICAL  04/09/2016   IR US GUIDE VASC ACCESS RIGHT 04/09/2016 [name], DO [facility]-INTERV RAD  . IR GENERIC HISTORICAL  04/09/2016   IR FLUORO GUIDE PORT INSERTION RIGHT 04/09/2016 [name], DO [facility]  . PORT-A-CATH REMOVAL N/A 04/24/2017   Procedure: MINOR REMOVAL PORT-A-CATH;  Surgeon: [name], MD;  Location: [facility] ORS;  Service: General;  Laterality: N/A;  . TONSILLECTOMY      Current Outpatient Medications  Medication Sig Dispense Refill  . acetaminophen (TYLENOL) 325 MG tablet Take 650 mg by mouth every 6 (six) hours as needed for moderate pain or headache.     Marland Kitchen AQUALANCE LANCETS 30G MISC USE TO check blood glucose twice daily  99  . atorvastatin (LIPITOR) 20 MG tablet Take 1 tablet (20 mg total) by mouth daily. 90 tablet 3  . budesonide-formoterol (SYMBICORT) 160-4.5 MCG/ACT inhaler Inhale  2 puffs into the lungs 2 (two) times daily.     . Carboxymethylcellul-Glycerin (LUBRICATING EYE DROPS OP) Place 2 drops into both eyes daily as needed (dry eyes).    . cetirizine (ZYRTEC) 10 MG tablet Take 1 tablet (10 mg total) by mouth daily. 30 tablet 11  . diltiazem (DILACOR XR) 240 MG 24 hr capsule Take 480 mg by mouth daily.    . fluticasone (FLONASE) 50 MCG/ACT nasal spray Place 2 sprays into both nostrils daily. (Patient taking differently: Place 2 sprays into both nostrils daily as needed for allergies. ) 16 g 6  . furosemide (LASIX) 40 MG tablet TAKE 1 TABLET BY MOUTH EVERY DAY 90 tablet 0  . gabapentin  (NEURONTIN) 400 MG capsule Take 1 capsule (400 mg total) by mouth 3 (three) times daily. (Patient taking differently: Take 400 mg by mouth 4 (four) times daily. ) 180 capsule 3  . insulin lispro (HUMALOG) 100 UNIT/ML injection Inject into the skin 3 (three) times daily before meals. Sliding scale    . ipratropium-albuterol (DUONEB) 0.5-2.5 (3) MG/3ML SOLN Take 3 mLs by nebulization 4 (four) times daily. Only take when out of Xopenex    . ketorolac (ACULAR) 0.5 % ophthalmic solution Place 1 drop into the left eye 4 (four) times daily as needed for pain.  0  . LANTUS SOLOSTAR 100 UNIT/ML Solostar Pen INJECT 20 UNITS UNDER THE SKIN EVERY MORNING AND INJECT 20 UNITS UNDER THE SKIN EVERY EVENING (Patient taking differently: Inject 25 units twice daily) 45 mL 0  . levalbuterol (XOPENEX) 0.63 MG/3ML nebulizer solution Inhale 3 mLs (0.63 mg total) into the lungs 4 (four) times daily. 3 mL 0  . metFORMIN (GLUCOPHAGE) 1000 MG tablet Take 1 tablet (1,000 mg total) by mouth 2 (two) times daily with a meal. 180 tablet 3  . metoprolol tartrate (LOPRESSOR) 50 MG tablet Take 150 mg by mouth as directed. Take 50 mg in the morning and 100 mg in the evening    . Multiple Vitamins-Minerals (COMPLETE MULTIVITAMIN/MINERAL PO) TAKE 1 TABLET BY MOUTH DAILY  99  . polyethylene glycol (MIRALAX / GLYCOLAX) packet Take 17 g by mouth daily as needed for severe constipation.    Marland Kitchen PROAIR HFA 108 (90 Base) MCG/ACT inhaler INHALE TWO PUFFS BY MOUTH EVERY 4 HOURS AS NEEDED FOR WHEEZING 17 g 0  . rivaroxaban (XARELTO) 20 MG TABS tablet Take 1 tablet (20 mg total) by mouth daily. 30 tablet 0  . tamsulosin (FLOMAX) 0.4 MG CAPS capsule Take 1 capsule (0.4 mg total) by mouth 2 (two) times daily after a meal. 180 capsule 1  . tiotropium (SPIRIVA HANDIHALER) 18 MCG inhalation capsule Place 1 capsule (18 mcg total) into inhaler and inhale daily. 30 capsule 12  . traZODone (DESYREL) 50 MG tablet Take 50 mg by mouth at bedtime.  5  .  guaiFENesin (MUCINEX) 600 MG 12 hr tablet Take 1,200 mg by mouth daily as needed for cough.     Marland Kitchen ROXICODONE 5 MG immediate release tablet Take 1 tablet (5 mg total) by mouth every 4 (four) hours as needed for severe pain or breakthrough pain. (Patient not taking: Reported on 01/17/2018) 5 tablet 0   No current facility-administered medications for this encounter.     No Known Allergies  Social History   Socioeconomic History  . Marital status: Single    Spouse name: Not on file  . Number of children: Not on file  . Years of education: Not on file  .  Highest education level: Not on file  Occupational History  . Occupation: retired     Comment: Network engineer  . Financial resource strain: Somewhat hard  . Food insecurity:    Worry: Patient refused    Inability: Patient refused  . Transportation needs:    Medical: Patient refused    Non-medical: Patient refused  Tobacco Use  . Smoking status: Former Smoker    Packs/day: 0.50    Years: 47.00    Pack years: 23.50    Types: Cigarettes    Last attempt to quit: 05/30/2012    Years since quitting: 5.6  . Smokeless tobacco: Former Systems developer    Quit date: 04/10/2014  . Tobacco comment: encouraged to quit today 03/04/12  Substance and Sexual Activity  . Alcohol use: No    Frequency: Never    Comment: quit 3 1/2 years ago  . Drug use: Not Currently    Comment: marajuana occasionally  . Sexual activity: Not Currently  Lifestyle  . Physical activity:    Days per week: Patient refused    Minutes per session: Patient refused  . Stress: To some extent  Relationships  . Social connections:    Talks on phone: Patient refused    Gets together: Patient refused    Attends religious service: Patient refused    Active member of club or organization: Patient refused    Attends meetings of clubs or organizations: Patient refused    Relationship status: Patient refused  . Intimate partner violence:    Fear of current or ex  partner: Patient refused    Emotionally abused: Patient refused    Physically abused: Patient refused    Forced sexual activity: Patient refused  Other Topics Concern  . Not on file  Social History Narrative   Lives in [place] alone.   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 Diabetes Sister   . Hypertension Mother   . Lung cancer Mother   . Hypertension Father   . Heart attack Father   . Hypertension Sister   . Hypertension Sister   . Hypertension Sister   . Pulmonary fibrosis Sister   . Breast cancer Cousin   . Colon cancer Neg Hx     ROS- All systems are reviewed and negative except as per the HPI above  Physical Exam: Vitals:   01/17/18 1345  BP: 110/74  Pulse: (!) 107  Weight: 120.2 kg  Height: 6\' 1"  (1.854 m)   Wt Readings from Last 3 Encounters:  01/17/18 120.2 kg  10/28/17 114.8 kg  07/10/17 117 kg    Labs: Lab Results  Component Value Date   NA 138 10/16/2017   K 3.7 10/16/2017   CL 102 10/16/2017   CO2 25 10/16/2017   GLUCOSE 233 (H) 10/16/2017   BUN 29 (H) 10/16/2017   CREATININE 1.24 10/16/2017   CALCIUM 8.5 (L) 10/16/2017   MG 1.8 12/02/2016   Lab Results  Component Value Date   INR 1.02 04/09/2016   Lab Results  Component Value Date   CHOL 100 06/03/2017   HDL 40 (L) 06/03/2017   LDLCALC 41 06/03/2017   TRIG 103 06/03/2017     GEN- The patient is well appearing, alert and oriented x 3 today.   Head- normocephalic, atraumatic Eyes-  Sclera clear, conjunctiva pink Ears- hearing intact Oropharynx- clear Neck- supple, no JVP Lymph-  no cervical lymphadenopathy Lungs- Clear to ausculation bilaterally, normal work of breathing Heart- irregular rate and rhythm, no murmurs, rubs or gallops, PMI not laterally displaced GI- soft, NT, ND, + BS Extremities- no clubbing, cyanosis, or edema MS- no significant deformity or atrophy Skin- no rash or  lesion Psych- euthymic mood, full affect Neuro- strength and sensation are intact  EKG- atrial flutter 107 bpm, qrs int 80 ms, qtc 520 ms Epic records reviewed    Assessment and Plan: 1. Chronic Aflutter/afib with RVR Per Dr. Jackalyn Lombard note 12/28/16-  "Very difficult situation.  Recently failed medical therapy with tikosyn.  Will not take amiodarone due to concerns of interaction.  He has significant comorbidites and also atrial enlargement. He is very symptomatic. He says that Dr Caryl Comes had suggested AV nodal ablation and PPM.  Given his h/o bacteremia of unknown source, very poor dentition, and risks of recurrent bacteremia, I would NOT advise AV nodal ablation.  He is unfortunately a poor candidate for ablation. [facility]hi at [facility] pt and did not think he was a convergent candidate.  Very complicated patient with medicine refractory atrial fibrillation and complex medical history." Unfortunately, I do not have anything else to offer today He is reasonably rate controlled for being a sedentary person and this will be his ongoing treatment strategy.  Continue current doses of BB/CCB Continue xarelto for a chadvasc score of at least 4  F/u with Dr. [name] 01/28/2018  Geroge Baseman. Aahil Fredin, [facility] [address] [place], [place] [address] [phone]

## 2018-01-27 NOTE — Progress Notes (Signed)
Cardiology Office Note  Date: 01/28/2018   ID: Chase, Herrera 1950-10-28, MRN [id number]  PCP: [name], NP  Primary Cardiologist: [name], MD   Chief Complaint  Patient presents with  . Atrial fibrillation/flutter    History of Present Illness: Chase Herrera is a 68 y.o. male most recently seen in late December in the atrial fibrillation clinic.  I reviewed interval records since my last encounter with him, he is not felt to have viable options for maintaining sinus rhythm nor was he felt to be a good candidate for AV node ablation. Plan as of his last EP encounter is for rate control strategy.  He presents today for follow-up.  Heart rate is around 100-110 in atrial flutter.  He is on Lopressor currently at 50 mg in the morning and 100 mg in the evening in addition to Dilacor XR, systolic blood pressure 527 today.  He reports compliance with Xarelto and no obvious bleeding problems.  I talked with him today about continuing with plan for heart rate control, realizing that rates in the 100-110 range are to be expected with his present rhythm and that we will have limitations about further up titration of medications due to blood pressure.  He remains chronically short of breath with COPD, intermittent use of oxygen and also nebulizer treatments.  Past Medical History:  Diagnosis Date  . Alcohol abuse    Quit 08/23/11  . Arthritis   . Atrial fibrillation ([place])   . Atrial flutter ([place])    CTI ablation by Dr [name] 10/2011  . Bacteremia due to Gram-positive bacteria 11/29/2016  . COPD (chronic obstructive pulmonary disease) ([place])   . Depression   . Essential hypertension   . History of cardiomyopathy    LVEF 25-30% 08/2011 with subsequent normalization  . History of kidney stones   . Obesity   . Prostate enlargement   . Small cell lung cancer ([place])    Right upper lobe - follows with [facility]  . Type 2 diabetes mellitus ([place])     Past Surgical  History:  Procedure Laterality Date  . APPENDECTOMY    . ATRIAL ABLATION SURGERY  11/06/2011   CTI ablation for atrial flutter by Dr [name]  . ATRIAL FLUTTER ABLATION N/A 11/06/2011   Procedure: ATRIAL FLUTTER ABLATION;  Surgeon: Thompson Grayer, MD;  Location[facility] CATH LAB;  Service: Cardiovascular;  Laterality: N/A;  . Cataracts    . COLONOSCOPY WITH PROPOFOL N/A 06/06/2017   Procedure: COLONOSCOPY WITH PROPOFOL;  Surgeon: [name], MD;  [facility] ENDO SUITE;  Service: Endoscopy;  Laterality: N/A;  1:15pm  . IR GENERIC HISTORICAL  04/09/2016   IR US GUIDE VASC ACCESS RIGHT 04/09/2016 [name], DO [facility]INTER[facility] RAD  . IR GENERIC HISTORICAL  04/09/2016   IR FLUORO GUIDE PORT INSERTION RIGHT 04/09/2016 [name], DO [facility]  . PORT-A-CATH REMOVAL N/A 04/24/2017   Procedure: MINOR REMOVAL PORT-A-CATH;  Surgeon: [name], MD;  Location: [facility] ORS;  Service: General;  Laterality: N/A;  . TONSILLECTOMY      Current Outpatient Medications  Medication Sig Dispense Refill  . AQUALANCE LANCETS 30G MISC USE TO check blood glucose twice daily  99  . atorvastatin (LIPITOR) 20 MG tablet Take 1 tablet (20 mg total) by mouth daily. 90 tablet 3  . budesonide-formoterol (SYMBICORT) 160-4.5 MCG/ACT inhaler Inhale 2 puffs into the lungs 2 (two) times daily.     . Carboxymethylcellul-Glycerin (LUBRICATING EYE DROPS  OP) Place 2 drops into both eyes daily as needed (dry eyes).    . cetirizine (ZYRTEC) 10 MG tablet Take 1 tablet (10 mg total) by mouth daily. 30 tablet 11  . diltiazem (DILACOR XR) 240 MG 24 hr capsule Take 480 mg by mouth daily.    . fluticasone (FLONASE) 50 MCG/ACT nasal spray Place 2 sprays into both nostrils daily. (Patient taking differently: Place 2 sprays into both nostrils daily as needed for allergies. ) 16 g 6  . furosemide (LASIX) 40 MG tablet TAKE 1 TABLET BY MOUTH EVERY DAY 90 tablet 0  . gabapentin (NEURONTIN) 400 MG capsule Take 1 capsule (400 mg total) by mouth 3  (three) times daily. (Patient taking differently: Take 400 mg by mouth 4 (four) times daily. ) 180 capsule 3  . guaiFENesin (MUCINEX) 600 MG 12 hr tablet Take 1,200 mg by mouth daily as needed for cough.     . insulin lispro (HUMALOG) 100 UNIT/ML injection Inject into the skin 3 (three) times daily before meals. Sliding scale    . ipratropium-albuterol (DUONEB) 0.5-2.5 (3) MG/3ML SOLN Take 3 mLs by nebulization 4 (four) times daily. Only take when out of Xopenex    . ketorolac (ACULAR) 0.5 % ophthalmic solution Place 1 drop into the left eye 4 (four) times daily as needed for pain.  0  . LANTUS SOLOSTAR 100 UNIT/ML Solostar Pen INJECT 20 UNITS UNDER THE SKIN EVERY MORNING AND INJECT 20 UNITS UNDER THE SKIN EVERY EVENING (Patient taking differently: Inject 25 units twice daily) 45 mL 0  . levalbuterol (XOPENEX) 0.63 MG/3ML nebulizer solution Inhale 3 mLs (0.63 mg total) into the lungs 4 (four) times daily. 3 mL 0  . metFORMIN (GLUCOPHAGE) 1000 MG tablet Take 1 tablet (1,000 mg total) by mouth 2 (two) times daily with a meal. 180 tablet 3  . metoprolol tartrate (LOPRESSOR) 50 MG tablet Take 150 mg by mouth as directed. Take 50 mg in the morning and 100 mg in the evening    . Multiple Vitamins-Minerals (COMPLETE MULTIVITAMIN/MINERAL PO) TAKE 1 TABLET BY MOUTH DAILY  99  . polyethylene glycol (MIRALAX / GLYCOLAX) packet Take 17 g by mouth daily as needed for severe constipation.    Marland Kitchen PROAIR HFA 108 (90 Base) MCG/ACT inhaler INHALE TWO PUFFS BY MOUTH EVERY 4 HOURS AS NEEDED FOR WHEEZING 17 g 0  . rivaroxaban (XARELTO) 20 MG TABS tablet Take 1 tablet (20 mg total) by mouth daily. 30 tablet 0  . tamsulosin (FLOMAX) 0.4 MG CAPS capsule Take 1 capsule (0.4 mg total) by mouth 2 (two) times daily after a meal. 180 capsule 1  . tiotropium (SPIRIVA HANDIHALER) 18 MCG inhalation capsule Place 1 capsule (18 mcg total) into inhaler and inhale daily. 30 capsule 12  . traZODone (DESYREL) 50 MG tablet Take 50 mg by  mouth at bedtime.  5   No current facility-administered medications for this visit.    Allergies:  Patient has no known allergies.   Social History: The patient  reports that he quit smoking about 5 years ago. His smoking use included cigarettes. He has a 23.50 pack-year smoking history. He quit smokeless tobacco use about 3 years ago. He reports previous drug use. He reports that he does not drink alcohol.   ROS:  Please see the history of present illness. Otherwise, complete review of systems is positive for chronic shortness of breath.  All other systems are reviewed and negative.   Physical Exam: VS:  BP 102/62  Pulse (!) 107   Ht 6\' 1"  (1.854 m)   Wt 266 lb (120.7 kg)   SpO2 96%   BMI 35.09 kg/m , BMI Body mass index is 35.09 kg/m.  Wt Readings from Last 3 Encounters:  01/28/18 266 lb (120.7 kg)  01/17/18 265 lb (120.2 kg)  10/28/17 253 lb (114.8 kg)    General: Chronically ill-appearing male, no distress. HEENT: Conjunctiva and lids normal, oropharynx clear. Neck: Supple, no elevated JVP or carotid bruits, no thyromegaly. Lungs: Decreased breath sounds without wheezing, nonlabored breathing at rest. Cardiac: Irregular and distant, no S3 or significant systolic murmur. Abdomen: Soft, nontender, bowel sounds present. Extremities: Stable appearing lower leg edema, distal pulses 1-2+. Skin: Warm and dry. Musculoskeletal: No kyphosis. Neuropsychiatric: Alert and oriented x3, affect grossly appropriate.  ECG: I personally reviewed the tracing from 01/17/2018 which showed atrial flutter with variable conduction and poor R wave progression.  Recent Labwork: 10/16/2017: ALT 44; AST 26; BUN 29; Creatinine, Ser 1.24; Hemoglobin 12.1; Platelets 164; Potassium 3.7; Sodium 138     Component Value Date/Time   CHOL 100 06/03/2017 1124   TRIG 103 06/03/2017 1124   HDL 40 (L) 06/03/2017 1124   CHOLHDL 2.5 06/03/2017 1124   LDLCALC 41 06/03/2017 1124    Other Studies Reviewed  Today:  Echocardiogram 09/27/2017 [facility]): LVEF 55 to 60%, normal left atrial chamber size, normal right ventricular contraction, no pericardial effusion.  Assessment and Plan:  1.  Permanent atrial fibrillation/flutter.  Plan is for heart rate control strategy and anticoagulation with Xarelto.  Continue present doses of Lopressor and Dilacor XR.  Lab work from September reviewed above.  Follow-up CBC and BMET with PCP.  2.  Severe oxygen dependent COPD with chronic hypoxic respiratory failure.  3.  History of nonischemic cardiomyopathy with LVEF 55 to 60% by echocardiogram in September 2019.  4.  Small cell lung cancer, continues to follow with oncology.  Current medicines were reviewed with the patient today.  Disposition: Follow-up in 6 months.  Signed, Satira Sark, MD, [facility] 01/28/2018 11:08 AM    [facility] [place], [place], [place] [address] Phone: [phone]; Fax: [phone]

## 2018-01-28 ENCOUNTER — Ambulatory Visit (INDEPENDENT_AMBULATORY_CARE_PROVIDER_SITE_OTHER): Payer: Medicare Other | Admitting: Cardiology

## 2018-01-28 ENCOUNTER — Encounter: Payer: Self-pay | Admitting: Cardiology

## 2018-01-28 VITALS — BP 102/62 | HR 107 | Ht 73.0 in | Wt 266.0 lb

## 2018-01-28 DIAGNOSIS — I4891 Unspecified atrial fibrillation: Secondary | ICD-10-CM

## 2018-01-28 DIAGNOSIS — I428 Other cardiomyopathies: Secondary | ICD-10-CM | POA: Diagnosis not present

## 2018-01-28 DIAGNOSIS — C3490 Malignant neoplasm of unspecified part of unspecified bronchus or lung: Secondary | ICD-10-CM | POA: Diagnosis not present

## 2018-01-28 DIAGNOSIS — J449 Chronic obstructive pulmonary disease, unspecified: Secondary | ICD-10-CM

## 2018-01-28 DIAGNOSIS — I4892 Unspecified atrial flutter: Secondary | ICD-10-CM

## 2018-01-28 NOTE — Patient Instructions (Signed)

## 2018-02-17 IMAGING — US IR FLUORO GUIDE CV LINE*R*
1 series · 1 of 1 positions shown · non-contrast
Comparison: none

INDICATION: 65-year-old male with a history of small cell carcinoma

[Series 1: ir fluoro/shunt/fist · 1 of 1 slices shown]
[im 1/1]
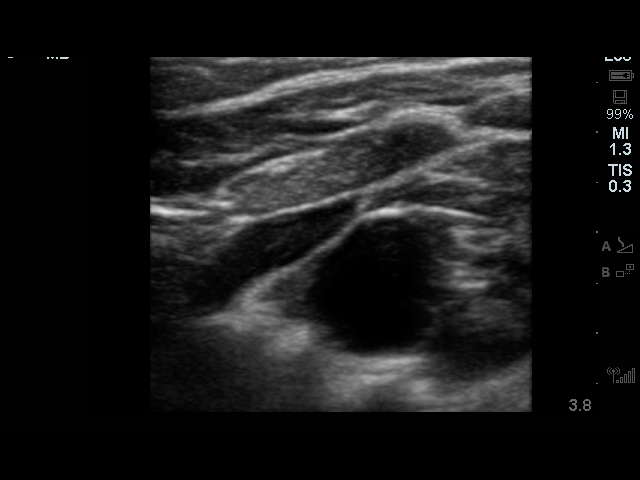

[1 of 1 positions shown; findings below may reference images not displayed]

EXAM:
PORT CATHETER PLACEMENT

MEDICATIONS:
2.0 g Ancef; The antibiotic was administered within an appropriate
time interval prior to skin puncture.

ANESTHESIA/SEDATION:
Moderate (conscious) sedation was employed during this procedure. A
total of Versed 1.5 mg and Fentanyl 50 mcg was administered
intravenously.

Moderate Sedation Time: 28 minutes. The patient's level of
consciousness and vital signs were monitored continuously by
radiology nursing throughout the procedure under my direct
supervision.

FLUOROSCOPY TIME:  Fluoroscopy Time: 0 minutes 6 seconds (3.6 mGy).

COMPLICATIONS:
None

PROCEDURE:
The procedure, risks, benefits, and alternatives were explained to
the patient. Questions regarding the procedure were encouraged and
answered. The patient understands and consents to the procedure.

Ultrasound survey was performed with images stored and sent to PACs.

The right neck and chest was prepped with chlorhexidine, and draped
in the usual sterile fashion using maximum barrier technique (cap
and mask, sterile gown, sterile gloves, large sterile sheet, hand
hygiene and cutaneous antiseptic). Antibiotic prophylaxis was
provided with 2.0g Ancef administered IV one hour prior to skin
incision. Local anesthesia was attained by infiltration with 1%
lidocaine without epinephrine.

Ultrasound demonstrated patency of the right internal jugular vein,
and this was documented with an image. Under real-time ultrasound
guidance, this vein was accessed with a 21 gauge micropuncture
needle and image documentation was performed. A small dermatotomy
was made at the access site with an 11 scalpel. A 0.018" wire was
advanced into the SVC and used to estimate the length of the
internal catheter. The access needle exchanged for a 4F
micropuncture vascular sheath. The 0.018" wire was then removed and
a 0.035" wire advanced into the IVC.



The venous access site was then serially dilated and a peel away
vascular sheath placed over the wire. The wire was removed and the
port catheter advanced into position under fluoroscopic guidance.
The catheter tip is positioned in the cavoatrial junction. This was
documented with a spot image. The portacatheter was then tested and
found to flush and aspirate well. The port was flushed with saline
followed by 100 units/mL heparinized saline.

The pocket was then closed in two layers using first subdermal
inverted interrupted absorbable sutures followed by a running
subcuticular suture. The epidermis was then sealed with Dermabond.
The dermatotomy at the venous access site was also seal with
Dermabond.

Patient tolerated the procedure well and remained hemodynamically
stable throughout.

No complications encountered and no significant blood loss
encountered
IMPRESSION: Status post right IJ port catheter placement. Catheter ready for
use.

## 2018-02-19 ENCOUNTER — Encounter: Payer: Self-pay | Admitting: *Deleted

## 2018-02-19 NOTE — Progress Notes (Signed)
Cardiology Office Note  Date: 02/20/2018   ID: Chase Herrera, Chase Herrera 12-31-1950, MRN [id number]  PCP: [name], NP  Primary Cardiologist: [name], MD   Chief Complaint  Patient presents with  . Cardiac follow-up    History of Present Illness: Chase Herrera is a 68 y.o. male seen recently in early January.  He is referred back to the office reporting that he has had recent ER visits at [facility] due to uncontrolled heart rates.  He states that it was "over 200."  On one occasion this occurred on a day when he had not taken his medications regularly, but he states that this has happened on other days when he was compliant.  As noted previously based on extensive EP evaluation, he is not felt to have viable options for maintaining sinus rhythm nor be a good candidate for AV node ablation.  We went over his medications in detail.  I reinforced compliance, we discussed adding a dose of Lopressor in the middle of the day beginning at 25 mg and increasing to 50 mg if tolerated.  Past Medical History:  Diagnosis Date  . Alcohol abuse    Quit 08/23/11  . Arthritis   . Atrial fibrillation ([place])   . Atrial flutter ([place])    CTI ablation by Dr [name] 10/2011  . Bacteremia due to Gram-positive bacteria 11/29/2016  . COPD (chronic obstructive pulmonary disease) ([place])   . Depression   . Essential hypertension   . History of cardiomyopathy    LVEF 25-30% 08/2011 with subsequent normalization  . History of kidney stones   . Obesity   . Prostate enlargement   . Small cell lung cancer ([place])    Right upper lobe - follows with [facility]  . Type 2 diabetes mellitus ([place])     Past Surgical History:  Procedure Laterality Date  . APPENDECTOMY    . ATRIAL ABLATION SURGERY  11/06/2011   CTI ablation for atrial flutter by Dr [name]  . ATRIAL FLUTTER ABLATION N/A 11/06/2011   Procedure: ATRIAL FLUTTER ABLATION;  Surgeon: Thompson Grayer, MD;  Location: [facility] CATH LAB;   Service: Cardiovascular;  Laterality: N/A;  . Cataracts    . COLONOSCOPY WITH PROPOFOL N/A 06/06/2017   Procedure: COLONOSCOPY WITH PROPOFOL;  Surgeon: [name], MD;  Location: [facility] ENDO SUITE;  Service: Endoscopy;  Laterality: N/A;  1:15pm  . IR GENERIC HISTORICAL  04/09/2016   IR US GUIDE VASC ACCESS RIGHT 04/09/2016 [name], DO [facility]-INTERV RAD  . IR GENERIC HISTORICAL  04/09/2016   IR FLUORO GUIDE PORT INSERTION RIGHT 04/09/2016 [name], DO [facility]-INTERV RAD  . PORT-A-CATH REMOVAL N/A 04/24/2017   Procedure: MINOR REMOVAL PORT-A-CATH;  Surgeon: [name], MD;  Location: [facility] ORS;  Service: General;  Laterality: N/A;  . TONSILLECTOMY      Current Outpatient Medications  Medication Sig Dispense Refill  . AQUALANCE LANCETS 30G MISC USE TO check blood glucose twice daily  99  . atorvastatin (LIPITOR) 20 MG tablet Take 1 tablet (20 mg total) by mouth daily. 90 tablet 3  . budesonide-formoterol (SYMBICORT) 160-4.5 MCG/ACT inhaler Inhale 2 puffs into the lungs 2 (two) times daily.     . Carboxymethylcellul-Glycerin (LUBRICATING EYE DROPS OP) Place 2 drops into both eyes daily as needed (dry eyes).    . cetirizine (ZYRTEC) 10 MG tablet Take 1 tablet (10 mg total) by mouth daily. 30 tablet 11  . diltiazem (DILACOR XR) 240  MG 24 hr capsule Take 480 mg by mouth daily.    . fluticasone (FLONASE) 50 MCG/ACT nasal spray Place 2 sprays into both nostrils daily. (Patient taking differently: Place 2 sprays into both nostrils daily as needed for allergies. ) 16 g 6  . furosemide (LASIX) 40 MG tablet TAKE 1 TABLET BY MOUTH EVERY DAY 90 tablet 0  . gabapentin (NEURONTIN) 400 MG capsule Take 1 capsule (400 mg total) by mouth 3 (three) times daily. (Patient taking differently: Take 400 mg by mouth 4 (four) times daily. ) 180 capsule 3  . guaiFENesin (MUCINEX) 600 MG 12 hr tablet Take 1,200 mg by mouth daily as needed for cough.     . insulin lispro (HUMALOG) 100 UNIT/ML injection Inject into the skin  3 (three) times daily before meals. Sliding scale    . ipratropium-albuterol (DUONEB) 0.5-2.5 (3) MG/3ML SOLN Take 3 mLs by nebulization 4 (four) times daily. Only take when out of Xopenex    . ketorolac (ACULAR) 0.5 % ophthalmic solution Place 1 drop into the left eye 4 (four) times daily as needed for pain.  0  . LANTUS SOLOSTAR 100 UNIT/ML Solostar Pen INJECT 20 UNITS UNDER THE SKIN EVERY MORNING AND INJECT 20 UNITS UNDER THE SKIN EVERY EVENING (Patient taking differently: Inject 25 units twice daily) 45 mL 0  . levalbuterol (XOPENEX) 0.63 MG/3ML nebulizer solution Inhale 3 mLs (0.63 mg total) into the lungs 4 (four) times daily. 3 mL 0  . metFORMIN (GLUCOPHAGE) 1000 MG tablet Take 1 tablet (1,000 mg total) by mouth 2 (two) times daily with a meal. 180 tablet 3  . metoprolol tartrate (LOPRESSOR) 50 MG tablet Take 150 mg by mouth as directed. Take 50 mg in the morning; 25 mg at noon and 100 mg in the evening. May increase noon dose to 50 mg as needed for heart rate control    . Multiple Vitamins-Minerals (COMPLETE MULTIVITAMIN/MINERAL PO) TAKE 1 TABLET BY MOUTH DAILY  99  . polyethylene glycol (MIRALAX / GLYCOLAX) packet Take 17 g by mouth daily as needed for severe constipation.    Marland Kitchen PROAIR HFA 108 (90 Base) MCG/ACT inhaler INHALE TWO PUFFS BY MOUTH EVERY 4 HOURS AS NEEDED FOR WHEEZING 17 g 0  . rivaroxaban (XARELTO) 20 MG TABS tablet Take 1 tablet (20 mg total) by mouth daily. 30 tablet 0  . tamsulosin (FLOMAX) 0.4 MG CAPS capsule Take 1 capsule (0.4 mg total) by mouth 2 (two) times daily after a meal. 180 capsule 1  . tiotropium (SPIRIVA HANDIHALER) 18 MCG inhalation capsule Place 1 capsule (18 mcg total) into inhaler and inhale daily. 30 capsule 12  . traZODone (DESYREL) 50 MG tablet Take 50 mg by mouth at bedtime.  5   No current facility-administered medications for this visit.    Allergies:  Patient has no known allergies.   Social History: The patient  reports that he quit smoking about  5 years ago. His smoking use included cigarettes. He has a 23.50 pack-year smoking history. He quit smokeless tobacco use about 3 years ago. He reports previous drug use. He reports that he does not drink alcohol.   ROS:  Please see the history of present illness. Otherwise, complete review of systems is positive for chronic shortness of breath.  All other systems are reviewed and negative.   Physical Exam: VS:  BP 123/70   Pulse (!) 109   Ht 6' (1.829 m)   Wt 272 lb 9.6 oz (123.7 kg)   SpO2  90%   BMI 36.97 kg/m , BMI Body mass index is 36.97 kg/m.  Wt Readings from Last 3 Encounters:  02/20/18 272 lb 9.6 oz (123.7 kg)  01/28/18 266 lb (120.7 kg)  01/17/18 265 lb (120.2 kg)    General: Chronically ill-appearing male wearing oxygen via nasal cannula in wheelchair. HEENT: Conjunctiva and lids normal, oropharynx clear. Neck: Supple, no elevated JVP or carotid bruits, no thyromegaly. Lungs: Decreased breath sounds with prolonged expiratory phase. Cardiac: Irregularly irregular, no S3 or significant systolic murmur. Abdomen: Soft, nontender, bowel sounds present. Extremities: Stable appearing lower leg edema, distal pulses 1-2+. Skin: Warm and dry. Musculoskeletal: No kyphosis. Neuropsychiatric: Alert and oriented x3, affect grossly appropriate.  ECG: I personally reviewed the tracing from 01/17/2018 which showed atrial flutter with variable conduction and poor R wave progression.  Recent Labwork: 10/16/2017: ALT 44; AST 26; BUN 29; Creatinine, Ser 1.24; Hemoglobin 12.1; Platelets 164; Potassium 3.7; Sodium 138     Component Value Date/Time   CHOL 100 06/03/2017 1124   TRIG 103 06/03/2017 1124   HDL 40 (L) 06/03/2017 1124   CHOLHDL 2.5 06/03/2017 1124   LDLCALC 41 06/03/2017 1124    Other Studies Reviewed Today:  Echocardiogram 09/27/2017 [facility]): LVEF 55 to 60%, normal left atrial chamber size, normal right ventricular contraction, no pericardial  effusion.  Assessment and Plan:  1.  Permanent atrial fibrillation/flutter.  As detailed above, he does not have viable options for antiarrhythmic therapy/rhythm management or AV node ablation based on previous extensive EP evaluations.  We are left with heart rate control strategy which has been challenging.  Resting heart rate between 100-110 has been tolerated on combination of high-dose calcium channel blocker and beta-blocker.  We will try to increase beta-blocker further by adding a 25 mg Lopressor dose in the middle the day and increase this to 50 mg if tolerated.  Otherwise continue on Xarelto for stroke prophylaxis.  2.  Severe oxygen dependent COPD with chronic hypoxic respiratory failure.  3.  History of nonischemic cardiomyopathy with normalization of LVEF, most recently 55 to 60%.  4.  Small cell lung cancer.  Current medicines were reviewed with the patient today.  Disposition: Follow-up in 6 months.  Signed, Satira Sark, MD, [facility] 02/20/2018 3:27 PM    [facility] [place], [place], [place] [address] Phone: [phone]; Fax: [phone]

## 2018-02-20 ENCOUNTER — Ambulatory Visit (INDEPENDENT_AMBULATORY_CARE_PROVIDER_SITE_OTHER): Payer: Medicare Other | Admitting: Cardiology

## 2018-02-20 ENCOUNTER — Encounter: Payer: Self-pay | Admitting: Cardiology

## 2018-02-20 VITALS — BP 123/70 | HR 109 | Ht 72.0 in | Wt 272.6 lb

## 2018-02-20 DIAGNOSIS — J449 Chronic obstructive pulmonary disease, unspecified: Secondary | ICD-10-CM

## 2018-02-20 DIAGNOSIS — C3490 Malignant neoplasm of unspecified part of unspecified bronchus or lung: Secondary | ICD-10-CM

## 2018-02-20 DIAGNOSIS — I4891 Unspecified atrial fibrillation: Secondary | ICD-10-CM

## 2018-02-20 DIAGNOSIS — I4892 Unspecified atrial flutter: Secondary | ICD-10-CM

## 2018-02-20 DIAGNOSIS — Z8679 Personal history of other diseases of the circulatory system: Secondary | ICD-10-CM | POA: Diagnosis not present

## 2018-02-20 NOTE — Patient Instructions (Addendum)
Medication Instructions:   Your physician has recommended you make the following change in your medication:   Add 25 mg metoprolol to middle of the day. May increase to 50 mg middle of day for heart rate control as needed.  Continue all other medications the same  Labwork:  NONE  Testing/Procedures:  NONE  Follow-Up:  Your physician recommends that you schedule a follow-up appointment in: 6 months. You will receive a reminder letter in the mail in about 4 months reminding you to call and schedule your appointment. If you don't receive this letter, please contact our office.  Any Other Special Instructions Will Be Listed Below (If Applicable).  If you need a refill on your cardiac medications before your next appointment, please call your pharmacy.

## 2018-02-21 ENCOUNTER — Other Ambulatory Visit: Payer: Self-pay | Admitting: *Deleted

## 2018-02-21 ENCOUNTER — Telehealth: Payer: Self-pay | Admitting: *Deleted

## 2018-02-21 MED ORDER — METOPROLOL TARTRATE 50 MG PO TABS: 50.0000 mg | ORAL_TABLET | Freq: Two times a day (BID) | ORAL | 2 refills | Status: DC

## 2018-02-21 NOTE — Telephone Encounter (Signed)
Home health nurse called to report that patient went to the ED last night and again this morning. Patient did not start the 25 mg at noon today because he wasn't sure if he could break his 50 mg in half. Home health nurse informed that patient was informed on yesterday that he could break the 50 mg tablet in half for his noon dose and verbalized understanding. Advised home health nurse that a new rx was sent to his pharmacy today for the dose increase.

## 2018-02-28 ENCOUNTER — Telehealth: Payer: Self-pay | Admitting: *Deleted

## 2018-02-28 ENCOUNTER — Encounter: Payer: Self-pay | Admitting: *Deleted

## 2018-02-28 NOTE — Telephone Encounter (Signed)
I am surprised that his heart rate would actually get that fast on current medical therapy.  Could you please obtain actual monitor strips or ECGs demonstrating this.

## 2018-02-28 NOTE — Telephone Encounter (Signed)
Patient contacted office stating that he had to go back to the ED at 4:00 am this morning for elevated HR and is requesting an appointment to be seen. Patient said that his HR was in the 200's. Patient said that he has been taking lopressor 50 mg BID and 25 mg at noon. Patient advised that per his last office visit, that he could increase the noon dose to 50 mg as needed for elevated heart rate. Patient give first available appt 03/13/18 with Ahmed Prima and advised that if his symptoms get worse, to go back to the ED for an evaluation. Verbalized understanding of plan. Records requested from [facility].

## 2018-03-06 ENCOUNTER — Telehealth: Payer: Self-pay | Admitting: Cardiology

## 2018-03-06 ENCOUNTER — Other Ambulatory Visit: Payer: Self-pay | Admitting: *Deleted

## 2018-03-06 MED ORDER — METOPROLOL TARTRATE 50 MG PO TABS: 50.0000 mg | ORAL_TABLET | Freq: Three times a day (TID) | ORAL | 1 refills | Status: DC

## 2018-03-06 NOTE — Telephone Encounter (Signed)
Home health says pt BP is 120/68 HR 107-108 - pt denies any symptoms at this time - has been taking Lopressor 50 mg bid and taking 25 mg at lunchtime - went to ED the last 2 mornings as was given antibiotics and breathing treatment for bronchitis - pt already has f/u 2/20 with Bernerd Pho, PA

## 2018-03-06 NOTE — Telephone Encounter (Signed)
Chase Herrera ( [place]) [phone] is at patient's home . States that  he had to go back to the ED at 2:00am on 03/16/18 and 5:00am op 03/13/18  am for elevated HR . Home Health Nurse states that he is having shortness of breath.

## 2018-03-06 NOTE — Telephone Encounter (Signed)
Pt voiced understanding - does not need a new rx at this time - updated medication list

## 2018-03-06 NOTE — Telephone Encounter (Signed)
Noted.  I saw his recent ECG from ER visit as well.  Very difficult to manage his arrhythmia, particularly with EP evaluation not providing any feasible treatment options.  Would try and increase Lopressor to 50 mg at lunchtime dose.

## 2018-03-13 ENCOUNTER — Ambulatory Visit: Payer: Medicare Other | Admitting: Student

## 2018-03-15 DIAGNOSIS — I509 Heart failure, unspecified: Secondary | ICD-10-CM | POA: Insufficient documentation

## 2018-09-10 ENCOUNTER — Telehealth: Payer: Self-pay | Admitting: Cardiology

## 2018-09-10 NOTE — Telephone Encounter (Signed)
Virtual Visit Pre-Appointment Phone Call  "(Name), I am calling you today to discuss your upcoming appointment. We are currently trying to limit exposure to the virus that causes COVID-19 by seeing patients at home rather than in the office."  1. "What is the BEST phone number to call the day of the visit?" - include this in appointment notes  2. Do you have or have access to (through a family member/friend) a smartphone with video capability that we can use for your visit?" a. If yes - list this number in appt notes as cell (if different from BEST phone #) and list the appointment type as a VIDEO visit in appointment notes b. If no - list the appointment type as a PHONE visit in appointment notes  3. Confirm consent - "In the setting of the current Covid19 crisis, you are scheduled for a (phone or video) visit with your provider on (date) at (time).  Just as we do with many in-office visits, in order for you to participate in this visit, we must obtain consent.  If you'd like, I can send this to your mychart (if signed up) or email for you to review.  Otherwise, I can obtain your verbal consent now.  All virtual visits are billed to your insurance company just like a normal visit would be.  By agreeing to a virtual visit, we'd like you to understand that the technology does not allow for your provider to perform an examination, and thus may limit your provider's ability to fully assess your condition. If your provider identifies any concerns that need to be evaluated in person, we will make arrangements to do so.  Finally, though the technology is pretty good, we cannot assure that it will always work on either your or our end, and in the setting of a video visit, we may have to convert it to a phone-only visit.  In either situation, we cannot ensure that we have a secure connection.  Are you willing to proceed?" STAFF: Did the patient verbally acknowledge consent to telehealth visit? Document  YES/NO here: yes  4. Advise patient to be prepared - "Two hours prior to your appointment, go ahead and check your blood pressure, pulse, oxygen saturation, and your weight (if you have the equipment to check those) and write them all down. When your visit starts, your provider will ask you for this information. If you have an Apple Watch or Kardia device, please plan to have heart rate information ready on the day of your appointment. Please have a pen and paper handy nearby the day of the visit as well."  5. Give patient instructions for MyChart download to smartphone OR Doximity/[email or web address] as below if video visit (depending on what platform provider is using)  6. Inform patient they will receive a phone call 15 minutes prior to their appointment time (may be from unknown caller ID) so they should be prepared to answer    TELEPHONE CALL NOTE  Chase Herrera has been deemed a candidate for a follow-up tele-health visit to limit community exposure during the Covid-19 pandemic. I spoke with the patient via phone to ensure availability of phone/video source, confirm preferred email & phone number, and discuss instructions and expectations.  I reminded Chase Herrera to be prepared with any vital sign and/or heart rhythm information that could potentially be obtained via home monitoring, at the time of his visit. I reminded Chase Herrera to expect a phone call prior to  his visit.  Chase Herrera 09/10/2018 4:20 PM   INSTRUCTIONS FOR DOWNLOADING THE MYCHART APP TO SMARTPHONE  - The patient must first make sure to have activated MyChart and know their login information - If Apple, go to [employer] and type in MyChart in the search bar and download the app. If Android, ask patient to go to [employer] and type in [place] in the search bar and download the app. The app is free but as with any other app downloads, their phone may require them to verify saved payment information or Apple/Android  password.  - The patient will need to then log into the app with their MyChart username and password, and select [facility] as their healthcare provider to link the account. When it is time for your visit, go to the MyChart app, find appointments, and click Begin Video Visit. Be sure to Select Allow for your device to access the Microphone and Camera for your visit. You will then be connected, and your provider will be with you shortly.  **If they have any issues connecting, or need assistance please contact MyChart service desk [phone])**  **If using a computer, in order to ensure the best quality for their visit they will need to use either of the following Internet Browsers: [employer], or Google Chrome**  IF USING DOXIMITY or DOXY.ME - The patient will receive a link just prior to their visit by text.     FULL LENGTH CONSENT FOR TELE-HEALTH VISIT   I hereby voluntarily request, consent and authorize [facility] and its employed or contracted physicians, physician assistants, nurse practitioners or other licensed health care professionals (the Practitioner), to provide me with telemedicine health care services (the Services") as deemed necessary by the treating Practitioner. I acknowledge and consent to receive the Services by the Practitioner via telemedicine. I understand that the telemedicine visit will involve communicating with the Practitioner through live audiovisual communication technology and the disclosure of certain medical information by electronic transmission. I acknowledge that I have been given the opportunity to request an in-person assessment or other available alternative prior to the telemedicine visit and am voluntarily participating in the telemedicine visit.  I understand that I have the right to withhold or withdraw my consent to the use of telemedicine in the course of my care at any time, without affecting my right to future care or treatment,  and that the Practitioner or I may terminate the telemedicine visit at any time. I understand that I have the right to inspect all information obtained and/or recorded in the course of the telemedicine visit and may receive copies of available information for a reasonable fee.  I understand that some of the potential risks of receiving the Services via telemedicine include:   Delay or interruption in medical evaluation due to technological equipment failure or disruption;  Information transmitted may not be sufficient (e.g. poor resolution of images) to allow for appropriate medical decision making by the Practitioner; and/or   In rare instances, security protocols could fail, causing a breach of personal health information.  Furthermore, I acknowledge that it is my responsibility to provide information about my medical history, conditions and care that is complete and accurate to the best of my ability. I acknowledge that Practitioner's advice, recommendations, and/or decision may be based on factors not within their control, such as incomplete or inaccurate data provided by me or distortions of diagnostic images or specimens that may result from electronic transmissions. I  understand that the practice of medicine is not an exact science and that Practitioner makes no warranties or guarantees regarding treatment outcomes. I acknowledge that I will receive a copy of this consent concurrently upon execution via email to the email address I last provided but may also request a printed copy by calling the office of [place].    I understand that my insurance will be billed for this visit.   I have read or had this consent read to me.  I understand the contents of this consent, which adequately explains the benefits and risks of the Services being provided via telemedicine.   I have been provided ample opportunity to ask questions regarding this consent and the Services and have had my questions  answered to my satisfaction.  I give my informed consent for the services to be provided through the use of telemedicine in my medical care  By participating in this telemedicine visit I agree to the above.

## 2018-09-14 NOTE — Progress Notes (Signed)
Virtual Visit via Telephone Note   This visit type was conducted due to national recommendations for restrictions regarding the COVID-19 Pandemic (e.g. social distancing) in an effort to limit this patient's exposure and mitigate transmission in our community.  Due to his co-morbid illnesses, this patient is at least at moderate risk for complications without adequate follow up.  This format is felt to be most appropriate for this patient at this time.  The patient did not have access to video technology/had technical difficulties with video requiring transitioning to audio format only (telephone).  All issues noted in this document were discussed and addressed.  No physical exam could be performed with this format.  Please refer to the patient's chart for his  consent to telehealth for [facility].   Date:  09/15/2018   ID:  Chase Herrera, Chase Herrera [birth year], MRN [id number]  Patient Location: Home Provider Location: Office  PCP:  [name], NP  Cardiologist:  [name], MD Electrophysiologist:  None   Evaluation Performed:  Follow-Up Visit  Chief Complaint:   Cardiac follow-up  History of Present Illness:    Chase Herrera is a 68 y.o. male last seen in January.  I reviewed extensive records.  Patient was hospitalized in transfer to [facility] back in February with symptomatic, rapid atrial flutter.  He was continued on combination of diltiazem CD and Lopressor, initiated on Tikosyn with plan eventually for an outpatient ablation which had already been discussed with Dr. Willis Modena has an outpatient.  This was put on hold subsequently due to the coronavirus pandemic.  He did not have video access and we spoke by phone today.  He tells me that he has been doing reasonably well without recent rapid palpitations.  He was seen in the ER a few weeks ago by report related to respiratory symptoms and discharged.  Records being requested.  He reports compliance with his current  medications which now include Lopressor, Cardizem CD, Tikosyn at 250 mcg every 12 hours, and Xarelto.  The patient does not have symptoms concerning for COVID-19 infection (fever, chills, cough, or new shortness of breath).    Past Medical History:  Diagnosis Date  . Alcohol abuse    Quit 08/23/11  . Arthritis   . Atrial fibrillation ([place])   . Atrial flutter ([place])    CTI ablation by Dr [name] 10/2011  . Bacteremia due to Gram-positive bacteria 11/29/2016  . COPD (chronic obstructive pulmonary disease) ([place])   . Depression   . Essential hypertension   . History of cardiomyopathy    LVEF 25-30% 08/2011 with subsequent normalization  . History of kidney stones   . Obesity   . Prostate enlargement   . Small cell lung cancer ([place])    Right upper lobe - follows with [facility]  . Type 2 diabetes mellitus ([place])    Past Surgical History:  Procedure Laterality Date  . APPENDECTOMY    . ATRIAL ABLATION SURGERY  11/06/2011   CTI ablation for atrial flutter by Dr [name]  . ATRIAL FLUTTER ABLATION N/A 11/06/2011   Procedure: ATRIAL FLUTTER ABLATION;  Surgeon: Thompson Grayer, MD;  Location: [facility] CATH LAB;  Service: Cardiovascular;  Laterality: N/A;  . Cataracts    . COLONOSCOPY WITH PROPOFOL N/A 06/06/2017   Procedure: COLONOSCOPY WITH PROPOFOL;  Surgeon: [name], MD;  Location: [facility] ENDO SUITE;  Service: Endoscopy;  Laterality: N/A;  1:15pm  . IR GENERIC HISTORICAL  04/09/2016   IR US  GUIDE VASC ACCESS RIGHT 04/09/2016 [name], DO [facility]-INTERV RAD  . IR GENERIC HISTORICAL  04/09/2016   IR FLUORO GUIDE PORT INSERTION RIGHT 04/09/2016 [name], DO [facility]-INTER[facility] RAD  . PORT-A-CATH REMOVAL N/A 04/24/2017   Procedure: MINOR REMOVAL PORT-A-CATH;  Surgeon: [name], MD;  Location: [facility] ORS;  Service: General;  Laterality: N/A;  . TONSILLECTOMY       Current Meds  Medication Sig  . AQUALANCE LANCETS 30G MISC USE TO check blood glucose twice daily  . atorvastatin (LIPITOR) 20 MG  tablet Take 1 tablet (20 mg total) by mouth daily.  . budesonide-formoterol (SYMBICORT) 160-4.5 MCG/ACT inhaler Inhale 2 puffs into the lungs 2 (two) times daily.   . cetirizine (ZYRTEC) 10 MG tablet Take 1 tablet (10 mg total) by mouth daily.  Marland Kitchen diltiazem (DILACOR XR) 240 MG 24 hr capsule Take 240 mg by mouth daily.  . fluticasone (FLONASE) 50 MCG/ACT nasal spray Place 2 sprays into both nostrils daily. (Patient taking differently: Place 2 sprays into both nostrils daily as needed for allergies. )  . furosemide (LASIX) 40 MG tablet TAKE 1 TABLET BY MOUTH EVERY DAY  . gabapentin (NEURONTIN) 300 MG capsule Take 600 mg by mouth 4 (four) times daily.  Marland Kitchen guaiFENesin (MUCINEX) 600 MG 12 hr tablet Take 1,200 mg by mouth daily as needed for cough.   . insulin lispro (HUMALOG) 100 UNIT/ML injection Inject into the skin 3 (three) times daily before meals. Sliding scale  . ipratropium-albuterol (DUONEB) 0.5-2.5 (3) MG/3ML SOLN Take 3 mLs by nebulization 4 (four) times daily. Only take when out of Xopenex  . LANTUS SOLOSTAR 100 UNIT/ML Solostar Pen INJECT 20 UNITS UNDER THE SKIN EVERY MORNING AND INJECT 20 UNITS UNDER THE SKIN EVERY EVENING (Patient taking differently: Inject 18 Units into the skin 2 (two) times daily. )  . levalbuterol (XOPENEX) 0.63 MG/3ML nebulizer solution Inhale 3 mLs (0.63 mg total) into the lungs 4 (four) times daily.  . metFORMIN (GLUCOPHAGE) 1000 MG tablet Take 1 tablet (1,000 mg total) by mouth 2 (two) times daily with a meal.  . metoprolol tartrate (LOPRESSOR) 50 MG tablet Take 50 mg by mouth 2 (two) times daily.  . Multiple Vitamins-Minerals (COMPLETE MULTIVITAMIN/MINERAL PO) TAKE 1 TABLET BY MOUTH DAILY  . polyethylene glycol (MIRALAX / GLYCOLAX) packet Take 17 g by mouth daily as needed for severe constipation.  Marland Kitchen PROAIR HFA 108 (90 Base) MCG/ACT inhaler INHALE TWO PUFFS BY MOUTH EVERY 4 HOURS AS NEEDED FOR WHEEZING  . rivaroxaban (XARELTO) 20 MG TABS tablet Take 1 tablet (20 mg  total) by mouth daily.  . tamsulosin (FLOMAX) 0.4 MG CAPS capsule Take 1 capsule (0.4 mg total) by mouth 2 (two) times daily after a meal.  . tiotropium (SPIRIVA HANDIHALER) 18 MCG inhalation capsule Place 1 capsule (18 mcg total) into inhaler and inhale daily.  . traZODone (DESYREL) 50 MG tablet Take 50 mg by mouth at bedtime.     Allergies:   Patient has no known allergies.   Social History   Tobacco Use  . Smoking status: Former Smoker    Packs/day: 0.50    Years: 47.00    Pack years: 23.50    Types: Cigarettes    Quit date: 05/30/2012    Years since quitting: 6.2  . Smokeless tobacco: Former Systems developer    Quit date: 04/10/2014  . Tobacco comment: encouraged to quit today 03/04/12  Substance Use Topics  . Alcohol use: No    Frequency: Never  Comment: quit 3 1/2 years ago  . Drug use: Not Currently    Comment: marajuana occasionally     Family Hx: The patient's family history includes Breast cancer in his cousin; Diabetes in his sister; Heart attack in his father; Hypertension in his father, mother, sister, sister, and sister; Lung cancer in his mother; Pulmonary fibrosis in his sister. There is no history of Colon cancer.  ROS:   Please see the history of present illness. All other systems reviewed and are negative.   Prior CV studies:   The following studies were reviewed today:  [facility] LVEF 55 to 60%, normal left atrial chamber size, normal right ventricular contraction, no pericardial effusion.  Labs/Other Tests and Data Reviewed:    EKG:  An ECG dated 02/28/2018 was personally reviewed today and demonstrated:  Supraventricular tachycardia with leftward axis and repolarization abnormalities.  Recent Labs: 10/16/2017: ALT 44; BUN 29; Creatinine, Ser 1.24; Hemoglobin 12.1; Platelets 164; Potassium 3.7; Sodium 138   Recent Lipid Panel Lab Results  Component Value Date/Time   CHOL 100 06/03/2017 11:24 AM   TRIG 103 06/03/2017  11:24 AM   HDL 40 (L) 06/03/2017 11:24 AM   CHOLHDL 2.5 06/03/2017 11:24 AM   LDLCALC 41 06/03/2017 11:24 AM    Wt Readings from Last 3 Encounters:  09/15/18 260 lb (117.9 kg)  02/20/18 272 lb 9.6 oz (123.7 kg)  01/28/18 266 lb (120.7 kg)     Objective:    Vital Signs:  BP (!) 156/79   Pulse 88   Temp (!) 97.3 F (36.3 C)   Ht 6\' 1"  (1.854 m)   Wt 260 lb (117.9 kg)   BMI 34.30 kg/m    Patient spoke in full sentences, not short of breath. No audible wheezing or coughing. Speech pattern normal.  ASSESSMENT & PLAN:    1.  Persistent atrial fibrillation/flutter.  Hospitalization at [facility] noted in February, records reviewed.  He continues on Cardizem CD and metoprolol with Xarelto for stroke prophylaxis.  He was also started on Tikosyn 250 mcg every 12 hours at [facility] and there was discussion for follow-up outpatient ablation with Dr. Willis Modena although this got put on hold during the coronavirus pandemic.  I gave Mr. Fielden the office number for Dr. Willis Modena so that he can make follow-up and further plans.  Requesting recent lab work and ECG from [facility].  2.  Severe oxygen dependent COPD with chronic hypoxic respiratory failure.  3.  History of nonischemic cardiomyopathy with normalization of LVEF, 55 to 60% by echocardiogram last year in September.  4.  History of small cell lung cancer.  COVID-19 Education: The signs and symptoms of COVID-19 were discussed with the patient and how to seek care for testing (follow up with PCP or arrange E-visit).  The importance of social distancing was discussed today.  Time:   Today, I have spent 10 minutes with the patient with telehealth technology discussing the above problems.     Medication Adjustments/Labs and Tests Ordered: Current medicines are reviewed at length with the patient today.  Concerns regarding medicines are outlined above.   Tests Ordered: No orders of the defined types were placed in this  encounter.   Medication Changes: Meds ordered this encounter  Medications  . dofetilide (TIKOSYN) 250 MCG capsule    Sig: Take 1 capsule (250 mcg total) by mouth 2 (two) times daily.    Dispense:       Follow Up:  In Person 4 months in the [place] office.  Signed, [name], MD  09/15/2018 10:48 AM    [place]

## 2018-09-15 ENCOUNTER — Encounter: Payer: Self-pay | Admitting: Cardiology

## 2018-09-15 ENCOUNTER — Encounter: Payer: Self-pay | Admitting: *Deleted

## 2018-09-15 ENCOUNTER — Telehealth (INDEPENDENT_AMBULATORY_CARE_PROVIDER_SITE_OTHER): Payer: Medicare Other | Admitting: Cardiology

## 2018-09-15 VITALS — BP 156/79 | HR 88 | Temp 97.3°F | Ht 73.0 in | Wt 260.0 lb

## 2018-09-15 DIAGNOSIS — Z8679 Personal history of other diseases of the circulatory system: Secondary | ICD-10-CM | POA: Diagnosis not present

## 2018-09-15 DIAGNOSIS — J449 Chronic obstructive pulmonary disease, unspecified: Secondary | ICD-10-CM

## 2018-09-15 DIAGNOSIS — C3490 Malignant neoplasm of unspecified part of unspecified bronchus or lung: Secondary | ICD-10-CM

## 2018-09-15 DIAGNOSIS — I4891 Unspecified atrial fibrillation: Secondary | ICD-10-CM | POA: Diagnosis not present

## 2018-09-15 DIAGNOSIS — I5032 Chronic diastolic (congestive) heart failure: Secondary | ICD-10-CM | POA: Diagnosis not present

## 2018-09-15 DIAGNOSIS — I4892 Unspecified atrial flutter: Secondary | ICD-10-CM

## 2018-09-15 MED ORDER — DOFETILIDE 250 MCG PO CAPS: 250.0000 ug | ORAL_CAPSULE | Freq: Two times a day (BID) | ORAL | Status: AC

## 2018-09-15 NOTE — Patient Instructions (Signed)
Medication Instructions:  Continue all current medications.  Labwork: none  Testing/Procedures: none  Follow-Up: 4 months   Any Other Special Instructions Will Be Listed Below (If Applicable).  If you need a refill on your cardiac medications before your next appointment, please call your pharmacy.\ 

## 2018-09-17 ENCOUNTER — Institutional Professional Consult (permissible substitution): Payer: Medicare Other | Admitting: Pulmonary Disease

## 2018-10-11 IMAGING — CT CT CHEST W/ CM
2 of 4 series · 15 of 36 positions shown, 18 images · IV contrast (iopamidol)
Comparison: PET-CT 08/21/2016, 04/11/2016

CLINICAL DATA: Small cell lung cancer

EXAM:
CT CHEST WITH CONTRAST
TECHNIQUE: Multidetector CT imaging of the chest was performed during
intravenous contrast administration.
CONTRAST:  75mL [id number] IOPAMIDOL ([id number]) INJECTION 61%

[Series 3: chest wo · axial · 0.80mm/px · z∈[+1197,+1501]mm · 12 of 180 slices shown, 15 images]
[im 14/180  mediastinal]
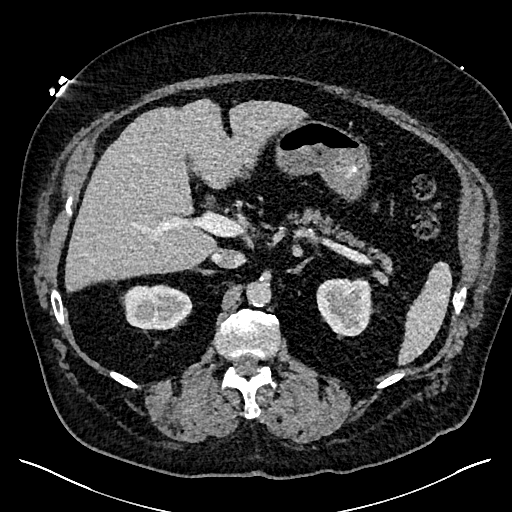
[im 14/180  lung]
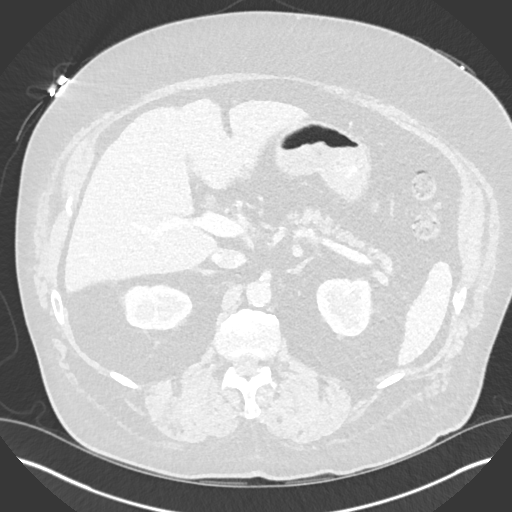
[im 28/180  lung]
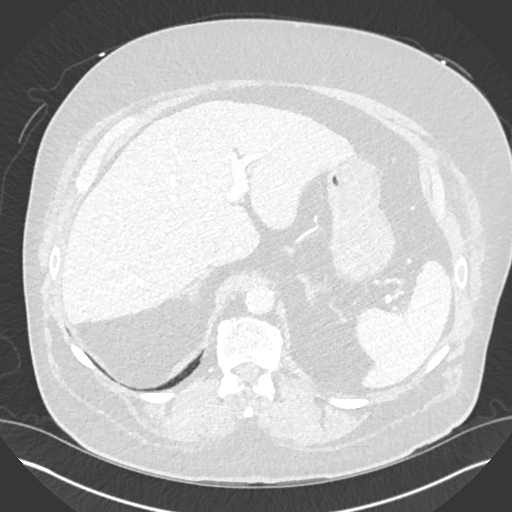
[im 42/180  lung]
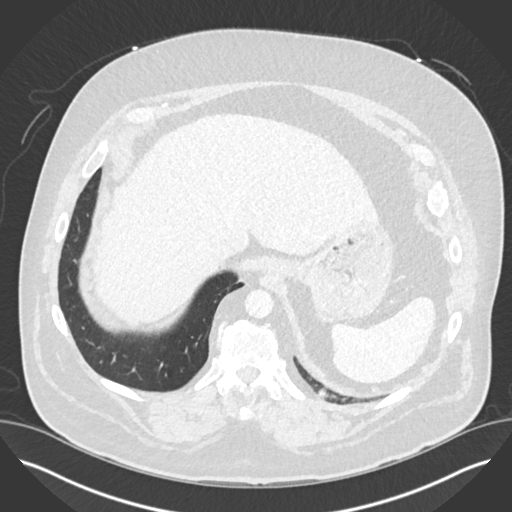
[im 56/180  lung]
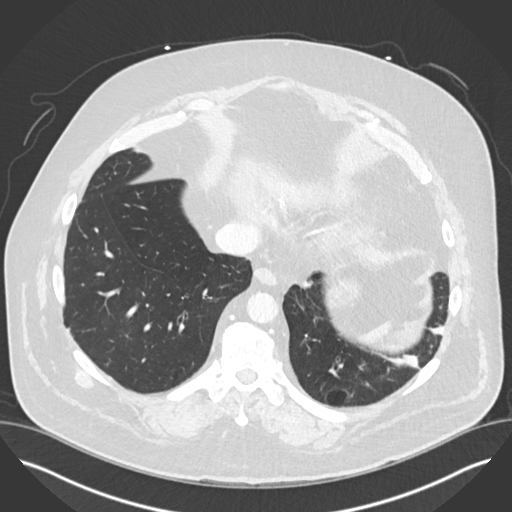
[im 69/180  mediastinal]
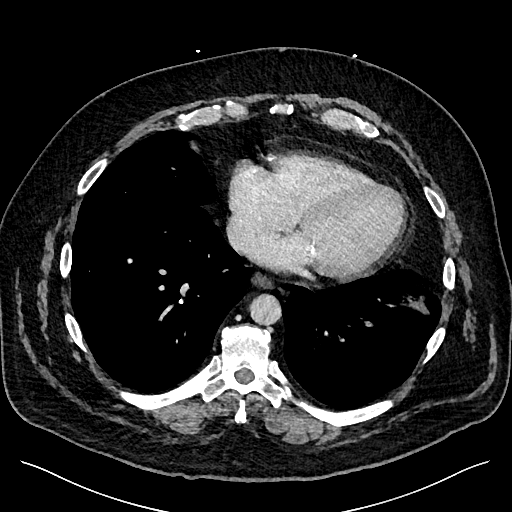
[im 69/180  lung]
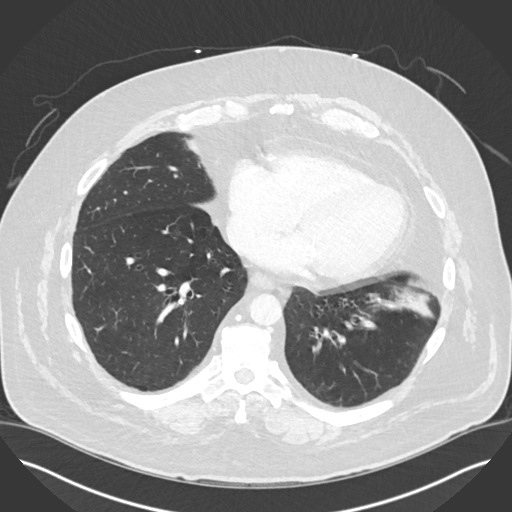
[im 83/180  lung]
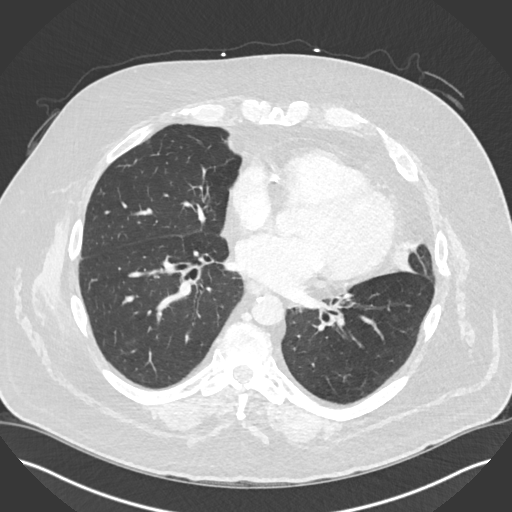
[im 97/180  lung]
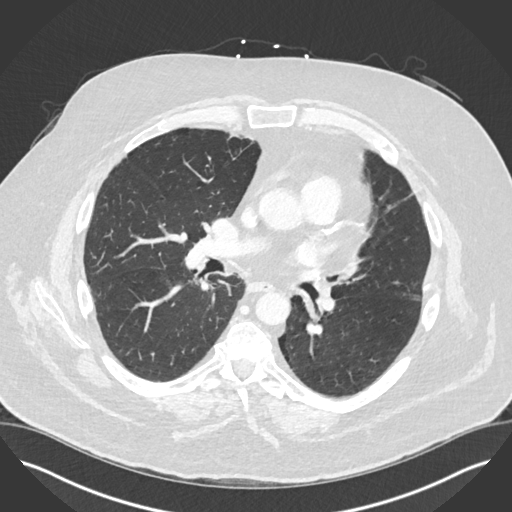
[im 111/180  lung]
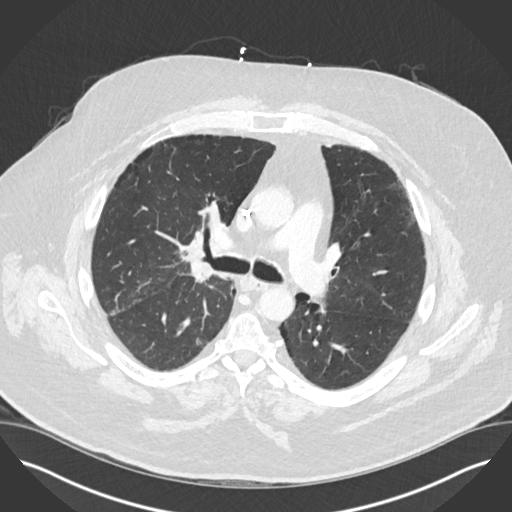
[im 124/180  mediastinal]
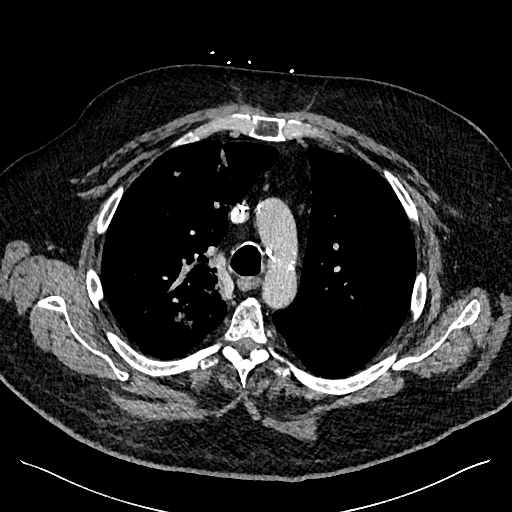
[im 124/180  lung]
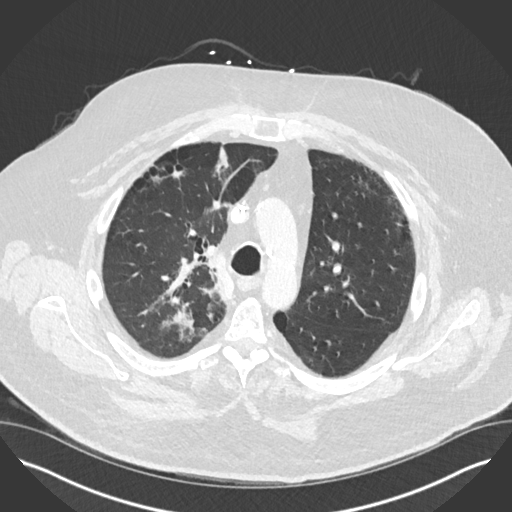
[im 138/180  lung]
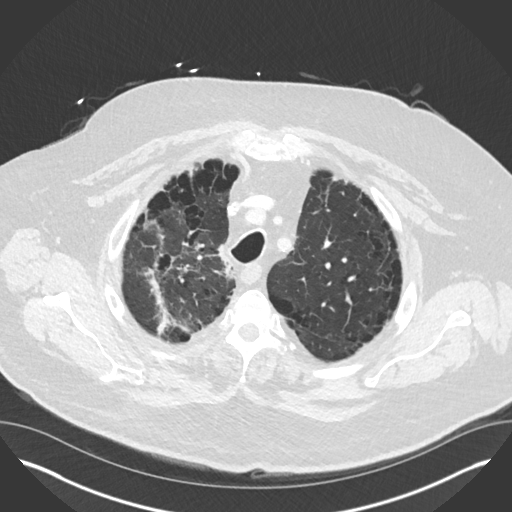
[im 152/180  lung]
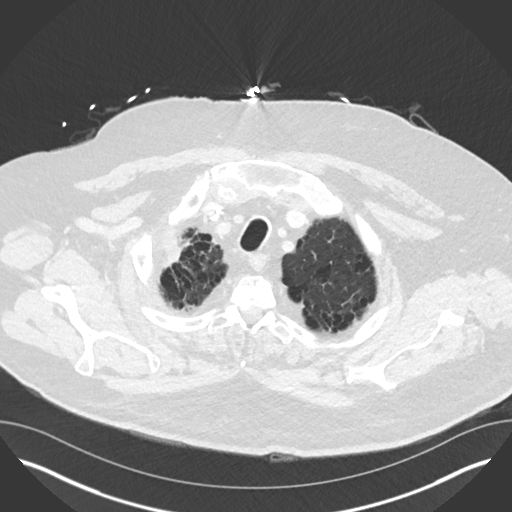
[im 166/180  lung]
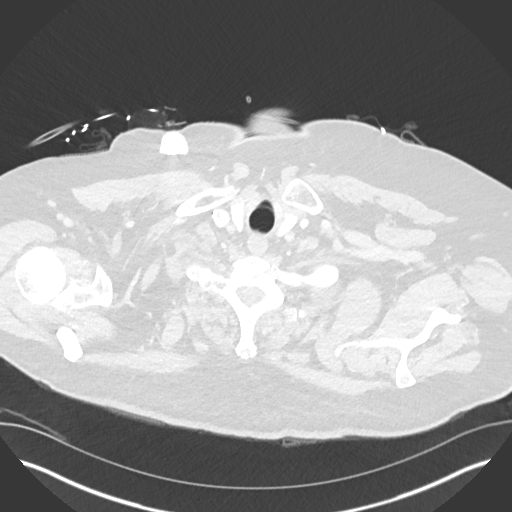

[Series 6: cor · coronal · 0.70mm/px · 3 of 158 slices shown]
[im 32/158  lung]
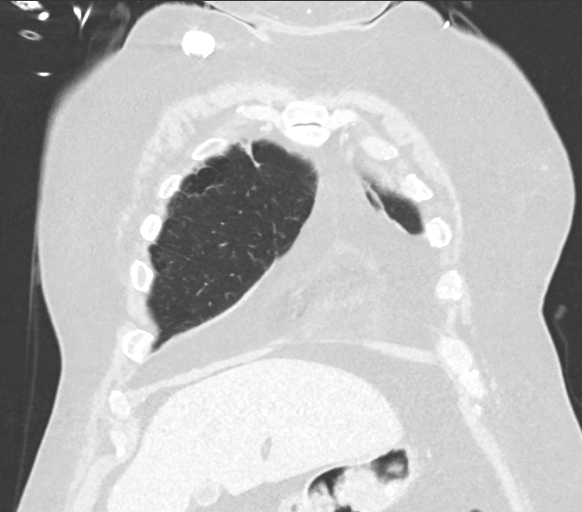
[im 63/158  lung]
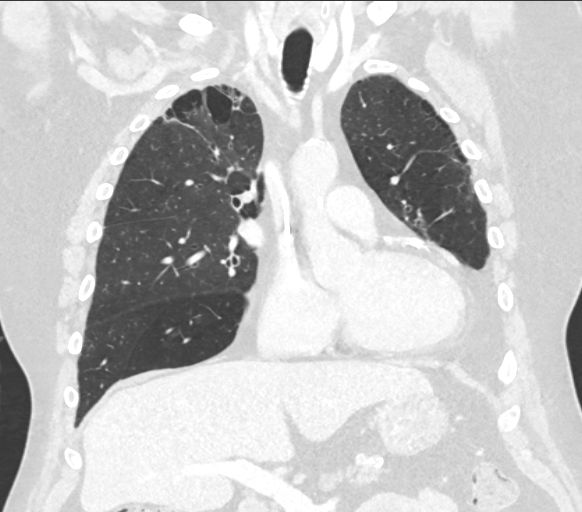
[im 95/158  lung]
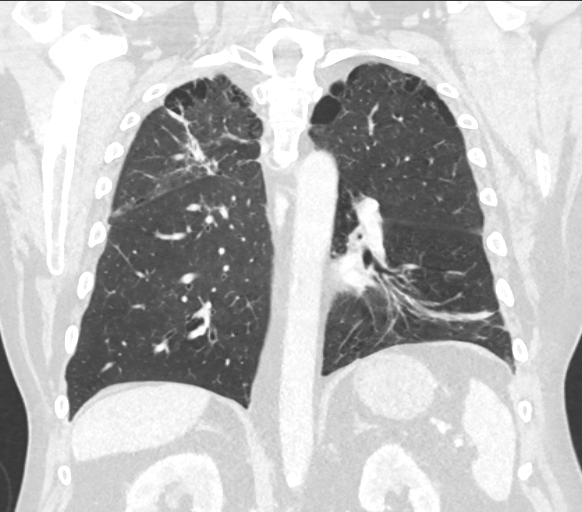

[15 of 36 positions shown; findings below may reference images not displayed]

FINDINGS: Cardiovascular: Coronary artery calcification and aortic
atherosclerotic calcification.

Mediastinum/Nodes: No axillary supraclavicular adenopathy. Port in
the RIGHT chest wall. No mediastinal hilar adenopathy. Esophagus
normal. No pericardial effusion.

Lungs/Pleura: RIGHT upper lobe mass measures 2.8 x 1.6 cm compared
with 3.2 by 1.7 cm on most recent PET-CT scan. This is site of prior
large hypermetabolic mass measuring up to 6.5 cm. Bronchiectasis in
the RIGHT upper lobe. There is new peribronchial nodules and
ground-glass opacity in the RIGHT upper lobe (image 58, series 4) in
an infectious or inflammatory pattern.

Centrilobular emphysema paraseptal emphysema the upper lobes.

Linear atelectasis at the LEFT lung base.

Upper Abdomen: Limited view of the liver, kidneys, pancreas are
unremarkable. Normal adrenal glands.

Musculoskeletal: No aggressive osseous lesion.
IMPRESSION: 1. New peribronchial nodularity and ground-glass opacities in the
RIGHT upper lobe suggest pulmonary infection or inflammation. Lung
cancer recurrence is less favored.
2. Continued contraction of RIGHT upper lobe mass.
3. Paraseptal and centrilobular emphysema.
4. Coronary artery calcification and Aortic Atherosclerosis
([id number]).

## 2018-11-05 ENCOUNTER — Other Ambulatory Visit: Payer: Self-pay

## 2018-11-05 ENCOUNTER — Encounter: Payer: Self-pay | Admitting: Pulmonary Disease

## 2018-11-05 ENCOUNTER — Ambulatory Visit (INDEPENDENT_AMBULATORY_CARE_PROVIDER_SITE_OTHER): Payer: Medicare Other | Admitting: Pulmonary Disease

## 2018-11-05 VITALS — BP 118/82 | HR 89 | Ht 73.0 in | Wt 259.0 lb

## 2018-11-05 DIAGNOSIS — C3490 Malignant neoplasm of unspecified part of unspecified bronchus or lung: Secondary | ICD-10-CM | POA: Diagnosis not present

## 2018-11-05 DIAGNOSIS — Z923 Personal history of irradiation: Secondary | ICD-10-CM

## 2018-11-05 DIAGNOSIS — I5032 Chronic diastolic (congestive) heart failure: Secondary | ICD-10-CM

## 2018-11-05 DIAGNOSIS — J449 Chronic obstructive pulmonary disease, unspecified: Secondary | ICD-10-CM

## 2018-11-05 DIAGNOSIS — J9611 Chronic respiratory failure with hypoxia: Secondary | ICD-10-CM | POA: Diagnosis not present

## 2018-11-05 DIAGNOSIS — Z9221 Personal history of antineoplastic chemotherapy: Secondary | ICD-10-CM

## 2018-11-05 NOTE — Progress Notes (Signed)
Synopsis: Referred in Oct 2020 for COPD by [name], NP  Subjective:   PATIENT ID: Chase Herrera GENDER: male DOB: 1950-11-26, MRN: [id number]  Chief Complaint  Patient presents with  . Pulmonary Consult    68 year old gentleman with a right upper lobe lung mass, T2c, N2, M0, small cell lung carcinoma treated with chemotherapy plus radiation.  Whole brain radiation.  Patient followed at [place].  Recent telemedicine visit with Dr. [name], cardiology.  Atrial fibrillation managed with Lopressor, Cardizem, Tikosyn plus Xarelto.  Patient has severe COPD per documentation with chronic hypoxemic respiratory failure.  Pulmonary function test completed in 2014, FEV1 47% predicted, 1.68 L, DLCO 63%.  OV 11/05/2018: Patient seen today in the office to establish care.  Significant pulmonary history as stated above.  COPD management currently by primary care.  He is on excellent regimen.  Triple therapy at this time plus nebulizer.  Trelegy plus albuterol as needed.  Overall doing very well with this.  He states that he does much better when he remembers to take his medication.  So he is set up a calendar reminder in his phone to make sure he takes his medication as scheduled.  He is dyspneic at baseline.  He has not had PFTs in several years.  Overall doing well in comparison to where he was before.  Recently had hospitalization for various cardiac reasons.  There is question of a planned potential ablation procedure.     Past Medical History:  Diagnosis Date  . Alcohol abuse    Quit 08/23/11  . Arthritis   . Atrial fibrillation ([place])   . Atrial flutter ([place])    CTI ablation by Dr [name] 10/2011  . Bacteremia due to Gram-positive bacteria 11/29/2016  . COPD (chronic obstructive pulmonary disease) ([place])   . Depression   . Essential hypertension   . History of cardiomyopathy    LVEF 25-30% 08/2011 with subsequent normalization  . History of kidney stones   . Obesity   .  Prostate enlargement   . Small cell lung cancer ([place])    Right upper lobe - follows with [facility]  . Type 2 diabetes mellitus (HCC)      Family History  Problem Relation Age of Onset  . Diabetes Sister   . Hypertension Mother   . Lung cancer Mother   . Hypertension Father   . Heart attack Father   . Hypertension Sister   . Hypertension Sister   . Hypertension Sister   . Pulmonary fibrosis Sister   . Breast cancer Cousin   . Colon cancer Neg Hx      Past Surgical History:  Procedure Laterality Date  . APPENDECTOMY    . ATRIAL ABLATION SURGERY  11/06/2011   CTI ablation for atrial flutter by Dr [name]  . ATRIAL FLUTTER ABLATION N/A 11/06/2011   Procedure: ATRIAL FLUTTER ABLATION;  Surgeon: Thompson Grayer, MD;  Location: [facility] CATH LAB;  Service: Cardiovascular;  Laterality: N/A;  . Cataracts    . COLONOSCOPY WITH PROPOFOL N/A 06/06/2017   Procedure: COLONOSCOPY WITH PROPOFOL;  Surgeon: [name], MD;  Location: [facility] ENDO SUITE;  Service: Endoscopy;  Laterality: N/A;  1:15pm  . IR GENERIC HISTORICAL  04/09/2016   IR US GUIDE VASC ACCESS RIGHT 04/09/2016 [name], DO [facility]-INTERV RAD  . IR GENERIC HISTORICAL  04/09/2016   IR FLUORO GUIDE PORT INSERTION RIGHT 04/09/2016 [name], DO [facility]  . PORT-A-CATH REMOVAL N/A 04/24/2017  Procedure: MINOR REMOVAL PORT-A-CATH;  Surgeon: [name], MD;  Location: [facility] ORS;  Service: General;  Laterality: N/A;  . TONSILLECTOMY      Social History   Socioeconomic History  . Marital status: Single    Spouse name: Not on file  . Number of children: Not on file  . Years of education: Not on file  . Highest education level: Not on file  Occupational History  . Occupation: retired     Comment: Network engineer  . Financial resource strain: Somewhat hard  . Food insecurity    Worry: Patient refused    Inability: Patient refused  . Transportation needs    Medical: Patient refused    Non-medical: Patient  refused  Tobacco Use  . Smoking status: Former Smoker    Packs/day: 0.50    Years: 47.00    Pack years: 23.50    Types: Cigarettes    Quit date: 05/30/2012    Years since quitting: 6.4  . Smokeless tobacco: Former Systems developer    Quit date: 04/10/2014  . Tobacco comment: encouraged to quit today 03/04/12  Substance and Sexual Activity  . Alcohol use: No    Frequency: Never    Comment: quit 3 1/2 years ago  . Drug use: Not Currently    Comment: marajuana occasionally  . Sexual activity: Not Currently  Lifestyle  . Physical activity    Days per week: Patient refused    Minutes per session: Patient refused  . Stress: To some extent  Relationships  . Social Herbalist on phone: Patient refused    Gets together: Patient refused    Attends religious service: Patient refused    Active member of club or organization: Patient refused    Attends meetings of clubs or organizations: Patient refused    Relationship status: Patient refused  . Intimate partner violence    Fear of current or ex partner: Patient refused    Emotionally abused: Patient refused    Physically abused: Patient refused    Forced sexual activity: Patient refused  Other Topics Concern  . Not on file  Social History Narrative   Lives in [place] alone.   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 AQUALANCE LANCETS 30G MISC USE TO check blood glucose twice daily  99  . atorvastatin (LIPITOR) 20 MG tablet Take 1 tablet (20 mg total) by mouth daily. 90 tablet 3  . budesonide-formoterol (SYMBICORT) 160-4.5 MCG/ACT inhaler Inhale 2 puffs into the lungs 2 (two) times daily.     . cetirizine (ZYRTEC) 10 MG tablet Take 1 tablet (10 mg total) by mouth daily. 30 tablet 11  . diltiazem (DILACOR XR) 240 MG 24 hr capsule Take 240 mg by mouth daily.    Marland Kitchen dofetilide (TIKOSYN) 250 MCG capsule Take 1  capsule (250 mcg total) by mouth 2 (two) times daily.    . fluticasone (FLONASE) 50 MCG/ACT nasal spray Place 2 sprays into both nostrils daily. (Patient taking differently: Place 2 sprays into both nostrils daily as needed for allergies. ) 16 g 6  . furosemide (LASIX) 40 MG tablet TAKE 1 TABLET BY MOUTH EVERY DAY 90 tablet 0  . gabapentin (NEURONTIN) 300 MG capsule Take 600 mg by mouth 4 (four) times daily.    Marland Kitchen guaiFENesin (MUCINEX) 600 MG 12  hr tablet Take 1,200 mg by mouth daily as needed for cough.     . insulin lispro (HUMALOG) 100 UNIT/ML injection Inject into the skin 3 (three) times daily before meals. Sliding scale    . ipratropium-albuterol (DUONEB) 0.5-2.5 (3) MG/3ML SOLN Take 3 mLs by nebulization 4 (four) times daily. Only take when out of Xopenex    . LANTUS SOLOSTAR 100 UNIT/ML Solostar Pen INJECT 20 UNITS UNDER THE SKIN EVERY MORNING AND INJECT 20 UNITS UNDER THE SKIN EVERY EVENING (Patient taking differently: Inject 18 Units into the skin 2 (two) times daily. ) 45 mL 0  . levalbuterol (XOPENEX) 0.63 MG/3ML nebulizer solution Inhale 3 mLs (0.63 mg total) into the lungs 4 (four) times daily. 3 mL 0  . metFORMIN (GLUCOPHAGE) 1000 MG tablet Take 1 tablet (1,000 mg total) by mouth 2 (two) times daily with a meal. 180 tablet 3  . metoprolol tartrate (LOPRESSOR) 50 MG tablet Take 50 mg by mouth 2 (two) times daily.    . Multiple Vitamins-Minerals (COMPLETE MULTIVITAMIN/MINERAL PO) TAKE 1 TABLET BY MOUTH DAILY  99  . polyethylene glycol (MIRALAX / GLYCOLAX) packet Take 17 g by mouth daily as needed for severe constipation.    Marland Kitchen PROAIR HFA 108 (90 Base) MCG/ACT inhaler INHALE TWO PUFFS BY MOUTH EVERY 4 HOURS AS NEEDED FOR WHEEZING 17 g 0  . rivaroxaban (XARELTO) 20 MG TABS tablet Take 1 tablet (20 mg total) by mouth daily. 30 tablet 0  . tamsulosin (FLOMAX) 0.4 MG CAPS capsule Take 1 capsule (0.4 mg total) by mouth 2 (two) times daily after a meal. 180 capsule 1  . tiotropium (SPIRIVA  HANDIHALER) 18 MCG inhalation capsule Place 1 capsule (18 mcg total) into inhaler and inhale daily. 30 capsule 12  . traZODone (DESYREL) 50 MG tablet Take 50 mg by mouth at bedtime.  5   No facility-administered medications prior to visit.     Review of Systems  Constitutional: Positive for malaise/fatigue. Negative for chills, fever and weight loss.  HENT: Negative for hearing loss, sore throat and tinnitus.   Eyes: Negative for blurred vision and double vision.  Respiratory: Positive for sputum production, shortness of breath and wheezing. Negative for cough, hemoptysis and stridor.   Cardiovascular: Negative for chest pain, palpitations, orthopnea, leg swelling and PND.  Gastrointestinal: Negative for abdominal pain, constipation, diarrhea, heartburn, nausea and vomiting.  Genitourinary: Negative for dysuria, hematuria and urgency.  Musculoskeletal: Negative for joint pain and myalgias.  Skin: Negative for itching and rash.  Neurological: Negative for dizziness, tingling, weakness and headaches.  Endo/Heme/Allergies: Negative for environmental allergies. Does not bruise/bleed easily.  Psychiatric/Behavioral: Negative for depression. The patient is not nervous/anxious and does not have insomnia.   All other systems reviewed and are negative.    Objective:  Physical Exam Vitals signs reviewed.  Constitutional:      General: He is not in acute distress.    Appearance: He is well-developed. He is obese.     Comments: Chronically ill-appearing  HENT:     Head: Normocephalic and atraumatic.     Mouth/Throat:     Comments: Poor dentition Eyes:     General: No scleral icterus.    Conjunctiva/sclera: Conjunctivae normal.     Pupils: Pupils are equal, round, and reactive to light.  Neck:     Musculoskeletal: Neck supple.     Vascular: No JVD.     Trachea: No tracheal deviation.  Cardiovascular:     Rate and Rhythm: Normal rate and  regular rhythm.     Heart sounds: Normal heart  sounds. No murmur.  Pulmonary:     Effort: Pulmonary effort is normal. No tachypnea, accessory muscle usage or respiratory distress.     Breath sounds: Normal breath sounds. No stridor. No wheezing, rhonchi or rales.  Abdominal:     General: Bowel sounds are normal. There is no distension.     Palpations: Abdomen is soft.     Tenderness: There is no abdominal tenderness.  Musculoskeletal:        General: No tenderness.  Lymphadenopathy:     Cervical: No cervical adenopathy.  Skin:    General: Skin is warm and dry.     Capillary Refill: Capillary refill takes less than 2 seconds.     Findings: Bruising present. No rash.     Comments: Bleeding scab of right anterior pre-tibia.  Neurological:     Mental Status: He is alert and oriented to person, place, and time.  Psychiatric:        Behavior: Behavior normal.      Vitals:   11/05/18 1547  BP: 118/82  Pulse: 89  SpO2: 92%  Weight: 259 lb (117.5 kg)  Height: 6\' 1"  (1.854 m)   92% on 2 L pulse BMI Readings from Last 3 Encounters:  11/05/18 34.17 kg/m  09/15/18 34.30 kg/m  02/20/18 36.97 kg/m   Wt Readings from Last 3 Encounters:  11/05/18 259 lb (117.5 kg)  09/15/18 260 lb (117.9 kg)  02/20/18 272 lb 9.6 oz (123.7 kg)     CBC    Component Value Date/Time   WBC 9.0 10/16/2017 0944   RBC 3.75 (L) 10/16/2017 0944   HGB 12.1 (L) 10/16/2017 0944   HCT 38.7 (L) 10/16/2017 0944   PLT 164 10/16/2017 0944   MCV 103.2 (H) 10/16/2017 0944   MCH 32.3 10/16/2017 0944   MCHC 31.3 10/16/2017 0944   RDW 14.5 10/16/2017 0944   LYMPHSABS 0.5 (L) 10/16/2017 0944   MONOABS 1.1 (H) 10/16/2017 0944   EOSABS 0.1 10/16/2017 0944   BASOSABS 0.0 10/16/2017 0944    Chest Imaging: Chest x-ray 02/28/2018: Per report.  This is a scanned copy from [facility]. Right sided para mediastinal fibrosis likely secondary to radiation.  Pulmonary Functions Testing Results: No flowsheet data found.  Previous PFTs with severe COPD 2014.   Scanned into epic.  FeNO: None  Pathology: None   Echocardiogram: None   Heart Catheterization: None    Assessment & Plan:     ICD-10-CM   1. Stage 3 severe COPD by GOLD classification (HCC)  J44.9 Pulmonary Function Test  2. Chronic hypoxemic respiratory failure (HCC)  J96.11   3. Chronic diastolic CHF (congestive heart failure) (HCC)  I50.32   4. Small cell lung cancer (HCC)  C34.90   5. History of chemotherapy  Z92.21   6. History of radiation therapy  Z92.3     Discussion:  68 year old gentleman significant complicated pulmonary history, chronic hypoxemic respiratory failure history of small cell lung cancer in 2017 status post chemotherapy post radiation.  Per patient usually ends up on prednisone 3-4 times per year for an exacerbation.  At this time he has been doing well since recent addition of Trelegy inhaler.  Plan: Continue triple therapy Trelegy inhaler PRN albuterol HFA for shortness of breath and wheezing PRN albuterol nebulizer. Patient to call us and let us know if he is having any respiratory complaints or symptoms of exacerbation. Continue to monitor on current therapy. Pulmonary  function test to be completed prior to next office visit. Patient states that he does not need refills at this time of any of his inhaler or respiratory medications.  Patient return to clinic in 6 months or as needed for symptoms.  Greater than 50% of this patient's 45-minute of visit was been face-to-face discussing the above recommendations and treatment plan.    Current Outpatient Medications:  .  AQUALANCE LANCETS 30G MISC, USE TO check blood glucose twice daily, Disp: , Rfl: 99 .  atorvastatin (LIPITOR) 20 MG tablet, Take 1 tablet (20 mg total) by mouth daily., Disp: 90 tablet, Rfl: 3 .  budesonide-formoterol (SYMBICORT) 160-4.5 MCG/ACT inhaler, Inhale 2 puffs into the lungs 2 (two) times daily. , Disp: , Rfl:  .  cetirizine (ZYRTEC) 10 MG tablet, Take 1 tablet (10 mg  total) by mouth daily., Disp: 30 tablet, Rfl: 11 .  diltiazem (DILACOR XR) 240 MG 24 hr capsule, Take 240 mg by mouth daily., Disp: , Rfl:  .  dofetilide (TIKOSYN) 250 MCG capsule, Take 1 capsule (250 mcg total) by mouth 2 (two) times daily., Disp:  , Rfl:  .  fluticasone (FLONASE) 50 MCG/ACT nasal spray, Place 2 sprays into both nostrils daily. (Patient taking differently: Place 2 sprays into both nostrils daily as needed for allergies. ), Disp: 16 g, Rfl: 6 .  furosemide (LASIX) 40 MG tablet, TAKE 1 TABLET BY MOUTH EVERY DAY, Disp: 90 tablet, Rfl: 0 .  gabapentin (NEURONTIN) 300 MG capsule, Take 600 mg by mouth 4 (four) times daily., Disp: , Rfl:  .  guaiFENesin (MUCINEX) 600 MG 12 hr tablet, Take 1,200 mg by mouth daily as needed for cough. , Disp: , Rfl:  .  insulin lispro (HUMALOG) 100 UNIT/ML injection, Inject into the skin 3 (three) times daily before meals. Sliding scale, Disp: , Rfl:  .  ipratropium-albuterol (DUONEB) 0.5-2.5 (3) MG/3ML SOLN, Take 3 mLs by nebulization 4 (four) times daily. Only take when out of Xopenex, Disp: , Rfl:  .  LANTUS SOLOSTAR 100 UNIT/ML Solostar Pen, INJECT 20 UNITS UNDER THE SKIN EVERY MORNING AND INJECT 20 UNITS UNDER THE SKIN EVERY EVENING (Patient taking differently: Inject 18 Units into the skin 2 (two) times daily. ), Disp: 45 mL, Rfl: 0 .  levalbuterol (XOPENEX) 0.63 MG/3ML nebulizer solution, Inhale 3 mLs (0.63 mg total) into the lungs 4 (four) times daily., Disp: 3 mL, Rfl: 0 .  metFORMIN (GLUCOPHAGE) 1000 MG tablet, Take 1 tablet (1,000 mg total) by mouth 2 (two) times daily with a meal., Disp: 180 tablet, Rfl: 3 .  metoprolol tartrate (LOPRESSOR) 50 MG tablet, Take 50 mg by mouth 2 (two) times daily., Disp: , Rfl:  .  Multiple Vitamins-Minerals (COMPLETE MULTIVITAMIN/MINERAL PO), TAKE 1 TABLET BY MOUTH DAILY, Disp: , Rfl: 99 .  polyethylene glycol (MIRALAX / GLYCOLAX) packet, Take 17 g by mouth daily as needed for severe constipation., Disp: , Rfl:  .   PROAIR HFA 108 (90 Base) MCG/ACT inhaler, INHALE TWO PUFFS BY MOUTH EVERY 4 HOURS AS NEEDED FOR WHEEZING, Disp: 17 g, Rfl: 0 .  rivaroxaban (XARELTO) 20 MG TABS tablet, Take 1 tablet (20 mg total) by mouth daily., Disp: 30 tablet, Rfl: 0 .  tamsulosin (FLOMAX) 0.4 MG CAPS capsule, Take 1 capsule (0.4 mg total) by mouth 2 (two) times daily after a meal., Disp: 180 capsule, Rfl: 1 .  tiotropium (SPIRIVA HANDIHALER) 18 MCG inhalation capsule, Place 1 capsule (18 mcg total) into inhaler and inhale daily., Disp:  30 capsule, Rfl: 12 .  traZODone (DESYREL) 50 MG tablet, Take 50 mg by mouth at bedtime., Disp: , Rfl: [place], DO [facility] 11/05/2018 4:06 PM

## 2018-11-05 NOTE — Patient Instructions (Addendum)
Thank you for visiting Dr. Valeta Harms at [facility]. Today we recommend the following: Orders Placed This Encounter  Procedures  . Pulmonary Function Test   Continue trelegy inhaler  Continue albuterol inhaler as needed.   Return in about 3 months (around 02/05/2019) for with [facility] or Dr. Valeta Harms.    Please do your part to reduce the spread of COVID-19.

## 2019-01-18 ENCOUNTER — Other Ambulatory Visit: Payer: Self-pay

## 2019-01-18 ENCOUNTER — Emergency Department (HOSPITAL_COMMUNITY)
Admission: EM | Admit: 2019-01-18 | Discharge: 2019-01-18 | Disposition: A | Discharge status: Home / Self Care | Payer: Medicare Other | Attending: Emergency Medicine | Admitting: Emergency Medicine

## 2019-01-18 ENCOUNTER — Emergency Department (HOSPITAL_COMMUNITY): Payer: Medicare Other

## 2019-01-18 ENCOUNTER — Encounter (HOSPITAL_COMMUNITY): Payer: Self-pay | Admitting: Emergency Medicine

## 2019-01-18 DIAGNOSIS — J449 Chronic obstructive pulmonary disease, unspecified: Secondary | ICD-10-CM

## 2019-01-18 DIAGNOSIS — Z7901 Long term (current) use of anticoagulants: Secondary | ICD-10-CM | POA: Diagnosis not present

## 2019-01-18 DIAGNOSIS — Z87891 Personal history of nicotine dependence: Secondary | ICD-10-CM | POA: Insufficient documentation

## 2019-01-18 DIAGNOSIS — Y9389 Activity, other specified: Secondary | ICD-10-CM | POA: Insufficient documentation

## 2019-01-18 DIAGNOSIS — M545 Low back pain: Secondary | ICD-10-CM | POA: Diagnosis present

## 2019-01-18 DIAGNOSIS — Y999 Unspecified external cause status: Secondary | ICD-10-CM | POA: Diagnosis not present

## 2019-01-18 DIAGNOSIS — I4891 Unspecified atrial fibrillation: Secondary | ICD-10-CM | POA: Insufficient documentation

## 2019-01-18 DIAGNOSIS — X503XXA Overexertion from repetitive movements, initial encounter: Secondary | ICD-10-CM | POA: Diagnosis not present

## 2019-01-18 DIAGNOSIS — C3411 Malignant neoplasm of upper lobe, right bronchus or lung: Secondary | ICD-10-CM | POA: Diagnosis not present

## 2019-01-18 DIAGNOSIS — E119 Type 2 diabetes mellitus without complications: Secondary | ICD-10-CM | POA: Insufficient documentation

## 2019-01-18 DIAGNOSIS — Z79899 Other long term (current) drug therapy: Secondary | ICD-10-CM | POA: Diagnosis not present

## 2019-01-18 DIAGNOSIS — I110 Hypertensive heart disease with heart failure: Secondary | ICD-10-CM | POA: Diagnosis not present

## 2019-01-18 DIAGNOSIS — Z794 Long term (current) use of insulin: Secondary | ICD-10-CM | POA: Insufficient documentation

## 2019-01-18 DIAGNOSIS — Y929 Unspecified place or not applicable: Secondary | ICD-10-CM | POA: Diagnosis not present

## 2019-01-18 DIAGNOSIS — I5032 Chronic diastolic (congestive) heart failure: Secondary | ICD-10-CM | POA: Diagnosis not present

## 2019-01-18 DIAGNOSIS — S39012A Strain of muscle, fascia and tendon of lower back, initial encounter: Secondary | ICD-10-CM | POA: Diagnosis not present

## 2019-01-18 MED ORDER — HYDROCODONE-ACETAMINOPHEN 5-325 MG PO TABS: 1.0000 | ORAL_TABLET | ORAL | 0 refills | Status: DC | PRN

## 2019-01-18 MED ORDER — OXYCODONE-ACETAMINOPHEN 5-325 MG PO TABS
1.0000 | ORAL_TABLET | Freq: Once | ORAL | Status: AC
  Administered 2019-01-18: 05:00:00 1 via ORAL
  Filled 2019-01-18: qty 1

## 2019-01-18 MED ORDER — DIAZEPAM 5 MG PO TABS
5.0000 mg | ORAL_TABLET | Freq: Once | ORAL | Status: AC
  Administered 2019-01-18: 5 mg via ORAL
  Filled 2019-01-18: qty 1

## 2019-01-18 MED ORDER — DIAZEPAM 5 MG PO TABS: 5.0000 mg | ORAL_TABLET | Freq: Two times a day (BID) | ORAL | 0 refills | Status: DC

## 2019-01-18 NOTE — ED Notes (Signed)
Pt transported to xray 

## 2019-01-18 NOTE — ED Triage Notes (Signed)
Pt arrives via [facility] w/complaints of back pain & cough [phone] due to COPD. Pt told EMS that he came here for pain medication.

## 2019-01-18 NOTE — ED Notes (Signed)
Pt back from x-ray.

## 2019-01-18 NOTE — ED Provider Notes (Signed)
[facility] Provider Note   CSN: [id number] Arrival date & time: 01/18/19  3818     History Chief Complaint  Patient presents with  . Back Pain    Chase Herrera is a 68 y.o. male.  Pt presents to the ED today with back pain.  Pt has a hx of COPD and has a cough that is chronic.  He said when he coughs and moves, his back hurts.  The pt denies any f/c.  He said his breathing has been slightly worse than nl, but he's on prednisone for a COPD exacerbation.  Pt denies any bowel or bladder problems.  He's able to walk.  He denies any trauma.        Past Medical History:  Diagnosis Date  . Alcohol abuse    Quit 08/23/11  . Arthritis   . Atrial fibrillation ([place])   . Atrial flutter ([place])    CTI ablation by Dr [name] 10/2011  . Bacteremia due to Gram-positive bacteria 11/29/2016  . COPD (chronic obstructive pulmonary disease) ([place])   . Depression   . Essential hypertension   . History of cardiomyopathy    LVEF 25-30% 08/2011 with subsequent normalization  . History of kidney stones   . Obesity   . Prostate enlargement   . Small cell lung cancer ([place])    Right upper lobe - follows with [facility]  . Type 2 diabetes mellitus [facility])     Patient Active Problem List   Diagnosis Date Noted  . Chronic diastolic CHF (congestive heart failure) ([place]) 10/28/2017  . H/O adenomatous polyp of colon 04/29/2017  . Port-A-Cath in place   . Malignant neoplasm of lung ([place])   . Aortic atherosclerosis ([place]) 04/23/2017  . Hepatic steatosis 04/23/2017  . Type 2 diabetes mellitus ([place])   . Essential hypertension   . Atrial fibrillation ([place]) 11/29/2016  . Diabetes mellitus type 2 in obese ([place]) 11/29/2016  . Healthcare-associated pneumonia 11/29/2016  . Small cell lung cancer ([place]) 04/03/2016  . COPD (chronic obstructive pulmonary disease) ([place]) 02/08/2012  . Atrial flutter ([place]) 01/06/2012  . Nonischemic cardiomyopathy ([place]) 10/15/2011  . Tobacco abuse  10/15/2011    Past Surgical History:  Procedure Laterality Date  . APPENDECTOMY    . ATRIAL ABLATION SURGERY  11/06/2011   CTI ablation for atrial flutter by Dr [name]  . ATRIAL FLUTTER ABLATION N/A 11/06/2011   Procedure: ATRIAL FLUTTER ABLATION;  Surgeon: Thompson Grayer, MD;  Location: [facility] CATH LAB;  Service: Cardiovascular;  Laterality: N/A;  . Cataracts    . COLONOSCOPY WITH PROPOFOL N/A 06/06/2017   Procedure: COLONOSCOPY WITH PROPOFOL;  Surgeon: [name], MD;  Location[facility] ENDO [facility];  Service: Endoscopy;  Laterality: N/A;  1:15pm  . IR GENERIC HISTORICAL  04/09/2016   IR US GUIDE VASC ACCESS RIGHT 04/09/2016 [name], DO [facility]-INTERV RAD  . IR GENERIC HISTORICAL  04/09/2016   IR FLUORO GUIDE PORT INSERTION RIGHT 04/09/2016 [name], DO [facility]  . PORT-A-CATH REMOVAL N/A 04/24/2017   Procedure: MINOR REMOVAL PORT-A-CATH;  Surgeon: [name], MD;  Location: [facility] ORS;  Service: General;  Laterality: N/A;  . TONSILLECTOMY         Family History  Problem Relation Age of Onset  . Diabetes Sister   . Hypertension Mother   . Lung cancer Mother   . Hypertension Father   . Heart attack Father   . Hypertension Sister   . Hypertension Sister   .  Hypertension Sister   . Pulmonary fibrosis Sister   . Breast cancer Cousin   . Colon cancer Neg Hx     Social History   Tobacco Use  . Smoking status: Former Smoker    Packs/day: 0.50    Years: 47.00    Pack years: 23.50    Types: Cigarettes    Quit date: 05/30/2012    Years since quitting: 6.6  . Smokeless tobacco: Former Systems developer    Quit date: 04/10/2014  . Tobacco comment: encouraged to quit today 03/04/12  Substance Use Topics  . Alcohol use: No    Comment: quit 3 1/2 years ago  . Drug use: Not Currently    Comment: marajuana occasionally    Home Medications Prior to Admission medications   Medication Sig Start Date End Date Taking? Authorizing Provider  AQUALANCE LANCETS 30G MISC USE TO check blood glucose  twice daily 06/19/17   [provider]  atorvastatin (LIPITOR) 20 MG tablet Take 1 tablet (20 mg total) by mouth daily. 01/03/17   [name], MD  budesonide-formoterol (SYMBICORT) 160-4.5 MCG/ACT inhaler Inhale 2 puffs into the lungs 2 (two) times daily.  08/28/16   [provider]  cetirizine (ZYRTEC) 10 MG tablet Take 1 tablet (10 mg total) by mouth daily. 06/03/17   [name], MD  diazepam (VALIUM) 5 MG tablet Take 1 tablet (5 mg total) by mouth 2 (two) times daily. 01/18/19   Isla Pence, MD  diltiazem (DILACOR XR) 240 MG 24 hr capsule Take 240 mg by mouth daily.    [provider]  dofetilide (TIKOSYN) 250 MCG capsule Take 1 capsule (250 mcg total) by mouth 2 (two) times daily. 09/15/18   Satira Sark, MD  fluticasone (FLONASE) 50 MCG/ACT nasal spray Place 2 sprays into both nostrils daily. Patient taking differently: Place 2 sprays into both nostrils daily as needed for allergies.  03/06/17   [name], MD  furosemide (LASIX) 40 MG tablet TAKE 1 TABLET BY MOUTH EVERY DAY 06/07/17   [name], MD  gabapentin (NEURONTIN) 300 MG capsule Take 600 mg by mouth 4 (four) times daily.    [provider]  guaiFENesin (MUCINEX) 600 MG 12 hr tablet Take 1,200 mg by mouth daily as needed for cough.     [provider]  HYDROcodone-acetaminophen (NORCO/VICODIN) 5-325 MG tablet Take 1 tablet by mouth every 4 (four) hours as needed. 01/18/19   Isla Pence, MD  HYDROcodone-acetaminophen (NORCO/VICODIN) 5-325 MG tablet Take 1 tablet by mouth every 4 (four) hours as needed. 01/18/19   Isla Pence, MD  insulin lispro (HUMALOG) 100 UNIT/ML injection Inject into the skin 3 (three) times daily before meals. Sliding scale    [provider]  ipratropium-albuterol (DUONEB) 0.5-2.5 (3) MG/3ML SOLN Take 3 mLs by nebulization 4 (four) times daily. Only take when out of Xopenex    [provider]  LANTUS SOLOSTAR 100 UNIT/ML  Solostar Pen INJECT 20 UNITS UNDER THE SKIN EVERY MORNING AND INJECT [place] EVENING Patient taking differently: Inject 18 Units into the skin 2 (two) times daily.  05/02/17   [name], MD  levalbuterol Penne Lash) 0.63 MG/3ML nebulizer solution Inhale 3 mLs (0.63 mg total) into the lungs 4 (four) times daily. 01/03/17   [name], MD  metFORMIN (GLUCOPHAGE) 1000 MG tablet Take 1 tablet (1,000 mg total) by mouth 2 (two) times daily with a meal. 06/04/17   [name], MD  metoprolol tartrate (LOPRESSOR) 50 MG tablet  Take 50 mg by mouth 2 (two) times daily.    [provider]  Multiple Vitamins-Minerals (COMPLETE MULTIVITAMIN/MINERAL PO) TAKE 1 TABLET BY MOUTH DAILY 03/05/17   [provider]  polyethylene glycol (MIRALAX / GLYCOLAX) packet Take 17 g by mouth daily as needed for severe constipation.    [provider]  PROAIR HFA 108 (90 Base) MCG/ACT inhaler INHALE TWO PUFFS BY MOUTH EVERY 4 HOURS AS NEEDED FOR WHEEZING 06/11/17   [name], MD  rivaroxaban (XARELTO) 20 MG TABS tablet Take 1 tablet (20 mg total) by mouth daily. 05/22/17 02/21/19  [name], MD  tamsulosin (FLOMAX) 0.4 MG CAPS capsule Take 1 capsule (0.4 mg total) by mouth 2 (two) times daily after a meal. 01/03/17   [name], MD  tiotropium (SPIRIVA HANDIHALER) 18 MCG inhalation capsule Place 1 capsule (18 mcg total) into inhaler and inhale daily. 03/06/17   [name], MD  traZODone (DESYREL) 50 MG tablet Take 50 mg by mouth at bedtime. 04/04/17   [provider]    Allergies    Patient has no known allergies.  Review of Systems   Review of Systems  Respiratory: Positive for cough.   Musculoskeletal: Positive for back pain.  All other systems reviewed and are negative.   Physical Exam Updated Vital Signs BP (!) 144/83 (BP Location: Right Arm)   Pulse 99   Temp 98.7 F (37.1 C) (Oral)   Resp 20   Ht 6\' 1"  (1.854 m)   Wt 117.9 kg   SpO2  95%   BMI 34.30 kg/m   Physical Exam Vitals and nursing note reviewed.  Constitutional:      Appearance: Normal appearance. He is obese.  HENT:     Head: Normocephalic and atraumatic.     Right Ear: External ear normal.     Left Ear: External ear normal.     Nose: Nose normal.     Mouth/Throat:     Mouth: Mucous membranes are dry.  Eyes:     Extraocular Movements: Extraocular movements intact.     Conjunctiva/sclera: Conjunctivae normal.     Pupils: Pupils are equal, round, and reactive to light.  Cardiovascular:     Rate and Rhythm: Normal rate. Rhythm irregular.     Pulses: Normal pulses.     Heart sounds: Normal heart sounds.  Pulmonary:     Effort: Pulmonary effort is normal.     Breath sounds: Wheezing present.  Abdominal:     General: Abdomen is flat. Bowel sounds are normal.     Palpations: Abdomen is soft.  Musculoskeletal:        General: Normal range of motion.     Cervical back: Normal range of motion and neck supple.       Back:  Skin:    General: Skin is warm.     Capillary Refill: Capillary refill takes less than 2 seconds.     Findings: No rash.  Neurological:     General: No focal deficit present.     Mental Status: He is alert and oriented to person, place, and time.  Psychiatric:        Mood and Affect: Mood normal.        Behavior: Behavior normal.        Thought Content: Thought content normal.        Judgment: Judgment normal.     ED Results / Procedures / Treatments   Labs (all labs ordered are listed, but only abnormal results  are displayed) Labs Reviewed - No data to display  EKG None  Radiology DG Chest 2 View  Result Date: 01/18/2019 CLINICAL DATA:  68 year old male with cough and back pain. History of treated right lung cancer. EXAM: CHEST - 2 VIEW COMPARISON:  01/02/2019 and earlier. FINDINGS: Portable AP upright view at 0600 hours. Lower lung volumes. Lingula bronchiectasis and scarring with consolidation, as well as right  perihilar radiation changes demonstrated by CT on 01/08/2019. Stable cardiac size and mediastinal contours. No pneumothorax, pulmonary edema, pleural effusion or acute pulmonary opacity identified. Stable visualized osseous structures. IMPRESSION: Chronic lung disease and right perihilar radiation changes. Lower lung volumes but no other acute cardiopulmonary abnormality identified. Electronically Signed   By: Genevie Ann M.D.   On: 01/18/2019 06:43   DG Lumbar Spine Complete  Result Date: 01/18/2019 CLINICAL DATA:  68 year old male with cough and back pain. History of treated right lung cancer. EXAM: LUMBAR SPINE - COMPLETE 4+ VIEW COMPARISON:  CT CHEST, abdomen and Pelvis 03/25/2017. FINDINGS: Transitional lumbosacral anatomy with fully lumbarized S1 level demonstrated on the comparison. S1 spina bifida occulta, normal variant. Chronic right S1 pars fracture better demonstrated by CT. Stable vertebral height and alignment with subtle anterolisthesis at L5-S1 and straightening of lumbar lordosis elsewhere. Mild to moderate lower lumbar facet degeneration. Disc spaces remain relatively preserved. Visible bone mineralization appears within normal limits. No acute osseous abnormality identified. SI joints appear within normal limits. Calcified aortic atherosclerosis. Negative abdominal visceral contours. IMPRESSION: 1.  No acute osseous abnormality identified in the lumbar spine. 2. Transitional lumbosacral anatomy with a fully lumbarized S1 level, S1 spina bifida occulta and chronic right S1 pars fracture. 3.  Aortic Atherosclerosis (ICD10-I70.0). Electronically Signed   By: Genevie Ann M.D.   On: 01/18/2019 06:46    Procedures Procedures (including critical care time)  Medications Ordered in ED Medications  diazepam (VALIUM) tablet 5 mg (5 mg Oral Given 01/18/19 0450)  oxyCODONE-acetaminophen (PERCOCET/ROXICET) 5-325 MG per tablet 1 tablet (1 tablet Oral Given 01/18/19 0450)    ED Course  I have reviewed  the triage vital signs and the nursing notes.  Pertinent labs & imaging results that were available during my care of the patient were reviewed by me and considered in my medical decision making (see chart for details).    MDM Rules/Calculators/A&P                     There is no evidence of fx on lumbar film.  No pna on cxr and vitals are good.  Pt has no red flags.  Pain has improved.  He is stable for d/c home.  Return if worse.  Final Clinical Impression(s) / ED Diagnoses Final diagnoses:  Strain of lumbar region, initial encounter  Chronic obstructive pulmonary disease, unspecified COPD type ([place])    Rx / DC Orders ED Discharge Orders         Ordered    HYDROcodone-acetaminophen (NORCO/VICODIN) 5-325 MG tablet  Every 4 hours PRN     01/18/19 0652    diazepam (VALIUM) 5 MG tablet  2 times daily     01/18/19 0652    HYDROcodone-acetaminophen (NORCO/VICODIN) 5-325 MG tablet  Every 4 hours PRN     01/18/19 0657           Isla Pence, MD 01/18/19 [phone]

## 2019-01-19 ENCOUNTER — Encounter: Payer: Self-pay | Admitting: Cardiology

## 2019-01-19 ENCOUNTER — Ambulatory Visit (INDEPENDENT_AMBULATORY_CARE_PROVIDER_SITE_OTHER): Payer: Medicare Other | Admitting: Cardiology

## 2019-01-19 VITALS — BP 111/64 | HR 86 | Ht 73.0 in | Wt 260.0 lb

## 2019-01-19 DIAGNOSIS — J449 Chronic obstructive pulmonary disease, unspecified: Secondary | ICD-10-CM | POA: Diagnosis not present

## 2019-01-19 DIAGNOSIS — I4892 Unspecified atrial flutter: Secondary | ICD-10-CM

## 2019-01-19 DIAGNOSIS — I4891 Unspecified atrial fibrillation: Secondary | ICD-10-CM | POA: Diagnosis not present

## 2019-01-19 DIAGNOSIS — Z8679 Personal history of other diseases of the circulatory system: Secondary | ICD-10-CM | POA: Diagnosis not present

## 2019-01-19 DIAGNOSIS — Z7901 Long term (current) use of anticoagulants: Secondary | ICD-10-CM

## 2019-01-19 MED FILL — Hydrocodone-Acetaminophen Tab 5-325 MG: ORAL | Qty: 6 | Status: AC

## 2019-01-19 NOTE — Patient Instructions (Signed)
Medication Instructions:  Continue all current medications.  Labwork:  CBC, BMET - orders given today.   Office will contact with results via phone or letter.    Testing/Procedures: none  Follow-Up: Your physician wants you to follow up in: 6 months.  You will receive a reminder letter in the mail one-two months in advance.  If you don't receive a letter, please call our office to schedule the follow up appointment   Any Other Special Instructions Will Be Listed Below (If Applicable).  If you need a refill on your cardiac medications before your next appointment, please call your pharmacy.

## 2019-01-19 NOTE — Progress Notes (Signed)
Cardiology Office Note  Date: 01/19/2019   ID: Dejay, Kronk [birth year], MRN [id number]  PCP:  [name], NP  Cardiologist:  [name], MD Electrophysiologist:  None   Chief Complaint  Patient presents with  . Cardiac follow-up    History of Present Illness: Chase Herrera is a 68 y.o. male last assessed via telehealth encounter in August.  He presents for a routine visit.  From a cardiac perspective, he continues to report improvement in palpitations since being on Tikosyn.  We went over his medications which are listed below.  He does not report any spontaneous bleeding problems on Xarelto.  I personally reviewed his ECG today which shows normal sinus rhythm with poor R wave progression as before, QTC 457 ms.  We went over his medications which are outlined below.  He reports compliance and no obvious intolerances.  We discussed getting follow-up lab work.  He has not yet pursued follow-up for discussion of ablation with Dr. Willis Modena at [facility].  He mainly cites problems with transportation.  Past Medical History:  Diagnosis Date  . Alcohol abuse    Quit 08/23/11  . Arthritis   . Atrial fibrillation ([place])   . Atrial flutter ([place])    CTI ablation by Dr [name] 10/2011  . Bacteremia due to Gram-positive bacteria 11/29/2016  . COPD (chronic obstructive pulmonary disease) ([place])   . Depression   . Essential hypertension   . History of cardiomyopathy    LVEF 25-30% 08/2011 with subsequent normalization  . History of kidney stones   . Obesity   . Prostate enlargement   . Small cell lung cancer ([place])    Right upper lobe - follows with [facility]  . Type 2 diabetes mellitus ([place])     Past Surgical History:  Procedure Laterality Date  . APPENDECTOMY    . ATRIAL ABLATION SURGERY  11/06/2011   CTI ablation for atrial flutter by Dr [name]  . ATRIAL FLUTTER ABLATION N/A 11/06/2011   Procedure: ATRIAL FLUTTER ABLATION;  Surgeon: Thompson Grayer, MD;   Location: [facility] CATH LAB;  Service: Cardiovascular;  Laterality: N/A;  . Cataracts    . COLONOSCOPY WITH PROPOFOL N/A 06/06/2017   Procedure: COLONOSCOPY WITH PROPOFOL;  Surgeon: [name], MD;  [facility];  Service: Endoscopy;  Laterality: N/A;  1:15pm  . IR GENERIC HISTORICAL  04/09/2016   IR US GUIDE VASC ACCESS RIGHT 04/09/2016 [name], DO [facility]INTER[facility]  . IR GENERIC HISTORICAL  04/09/2016   IR FLUORO GUIDE PORT INSERTION RIGHT 04/09/2016 [name], DO [facility]  . PORT-A-CATH REMOVAL N/A 04/24/2017   Procedure: MINOR REMOVAL PORT-A-CATH;  Surgeon: [name], MD;  Location: [facility] ORS;  Service: General;  Laterality: N/A;  . TONSILLECTOMY      Current Outpatient Medications  Medication Sig Dispense Refill  . AQUALANCE LANCETS 30G MISC USE TO check blood glucose twice daily  99  . atorvastatin (LIPITOR) 20 MG tablet Take 1 tablet (20 mg total) by mouth daily. 90 tablet 3  . budesonide-formoterol (SYMBICORT) 160-4.5 MCG/ACT inhaler Inhale 2 puffs into the lungs 2 (two) times daily.     . cetirizine (ZYRTEC) 10 MG tablet Take 1 tablet (10 mg total) by mouth daily. 30 tablet 11  . diltiazem (DILACOR XR) 240 MG 24 hr capsule Take 240 mg by mouth daily.    Marland Kitchen dofetilide (TIKOSYN) 250 MCG capsule Take 1 capsule (250 mcg total) by mouth 2 (two)  times daily.    . fluticasone (FLONASE) 50 MCG/ACT nasal spray Place 2 sprays into both nostrils daily. 16 g 6  . furosemide (LASIX) 40 MG tablet TAKE 1 TABLET BY MOUTH EVERY DAY 90 tablet 0  . gabapentin (NEURONTIN) 300 MG capsule Take 600 mg by mouth 4 (four) times daily.    Marland Kitchen guaiFENesin (MUCINEX) 600 MG 12 hr tablet Take 1,200 mg by mouth daily as needed for cough.     Marland Kitchen HYDROcodone-acetaminophen (NORCO/VICODIN) 5-325 MG tablet Take 1 tablet by mouth every 4 (four) hours as needed. 10 tablet 0  . HYDROcodone-acetaminophen (NORCO/VICODIN) 5-325 MG tablet Take 1 tablet by mouth every 4 (four) hours as needed. 6 tablet 0  .  insulin lispro (HUMALOG) 100 UNIT/ML injection Inject into the skin 3 (three) times daily before meals. Sliding scale    . ipratropium-albuterol (DUONEB) 0.5-2.5 (3) MG/3ML SOLN Take 3 mLs by nebulization 4 (four) times daily. Only take when out of Xopenex    . LANTUS SOLOSTAR 100 UNIT/ML Solostar Pen INJECT 20 UNITS UNDER THE SKIN EVERY MORNING AND INJECT 20 UNITS UNDER THE SKIN EVERY EVENING (Patient taking differently: Inject 18 Units into the skin 2 (two) times daily. ) 45 mL 0  . levalbuterol (XOPENEX) 0.63 MG/3ML nebulizer solution Inhale 3 mLs (0.63 mg total) into the lungs 4 (four) times daily. 3 mL 0  . metFORMIN (GLUCOPHAGE) 1000 MG tablet Take 1 tablet (1,000 mg total) by mouth 2 (two) times daily with a meal. 180 tablet 3  . metoprolol tartrate (LOPRESSOR) 50 MG tablet Take 50 mg by mouth 2 (two) times daily.    . Multiple Vitamins-Minerals (COMPLETE MULTIVITAMIN/MINERAL PO) TAKE 1 TABLET BY MOUTH DAILY  99  . polyethylene glycol (MIRALAX / GLYCOLAX) packet Take 17 g by mouth daily as needed for severe constipation.    Marland Kitchen PROAIR HFA 108 (90 Base) MCG/ACT inhaler INHALE TWO PUFFS BY MOUTH EVERY 4 HOURS AS NEEDED FOR WHEEZING 17 g 0  . rivaroxaban (XARELTO) 20 MG TABS tablet Take 1 tablet (20 mg total) by mouth daily. 30 tablet 0  . spironolactone (ALDACTONE) 25 MG tablet Take 12.5 mg by mouth 2 (two) times daily.    . tamsulosin (FLOMAX) 0.4 MG CAPS capsule Take 1 capsule (0.4 mg total) by mouth 2 (two) times daily after a meal. 180 capsule 1  . tiotropium (SPIRIVA HANDIHALER) 18 MCG inhalation capsule Place 1 capsule (18 mcg total) into inhaler and inhale daily. 30 capsule 12  . traZODone (DESYREL) 50 MG tablet Take 50 mg by mouth at bedtime.  5   No current facility-administered medications for this visit.   Allergies:  Patient has no known allergies.   Social History: The patient  reports that he quit smoking about 6 years ago. His smoking use included cigarettes. He has a 23.50  pack-year smoking history. He quit smokeless tobacco use about 4 years ago. He reports previous drug use. He reports that he does not drink alcohol.   ROS:  Please see the history of present illness. Otherwise, complete review of systems is positive for back pain.  All other systems are reviewed and negative.   Physical Exam: VS:  BP 111/64   Pulse 86   Ht 6\' 1"  (1.854 m)   Wt 260 lb (117.9 kg) Comment: Pt did not wanted to weight.  BMI 34.30 kg/m , BMI Body mass index is 34.3 kg/m.  Wt Readings from Last 3 Encounters:  01/19/19 260 lb (117.9 kg)  01/18/19  260 lb (117.9 kg)  11/05/18 259 lb (117.5 kg)    General: Chronically ill appearing obese male seated in wheelchair and wearing oxygen.Marland Kitchen HEENT: Conjunctiva and lids normal, wearing a mask. Neck: Supple, no elevated JVP or carotid bruits, no thyromegaly. Lungs: Decreased breath sounds throughout, nonlabored breathing at rest. Cardiac: Regular rate and rhythm, distant, no significant systolic murmur, no pericardial rub. Abdomen: Protuberant, nontender, bowel sounds present. Extremities: Chronic appearing lower leg edema, distal pulses 1-2+. Skin: Warm and dry. Musculoskeletal: No kyphosis. Neuropsychiatric: Alert and oriented x3, affect grossly appropriate.  ECG:  An ECG dated 08/30/2018 was personally reviewed today and demonstrated:  Normal sinus rhythm with low voltage, poor R wave progression, QTC 436 ms.  Recent Labwork:    Component Value Date/Time   CHOL 100 06/03/2017 1124   TRIG 103 06/03/2017 1124   HDL 40 (L) 06/03/2017 1124   CHOLHDL 2.5 06/03/2017 1124   LDLCALC 41 06/03/2017 1124  August 2020: BUN 23, creatinine 1.06, potassium 4.2, troponin T 0.02, AST 17, ALT 24, hemoglobin 13.2, platelets 205  Other Studies Reviewed Today:  [facility]): LVEF 55 to 60%, normal left atrial chamber size, normal right ventricular contraction, no pericardial effusion.  Assessment and  Plan:  1.  Persistent atrial fibrillation/flutter.  He is in sinus rhythm today and continues on Tikosyn and Xarelto along with stable AV nodal blocker regimen.  Follow-up CBC and BMP T.  ECG reviewed.  I recommended that he keep in contact with the EP service at [facility] as well.  2.  Severe oxygen dependent COPD with chronic hypoxic respiratory failure.  3.  History of nonischemic cardiomyopathy with normalization of LVEF.  4.  History of small cell lung cancer, he continues to follow with oncology.  Medication Adjustments/Labs and Tests Ordered: Current medicines are reviewed at length with the patient today.  Concerns regarding medicines are outlined above.   Tests Ordered: Orders Placed This Encounter  Procedures  . CBC  . Basic metabolic panel  . EKG 12-Lead    Medication Changes: No orders of the defined types were placed in this encounter.   Disposition:  Follow up 6 months in the [place] office.  Signed, Satira Sark, MD, [facility] 01/19/2019 2:19 PM    [facility] [place], [address], [place] [address] Phone: [phone]; Fax: [phone]

## 2019-01-21 ENCOUNTER — Other Ambulatory Visit (HOSPITAL_COMMUNITY): Payer: Self-pay | Admitting: Radiation Oncology

## 2019-01-21 ENCOUNTER — Telehealth: Payer: Self-pay | Admitting: *Deleted

## 2019-01-21 DIAGNOSIS — C3411 Malignant neoplasm of upper lobe, right bronchus or lung: Secondary | ICD-10-CM

## 2019-01-21 NOTE — Telephone Encounter (Signed)
Pt voiced understanding - routed to pcp  

## 2019-01-21 NOTE — Telephone Encounter (Signed)
-----   Message from Satira Sark, MD sent at 01/20/2019  8:22 AM EST ----- Results reviewed.  Creatinine 1.28 with normal creatinine clearance, continue current dose of Xarelto.  Also normal hemoglobin.

## 2019-01-26 ENCOUNTER — Other Ambulatory Visit: Payer: Self-pay

## 2019-01-26 ENCOUNTER — Encounter (HOSPITAL_COMMUNITY)
Admission: RE | Admit: 2019-01-26 | Discharge: 2019-01-26 | Disposition: A | Discharge status: Home / Self Care | Payer: Medicare Other | Source: Ambulatory Visit | Attending: Radiation Oncology | Admitting: Radiation Oncology

## 2019-01-26 DIAGNOSIS — C3411 Malignant neoplasm of upper lobe, right bronchus or lung: Secondary | ICD-10-CM | POA: Diagnosis present

## 2019-01-26 MED ORDER — FLUDEOXYGLUCOSE F - 18 (FDG) INJECTION
14.5400 | Freq: Once | INTRAVENOUS | Status: AC | PRN
  Administered 2019-01-26: 14.54 via INTRAVENOUS

## 2019-01-27 ENCOUNTER — Encounter (HOSPITAL_COMMUNITY): Payer: Self-pay | Admitting: *Deleted

## 2019-01-27 NOTE — Progress Notes (Signed)
I spoke with patient on the phone today and advised of his PET scan results. I advised of the need for MR of Brain and biopsy of lung lesion. He voices his desire to have MR Brain done at [facility] in [place] as he has trouble with transportation.  He is aware of his appointment with [name] next Monday 1/11 at 1115.

## 2019-02-01 DIAGNOSIS — J852 Abscess of lung without pneumonia: Secondary | ICD-10-CM | POA: Insufficient documentation

## 2019-02-01 DIAGNOSIS — I2699 Other pulmonary embolism without acute cor pulmonale: Secondary | ICD-10-CM | POA: Insufficient documentation

## 2019-02-02 ENCOUNTER — Ambulatory Visit (HOSPITAL_COMMUNITY): Payer: Medicare Other | Admitting: Hematology

## 2019-02-04 MED ORDER — FUROSEMIDE 10 MG/ML IJ SOLN
40.00 | INTRAMUSCULAR | Status: DC
Start: 2019-02-04 — End: 2019-02-04

## 2019-02-04 MED ORDER — ACETAMINOPHEN 325 MG PO TABS
650.00 | ORAL_TABLET | ORAL | Status: DC
Start: ? — End: 2019-02-04

## 2019-02-04 MED ORDER — GABAPENTIN 300 MG PO CAPS
600.00 | ORAL_CAPSULE | ORAL | Status: DC
Start: 2019-02-10 — End: 2019-02-04

## 2019-02-04 MED ORDER — BENZONATATE 100 MG PO CAPS
100.00 | ORAL_CAPSULE | ORAL | Status: DC
Start: ? — End: 2019-02-04

## 2019-02-04 MED ORDER — LIDOCAINE 5 % EX PTCH
1.00 | MEDICATED_PATCH | CUTANEOUS | Status: DC
Start: 2019-02-11 — End: 2019-02-04

## 2019-02-04 MED ORDER — TRAZODONE HCL 50 MG PO TABS
50.00 | ORAL_TABLET | ORAL | Status: DC
Start: 2019-02-10 — End: 2019-02-04

## 2019-02-04 MED ORDER — BIOTENE DRY MOUTH MOISTURIZING MT SOLN
1.00 | OROMUCOSAL | Status: DC
Start: ? — End: 2019-02-04

## 2019-02-04 MED ORDER — AMMONIUM LACTATE 12 % EX LOTN
1.00 | TOPICAL_LOTION | CUTANEOUS | Status: DC
Start: ? — End: 2019-02-04

## 2019-02-04 MED ORDER — DILTIAZEM HCL ER BEADS 240 MG PO CP24
240.00 | ORAL_CAPSULE | ORAL | Status: DC
Start: 2019-02-11 — End: 2019-02-04

## 2019-02-04 MED ORDER — IPRATROPIUM-ALBUTEROL 0.5-2.5 (3) MG/3ML IN SOLN
3.00 | RESPIRATORY_TRACT | Status: DC
Start: ? — End: 2019-02-04

## 2019-02-04 MED ORDER — HEPARIN SOD (PORCINE) IN D5W 100 UNIT/ML IV SOLN
17.00 | INTRAVENOUS | Status: DC
Start: ? — End: 2019-02-04

## 2019-02-04 MED ORDER — ALBUTEROL SULFATE HFA 108 (90 BASE) MCG/ACT IN AERS
2.00 | INHALATION_SPRAY | RESPIRATORY_TRACT | Status: DC
Start: ? — End: 2019-02-04

## 2019-02-04 MED ORDER — NYSTATIN 100000 UNIT/GM EX POWD
1.00 | CUTANEOUS | Status: DC
Start: 2019-02-10 — End: 2019-02-04

## 2019-02-04 MED ORDER — SPIRONOLACTONE 25 MG PO TABS
12.50 | ORAL_TABLET | ORAL | Status: DC
Start: 2019-02-11 — End: 2019-02-04

## 2019-02-04 MED ORDER — ATORVASTATIN CALCIUM 20 MG PO TABS
20.00 | ORAL_TABLET | ORAL | Status: DC
Start: 2019-02-11 — End: 2019-02-04

## 2019-02-04 MED ORDER — DIPHENHYDRAMINE HCL 25 MG PO CAPS
25.00 | ORAL_CAPSULE | ORAL | Status: DC
Start: ? — End: 2019-02-04

## 2019-02-04 MED ORDER — SODIUM CHLORIDE 3 % IN NEBU
4.00 | INHALATION_SOLUTION | RESPIRATORY_TRACT | Status: DC
Start: 2019-02-10 — End: 2019-02-04

## 2019-02-04 MED ORDER — METRONIDAZOLE IN NACL 5-0.79 MG/ML-% IV SOLN
500.00 | INTRAVENOUS | Status: DC
Start: 2019-02-04 — End: 2019-02-04

## 2019-02-04 MED ORDER — METOPROLOL SUCCINATE ER 100 MG PO TB24
100.00 | ORAL_TABLET | ORAL | Status: DC
Start: 2019-02-11 — End: 2019-02-04

## 2019-02-04 MED ORDER — WH PETROL-MINERAL OIL-LANOLIN 0.1-0.1 % OP OINT
1.00 | TOPICAL_OINTMENT | OPHTHALMIC | Status: DC
Start: 2019-02-10 — End: 2019-02-04

## 2019-02-04 MED ORDER — POLYETHYLENE GLYCOL 3350 17 GM/SCOOP PO POWD
17.00 | ORAL | Status: DC
Start: ? — End: 2019-02-04

## 2019-02-04 MED ORDER — TAMSULOSIN HCL 0.4 MG PO CAPS
0.40 | ORAL_CAPSULE | ORAL | Status: DC
Start: 2019-02-10 — End: 2019-02-04

## 2019-02-04 MED ORDER — DEXTROMETHORPHAN-GUAIFENESIN 10-100 MG/5ML PO LIQD
10.00 | ORAL | Status: DC
Start: ? — End: 2019-02-04

## 2019-02-04 MED ORDER — IPRATROPIUM-ALBUTEROL 0.5-2.5 (3) MG/3ML IN SOLN
3.00 | RESPIRATORY_TRACT | Status: DC
Start: 2019-02-10 — End: 2019-02-04

## 2019-02-04 MED ORDER — QUINTABS PO TABS
1.00 | ORAL_TABLET | ORAL | Status: DC
Start: 2019-02-11 — End: 2019-02-04

## 2019-02-04 MED ORDER — MAGNESIUM OXIDE 400 MG PO TABS
400.00 | ORAL_TABLET | ORAL | Status: DC
Start: 2019-02-04 — End: 2019-02-04

## 2019-02-04 MED ORDER — VANCOMYCIN HCL IN NACL 2-0.9 GM/500ML-% IV SOLN
2000.00 | INTRAVENOUS | Status: DC
Start: 2019-02-04 — End: 2019-02-04

## 2019-02-04 MED ORDER — DOFETILIDE 250 MCG PO CAPS
250.00 | ORAL_CAPSULE | ORAL | Status: DC
Start: 2019-02-10 — End: 2019-02-04

## 2019-02-04 MED ORDER — HYDROCODONE-ACETAMINOPHEN 5-325 MG PO TABS
1.00 | ORAL_TABLET | ORAL | Status: DC
Start: ? — End: 2019-02-04

## 2019-02-04 MED ORDER — GENERIC EXTERNAL MEDICATION
2.00 | Status: DC
Start: 2019-02-04 — End: 2019-02-04

## 2019-02-09 MED ORDER — GLUCOSE 40 % PO GEL
ORAL | Status: DC
Start: ? — End: 2019-02-09

## 2019-02-09 MED ORDER — INSULIN REGULAR HUMAN 100 UNIT/ML IJ SOLN
0.00 | INTRAMUSCULAR | Status: DC
Start: 2019-02-10 — End: 2019-02-09

## 2019-02-09 MED ORDER — FLUTICASONE PROPIONATE 50 MCG/ACT NA SUSP
1.00 | NASAL | Status: DC
Start: 2019-02-11 — End: 2019-02-09

## 2019-02-09 MED ORDER — METFORMIN HCL 500 MG PO TABS
500.00 | ORAL_TABLET | ORAL | Status: DC
Start: 2019-02-11 — End: 2019-02-09

## 2019-02-09 MED ORDER — TETRAHYDROZOLINE HCL 0.05 % OP SOLN
1.00 | OPHTHALMIC | Status: DC
Start: 2019-02-10 — End: 2019-02-09

## 2019-02-09 MED ORDER — GLUCAGON (RDNA) 1 MG IJ KIT
1.00 | PACK | INTRAMUSCULAR | Status: DC
Start: ? — End: 2019-02-09

## 2019-02-09 MED ORDER — AZELASTINE HCL 0.1 % NA SOLN
1.00 | NASAL | Status: DC
Start: 2019-02-10 — End: 2019-02-09

## 2019-02-09 MED ORDER — GENERIC EXTERNAL MEDICATION
Status: DC
Start: ? — End: 2019-02-09

## 2019-02-09 MED ORDER — MAGNESIUM OXIDE 400 MG PO TABS
400.00 | ORAL_TABLET | ORAL | Status: DC
Start: 2019-02-09 — End: 2019-02-09

## 2019-02-09 MED ORDER — FUROSEMIDE 40 MG PO TABS
40.00 | ORAL_TABLET | ORAL | Status: DC
Start: 2019-02-11 — End: 2019-02-09

## 2019-02-09 MED ORDER — DEXTROSE 50 % IV SOLN
25.00 | INTRAVENOUS | Status: DC
Start: ? — End: 2019-02-09

## 2019-02-09 MED ORDER — AMOXICILLIN-POT CLAVULANATE 875-125 MG PO TABS
1.00 | ORAL_TABLET | ORAL | Status: DC
Start: 2019-02-10 — End: 2019-02-09

## 2019-02-09 MED ORDER — SENNOSIDES 8.6 MG PO TABS
1.00 | ORAL_TABLET | ORAL | Status: DC
Start: 2019-02-10 — End: 2019-02-09

## 2019-02-10 MED ORDER — MAGNESIUM OXIDE 400 MG PO TABS
400.00 | ORAL_TABLET | ORAL | Status: DC
Start: 2019-02-10 — End: 2019-02-10

## 2019-02-10 MED ORDER — GENERIC EXTERNAL MEDICATION
Status: DC
Start: ? — End: 2019-02-10

## 2019-02-12 MED ORDER — GENERIC EXTERNAL MEDICATION
Status: DC
Start: ? — End: 2019-02-12

## 2019-02-24 ENCOUNTER — Other Ambulatory Visit: Payer: Self-pay

## 2019-02-24 ENCOUNTER — Encounter (HOSPITAL_COMMUNITY): Payer: Self-pay | Admitting: Hematology

## 2019-02-24 ENCOUNTER — Inpatient Hospital Stay (HOSPITAL_COMMUNITY): Payer: Medicare Other | Attending: Hematology | Admitting: Hematology

## 2019-02-24 VITALS — BP 124/74 | HR 81 | Temp 97.1°F | Resp 16 | Wt 264.0 lb

## 2019-02-24 DIAGNOSIS — Z87891 Personal history of nicotine dependence: Secondary | ICD-10-CM | POA: Insufficient documentation

## 2019-02-24 DIAGNOSIS — J852 Abscess of lung without pneumonia: Secondary | ICD-10-CM

## 2019-02-24 DIAGNOSIS — C3490 Malignant neoplasm of unspecified part of unspecified bronchus or lung: Secondary | ICD-10-CM

## 2019-02-24 DIAGNOSIS — R200 Anesthesia of skin: Secondary | ICD-10-CM | POA: Insufficient documentation

## 2019-02-24 DIAGNOSIS — J449 Chronic obstructive pulmonary disease, unspecified: Secondary | ICD-10-CM | POA: Insufficient documentation

## 2019-02-24 DIAGNOSIS — D696 Thrombocytopenia, unspecified: Secondary | ICD-10-CM | POA: Diagnosis not present

## 2019-02-24 DIAGNOSIS — Z7901 Long term (current) use of anticoagulants: Secondary | ICD-10-CM | POA: Insufficient documentation

## 2019-02-24 DIAGNOSIS — Z79899 Other long term (current) drug therapy: Secondary | ICD-10-CM | POA: Insufficient documentation

## 2019-02-24 DIAGNOSIS — Z833 Family history of diabetes mellitus: Secondary | ICD-10-CM | POA: Insufficient documentation

## 2019-02-24 DIAGNOSIS — Z87442 Personal history of urinary calculi: Secondary | ICD-10-CM | POA: Diagnosis not present

## 2019-02-24 DIAGNOSIS — R591 Generalized enlarged lymph nodes: Secondary | ICD-10-CM | POA: Diagnosis not present

## 2019-02-24 DIAGNOSIS — Z801 Family history of malignant neoplasm of trachea, bronchus and lung: Secondary | ICD-10-CM | POA: Insufficient documentation

## 2019-02-24 DIAGNOSIS — Z803 Family history of malignant neoplasm of breast: Secondary | ICD-10-CM | POA: Insufficient documentation

## 2019-02-24 DIAGNOSIS — Z8249 Family history of ischemic heart disease and other diseases of the circulatory system: Secondary | ICD-10-CM | POA: Insufficient documentation

## 2019-02-24 DIAGNOSIS — C3491 Malignant neoplasm of unspecified part of right bronchus or lung: Secondary | ICD-10-CM | POA: Diagnosis not present

## 2019-02-24 DIAGNOSIS — I2510 Atherosclerotic heart disease of native coronary artery without angina pectoris: Secondary | ICD-10-CM | POA: Insufficient documentation

## 2019-02-24 DIAGNOSIS — Z836 Family history of other diseases of the respiratory system: Secondary | ICD-10-CM | POA: Diagnosis not present

## 2019-02-24 NOTE — Progress Notes (Signed)
[place], [place] [address]   CLINIC:  Medical Oncology/Hematology  PCP:  [name], NP [address] [place] [address] [phone]   REASON FOR VISIT:  Follow-up for small cell lung cancer.  CURRENT THERAPY: Observation.  BRIEF ONCOLOGIC HISTORY:  Oncology History  Small cell lung cancer ([place])  04/03/2016 Initial Diagnosis   Small cell lung cancer ([place])   04/09/2016 Imaging   MRI brain- Negative for metastatic disease to the brain. No acute abnormality.   04/09/2016 Procedure   Right IJ port catheter placement by IR   04/13/2016 PET scan   . The right apical lung mass has significantly enlarged and there is new right paratracheal adenopathy as well as a hypermetabolic new lymph node in the right inner infraclavicular fossa potentially with early impingement on adjacent neurovascular structures. 2. Other imaging findings of potential clinical significance: Chronic bilateral maxillary sinusitis. Left carotid atherosclerotic calcification. Aortoiliac atherosclerotic vascular disease. Coronary atherosclerosis. Paraseptal emphysema. Mild atelectasis in the lung bases. Scattered sigmoid colon diverticula. Probable right pars defect at L5.   05/09/2016 - 07/12/2016 Radiation Therapy   Radiotherapy to right Lung+MS 63 Gy/35 fractions at 1.8 GY/f using 6 x photons 3- D CRT technique Dates: 4/18-6/21/18       06/19/2016 Treatment Plan Change   Cisplatin dose reduced by 20% for cycle #4 due to severe thrombocytopenia following cycle #3 (13,000).   08/21/2016 PET scan   1. Marked reduction in size and metabolic activity of RIGHT upper lobe mass. 2. Resolution of mediastinal and RIGHT supraclavicular lymphadenopathy. 3. No evidence of disease progression.      CANCER STAGING: Cancer Staging Small cell lung cancer ([place]) Staging form: Lung, AJCC 8th Edition - Clinical: Stage IIIC (cT3, cN3, cM0) - Signed by  Twana First, MD on 04/16/2016    INTERVAL HISTORY:  Herrera Herrera 69 y.o. male seen for follow-up of small cell lung cancer.  He was last seen in our clinic in June 2019.  He was lost to follow-up after that.  He was reportedly following up with Dr.Yanagihara in [place].  Because of abnormal findings on the chest imaging, a PET scan was done on 01/26/2019.  Patient reportedly was hospitalized at [facility] after that and was treated for cavitary lung lesion.  He reports great improvement in his symptoms, particularly shortness of breath.  Appetite and energy levels are 100%.  Numbness in the hands and feet from previous cisplatin therapy stable.  Chronic leg swellings are stable.  Denies any fevers or chills.  No cough or expectoration.    REVIEW OF SYSTEMS:  Review of Systems  Cardiovascular: Positive for leg swelling.  Neurological: Positive for numbness.  All other systems reviewed and are negative.    PAST MEDICAL/SURGICAL HISTORY:  Past Medical History:  Diagnosis Date  . Alcohol abuse    Quit 08/23/11  . Arthritis   . Atrial fibrillation ([place])   . Atrial flutter ([place])    CTI ablation by Dr [name] 10/2011  . Bacteremia due to Gram-positive bacteria 11/29/2016  . COPD (chronic obstructive pulmonary disease) ([place])   . Depression   . Essential hypertension   . History of cardiomyopathy    LVEF 25-30% 08/2011 with subsequent normalization  . History of kidney stones   . Obesity   . Prostate enlargement   . Small cell lung cancer ([place])    Right upper lobe - follows with [facility]  .  Type 2 diabetes mellitus ([place])    Past Surgical History:  Procedure Laterality Date  . APPENDECTOMY    . ATRIAL ABLATION SURGERY  11/06/2011   CTI ablation for atrial flutter by Dr [name]  . ATRIAL FLUTTER ABLATION N/A 11/06/2011   Procedure: ATRIAL FLUTTER ABLATION;  Surgeon: Thompson Grayer, MD;  Location: [facility] C[facility] LAB;  Service: Cardiovascular;  Laterality: N/A;  . Cataracts    .  COLONOSCOPY WITH PROPOFOL N/A 06/06/2017   Procedure: COLONOSCOPY WITH PROPOFOL;  Surgeon: [name], MD;  Location: [facility] ENDO SUITE;  Service: Endoscopy;  Laterality: N/A;  1:15pm  . IR GENERIC HISTORICAL  04/09/2016   IR US GUIDE VASC ACCESS RIGHT 04/09/2016 [name], DO [facility]-INTERV RAD  . IR GENERIC HISTORICAL  04/09/2016   IR FLUORO GUIDE PORT INSERTION RIGHT 04/09/2016 [name], DO [facility]-INTERV RAD  . PORT-A-CATH REMOVAL N/A 04/24/2017   Procedure: MINOR REMOVAL PORT-A-CATH;  Surgeon: [name], MD;  Location: [facility] ORS;  Service: General;  Laterality: N/A;  . TONSILLECTOMY       SOCIAL HISTORY:  Social History   Socioeconomic History  . Marital status: Single    Spouse name: Not on file  . Number of children: Not on file  . Years of education: Not on file  . Highest education level: Not on file  Occupational History  . Occupation: retired     Comment: Building services engineer  Tobacco Use  . Smoking status: Former Smoker    Packs/day: 0.50    Years: 47.00    Pack years: 23.50    Types: Cigarettes    Quit date: 05/30/2012    Years since quitting: 6.7  . Smokeless tobacco: Former Systems developer    Quit date: 04/10/2014  . Tobacco comment: encouraged to quit today 03/04/12  Substance and Sexual Activity  . Alcohol use: No    Comment: quit 3 1/2 years ago  . Drug use: Not Currently    Comment: marajuana occasionally  . Sexual activity: Not Currently  Other Topics Concern  . Not on file  Social History Narrative   Lives in [place] alone.   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 Difficulty of Paying Living Expenses: Not on file  Food Insecurity:   . Worried About Charity fundraiser in the Last Year: Not on file  . Ran Out of Food in the Last Year: Not on file  Transportation Needs:   . Lack of Transportation (Medical): Not on file  . Lack of Transportation (Non-Medical): Not on file   Physical Activity:   . Days of Exercise per Week: Not on file  . Minutes of Exercise per Session: Not on file  Stress:   . Feeling of Stress : Not on file  Social Connections:   . Frequency of Communication with Friends and Family: Not on file  . Frequency of Social Gatherings with Friends and Family: Not on file  . Attends Religious Services: Not on file  . Active Member of Clubs or Organizations: Not on file  . Attends Archivist Meetings: Not on file  . Marital Status: Not on file  Intimate Partner Violence:   . Fear of Current or Ex-Partner: Not on file  . Emotionally Abused: Not on file  . Physically Abused: Not on file  . Sexually Abused: Not on file    FAMILY HISTORY:  Family  History  Problem Relation Age of Onset  . Diabetes Sister   . Hypertension Mother   . Lung cancer Mother   . Hypertension Father   . Heart attack Father   . Hypertension Sister   . Hypertension Sister   . Hypertension Sister   . Pulmonary fibrosis Sister   . Breast cancer Cousin   . Colon cancer Neg Hx     CURRENT MEDICATIONS:  Outpatient Encounter Medications as of 02/24/2019  Medication Sig  . ACCU-CHEK AVIVA PLUS test strip check blood sugar TWICE DAILY  . amoxicillin-clavulanate (AUGMENTIN) 875-125 MG tablet Take 1 tablet by mouth 2 (two) times daily.   Marland Kitchen apixaban (ELIQUIS) 5 MG TABS tablet Take 5 mg by mouth 2 (two) times daily.   Marland Kitchen AQUALANCE LANCETS 30G MISC USE TO check blood glucose twice daily  . Artificial Saliva (BIOTENE DRY MOUTH MOISTURIZING) SOLN Apply topically as needed.   Marland Kitchen atorvastatin (LIPITOR) 20 MG tablet Take 1 tablet (20 mg total) by mouth daily.  . budesonide-formoterol (SYMBICORT) 160-4.5 MCG/ACT inhaler Inhale 2 puffs into the lungs 2 (two) times daily.   . cetirizine (ZYRTEC) 10 MG tablet Take 1 tablet (10 mg total) by mouth daily.  Marland Kitchen diltiazem (DILACOR XR) 240 MG 24 hr capsule Take 240 mg by mouth daily.  Marland Kitchen dofetilide (TIKOSYN) 250 MCG capsule Take 1  capsule (250 mcg total) by mouth 2 (two) times daily.  . fluticasone (FLONASE) 50 MCG/ACT nasal spray Place 2 sprays into both nostrils daily.  . furosemide (LASIX) 40 MG tablet TAKE 1 TABLET BY MOUTH EVERY DAY  . gabapentin (NEURONTIN) 300 MG capsule Take 600 mg by mouth 4 (four) times daily.  . insulin lispro (HUMALOG) 100 UNIT/ML injection Inject into the skin 3 (three) times daily before meals. Sliding scale  . ipratropium-albuterol (DUONEB) 0.5-2.5 (3) MG/3ML SOLN Take 3 mLs by nebulization 4 (four) times daily. Only take when out of Xopenex  . Lancet Devices (ADJUSTABLE LANCING DEVICE) MISC TO check blood glucose daily  . LANTUS SOLOSTAR 100 UNIT/ML Solostar Pen INJECT 20 UNITS UNDER THE SKIN EVERY MORNING AND INJECT 20 UNITS UNDER THE SKIN EVERY EVENING (Patient taking differently: Inject 18 Units into the skin 2 (two) times daily. )  . levalbuterol (XOPENEX) 0.63 MG/3ML nebulizer solution Inhale 3 mLs (0.63 mg total) into the lungs 4 (four) times daily.  Marland Kitchen lidocaine (LIDODERM) 5 % Place 1 patch onto the skin daily.   . metFORMIN (GLUCOPHAGE) 1000 MG tablet Take 1 tablet (1,000 mg total) by mouth 2 (two) times daily with a meal.  . metoprolol succinate (TOPROL-XL) 100 MG 24 hr tablet Take 100 mg by mouth daily.  . Multiple Vitamins-Minerals (COMPLETE MULTIVITAMIN/MINERAL PO) TAKE 1 TABLET BY MOUTH DAILY  . potassium chloride (KLOR-CON) 10 MEQ tablet Take 10 mEq by mouth 2 (two) times daily.  Marland Kitchen senna (SENOKOT) 8.6 MG tablet Take 2 tablets by mouth daily.   . sodium chloride HYPERTONIC 3 % nebulizer solution Inhale into the lungs.  . tamsulosin (FLOMAX) 0.4 MG CAPS capsule Take 1 capsule (0.4 mg total) by mouth 2 (two) times daily after a meal.  . tetrahydrozoline 0.05 % ophthalmic solution Administer 1 drop to both eyes Three (3) times a day.  . tiotropium (SPIRIVA HANDIHALER) 18 MCG inhalation capsule Place 1 capsule (18 mcg total) into inhaler and inhale daily.  . traZODone (DESYREL) 50 MG  tablet Take 50 mg by mouth at bedtime.  Dema Severin Petrolatum-Mineral Oil ([place] PETROL-MINERAL OIL-LANOLIN) 0.1-0.1 % OINT  Administer 1 application to both eyes Two (2) times a day.  . [DISCONTINUED] magnesium oxide (MAG-OX) 400 MG tablet Take 400 mg by mouth daily.   . [DISCONTINUED] nystatin (MYCOSTATIN/NYSTOP) powder Apply topically.  Marland Kitchen dextromethorphan-guaiFENesin (ROBITUSSIN-DM) 10-100 MG/5ML liquid Take 10 mLs by mouth as needed.   . diclofenac Sodium (VOLTAREN) 1 % GEL APPLY TO THE AFFECTED AREA(S) TWICE DAILY AS DIRECTED  . guaiFENesin (MUCINEX) 600 MG 12 hr tablet Take 1,200 mg by mouth daily as needed for cough.   Marland Kitchen HYDROcodone-acetaminophen (NORCO/VICODIN) 5-325 MG tablet Take 1 tablet by mouth every 4 (four) hours as needed. (Patient not taking: Reported on 02/24/2019)  . hydrOXYzine (VISTARIL) 25 MG capsule Take 50 mg by mouth every 8 (eight) hours as needed.  . polyethylene glycol (MIRALAX / GLYCOLAX) packet Take 17 g by mouth daily as needed for severe constipation.  Marland Kitchen PROAIR HFA 108 (90 Base) MCG/ACT inhaler INHALE TWO PUFFS BY MOUTH EVERY 4 HOURS AS NEEDED FOR WHEEZING (Patient not taking: Reported on 02/24/2019)  . [DISCONTINUED] HYDROcodone-acetaminophen (NORCO/VICODIN) 5-325 MG tablet Take 1 tablet by mouth every 4 (four) hours as needed.  . [DISCONTINUED] metFORMIN (GLUCOPHAGE) 500 MG tablet Take 500 mg by mouth 2 (two) times daily.  . [DISCONTINUED] metoprolol tartrate (LOPRESSOR) 50 MG tablet Take 50 mg by mouth 2 (two) times daily.  . [DISCONTINUED] rivaroxaban (XARELTO) 20 MG TABS tablet Take 1 tablet (20 mg total) by mouth daily.  . [DISCONTINUED] spironolactone (ALDACTONE) 25 MG tablet Take 12.5 mg by mouth 2 (two) times daily.   No facility-administered encounter medications on file as of 02/24/2019.    ALLERGIES:  No Known Allergies   PHYSICAL EXAM:  ECOG Performance status: 1  Vitals:   02/24/19 1207  BP: 124/74  Pulse: 81  Resp: 16  Temp: (!) 97.1 F (36.2 C)   SpO2: 95%   Filed Weights   02/24/19 1207  Weight: 264 lb (119.7 kg)    Physical Exam Vitals reviewed.  Constitutional:      Appearance: Normal appearance.  Cardiovascular:     Rate and Rhythm: Normal rate and regular rhythm.     Heart sounds: Normal heart sounds.  Pulmonary:     Effort: Pulmonary effort is normal.     Comments: Crepitations in the left lower base heard. Abdominal:     General: There is no distension.     Palpations: Abdomen is soft. There is no mass.  Lymphadenopathy:     Cervical: No cervical adenopathy.  Skin:    General: Skin is warm.  Neurological:     General: No focal deficit present.     Mental Status: He is alert and oriented to person, place, and time.  Psychiatric:        Mood and Affect: Mood normal.        Behavior: Behavior normal.      LABORATORY DATA:  I have reviewed the labs as listed.  CBC    Component Value Date/Time   WBC 9.0 10/16/2017 0944   RBC 3.75 (L) 10/16/2017 0944   HGB 12.1 (L) 10/16/2017 0944   HCT 38.7 (L) 10/16/2017 0944   PLT 164 10/16/2017 0944   MCV 103.2 (H) 10/16/2017 0944   MCH 32.3 10/16/2017 0944   MCHC 31.3 10/16/2017 0944   RDW 14.5 10/16/2017 0944   LYMPHSABS 0.5 (L) 10/16/2017 0944   MONOABS 1.1 (H) 10/16/2017 0944   EOSABS 0.1 10/16/2017 0944   BASOSABS 0.0 10/16/2017 0944   CMP Latest Ref Rng &  Units 10/16/2017 06/03/2017 05/30/2017  Glucose 70 - 99 mg/dL 233(H) 144(H) 158(H)  BUN 8 - 23 mg/dL 29(H) 15 17  Creatinine 0.Herrera - 1.24 mg/dL 1.24 1.09 1.19  Sodium 135 - 145 mmol/L 138 140 139  Potassium 3.5 - 5.1 mmol/L 3.7 4.1 3.8  Chloride 98 - 111 mmol/L 102 102 101  CO2 22 - 32 mmol/L 25 27 26   Calcium 8.9 - 10.3 mg/dL 8.5(L) 9.2 9.1  Total Protein 6.5 - 8.1 g/dL 6.5 - -  Total Bilirubin 0.3 - 1.2 mg/dL 0.6 - -  Alkaline Phos 38 - 126 U/L 52 - -  AST 15 - 41 U/L 26 - -  ALT 0 - 44 U/L 44 - -       DIAGNOSTIC IMAGING:  I have independently reviewed the images of his PET scan and discussed  with patient.     ASSESSMENT & PLAN:   Small cell lung cancer ([place]) 1.  Recurrent small cell lung cancer: -He was treated for limited stage small cell lung cancer with chemoradiation therapy from April 16, 2016 through 06/21/2016, status post prophylactic cranial irradiation. -Last seen by me on 07/10/2017.  He was lost to follow-up after that. -He was seen by Dr.Yanagihara in [place].  A PET CT scan was ordered. -I have reviewed PET scan from 01/26/2019.  There is a new left lower lobe cavitary lesion.  Right lung nodules have increased in size and activity.  No thoracic adenopathy. -I have talked to [place] for the scan.  Unfortunately patient ended up in the hospital at [facility] and was treated for cavitary lesion in the left lung. -He is currently on antibiotics with amoxicillin.  I have reviewed records from [facility].  No bronchoscopy or biopsy was done. -He will be completing antibiotics in the next 7 to 10 days. -I have recommended repeating a CT scan of the chest in 2 weeks. -I will compare it with the prior PET CT scan to see if the left lower lobe cavitary lesion has resolved.  If the right lung masses are still present, we will consider biopsy. -I will also consider MRI of the brain. -I will see him after the CT scan.  He reports improvement in the breathing since he was started on antibiotics.      Orders placed this encounter:  Orders Placed This Encounter  Procedures  . CT Chest W Contrast      Derek Jack, MD [place] [phone]

## 2019-02-24 NOTE — Patient Instructions (Addendum)
[place] at [facility] Discharge Instructions  You were seen today by Dr. Delton Coombes. He went over your recent lab results. He will repeat a CT scan in 2 weeks for further evaluation. He will see you back after your scan for follow up.   Thank you for choosing [place] at [facility] to provide your oncology and hematology care.  To afford each patient quality time with our provider, please arrive at least 15 minutes before your scheduled appointment time.   If you have a lab appointment with the [facility] please come in thru the  Main Entrance and check in at the main information desk  You need to re-schedule your appointment should you arrive 10 or more minutes late.  We strive to give you quality time with our providers, and arriving late affects you and other patients whose appointments are after yours.  Also, if you no show three or more times for appointments you may be dismissed from the clinic at the providers discretion.     Again, thank you for choosing [facility].  Our hope is that these requests will decrease the amount of time that you wait before being seen by our physicians.       _____________________________________________________________  Should you have questions after your visit to [facility], please contact our office at [phone] between the hours of 8:00 a.m. and 4:30 p.m.  Voicemails left after 4:00 p.m. will not be returned until the following business day.  For prescription refill requests, have your pharmacy contact our office and allow 72 hours.    Cancer Center Support Programs:   > Cancer Support Group  2nd Tuesday of the month 1pm-2pm, Journey Room

## 2019-02-28 NOTE — Assessment & Plan Note (Signed)
1.  Recurrent small cell lung cancer: -He was treated for limited stage small cell lung cancer with chemoradiation therapy from April 16, 2016 through 06/21/2016, status post prophylactic cranial irradiation. -Last seen by me on 07/10/2017.  He was lost to follow-up after that. -He was seen by Dr.Yanagihara in [place].  A PET CT scan was ordered. -I have reviewed PET scan from 01/26/2019.  There is a new left lower lobe cavitary lesion.  Right lung nodules have increased in size and activity.  No thoracic adenopathy. -I have talked to [place] for the scan.  Unfortunately patient ended up in the hospital at [facility] and was treated for cavitary lesion in the left lung. -He is currently on antibiotics with amoxicillin.  I have reviewed records from [facility].  No bronchoscopy or biopsy was done. -He will be completing antibiotics in the next 7 to 10 days. -I have recommended repeating a CT scan of the chest in 2 weeks. -I will compare it with the prior PET CT scan to see if the left lower lobe cavitary lesion has resolved.  If the right lung masses are still present, we will consider biopsy. -I will also consider MRI of the brain. -I will see him after the CT scan.  He reports improvement in the breathing since he was started on antibiotics.

## 2019-03-09 ENCOUNTER — Telehealth: Payer: Self-pay

## 2019-03-09 IMAGING — DX DG CHEST 2V
2 series · 2 of 2 positions shown · non-contrast
Comparison: 04/28/2017

CLINICAL DATA: Shortness of breath, former smoker, COPD,
hypertension, atrial fibrillation/flutter, small cell lung cancer

EXAM:
CHEST - 2 VIEW

[chest pa]
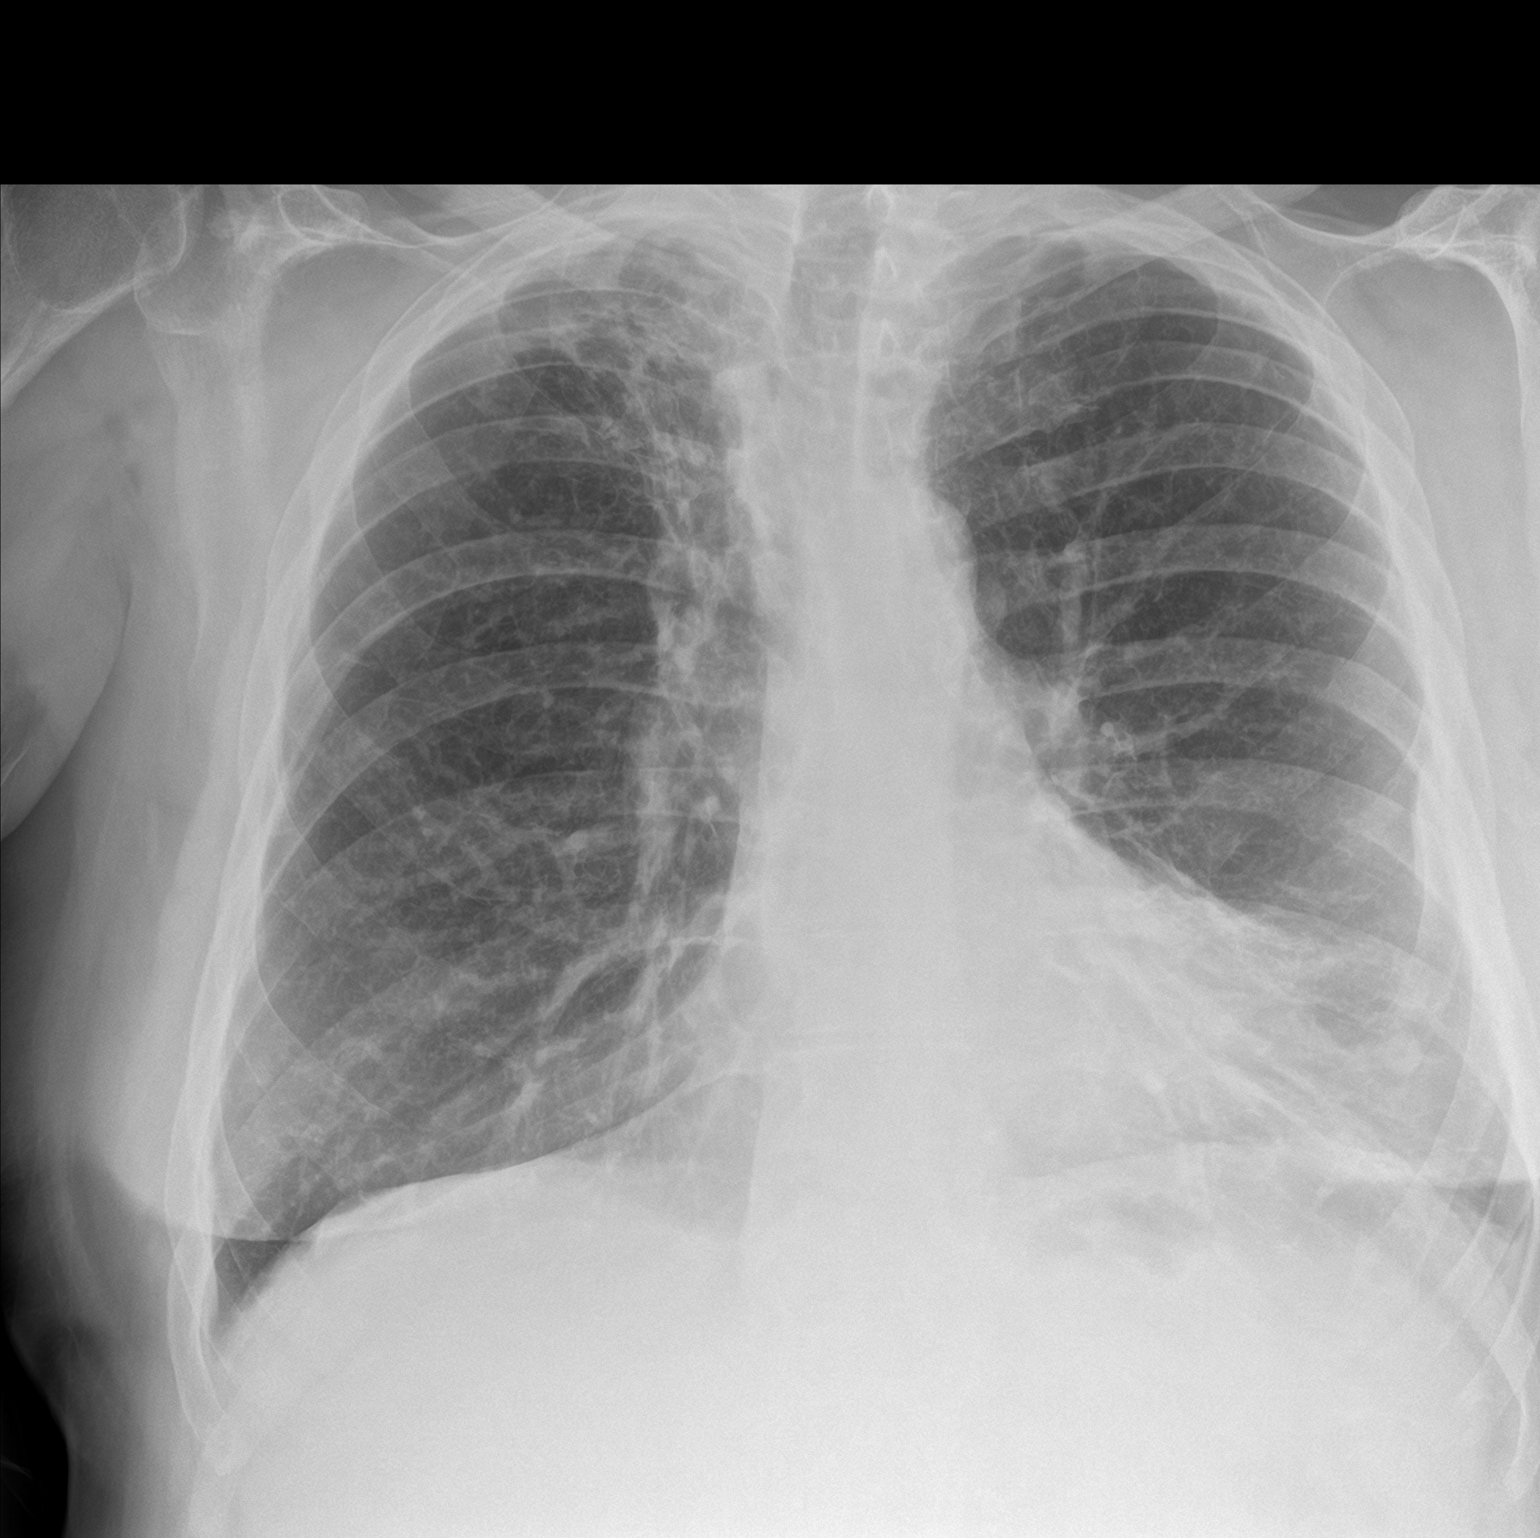

[chest lat]
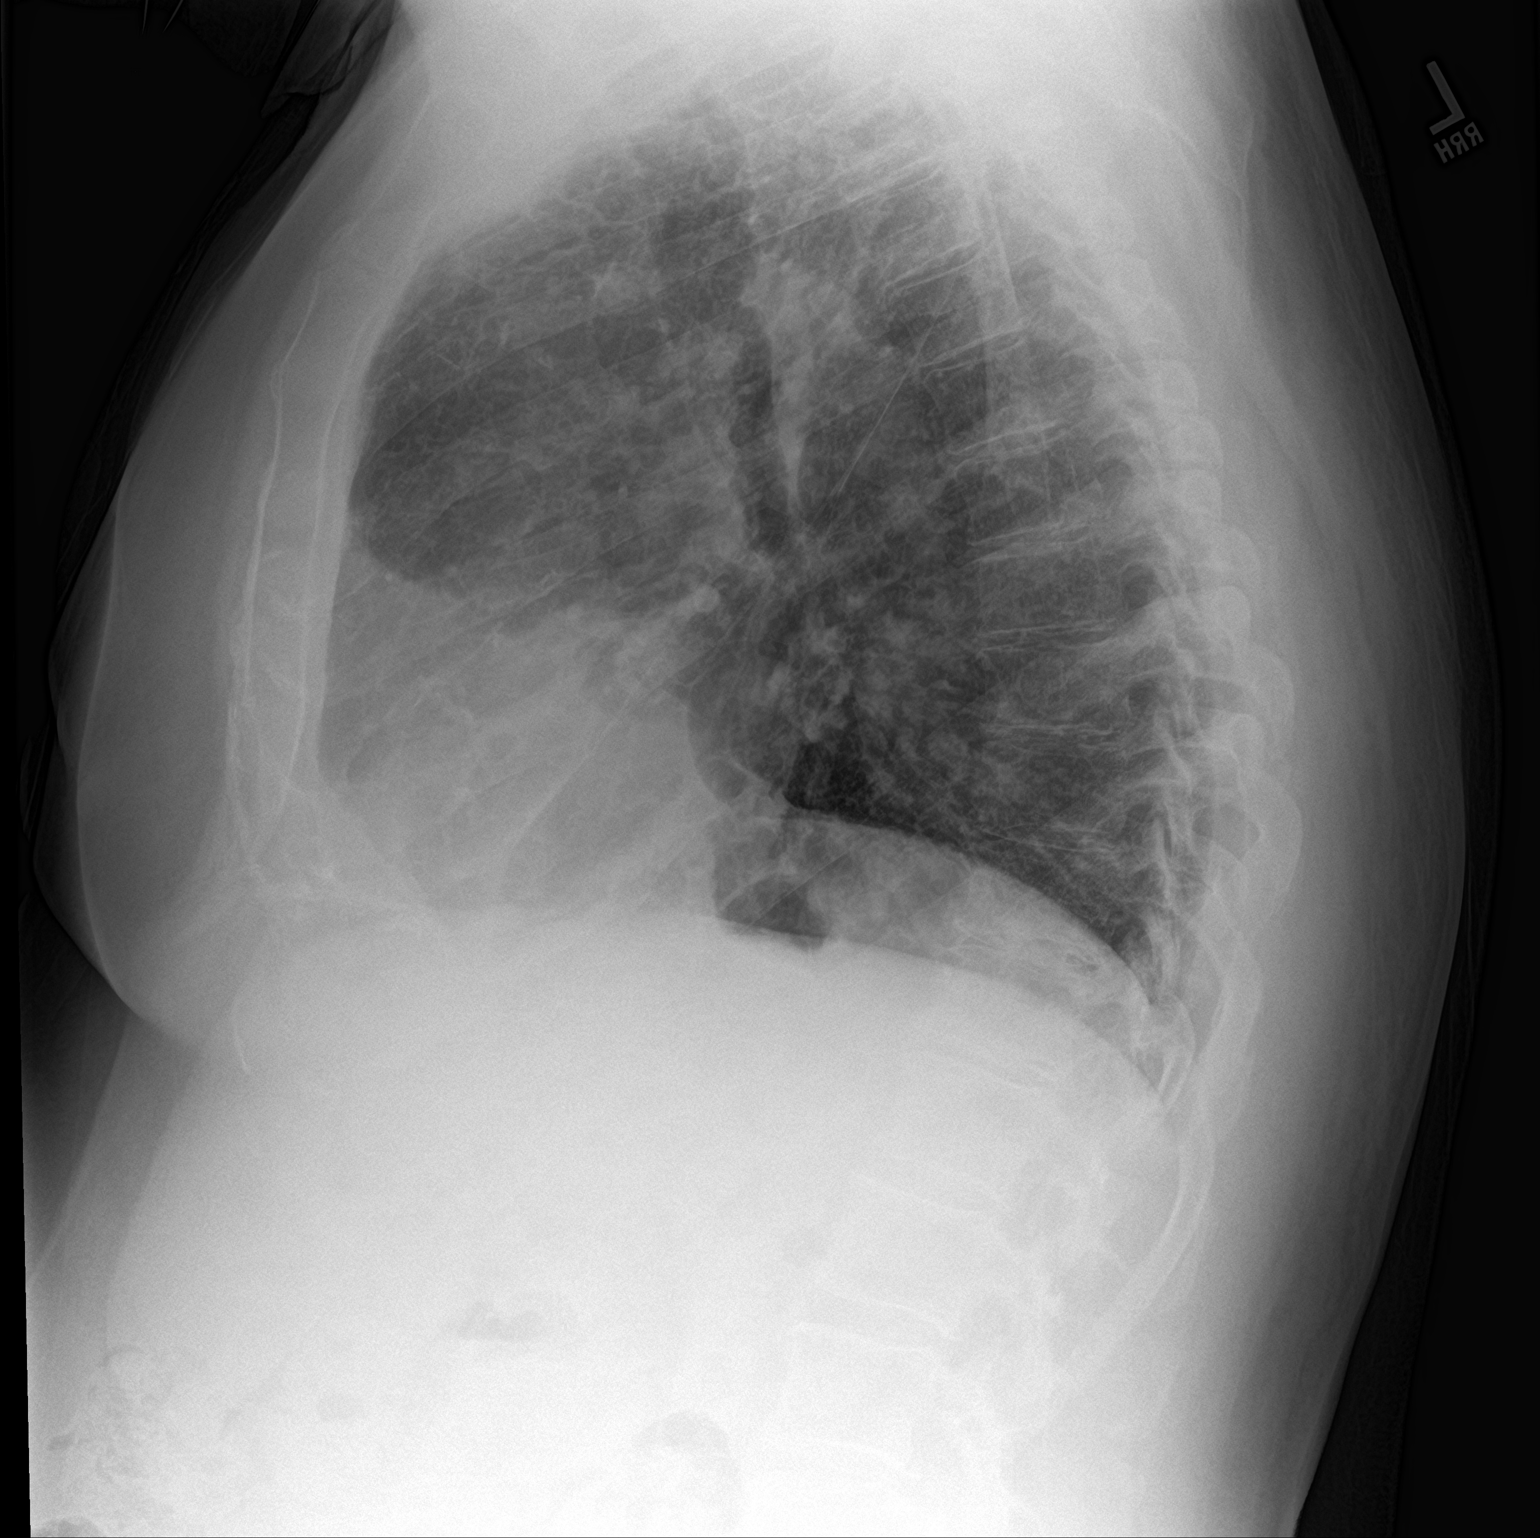

[2 of 2 positions shown; findings below may reference images not displayed]

FINDINGS: Normal heart size, mediastinal contours and pulmonary vascularity.

Scarring in RIGHT upper lobe and LEFT lung base stable.

Probable LEFT nipple shadow not seen on multiple recent exams or
recent CT.

Central peribronchial thickening consistent with bronchitis.

No acute infiltrate, pleural effusion or pneumothorax.

Bones unremarkable.
IMPRESSION: Bronchitic changes with scarring in RIGHT upper lobe and at LEFT
base.

Probable LEFT nipple shadow.

## 2019-03-09 NOTE — Telephone Encounter (Signed)
I reviewed the chart, actually the telephone note is viewable in EPIC.  He apparently had cultures that grew out atypical Mycobacterium during hospital stay.  He was told to follow-up with a Pulmonologist (which I was misidentified as being).  Called him back and see if he would like for [name] to help him get referred to [facility].

## 2019-03-09 NOTE — Telephone Encounter (Signed)
Patient notified and verbalized understanding.  Stated that he has appointment with his pmd tomorrow & they are going to assist him with establishing with pulmonology.

## 2019-03-09 NOTE — Telephone Encounter (Signed)
Message fwd to provider for review.

## 2019-03-09 NOTE — Telephone Encounter (Signed)
Will forward to [facility] office.

## 2019-03-09 NOTE — Telephone Encounter (Signed)
03-09-19/Rec'd call from Pt stating he was recently discharged from [facility]. Pt stated he had a sputum test; [place]. Informed Pt that the sputum test results indicated a slow moving infection and he was to notify his cardiologist.  Please call [phone]  Thanks renee

## 2019-03-11 ENCOUNTER — Other Ambulatory Visit: Payer: Self-pay

## 2019-03-11 ENCOUNTER — Ambulatory Visit (HOSPITAL_COMMUNITY)
Admission: RE | Admit: 2019-03-11 | Discharge: 2019-03-11 | Disposition: A | Discharge status: Home / Self Care | Payer: Medicare Other | Source: Ambulatory Visit | Attending: Hematology | Admitting: Hematology

## 2019-03-11 DIAGNOSIS — J852 Abscess of lung without pneumonia: Secondary | ICD-10-CM | POA: Diagnosis not present

## 2019-03-11 LAB — POCT I-STAT CREATININE: Creatinine, Ser: 1 mg/dL (ref 0.61–1.24)

## 2019-03-11 MED ORDER — IOHEXOL 300 MG/ML  SOLN
75.0000 mL | Freq: Once | INTRAMUSCULAR | Status: AC | PRN
  Administered 2019-03-11: 13:00:00 75 mL via INTRAVENOUS

## 2019-03-12 ENCOUNTER — Inpatient Hospital Stay (HOSPITAL_BASED_OUTPATIENT_CLINIC_OR_DEPARTMENT_OTHER): Payer: Medicare Other | Admitting: Hematology

## 2019-03-12 DIAGNOSIS — C3491 Malignant neoplasm of unspecified part of right bronchus or lung: Secondary | ICD-10-CM

## 2019-03-12 NOTE — Progress Notes (Signed)
Virtual Visit via Telephone Note  I connected with Chase Herrera on 03/12/19 at  3:20 PM EST by telephone and verified that I am speaking with the correct person using two identifiers.   I discussed the limitations, risks, security and privacy concerns of performing an evaluation and management service by telephone and the availability of in person appointments. I also discussed with the patient that there may be a patient responsible charge related to this service. The patient expressed understanding and agreed to proceed.   History of Present Illness: He was treated for limited stage small cell lung cancer of the right upper lobe of the lung with chemoradiation therapy from 04/16/2016 through 06/21/2016 and underwent a prophylactic cranial irradiation.    Observations/Objective: He reports that he still has some cough but clear expectoration.  His breathing is better.  He still continuing Augmentin given by his primary doctor.  Denies any fevers or chills.  No chest pains reported.  No headaches or vision changes.  Assessment and Plan:  1.  Recurrent small cell lung cancer: -PET scan on 01/26/2019 showed left lower lobe cavitary lesion.  He was treated with antibiotics.  He is still taking some Augmentin.  There are multiple right upper lobe lung nodules which have increased in size and activity.  No thoracic adenopathy. -I reviewed results of the CT scan from 2 days ago of the chest.  I compared it personally with the PET scan from January.  I have also reviewed PET scan from 2017 at the time of diagnosis. -He has a right upper lobe lung nodule measuring 2.5 cm which is increasing in size.  This was the cancer that was originally treated in 2017.  He also had multiple subpleural lung nodules on the right lung.  This is highly suggestive of recurrence.  Left lower lobe lung abscess/inflammation/infection has improved. -I have recommended biopsy of the right lung lesions.  We will reach out to IR  for CT-guided biopsy. -I will see him back after the biopsy to review results and further treatment plan.   Follow Up Instructions: Follow-up after the biopsy.   I discussed the assessment and treatment plan with the patient. The patient was provided an opportunity to ask questions and all were answered. The patient agreed with the plan and demonstrated an understanding of the instructions.   The patient was advised to call back or seek an in-person evaluation if the symptoms worsen or if the condition fails to improve as anticipated.  I provided 12 minutes of non-face-to-face time during this encounter.   Derek Jack, MD

## 2019-03-13 ENCOUNTER — Other Ambulatory Visit (HOSPITAL_COMMUNITY): Payer: Self-pay | Admitting: *Deleted

## 2019-03-13 ENCOUNTER — Encounter (HOSPITAL_COMMUNITY): Payer: Self-pay | Admitting: Radiology

## 2019-03-13 DIAGNOSIS — C3491 Malignant neoplasm of unspecified part of right bronchus or lung: Secondary | ICD-10-CM

## 2019-03-13 NOTE — Progress Notes (Signed)
Regan Rakers. Tyvion Edmondson" Male, 69 y.o., 19-Oct-1950 MRN:  [id number] Phone:  [phone] Jerilynn Mages) PCP:  [name], NP Coverage:  [facility] 07/23/2019 at 1:40 PM  RE: CT Biopsy Received: Today Message Contents  Derek Jack, MD  Garth Bigness D  He is on it for A. fib. So okay to hold it.       Previous Messages   ----- Message -----  From: Garth Bigness D  Sent: 03/13/2019  2:47 PM EST  To: Derek Jack, MD  Subject: CT Biopsy                     Patient is on Eliquis and we will need to hold it 2 days prior to his biopsy. I just need to know if it is okay to hold it. I do not see in epic who has him on Eliquis.

## 2019-03-16 ENCOUNTER — Encounter (HOSPITAL_COMMUNITY): Payer: Self-pay | Admitting: Radiology

## 2019-03-16 NOTE — Progress Notes (Signed)
Chase Herrera. Chase Herrera" Male, 69 y.o., 28-Apr-1950 MRN:  [id number] Phone:  [phone] Jerilynn Mages) PCP:  [name], NP Coverage:  [facility] 07/23/2019 at 1:40 PM  RE: CT Biopsy Received: 3 days ago Message Contents  Arne Cleveland, MD  Jillyn Hidden  Ok   CT core periph RUL apical enlarging PET+ nodule   DDH       Previous Messages   ----- Message -----  From: Garth Bigness D  Sent: 03/13/2019  2:44 PM EST  To: [facility] Procedure Requests  Subject: CT Biopsy                     Procedure:  CT Biopsy   Reason: Small cell carcinoma of right lung, lung cancer   History: NM PET, CT in computer   Provider: Derek Jack   Provider Contact: [phone]

## 2019-03-19 ENCOUNTER — Other Ambulatory Visit: Payer: Self-pay

## 2019-03-19 ENCOUNTER — Ambulatory Visit (HOSPITAL_COMMUNITY)
Admission: RE | Admit: 2019-03-19 | Discharge: 2019-03-19 | Disposition: A | Discharge status: Home / Self Care | Payer: Medicare Other | Source: Ambulatory Visit | Attending: Hematology | Admitting: Hematology

## 2019-03-19 DIAGNOSIS — Z20822 Contact with and (suspected) exposure to covid-19: Secondary | ICD-10-CM | POA: Insufficient documentation

## 2019-03-19 DIAGNOSIS — Z01812 Encounter for preprocedural laboratory examination: Secondary | ICD-10-CM | POA: Diagnosis present

## 2019-03-19 LAB — SARS CORONAVIRUS 2 (TAT 6-24 HRS): SARS Coronavirus 2: NEGATIVE

## 2019-03-20 ENCOUNTER — Other Ambulatory Visit: Payer: Self-pay | Admitting: Radiology

## 2019-03-23 ENCOUNTER — Other Ambulatory Visit: Payer: Self-pay

## 2019-03-23 ENCOUNTER — Ambulatory Visit (HOSPITAL_COMMUNITY)
Admission: RE | Admit: 2019-03-23 | Discharge: 2019-03-23 | Disposition: A | Discharge status: Home / Self Care | Payer: Medicare Other | Source: Ambulatory Visit | Attending: Diagnostic Radiology | Admitting: Diagnostic Radiology

## 2019-03-23 ENCOUNTER — Ambulatory Visit (HOSPITAL_COMMUNITY): Admission: RE | Admit: 2019-03-23 | Payer: Medicare Other | Source: Ambulatory Visit

## 2019-03-23 ENCOUNTER — Ambulatory Visit (HOSPITAL_COMMUNITY)
Admission: RE | Admit: 2019-03-23 | Discharge: 2019-03-23 | Disposition: A | Discharge status: Home / Self Care | Payer: Medicare Other | Source: Ambulatory Visit | Attending: Hematology | Admitting: Hematology

## 2019-03-23 ENCOUNTER — Encounter (HOSPITAL_COMMUNITY): Payer: Self-pay

## 2019-03-23 DIAGNOSIS — Z6834 Body mass index (BMI) 34.0-34.9, adult: Secondary | ICD-10-CM | POA: Insufficient documentation

## 2019-03-23 DIAGNOSIS — Z7901 Long term (current) use of anticoagulants: Secondary | ICD-10-CM | POA: Diagnosis not present

## 2019-03-23 DIAGNOSIS — C3491 Malignant neoplasm of unspecified part of right bronchus or lung: Secondary | ICD-10-CM

## 2019-03-23 DIAGNOSIS — I429 Cardiomyopathy, unspecified: Secondary | ICD-10-CM | POA: Insufficient documentation

## 2019-03-23 DIAGNOSIS — Z9221 Personal history of antineoplastic chemotherapy: Secondary | ICD-10-CM | POA: Insufficient documentation

## 2019-03-23 DIAGNOSIS — I700 Atherosclerosis of aorta: Secondary | ICD-10-CM | POA: Diagnosis not present

## 2019-03-23 DIAGNOSIS — E1136 Type 2 diabetes mellitus with diabetic cataract: Secondary | ICD-10-CM | POA: Insufficient documentation

## 2019-03-23 DIAGNOSIS — M1990 Unspecified osteoarthritis, unspecified site: Secondary | ICD-10-CM | POA: Insufficient documentation

## 2019-03-23 DIAGNOSIS — Z79899 Other long term (current) drug therapy: Secondary | ICD-10-CM | POA: Insufficient documentation

## 2019-03-23 DIAGNOSIS — Z87891 Personal history of nicotine dependence: Secondary | ICD-10-CM | POA: Diagnosis not present

## 2019-03-23 DIAGNOSIS — I10 Essential (primary) hypertension: Secondary | ICD-10-CM | POA: Diagnosis not present

## 2019-03-23 DIAGNOSIS — F329 Major depressive disorder, single episode, unspecified: Secondary | ICD-10-CM | POA: Diagnosis not present

## 2019-03-23 DIAGNOSIS — R220 Localized swelling, mass and lump, head: Secondary | ICD-10-CM | POA: Diagnosis not present

## 2019-03-23 DIAGNOSIS — K760 Fatty (change of) liver, not elsewhere classified: Secondary | ICD-10-CM | POA: Insufficient documentation

## 2019-03-23 DIAGNOSIS — Z794 Long term (current) use of insulin: Secondary | ICD-10-CM | POA: Diagnosis not present

## 2019-03-23 DIAGNOSIS — Z85118 Personal history of other malignant neoplasm of bronchus and lung: Secondary | ICD-10-CM | POA: Insufficient documentation

## 2019-03-23 DIAGNOSIS — E669 Obesity, unspecified: Secondary | ICD-10-CM | POA: Insufficient documentation

## 2019-03-23 DIAGNOSIS — Z9889 Other specified postprocedural states: Secondary | ICD-10-CM

## 2019-03-23 DIAGNOSIS — J439 Emphysema, unspecified: Secondary | ICD-10-CM | POA: Diagnosis not present

## 2019-03-23 DIAGNOSIS — I4891 Unspecified atrial fibrillation: Secondary | ICD-10-CM | POA: Diagnosis not present

## 2019-03-23 LAB — CBC WITH DIFFERENTIAL/PLATELET
Abs Immature Granulocytes: 0.07 10*3/uL (ref 0.00–0.07)
Basophils Absolute: 0 10*3/uL (ref 0.0–0.1)
Basophils Relative: 1 %
Eosinophils Absolute: 0.1 10*3/uL (ref 0.0–0.5)
Eosinophils Relative: 2 %
HCT: 43.6 % (ref 39.0–52.0)
Hemoglobin: 13.7 g/dL (ref 13.0–17.0)
Immature Granulocytes: 1 %
Lymphocytes Relative: 12 %
Lymphs Abs: 0.8 10*3/uL (ref 0.7–4.0)
MCH: 30.9 pg (ref 26.0–34.0)
MCHC: 31.4 g/dL (ref 30.0–36.0)
MCV: 98.4 fL (ref 80.0–100.0)
Monocytes Absolute: 0.7 10*3/uL (ref 0.1–1.0)
Monocytes Relative: 11 %
Neutro Abs: 4.7 10*3/uL (ref 1.7–7.7)
Neutrophils Relative %: 73 %
Platelets: 272 10*3/uL (ref 150–400)
RBC: 4.43 MIL/uL (ref 4.22–5.81)
RDW: 13.8 % (ref 11.5–15.5)
WBC: 6.3 10*3/uL (ref 4.0–10.5)
nRBC: 0 % (ref 0.0–0.2)

## 2019-03-23 LAB — BASIC METABOLIC PANEL
Anion gap: 13 (ref 5–15)
BUN: 22 mg/dL (ref 8–23)
CO2: 26 mmol/L (ref 22–32)
Calcium: 9.8 mg/dL (ref 8.9–10.3)
Chloride: 98 mmol/L (ref 98–111)
Creatinine, Ser: 1.32 mg/dL — ABNORMAL HIGH (ref 0.61–1.24)
GFR calc Af Amer: >60 mL/min (ref >60)
GFR calc non Af Amer: 56 mL/min — ABNORMAL LOW (ref >60)
Glucose, Bld: 346 mg/dL — ABNORMAL HIGH (ref 70–99)
Potassium: 4.2 mmol/L (ref 3.5–5.1)
Sodium: 137 mmol/L (ref 135–145)

## 2019-03-23 LAB — GLUCOSE, CAPILLARY
Glucose-Capillary: 325 mg/dL — ABNORMAL HIGH (ref 70–99)
Glucose-Capillary: 266 mg/dL — ABNORMAL HIGH (ref 70–99)

## 2019-03-23 LAB — PROTIME-INR
INR: 1 (ref 0.8–1.2)
Prothrombin Time: 13 seconds (ref 11.4–15.2)

## 2019-03-23 MED ORDER — SODIUM CHLORIDE 0.9 % IV SOLN: INTRAVENOUS | Status: DC

## 2019-03-23 MED ORDER — FENTANYL CITRATE (PF) 100 MCG/2ML IJ SOLN
INTRAMUSCULAR | Status: AC | PRN
  Administered 2019-03-23: 25 ug via INTRAVENOUS

## 2019-03-23 MED ORDER — LIDOCAINE HCL 1 % IJ SOLN
INTRAMUSCULAR | Status: AC
  Filled 2019-03-23: qty 20

## 2019-03-23 MED ORDER — FENTANYL CITRATE (PF) 100 MCG/2ML IJ SOLN
INTRAMUSCULAR | Status: AC
  Filled 2019-03-23: qty 2

## 2019-03-23 MED ORDER — MIDAZOLAM HCL 2 MG/2ML IJ SOLN
INTRAMUSCULAR | Status: AC
  Filled 2019-03-23: qty 2

## 2019-03-23 MED ORDER — MIDAZOLAM HCL 2 MG/2ML IJ SOLN
INTRAMUSCULAR | Status: AC | PRN
  Administered 2019-03-23: 0.5 mg via INTRAVENOUS

## 2019-03-23 NOTE — Progress Notes (Signed)
Spoke with patient's sister, Juliann Pulse.  She will come pick the patient up after his procedure today and will stay the night.   Brynda Greathouse, MS RD PA-C 10:49 AM

## 2019-03-23 NOTE — Procedures (Signed)
Interventional Radiology Procedure:   Indications: Concern for recurrence of small cell lung cancer  Procedure: CT guided right lung lesion biopsy  Findings: 2 cores from right apical lesion  Complications: None     EBL: less than 10 ml  Plan: Bedrest 2 hours, CXR in 1 hour.    Chase Herrera R. Anselm Pancoast, MD  Pager: [phone]

## 2019-03-23 NOTE — Discharge Instructions (Addendum)
RESTART ELIQUIS TOMORROW 3/2 EVENING  Lung Biopsy, Care After This sheet gives you information about how to care for yourself after your procedure. Your health care provider may also give you more specific instructions depending on the type of biopsy you had. If you have problems or questions, contact your health care provider. What can I expect after the procedure? After the procedure, it is common to have:  A cough.  A sore throat.  Pain where a needle, bronchoscope, or incision was used to collect a biopsy sample (biopsy site). Follow these instructions at home: Medicines  Take over-the-counter and prescription medicines only as told by your health care provider.  Do not drink alcohol if your health care provider tells you not to drink.  Ask your health care provider if the medicine prescribed to you: ? Requires you to avoid driving or using heavy machinery. ? Can cause constipation. You may need to take these actions to prevent or treat constipation:  Drink enough fluid to keep your urine pale yellow.  Take over-the-counter or prescription medicines.  Eat foods that are high in fiber, such as beans, whole grains, and fresh fruits and vegetables.  Limit foods that are high in fat and processed sugars, such as fried or sweet foods.  Do not drive for 24 hours if you were given a sedative. Biopsy site care   Follow instructions from your health care provider about how to take care of your biopsy site. Make sure you: ? Wash your hands with soap and water before and after you change your bandage (dressing). If soap and water are not available, use hand sanitizer. ? Change your dressing as told by your health care provider. ? Leave stitches (sutures), skin glue, or adhesive strips in place. These skin closures may need to stay in place for 2 weeks or longer. If adhesive strip edges start to loosen and curl up, you may trim the loose edges. Do not remove adhesive strips completely  unless your health care provider tells you to do that.  Do not take baths, swim, or use a hot tub until your health care provider approves. Ask your health care provider if you may take showers. You may only be allowed to take sponge baths.  Check your biopsy site every day for signs of infection. Check for: ? Redness, swelling, or more pain. ? Fluid or blood. ? Warmth. ? Pus or a bad smell. General instructions  Return to your normal activities as told by your health care provider. Ask your health care provider what activities are safe for you.  It is up to you to get the results of your procedure. Ask your health care provider, or the department that is doing the procedure, when your results will be ready.  Keep all follow-up visits as told by your health care provider. This is important. Contact a health care provider if:  You have a fever.  You have redness, swelling, or more pain around your biopsy site.  You have fluid or blood coming from your biopsy site.  Your biopsy site feels warm to the touch.  You have pus or a bad smell coming from your biopsy site.  You have pain that does not get better with medicine. Get help right away if:  You cough up blood.  You have trouble breathing.  You have chest pain.  You lose consciousness. Summary  After the procedure, it is common to have a sore throat and a cough.  Return to your  normal activities as told by your health care provider. Ask your health care provider what activities are safe for you.  Take over-the-counter and prescription medicines only as told by your health care provider.  Report any unusual symptoms to your health care provider. This information is not intended to replace advice given to you by your health care provider. Make sure you discuss any questions you have with your health care provider. Document Revised: 02/12/2018 Document Reviewed: 02/07/2016 Elsevier Patient Education  [place]. Moderate Conscious Sedation, Adult Sedation is the use of medicines to promote relaxation and relieve discomfort and anxiety. Moderate conscious sedation is a type of sedation. Under moderate conscious sedation, you are less alert than normal, but you are still able to respond to instructions, touch, or both. Moderate conscious sedation is used during short medical and dental procedures. It is milder than deep sedation, which is a type of sedation under which you cannot be easily woken up. It is also milder than general anesthesia, which is the use of medicines to make you unconscious. Moderate conscious sedation allows you to return to your regular activities sooner. Tell a health care provider about:  Any allergies you have.  All medicines you are taking, including vitamins, herbs, eye drops, creams, and over-the-counter medicines.  Use of steroids (by mouth or creams).  Any problems you or family members have had with sedatives and anesthetic medicines.  Any blood disorders you have.  Any surgeries you have had.  Any medical conditions you have, such as sleep apnea.  Whether you are pregnant or may be pregnant.  Any use of cigarettes, alcohol, marijuana, or street drugs. What are the risks? Generally, this is a safe procedure. However, problems may occur, including:  Getting too much medicine (oversedation).  Nausea.  Allergic reaction to medicines.  Trouble breathing. If this happens, a breathing tube may be used to help with breathing. It will be removed when you are awake and breathing on your own.  Heart trouble.  Lung trouble. What happens before the procedure? Staying hydrated Follow instructions from your health care provider about hydration, which may include:  Up to 2 hours before the procedure - you may continue to drink clear liquids, such as water, clear fruit juice, black coffee, and plain tea. Eating and drinking restrictions Follow instructions from  your health care provider about eating and drinking, which may include:  8 hours before the procedure - stop eating heavy meals or foods such as meat, fried foods, or fatty foods.  6 hours before the procedure - stop eating light meals or foods, such as toast or cereal.  6 hours before the procedure - stop drinking milk or drinks that contain milk.  2 hours before the procedure - stop drinking clear liquids. Medicine Ask your health care provider about:  Changing or stopping your regular medicines. This is especially important if you are taking diabetes medicines or blood thinners.  Taking medicines such as aspirin and ibuprofen. These medicines can thin your blood. Do not take these medicines before your procedure if your health care provider instructs you not to.  Tests and exams  You will have a physical exam.  You may have blood tests done to show: ? How well your kidneys and liver are working. ? How well your blood can clot. General instructions  Plan to have someone take you home from the hospital or clinic.  If you will be going home right after the procedure, plan to have  someone with you for 24 hours. What happens during the procedure?  An IV tube will be inserted into one of your veins.  Medicine to help you relax (sedative) will be given through the IV tube.  The medical or dental procedure will be performed. What happens after the procedure?  Your blood pressure, heart rate, breathing rate, and blood oxygen level will be monitored often until the medicines you were given have worn off.  Do not drive for 24 hours. This information is not intended to replace advice given to you by your health care provider. Make sure you discuss any questions you have with your health care provider. Document Revised: 12/21/2016 Document Reviewed: 04/30/2015 Elsevier Patient Education  2020 [employer].

## 2019-03-23 NOTE — H&P (Signed)
Chief Complaint: Patient was seen in consultation today for right upper lobe nodule biopsy at the request of Katragadda,Sreedhar  Referring Physician(s): Katragadda,Sreedhar  Supervising Physician: Markus Daft  Patient Status: [facility] - Out-pt  History of Present Illness: Chase Herrera is a 69 y.o. male   COPD Pt with Hx small cell right lung cancer 2017 Radiation/chemo 03/2015-05/2015 Follows with Dr Delton Coombes Denies SOB; cough; pain  Routine follow up PET 01/26/2019:  IMPRESSION: 1. Two hypermetabolic solid right pulmonary nodules, largest 1.6 cm in the peripheral right lower lobe, both enlarging over the past year, compatible with pulmonary metastases. 2. Hypermetabolism associated with nodular consolidation in the peripheral apical right upper lobe at the site of the original treated tumor, mildly increasing in size over the past year, suspicious for local tumor recurrence.  CT 03/11/19: IMPRESSION: 1. Stable to mild increase in size of subpleural lesion within the lateral right apex compared with 01/26/2019. 2. Subpleural nodules overlying lateral right upper lobe and right lower lobe demonstrate continued mild progression when compared with 02/25/2019 and 01/26/2019.  Dr Delton Coombes note 03/12/19: -He has a right upper lobe lung nodule measuring 2.5 cm which is increasing in size.  This was the cancer that was originally treated in 2017.  He also had multiple subpleural lung nodules on the right lung.  This is highly suggestive of recurrence.  Left lower lobe lung abscess/inflammation/infection has improved. -I have recommended biopsy of the right lung lesions.  We will reach out to IR for CT-guided biopsy.  Pt now scheduled for biopsy of RUL nodule LD Eliquis Fri 2/26  Past Medical History:  Diagnosis Date  . Alcohol abuse    Quit 08/23/11  . Arthritis   . Atrial fibrillation ([place])   . Atrial flutter ([place])    CTI ablation by Dr [name] 10/2011  . Bacteremia due to  Gram-positive bacteria 11/29/2016  . COPD (chronic obstructive pulmonary disease) ([place])   . Depression   . Essential hypertension   . History of cardiomyopathy    LVEF 25-30% 08/2011 with subsequent normalization  . History of kidney stones   . Obesity   . Prostate enlargement   . Small cell lung cancer ([place])    Right upper lobe - follows with [facility]  . Type 2 diabetes mellitus ([place])     Past Surgical History:  Procedure Laterality Date  . APPENDECTOMY    . ATRIAL ABLATION SURGERY  11/06/2011   CTI ablation for atrial flutter by Dr [name]  . ATRIAL FLUTTER ABLATION N/A 11/06/2011   Procedure: ATRIAL FLUTTER ABLATION;  Surgeon: Thompson Grayer, MD;  Location: [facility] CATH LAB;  Service: Cardiovascular;  Laterality: N/A;  . Cataracts    . COLONOSCOPY WITH PROPOFOL N/A 06/06/2017   Procedure: COLONOSCOPY WITH PROPOFOL;  Surgeon: [name], MD;  Location: [facility] ENDO SUITE;  Service: Endoscopy;  Laterality: N/A;  1:15pm  . IR GENERIC HISTORICAL  04/09/2016   IR US GUIDE VASC ACCESS RIGHT 04/09/2016 [name], DO [facility]-INTERV RAD  . IR GENERIC HISTORICAL  04/09/2016   IR FLUORO GUIDE PORT INSERTION RIGHT 04/09/2016 [name], DO [facility]-INTERV RAD  . PORT-A-CATH REMOVAL N/A 04/24/2017   Procedure: MINOR REMOVAL PORT-A-CATH;  Surgeon: [name], MD;  Location: [facility] ORS;  Service: General;  Laterality: N/A;  . TONSILLECTOMY      Allergies: Patient has no known allergies.  Medications: Prior to Admission medications   Medication Sig Start Date End Date Taking? Authorizing Provider  acetaminophen (TYLENOL) 500  MG tablet Take 1,000 mg by mouth every 6 (six) hours as needed for moderate pain or headache.   Yes [provider]  amoxicillin-clavulanate (AUGMENTIN) 875-125 MG tablet Take 1 tablet by mouth 2 (two) times daily.  03/10/19  Yes [provider]  apixaban (ELIQUIS) 5 MG TABS tablet Take 5 mg by mouth 2 (two) times daily.  02/13/19  Yes [provider]    Artificial Saliva (BIOTENE DRY MOUTH MOISTURIZING) SOLN Apply 1 Dose topically as needed (dry mouth).  02/10/19  Yes [provider]  atorvastatin (LIPITOR) 20 MG tablet Take 1 tablet (20 mg total) by mouth daily. 01/03/17  Yes Hagler, Apolonio Schneiders, MD  Carboxymethylcellul-Glycerin (LUBRICATING EYE DROPS OP) Place 1 drop into the left eye daily as needed (dry eyes).   Yes [provider]  diltiazem (DILACOR XR) 240 MG 24 hr capsule Take 240 mg by mouth daily.   Yes [provider]  dofetilide (TIKOSYN) 250 MCG capsule Take 1 capsule (250 mcg total) by mouth 2 (two) times daily. 09/15/18  Yes Satira Sark, MD  fluticasone [facility]) 50 MCG/ACT nasal spray Place 2 sprays into both nostrils daily. Patient taking differently: Place 2 sprays into both nostrils daily as needed for allergies.  03/06/17  Yes Hagler, Apolonio Schneiders, MD  Fluticasone-Umeclidin-Vilant (TRELEGY ELLIPTA) 100-62.5-25 MCG/INH AEPB Inhale 1 puff into the lungs daily.   Yes [provider]  furosemide (LASIX) 40 MG tablet TAKE 1 TABLET BY MOUTH EVERY DAY Patient taking differently: Take 40 mg by mouth daily.  06/07/17  Yes Hagler, Apolonio Schneiders, MD  gabapentin (NEURONTIN) 300 MG capsule Take 600 mg by mouth 4 (four) times daily.   Yes [provider]  guaifenesin (ROBITUSSIN) 100 MG/5ML syrup Take 200 mg by mouth at bedtime as needed for cough.   Yes [provider]  HYDROcodone-acetaminophen (NORCO/VICODIN) 5-325 MG tablet Take 1 tablet by mouth every 4 (four) hours as needed. Patient taking differently: Take 1 tablet by mouth every 4 (four) hours as needed for moderate pain.  01/18/19  Yes Isla Pence, MD  hydrOXYzine (VISTARIL) 25 MG capsule Take 25 mg by mouth every 8 (eight) hours.   Yes [provider]  insulin lispro (HUMALOG) 100 UNIT/ML injection Inject 2-20 Units into the skin 3 (three) times daily with meals. Sliding scale    Yes [provider]   ipratropium-albuterol (DUONEB) 0.5-2.5 (3) MG/3ML SOLN Take 3 mLs by nebulization every 4 (four) hours as needed (shortness of breath).    Yes [provider]  LANTUS SOLOSTAR 100 UNIT/ML Solostar Pen INJECT 20 UNITS UNDER THE SKIN EVERY MORNING AND INJECT 20 UNITS UNDER THE SKIN EVERY EVENING Patient taking differently: Inject 20 Units into the skin 2 (two) times daily.  05/02/17  Yes Hagler, Apolonio Schneiders, MD  levalbuterol Penne Lash) 0.63 MG/3ML nebulizer solution Inhale 3 mLs (0.63 mg total) into the lungs 4 (four) times daily. Patient taking differently: Inhale 0.63 mg into the lungs every 6 (six) hours as needed for wheezing or shortness of breath.  01/03/17  Yes Hagler, Apolonio Schneiders, MD  Lidocaine (SALONPAS PAIN RELIEVING EX) Apply 1 patch topically every 12 (twelve) hours.   Yes [provider]  metFORMIN (GLUCOPHAGE) 500 MG tablet Take 500 mg by mouth in the morning and at bedtime.   Yes [provider]  metoprolol succinate (TOPROL-XL) 100 MG 24 hr tablet Take 100 mg by mouth daily. 02/13/19  Yes [provider]  Multiple Vitamins-Minerals (COMPLETE MULTIVITAMIN/MINERAL PO) Take 1 tablet by mouth daily.  03/05/17  Yes [provider]  potassium chloride (KLOR-CON) 10 MEQ tablet Take 10 mEq by mouth 2 (two) times daily. 02/13/19  Yes [provider]  PROAIR HFA 108 (90 Base) MCG/ACT inhaler INHALE TWO PUFFS BY MOUTH EVERY 4 HOURS AS NEEDED FOR WHEEZING Patient taking differently: Inhale 2 puffs into the lungs every 4 (four) hours as needed for shortness of breath.  06/11/17  Yes Hagler, Apolonio Schneiders, MD  senna (SENOKOT) 8.6 MG tablet Take 2 tablets by mouth daily.    Yes [provider]  spironolactone (ALDACTONE) 25 MG tablet Take 12.5 mg by mouth 2 (two) times daily.  03/11/19  Yes [provider]  tamsulosin (FLOMAX) 0.4 MG CAPS capsule Take 1 capsule (0.4 mg total) by mouth 2 (two) times daily after a meal. 01/03/17  Yes Hagler, Apolonio Schneiders, MD   tiotropium (SPIRIVA HANDIHALER) 18 MCG inhalation capsule Place 1 capsule (18 mcg total) into inhaler and inhale daily. Patient taking differently: Place 18 mcg into inhaler and inhale daily as needed (shortness of breath).  03/06/17  Yes [name], MD  traZODone (DESYREL) 50 MG tablet Take 50 mg by mouth at bedtime. 04/04/17  Yes [provider]  [place] test strip check blood sugar TWICE DAILY 01/22/19   [provider]  AQUALANCE LANCETS 30G MISC USE TO check blood glucose twice daily 06/19/17   [provider]  cetirizine (ZYRTEC) 10 MG tablet Take 1 tablet (10 mg total) by mouth daily. Patient not taking: Reported on 03/18/2019 06/03/17   [name], MD  Lancet Devices (ADJUSTABLE LANCING DEVICE) MISC TO check blood glucose daily 01/06/19   [provider]  metFORMIN (GLUCOPHAGE) 1000 MG tablet Take 1 tablet (1,000 mg total) by mouth 2 (two) times daily with a meal. Patient not taking: Reported on 03/18/2019 06/04/17   [name], MD     Family History  Problem Relation Age of Onset  . Diabetes Sister   . Hypertension Mother   . Lung cancer Mother   . Hypertension Father   . Heart attack Father   . Hypertension Sister   . Hypertension Sister   . Hypertension Sister   . Pulmonary fibrosis Sister   . Breast cancer Cousin   . Colon cancer Neg Hx     Social History   Socioeconomic History  . Marital status: Single    Spouse name: Not on file  . Number of children: Not on file  . Years of education: Not on file  . Highest education level: Not on file  Occupational History  . Occupation: retired     Comment: Building services engineer  Tobacco Use  . Smoking status: Former Smoker    Packs/day: 0.50    Years: 47.00    Pack years: 23.50    Types: Cigarettes    Quit date: 05/30/2012    Years since quitting: 6.8  . Smokeless tobacco: Former Systems developer    Quit date: 04/10/2014  . Tobacco comment: encouraged to quit today 03/04/12  Substance  and Sexual Activity  . Alcohol use: No    Comment: quit 3 1/2 years ago  . Drug use: Not Currently    Comment: marajuana occasionally  . Sexual activity: Not Currently  Other Topics Concern  . Not on file  Social History Narrative   Lives in [place] alone.   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  Difficulty of Paying Living Expenses: Not on file  Food Insecurity:   . Worried About Charity fundraiser in the Last Year: Not on file  . Ran Out of Food in the Last Year: Not on file  Transportation Needs:   . Lack of Transportation (Medical): Not on file  . Lack of Transportation (Non-Medical): Not on file  Physical Activity:   . Days of Exercise per Week: Not on file  . Minutes of Exercise per Session: Not on file  Stress:   . Feeling of Stress : Not on file  Social Connections:   . Frequency of Communication with Friends and Family: Not on file  . Frequency of Social Gatherings with Friends and Family: Not on file  . Attends Religious Services: Not on file  . Active Member of Clubs or Organizations: Not on file  . Attends Archivist Meetings: Not on file  . Marital Status: Not on file    Review of Systems: A 12 point ROS discussed and pertinent positives are indicated in the HPI above.  All other systems are negative.  Review of Systems  Constitutional: Negative for activity change, fatigue and fever.  HENT: Negative for trouble swallowing.   Eyes: Negative for visual disturbance.  Respiratory: Negative for cough and shortness of breath.   Cardiovascular: Negative for chest pain.  Gastrointestinal: Negative for abdominal pain.  Musculoskeletal: Negative for back pain.  Psychiatric/Behavioral: Negative for behavioral problems and confusion.    Vital Signs: BP 110/70   Pulse 68   Temp 98 F (36.7 C) (Tympanic)   Resp 16   Ht 6\' 1"  (1.854 m)   Wt 259 lb (117.5  kg)   SpO2 96%   BMI 34.17 kg/m   Physical Exam Vitals reviewed.  Cardiovascular:     Rate and Rhythm: Normal rate and regular rhythm.     Heart sounds: Normal heart sounds.  Pulmonary:     Effort: Pulmonary effort is normal.     Breath sounds: Normal breath sounds.     Comments: Uses O2 always Abdominal:     Palpations: Abdomen is soft.  Musculoskeletal:        General: Normal range of motion.  Skin:    General: Skin is warm and dry.  Neurological:     Mental Status: He is alert and oriented to person, place, and time.  Psychiatric:        Behavior: Behavior normal.        Thought Content: Thought content normal.        Judgment: Judgment normal.     Imaging: CT Chest W Contrast  Result Date: 03/11/2019 CLINICAL DATA:  Right upper lobe lung cancer, follow-up EXAM: CT CHEST WITH CONTRAST TECHNIQUE: Multidetector CT imaging of the chest was performed during intravenous contrast administration. CONTRAST:  33mL OMNIPAQUE IOHEXOL 300 MG/ML  SOLN COMPARISON:  PET-CT 01/26/2019 FINDINGS: Cardiovascular: Normal heart size. Aortic atherosclerosis. Left main, lad, left circumflex and RCA coronary artery calcifications. Mediastinum/Nodes: Normal appearance of the thyroid gland. The trachea appears patent and is midline. Normal appearance of the esophagus. No mediastinal or hilar adenopathy identified. Lungs/Pleura: Moderate centrilobular and paraseptal emphysema. Paramediastinal radiation changes identified within the right upper lobe and extending into the right apex. Focal area of pleural thickening and subpleural consolidation with small pneumatocele is identified. This measures 2.4 cm, image 102/4. Previously 3.3 cm. Favor resolving inflammation/infection. Subpleural mass within the lateral right apex measures 2.5 x 2.1 cm, image 31/2. This is compared with  2.5 by 2.1 cm on 02/25/2019. On 01/26/2019 this measured 2.0 by 1.9 cm. Subpleural nodule overlying the posterolateral right upper lobe  measures 1.6 x 1.0 cm, image 45/2. By 0.8 cm. The subpleural lesion overlying the lateral right lower lobe measures 1.5 x 0.8 cm, image 79/2. This is compared with 1.4 x 0.9 cm. Upper Abdomen: Hepatic steatosis. No mass or adenopathy visualized. No acute findings. Musculoskeletal: No chest wall abnormality. No acute or significant osseous findings. The no aggressive lytic or sclerotic bone lesions identified. Chronic deformity involving left lateral ribs appear posttraumatic. IMPRESSION: 1. Stable to mild increase in size of subpleural lesion within the lateral right apex compared with 01/26/2019. 2. Subpleural nodules overlying lateral right upper lobe and right lower lobe demonstrate continued mild progression when compared with 02/25/2019 and 01/26/2019. 3. No new sites of disease. 4. Continued improvement in resolving infection/pulmonary abscess within the posteromedial left lower lobe. 5. Emphysema and aortic atherosclerosis. 6. Left main and 3 vessel coronary artery calcifications. 7. Hepatic steatosis. Aortic Atherosclerosis (ICD10-I70.0) and Emphysema (ICD10-J43.9). Electronically Signed   By: Kerby Moors M.D.   On: 03/11/2019 16:02    Labs:  CBC: No results for input(s): WBC, HGB, HCT, PLT in the last 8760 hours.  COAGS: No results for input(s): INR, APTT in the last 8760 hours.  BMP: Recent Labs    03/11/19 1247  CREATININE 1.00    LIVER FUNCTION TESTS: No results for input(s): BILITOT, AST, ALT, ALKPHOS, PROT, ALBUMIN in the last 8760 hours.  TUMOR MARKERS: No results for input(s): AFPTM, CEA, CA199, CHROMGRNA in the last 8760 hours.  Assessment and Plan:  COPD Hx Rt lung Ca 2017 Routine follow up revealing possible Rt lung cancer recurrence +PET Now scheduled for Rt upper lobe lung nodule biopsy Risks and benefits of CT guided lung nodule biopsy was discussed with the patient including, but not limited to bleeding, hemoptysis, respiratory failure requiring intubation,  infection, pneumothorax requiring chest tube placement, stroke from air embolism or even death.  All of the patient's questions were answered and the patient is agreeable to proceed.  Consent signed and in chart.   Thank you for this interesting consult.  I greatly enjoyed meeting Chase Herrera and look forward to participating in their care.  A copy of this report was sent to the requesting provider on this date.  Electronically Signed: Lavonia Drafts, PA-C 03/23/2019, 9:55 AM   I spent a total of  30 Minutes   in face to face in clinical consultation, greater than 50% of which was counseling/coordinating care for RUL nodule bx

## 2019-03-24 ENCOUNTER — Other Ambulatory Visit: Payer: Self-pay

## 2019-03-25 LAB — SURGICAL PATHOLOGY

## 2019-04-01 ENCOUNTER — Inpatient Hospital Stay (HOSPITAL_COMMUNITY): Payer: Medicare Other | Attending: Hematology | Admitting: Hematology

## 2019-04-01 ENCOUNTER — Encounter (HOSPITAL_COMMUNITY): Payer: Self-pay | Admitting: Hematology

## 2019-04-01 ENCOUNTER — Other Ambulatory Visit: Payer: Self-pay

## 2019-04-01 VITALS — BP 125/60 | HR 76 | Temp 97.1°F | Resp 18 | Wt 263.6 lb

## 2019-04-01 DIAGNOSIS — R0602 Shortness of breath: Secondary | ICD-10-CM | POA: Diagnosis not present

## 2019-04-01 DIAGNOSIS — K5900 Constipation, unspecified: Secondary | ICD-10-CM | POA: Insufficient documentation

## 2019-04-01 DIAGNOSIS — I4891 Unspecified atrial fibrillation: Secondary | ICD-10-CM | POA: Diagnosis not present

## 2019-04-01 DIAGNOSIS — J449 Chronic obstructive pulmonary disease, unspecified: Secondary | ICD-10-CM | POA: Insufficient documentation

## 2019-04-01 DIAGNOSIS — I700 Atherosclerosis of aorta: Secondary | ICD-10-CM | POA: Diagnosis not present

## 2019-04-01 DIAGNOSIS — I2510 Atherosclerotic heart disease of native coronary artery without angina pectoris: Secondary | ICD-10-CM | POA: Diagnosis not present

## 2019-04-01 DIAGNOSIS — Z87891 Personal history of nicotine dependence: Secondary | ICD-10-CM | POA: Insufficient documentation

## 2019-04-01 DIAGNOSIS — Z836 Family history of other diseases of the respiratory system: Secondary | ICD-10-CM | POA: Diagnosis not present

## 2019-04-01 DIAGNOSIS — M1990 Unspecified osteoarthritis, unspecified site: Secondary | ICD-10-CM | POA: Diagnosis not present

## 2019-04-01 DIAGNOSIS — Z79899 Other long term (current) drug therapy: Secondary | ICD-10-CM | POA: Diagnosis not present

## 2019-04-01 DIAGNOSIS — R200 Anesthesia of skin: Secondary | ICD-10-CM | POA: Insufficient documentation

## 2019-04-01 DIAGNOSIS — C3411 Malignant neoplasm of upper lobe, right bronchus or lung: Secondary | ICD-10-CM | POA: Insufficient documentation

## 2019-04-01 DIAGNOSIS — Z7901 Long term (current) use of anticoagulants: Secondary | ICD-10-CM | POA: Diagnosis not present

## 2019-04-01 DIAGNOSIS — K5730 Diverticulosis of large intestine without perforation or abscess without bleeding: Secondary | ICD-10-CM | POA: Insufficient documentation

## 2019-04-01 DIAGNOSIS — F40240 Claustrophobia: Secondary | ICD-10-CM | POA: Diagnosis not present

## 2019-04-01 DIAGNOSIS — Z801 Family history of malignant neoplasm of trachea, bronchus and lung: Secondary | ICD-10-CM | POA: Diagnosis not present

## 2019-04-01 DIAGNOSIS — C3491 Malignant neoplasm of unspecified part of right bronchus or lung: Secondary | ICD-10-CM | POA: Diagnosis not present

## 2019-04-01 DIAGNOSIS — Z833 Family history of diabetes mellitus: Secondary | ICD-10-CM | POA: Insufficient documentation

## 2019-04-01 DIAGNOSIS — Z87442 Personal history of urinary calculi: Secondary | ICD-10-CM | POA: Diagnosis not present

## 2019-04-01 DIAGNOSIS — Z923 Personal history of irradiation: Secondary | ICD-10-CM | POA: Diagnosis not present

## 2019-04-01 DIAGNOSIS — Z803 Family history of malignant neoplasm of breast: Secondary | ICD-10-CM | POA: Insufficient documentation

## 2019-04-01 DIAGNOSIS — Z8249 Family history of ischemic heart disease and other diseases of the circulatory system: Secondary | ICD-10-CM | POA: Insufficient documentation

## 2019-04-01 DIAGNOSIS — C3490 Malignant neoplasm of unspecified part of unspecified bronchus or lung: Secondary | ICD-10-CM

## 2019-04-01 DIAGNOSIS — R110 Nausea: Secondary | ICD-10-CM | POA: Diagnosis not present

## 2019-04-01 NOTE — Assessment & Plan Note (Addendum)
1.  Recurrent small cell lung cancer: -He was treated for limited stage small cell lung cancer with chemoradiation therapy from April 16, 2016 through 06/21/2016, status post prophylactic cranial irradiation. -He was treated at [facility] for cavitary lesion of the left lung based on the PET scan from 01/26/2019. -PET scan on 01/26/2019 showed two hypermetabolic solid right pulmonary nodules, largest 1.6 cm in the peripheral right lower lobe, both enlarging over the past year compatible with pulmonary metastasis.  Hypermetabolic them associated with nodular consolidation in the peripheral apical right upper lobe at the site of the original treated tumor, mildly increasing in size over the past year.  No hypermetabolic thoracic lymphadenopathy or extrathoracic metastatic disease.  Hypermetabolism associated with cavitary masslike consolidation in the posterior left lower lobe. -CT chest on 03/11/2019 showed stable to mild increase in size of the subpleural lesion within the lateral right apex compared with 01/26/2019.  Subpleural nodules overlying lateral right upper lobe and right lower lobe demonstrate continued mild progression when compared with 02/25/2019 and 01/26/2019.  No new sites of disease. -Right upper lobe lung mass biopsy on 03/23/2019 consistent with small cell carcinoma. -We discussed the pathology report, prognosis and further management in detail. -He reports that his cough is better.  He has some greenish expectoration.  He finished antibiotic yesterday. -I have recommended restaging scans.  I will also do a CT of the chest to follow-up on the lung abscess. -I have recommended doing an MRI of the brain.  However he was reluctant to consider it because of claustrophobia.  I will do a CT scan of the head with and without contrast. -I have recommended a port placement. -We will plan to treat with chemoimmunotherapy regimen.  He tells me that he was hospitalized the after each cycle in 2018  secondary to feeling sick.  We will consider extra hydration and antinausea medication after each cycle and probably once a week.

## 2019-04-01 NOTE — Patient Instructions (Signed)
[place] at [facility] Discharge Instructions  You were seen today by Dr. Delton Coombes. He went over your recent lab and test results. You will need chemotherapy again. He will schedule you for a port-a-cath placement. He will also schedule you for scans. He will see you back after scans for follow up.   Thank you for choosing [place] at [facility] to provide your oncology and hematology care.  To afford each patient quality time with our provider, please arrive at least 15 minutes before your scheduled appointment time.   If you have a lab appointment with the [facility] please come in thru the  Main Entrance and check in at the main information desk  You need to re-schedule your appointment should you arrive 10 or more minutes late.  We strive to give you quality time with our providers, and arriving late affects you and other patients whose appointments are after yours.  Also, if you no show three or more times for appointments you may be dismissed from the clinic at the providers discretion.     Again, thank you for choosing [facility].  Our hope is that these requests will decrease the amount of time that you wait before being seen by our physicians.       _____________________________________________________________  Should you have questions after your visit to [facility], please contact our office at [phone] between the hours of 8:00 a.m. and 4:30 p.m.  Voicemails left after 4:00 p.m. will not be returned until the following business day.  For prescription refill requests, have your pharmacy contact our office and allow 72 hours.    Cancer Center Support Programs:   > Cancer Support Group  2nd Tuesday of the month 1pm-2pm, Journey Room

## 2019-04-01 NOTE — Progress Notes (Signed)
[place], [place] [address]   CLINIC:  Medical Oncology/Hematology  PCP:  [name], NP [address] [place]  [address] [phone]   REASON FOR VISIT:  Follow-up for small cell lung cancer.  CURRENT THERAPY: Palliative chemotherapy being planned.  BRIEF ONCOLOGIC HISTORY:  Oncology History  Small cell lung cancer ([place])  04/03/2016 Initial Diagnosis   Small cell lung cancer ([place])   04/09/2016 Imaging   MRI brain- Negative for metastatic disease to the brain. No acute abnormality.   04/09/2016 Procedure   Right IJ port catheter placement by IR   04/13/2016 PET scan   . The right apical lung mass has significantly enlarged and there is new right paratracheal adenopathy as well as a hypermetabolic new lymph node in the right inner infraclavicular fossa potentially with early impingement on adjacent neurovascular structures. 2. Other imaging findings of potential clinical significance: Chronic bilateral maxillary sinusitis. Left carotid atherosclerotic calcification. Aortoiliac atherosclerotic vascular disease. Coronary atherosclerosis. Paraseptal emphysema. Mild atelectasis in the lung bases. Scattered sigmoid colon diverticula. Probable right pars defect at L5.   05/09/2016 - 07/12/2016 Radiation Therapy   Radiotherapy to right Lung+MS 63 Gy/35 fractions at 1.8 GY/f using 6 x photons 3- D CRT technique Dates: 4/18-6/21/18       06/19/2016 Treatment Plan Change   Cisplatin dose reduced by 20% for cycle #4 due to severe thrombocytopenia following cycle #3 (13,000).   08/21/2016 PET scan   1. Marked reduction in size and metabolic activity of RIGHT upper lobe mass. 2. Resolution of mediastinal and RIGHT supraclavicular lymphadenopathy. 3. No evidence of disease progression.      CANCER STAGING: Cancer Staging Small cell lung cancer ([place]) Staging form: Lung, AJCC 8th Edition - Clinical: Stage IIIC (cT3,  cN3, cM0) - Signed by Twana First, MD on 04/16/2016    INTERVAL HISTORY:  [name] 70 y.o. male seen for follow-up of her recurrent small cell lung cancer.  He was found to have increasing in size of the lung nodules.  We have arranged him for right upper lobe lung biopsy which she had it done on March 1.  He reports that his cough and breathing has improved.  He finished antibiotic yesterday.  He has some cough with greenish expectoration.  Reports some numbness in the hands and feet which is stable.  Appetite is 100%.  Energy levels are 75%.   REVIEW OF SYSTEMS:  Review of Systems  Respiratory: Positive for cough and shortness of breath.   Gastrointestinal: Positive for constipation.  Neurological: Positive for numbness.  All other systems reviewed and are negative.    PAST MEDICAL/SURGICAL HISTORY:  Past Medical History:  Diagnosis Date  . Alcohol abuse    Quit 08/23/11  . Arthritis   . Atrial fibrillation ([place])   . Atrial flutter ([place])    CTI ablation by Dr [name] 10/2011  . Bacteremia due to Gram-positive bacteria 11/29/2016  . COPD (chronic obstructive pulmonary disease) ([place])   . Depression   . Essential hypertension   . History of cardiomyopathy    LVEF 25-30% 08/2011 with subsequent normalization  . History of kidney stones   . Obesity   . Prostate enlargement   . Small cell lung cancer ([place])    Right upper lobe - follows with [facility]  . Type 2 diabetes mellitus ([place])    Past Surgical History:  Procedure Laterality Date  . APPENDECTOMY    .  ATRIAL ABLATION SURGERY  11/06/2011   CTI ablation for atrial flutter by Dr [name]  . ATRIAL FLUTTER ABLATION N/A 11/06/2011   Procedure: ATRIAL FLUTTER ABLATION;  Surgeon: Thompson Grayer, MD;  Location: [facility] CATH LAB;  Service: Cardiovascular;  Laterality: N/A;  . Cataracts    . COLONOSCOPY WITH PROPOFOL N/A 06/06/2017   Procedure: COLONOSCOPY WITH PROPOFOL;  Surgeon: [name], MD;  Location[facility] ENDO SUITE;   Service: Endoscopy;  Laterality: N/A;  1:15pm  . IR GENERIC HISTORICAL  04/09/2016   IR US GUIDE VASC ACCESS RIGHT 04/09/2016 [name], DO [facility]-INTERV RAD  . IR GENERIC HISTORICAL  04/09/2016   IR FLUORO GUIDE PORT INSERTION RIGHT 04/09/2016 [name], DO [facility]  . PORT-A-CATH REMOVAL N/A 04/24/2017   Procedure: MINOR REMOVAL PORT-A-CATH;  Surgeon: [name], MD;  Location: [facility] ORS;  Service: General;  Laterality: N/A;  . TONSILLECTOMY       SOCIAL HISTORY:  Social History   Socioeconomic History  . Marital status: Single    Spouse name: Not on file  . Number of children: Not on file  . Years of education: Not on file  . Highest education level: Not on file  Occupational History  . Occupation: retired     Comment: Building services engineer  Tobacco Use  . Smoking status: Former Smoker    Packs/day: 0.50    Years: 47.00    Pack years: 23.50    Types: Cigarettes    Quit date: 05/30/2012    Years since quitting: 6.8  . Smokeless tobacco: Former Systems developer    Quit date: 04/10/2014  . Tobacco comment: encouraged to quit today 03/04/12  Substance and Sexual Activity  . Alcohol use: No    Comment: quit 3 1/2 years ago  . Drug use: Not Currently    Comment: marajuana occasionally  . Sexual activity: Not Currently  Other Topics Concern  . Not on file  Social History Narrative   Lives in [place] alone.   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 Difficulty of Paying Living Expenses: Not on file  Food Insecurity:   . Worried About Charity fundraiser in the Last Year: Not on file  . Ran Out of Food in the Last Year: Not on file  Transportation Needs:   . Lack of Transportation (Medical): Not on file  . Lack of Transportation (Non-Medical): Not on file  Physical Activity:   . Days of Exercise per Week: Not on file  . Minutes of Exercise per Session: Not on file  Stress:   . Feeling  of Stress : Not on file  Social Connections:   . Frequency of Communication with Friends and Family: Not on file  . Frequency of Social Gatherings with Friends and Family: Not on file  . Attends Religious Services: Not on file  . Active Member of Clubs or Organizations: Not on file  . Attends Archivist Meetings: Not on file  . Marital Status: Not on file  Intimate Partner Violence:   . Fear of Current or Ex-Partner: Not on file  . Emotionally Abused: Not on file  . Physically Abused: Not on file  . Sexually Abused: Not on file    FAMILY HISTORY:  Family History  Problem Relation Age of Onset  . Diabetes Sister   . Hypertension Mother   . Lung cancer Mother   .  Hypertension Father   . Heart attack Father   . Hypertension Sister   . Hypertension Sister   . Hypertension Sister   . Pulmonary fibrosis Sister   . Breast cancer Cousin   . Colon cancer Neg Hx     CURRENT MEDICATIONS:  Outpatient Encounter Medications as of 04/01/2019  Medication Sig Note  . ACCU-CHEK AVIVA PLUS test strip check blood sugar TWICE DAILY   . apixaban (ELIQUIS) 5 MG TABS tablet Take 5 mg by mouth 2 (two) times daily.    Marland Kitchen AQUALANCE LANCETS 30G MISC USE TO check blood glucose twice daily   . atorvastatin (LIPITOR) 20 MG tablet Take 1 tablet (20 mg total) by mouth daily.   Marland Kitchen diltiazem (DILACOR XR) 240 MG 24 hr capsule Take 240 mg by mouth daily.   Marland Kitchen dofetilide (TIKOSYN) 250 MCG capsule Take 1 capsule (250 mcg total) by mouth 2 (two) times daily.   . Fluticasone-Umeclidin-Vilant (TRELEGY ELLIPTA) 100-62.5-25 MCG/INH AEPB Inhale 1 puff into the lungs daily.   . furosemide (LASIX) 40 MG tablet TAKE 1 TABLET BY MOUTH EVERY DAY (Patient taking differently: Take 40 mg by mouth daily. )   . gabapentin (NEURONTIN) 300 MG capsule Take 600 mg by mouth 4 (four) times daily.   . hydrOXYzine (VISTARIL) 25 MG capsule Take 25 mg by mouth every 8 (eight) hours.   . insulin lispro (HUMALOG) 100 UNIT/ML  injection Inject 2-20 Units into the skin 3 (three) times daily with meals. Sliding scale    . Lancet Devices (ADJUSTABLE LANCING DEVICE) MISC TO check blood glucose daily   . LANTUS SOLOSTAR 100 UNIT/ML Solostar Pen INJECT 20 UNITS UNDER THE SKIN EVERY MORNING AND INJECT 20 UNITS UNDER THE SKIN EVERY EVENING (Patient taking differently: Inject 20 Units into the skin 2 (two) times daily. )   . Lidocaine (SALONPAS PAIN RELIEVING EX) Apply 1 patch topically every 12 (twelve) hours.   . metFORMIN (GLUCOPHAGE) 500 MG tablet Take 500 mg by mouth in the morning and at bedtime.   . metoprolol succinate (TOPROL-XL) 100 MG 24 hr tablet Take 100 mg by mouth daily.   . Multiple Vitamins-Minerals (COMPLETE MULTIVITAMIN/MINERAL PO) Take 1 tablet by mouth daily.    . potassium chloride (KLOR-CON) 10 MEQ tablet Take 10 mEq by mouth 2 (two) times daily.   Marland Kitchen senna (SENOKOT) 8.6 MG tablet Take 2 tablets by mouth daily.    Marland Kitchen spironolactone (ALDACTONE) 25 MG tablet Take 12.5 mg by mouth 2 (two) times daily.    . tamsulosin (FLOMAX) 0.4 MG CAPS capsule Take 1 capsule (0.4 mg total) by mouth 2 (two) times daily after a meal.   . traZODone (DESYREL) 50 MG tablet Take 50 mg by mouth at bedtime.   Marland Kitchen acetaminophen (TYLENOL) 500 MG tablet Take 1,000 mg by mouth every 6 (six) hours as needed for moderate pain or headache.   . Artificial Saliva (BIOTENE DRY MOUTH MOISTURIZING) SOLN Apply 1 Dose topically as needed (dry mouth).    . Carboxymethylcellul-Glycerin (LUBRICATING EYE DROPS OP) Place 1 drop into the left eye daily as needed (dry eyes).   . fluticasone (FLONASE) 50 MCG/ACT nasal spray Place 2 sprays into both nostrils daily. (Patient not taking: Reported on 04/01/2019)   . guaifenesin (ROBITUSSIN) 100 MG/5ML syrup Take 200 mg by mouth at bedtime as needed for cough.   Marland Kitchen HYDROcodone-acetaminophen (NORCO/VICODIN) 5-325 MG tablet Take 1 tablet by mouth every 4 (four) hours as needed. (Patient not taking: Reported on  04/01/2019)   .  ipratropium-albuterol (DUONEB) 0.5-2.5 (3) MG/3ML SOLN Take 3 mLs by nebulization every 4 (four) hours as needed (shortness of breath).    Marland Kitchen levalbuterol (XOPENEX) 0.63 MG/3ML nebulizer solution Inhale 3 mLs (0.63 mg total) into the lungs 4 (four) times daily. (Patient not taking: Reported on 04/01/2019)   . PROAIR HFA 108 (90 Base) MCG/ACT inhaler INHALE TWO PUFFS BY MOUTH EVERY 4 HOURS AS NEEDED FOR WHEEZING (Patient not taking: No sig reported)   . tiotropium (SPIRIVA HANDIHALER) 18 MCG inhalation capsule Place 1 capsule (18 mcg total) into inhaler and inhale daily. (Patient not taking: Reported on 04/01/2019)   . [DISCONTINUED] amoxicillin-clavulanate (AUGMENTIN) 875-125 MG tablet Take 1 tablet by mouth 2 (two) times daily.  03/18/2019: Unknown start / finish date   No facility-administered encounter medications on file as of 04/01/2019.    ALLERGIES:  No Known Allergies   PHYSICAL EXAM:  ECOG Performance status: 1  Vitals:   04/01/19 1203  BP: 125/60  Pulse: 76  Resp: 18  Temp: (!) 97.1 F (36.2 C)  SpO2: 91%   Filed Weights   04/01/19 1203  Weight: 263 lb 9.6 oz (119.6 kg)    Physical Exam Vitals reviewed.  Constitutional:      Appearance: Normal appearance.  Cardiovascular:     Rate and Rhythm: Normal rate and regular rhythm.     Heart sounds: Normal heart sounds.  Pulmonary:     Effort: Pulmonary effort is normal.     Comments: Crepitations in the left lower base heard. Abdominal:     General: There is no distension.     Palpations: Abdomen is soft. There is no mass.  Lymphadenopathy:     Cervical: No cervical adenopathy.  Skin:    General: Skin is warm.  Neurological:     General: No focal deficit present.     Mental Status: He is alert and oriented to person, place, and time.  Psychiatric:        Mood and Affect: Mood normal.        Behavior: Behavior normal.      LABORATORY DATA:  I have reviewed the labs as listed.  CBC    Component  Value Date/Time   WBC 6.3 03/23/2019 0930   RBC 4.43 03/23/2019 0930   HGB 13.7 03/23/2019 0930   HCT 43.6 03/23/2019 0930   PLT 272 03/23/2019 0930   MCV 98.4 03/23/2019 0930   MCH 30.9 03/23/2019 0930   MCHC 31.4 03/23/2019 0930   RDW 13.8 03/23/2019 0930   LYMPHSABS 0.8 03/23/2019 0930   MONOABS 0.7 03/23/2019 0930   EOSABS 0.1 03/23/2019 0930   BASOSABS 0.0 03/23/2019 0930   CMP Latest Ref Rng & Units 03/23/2019 03/11/2019 10/16/2017  Glucose 70 - 99 mg/dL 346(H) - 233(H)  BUN 8 - 23 mg/dL 22 - 29(H)  Creatinine 0.61 - 1.24 mg/dL 1.32(H) 1.00 1.24  Sodium 135 - 145 mmol/L 137 - 138  Potassium 3.5 - 5.1 mmol/L 4.2 - 3.7  Chloride 98 - 111 mmol/L 98 - 102  CO2 22 - 32 mmol/L 26 - 25  Calcium 8.9 - 10.3 mg/dL 9.8 - 8.5(L)  Total Protein 6.5 - 8.1 g/dL - - 6.5  Total Bilirubin 0.3 - 1.2 mg/dL - - 0.6  Alkaline Phos 38 - 126 U/L - - 52  AST 15 - 41 U/L - - 26  ALT 0 - 44 U/L - - 44       DIAGNOSTIC IMAGING:  I have independently reviewed the  scans and discussed with the patient.     ASSESSMENT & PLAN:   Small cell lung cancer ([place]) 1.  Recurrent small cell lung cancer: -He was treated for limited stage small cell lung cancer with chemoradiation therapy from April 16, 2016 through 06/21/2016, status post prophylactic cranial irradiation. -He was treated at [facility] for cavitary lesion of the left lung based on the PET scan from 01/26/2019. -PET scan on 01/26/2019 showed two hypermetabolic solid right pulmonary nodules, largest 1.6 cm in the peripheral right lower lobe, both enlarging over the past year compatible with pulmonary metastasis.  Hypermetabolic them associated with nodular consolidation in the peripheral apical right upper lobe at the site of the original treated tumor, mildly increasing in size over the past year.  No hypermetabolic thoracic lymphadenopathy or extrathoracic metastatic disease.  Hypermetabolism associated with cavitary masslike consolidation in  the posterior left lower lobe. -CT chest on 03/11/2019 showed stable to mild increase in size of the subpleural lesion within the lateral right apex compared with 01/26/2019.  Subpleural nodules overlying lateral right upper lobe and right lower lobe demonstrate continued mild progression when compared with 02/25/2019 and 01/26/2019.  No new sites of disease. -Right upper lobe lung mass biopsy on 03/23/2019 consistent with small cell carcinoma. -We discussed the pathology report, prognosis and further management in detail. -He reports that his cough is better.  He has some greenish expectoration.  He finished antibiotic yesterday. -I have recommended restaging scans.  I will also do a CT of the chest to follow-up on the lung abscess. -I have recommended doing an MRI of the brain.  However he was reluctant to consider it because of claustrophobia.  I will do a CT scan of the head with and without contrast. -I have recommended a port placement. -We will plan to treat with chemoimmunotherapy regimen.  He tells me that he was hospitalized the after each cycle in 2018 secondary to feeling sick.  We will consider extra hydration and antinausea medication after each cycle and probably once a week.      Orders placed this encounter:  Orders Placed This Encounter  Procedures  . CT Chest W Contrast  . CT Head W Wo Contrast  . CT Abdomen Pelvis W Contrast      Derek Jack, MD [place] [phone]

## 2019-04-07 ENCOUNTER — Encounter: Payer: Self-pay | Admitting: General Surgery

## 2019-04-07 ENCOUNTER — Other Ambulatory Visit: Payer: Self-pay

## 2019-04-07 ENCOUNTER — Ambulatory Visit (INDEPENDENT_AMBULATORY_CARE_PROVIDER_SITE_OTHER): Payer: Medicare Other | Admitting: General Surgery

## 2019-04-07 VITALS — BP 143/77 | HR 86 | Temp 97.9°F | Resp 12 | Ht 72.0 in | Wt 261.0 lb

## 2019-04-07 DIAGNOSIS — C3491 Malignant neoplasm of unspecified part of right bronchus or lung: Secondary | ICD-10-CM | POA: Diagnosis not present

## 2019-04-07 DIAGNOSIS — F329 Major depressive disorder, single episode, unspecified: Secondary | ICD-10-CM | POA: Insufficient documentation

## 2019-04-07 DIAGNOSIS — F32A Depression, unspecified: Secondary | ICD-10-CM | POA: Insufficient documentation

## 2019-04-07 NOTE — Patient Instructions (Signed)

## 2019-04-08 NOTE — Progress Notes (Signed)
Chase Herrera; [id number]; 1950/02/11   HPI Patient is a 69 year old white male with multiple medical problems who was referred to my care by Dr. Delton Coombes for Port-A-Cath placement.  He has recurrent small cell carcinoma of the right lung.  He has had a Port-A-Cath in place in the past but this was removed.  He currently has no pain.  He is on Eliquis for atrial fibrillation.  He is also on home O2 for COPD. Past Medical History:  Diagnosis Date  . Alcohol abuse    Quit 08/23/11  . Arthritis   . Atrial fibrillation ([place])   . Atrial flutter ([place])    CTI ablation by Dr [name] 10/2011  . Bacteremia due to Gram-positive bacteria 11/29/2016  . COPD (chronic obstructive pulmonary disease) ([place])   . Depression   . Essential hypertension   . History of cardiomyopathy    LVEF 25-30% 08/2011 with subsequent normalization  . History of kidney stones   . Obesity   . Prostate enlargement   . Small cell lung cancer ([place])    Right upper lobe - follows with [facility]  . Type 2 diabetes mellitus ([place])     Past Surgical History:  Procedure Laterality Date  . APPENDECTOMY    . ATRIAL ABLATION SURGERY  11/06/2011   CTI ablation for atrial flutter by Dr [name]  . ATRIAL FLUTTER ABLATION N/A 11/06/2011   Procedure: ATRIAL FLUTTER ABLATION;  Surgeon: Thompson Grayer, MD;  Location: [facility] CATH LAB;  Service: Cardiovascular;  Laterality: N/A;  . Cataracts    . COLONOSCOPY WITH PROPOFOL N/A 06/06/2017   Procedure: COLONOSCOPY WITH PROPOFOL;  Surgeon: [name], MD;  [facility] ENDO SU[facility];  Service: Endoscopy;  Laterality: N/A;  1:15pm  . IR GENERIC HISTORICAL  04/09/2016   IR US GUIDE VASC ACCESS RIGHT 04/09/2016 [name], DO [facility]INTER[facility]  . IR GENERIC HISTORICAL  04/09/2016   IR FLUORO GUIDE PORT INSERTION RIGHT 04/09/2016 [name], DO [facility]  . PORT-A-CATH REMOVAL N/A 04/24/2017   Procedure: MINOR REMOVAL PORT-A-CATH;  Surgeon: [name], MD;  Location: [facility] ORS;  Service:  General;  Laterality: N/A;  . TONSILLECTOMY      Family History  Problem Relation Age of Onset  . Diabetes Sister   . Hypertension Mother   . Lung cancer Mother   . Hypertension Father   . Heart attack Father   . Hypertension Sister   . Hypertension Sister   . Hypertension Sister   . Pulmonary fibrosis Sister   . Breast cancer Cousin   . Colon cancer Neg Hx     Current Outpatient Medications on File Prior to Visit  Medication Sig Dispense Refill  . ACCU-CHEK AVIVA PLUS test strip check blood sugar TWICE DAILY    . acetaminophen (TYLENOL) 500 MG tablet Take 1,000 mg by mouth every 6 (six) hours as needed for moderate pain or headache.    Marland Kitchen apixaban (ELIQUIS) 5 MG TABS tablet Take 5 mg by mouth 2 (two) times daily.     Marland Kitchen AQUALANCE LANCETS 30G MISC USE TO check blood glucose twice daily  99  . Artificial Saliva (BIOTENE DRY MOUTH MOISTURIZING) SOLN Apply 1 Dose topically as needed (dry mouth).     Marland Kitchen atorvastatin (LIPITOR) 20 MG tablet Take 1 tablet (20 mg total) by mouth daily. 90 tablet 3  . Carboxymethylcellul-Glycerin (LUBRICATING EYE DROPS OP) Place 1 drop into the left eye daily as needed (dry eyes).    Marland Kitchen diltiazem (  DILACOR XR) 240 MG 24 hr capsule Take 240 mg by mouth daily.    Marland Kitchen dofetilide (TIKOSYN) 250 MCG capsule Take 1 capsule (250 mcg total) by mouth 2 (two) times daily.    . fluticasone (FLONASE) 50 MCG/ACT nasal spray Place 2 sprays into both nostrils daily. 16 g 6  . Fluticasone-Umeclidin-Vilant (TRELEGY ELLIPTA) 100-62.5-25 MCG/INH AEPB Inhale 1 puff into the lungs daily.    . furosemide (LASIX) 40 MG tablet TAKE 1 TABLET BY MOUTH EVERY DAY (Patient taking differently: Take 40 mg by mouth daily. ) 90 tablet 0  . gabapentin (NEURONTIN) 300 MG capsule Take 600 mg by mouth 4 (four) times daily.    Marland Kitchen guaifenesin (ROBITUSSIN) 100 MG/5ML syrup Take 200 mg by mouth at bedtime as needed for cough.    Marland Kitchen HYDROcodone-acetaminophen (NORCO/VICODIN) 5-325 MG tablet Take 1 tablet by  mouth every 4 (four) hours as needed. 10 tablet 0  . hydrOXYzine (VISTARIL) 25 MG capsule Take 25 mg by mouth every 8 (eight) hours.    . insulin lispro (HUMALOG) 100 UNIT/ML injection Inject 2-20 Units into the skin 3 (three) times daily with meals. Sliding scale     . ipratropium-albuterol (DUONEB) 0.5-2.5 (3) MG/3ML SOLN Take 3 mLs by nebulization every 4 (four) hours as needed (shortness of breath).     [place] Guise Devices (ADJUSTABLE LANCING DEVICE) MISC TO check blood glucose daily    . LANTUS SOLOSTAR 100 UNIT/ML Solostar Pen INJECT 20 UNITS UNDER THE SKIN EVERY MORNING AND INJECT 20 UNITS UNDER THE SKIN EVERY EVENING (Patient taking differently: Inject 20 Units into the skin 2 (two) times daily. ) 45 mL 0  . levalbuterol (XOPENEX) 0.63 MG/3ML nebulizer solution Inhale 3 mLs (0.63 mg total) into the lungs 4 (four) times daily. 3 mL 0  . Lidocaine (SALONPAS PAIN RELIEVING EX) Apply 1 patch topically every 12 (twelve) hours.    . metFORMIN (GLUCOPHAGE) 500 MG tablet Take 500 mg by mouth in the morning and at bedtime.    . metoprolol succinate (TOPROL-XL) 100 MG 24 hr tablet Take 100 mg by mouth daily.    . Multiple Vitamins-Minerals (COMPLETE MULTIVITAMIN/MINERAL PO) Take 1 tablet by mouth daily.   99  . potassium chloride (KLOR-CON) 10 MEQ tablet Take 10 mEq by mouth 2 (two) times daily.    Marland Kitchen PROAIR HFA 108 (90 Base) MCG/ACT inhaler INHALE TWO PUFFS BY MOUTH EVERY 4 HOURS AS NEEDED FOR WHEEZING 17 g 0  . senna (SENOKOT) 8.6 MG tablet Take 2 tablets by mouth daily.     Marland Kitchen spironolactone (ALDACTONE) 25 MG tablet Take 12.5 mg by mouth 2 (two) times daily.     . tamsulosin (FLOMAX) 0.4 MG CAPS capsule Take 1 capsule (0.4 mg total) by mouth 2 (two) times daily after a meal. 180 capsule 1  . tiotropium (SPIRIVA HANDIHALER) 18 MCG inhalation capsule Place 1 capsule (18 mcg total) into inhaler and inhale daily. 30 capsule 12  . traZODone (DESYREL) 50 MG tablet Take 50 mg by mouth at bedtime.  5   No  current facility-administered medications on file prior to visit.    No Known Allergies  Social History   Substance and Sexual Activity  Alcohol Use No   Comment: quit 3 1/2 years ago    Social History   Tobacco Use  Smoking Status Former Smoker  . Packs/day: 0.50  . Years: 47.00  . Pack years: 23.50  . Types: Cigarettes  . Quit date: 05/30/2012  . Years since  quitting: 6.8  Smokeless Tobacco Former Systems developer  . Quit date: 04/10/2014  Tobacco Comment   encouraged to quit today 03/04/12    Review of Systems  Constitutional: Positive for malaise/fatigue.  HENT: Negative.   Eyes: Negative.   Respiratory: Positive for shortness of breath.   Cardiovascular: Negative.   Gastrointestinal: Negative.   Genitourinary: Negative.   Musculoskeletal: Negative.   Skin: Negative.   Neurological: Positive for sensory change and weakness.  Endo/Heme/Allergies: Bruises/bleeds easily.  Psychiatric/Behavioral: Negative.     Objective   Vitals:   04/07/19 1314  BP: (!) 143/77  Pulse: 86  Resp: 12  Temp: 97.9 F (36.6 C)  SpO2: (!) 86%    Physical Exam Vitals reviewed.  Constitutional:      Appearance: Normal appearance. He is obese.  HENT:     Head: Normocephalic and atraumatic.  Cardiovascular:     Rate and Rhythm: Normal rate. Rhythm irregular.     Heart sounds: No murmur. No friction rub. No gallop.   Pulmonary:     Effort: Pulmonary effort is normal. No respiratory distress.     Breath sounds: No stridor. No wheezing, rhonchi or rales.     Comments: Decreased distant breath sounds noted bilaterally.  Patient is on nasal cannula. Skin:    General: Skin is warm and dry.  Neurological:     Mental Status: He is alert and oriented to person, place, and time.    Dr. Tomie China notes reviewed Assessment  Small cell carcinoma of right lung Plan   Will discuss with Dr. Delton Coombes to coordinate Port-A-Cath placement.  May need interventional radiology to place the port if  he is already at [facility] for another procedure.  Will need to be off his Eliquis prior to placement.

## 2019-04-13 NOTE — H&P (Signed)
Chase Herrera; [id number]; 1950-02-13   HPI Patient is a 69 year old white male with multiple medical problems who was referred to my care by Dr. Delton Coombes for Port-A-Cath placement.  He has recurrent small cell carcinoma of the right lung.  He has had a Port-A-Cath in place in the past but this was removed.  He currently has no pain.  He is on Eliquis for atrial fibrillation.  He is also on home O2 for COPD. Past Medical History:  Diagnosis Date  . Alcohol abuse    Quit 08/23/11  . Arthritis   . Atrial fibrillation ([place])   . Atrial flutter ([place])    CTI ablation by Dr [name] 10/2011  . Bacteremia due to Gram-positive bacteria 11/29/2016  . COPD (chronic obstructive pulmonary disease) ([place])   . Depression   . Essential hypertension   . History of cardiomyopathy    LVEF 25-30% 08/2011 with subsequent normalization  . History of kidney stones   . Obesity   . Prostate enlargement   . Small cell lung cancer ([place])    Right upper lobe - follows with [facility]  . Type 2 diabetes mellitus ([place])     Past Surgical History:  Procedure Laterality Date  . APPENDECTOMY    . ATRIAL ABLATION SURGERY  11/06/2011   CTI ablation for atrial flutter by Dr [name]  . ATRIAL FLUTTER ABLATION N/A 11/06/2011   Procedure: ATRIAL FLUTTER ABLATION;  Surgeon: Thompson Grayer, MD;  Location: [facility] CATH LAB;  Service: Cardiovascular;  Laterality: N/A;  . Cataracts    . COLONOSCOPY WITH PROPOFOL N/A 06/06/2017   Procedure: COLONOSCOPY WITH PROPOFOL;  Surgeon: [name], MD;  Location: [facility] ENDO SUITE;  Service: Endoscopy;  Laterality: N/A;  1:15pm  . IR GENERIC HISTORICAL  04/09/2016   IR US GUIDE VASC ACCESS RIGHT 04/09/2016 [name], DO [facility]-INTER[facility] RAD  . IR GENERIC HISTORICAL  04/09/2016   IR FLUORO GUIDE PORT INSERTION RIGHT 04/09/2016 [name], DO [facility]  . PORT-A-CATH REMOVAL N/A 04/24/2017   Procedure: MINOR REMOVAL PORT-A-CATH;  Surgeon: [name], MD;  Location: [facility] ORS;  Service:  General;  Laterality: N/A;  . TONSILLECTOMY      Family History  Problem Relation Age of Onset  . Diabetes Sister   . Hypertension Mother   . Lung cancer Mother   . Hypertension Father   . Heart attack Father   . Hypertension Sister   . Hypertension Sister   . Hypertension Sister   . Pulmonary fibrosis Sister   . Breast cancer Cousin   . Colon cancer Neg Hx     Current Outpatient Medications on File Prior to Visit  Medication Sig Dispense Refill  . ACCU-CHEK AVIVA PLUS test strip check blood sugar TWICE DAILY    . acetaminophen (TYLENOL) 500 MG tablet Take 1,000 mg by mouth every 6 (six) hours as needed for moderate pain or headache.    Marland Kitchen apixaban (ELIQUIS) 5 MG TABS tablet Take 5 mg by mouth 2 (two) times daily.     Marland Kitchen AQUALANCE LANCETS 30G MISC USE TO check blood glucose twice daily  99  . Artificial Saliva (BIOTENE DRY MOUTH MOISTURIZING) SOLN Apply 1 Dose topically as needed (dry mouth).     Marland Kitchen atorvastatin (LIPITOR) 20 MG tablet Take 1 tablet (20 mg total) by mouth daily. 90 tablet 3  . Carboxymethylcellul-Glycerin (LUBRICATING EYE DROPS OP) Place 1 drop into the left eye daily as needed (dry eyes).    Marland Kitchen diltiazem (  DILACOR XR) 240 MG 24 hr capsule Take 240 mg by mouth daily.    Marland Kitchen dofetilide (TIKOSYN) 250 MCG capsule Take 1 capsule (250 mcg total) by mouth 2 (two) times daily.    . fluticasone (FLONASE) 50 MCG/ACT nasal spray Place 2 sprays into both nostrils daily. 16 g 6  . Fluticasone-Umeclidin-Vilant (TRELEGY ELLIPTA) 100-62.5-25 MCG/INH AEPB Inhale 1 puff into the lungs daily.    . furosemide (LASIX) 40 MG tablet TAKE 1 TABLET BY MOUTH EVERY DAY (Patient taking differently: Take 40 mg by mouth daily. ) 90 tablet 0  . gabapentin (NEURONTIN) 300 MG capsule Take 600 mg by mouth 4 (four) times daily.    Marland Kitchen guaifenesin (ROBITUSSIN) 100 MG/5ML syrup Take 200 mg by mouth at bedtime as needed for cough.    Marland Kitchen HYDROcodone-acetaminophen (NORCO/VICODIN) 5-325 MG tablet Take 1 tablet by  mouth every 4 (four) hours as needed. 10 tablet 0  . hydrOXYzine (VISTARIL) 25 MG capsule Take 25 mg by mouth every 8 (eight) hours.    . insulin lispro (HUMALOG) 100 UNIT/ML injection Inject 2-20 Units into the skin 3 (three) times daily with meals. Sliding scale     . ipratropium-albuterol (DUONEB) 0.5-2.5 (3) MG/3ML SOLN Take 3 mLs by nebulization every 4 (four) hours as needed (shortness of breath).     Elmore Guise Devices (ADJUSTABLE LANCING DEVICE) MISC TO check blood glucose daily    . LANTUS SOLOSTAR 100 UNIT/ML Solostar Pen INJECT 20 UNITS UNDER THE SKIN EVERY MORNING AND INJECT 20 UNITS UNDER THE SKIN EVERY EVENING (Patient taking differently: Inject 20 Units into the skin 2 (two) times daily. ) 45 mL 0  . levalbuterol (XOPENEX) 0.63 MG/3ML nebulizer solution Inhale 3 mLs (0.63 mg total) into the lungs 4 (four) times daily. 3 mL 0  . Lidocaine (SALONPAS PAIN RELIEVING EX) Apply 1 patch topically every 12 (twelve) hours.    . metFORMIN (GLUCOPHAGE) 500 MG tablet Take 500 mg by mouth in the morning and at bedtime.    . metoprolol succinate (TOPROL-XL) 100 MG 24 hr tablet Take 100 mg by mouth daily.    . Multiple Vitamins-Minerals (COMPLETE MULTIVITAMIN/MINERAL PO) Take 1 tablet by mouth daily.   99  . potassium chloride (KLOR-CON) 10 MEQ tablet Take 10 mEq by mouth 2 (two) times daily.    Marland Kitchen PROAIR HFA 108 (90 Base) MCG/ACT inhaler INHALE TWO PUFFS BY MOUTH EVERY 4 HOURS AS NEEDED FOR WHEEZING 17 g 0  . senna (SENOKOT) 8.6 MG tablet Take 2 tablets by mouth daily.     Marland Kitchen spironolactone (ALDACTONE) 25 MG tablet Take 12.5 mg by mouth 2 (two) times daily.     . tamsulosin (FLOMAX) 0.4 MG CAPS capsule Take 1 capsule (0.4 mg total) by mouth 2 (two) times daily after a meal. 180 capsule 1  . tiotropium (SPIRIVA HANDIHALER) 18 MCG inhalation capsule Place 1 capsule (18 mcg total) into inhaler and inhale daily. 30 capsule 12  . traZODone (DESYREL) 50 MG tablet Take 50 mg by mouth at bedtime.  5   No  current facility-administered medications on file prior to visit.    No Known Allergies  Social History   Substance and Sexual Activity  Alcohol Use No   Comment: quit 3 1/2 years ago    Social History   Tobacco Use  Smoking Status Former Smoker  . Packs/day: 0.50  . Years: 47.00  . Pack years: 23.50  . Types: Cigarettes  . Quit date: 05/30/2012  . Years since  quitting: 6.8  Smokeless Tobacco Former Systems developer  . Quit date: 04/10/2014  Tobacco Comment   encouraged to quit today 03/04/12    Review of Systems  Constitutional: Positive for malaise/fatigue.  HENT: Negative.   Eyes: Negative.   Respiratory: Positive for shortness of breath.   Cardiovascular: Negative.   Gastrointestinal: Negative.   Genitourinary: Negative.   Musculoskeletal: Negative.   Skin: Negative.   Neurological: Positive for sensory change and weakness.  Endo/Heme/Allergies: Bruises/bleeds easily.  Psychiatric/Behavioral: Negative.     Objective   Vitals:   04/07/19 1314  BP: (!) 143/77  Pulse: 86  Resp: 12  Temp: 97.9 F (36.6 C)  SpO2: (!) 86%    Physical Exam Vitals reviewed.  Constitutional:      Appearance: Normal appearance. He is obese.  HENT:     Head: Normocephalic and atraumatic.  Cardiovascular:     Rate and Rhythm: Normal rate. Rhythm irregular.     Heart sounds: No murmur. No friction rub. No gallop.   Pulmonary:     Effort: Pulmonary effort is normal. No respiratory distress.     Breath sounds: No stridor. No wheezing, rhonchi or rales.     Comments: Decreased distant breath sounds noted bilaterally.  Patient is on nasal cannula. Skin:    General: Skin is warm and dry.  Neurological:     Mental Status: He is alert and oriented to person, place, and time.    Dr. Tomie China notes reviewed Assessment  Small cell carcinoma of right lung Plan   Will discuss with Dr. Delton Coombes to coordinate Port-A-Cath placement.  Risks and benefits of procedure including bleeding,  infection, and pneumothorax were fully explained to the patient, who gives informed consent.  Will need to be off his Eliquis prior to placement.

## 2019-04-17 ENCOUNTER — Other Ambulatory Visit (HOSPITAL_COMMUNITY): Payer: Self-pay | Admitting: Nurse Practitioner

## 2019-04-17 ENCOUNTER — Other Ambulatory Visit: Payer: Self-pay

## 2019-04-17 ENCOUNTER — Ambulatory Visit (HOSPITAL_COMMUNITY)
Admission: RE | Admit: 2019-04-17 | Discharge: 2019-04-17 | Disposition: A | Discharge status: Home / Self Care | Payer: Medicare Other | Source: Ambulatory Visit | Attending: Hematology | Admitting: Hematology

## 2019-04-17 DIAGNOSIS — C3491 Malignant neoplasm of unspecified part of right bronchus or lung: Secondary | ICD-10-CM | POA: Insufficient documentation

## 2019-04-17 DIAGNOSIS — C3490 Malignant neoplasm of unspecified part of unspecified bronchus or lung: Secondary | ICD-10-CM

## 2019-04-17 MED ORDER — IOHEXOL 300 MG/ML  SOLN
100.0000 mL | Freq: Once | INTRAMUSCULAR | Status: AC | PRN
  Administered 2019-04-17: 100 mL via INTRAVENOUS

## 2019-04-17 NOTE — Patient Instructions (Signed)
Chase Herrera  04/17/2019     @PREFPERIOPPHARMACY @   Your procedure is scheduled on  04/22/2019   Report to [facility] at  [phone]  A.M.  Call this number if you have problems the morning of surgery:  [phone]   Remember:  Do not eat or drink after midnight.                       Take these medicines the morning of surgery with A SIP OF WATER  Diltiazem, tikeson, gabapentin, hydrocodone(if needed), vistaril, metoprolol. Take 1/2 of your usual night time insulin dosage the night before your procedure. DO NOT take any medications for diabetes the morning of your procedure. Use your nebulizer and your inhalers before you come.   FOLLOW ANY INSTRUCTIONS GIVEN TO YOU BY DR [place].    Do not wear jewelry, make-up or nail polish.  Do not wear lotions, powders, or perfumes, or deodorant. Please brush your teeth.  Do not shave 48 hours prior to surgery.  Men may shave face and neck.  Do not bring valuables to the hospital.  [facility] is not responsible for any belongings or valuables.  Contacts, dentures or bridgework may not be worn into surgery.  Leave your suitcase in the car.  After surgery it may be brought to your room.  For patients admitted to the hospital, discharge time will be determined by your treatment team.  Patients discharged the day of surgery will not be allowed to drive home.   Name and phone number of your driver:   family Special instructions:  DO NOT smoke the morning of your procedure.  Please read over the following fact sheets that you were given. Anesthesia Post-op Instructions and Care and Recovery After Surgery       Implanted Port Insertion, Care After This sheet gives you information about how to care for yourself after your procedure. Your health care provider may also give you more specific instructions. If you have problems or questions, contact your health care provider. What can I expect after the  procedure? After the procedure, it is common to have:  Discomfort at the port insertion site.  Bruising on the skin over the port. This should improve over 3-4 days. Follow these instructions at home: [facility] care  After your port is placed, you will get a manufacturer's information card. The card has information about your port. Keep this card with you at all times.  Take care of the port as told by your health care provider. Ask your health care provider if you or a family member can get training for taking care of the port at home. A home health care nurse may also take care of the port.  Make sure to remember what type of port you have. Incision care      Follow instructions from your health care provider about how to take care of your port insertion site. Make sure you: ? Wash your hands with soap and water before and after you change your bandage (dressing). If soap and water are not available, use hand sanitizer. ? Change your dressing as told by your health care provider. ? Leave stitches (sutures), skin glue, or adhesive strips in place. These skin closures may need to stay in place for 2 weeks or longer. If adhesive strip edges start to loosen and curl up, you may trim the loose edges.  Do not remove adhesive strips completely unless your health care provider tells you to do that.  Check your port insertion site every day for signs of infection. Check for: ? Redness, swelling, or pain. ? Fluid or blood. ? Warmth. ? Pus or a bad smell. Activity  Return to your normal activities as told by your health care provider. Ask your health care provider what activities are safe for you.  Do not lift anything that is heavier than 10 lb (4.5 kg), or the limit that you are told, until your health care provider says that it is safe. General instructions  Take over-the-counter and prescription medicines only as told by your health care provider.  Do not take baths, swim, or use a hot tub  until your health care provider approves. Ask your health care provider if you may take showers. You may only be allowed to take sponge baths.  Do not drive for 24 hours if you were given a sedative during your procedure.  Wear a medical alert bracelet in case of an emergency. This will tell any health care providers that you have a port.  Keep all follow-up visits as told by your health care provider. This is important. Contact a health care provider if:  You cannot flush your port with saline as directed, or you cannot draw blood from the port.  You have a fever or chills.  You have redness, swelling, or pain around your port insertion site.  You have fluid or blood coming from your port insertion site.  Your port insertion site feels warm to the touch.  You have pus or a bad smell coming from the port insertion site. Get help right away if:  You have chest pain or shortness of breath.  You have bleeding from your port that you cannot control. Summary  Take care of the port as told by your health care provider. Keep the manufacturer's information card with you at all times.  Change your dressing as told by your health care provider.  Contact a health care provider if you have a fever or chills or if you have redness, swelling, or pain around your port insertion site.  Keep all follow-up visits as told by your health care provider. This information is not intended to replace advice given to you by your health care provider. Make sure you discuss any questions you have with your health care provider. Document Revised: 08/06/2017 Document Reviewed: 08/06/2017 Elsevier Patient Education  [place] An implanted port is a device that is placed under the skin. It is usually placed in the chest. The device can be used to give IV medicine, to take blood, or for dialysis. You may have an implanted port if:  You need IV medicine that would be  irritating to the small veins in your hands or arms.  You need IV medicines, such as antibiotics, for a long period of time.  You need IV nutrition for a long period of time.  You need dialysis. Having a port means that your health care provider will not need to use the veins in your arms for these procedures. You may have fewer limitations when using a port than you would if you used other types of long-term IVs, and you will likely be able to return to normal activities after your incision heals. An implanted port has two main parts:  Reservoir. The reservoir is the part where a needle is inserted to give  medicines or draw blood. The reservoir is round. After it is placed, it appears as a small, raised area under your skin.  Catheter. The catheter is a thin, flexible tube that connects the reservoir to a vein. Medicine that is inserted into the reservoir goes into the catheter and then into the vein. How is my port accessed? To access your port:  A numbing cream may be placed on the skin over the port site.  Your health care provider will put on a mask and sterile gloves.  The skin over your port will be cleaned carefully with a germ-killing soap and allowed to dry.  Your health care provider will gently pinch the port and insert a needle into it.  Your health care provider will check for a blood return to make sure the port is in the vein and is not clogged.  If your port needs to remain accessed to get medicine continuously (constant infusion), your health care provider will place a clear bandage (dressing) over the needle site. The dressing and needle will need to be changed every week, or as told by your health care provider. What is flushing? Flushing helps keep the port from getting clogged. Follow instructions from your health care provider about how and when to flush the port. Ports are usually flushed with saline solution or a medicine called heparin. The need for flushing will  depend on how the port is used:  If the port is only used from time to time to give medicines or draw blood, the port may need to be flushed: ? Before and after medicines have been given. ? Before and after blood has been drawn. ? As part of routine maintenance. Flushing may be recommended every 4-6 weeks.  If a constant infusion is running, the port may not need to be flushed.  Throw away any syringes in a disposal container that is meant for sharp items (sharps container). You can buy a sharps container from a pharmacy, or you can make one by using an empty hard plastic bottle with a cover. How long will my port stay implanted? The port can stay in for as long as your health care provider thinks it is needed. When it is time for the port to come out, a surgery will be done to remove it. The surgery will be similar to the procedure that was done to put the port in. Follow these instructions at home:   Flush your port as told by your health care provider.  If you need an infusion over several days, follow instructions from your health care provider about how to take care of your port site. Make sure you: ? Wash your hands with soap and water before you change your dressing. If soap and water are not available, use alcohol-based hand sanitizer. ? Change your dressing as told by your health care provider. ? Place any used dressings or infusion bags into a plastic bag. Throw that bag in the trash. ? Keep the dressing that covers the needle clean and dry. Do not get it wet. ? Do not use scissors or sharp objects near the tube. ? Keep the tube clamped, unless it is being used.  Check your port site every day for signs of infection. Check for: ? Redness, swelling, or pain. ? Fluid or blood. ? Pus or a bad smell.  Protect the skin around the port site. ? Avoid wearing bra straps that rub or irritate the site. ? Protect the skin around  your port from seat belts. Place a soft pad over your  chest if needed.  Bathe or shower as told by your health care provider. The site may get wet as long as you are not actively receiving an infusion.  Return to your normal activities as told by your health care provider. Ask your health care provider what activities are safe for you.  Carry a medical alert card or wear a medical alert bracelet at all times. This will let health care providers know that you have an implanted port in case of an emergency. Get help right away if:  You have redness, swelling, or pain at the port site.  You have fluid or blood coming from your port site.  You have pus or a bad smell coming from the port site.  You have a fever. Summary  Implanted ports are usually placed in the chest for long-term IV access.  Follow instructions from your health care provider about flushing the port and changing bandages (dressings).  Take care of the area around your port by avoiding clothing that puts pressure on the area, and by watching for signs of infection.  Protect the skin around your port from seat belts. Place a soft pad over your chest if needed.  Get help right away if you have a fever or you have redness, swelling, pain, drainage, or a bad smell at the port site. This information is not intended to replace advice given to you by your health care provider. Make sure you discuss any questions you have with your health care provider. Document Revised: 05/02/2018 Document Reviewed: 02/11/2016 Elsevier Patient Education  2020 [place] After These instructions provide you with information about caring for yourself after your procedure. Your health care provider may also give you more specific instructions. Your treatment has been planned according to current medical practices, but problems sometimes occur. Call your health care provider if you have any problems or questions after your procedure. What can I expect after the  procedure? After your procedure, you may:  Feel sleepy for several hours.  Feel clumsy and have poor balance for several hours.  Feel forgetful about what happened after the procedure.  Have poor judgment for several hours.  Feel nauseous or vomit.  Have a sore throat if you had a breathing tube during the procedure. Follow these instructions at home: For at least 24 hours after the procedure:      Have a responsible adult stay with you. It is important to have someone help care for you until you are awake and alert.  Rest as needed.  Do not: ? Participate in activities in which you could fall or become injured. ? Drive. ? Use heavy machinery. ? Drink alcohol. ? Take sleeping pills or medicines that cause drowsiness. ? Make important decisions or sign legal documents. ? Take care of children on your own. Eating and drinking  Follow the diet that is recommended by your health care provider.  If you vomit, drink water, juice, or soup when you can drink without vomiting.  Make sure you have little or no nausea before eating solid foods. General instructions  Take over-the-counter and prescription medicines only as told by your health care provider.  If you have sleep apnea, surgery and certain medicines can increase your risk for breathing problems. Follow instructions from your health care provider about wearing your sleep device: ? Anytime you are sleeping, including during daytime naps. ? While  taking prescription pain medicines, sleeping medicines, or medicines that make you drowsy.  If you smoke, do not smoke without supervision.  Keep all follow-up visits as told by your health care provider. This is important. Contact a health care provider if:  You keep feeling nauseous or you keep vomiting.  You feel light-headed.  You develop a rash.  You have a fever. Get help right away if:  You have trouble breathing. Summary  For several hours after your  procedure, you may feel sleepy and have poor judgment.  Have a responsible adult stay with you for at least 24 hours or until you are awake and alert. This information is not intended to replace advice given to you by your health care provider. Make sure you discuss any questions you have with your health care provider. Document Revised: 04/08/2017 Document Reviewed: 05/01/2015 Elsevier Patient Education  [place]. How to Use Chlorhexidine for Bathing Chlorhexidine gluconate (CHG) is a germ-killing (antiseptic) solution that is used to clean the skin. It can get rid of the bacteria that normally live on the skin and can keep them away for about 24 hours. To clean your skin with CHG, you may be given:  A CHG solution to use in the shower or as part of a sponge bath.  A prepackaged cloth that contains CHG. Cleaning your skin with CHG may help lower the risk for infection:  While you are staying in the intensive care unit of the hospital.  If you have a vascular access, such as a central line, to provide short-term or long-term access to your veins.  If you have a catheter to drain urine from your bladder.  If you are on a ventilator. A ventilator is a machine that helps you breathe by moving air in and out of your lungs.  After surgery. What are the risks? Risks of using CHG include:  A skin reaction.  Hearing loss, if CHG gets in your ears.  Eye injury, if CHG gets in your eyes and is not rinsed out.  The CHG product catching fire. Make sure that you avoid smoking and flames after applying CHG to your skin. Do not use CHG:  If you have a chlorhexidine allergy or have previously reacted to chlorhexidine.  On babies younger than 63 months of age. How to use CHG solution  Use CHG only as told by your health care provider, and follow the instructions on the label.  Use the full amount of CHG as directed. Usually, this is one bottle. During a shower Follow these steps  when using CHG solution during a shower (unless your health care provider gives you different instructions): 1. Start the shower. 2. Use your normal soap and shampoo to wash your face and hair. 3. Turn off the shower or move out of the shower stream. 4. Pour the CHG onto a clean washcloth. Do not use any type of brush or rough-edged sponge. 5. Starting at your neck, lather your body down to your toes. Make sure you follow these instructions: ? If you will be having surgery, pay special attention to the part of your body where you will be having surgery. Scrub this area for at least 1 minute. ? Do not use CHG on your head or face. If the solution gets into your ears or eyes, rinse them well with water. ? Avoid your genital area. ? Avoid any areas of skin that have broken skin, cuts, or scrapes. ? Scrub your back and  under your arms. Make sure to wash skin folds. 6. Let the lather sit on your skin for 1-2 minutes or as long as told by your health care provider. 7. Thoroughly rinse your entire body in the shower. Make sure that all body creases and crevices are rinsed well. 8. Dry off with a clean towel. Do not put any substances on your body afterward--such as powder, lotion, or perfume--unless you are told to do so by your health care provider. Only use lotions that are recommended by the manufacturer. 9. Put on clean clothes or pajamas. 10. If it is the night before your surgery, sleep in clean sheets.  During a sponge bath Follow these steps when using CHG solution during a sponge bath (unless your health care provider gives you different instructions): 1. Use your normal soap and shampoo to wash your face and hair. 2. Pour the CHG onto a clean washcloth. 3. Starting at your neck, lather your body down to your toes. Make sure you follow these instructions: ? If you will be having surgery, pay special attention to the part of your body where you will be having surgery. Scrub this area for at  least 1 minute. ? Do not use CHG on your head or face. If the solution gets into your ears or eyes, rinse them well with water. ? Avoid your genital area. ? Avoid any areas of skin that have broken skin, cuts, or scrapes. ? Scrub your back and under your arms. Make sure to wash skin folds. 4. Let the lather sit on your skin for 1-2 minutes or as long as told by your health care provider. 5. Using a different clean, wet washcloth, thoroughly rinse your entire body. Make sure that all body creases and crevices are rinsed well. 6. Dry off with a clean towel. Do not put any substances on your body afterward--such as powder, lotion, or perfume--unless you are told to do so by your health care provider. Only use lotions that are recommended by the manufacturer. 7. Put on clean clothes or pajamas. 8. If it is the night before your surgery, sleep in clean sheets. How to use CHG prepackaged cloths  Only use CHG cloths as told by your health care provider, and follow the instructions on the label.  Use the CHG cloth on clean, dry skin.  Do not use the CHG cloth on your head or face unless your health care provider tells you to.  When washing with the CHG cloth: ? Avoid your genital area. ? Avoid any areas of skin that have broken skin, cuts, or scrapes. Before surgery Follow these steps when using a CHG cloth to clean before surgery (unless your health care provider gives you different instructions): 1. Using the CHG cloth, vigorously scrub the part of your body where you will be having surgery. Scrub using a back-and-forth motion for 3 minutes. The area on your body should be completely wet with CHG when you are done scrubbing. 2. Do not rinse. Discard the cloth and let the area air-dry. Do not put any substances on the area afterward, such as powder, lotion, or perfume. 3. Put on clean clothes or pajamas. 4. If it is the night before your surgery, sleep in clean sheets.  For general  bathing Follow these steps when using CHG cloths for general bathing (unless your health care provider gives you different instructions). 1. Use a separate CHG cloth for each area of your body. Make sure you wash between any  folds of skin and between your fingers and toes. Wash your body in the following order, switching to a new cloth after each step: ? The front of your neck, shoulders, and chest. ? Both of your arms, under your arms, and your hands. ? Your stomach and groin area, avoiding the genitals. ? Your right leg and foot. ? Your left leg and foot. ? The back of your neck, your back, and your buttocks. 2. Do not rinse. Discard the cloth and let the area air-dry. Do not put any substances on your body afterward--such as powder, lotion, or perfume--unless you are told to do so by your health care provider. Only use lotions that are recommended by the manufacturer. 3. Put on clean clothes or pajamas. Contact a health care provider if:  Your skin gets irritated after scrubbing.  You have questions about using your solution or cloth. Get help right away if:  Your eyes become very red or swollen.  Your eyes itch badly.  Your skin itches badly and is red or swollen.  Your hearing changes.  You have trouble seeing.  You have swelling or tingling in your mouth or throat.  You have trouble breathing.  You swallow any chlorhexidine. Summary  Chlorhexidine gluconate (CHG) is a germ-killing (antiseptic) solution that is used to clean the skin. Cleaning your skin with CHG may help to lower your risk for infection.  You may be given CHG to use for bathing. It may be in a bottle or in a prepackaged cloth to use on your skin. Carefully follow your health care provider's instructions and the instructions on the product label.  Do not use CHG if you have a chlorhexidine allergy.  Contact your health care provider if your skin gets irritated after scrubbing. This information is not  intended to replace advice given to you by your health care provider. Make sure you discuss any questions you have with your health care provider. Document Revised: 03/27/2018 Document Reviewed: 12/06/2016 Elsevier Patient Education  [place].

## 2019-04-20 ENCOUNTER — Encounter (HOSPITAL_COMMUNITY): Payer: Self-pay

## 2019-04-20 ENCOUNTER — Other Ambulatory Visit: Payer: Self-pay

## 2019-04-20 ENCOUNTER — Other Ambulatory Visit (HOSPITAL_COMMUNITY)
Admission: RE | Admit: 2019-04-20 | Discharge: 2019-04-20 | Disposition: A | Discharge status: Home / Self Care | Payer: Medicare Other | Source: Ambulatory Visit | Attending: General Surgery | Admitting: General Surgery

## 2019-04-20 ENCOUNTER — Encounter (HOSPITAL_COMMUNITY)
Admission: RE | Admit: 2019-04-20 | Discharge: 2019-04-20 | Disposition: A | Discharge status: Home / Self Care | Payer: Medicare Other | Source: Ambulatory Visit | Attending: General Surgery | Admitting: General Surgery

## 2019-04-21 ENCOUNTER — Inpatient Hospital Stay (HOSPITAL_BASED_OUTPATIENT_CLINIC_OR_DEPARTMENT_OTHER): Payer: Medicare Other | Admitting: Hematology

## 2019-04-21 ENCOUNTER — Other Ambulatory Visit: Payer: Self-pay

## 2019-04-21 ENCOUNTER — Encounter (HOSPITAL_COMMUNITY)
Admission: RE | Admit: 2019-04-21 | Discharge: 2019-04-21 | Disposition: A | Discharge status: Home / Self Care | Payer: Medicare Other | Source: Ambulatory Visit | Attending: General Surgery | Admitting: General Surgery

## 2019-04-21 ENCOUNTER — Other Ambulatory Visit (HOSPITAL_COMMUNITY)
Admission: RE | Admit: 2019-04-21 | Discharge: 2019-04-21 | Disposition: A | Discharge status: Home / Self Care | Payer: Medicare Other | Source: Ambulatory Visit | Attending: General Surgery | Admitting: General Surgery

## 2019-04-21 ENCOUNTER — Encounter (HOSPITAL_COMMUNITY): Payer: Self-pay

## 2019-04-21 ENCOUNTER — Encounter (HOSPITAL_COMMUNITY): Payer: Self-pay | Admitting: Hematology

## 2019-04-21 DIAGNOSIS — C3491 Malignant neoplasm of unspecified part of right bronchus or lung: Secondary | ICD-10-CM | POA: Diagnosis not present

## 2019-04-21 DIAGNOSIS — Z7189 Other specified counseling: Secondary | ICD-10-CM | POA: Diagnosis not present

## 2019-04-21 DIAGNOSIS — C3490 Malignant neoplasm of unspecified part of unspecified bronchus or lung: Secondary | ICD-10-CM

## 2019-04-21 DIAGNOSIS — C3411 Malignant neoplasm of upper lobe, right bronchus or lung: Secondary | ICD-10-CM | POA: Diagnosis not present

## 2019-04-21 DIAGNOSIS — Z20822 Contact with and (suspected) exposure to covid-19: Secondary | ICD-10-CM | POA: Diagnosis not present

## 2019-04-21 DIAGNOSIS — Z01812 Encounter for preprocedural laboratory examination: Secondary | ICD-10-CM | POA: Insufficient documentation

## 2019-04-21 LAB — CBC WITH DIFFERENTIAL/PLATELET
Abs Immature Granulocytes: 0.14 10*3/uL — ABNORMAL HIGH (ref 0.00–0.07)
Basophils Absolute: 0.1 10*3/uL (ref 0.0–0.1)
Basophils Relative: 1 %
Eosinophils Absolute: 0.2 10*3/uL (ref 0.0–0.5)
Eosinophils Relative: 2 %
HCT: 44.1 % (ref 39.0–52.0)
Hemoglobin: 14.1 g/dL (ref 13.0–17.0)
Immature Granulocytes: 1 %
Lymphocytes Relative: 6 %
Lymphs Abs: 0.6 10*3/uL — ABNORMAL LOW (ref 0.7–4.0)
MCH: 31.8 pg (ref 26.0–34.0)
MCHC: 32 g/dL (ref 30.0–36.0)
MCV: 99.5 fL (ref 80.0–100.0)
Monocytes Absolute: 0.9 10*3/uL (ref 0.1–1.0)
Monocytes Relative: 9 %
Neutro Abs: 8 10*3/uL — ABNORMAL HIGH (ref 1.7–7.7)
Neutrophils Relative %: 81 %
Platelets: 203 10*3/uL (ref 150–400)
RBC: 4.43 MIL/uL (ref 4.22–5.81)
RDW: 14.2 % (ref 11.5–15.5)
WBC: 9.9 10*3/uL (ref 4.0–10.5)
nRBC: 0 % (ref 0.0–0.2)

## 2019-04-21 LAB — COMPREHENSIVE METABOLIC PANEL
ALT: 37 U/L (ref 0–44)
AST: 23 U/L (ref 15–41)
Albumin: 4 g/dL (ref 3.5–5.0)
Alkaline Phosphatase: 57 U/L (ref 38–126)
Anion gap: 12 (ref 5–15)
BUN: 22 mg/dL (ref 8–23)
CO2: 26 mmol/L (ref 22–32)
Calcium: 9.6 mg/dL (ref 8.9–10.3)
Chloride: 96 mmol/L — ABNORMAL LOW (ref 98–111)
Creatinine, Ser: 1.19 mg/dL (ref 0.61–1.24)
GFR calc Af Amer: >60 mL/min (ref >60)
GFR calc non Af Amer: >60 mL/min (ref >60)
Glucose, Bld: 343 mg/dL — ABNORMAL HIGH (ref 70–99)
Potassium: 4.1 mmol/L (ref 3.5–5.1)
Sodium: 134 mmol/L — ABNORMAL LOW (ref 135–145)
Total Bilirubin: 0.6 mg/dL (ref 0.3–1.2)
Total Protein: 7.1 g/dL (ref 6.5–8.1)

## 2019-04-21 LAB — HEMOGLOBIN A1C
Hgb A1c MFr Bld: 10.4 % — ABNORMAL HIGH (ref 4.8–5.6)
Mean Plasma Glucose: 251.78 mg/dL

## 2019-04-21 LAB — SARS CORONAVIRUS 2 (TAT 6-24 HRS): SARS Coronavirus 2: NEGATIVE

## 2019-04-21 NOTE — Pre-Procedure Instructions (Signed)
In for PAT and O2 on arrival is 88 on RA. Patient has o2 with him but states, " I only use it when I am in distress and I dont feel in distress". I asked him to please put it on so we can see how his sats are normally when he wears his O2. He came up to 95% quickly with O2 on. Patient states, "I accidentally took 1 dose of Eliquis on Monday morning, but I stopped it on Saturday before that". Dr Arnoldo Morale made aware and is okay with this.

## 2019-04-21 NOTE — Patient Instructions (Signed)
[place] at [facility] Discharge Instructions  You were seen today by Dr. Delton Coombes. He went over your recent lab results. We will start treatments next week after your port placement.  He will see you back in one week for labs, treatment and follow up.   Thank you for choosing [place] at [facility] to provide your oncology and hematology care.  To afford each patient quality time with our provider, please arrive at least 15 minutes before your scheduled appointment time.   If you have a lab appointment with the [facility] please come in thru the  Main Entrance and check in at the main information desk  You need to re-schedule your appointment should you arrive 10 or more minutes late.  We strive to give you quality time with our providers, and arriving late affects you and other patients whose appointments are after yours.  Also, if you no show three or more times for appointments you may be dismissed from the clinic at the providers discretion.     Again, thank you for choosing [facility].  Our hope is that these requests will decrease the amount of time that you wait before being seen by our physicians.       _____________________________________________________________  Should you have questions after your visit to [facility], please contact our office at [phone] between the hours of 8:00 a.m. and 4:30 p.m.  Voicemails left after 4:00 p.m. will not be returned until the following business day.  For prescription refill requests, have your pharmacy contact our office and allow 72 hours.    Cancer Center Support Programs:   > Cancer Support Group  2nd Tuesday of the month 1pm-2pm, Journey Room

## 2019-04-21 NOTE — Pre-Procedure Instructions (Signed)
Glucose 363. Dr Charna Elizabeth wants patient to take full dose of insulin the night before his procedure and to make sure he has apple juice available in case his glucose drops. I called patient and he verbalized understanding of this. He states he also has glucose tabs that he can take if needed.

## 2019-04-21 NOTE — Progress Notes (Signed)
Chase Herrera [place], [place] [id number]   CLINIC:  Medical Oncology/Hematology  PCP:  [facility], [facility] [place] [address] [phone]   REASON FOR VISIT:  Extensive small cell lung cancer.  CURRENT THERAPY: Carboplatin, VP-16 and atezolizumab.  BRIEF ONCOLOGIC HISTORY:  Oncology History  Small cell lung cancer ([place])  04/03/2016 Initial Diagnosis   Small cell lung cancer ([place])   04/09/2016 Imaging   MRI brain- Negative for metastatic disease to the brain. No acute abnormality.   04/09/2016 Procedure   Right IJ port catheter placement by IR   04/13/2016 PET scan   . The right apical lung mass has significantly enlarged and there is new right paratracheal adenopathy as well as a hypermetabolic new lymph node in the right inner infraclavicular fossa potentially with early impingement on adjacent neurovascular structures. 2. Other imaging findings of potential clinical significance: Chronic bilateral maxillary sinusitis. Left carotid atherosclerotic calcification. Aortoiliac atherosclerotic vascular disease. Coronary atherosclerosis. Paraseptal emphysema. Mild atelectasis in the lung bases. Scattered sigmoid colon diverticula. Probable right pars defect at L5.   05/09/2016 - 07/12/2016 Radiation Therapy   Radiotherapy to right Lung+MS 63 Gy/35 fractions at 1.8 GY/f using 6 x photons 3- D CRT technique Dates: 4/18-6/21/18       06/19/2016 Treatment Plan Change   Cisplatin dose reduced by 20% for cycle #4 due to severe thrombocytopenia following cycle #3 (13,000).   08/21/2016 PET scan   1. Marked reduction in size and metabolic activity of RIGHT upper lobe mass. 2. Resolution of mediastinal and RIGHT supraclavicular lymphadenopathy. 3. No evidence of disease progression.   05/04/2019 -  Chemotherapy   The patient had PALONOSETRON HCL INJECTION 0.25 MG/5ML, 0.25 mg, Intravenous,  Once, 0 of 4  cycles pegfilgrastim-jmdb (FULPHILA) injection 6 mg, 6 mg, Subcutaneous,  Once, 0 of 4 cycles CARBOplatin (PARAPLATIN) in sodium chloride 0.9 % 100 mL chemo infusion, , Intravenous,  Once, 0 of 4 cycles etoposide (VEPESID) 240 mg in sodium chloride 0.9 % 1,000 mL chemo infusion, 100 mg/m2, Intravenous,  Once, 0 of 4 cycles FOSAPREPITANT IV INFUSION 150 MG, 150 mg, Intravenous,  Once, 0 of 4 cycles atezolizumab (TECENTRIQ) 1,200 mg in sodium chloride 0.9 % 250 mL chemo infusion, 1,200 mg, Intravenous, Once, 0 of 8 cycles  for chemotherapy treatment.       CANCER STAGING: Cancer Staging Small cell lung cancer ([name]) Staging form: Lung, AJCC 8th Edition - Clinical: Stage IIIC (cT3, cN3, cM0) - Signed by Chase First, MD on 04/16/2016    INTERVAL HISTORY:  Chase Herrera 69 y.o. male seen for follow-up of extensive stage small cell lung cancer.  His saturations are 93% on 2 L nasal cannula.  Evaluation requested.  Energy levels are 75%.  He reports shortness of breath on exertion at times.  Denies any greenish expectoration.  No fevers or chills reported.  He had done a CT scan of the brain.  Denies any headaches or vision changes.   REVIEW OF SYSTEMS:  Review of Systems  Respiratory: Positive for shortness of breath.   All other systems reviewed and are negative.    PAST MEDICAL/SURGICAL HISTORY:  Past Medical History:  Diagnosis Date  . Alcohol abuse    Quit 08/23/11  . Arthritis   . Atrial fibrillation ([place])   . Atrial flutter ([place])    CTI ablation by Dr Chase Herrera 10/2011  . Bacteremia due to Gram-positive bacteria 11/29/2016  . CHF (congestive heart failure) (  [place])   . COPD (chronic obstructive pulmonary disease) ([place])   . Depression   . Essential hypertension   . History of cardiomyopathy    LVEF 25-30% 08/2011 with subsequent normalization  . History of kidney stones   . Obesity   . Prostate enlargement   . Small cell lung cancer ([place])    Right upper lobe - follows with [facility]  . Type 2 diabetes mellitus ([place])    Past Surgical History:  Procedure Laterality Date  . APPENDECTOMY    . ATRIAL ABLATION SURGERY  11/06/2011   CTI ablation for atrial flutter by Dr Chase Herrera  . ATRIAL FLUTTER ABLATION N/A 11/06/2011   Procedure: ATRIAL FLUTTER ABLATION;  Surgeon: Chase Grayer, MD;  Location: [facility] CATH LAB;  Service: Cardiovascular;  Laterality: N/A;  . Cataracts    . COLONOSCOPY WITH PROPOFOL N/A 06/06/2017   Procedure: COLONOSCOPY WITH PROPOFOL;  Surgeon: Chase Dolin, MD;  Location: [facility] ENDO SUITE;  Service: Endoscopy;  Laterality: N/A;  1:15pm  . IR GENERIC HISTORICAL  04/09/2016   IR US GUIDE VASC ACCESS RIGHT 04/09/2016 Chase Mckusick, DO [facility]-INTERV RAD  . IR GENERIC HISTORICAL  04/09/2016   IR FLUORO GUIDE PORT INSERTION RIGHT 04/09/2016 Chase Mckusick, DO [facility]-INTER[facility]  . PORT-A-CATH REMOVAL N/A 04/24/2017   Procedure: MINOR REMOVAL PORT-A-CATH;  Surgeon: Chase Cagey, MD;  Location: [facility] ORS;  Service: General;  Laterality: N/A;  . PORTACATH PLACEMENT Left 04/22/2019   Procedure: INSERTION PORT-A-CATH;  Surgeon: Chase Signs, MD;  Location: [facility] ORS;  Service: General;  Laterality: Left;  . TONSILLECTOMY       SOCIAL HISTORY:  Social History   Socioeconomic History  . Marital status: Single    Spouse name: Not on file  . Number of children: Not on file  . Years of education: Not on file  . Highest education level: Not on file  Occupational History  . Occupation: retired     Comment: Building services engineer  Tobacco Use  . Smoking status: Former Smoker    Packs/day: 0.50    Years: 47.00    Pack years: 23.50    Types: Cigarettes    Quit date: 05/30/2012    Years since quitting: 6.9  . Smokeless tobacco: Former Systems developer    Quit date: 04/10/2014  . Tobacco comment: encouraged to quit today 03/04/12  Substance and Sexual Activity  . Alcohol use: No    Comment: quit 3 1/2 years ago  . Drug use: Not Currently    Comment: marajuana occasionally  . Sexual  activity: Not Currently  Other Topics Concern  . Not on file  Social History Narrative   Lives in [place] alone.   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 Difficulty of Paying Living Expenses:   Food Insecurity:   . Worried About Charity fundraiser in the Last Year:   . Arboriculturist in the Last Year:   Transportation Needs:   . Film/video editor (Medical):   Marland Kitchen Lack of Transportation (Non-Medical):   Physical Activity:   . Days of Exercise per Week:   . Minutes of Exercise per Session:   Stress:   . Feeling of Stress :   Social Connections:   . Frequency of Communication with Friends and Family:   . Frequency of Social Gatherings with Friends and Family:   .  Attends Religious Services:   . Active Member of Clubs or Organizations:   . Attends Archivist Meetings:   Marland Kitchen Marital Status:   Intimate Partner Violence:   . Fear of Current or Ex-Partner:   . Emotionally Abused:   Marland Kitchen Physically Abused:   . Sexually Abused:     FAMILY HISTORY:  Family History  Problem Relation Age of Onset  . Diabetes Sister   . Hypertension Mother   . Lung cancer Mother   . Hypertension Father   . Heart attack Father   . Hypertension Sister   . Hypertension Sister   . Hypertension Sister   . Pulmonary fibrosis Sister   . Breast cancer Cousin   . Colon cancer Neg Hx     CURRENT MEDICATIONS:  Outpatient Encounter Medications as of 04/21/2019  Medication Sig  . ACCU-CHEK Chase PLUS test strip check blood sugar TWICE DAILY  . acetaminophen (TYLENOL) 500 MG tablet Take 1,000 mg by mouth every 6 (six) hours as needed for moderate pain or headache.  Marland Kitchen apixaban (ELIQUIS) 5 MG TABS tablet Take 5 mg by mouth 2 (two) times daily.   Marland Kitchen AQUALANCE LANCETS 30G MISC USE TO check blood glucose twice daily  . Artificial Saliva (BIOTENE DRY MOUTH MOISTURIZING) SOLN Apply 1 Dose topically as  needed (dry mouth).   Marland Kitchen atorvastatin (LIPITOR) 20 MG tablet Take 1 tablet (20 mg total) by mouth daily.  . Carboxymethylcellul-Glycerin (LUBRICATING EYE DROPS OP) Place 1 drop into the left eye daily as needed (dry eyes).  Marland Kitchen diltiazem (DILACOR XR) 240 MG 24 hr capsule Take 240 mg by mouth daily.  Marland Kitchen dofetilide (TIKOSYN) 250 MCG capsule Take 1 capsule (250 mcg total) by mouth 2 (two) times daily.  . fluticasone (FLONASE) 50 MCG/ACT nasal spray Place 2 sprays into both nostrils daily. (Patient taking differently: Place 2 sprays into both nostrils daily as needed (allergies.). )  . Fluticasone-Umeclidin-Vilant (TRELEGY ELLIPTA) 100-62.5-25 MCG/INH AEPB Inhale 1 puff into the lungs daily.  . furosemide (LASIX) 40 MG tablet TAKE 1 TABLET BY MOUTH EVERY DAY (Patient taking differently: Take 40 mg by mouth daily. )  . gabapentin (NEURONTIN) 300 MG capsule Take 600 mg by mouth 4 (four) times daily.  Marland Kitchen guaifenesin (ROBITUSSIN) 100 MG/5ML syrup Take 200 mg by mouth at bedtime as needed for cough.  Marland Kitchen HYDROcodone-acetaminophen (NORCO/VICODIN) 5-325 MG tablet Take 1 tablet by mouth every 4 (four) hours as needed.  . hydrOXYzine (VISTARIL) 25 MG capsule Take 25 mg by mouth every 8 (eight) hours.  . insulin lispro (HUMALOG) 100 UNIT/ML injection Inject 2-20 Units into the skin 3 (three) times daily with meals. Sliding scale   . ipratropium-albuterol (DUONEB) 0.5-2.5 (3) MG/3ML SOLN Take 3 mLs by nebulization every 4 (four) hours as needed (shortness of breath).   Elmore Guise Devices (ADJUSTABLE LANCING DEVICE) MISC TO check blood glucose daily  . LANTUS SOLOSTAR 100 UNIT/ML Solostar Pen INJECT 20 UNITS UNDER THE SKIN EVERY MORNING AND INJECT 20 UNITS UNDER THE SKIN EVERY EVENING (Patient taking differently: Inject 20 Units into the skin 2 (two) times daily. )  . levalbuterol (XOPENEX) 0.63 MG/3ML nebulizer solution Inhale 3 mLs (0.63 mg total) into the lungs 4 (four) times daily.  . Lidocaine (SALONPAS PAIN RELIEVING  EX) Apply 1 patch topically daily.   . metFORMIN (GLUCOPHAGE) 500 MG tablet Take 500 mg by mouth in the morning and at bedtime.  . metoprolol succinate (TOPROL-XL) 100 MG 24 hr tablet Take 100 mg by  mouth daily.  . Multiple Vitamin (MULTIVITAMIN WITH MINERALS) TABS tablet Take 1 tablet by mouth daily.  . potassium chloride (KLOR-CON) 10 MEQ tablet Take 10 mEq by mouth 2 (two) times daily.  Marland Kitchen PROAIR HFA 108 (90 Base) MCG/ACT inhaler INHALE TWO PUFFS BY MOUTH EVERY 4 HOURS AS NEEDED FOR WHEEZING (Patient taking differently: Inhale 2 puffs into the lungs every 4 (four) hours as needed (wheezing/shortness of breath.). )  . senna (SENOKOT) 8.6 MG tablet Take 1 tablet by mouth in the morning and at bedtime.   Marland Kitchen spironolactone (ALDACTONE) 25 MG tablet Take 12.5 mg by mouth 2 (two) times daily.   . tamsulosin (FLOMAX) 0.4 MG CAPS capsule Take 1 capsule (0.4 mg total) by mouth 2 (two) times daily after a meal.  . tiotropium (SPIRIVA HANDIHALER) 18 MCG inhalation capsule Place 1 capsule (18 mcg total) into inhaler and inhale daily. (Patient taking differently: Place 18 mcg into inhaler and inhale daily as needed (respiratory issues.). )  . traZODone (DESYREL) 50 MG tablet Take 50 mg by mouth at bedtime.  . diazepam (VALIUM) 5 MG tablet Take 1 tablet (5 mg total) by mouth once for 1 dose. Take 1 hour prior to MRI.   No facility-administered encounter medications on file as of 04/21/2019.    ALLERGIES:  No Known Allergies   PHYSICAL EXAM:  ECOG Performance status: 1  Vitals:   04/21/19 1100  BP: (!) 129/58  Pulse: 73  Resp: 18  Temp: (!) 96.8 F (36 C)  SpO2: (!) 89%   Filed Weights   04/21/19 1100  Weight: 254 lb 7 oz (115.4 kg)    Physical Exam Vitals reviewed.  Constitutional:      Appearance: Normal appearance.  Cardiovascular:     Rate and Rhythm: Normal rate and regular rhythm.     Heart sounds: Normal heart sounds.  Pulmonary:     Effort: Pulmonary effort is normal.      Comments: Crepitations in the left lower base heard. Abdominal:     General: There is no distension.     Palpations: Abdomen is soft. There is no mass.  Lymphadenopathy:     Cervical: No cervical adenopathy.  Skin:    General: Skin is warm.  Neurological:     General: No focal deficit present.     Mental Status: He is alert and oriented to person, place, and time.  Psychiatric:        Mood and Affect: Mood normal.        Behavior: Behavior normal.      LABORATORY DATA:  I have reviewed the labs as listed.  CBC    Component Value Date/Time   WBC 9.9 04/21/2019 0942   RBC 4.43 04/21/2019 0942   HGB 14.1 04/21/2019 0942   HCT 44.1 04/21/2019 0942   PLT 203 04/21/2019 0942   MCV 99.5 04/21/2019 0942   MCH 31.8 04/21/2019 0942   MCHC 32.0 04/21/2019 0942   RDW 14.2 04/21/2019 0942   LYMPHSABS 0.6 (L) 04/21/2019 0942   MONOABS 0.9 04/21/2019 0942   EOSABS 0.2 04/21/2019 0942   BASOSABS 0.1 04/21/2019 0942   CMP Latest Ref Rng & Units 04/21/2019 03/23/2019 03/11/2019  Glucose 70 - 99 mg/dL 343(H) 346(H) -  BUN 8 - 23 mg/dL 22 22 -  Creatinine 0.61 - 1.24 mg/dL 1.19 1.32(H) 1.00  Sodium 135 - 145 mmol/L 134(L) 137 -  Potassium 3.5 - 5.1 mmol/L 4.1 4.2 -  Chloride 98 - 111 mmol/L 96(L)  98 -  CO2 22 - 32 mmol/L 26 26 -  Calcium 8.9 - 10.3 mg/dL 9.6 9.8 -  Total Protein 6.5 - 8.1 g/dL 7.1 - -  Total Bilirubin 0.3 - 1.2 mg/dL 0.6 - -  Alkaline Phos 38 - 126 U/L 57 - -  AST 15 - 41 U/L 23 - -  ALT 0 - 44 U/L 37 - -       DIAGNOSTIC IMAGING:  I have reviewed the scans.     ASSESSMENT & PLAN:   Small cell lung cancer ([place]) 1.  Recurrent small cell lung cancer: -Treated for limited stage small cell lung cancer with chemoradiation therapy completed on 06/21/2016. -He also received PCI. -We reviewed results of the scans from 04/17/2019.  CT of the head showed hypodensity on the left side of the pons, possible meta stasis.  CT scan of the chest showed worsening right lung  nodules.  Left lower lobe infectious cavitary lesion has decreased in size.  New right middle lobe lung nodule present. -I have recommended MRI of the brain based on the CT scan findings.  We will give him Valium 5 mg 1 hour prior to the scan.  He is agreeable. -He is having a port placed soon. -We talked about palliative chemotherapy with carboplatin, etoposide and atezolizumab. -He was hospitalized when he was receiving cisplatin and etoposide with severe nausea and vomiting.  We will consider dose reductions.  We will also bring him back for fluids frequently. -We talked about side effects in detail including immunotherapy and chemotherapy related side effects.  We will likely start his treatment in the next 1 to 2 weeks.  2.  Diabetes: -His diabetes seems to be not fully controlled. -His blood sugar is an A1c is 10.4. -He is on Lantus 20 units twice daily and Metformin 500 mg twice daily. -We will reach out to his primary doctor for optimization of his diabetes.  3.  Atrial fibrillation: -He is on Eliquis 5 mg twice daily.      Orders placed this encounter:  No orders of the defined types were placed in this encounter. Total time spent is 40 minutes with more than 50% of the time spent face-to-face discussing new treatment plan, side effects, counseling and coordination of care.    Derek Jack, MD [place] [phone]

## 2019-04-22 ENCOUNTER — Ambulatory Visit (HOSPITAL_COMMUNITY): Payer: Medicare Other | Admitting: Anesthesiology

## 2019-04-22 ENCOUNTER — Encounter (HOSPITAL_COMMUNITY)
Admission: RE | Disposition: A | Discharge status: Home / Self Care | Payer: Self-pay | Source: Home / Self Care | Attending: General Surgery

## 2019-04-22 ENCOUNTER — Ambulatory Visit (HOSPITAL_COMMUNITY): Payer: Medicare Other

## 2019-04-22 ENCOUNTER — Encounter (HOSPITAL_COMMUNITY): Payer: Self-pay | Admitting: General Surgery

## 2019-04-22 ENCOUNTER — Ambulatory Visit (HOSPITAL_COMMUNITY)
Admission: RE | Admit: 2019-04-22 | Discharge: 2019-04-22 | Disposition: A | Discharge status: Home / Self Care | Payer: Medicare Other | Attending: General Surgery | Admitting: General Surgery

## 2019-04-22 DIAGNOSIS — J449 Chronic obstructive pulmonary disease, unspecified: Secondary | ICD-10-CM | POA: Diagnosis not present

## 2019-04-22 DIAGNOSIS — I4892 Unspecified atrial flutter: Secondary | ICD-10-CM | POA: Diagnosis not present

## 2019-04-22 DIAGNOSIS — M1990 Unspecified osteoarthritis, unspecified site: Secondary | ICD-10-CM | POA: Insufficient documentation

## 2019-04-22 DIAGNOSIS — Z794 Long term (current) use of insulin: Secondary | ICD-10-CM | POA: Insufficient documentation

## 2019-04-22 DIAGNOSIS — I4891 Unspecified atrial fibrillation: Secondary | ICD-10-CM | POA: Diagnosis not present

## 2019-04-22 DIAGNOSIS — Z803 Family history of malignant neoplasm of breast: Secondary | ICD-10-CM | POA: Diagnosis not present

## 2019-04-22 DIAGNOSIS — Z836 Family history of other diseases of the respiratory system: Secondary | ICD-10-CM | POA: Diagnosis not present

## 2019-04-22 DIAGNOSIS — F329 Major depressive disorder, single episode, unspecified: Secondary | ICD-10-CM | POA: Diagnosis not present

## 2019-04-22 DIAGNOSIS — I509 Heart failure, unspecified: Secondary | ICD-10-CM | POA: Insufficient documentation

## 2019-04-22 DIAGNOSIS — E669 Obesity, unspecified: Secondary | ICD-10-CM | POA: Insufficient documentation

## 2019-04-22 DIAGNOSIS — Z9981 Dependence on supplemental oxygen: Secondary | ICD-10-CM | POA: Insufficient documentation

## 2019-04-22 DIAGNOSIS — I429 Cardiomyopathy, unspecified: Secondary | ICD-10-CM | POA: Diagnosis not present

## 2019-04-22 DIAGNOSIS — I739 Peripheral vascular disease, unspecified: Secondary | ICD-10-CM | POA: Insufficient documentation

## 2019-04-22 DIAGNOSIS — Z8249 Family history of ischemic heart disease and other diseases of the circulatory system: Secondary | ICD-10-CM | POA: Insufficient documentation

## 2019-04-22 DIAGNOSIS — I110 Hypertensive heart disease with heart failure: Secondary | ICD-10-CM | POA: Diagnosis not present

## 2019-04-22 DIAGNOSIS — Z7901 Long term (current) use of anticoagulants: Secondary | ICD-10-CM | POA: Insufficient documentation

## 2019-04-22 DIAGNOSIS — Z79899 Other long term (current) drug therapy: Secondary | ICD-10-CM | POA: Diagnosis not present

## 2019-04-22 DIAGNOSIS — C3491 Malignant neoplasm of unspecified part of right bronchus or lung: Secondary | ICD-10-CM | POA: Diagnosis not present

## 2019-04-22 DIAGNOSIS — Z801 Family history of malignant neoplasm of trachea, bronchus and lung: Secondary | ICD-10-CM | POA: Insufficient documentation

## 2019-04-22 DIAGNOSIS — N400 Enlarged prostate without lower urinary tract symptoms: Secondary | ICD-10-CM | POA: Diagnosis not present

## 2019-04-22 DIAGNOSIS — Z95828 Presence of other vascular implants and grafts: Secondary | ICD-10-CM

## 2019-04-22 DIAGNOSIS — Z833 Family history of diabetes mellitus: Secondary | ICD-10-CM | POA: Diagnosis not present

## 2019-04-22 DIAGNOSIS — R0602 Shortness of breath: Secondary | ICD-10-CM | POA: Diagnosis not present

## 2019-04-22 DIAGNOSIS — Z87442 Personal history of urinary calculi: Secondary | ICD-10-CM | POA: Diagnosis not present

## 2019-04-22 DIAGNOSIS — R0600 Dyspnea, unspecified: Secondary | ICD-10-CM | POA: Insufficient documentation

## 2019-04-22 DIAGNOSIS — Z87891 Personal history of nicotine dependence: Secondary | ICD-10-CM | POA: Insufficient documentation

## 2019-04-22 HISTORY — PX: PORTACATH PLACEMENT: SHX2246

## 2019-04-22 LAB — GLUCOSE, CAPILLARY: Glucose-Capillary: 227 mg/dL — ABNORMAL HIGH (ref 70–99)

## 2019-04-22 SURGERY — INSERTION, TUNNELED CENTRAL VENOUS DEVICE, WITH PORT
Anesthesia: Monitor Anesthesia Care | Site: Chest | Laterality: Left

## 2019-04-22 MED ORDER — MEPERIDINE HCL 50 MG/ML IJ SOLN: 6.2500 mg | INTRAMUSCULAR | Status: DC | PRN

## 2019-04-22 MED ORDER — CEFAZOLIN SODIUM-DEXTROSE 2-4 GM/100ML-% IV SOLN
2.0000 g | INTRAVENOUS | Status: AC
  Administered 2019-04-22: 2 g via INTRAVENOUS

## 2019-04-22 MED ORDER — HYDROMORPHONE HCL 1 MG/ML IJ SOLN: 0.2500 mg | INTRAMUSCULAR | Status: DC | PRN

## 2019-04-22 MED ORDER — HEPARIN SOD (PORK) LOCK FLUSH 100 UNIT/ML IV SOLN
INTRAVENOUS | Status: DC | PRN
  Administered 2019-04-22: 500 [IU] via INTRAVENOUS

## 2019-04-22 MED ORDER — FENTANYL CITRATE (PF) 100 MCG/2ML IJ SOLN
INTRAMUSCULAR | Status: AC
  Filled 2019-04-22: qty 2

## 2019-04-22 MED ORDER — LACTATED RINGERS IV SOLN: INTRAVENOUS | Status: DC | PRN

## 2019-04-22 MED ORDER — CHLORHEXIDINE GLUCONATE CLOTH 2 % EX PADS: 6.0000 | MEDICATED_PAD | Freq: Once | CUTANEOUS | Status: DC

## 2019-04-22 MED ORDER — IPRATROPIUM-ALBUTEROL 0.5-2.5 (3) MG/3ML IN SOLN
3.0000 mL | RESPIRATORY_TRACT | Status: DC
  Administered 2019-04-22: 3 mL via RESPIRATORY_TRACT

## 2019-04-22 MED ORDER — LIDOCAINE HCL (PF) 1 % IJ SOLN
INTRAMUSCULAR | Status: AC
  Filled 2019-04-22: qty 30

## 2019-04-22 MED ORDER — MIDAZOLAM HCL 2 MG/2ML IJ SOLN
INTRAMUSCULAR | Status: AC
  Filled 2019-04-22: qty 2

## 2019-04-22 MED ORDER — ONDANSETRON HCL 4 MG/2ML IJ SOLN: 4.0000 mg | Freq: Once | INTRAMUSCULAR | Status: DC | PRN

## 2019-04-22 MED ORDER — SODIUM CHLORIDE (PF) 0.9 % IJ SOLN
INTRAMUSCULAR | Status: DC | PRN
  Administered 2019-04-22: 10 mL via INTRAVENOUS

## 2019-04-22 MED ORDER — KETOROLAC TROMETHAMINE 30 MG/ML IJ SOLN
15.0000 mg | Freq: Once | INTRAMUSCULAR | Status: AC
  Administered 2019-04-22: 15 mg via INTRAVENOUS
  Filled 2019-04-22: qty 1

## 2019-04-22 MED ORDER — MIDAZOLAM HCL 5 MG/5ML IJ SOLN
INTRAMUSCULAR | Status: DC | PRN
  Administered 2019-04-22: 2 mg via INTRAVENOUS

## 2019-04-22 MED ORDER — LIDOCAINE HCL (PF) 1 % IJ SOLN
INTRAMUSCULAR | Status: DC | PRN
  Administered 2019-04-22: 7 mL

## 2019-04-22 MED ORDER — CEFAZOLIN SODIUM-DEXTROSE 2-4 GM/100ML-% IV SOLN
INTRAVENOUS | Status: AC
  Filled 2019-04-22: qty 100

## 2019-04-22 MED ORDER — KETAMINE HCL 50 MG/5ML IJ SOSY
PREFILLED_SYRINGE | INTRAMUSCULAR | Status: AC
  Filled 2019-04-22: qty 5

## 2019-04-22 MED ORDER — KETAMINE HCL 10 MG/ML IJ SOLN
INTRAMUSCULAR | Status: DC | PRN
  Administered 2019-04-22: 10 mg via INTRAVENOUS

## 2019-04-22 MED ORDER — FENTANYL CITRATE (PF) 100 MCG/2ML IJ SOLN
INTRAMUSCULAR | Status: DC | PRN
  Administered 2019-04-22: 100 ug via INTRAVENOUS

## 2019-04-22 MED ORDER — IPRATROPIUM-ALBUTEROL 0.5-2.5 (3) MG/3ML IN SOLN
RESPIRATORY_TRACT | Status: AC
  Filled 2019-04-22: qty 3

## 2019-04-22 MED ORDER — LACTATED RINGERS IV SOLN: Freq: Once | INTRAVENOUS | Status: AC

## 2019-04-22 MED ORDER — HEPARIN SOD (PORK) LOCK FLUSH 100 UNIT/ML IV SOLN
INTRAVENOUS | Status: AC
  Filled 2019-04-22: qty 5

## 2019-04-22 MED ORDER — CEFAZOLIN SODIUM-DEXTROSE 1-4 GM/50ML-% IV SOLN
INTRAVENOUS | Status: AC
  Filled 2019-04-22: qty 50

## 2019-04-22 SURGICAL SUPPLY — 32 items
ADH SKN CLS APL DERMABOND .7 (GAUZE/BANDAGES/DRESSINGS) ×1
APL PRP STRL LF ISPRP CHG 10.5 (MISCELLANEOUS) ×1
APPLICATOR CHLORAPREP 10.5 ORG (MISCELLANEOUS) ×2 IMPLANT
BAG DECANTER FOR FLEXI CONT (MISCELLANEOUS) ×2 IMPLANT
CLOTH BEACON ORANGE TIMEOUT ST (SAFETY) ×2 IMPLANT
COVER LIGHT HANDLE STERIS (MISCELLANEOUS) ×4 IMPLANT
COVER WAND RF STERILE (DRAPES) ×2 IMPLANT
DECANTER SPIKE VIAL GLASS SM (MISCELLANEOUS) ×2 IMPLANT
DERMABOND ADVANCED (GAUZE/BANDAGES/DRESSINGS) ×1
DERMABOND ADVANCED .7 DNX12 (GAUZE/BANDAGES/DRESSINGS) ×1 IMPLANT
DRAPE C-ARM FOLDED MOBILE STRL (DRAPES) ×2 IMPLANT
ELECT REM PT RETURN 9FT ADLT (ELECTROSURGICAL) ×2
ELECTRODE REM PT RTRN 9FT ADLT (ELECTROSURGICAL) ×1 IMPLANT
GLOVE BIO SURGEON STRL SZ7 (GLOVE) ×1 IMPLANT
GLOVE BIOGEL PI IND STRL 7.0 (GLOVE) ×2 IMPLANT
GLOVE BIOGEL PI INDICATOR 7.0 (GLOVE) ×2
GLOVE SURG SS PI 7.5 STRL IVOR (GLOVE) ×2 IMPLANT
GOWN STRL REUS W/TWL LRG LVL3 (GOWN DISPOSABLE) ×4 IMPLANT
IV NS 500ML (IV SOLUTION) ×2
IV NS 500ML BAXH (IV SOLUTION) ×1 IMPLANT
KIT PORT POWER 8FR ISP MRI (Port) ×2 IMPLANT
KIT TURNOVER KIT A (KITS) ×2 IMPLANT
NDL HYPO 25X1 1.5 SAFETY (NEEDLE) ×1 IMPLANT
NEEDLE HYPO 25X1 1.5 SAFETY (NEEDLE) ×2 IMPLANT
PACK MINOR (CUSTOM PROCEDURE TRAY) ×2 IMPLANT
PAD ARMBOARD 7.5X6 YLW CONV (MISCELLANEOUS) ×2 IMPLANT
SET BASIN LINEN APH (SET/KITS/TRAYS/PACK) ×2 IMPLANT
SUT MNCRL AB 4-0 PS2 18 (SUTURE) ×2 IMPLANT
SUT VIC AB 3-0 SH 27 (SUTURE) ×2
SUT VIC AB 3-0 SH 27X BRD (SUTURE) ×1 IMPLANT
SYR 5ML LL (SYRINGE) ×2 IMPLANT
SYR CONTROL 10ML LL (SYRINGE) ×2 IMPLANT

## 2019-04-22 NOTE — Transfer of Care (Signed)
Immediate Anesthesia Transfer of Care Note  Patient: Chase Herrera  Procedure(s) Performed: INSERTION PORT-A-CATH (Left Chest)  Patient Location: PACU  Anesthesia Type:MAC  Level of Consciousness: awake, alert , oriented and patient cooperative  Airway & Oxygen Therapy: Patient Spontanous Breathing and Patient connected to nasal cannula oxygen  Post-op Assessment: Report given to RN, Post -op Vital signs reviewed and stable and Patient moving all extremities X 4  Post vital signs: Reviewed and stable  Last Vitals:  Vitals Value Taken Time  BP    Temp    Pulse    Resp    SpO2      Last Pain:  Vitals:   04/22/19 0730  PainSc: 0-No pain         Complications: No apparent anesthesia complications

## 2019-04-22 NOTE — Discharge Instructions (Signed)

## 2019-04-22 NOTE — Anesthesia Postprocedure Evaluation (Signed)
Anesthesia Post Note  Patient: Chase Herrera  Procedure(s) Performed: INSERTION PORT-A-CATH (Left Chest)  Patient location during evaluation: PACU Anesthesia Type: MAC Level of consciousness: awake and alert Pain management: pain level controlled Vital Signs Assessment: post-procedure vital signs reviewed and stable Respiratory status: spontaneous breathing, nonlabored ventilation and respiratory function stable Cardiovascular status: stable and blood pressure returned to baseline Postop Assessment: no apparent nausea or vomiting Anesthetic complications: no     Last Vitals:  Vitals:   04/22/19 1000 04/22/19 1024  BP: 110/70 124/75  Pulse: 69 63  Resp: 18 18  Temp:  (!) 36.2 C  SpO2: 96% 92%    Last Pain:  Vitals:   04/22/19 1024  TempSrc: Axillary  PainSc: 0-No pain                 Marti Acebo

## 2019-04-22 NOTE — Anesthesia Preprocedure Evaluation (Signed)
Anesthesia Evaluation  Patient identified by MRN, date of birth, ID band Patient awake    Reviewed: Allergy & Precautions, H&P , NPO status , Patient's Chart, lab work & pertinent test results, reviewed documented beta blocker date and time   History of Anesthesia Complications Negative for: history of anesthetic complications  Airway Mallampati: II  TM Distance: >3 FB Neck ROM: full    Dental  (+) Edentulous Upper, Missing, Poor Dentition, Dental Advisory Given   Pulmonary shortness of breath, with exertion and Long-Term Oxygen Therapy, pneumonia, resolved, COPD, former smoker,    Pulmonary exam normal breath sounds clear to auscultation       Cardiovascular Exercise Tolerance: Poor hypertension, Pt. on medications + Peripheral Vascular Disease, +CHF and + DOE  + dysrhythmias Atrial Fibrillation  Rhythm:Regular Rate:Normal  Study Conclusions   - Left ventricle: The cavity size was normal. Wall thickness was normal. Systolic function was normal. The estimated ejection fraction was in the range of 60% to 65%. Left ventricular diastolic function parameters were normal.   EKG- NSR, old anterior infarct?   Neuro/Psych PSYCHIATRIC DISORDERS Depression negative neurological ROS  negative psych ROS   GI/Hepatic negative GI ROS, (+)     substance abuse  alcohol use,   Endo/Other  negative endocrine ROSdiabetes, Poorly Controlled, Type 2  Renal/GU negative Renal ROS  negative genitourinary   Musculoskeletal  (+) Arthritis ,   Abdominal   Peds  Hematology negative hematology ROS (+)   Anesthesia Other Findings ATRIAL FLUTTER ABLATION Malignant neoplasm of right lung  IMPRESSION: 1. Continued slight progression of the 2 solid right subpleural nodules in the right upper and lower lobes. Both of these were noted to be hypermetabolic on previous PET-CT. 2. Post treatment scarring in the right apex including a 2.7 x  1.2 cm nodular component, stable in size since prior study. This nodular component to the right apical opacity did show some hypermetabolism on the previous PET-CT. 3. Interval decrease in size of the consolidative cavitary opacity posterior medially in the left lower lobe since 03/11/2019 suggesting resolving infectious/inflammatory etiology. 4. New small 12 mm ground-glass opacity in the right middle lobe. Likely infectious/inflammatory. Attention on follow-up recommended. 5. Nodular liver contour suggests cirrhosis. 6.  Emphysema (ICD10-J43.9) and Aortic Atherosclerosis (ICD10-170.0)   Electronically Signed   By: Misty Stanley M.D.   On: 04/17/2019 12:58  Reproductive/Obstetrics negative OB ROS                            Anesthesia Physical  Anesthesia Plan  ASA: IV  Anesthesia Plan: MAC   Post-op Pain Management:    Induction: Intravenous  PONV Risk Score and Plan: 2 and Ondansetron  Airway Management Planned: Nasal Cannula, Natural Airway and Simple Face Mask  Additional Equipment:   Intra-op Plan:   Post-operative Plan:   Informed Consent: I have reviewed the patients History and Physical, chart, labs and discussed the procedure including the risks, benefits and alternatives for the proposed anesthesia with the patient or authorized representative who has indicated his/her understanding and acceptance.     Dental Advisory Given and Dental advisory given  Plan Discussed with: CRNA  Anesthesia Plan Comments:        Anesthesia Quick Evaluation

## 2019-04-22 NOTE — Op Note (Signed)
Patient:  Chase Herrera  DOB:  12/20/1950  MRN:  [id number]   Preop Diagnosis: Recurrent small cell carcinoma, right lung  Postop Diagnosis: Same  Procedure: Port-A-Cath insertion  Surgeon: Aviva Signs, MD  Anes: MAC  Indications: Patient is a 69 year old white male who was referred to my care by Dr. Delton Coombes of oncology for Port-A-Cath insertion.  He is about to undergo chemotherapy.  The risks and benefits of the procedure including bleeding, infection, and pneumothorax were fully explained to the patient, who gave informed consent.  Procedure note: The patient was placed in the Trendelenburg position after the left upper chest was prepped and draped using the usual sterile technique with ChloraPrep.  Surgical site confirmation was performed.  1% Xylocaine was used for local anesthesia.  An incision was made below the left clavicle.  A subcutaneous pocket was formed.  A needle was advanced into the left subclavian vein using the Seldinger technique without difficulty.  A guidewire was then advanced into the right atrium under fluoroscopic guidance.  An introducer peel-away sheath were placed over the guidewire.  The catheter was inserted through the peel-away sheath and the peel-away sheath was removed.  The catheter was then attached to the port and the port placed in subcutaneous pocket.  Adequate positioning was confirmed by fluoroscopy.  Good backflow of venous blood was noted on aspiration of the port.  The port was flushed with heparin flush.  Subcutaneous layer was reapproximated using a 3-0 Vicryl interrupted suture.  The skin was closed using a 4-0 Monocryl subcuticular suture.  Dermabond was applied.  All tape and needle counts were correct at the end of the procedure.  The patient was awakened and transferred to PACU in stable condition.  A chest x-ray will be performed at that time.  Complications: None  EBL: Minimal  Specimen:  none

## 2019-04-22 NOTE — Interval H&P Note (Signed)
History and Physical Interval Note:  04/22/2019 8:02 AM  Chase Herrera  has presented today for surgery, with the diagnosis of Small cell lung cancer, right.  The various methods of treatment have been discussed with the patient and family. After consideration of risks, benefits and other options for treatment, the patient has consented to  Procedure(s): INSERTION PORT-A-CATH (N/A) as a surgical intervention.  The patient's history has been reviewed, patient examined, no change in status, stable for surgery.  I have reviewed the patient's chart and labs.  Questions were answered to the patient's satisfaction.     Aviva Signs

## 2019-04-24 DIAGNOSIS — Z7189 Other specified counseling: Secondary | ICD-10-CM | POA: Insufficient documentation

## 2019-04-24 MED ORDER — DIAZEPAM 5 MG PO TABS: 5.0000 mg | ORAL_TABLET | Freq: Once | ORAL | 0 refills | Status: AC

## 2019-04-24 NOTE — Progress Notes (Signed)
START ON PATHWAY REGIMEN - Small Cell Lung     Cycles 1 through 4, every 21 days:     Atezolizumab      Carboplatin      Etoposide    Cycles 5 and beyond, every 21 days:     Atezolizumab   **Always confirm dose/schedule in your pharmacy ordering system**  Patient Characteristics: Newly Diagnosed, Preoperative or Nonsurgical Candidate (Clinical Staging), First Line, Extensive Stage Therapeutic Status: Newly Diagnosed, Preoperative or Nonsurgical Candidate (Clinical Staging) AJCC T Category: cTX AJCC N Category: cNX AJCC M Category: cM1a AJCC 8 Stage Grouping: IVA Stage Classification: Extensive Intent of Therapy: Non-Curative / Palliative Intent, Discussed with Patient

## 2019-04-24 NOTE — Assessment & Plan Note (Signed)
1.  Recurrent small cell lung cancer: -Treated for limited stage small cell lung cancer with chemoradiation therapy completed on 06/21/2016. -He also received PCI. -We reviewed results of the scans from 04/17/2019.  CT of the head showed hypodensity on the left side of the pons, possible meta stasis.  CT scan of the chest showed worsening right lung nodules.  Left lower lobe infectious cavitary lesion has decreased in size.  New right middle lobe lung nodule present. -I have recommended MRI of the brain based on the CT scan findings.  We will give him Valium 5 mg 1 hour prior to the scan.  He is agreeable. -He is having a port placed soon. -We talked about palliative chemotherapy with carboplatin, etoposide and atezolizumab. -He was hospitalized when he was receiving cisplatin and etoposide with severe nausea and vomiting.  We will consider dose reductions.  We will also bring him back for fluids frequently. -We talked about side effects in detail including immunotherapy and chemotherapy related side effects.  We will likely start his treatment in the next 1 to 2 weeks.  2.  Diabetes: -His diabetes seems to be not fully controlled. -His blood sugar is an A1c is 10.4. -He is on Lantus 20 units twice daily and Metformin 500 mg twice daily. -We will reach out to his primary doctor for optimization of his diabetes.  3.  Atrial fibrillation: -He is on Eliquis 5 mg twice daily.

## 2019-04-28 ENCOUNTER — Inpatient Hospital Stay (HOSPITAL_COMMUNITY): Payer: Medicare Other | Attending: Hematology | Admitting: General Practice

## 2019-04-28 ENCOUNTER — Other Ambulatory Visit (HOSPITAL_COMMUNITY): Payer: Self-pay

## 2019-04-28 ENCOUNTER — Other Ambulatory Visit: Payer: Self-pay

## 2019-04-28 DIAGNOSIS — R200 Anesthesia of skin: Secondary | ICD-10-CM | POA: Insufficient documentation

## 2019-04-28 DIAGNOSIS — Z803 Family history of malignant neoplasm of breast: Secondary | ICD-10-CM | POA: Insufficient documentation

## 2019-04-28 DIAGNOSIS — Z5112 Encounter for antineoplastic immunotherapy: Secondary | ICD-10-CM | POA: Insufficient documentation

## 2019-04-28 DIAGNOSIS — E119 Type 2 diabetes mellitus without complications: Secondary | ICD-10-CM | POA: Insufficient documentation

## 2019-04-28 DIAGNOSIS — D696 Thrombocytopenia, unspecified: Secondary | ICD-10-CM | POA: Insufficient documentation

## 2019-04-28 DIAGNOSIS — Z833 Family history of diabetes mellitus: Secondary | ICD-10-CM | POA: Insufficient documentation

## 2019-04-28 DIAGNOSIS — M7989 Other specified soft tissue disorders: Secondary | ICD-10-CM | POA: Insufficient documentation

## 2019-04-28 DIAGNOSIS — Z596 Low income: Secondary | ICD-10-CM | POA: Insufficient documentation

## 2019-04-28 DIAGNOSIS — Z801 Family history of malignant neoplasm of trachea, bronchus and lung: Secondary | ICD-10-CM | POA: Insufficient documentation

## 2019-04-28 DIAGNOSIS — Z794 Long term (current) use of insulin: Secondary | ICD-10-CM | POA: Insufficient documentation

## 2019-04-28 DIAGNOSIS — M1990 Unspecified osteoarthritis, unspecified site: Secondary | ICD-10-CM | POA: Insufficient documentation

## 2019-04-28 DIAGNOSIS — I110 Hypertensive heart disease with heart failure: Secondary | ICD-10-CM | POA: Insufficient documentation

## 2019-04-28 DIAGNOSIS — C3491 Malignant neoplasm of unspecified part of right bronchus or lung: Secondary | ICD-10-CM

## 2019-04-28 DIAGNOSIS — J449 Chronic obstructive pulmonary disease, unspecified: Secondary | ICD-10-CM | POA: Insufficient documentation

## 2019-04-28 DIAGNOSIS — Z5189 Encounter for other specified aftercare: Secondary | ICD-10-CM | POA: Insufficient documentation

## 2019-04-28 DIAGNOSIS — Z87442 Personal history of urinary calculi: Secondary | ICD-10-CM | POA: Insufficient documentation

## 2019-04-28 DIAGNOSIS — I700 Atherosclerosis of aorta: Secondary | ICD-10-CM | POA: Insufficient documentation

## 2019-04-28 DIAGNOSIS — I509 Heart failure, unspecified: Secondary | ICD-10-CM | POA: Insufficient documentation

## 2019-04-28 DIAGNOSIS — I2510 Atherosclerotic heart disease of native coronary artery without angina pectoris: Secondary | ICD-10-CM | POA: Insufficient documentation

## 2019-04-28 DIAGNOSIS — I4891 Unspecified atrial fibrillation: Secondary | ICD-10-CM | POA: Insufficient documentation

## 2019-04-28 DIAGNOSIS — C3411 Malignant neoplasm of upper lobe, right bronchus or lung: Secondary | ICD-10-CM | POA: Insufficient documentation

## 2019-04-28 DIAGNOSIS — Z836 Family history of other diseases of the respiratory system: Secondary | ICD-10-CM | POA: Insufficient documentation

## 2019-04-28 DIAGNOSIS — R531 Weakness: Secondary | ICD-10-CM | POA: Insufficient documentation

## 2019-04-28 DIAGNOSIS — Z7901 Long term (current) use of anticoagulants: Secondary | ICD-10-CM | POA: Insufficient documentation

## 2019-04-28 DIAGNOSIS — Z5111 Encounter for antineoplastic chemotherapy: Secondary | ICD-10-CM | POA: Insufficient documentation

## 2019-04-28 DIAGNOSIS — M545 Low back pain: Secondary | ICD-10-CM | POA: Insufficient documentation

## 2019-04-28 DIAGNOSIS — I429 Cardiomyopathy, unspecified: Secondary | ICD-10-CM | POA: Insufficient documentation

## 2019-04-28 DIAGNOSIS — Z8249 Family history of ischemic heart disease and other diseases of the circulatory system: Secondary | ICD-10-CM | POA: Insufficient documentation

## 2019-04-28 DIAGNOSIS — R0602 Shortness of breath: Secondary | ICD-10-CM | POA: Insufficient documentation

## 2019-04-28 DIAGNOSIS — Z79899 Other long term (current) drug therapy: Secondary | ICD-10-CM | POA: Insufficient documentation

## 2019-04-28 DIAGNOSIS — Z87891 Personal history of nicotine dependence: Secondary | ICD-10-CM | POA: Insufficient documentation

## 2019-04-28 DIAGNOSIS — M549 Dorsalgia, unspecified: Secondary | ICD-10-CM | POA: Insufficient documentation

## 2019-04-28 NOTE — Progress Notes (Signed)
[facility] Initial Psychosocial Assessment Clinical Social Work  Clinical Social Work met w patient in person to assess psychosocial, emotional, mental health, and spiritual needs of the patient.   Barriers to care/review of distress screen:  - Transportation:  Do you anticipate any problems getting to appointments?  Do you have someone who can help run errands for you if you need it?  Drives himself to all errands and appointments.  Hopes to be able to continue to do this throughout treatment.  States he is able to get in/out of house as needed but does not have a ramp.  Not using walker, cane or wheelchair at this point.  However, his ability to ambulate is severely limited thus he uses scooter or wheelchair when possible for errands/appointments.  Reports he is able to afford gas needed.  Has used [employer] as part of his [employer] Medicare transportation benefit in the past, aware of this process if he needs it.   - Help at home:  What is your living situation (alone, family, other)?  If you are physically unable to care for yourself, who would you call on to help you?  Lives alone, has neighbors who did check on him.  However, neighbor has recently developed throat cancer and will be less available.  States he is in regular telephone contact w two sisters, but they both have individual or family health problems that inhibit their ability to be helpful in practical ways.  He is unsure about his church - has been a long time member, but staff has recently changed and he does not know the new preacher.   - Support system:  What does your support system look like?  Who would you call on if you needed some kind of practical help?  What if you needed someone to talk to for emotional support?  Knows many people as he was raised in [place] and worked in the textile mill and a convenience store.  Church member.  However, support is limited at this point since his sisters are involved in caring for their own or spouses  health issues.  Does not have much contact w youngest sister.  These are the only living family members.  Mother is deceased - raised 5 children.  He does have a step mother who is "like a mother to me" but she is also elderly. - Finances:  Are you concerned about finances.  Considering returning to work?  If not, applying for disability?  Gets small pension and government retirement funds.  Has [employer] Medicare - last year, plan pays more than it does this year.  Has tried to apply for Medicaid, submitting bills needed to reduce his income to below eligibility levels but "I sent them all these bills and I never heard anything back from them."  Has enough money to pay his bills during the month, is not behind.  Gets Food Stamps.  States that his money lasts, but barely, through the month as long as there are no unexpected expenses.    What is your understanding of where you are with your cancer? Its cause?  Your treatment plan and what happens next?  intially diagnosed w lung cancer several years ago - was found after multiple ED visits for breathing issues.  Had x-rays and CT Scans - one of which finally revealed Stage III lung cancer.  Was treated w chemothearpy and radiation. Chemotherapy left him significantly weakened after each treatment - he ended up in the hospital due to   extreme weakness several times.  Radiation caused a bleeding esophagas, he has been told that he cannot have radiation this time.  Was hospitalized earlier this year for blood clot in one lung and an abcess in another - spent time at [facility] in [place] as well as treatment at [facility].  "They dont know what caused the blood clot or the abcess, I was finished w my cancer treatments."  Has multiple health issues  - COPD, diabetes, A Fib, CHF.  States that he has trouble managing his diabetes because "I like sweets."  Cooks for himself using tabletop grill, crock pot and similar.  Eats healthy "50% of the time", struggles to find things that are sweet  that he can eat without increasing his blood sugar.  States he quit alcohol and smoking "cold [place]" w God's help.  Is hopeful he will be able to quit eating sweets the same way.  What are your worries for the future as you begin treatment for cancer?  Hopes that his chemo regimen will be different than last time, concerned he will be hospitalized often like he was last time due to weakness secondary to chemotherapy.    What are your hopes and priorities during your treatment? What is important to you? What are your goals for your care?  Remaining independent and able to care for himself/drive.    CSW Summary:  Patient and family psychosocial functioning including strengths, limitations, and coping skills:  69 yo male, diagnosed w reoccurrence of lung cancer found during surveillance scans.  Hopes that "they caught it early enough so it can be stopped like it was last time."  Lives alone, independent in all his ADLs.  Limited in ability to walk any distance due to fatigue.  No home health at this time - had "[facility]" after last SNF discharge.  Gets oxygen from [place].  PCP is at [facility] in [place].  Is seen regularly.  No financial concerns, but does want to consider help w meals during chemotherapy as he will likely be less able to shop and cook due to fatigue.  Little external support.  Identifications of barriers to care:  Little support, multiple chronic health issues  Availability of community resources:  Refer to Government social research officer for [employer] program.    Clinical Social Worker follow up needed: No.  Please reconsult if needs arise.    Edwyna Shell, LCSW Clinical Social Worker Phone:  [phone] Cell:  [phone]

## 2019-04-29 ENCOUNTER — Encounter (HOSPITAL_COMMUNITY): Payer: Self-pay | Admitting: General Practice

## 2019-04-29 NOTE — Progress Notes (Signed)
[facility] CSW Progress Notes  Email from Johnnette Barrios - patient has been enrolled in senior Meals program through [employer] [facility], will begin receiving meals tomorrow 04/30/19.  Edwyna Shell, LCSW Clinical Social Worker Phone:  [phone] Cell:  [phone]

## 2019-05-05 ENCOUNTER — Other Ambulatory Visit (HOSPITAL_COMMUNITY): Payer: Self-pay

## 2019-05-05 DIAGNOSIS — C3491 Malignant neoplasm of unspecified part of right bronchus or lung: Secondary | ICD-10-CM

## 2019-05-06 ENCOUNTER — Encounter (HOSPITAL_COMMUNITY): Payer: Self-pay | Admitting: Hematology

## 2019-05-06 ENCOUNTER — Inpatient Hospital Stay (HOSPITAL_COMMUNITY): Payer: Medicare Other

## 2019-05-06 ENCOUNTER — Ambulatory Visit (HOSPITAL_COMMUNITY): Payer: Medicare Other

## 2019-05-06 ENCOUNTER — Inpatient Hospital Stay (HOSPITAL_COMMUNITY): Payer: Medicare Other | Admitting: Hematology

## 2019-05-06 ENCOUNTER — Other Ambulatory Visit: Payer: Self-pay

## 2019-05-06 VITALS — BP 117/64 | HR 72 | Temp 97.5°F | Resp 20

## 2019-05-06 DIAGNOSIS — E119 Type 2 diabetes mellitus without complications: Secondary | ICD-10-CM | POA: Diagnosis not present

## 2019-05-06 DIAGNOSIS — C3491 Malignant neoplasm of unspecified part of right bronchus or lung: Secondary | ICD-10-CM

## 2019-05-06 DIAGNOSIS — I4891 Unspecified atrial fibrillation: Secondary | ICD-10-CM | POA: Diagnosis not present

## 2019-05-06 DIAGNOSIS — J449 Chronic obstructive pulmonary disease, unspecified: Secondary | ICD-10-CM | POA: Diagnosis not present

## 2019-05-06 DIAGNOSIS — Z7901 Long term (current) use of anticoagulants: Secondary | ICD-10-CM | POA: Diagnosis not present

## 2019-05-06 DIAGNOSIS — Z596 Low income: Secondary | ICD-10-CM | POA: Diagnosis not present

## 2019-05-06 DIAGNOSIS — M7989 Other specified soft tissue disorders: Secondary | ICD-10-CM | POA: Diagnosis not present

## 2019-05-06 DIAGNOSIS — M545 Low back pain: Secondary | ICD-10-CM | POA: Diagnosis not present

## 2019-05-06 DIAGNOSIS — C3490 Malignant neoplasm of unspecified part of unspecified bronchus or lung: Secondary | ICD-10-CM | POA: Diagnosis not present

## 2019-05-06 DIAGNOSIS — Z5111 Encounter for antineoplastic chemotherapy: Secondary | ICD-10-CM | POA: Diagnosis present

## 2019-05-06 DIAGNOSIS — Z5189 Encounter for other specified aftercare: Secondary | ICD-10-CM | POA: Diagnosis not present

## 2019-05-06 DIAGNOSIS — R531 Weakness: Secondary | ICD-10-CM | POA: Diagnosis not present

## 2019-05-06 DIAGNOSIS — R0602 Shortness of breath: Secondary | ICD-10-CM | POA: Diagnosis not present

## 2019-05-06 DIAGNOSIS — Z79899 Other long term (current) drug therapy: Secondary | ICD-10-CM | POA: Diagnosis not present

## 2019-05-06 DIAGNOSIS — Z794 Long term (current) use of insulin: Secondary | ICD-10-CM | POA: Diagnosis not present

## 2019-05-06 DIAGNOSIS — M549 Dorsalgia, unspecified: Secondary | ICD-10-CM | POA: Diagnosis not present

## 2019-05-06 DIAGNOSIS — I2510 Atherosclerotic heart disease of native coronary artery without angina pectoris: Secondary | ICD-10-CM | POA: Diagnosis not present

## 2019-05-06 DIAGNOSIS — D696 Thrombocytopenia, unspecified: Secondary | ICD-10-CM | POA: Diagnosis not present

## 2019-05-06 DIAGNOSIS — M1990 Unspecified osteoarthritis, unspecified site: Secondary | ICD-10-CM | POA: Diagnosis not present

## 2019-05-06 DIAGNOSIS — C3411 Malignant neoplasm of upper lobe, right bronchus or lung: Secondary | ICD-10-CM | POA: Diagnosis present

## 2019-05-06 DIAGNOSIS — Z5112 Encounter for antineoplastic immunotherapy: Secondary | ICD-10-CM | POA: Diagnosis not present

## 2019-05-06 DIAGNOSIS — I110 Hypertensive heart disease with heart failure: Secondary | ICD-10-CM | POA: Diagnosis not present

## 2019-05-06 DIAGNOSIS — R200 Anesthesia of skin: Secondary | ICD-10-CM | POA: Diagnosis not present

## 2019-05-06 DIAGNOSIS — I429 Cardiomyopathy, unspecified: Secondary | ICD-10-CM | POA: Diagnosis not present

## 2019-05-06 DIAGNOSIS — Z95828 Presence of other vascular implants and grafts: Secondary | ICD-10-CM

## 2019-05-06 DIAGNOSIS — I509 Heart failure, unspecified: Secondary | ICD-10-CM | POA: Diagnosis not present

## 2019-05-06 DIAGNOSIS — I700 Atherosclerosis of aorta: Secondary | ICD-10-CM | POA: Diagnosis not present

## 2019-05-06 LAB — COMPREHENSIVE METABOLIC PANEL
ALT: 49 U/L — ABNORMAL HIGH (ref 0–44)
AST: 29 U/L (ref 15–41)
Albumin: 3.6 g/dL (ref 3.5–5.0)
Alkaline Phosphatase: 59 U/L (ref 38–126)
Anion gap: 12 (ref 5–15)
BUN: 20 mg/dL (ref 8–23)
CO2: 26 mmol/L (ref 22–32)
Calcium: 9 mg/dL (ref 8.9–10.3)
Chloride: 95 mmol/L — ABNORMAL LOW (ref 98–111)
Creatinine, Ser: 1.37 mg/dL — ABNORMAL HIGH (ref 0.61–1.24)
GFR calc Af Amer: >60 mL/min (ref >60)
GFR calc non Af Amer: 53 mL/min — ABNORMAL LOW (ref >60)
Glucose, Bld: 436 mg/dL — ABNORMAL HIGH (ref 70–99)
Potassium: 4.7 mmol/L (ref 3.5–5.1)
Sodium: 133 mmol/L — ABNORMAL LOW (ref 135–145)
Total Bilirubin: 0.7 mg/dL (ref 0.3–1.2)
Total Protein: 6.4 g/dL — ABNORMAL LOW (ref 6.5–8.1)

## 2019-05-06 LAB — CBC WITH DIFFERENTIAL/PLATELET
Abs Immature Granulocytes: 0.16 10*3/uL — ABNORMAL HIGH (ref 0.00–0.07)
Basophils Absolute: 0 10*3/uL (ref 0.0–0.1)
Basophils Relative: 1 %
Eosinophils Absolute: 0.1 10*3/uL (ref 0.0–0.5)
Eosinophils Relative: 1 %
HCT: 44.1 % (ref 39.0–52.0)
Hemoglobin: 14 g/dL (ref 13.0–17.0)
Immature Granulocytes: 2 %
Lymphocytes Relative: 6 %
Lymphs Abs: 0.5 10*3/uL — ABNORMAL LOW (ref 0.7–4.0)
MCH: 31.5 pg (ref 26.0–34.0)
MCHC: 31.7 g/dL (ref 30.0–36.0)
MCV: 99.3 fL (ref 80.0–100.0)
Monocytes Absolute: 0.7 10*3/uL (ref 0.1–1.0)
Monocytes Relative: 8 %
Neutro Abs: 7.1 10*3/uL (ref 1.7–7.7)
Neutrophils Relative %: 82 %
Platelets: 136 10*3/uL — ABNORMAL LOW (ref 150–400)
RBC: 4.44 MIL/uL (ref 4.22–5.81)
RDW: 14.2 % (ref 11.5–15.5)
WBC: 8.5 10*3/uL (ref 4.0–10.5)
nRBC: 0 % (ref 0.0–0.2)

## 2019-05-06 MED ORDER — SODIUM CHLORIDE 0.9 % IV SOLN: Freq: Once | INTRAVENOUS | Status: AC

## 2019-05-06 MED ORDER — SODIUM CHLORIDE 0.9 % IV SOLN
150.0000 mg | Freq: Once | INTRAVENOUS | Status: AC
  Administered 2019-05-06: 150 mg via INTRAVENOUS
  Filled 2019-05-06: qty 150

## 2019-05-06 MED ORDER — SODIUM CHLORIDE 0.9 % IV SOLN
10.0000 mg | Freq: Once | INTRAVENOUS | Status: AC
  Administered 2019-05-06: 10 mg via INTRAVENOUS
  Filled 2019-05-06: qty 10

## 2019-05-06 MED ORDER — SODIUM CHLORIDE 0.9% FLUSH
10.0000 mL | INTRAVENOUS | Status: DC | PRN
  Administered 2019-05-06: 10 mL

## 2019-05-06 MED ORDER — HEPARIN SOD (PORK) LOCK FLUSH 100 UNIT/ML IV SOLN
500.0000 [IU] | Freq: Once | INTRAVENOUS | Status: AC | PRN
  Administered 2019-05-06: 500 [IU]

## 2019-05-06 MED ORDER — LIDOCAINE-PRILOCAINE 2.5-2.5 % EX CREA: TOPICAL_CREAM | CUTANEOUS | 3 refills | Status: DC

## 2019-05-06 MED ORDER — SODIUM CHLORIDE 0.9 % IV SOLN
1200.0000 mg | Freq: Once | INTRAVENOUS | Status: AC
  Administered 2019-05-06: 11:00:00 1200 mg via INTRAVENOUS
  Filled 2019-05-06: qty 20

## 2019-05-06 MED ORDER — SODIUM CHLORIDE 0.9 % IV SOLN
80.0000 mg/m2 | Freq: Once | INTRAVENOUS | Status: AC
  Administered 2019-05-06: 190 mg via INTRAVENOUS
  Filled 2019-05-06: qty 9.5

## 2019-05-06 MED ORDER — PALONOSETRON HCL INJECTION 0.25 MG/5ML
0.2500 mg | Freq: Once | INTRAVENOUS | Status: AC
  Administered 2019-05-06: 0.25 mg via INTRAVENOUS
  Filled 2019-05-06: qty 5

## 2019-05-06 MED ORDER — PROCHLORPERAZINE MALEATE 10 MG PO TABS: 10.0000 mg | ORAL_TABLET | Freq: Four times a day (QID) | ORAL | 1 refills | Status: DC | PRN

## 2019-05-06 MED ORDER — SODIUM CHLORIDE 0.9 % IV SOLN
446.4000 mg | Freq: Once | INTRAVENOUS | Status: AC
  Administered 2019-05-06: 450 mg via INTRAVENOUS
  Filled 2019-05-06: qty 45

## 2019-05-06 NOTE — Patient Instructions (Addendum)
[place] at [facility] Discharge Instructions  You were seen today by Dr. Delton Coombes. He went over your recent lab results. You will return Monday for lab, fluids and an injection. He will see you back in 2 weeks for labs and follow up.   Thank you for choosing [place] at [facility] to provide your oncology and hematology care.  To afford each patient quality time with our provider, please arrive at least 15 minutes before your scheduled appointment time.   If you have a lab appointment with the [facility] please come in thru the  Main Entrance and check in at the main information desk  You need to re-schedule your appointment should you arrive 10 or more minutes late.  We strive to give you quality time with our providers, and arriving late affects you and other patients whose appointments are after yours.  Also, if you no show three or more times for appointments you may be dismissed from the clinic at the providers discretion.     Again, thank you for choosing [facility].  Our hope is that these requests will decrease the amount of time that you wait before being seen by our physicians.       _____________________________________________________________  Should you have questions after your visit to [facility], please contact our office at [phone] between the hours of 8:00 a.m. and 4:30 p.m.  Voicemails left after 4:00 p.m. will not be returned until the following business day.  For prescription refill requests, have your pharmacy contact our office and allow 72 hours.    Cancer Center Support Programs:   > Cancer Support Group  2nd Tuesday of the month 1pm-2pm, Journey Room

## 2019-05-06 NOTE — Progress Notes (Signed)
Chemotherapy/immunotherapy education packet given and discussed with pt in detail.  Discussed diagnosis, staging, tx regimen, and intent of tx.  Reviewed chemotherapy/immunotherapy medications and side effects, as well as pre-medications.  Instructed on how to manage side effects at home, and when to call the clinic.  Importance of fever/chills discussed with pt. Discussed precautions to implement at home after receiving tx, as well as self care strategies. Phone numbers provided for clinic during regular working hours, also how to reach the clinic after hours and on weekends. Pt provided the opportunity to ask questions - all questions answered to pt's satisfaction.

## 2019-05-06 NOTE — Progress Notes (Signed)
Chase Herrera, [place] [address]   CLINIC:  Medical Oncology/Hematology  PCP:  [facility], [facility] [place] [address] [phone]   REASON FOR VISIT:  Extensive small cell lung cancer.  CURRENT THERAPY: Carboplatin, VP-16 and atezolizumab.  BRIEF ONCOLOGIC HISTORY:  Oncology History  Small cell lung cancer ([place])  04/03/2016 Initial Diagnosis   Small cell lung cancer ([place])   04/09/2016 Imaging   MRI brain- Negative for metastatic disease to the brain. No acute abnormality.   04/09/2016 Procedure   Right IJ port catheter placement by IR   04/13/2016 PET scan   . The right apical lung mass has significantly enlarged and there is new right paratracheal adenopathy as well as a hypermetabolic new lymph node in the right inner infraclavicular fossa potentially with early impingement on adjacent neurovascular structures. 2. Other imaging findings of potential clinical significance: Chronic bilateral maxillary sinusitis. Left carotid atherosclerotic calcification. Aortoiliac atherosclerotic vascular disease. Coronary atherosclerosis. Paraseptal emphysema. Mild atelectasis in the lung bases. Scattered sigmoid colon diverticula. Probable right pars defect at L5.   05/09/2016 - 07/12/2016 Radiation Therapy   Radiotherapy to right Lung+MS 63 Gy/35 fractions at 1.8 GY/f using 6 x photons 3- D CRT technique Dates: 4/18-6/21/18       06/19/2016 Treatment Plan Change   Cisplatin dose reduced by 20% for cycle #4 due to severe thrombocytopenia following cycle #3 (13,000).   08/21/2016 PET scan   1. Marked reduction in size and metabolic activity of RIGHT upper lobe mass. 2. Resolution of mediastinal and RIGHT supraclavicular lymphadenopathy. 3. No evidence of disease progression.   05/06/2019 -  Chemotherapy   The patient had palonosetron (ALOXI) injection 0.25 mg, 0.25 mg, Intravenous,  Once, 1 of 4  cycles pegfilgrastim-cbqv (UDENYCA) injection 6 mg, 6 mg, Subcutaneous, Once, 1 of 4 cycles CARBOplatin (PARAPLATIN) 450 mg in sodium chloride 0.9 % 250 mL chemo infusion, 450 mg (100 % of original dose 446.4 mg), Intravenous,  Once, 1 of 4 cycles Dose modification:   (original dose 446.4 mg, Cycle 1, Reason: Provider Judgment) etoposide (VEPESID) 190 mg in sodium chloride 0.9 % 500 mL chemo infusion, 80 mg/m2 = 190 mg (80 % of original dose 100 mg/m2), Intravenous,  Once, 1 of 4 cycles Dose modification: 80 mg/m2 (80 % of original dose 100 mg/m2, Cycle 1, Reason: Provider Judgment) fosaprepitant (EMEND) 150 mg in sodium chloride 0.9 % 145 mL IVPB, 150 mg, Intravenous,  Once, 1 of 4 cycles atezolizumab (TECENTRIQ) 1,200 mg in sodium chloride 0.9 % 250 mL chemo infusion, 1,200 mg, Intravenous, Once, 1 of 8 cycles  for chemotherapy treatment.       CANCER STAGING: Cancer Staging Small cell lung cancer ([place]) Staging form: Lung, AJCC 8th Edition - Clinical: Stage IIIC (cT3, cN3, cM0) - Signed by Twana First, MD on 04/16/2016    INTERVAL HISTORY:  Chase Herrera 69 y.o. male seen for follow-up of extensive stage small cell lung cancer.  He had port placed.  He reports decreased energy levels.  Appetite is 75%.  Reports cough and shortness of breath.  He reports mostly clear sputum and very rarely greenish sputum.  Feet swelling is also chronic and stable.  Numbness in the hands is stable.  Denies any fevers or chills.   REVIEW OF SYSTEMS:  Review of Systems  Respiratory: Positive for cough and shortness of breath.   Cardiovascular: Positive for leg swelling.  Neurological: Positive for numbness.  All  other systems reviewed and are negative.    PAST MEDICAL/SURGICAL HISTORY:  Past Medical History:  Diagnosis Date  . Alcohol abuse    Quit 08/23/11  . Arthritis   . Atrial fibrillation ([place])   . Atrial flutter ([place])    CTI ablation by Dr [name] 10/2011  . Bacteremia due to Gram-positive  bacteria 11/29/2016  . CHF (congestive heart failure) ([place])   . COPD (chronic obstructive pulmonary disease) ([place])   . Depression   . Essential hypertension   . History of cardiomyopathy    LVEF 25-30% 08/2011 with subsequent normalization  . History of kidney stones   . Obesity   . Prostate enlargement   . Small cell lung cancer ([place])    Right upper lobe - follows with [facility]  . Type 2 diabetes mellitus ([place])    Past Surgical History:  Procedure Laterality Date  . APPENDECTOMY    . ATRIAL ABLATION SURGERY  11/06/2011   CTI ablation for atrial flutter by Dr [name]  . ATRIAL FLUTTER ABLATION N/A 11/06/2011   Procedure: ATRIAL FLUTTER ABLATION;  Surgeon: Thompson Grayer, MD;  Location: [facility] CATH LAB;  Service: Cardiovascular;  Laterality: N/A;  . Cataracts    . COLONOSCOPY WITH PROPOFOL N/A 06/06/2017   Procedure: COLONOSCOPY WITH PROPOFOL;  Surgeon: [name], MD;  Location: [facility] ENDO SUITE;  Service: Endoscopy;  Laterality: N/A;  1:15pm  . IR GENERIC HISTORICAL  04/09/2016   IR US GUIDE VASC ACCESS RIGHT 04/09/2016 [name], DO [facility]-INTERV RAD  . IR GENERIC HISTORICAL  04/09/2016   IR FLUORO GUIDE PORT INSERTION RIGHT 04/09/2016 [name], DO [facility]-INTERV RAD  . PORT-A-CATH REMOVAL N/A 04/24/2017   Procedure: MINOR REMOVAL PORT-A-CATH;  Surgeon: [name], MD;  Location: [facility] ORS;  Service: General;  Laterality: N/A;  . PORTACATH PLACEMENT Left 04/22/2019   Procedure: INSERTION PORT-A-CATH;  Surgeon: Aviva Signs, MD;  Location: [facility] ORS;  Service: General;  Laterality: Left;  . TONSILLECTOMY       SOCIAL HISTORY:  Social History   Socioeconomic History  . Marital status: Single    Spouse name: Not on file  . Number of children: Not on file  . Years of education: Not on file  . Highest education level: Not on file  Occupational History  . Occupation: retired     Comment: Building services engineer  Tobacco Use  . Smoking status: Former Smoker    Packs/day: 0.50    Years:  47.00    Pack years: 23.50    Types: Cigarettes    Quit date: 05/30/2012    Years since quitting: 6.9  . Smokeless tobacco: Former Systems developer    Quit date: 04/10/2014  . Tobacco comment: encouraged to quit today 03/04/12  Substance and Sexual Activity  . Alcohol use: No    Comment: quit 3 1/2 years ago  . Drug use: Not Currently    Comment: marajuana occasionally  . Sexual activity: Not Currently  Other Topics Concern  . Not on file  Social History Narrative   Lives in [place] alone.   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 Difficulty of Paying Living Expenses:   Food Insecurity:   . Worried About Charity fundraiser in the Last Year:   . Arboriculturist in the Last Year:   Transportation Needs:   . Lack of Transportation (  Medical):   Marland Kitchen Lack of Transportation (Non-Medical):   Physical Activity:   . Days of Exercise per Week:   . Minutes of Exercise per Session:   Stress:   . Feeling of Stress :   Social Connections:   . Frequency of Communication with Friends and Family:   . Frequency of Social Gatherings with Friends and Family:   . Attends Religious Services:   . Active Member of Clubs or Organizations:   . Attends Archivist Meetings:   Marland Kitchen Marital Status:   Intimate Partner Violence:   . Fear of Current or Ex-Partner:   . Emotionally Abused:   Mar[place] Kitchen Physically Abused:   . Sexually Abused:     FAMILY HISTORY:  Family History  Problem Relation Age of Onset  . Diabetes Sister   . Hypertension Mother   . Lung cancer Mother   . Hypertension Father   . Heart attack Father   . Hypertension Sister   . Hypertension Sister   . Hypertension Sister   . Pulmonary fibrosis Sister   . Breast cancer Cousin   . Colon cancer Neg Hx     CURRENT MEDICATIONS:  Outpatient Encounter Medications as of 05/06/2019  Medication Sig  . ACCU-CHEK AVIVA PLUS test strip check blood sugar  TWICE DAILY  . acetaminophen (TYLENOL) 500 MG tablet Take 1,000 mg by mouth every 6 (six) hours as needed for moderate pain or headache.  Marland Kitchen apixaban (ELIQUIS) 5 MG TABS tablet Take 5 mg by mouth 2 (two) times daily.   Marland Kitchen AQUALANCE LANCETS 30G MISC USE TO check blood glucose twice daily  . Artificial Saliva (BIOTENE DRY MOUTH MOISTURIZING) SOLN Apply 1 Dose topically as needed (dry mouth).   Marland Kitchen atorvastatin (LIPITOR) 20 MG tablet Take 1 tablet (20 mg total) by mouth daily.  . Carboxymethylcellul-Glycerin (LUBRICATING EYE DROPS OP) Place 1 drop into the left eye daily as needed (dry eyes).  . diazepam (VALIUM) 5 MG tablet Take 5 mg by mouth once. For MRI  . diltiazem (DILACOR XR) 240 MG 24 hr capsule Take 240 mg by mouth daily.  Marland Kitchen dofetilide (TIKOSYN) 250 MCG capsule Take 1 capsule (250 mcg total) by mouth 2 (two) times daily.  . fluticasone (FLONASE) 50 MCG/ACT nasal spray Place 2 sprays into both nostrils daily. (Patient taking differently: Place 2 sprays into both nostrils daily as needed (allergies.). )  . Fluticasone-Umeclidin-Vilant (TRELEGY ELLIPTA) 100-62.5-25 MCG/INH AEPB Inhale 1 puff into the lungs daily.  . furosemide (LASIX) 40 MG tablet TAKE 1 TABLET BY MOUTH EVERY DAY (Patient taking differently: Take 40 mg by mouth daily. )  . gabapentin (NEURONTIN) 300 MG capsule Take 600 mg by mouth 2 (two) times daily.   Marland Kitchen guaifenesin (ROBITUSSIN) 100 MG/5ML syrup Take 200 mg by mouth at bedtime as needed for cough.  Marland Kitchen HYDROcodone-acetaminophen (NORCO/VICODIN) 5-325 MG tablet Take 1 tablet by mouth every 4 (four) hours as needed.  . hydrOXYzine (VISTARIL) 25 MG capsule Take 25 mg by mouth every 8 (eight) hours.  . insulin lispro (HUMALOG) 100 UNIT/ML injection Inject 2-20 Units into the skin 3 (three) times daily with meals. Sliding scale   . ipratropium-albuterol (DUONEB) 0.5-2.5 (3) MG/3ML SOLN Take 3 mLs by nebulization every 4 (four) hours as needed (shortness of breath).   Elmore Guise Devices  (ADJUSTABLE LANCING DEVICE) MISC TO check blood glucose daily  . LANTUS SOLOSTAR 100 UNIT/ML Solostar Pen INJECT 20 UNITS UNDER THE SKIN EVERY MORNING AND INJECT 20 UNITS UNDER THE SKIN  EVERY EVENING (Patient taking differently: Inject 20 Units into the skin 2 (two) times daily. )  . levalbuterol (XOPENEX) 0.63 MG/3ML nebulizer solution Inhale 3 mLs (0.63 mg total) into the lungs 4 (four) times daily.  . Lidocaine (SALONPAS PAIN RELIEVING EX) Apply 1 patch topically daily.   Marland Kitchen lidocaine-prilocaine (EMLA) cream Apply a small amount to port a cath site and cover with plastic wrap 1 hour prior to chemotherapy appointments  . metFORMIN (GLUCOPHAGE) 500 MG tablet Take 500 mg by mouth in the morning and at bedtime.  . metoprolol succinate (TOPROL-XL) 100 MG 24 hr tablet Take 100 mg by mouth daily.  . Multiple Vitamin (MULTIVITAMIN WITH MINERALS) TABS tablet Take 1 tablet by mouth daily.  . potassium chloride (KLOR-CON) 10 MEQ tablet Take 10 mEq by mouth 2 (two) times daily.  Marland Kitchen PROAIR HFA 108 (90 Base) MCG/ACT inhaler INHALE TWO PUFFS BY MOUTH EVERY 4 HOURS AS NEEDED FOR WHEEZING (Patient taking differently: Inhale 2 puffs into the lungs every 4 (four) hours as needed (wheezing/shortness of breath.). )  . prochlorperazine (COMPAZINE) 10 MG tablet Take 1 tablet (10 mg total) by mouth every 6 (six) hours as needed (Nausea or vomiting).  Marland Kitchen senna (SENOKOT) 8.6 MG tablet Take 1 tablet by mouth in the morning and at bedtime.   Marland Kitchen spironolactone (ALDACTONE) 25 MG tablet Take 12.5 mg by mouth 2 (two) times daily.   . tamsulosin (FLOMAX) 0.4 MG CAPS capsule Take 1 capsule (0.4 mg total) by mouth 2 (two) times daily after a meal.  . tiotropium (SPIRIVA HANDIHALER) 18 MCG inhalation capsule Place 1 capsule (18 mcg total) into inhaler and inhale daily. (Patient taking differently: Place 18 mcg into inhaler and inhale daily as needed (respiratory issues.). )  . traZODone (DESYREL) 50 MG tablet Take 50 mg by mouth at  bedtime.  . [DISCONTINUED] predniSONE (DELTASONE) 10 MG tablet Take 40 mg by mouth daily.   No facility-administered encounter medications on file as of 05/06/2019.    ALLERGIES:  No Known Allergies   PHYSICAL EXAM:  ECOG Performance status: 1  Vitals:   05/06/19 0918  BP: 131/63  Pulse: 89  Resp: 20  Temp: (!) 96.9 F (36.1 C)  SpO2: 95%   Filed Weights   05/06/19 0918  Weight: 253 lb 4.8 oz (114.9 kg)    Physical Exam Vitals reviewed.  Constitutional:      Appearance: Normal appearance.  Cardiovascular:     Rate and Rhythm: Normal rate and regular rhythm.     Heart sounds: Normal heart sounds.  Pulmonary:     Effort: Pulmonary effort is normal.     Comments: Crepitations in the left lower base heard. Abdominal:     General: There is no distension.     Palpations: Abdomen is soft. There is no mass.  Lymphadenopathy:     Cervical: No cervical adenopathy.  Skin:    General: Skin is warm.  Neurological:     General: No focal deficit present.     Mental Status: He is alert and oriented to person, place, and time.  Psychiatric:        Mood and Affect: Mood normal.        Behavior: Behavior normal.      LABORATORY DATA:  I have reviewed the labs as listed.  CBC    Component Value Date/Time   WBC 8.5 05/06/2019 0922   RBC 4.44 05/06/2019 0922   HGB 14.0 05/06/2019 0922   HCT 44.1 05/06/2019 0922  PLT 136 (L) 05/06/2019 0922   MCV 99.3 05/06/2019 0922   MCH 31.5 05/06/2019 0922   MCHC 31.7 05/06/2019 0922   RDW 14.2 05/06/2019 0922   LYMPHSABS 0.5 (L) 05/06/2019 0922   MONOABS 0.7 05/06/2019 0922   EOSABS 0.1 05/06/2019 0922   BASOSABS 0.0 05/06/2019 0922   CMP Latest Ref Rng & Units 05/06/2019 04/21/2019 03/23/2019  Glucose 70 - 99 mg/dL 436(H) 343(H) 346(H)  BUN 8 - 23 mg/dL 20 22 22   Creatinine 0.61 - 1.24 mg/dL 1.37(H) 1.19 1.32(H)  Sodium 135 - 145 mmol/L 133(L) 134(L) 137  Potassium 3.5 - 5.1 mmol/L 4.7 4.1 4.2  Chloride 98 - 111 mmol/L 95(L)  96(L) 98  CO2 22 - 32 mmol/L 26 26 26   Calcium 8.9 - 10.3 mg/dL 9.0 9.6 9.8  Total Protein 6.5 - 8.1 g/dL 6.4(L) 7.1 -  Total Bilirubin 0.3 - 1.2 mg/dL 0.7 0.6 -  Alkaline Phos 38 - 126 U/L 59 57 -  AST 15 - 41 U/L 29 23 -  ALT 0 - 44 U/L 49(H) 37 -       DIAGNOSTIC IMAGING:  I have reviewed scans and discussed with the patient.     ASSESSMENT & PLAN:   Small cell lung cancer ([place]) 1.  Recurrent small cell lung cancer: -Treated for limited stage small cell lung cancer with chemoradiation therapy on 06/21/2016.  He also received PCI. -CT scans on 04/17/2019 showed worsening right lung nodules.  Left lower lobe infectious cavitary lesion has decreased in size.  New right middle lobe lung nodule present. -CT of the head showed hypodensity in the left side of the pons, possible metastasis. -I have recommended MRI of the brain with Valium as premedication. -He already has a port placed.  I have reviewed his labs.  Creatinine is 1.37.  CBC was adequate.  LFTs are normal. -As he had a lot of difficulty and admissions with last chemotherapy, I will cut back on the dose of carboplatin to AUC 4.  We will also cut back on etoposide to 80 mg per metered squared.  We will give him extra hydration on day 3.  We will also give him hydration on Monday when he comes for Udenyca. -We have discussed the side effects of immunotherapy and chemotherapy regimen in detail.  I plan to increase the doses if he tolerates this cycle very well. -He is scheduled for MRI of the brain soon.  2.  Diabetes: -He is continue Metformin.  He is also continuing Lantus twice daily.  He is on Humalog sliding scale.  3.  Atrial fibrillation: -He is on Eliquis 5 mg twice daily.  No bleeding issues reported.      Orders placed this encounter:  No orders of the defined types were placed in this encounter.     Derek Jack, MD [place] [phone]

## 2019-05-06 NOTE — Progress Notes (Signed)
Patient has been assessed, vital signs and labs have been reviewed by Dr. Katragadda. ANC, Creatinine, LFTs, and Platelets are within treatment parameters per Dr. Katragadda. The patient is good to proceed with treatment at this time.  

## 2019-05-06 NOTE — Assessment & Plan Note (Addendum)
1.  Recurrent small cell lung cancer: -Treated for limited stage small cell lung cancer with chemoradiation therapy on 06/21/2016.  He also received PCI. -CT scans on 04/17/2019 showed worsening right lung nodules.  Left lower lobe infectious cavitary lesion has decreased in size.  New right middle lobe lung nodule present. -CT of the head showed hypodensity in the left side of the pons, possible metastasis. -I have recommended MRI of the brain with Valium as premedication. -He already has a port placed.  I have reviewed his labs.  Creatinine is 1.37.  CBC was adequate.  LFTs are normal. -As he had a lot of difficulty and admissions with last chemotherapy, I will cut back on the dose of carboplatin to AUC 4.  We will also cut back on etoposide to 80 mg per metered squared.  We will give him extra hydration on day 3.  We will also give him hydration on Monday when he comes for Udenyca. -We have discussed the side effects of immunotherapy and chemotherapy regimen in detail.  I plan to increase the doses if he tolerates this cycle very well. -He is scheduled for MRI of the brain soon.  2.  Diabetes: -He is continue Metformin.  He is also continuing Lantus twice daily.  He is on Humalog sliding scale.  3.  Atrial fibrillation: -He is on Eliquis 5 mg twice daily.  No bleeding issues reported.

## 2019-05-06 NOTE — Patient Instructions (Addendum)
[facility] Discharge Instructions for Patients Receiving Chemotherapy   Beginning January 23rd 2017 lab work for the [facility] will be done in the  Main lab at [facility] on 1st floor. If you have a lab appointment with the [facility] please come in thru the  Main Entrance and check in at the main information desk   Today you received the following chemotherapy agents Carboplatin,Tecentriq and VP-16. Follow-up as scheduled. Call clinic for any questions or concerns To help prevent nausea and vomiting after your treatment, we encourage you to take your nausea medication  If you develop nausea and vomiting, or diarrhea that is not controlled by your medication, call the clinic.  The clinic phone number is [phone]. Office hours are Monday-Friday 8:30am-5:00pm.  BELOW ARE SYMPTOMS THAT SHOULD BE REPORTED IMMEDIATELY:  *FEVER GREATER THAN 101.0 F  *CHILLS WITH OR WITHOUT FEVER  NAUSEA AND VOMITING THAT IS NOT CONTROLLED WITH YOUR NAUSEA MEDICATION  *UNUSUAL SHORTNESS OF BREATH  *UNUSUAL BRUISING OR BLEEDING  TENDERNESS IN MOUTH AND THROAT WITH OR WITHOUT PRESENCE OF ULCERS  *URINARY PROBLEMS  *BOWEL PROBLEMS  UNUSUAL RASH Items with * indicate a potential emergency and should be followed up as soon as possible. If you have an emergency after office hours please contact your primary care physician or go to the nearest emergency department.  Please call the clinic during office hours if you have any questions or concerns.   You may also contact the Patient Navigator at [phone] should you have any questions or need assistance in obtaining follow up care.      Resources For Cancer Patients and their Caregivers ? American Cancer Society: Can assist with transportation, wigs, general needs, runs Look Good Feel Better.        (682) 531-5373 ? Cancer Care: Provides financial assistance, online support groups, medication/co-pay assistance.   1-800-813-HOPE (808)666-5071) ? [facility] Assists [place] cancer patients and their families through emotional , educational and financial support.  (336) 361-1625 ? [place] DSS Where to apply for food stamps, Medicaid and utility assistance. (810)640-8317 ? [facility]: Transportation to medical appointments. 7606865386 ? Social Security Administration: May apply for disability if have a Stage IV cancer. ([phone] 559-104-1432 ? [employer], Disability and Transit Services: Assists with nutrition, care and transit needs. [phone]

## 2019-05-06 NOTE — Progress Notes (Signed)
[place] reviewed with and pt seen by Dr. Delton Coombes and pt approved for Carboplatin,VP-16 and Tecentriq infusions today per MD                                                                                             Chase Herrera tolerated chemo tx well without complaints or incident.Chemo teaching completed by Janan Ridge RN at bedside. VSS upon discharge. Pt discharged via wheelchair in satisfactory condition

## 2019-05-06 NOTE — Patient Instructions (Signed)
[facility] Chemotherapy Teaching   You are diagnosed with recurrent small cell lung cancer.  You will be treated on days 1-3 every 3 weeks with a combination of chemotherapy and immunotherapy drugs.  Those drugs are carboplatin, etoposide, and atezolizumab (Tecentriq).  You will receive all three drugs on day 1, then only etoposide on days 2-3.  You will do this for 4 cycles.  After the 4th cycle you will be treated every 3 weeks with Tecentriq only as maintenance therapy.  The intent of treatment is to control your cancer, prevent it from spreading further, and to alleviate any symptoms you may be having related to your disease.  You will see the doctor regularly throughout treatment.  We will obtain blood work from you prior to every treatment and monitor your results to make sure it is safe to give your treatment. The doctor monitors your response to treatment by the way you are feeling, your blood work, and by obtaining scans periodically. There will be wait times while you are here for treatment.  It will take about 30 minutes to 1 hour for your lab work to result.  Then there will be wait times while pharmacy mixes your medications.    Atezolizumab Gildardo Pounds)  About This Drug Huey Bienenstock is used to treat cancer. It is given by the vein (IV).  Possible Side Effects . Nausea . Tiredness and weakness . Decreased appetite (decreased hunger) . Cough . Trouble breathing  Note: Each of the side effects above was reported in 20% or greater of patients treated with atezolizumab. Your side effects may be different if you are taking atezolizumab in combination with another agent. Not all possible side effects are included above.  Warnings and Precautions . This drug works with your immune system and can cause inflammation (swelling) in any of your organs and tissues and can change how they work. This may put you at risk for developing serious medical problems, which can be  life-threatening.  . Inflammation of the lungs which can be life-threatening. You may have a dry cough or trouble breathing.  . Severe changes in your liver function which can cause liver failure and be life-threatening.  . Colitis which is swelling in the colon. The symptoms are diarrhea, stomach cramping, and sometimes blood in the bowel movements.  . Changes in your central nervous system can happen. The central nervous system is made up of your brain and spinal cord. You could feel extreme tiredness, agitation, confusion, hallucinations (see or hear things that are not there), trouble understanding or speaking, loss of control of your bowels or bladder, eyesight changes, numbness or lack of strength to your arms, legs, face, or body, and coma. If you start to have any of these symptoms let your doctor know right away.  . This drug may affect your hormone glands (thyroid, adrenals, pituitary and pancreas).  . Blood sugar levels may change, and you may develop diabetes. If you already have diabetes, changes may need to be made to your diabetes medication.  . Severe infections, including viral, bacterial and fungal, which can be life-threatening  . While you are getting this drug in your vein (IV), you may have a reaction to the drug. Sometimes you may be given medication to stop or lessen these side effects. Your nurse will check you closely for these signs: fever or shaking chills, flushing, facial swelling, feeling dizzy, headache, trouble breathing, rash, itching, chest tightness, or chest pain. These reactions may happen after  your infusion. If this happens, call 911 for emergency care.  Note: Some of the side effects above are very rare. If you have concerns and/or questions, please discuss them with your medical team.  Important Information . This drug may be present in the saliva, tears, sweat, urine, stool, vomit, semen, and vaginal secretions. Talk to your doctor and/or your nurse  about the necessary precautions to take during this time.   Treating Side Effects  . Manage tiredness by pacing your activities for the day.  . Be sure to include periods of rest between energy-draining activities.  . To decrease the risk of infection, wash your hands regularly.  . Avoid close contact with people who have a cold, the flu, or other infections.  . Take your temperature as your doctor or nurse tells you, and whenever you feel like you may have a fever.  . Drink plenty of fluids (a minimum of eight glasses per day is recommended).  . Ask your doctor or nurse about medicine that is available to help stop or lessen loose bowel movements.  . To help with nausea, eat small, frequent meals instead of three large meals a day. Choose foods and drinks that are at room temperature. Ask your nurse or doctor about other helpful tips and medicine that is available to help stop or lessen these symptoms. . To help with decreased appetite, eat small, frequent meals. Eat foods high in calories and protein, such as meat, poultry, fish, dry beans, tofu, eggs, nuts, milk, yogurt, cheese, ice cream, pudding, and nutritional supplements.  . Consider using sauces and spices to increase taste. Daily exercise, with your doctor's approval, may increase your appetite.  . If you have diabetes, keep good control of your blood sugar level. Tell your nurse or your doctor if your glucose levels are higher or lower than normal.  . Keeping your pain under control is important to your well-being. Please tell your doctor or nurse if you are experiencing pain.  . If you get a rash do not put anything on it unless your doctor or nurse says you may. Keep the area around the rash clean and dry. Ask your doctor for medicine if your rash bothers you.  . If you have numbness and tingling in your hands and feet, be careful when cooking, walking, and handling sharp objects and hot liquids.  . Infusion reactions may  happen after your infusion. If this happens, call 911 for emergency care.  Food and Drug Interactions . There are no known interactions of atezolizumab with food.  . This drug may interact with other medicines. Tell your doctor and pharmacist about all the prescription and over-the-counter medicines and dietary supplements (vitamins, minerals, herbs and others) that you are taking at this time. Also, check with your doctor or pharmacist before starting any new prescription or over-the-counter medicines, or dietary supplements to make sure that there are no interactions.  When to Call the Doctor  Call your doctor or nurse if you have any of these symptoms and/or any new or unusual symptoms:  . Fever of 100.4 F (38 C) or higher  . Chills  . Tiredness that interferes with your daily activities  . Feeling dizzy or lightheaded  . Pain in your chest  . Dry cough  . Coughing up yellow, green, or bloody mucus  . Wheezing or trouble breathing  . Feeling that your heart is beating in a fast or not normal way (palpitations)  . Confusion and/or agitation  .  Hallucinations  . Trouble understanding or speaking  . Numbness or lack of strength to your arms, legs, face, or body  . Blurred vision or other changes in eyesight  . Diarrhea, 4 times in one day or diarrhea with lack of strength or a feeling of being dizzy  . Pain in your abdomen that does not go away  . Blood in your stool  . Nausea that stops you from eating or drinking and/or is not relieved by prescribed medicines  . Throwing up  . Lasting loss of appetite or rapid weight loss of five pounds in a week  . Abnormal blood sugar  . Unusual thirst, passing urine often, headache, sweating, shakiness, irritability  . Pain that does not go away, or is not relieved by prescribed medicines  . Numbness, tingling, or pain your hands and feet  . Extreme weakness that interferes with normal activities  . A new rash or a  rash that is not relieved by prescribed medicines  . Signs of infusion reaction: fever or shaking chills, flushing, facial swelling, feeling dizzy, headache, trouble breathing, rash, itching, chest tightness, or chest pain. If this happens, call 911 for emergency care.  . Signs of possible liver problems: dark urine, pale bowel movements, bad stomach pain, feeling very tired and weak, unusual itching, or yellowing of the eyes or skin  . If you think you may be pregnant  Reproduction Warnings  . Pregnancy warning: This drug can have harmful effects on the unborn baby. Women of childbearing potential should use effective methods of birth control during your cancer treatment and for at least 5 months after treatment. Let your doctor know right away if you think you may be pregnant.  . Breastfeeding warning: It is not known if this drug passes into breast milk. For this reason, Women should not breastfeed during treatment and for at least 5 months after treatment because this drug could enter the breast milk and cause harm to a breastfeeding baby.  . Fertility warning: In women, this drug may affect your ability to have children in the future. Talk with your doctor or nurse if you plan to have children. Ask for information on egg banking.  Carboplatin (Paraplatin, CBDCA)  About This Drug  Carboplatin is used to treat cancer. It is given in the vein (IV).  This drug will take 30 minutes to infuse.   Possible Side Effects  . Bone marrow suppression. This is a decrease in the number of white blood cells, red blood cells, and platelets. This may raise your risk of infection, make you tired and weak (fatigue), and raise your risk of bleeding.  . Nausea and vomiting (throwing up)  . Weakness  . Changes in your liver function  . Changes in your kidney function  . Electrolyte changes  . Pain  Note: Each of the side effects above was reported in 20% or greater of patients treated with  carboplatin. Not all possible side effects are included above.  Warnings and Precautions  . Severe bone marrow suppression  . Allergic reactions, including anaphylaxis are rare but may happen in some patients. Signs of allergic reaction to this drug may be swelling of the face, feeling like your tongue or throat are swelling, trouble breathing, rash, itching, fever, chills, feeling dizzy, and/or feeling that your heart is beating in a fast or not normal way. If this happens, do not take another dose of this drug. You should get urgent medical treatment.  . Severe nausea  and vomiting  . Effects on the nerves are called peripheral neuropathy. This risk is increased if you are over the age of 38 or if you have received other medicine with risk of peripheral neuropathy. You may feel numbness, tingling, or pain in your hands and feet. It may be hard for you to button your clothes, open jars, or walk as usual. The effect on the nerves may get worse with more doses of the drug. These effects get better in some people after the drug is stopped but it does not get better in all people.  Marland Kitchen Blurred vision, loss of vision or other changes in eyesight  . Decreased hearing   - Skin and tissue irritation including redness, pain, warmth, or swelling at the IV site if the drug leaks out of the vein and into nearby tissue.  . Severe changes in your kidney function, which can cause kidney failure  . Severe changes in your liver function, which can cause liver failure  Note: Some of the side effects above are very rare. If you have concerns and/or questions, please discuss them with your medical team.  Important Information  . This drug may be present in the saliva, tears, sweat, urine, stool, vomit, semen, and vaginal secretions. Talk to your doctor and/or your nurse about the necessary precautions to take during this time.  Treating Side Effects  . Manage tiredness by pacing your activities for the  day.  . Be sure to include periods of rest between energy-draining activities.  . To decrease the risk of infection, wash your hands regularly.  . Avoid close contact with people who have a cold, the flu, or other infections.  . Take your temperature as your doctor or nurse tells you, and whenever you feel like you may have a fever.  . To help decrease the risk of bleeding, use a soft toothbrush. Check with your nurse before using dental floss.  . Be very careful when using knives or tools.  . Use an electric shaver instead of a razor.  . Drink plenty of fluids (a minimum of eight glasses per day is recommended).  . If you throw up or have loose bowel movements, you should drink more fluids so that you do not become dehydrated (lack of water in the body from losing too much fluid).  . To help with nausea and vomiting, eat small, frequent meals instead of three large meals a day. Choose foods and drinks that are at room temperature. Ask your nurse or doctor about other helpful tips and medicine that is available to help stop or lessen these symptoms.  . If you have numbness and tingling in your hands and feet, be careful when cooking, walking, and handling sharp objects and hot liquids.  Marland Kitchen Keeping your pain under control is important to your well-being. Please tell your doctor or nurse if you are experiencing pain.  Food and Drug Interactions . There are no known interactions of carboplatin with food.  . This drug may interact with other medicines. Tell your doctor and pharmacist about all the prescription and over-the-counter medicines and dietary supplements (vitamins, minerals, herbs and others) that you are taking at this time. Also, check with your doctor or pharmacist before starting any new prescription or over-the-counter medicines, or dietary supplements to make sure that there are no interactions.  When to Call the Doctor  Call your doctor or nurse if you have any of these  symptoms and/or any new or unusual symptoms:  .  Fever of 100.4 F (38 C) or higher  . Chills  . Tiredness that interferes with your daily activities  . Feeling dizzy or lightheaded  . Easy bleeding or bruising  . Nausea that stops you from eating or drinking and/or is not relieved by prescribed medicines  . Throwing up  . Blurred vision or other changes in eyesight  . Decrease in hearing or ringing in the ear  . Signs of allergic reaction: swelling of the face, feeling like your tongue or throat are swelling, trouble breathing, rash, itching, fever, chills, feeling dizzy, and/or feeling that your heart is beating in a fast or not normal way. If this happens, call 911 for emergency care.  . Signs of possible liver problems: dark urine, pale bowel movements, bad stomach pain, feeling very tired and weak, unusual itching, or yellowing of the eyes or skin  . Decreased urine, or very dark urine  . Numbness, tingling, or pain in your hands and feet  . Pain that does not go away or is not relieved by prescribed medicine  . While you are getting this drug, please tell your nurse right away if you have any pain, redness, or swelling at the site of the IV infusion, or if you have any new onset of symptoms, or if you just feel "different" from before when the infusion was started.   Reproduction Warnings  . Pregnancy warning: This drug may have harmful effects on the unborn baby. Women of child bearing potential should use effective methods of birth control during your cancer treatment. Let your doctor know right away if you think you may be pregnant.  . Breastfeeding warning: It is not known if this drug passes into breast milk. For this reason, women should not breastfeed during treatment because this drug could enter the breast milk and cause harm to a breastfeeding baby.  . Fertility warning: Human fertility studies have not been done with this drug. Talk with your doctor or nurse if  you plan to have children. Ask for information on sperm or egg banking.  Etoposide  About This Drug  Etoposide is used to treat cancer. It is given in the vein (IV).  This drug will take 1 hour to infuse.    Possible Side Effects  . Bone marrow suppression. This is a decrease in the number of white blood cells, red blood cells, and platelets. This may raise your risk of infection, make you tired and weak (fatigue), and raise your risk of bleeding.  . Nausea and vomiting (throwing up)  . Hair loss. Hair loss is often temporary, although with certain medicine, hair loss can sometimes be permanent. Hair loss may happen suddenly or gradually. If you lose hair, you may lose it from your head, face, armpits, pubic area, chest, and/or legs. You may also notice your hair getting thin.  Note: Each of the side effects above was reported in 20% or greater of patients treated with etoposide. Not all possible side effects are included above.  Warnings and Precautions  . Severe bone marrow suppression, which may be life-threatening  . This drug may raise your risk of getting a second cancer, such as leukemia  . Low blood pressure with rapid infusion of the medication  . Allergic reactions, including anaphylaxis are rare but may happen in some patients. Signs of allergic reaction to this drug may be swelling of the face, feeling like your tongue or throat are swelling, trouble breathing, rash, itching, fever, chills, feeling dizzy,  and/or feeling that your heart is beating in a fast or not normal way. If this happens, do not take another dose of this drug. You should get urgent medical treatment.  . These side effects may be more severe if you are receiving high doses of this medication included in pre-transplant chemotherapy.  Treating Side Effects  . Manage tiredness by pacing your activities for the day.  . Be sure to include periods of rest between energy-draining activities.  . To decrease  the risk of infection, wash your hands regularly.  . Avoid close contact with people who have a cold, the flu, or other infections.  . Take your temperature as your doctor or nurse tells you, and whenever you feel like you may have a fever.  . To help decrease bleeding, use a soft toothbrush. Check with your nurse before using dental floss.  . Be very careful when using knives or tools.  . Use an electric shaver instead of a razor.  . Drink plenty of fluids (a minimum of eight glasses per day is recommended).  . If you throw up or have loose bowel movements, you should drink more fluids so that you do not become dehydrated (lack of water in the body from losing too much fluid).  . To help with nausea and vomiting, eat small, frequent meals instead of three large meals a day. Choose foods and drinks that are at room temperature. Ask your nurse or doctor about other helpful tips and medicine that is available to help or stop lessen these symptoms.  . To help with hair loss, wash with a mild shampoo and avoid washing your hair every day.  . Avoid rubbing your scalp, pat your hair or scalp dry.  . Avoid coloring your hair.  . Limit your use of hair spray, electric curlers, blow dryers, and curling irons.  . If you are interested in getting a wig, talk to your nurse. You can also call the [facility] at [phone] to find out information about the "Look Good, Feel Better" program close to where you live. It is a free program where women getting chemotherapy can learn about wigs, turbans and scarves as well as makeup techniques and skin and nail care.  Food and Drug Interactions  . There are no known interactions of etoposide with food.  . This drug may interact with other medicines. Tell your doctor and pharmacist about all the medicines and dietary supplements (vitamins, minerals, herbs and others) that you are taking at this time. The safety and use of dietary supplements  and alternative diets are often not known. Using these might affect your cancer or interfere with your treatment. Until more is known, you should not use dietary supplements or alternative diets without your cancer doctor's help.  . There are known interactions of etoposide with blood thinning medicine such as warfarin. Ask your doctor what precautions you should take.  When to Call the Doctor  Call your doctor or nurse if you have any of these symptoms and/or any new or unusual symptoms:  . Fever of 100.4 F (38 C) or higher  . Chills  . Tiredness that interferes with your daily activities  . Feeling dizzy or lightheaded  . Feeling that your heart is beating in a fast or not normal way (palpitations)  . Easy bleeding or bruising  . Nausea that stops you from eating or drinking and/or is not relieved by prescribed medicines  . Throwing up  . Signs  of allergic reaction: swelling of the face, feeling like your tongue or throat are swelling, trouble breathing, rash, itching, fever, chills, feeling dizzy, and/or feeling that your heart is beating in a fast or not normal way  . If you think you may be pregnant or may have impregnated your partner  Reproduction Warnings  . Pregnancy warning: This drug can have harmful effects on the unborn baby. Women of childbearing potential should use effective methods of birth control during your cancer treatment and for at least 6 months after treatment. Men with male partners of childbearing potential should use effective methods of birth control during your cancer treatment and for at least 4 months after your cancer treatment. Let your doctor know right away if you think you may be pregnant or may have impregnated your partner.  . Breastfeeding warning: It is not known if this drug passes into breast milk. For this reason, women should talk to their doctor about the risks and benefits of breastfeeding during treatment with this drug because this  drug may enter the breast milk and cause harm to a breastfeeding baby.  . Fertility warning: In men and women both, this drug may affect your ability to have children in the future. Talk with your doctor or nurse if you plan to have children. Ask for information on sperm or egg banking.   SELF CARE ACTIVITIES WHILE RECEIVING CHEMOTHERAPY:  Hydration Increase your fluid intake 48 hours prior to treatment and drink at least 8 to 12 cups (64 ounces) of water/decaffeinated beverages per day after treatment. You can still have your cup of coffee or soda but these beverages do not count as part of your 8 to 12 cups that you need to drink daily. No alcohol intake.  Medications Continue taking your normal prescription medication as prescribed.  If you start any new herbal or new supplements please let us know first to make sure it is safe.  Mouth Care Have teeth cleaned professionally before starting treatment. Keep dentures and partial plates clean. Use soft toothbrush and do not use mouthwashes that contain alcohol. Biotene is a good mouthwash that is available at most pharmacies or may be ordered by calling [phone]. Use warm salt water gargles (1 teaspoon salt per 1 quart warm water) before and after meals and at bedtime. If you need dental work, please let the doctor know before you go for your appointment so that we can coordinate the best possible time for you in regards to your chemo regimen. You need to also let your dentist know that you are actively taking chemo. We may need to do labs prior to your dental appointment.  Skin Care Always use sunscreen that has not expired and with SPF (Sun Protection Factor) of 50 or higher. Wear hats to protect your head from the sun. Remember to use sunscreen on your hands, ears, face, & feet.  Use good moisturizing lotions such as udder cream, eucerin, or even Vaseline. Some chemotherapies can cause dry skin, color changes in your skin and nails.     . Avoid long, hot showers or baths. . Use gentle, fragrance-free soaps and laundry detergent. . Use moisturizers, preferably creams or ointments rather than lotions because the thicker consistency is better at preventing skin dehydration. Apply the cream or ointment within 15 minutes of showering. Reapply moisturizer at night, and moisturize your hands every time after you wash them.  Hair Loss (if your doctor says your hair will fall out)  . If  your doctor says that your hair is likely to fall out, decide before you begin chemo whether you want to wear a wig. You may want to shop before treatment to match your hair color. . Hats, turbans, and scarves can also camouflage hair loss, although some people prefer to leave their heads uncovered. If you go bare-headed outdoors, be sure to use sunscreen on your scalp. . Cut your hair short. It eases the inconvenience of shedding lots of hair, but it also can reduce the emotional impact of watching your hair fall out. . Don't perm or color your hair during chemotherapy. Those chemical treatments are already damaging to hair and can enhance hair loss. Once your chemo treatments are done and your hair has grown back, it's OK to resume dyeing or perming hair.  With chemotherapy, hair loss is almost always temporary. But when it grows back, it may be a different color or texture. In older adults who still had hair color before chemotherapy, the new growth may be completely gray.  Often, new hair is very fine and soft.  Infection Prevention Please wash your hands for at least 30 seconds using warm soapy water. Handwashing is the #1 way to prevent the spread of germs. Stay away from sick people or people who are getting over a cold. If you develop respiratory systems such as green/yellow mucus production or productive cough or persistent cough let us know and we will see if you need an antibiotic. It is a good idea to keep a pair of gloves on when going into  grocery stores/[employer] to decrease your risk of coming into contact with germs on the carts, etc. Carry alcohol hand gel with you at all times and use it frequently if out in public. If your temperature reaches 100.5 or higher please call the clinic and let us know.  If it is after hours or on the weekend please go to the ER if your temperature is over 100.5.  Please have your own personal thermometer at home to use.    Sex and bodily fluids If you are going to have sex, a condom must be used to protect the person that isn't taking chemotherapy. Chemo can decrease your libido (sex drive). For a few days after chemotherapy, chemotherapy can be excreted through your bodily fluids.  When using the toilet please close the lid and flush the toilet twice.  Do this for a few day after you have had chemotherapy.   Effects of chemotherapy on your sex life Some changes are simple and won't last long. They won't affect your sex life permanently.  Sometimes you may feel: . too tired . not strong enough to be very active . sick or sore  . not in the mood . anxious or low Your anxiety might not seem related to sex. For example, you may be worried about the cancer and how your treatment is going. Or you may be worried about money, or about how you family are coping with your illness.  These things can cause stress, which can affect your interest in sex. It's important to talk to your partner about how you feel.  Remember - the changes to your sex life don't usually last long. There's usually no medical reason to stop having sex during chemo. The drugs won't have any long term physical effects on your performance or enjoyment of sex. Cancer can't be passed on to your partner during sex  Contraception It's important to use reliable contraception during  treatment. Avoid getting pregnant while you or your partner are having chemotherapy. This is because the drugs may harm the baby. Sometimes chemotherapy drugs can  leave a man or woman infertile.  This means you would not be able to have children in the future. You might want to talk to someone about permanent infertility. It can be very difficult to learn that you may no longer be able to have children. Some people find counselling helpful. There might be ways to preserve your fertility, although this is easier for men than for women. You may want to speak to a fertility expert. You can talk about sperm banking or harvesting your eggs. You can also ask about other fertility options, such as donor eggs. If you have or have had breast cancer, your doctor might advise you not to take the contraceptive pill. This is because the hormones in it might affect the cancer. It is not known for sure whether or not chemotherapy drugs can be passed on through semen or secretions from the vagina. Because of this some doctors advise people to use a barrier method if you have sex during treatment. This applies to vaginal, anal or oral sex. Generally, doctors advise a barrier method only for the time you are actually having the treatment and for about a week after your treatment. Advice like this can be worrying, but this does not mean that you have to avoid being intimate with your partner. You can still have close contact with your partner and continue to enjoy sex.  Animals If you have cats or birds we just ask that you not change the litter or change the cage.  Please have someone else do this for you while you are on chemotherapy.   Food Safety During and After Cancer Treatment Food safety is important for people both during and after cancer treatment. Cancer and cancer treatments, such as chemotherapy, radiation therapy, and stem cell/bone marrow transplantation, often weaken the immune system. This makes it harder for your body to protect itself from foodborne illness, also called food poisoning. Foodborne illness is caused by eating food that contains harmful bacteria, parasites,  or viruses.  Foods to avoid Some foods have a higher risk of becoming tainted with bacteria. These include: [place] Kitchen Unwashed fresh fruit and vegetables, especially leafy vegetables that can hide dirt and other contaminants . Raw sprouts, such as alfalfa sprouts . Raw or undercooked beef, especially ground beef, or other raw or undercooked meat and poultry . Fatty, fried, or spicy foods immediately before or after treatment.  These can sit heavy on your stomach and make you feel nauseous. . Raw or undercooked shellfish, such as oysters. . Sushi and sashimi, which often contain raw fish.  . Unpasteurized beverages, such as unpasteurized fruit juices, raw milk, raw yogurt, or cider . Undercooked eggs, such as soft boiled, over easy, and poached; raw, unpasteurized eggs; or foods made with raw egg, such as homemade raw cookie dough and homemade mayonnaise  Simple steps for food safety  Shop smart. . Do not buy food stored or displayed in an unclean area. . Do not buy bruised or damaged fruits or vegetables. . Do not buy cans that have cracks, dents, or bulges. . Pick up foods that can spoil at the end of your shopping trip and store them in a cooler on the way home.  Prepare and clean up foods carefully. . Rinse all fresh fruits and vegetables under running water, and dry them with a clean  towel or paper towel. . Clean the top of cans before opening them. . After preparing food, wash your hands for 20 seconds with hot water and soap. Pay special attention to areas between fingers and under nails. . Clean your utensils and dishes with hot water and soap. [place] Kitchen Disinfect your kitchen and cutting boards using 1 teaspoon of liquid, unscented bleach mixed into 1 quart of water.    Dispose of old food. . Eat canned and packaged food before its expiration date (the "use by" or "best before" date). . Consume refrigerated leftovers within 3 to 4 days. After that time, throw out the food. Even if the food does  not smell or look spoiled, it still may be unsafe. Some bacteria, such as Listeria, can grow even on foods stored in the refrigerator if they are kept for too long.  Take precautions when eating out. . At restaurants, avoid buffets and salad bars where food sits out for a long time and comes in contact with many people. Food can become contaminated when someone with a virus, often a norovirus, or another "bug" handles it. . Put any leftover food in a "to-go" container yourself, rather than having the server do it. And, refrigerate leftovers as soon as you get home. . Choose restaurants that are clean and that are willing to prepare your food as you order it cooked.   AT HOME MEDICATIONS:                                                                                                                                                                Compazine/Prochlorperazine 10mg  tablet. Take 1 tablet every 6 hours as needed for nausea/vomiting. (This can make you sleepy)   EMLA cream. Apply a quarter size amount to port site 1 hour prior to chemo. Do not rub in. Cover with plastic wrap.    Diarrhea Sheet   If you are having loose stools/diarrhea, please purchase Imodium and begin taking as outlined:  At the first sign of poorly formed or loose stools you should begin taking Imodium (loperamide) 2 mg capsules.  Take two tablets (4mg ) followed by one tablet (2mg ) every 2 hours - DO NOT EXCEED 8 tablets in 24 hours.  If it is bedtime and you are having loose stools, take 2 tablets at bedtime, then 2 tablets every 4 hours until morning.   Always call the [facility] if you are having loose stools/diarrhea that you can't get under control.  Loose stools/diarrhea leads to dehydration (loss of water) in your body.  We have other options of trying to get the loose stools/diarrhea to stop but you must let us know!   Constipation Sheet  Colace - 100 mg capsules - take 2 capsules daily.  If this  doesn't help then you can increase to 2 capsules twice daily.  Please call if the above does not work for you. Do not go more than 2 days without a bowel movement.  It is very important that you do not become constipated.  It will make you feel sick to your stomach (nausea) and can cause abdominal pain and vomiting.  Nausea Sheet   Compazine/Prochlorperazine 10mg  tablet. Take 1 tablet every 6 hours as needed for nausea/vomiting (This can make you drowsy).  If you are having persistent nausea (nausea that does not stop) please call the [facility] and let us know the amount of nausea that you are experiencing.  If you begin to vomit, you need to call the [facility] and if it is the weekend and you have vomited more than one time and can't get it to stop-go to the Emergency Room.  Persistent nausea/vomiting can lead to dehydration (loss of fluid in your body) and will make you feel very weak and unwell. Ice chips, sips of clear liquids, foods that are at room temperature, crackers, and toast tend to be better tolerated.   SYMPTOMS TO REPORT AS SOON AS POSSIBLE AFTER TREATMENT:  FEVER GREATER THAN 100.5 F  CHILLS WITH OR WITHOUT FEVER  NAUSEA AND VOMITING THAT IS NOT CONTROLLED WITH YOUR NAUSEA MEDICATION  UNUSUAL SHORTNESS OF BREATH  UNUSUAL BRUISING OR BLEEDING  TENDERNESS IN MOUTH AND THROAT WITH OR WITHOUT   PRESENCE OF ULCERS  URINARY PROBLEMS  BOWEL PROBLEMS  UNUSUAL RASH      Wear comfortable clothing and clothing appropriate for easy access to any Portacath or PICC line. Let us know if there is anything that we can do to make your therapy better!    What to do if you need assistance after hours or on the weekends: CALL [phone].  HOLD on the line, do not hang up.  You will hear multiple messages but at the end you will be connected with a nurse triage line.  They will contact the doctor if necessary.  Most of the time they will be able to assist you.  Do not call  the hospital operator.      I have been informed and understand all of the instructions given to me and have received a copy. I have been instructed to call the clinic [phone] or my family physician as soon as possible for continued medical care, if indicated. I do not have any more questions at this time but understand that I may call the [facility] or the Patient Navigator at [phone] during office hours should I have questions or need assistance in obtaining follow-up care.

## 2019-05-07 ENCOUNTER — Inpatient Hospital Stay (HOSPITAL_COMMUNITY): Payer: Medicare Other

## 2019-05-07 VITALS — BP 130/60 | HR 81 | Temp 97.5°F | Resp 20

## 2019-05-07 DIAGNOSIS — C3491 Malignant neoplasm of unspecified part of right bronchus or lung: Secondary | ICD-10-CM

## 2019-05-07 DIAGNOSIS — Z5112 Encounter for antineoplastic immunotherapy: Secondary | ICD-10-CM | POA: Diagnosis not present

## 2019-05-07 MED ORDER — SODIUM CHLORIDE 0.9 % IV SOLN
80.0000 mg/m2 | Freq: Once | INTRAVENOUS | Status: AC
  Administered 2019-05-07: 190 mg via INTRAVENOUS
  Filled 2019-05-07: qty 9.5

## 2019-05-07 MED ORDER — SODIUM CHLORIDE 0.9% FLUSH
10.0000 mL | INTRAVENOUS | Status: DC | PRN
  Administered 2019-05-07: 10 mL

## 2019-05-07 MED ORDER — SODIUM CHLORIDE 0.9 % IV SOLN: Freq: Once | INTRAVENOUS | Status: AC

## 2019-05-07 MED ORDER — SODIUM CHLORIDE 0.9 % IV SOLN
10.0000 mg | Freq: Once | INTRAVENOUS | Status: AC
  Administered 2019-05-07: 10 mg via INTRAVENOUS
  Filled 2019-05-07: qty 10

## 2019-05-07 MED ORDER — HEPARIN SOD (PORK) LOCK FLUSH 100 UNIT/ML IV SOLN
500.0000 [IU] | Freq: Once | INTRAVENOUS | Status: AC | PRN
  Administered 2019-05-07: 500 [IU]

## 2019-05-07 NOTE — Progress Notes (Signed)
Treatment given per orders. Patient tolerated it well without problems. Vitals stable and discharged home from clinic via wheelchair Follow up as scheduled.  

## 2019-05-07 NOTE — Patient Instructions (Signed)
[facility] Discharge Instructions for Patients Receiving Chemotherapy  Today you received the following chemotherapy agents   To help prevent nausea and vomiting after your treatment, we encourage you to take your nausea medication   If you develop nausea and vomiting that is not controlled by your nausea medication, call the clinic.   BELOW ARE SYMPTOMS THAT SHOULD BE REPORTED IMMEDIATELY:  *FEVER GREATER THAN 100.5 F  *CHILLS WITH OR WITHOUT FEVER  NAUSEA AND VOMITING THAT IS NOT CONTROLLED WITH YOUR NAUSEA MEDICATION  *UNUSUAL SHORTNESS OF BREATH  *UNUSUAL BRUISING OR BLEEDING  TENDERNESS IN MOUTH AND THROAT WITH OR WITHOUT PRESENCE OF ULCERS  *URINARY PROBLEMS  *BOWEL PROBLEMS  UNUSUAL RASH Items with * indicate a potential emergency and should be followed up as soon as possible.  Feel free to call the clinic should you have any questions or concerns. The clinic phone number is [phone].  Please show the CHEMO ALERT CARD at check-in to the Emergency Department and triage nurse.   

## 2019-05-07 NOTE — Progress Notes (Signed)
Etoposide dose clarified to be: 80 mg/m2 for cycle #1 days 1,2,and 3.  Plan updated to reflect above.  V.O. Dr Alphonzo Severance Ronnald Ramp, Pharm D

## 2019-05-08 ENCOUNTER — Encounter (HOSPITAL_COMMUNITY): Payer: Self-pay

## 2019-05-08 ENCOUNTER — Inpatient Hospital Stay (HOSPITAL_COMMUNITY): Payer: Medicare Other

## 2019-05-08 ENCOUNTER — Other Ambulatory Visit: Payer: Self-pay

## 2019-05-08 VITALS — BP 119/59 | HR 71 | Temp 97.3°F | Resp 18

## 2019-05-08 DIAGNOSIS — C3491 Malignant neoplasm of unspecified part of right bronchus or lung: Secondary | ICD-10-CM

## 2019-05-08 DIAGNOSIS — Z5112 Encounter for antineoplastic immunotherapy: Secondary | ICD-10-CM | POA: Diagnosis not present

## 2019-05-08 MED ORDER — SODIUM CHLORIDE 0.9 % IV SOLN
80.0000 mg/m2 | Freq: Once | INTRAVENOUS | Status: AC
  Administered 2019-05-08: 190 mg via INTRAVENOUS
  Filled 2019-05-08: qty 9.5

## 2019-05-08 MED ORDER — SODIUM CHLORIDE 0.9% FLUSH
10.0000 mL | INTRAVENOUS | Status: DC | PRN
  Administered 2019-05-08: 10 mL

## 2019-05-08 MED ORDER — SODIUM CHLORIDE 0.9 % IV SOLN: Freq: Once | INTRAVENOUS | Status: AC

## 2019-05-08 MED ORDER — SODIUM CHLORIDE 0.9 % IV SOLN: INTRAVENOUS | Status: AC

## 2019-05-08 MED ORDER — HEPARIN SOD (PORK) LOCK FLUSH 100 UNIT/ML IV SOLN
500.0000 [IU] | Freq: Once | INTRAVENOUS | Status: AC | PRN
  Administered 2019-05-08: 500 [IU]

## 2019-05-08 MED ORDER — SODIUM CHLORIDE 0.9 % IV SOLN
10.0000 mg | Freq: Once | INTRAVENOUS | Status: AC
  Administered 2019-05-08: 10 mg via INTRAVENOUS
  Filled 2019-05-08: qty 10

## 2019-05-08 NOTE — Patient Instructions (Addendum)
[facility] Discharge Instructions for Patients Receiving Chemotherapy   Beginning January 23rd 2017 lab work for the [facility] will be done in the  Main lab at [facility] on 1st floor. If you have a lab appointment with the [facility] please come in thru the  Main Entrance and check in at the main information desk   Today you received the following chemotherapy agents VP-16 with hydration . Follow-up as scheduled. Call clinic for any questions or concerns  To help prevent nausea and vomiting after your treatment, we encourage you to take your nausea medication   If you develop nausea and vomiting, or diarrhea that is not controlled by your medication, call the clinic.  The clinic phone number is [phone]. Office hours are Monday-Friday 8:30am-5:00pm.  BELOW ARE SYMPTOMS THAT SHOULD BE REPORTED IMMEDIATELY:  *FEVER GREATER THAN 101.0 F  *CHILLS WITH OR WITHOUT FEVER  NAUSEA AND VOMITING THAT IS NOT CONTROLLED WITH YOUR NAUSEA MEDICATION  *UNUSUAL SHORTNESS OF BREATH  *UNUSUAL BRUISING OR BLEEDING  TENDERNESS IN MOUTH AND THROAT WITH OR WITHOUT PRESENCE OF ULCERS  *URINARY PROBLEMS  *BOWEL PROBLEMS  UNUSUAL RASH Items with * indicate a potential emergency and should be followed up as soon as possible. If you have an emergency after office hours please contact your primary care physician or go to the nearest emergency department.  Please call the clinic during office hours if you have any questions or concerns.   You may also contact the Patient Navigator at [phone] should you have any questions or need assistance in obtaining follow up care.      Resources For Cancer Patients and their Caregivers ? American Cancer Society: Can assist with transportation, wigs, general needs, runs Look Good Feel Better.        9735624927 ? Cancer Care: Provides financial assistance, online support groups, medication/co-pay assistance.   1-800-813-HOPE (206)819-3993) ? [facility] Assists [place] cancer patients and their families through emotional , educational and financial support.  586-321-2651 ? [place] DSS Where to apply for food stamps, Medicaid and utility assistance. 463-165-5663 ? [facility]: Transportation to medical appointments. 9170134864 ? Social Security Administration: May apply for disability if have a Stage IV cancer. 6360585456 435-782-5716 ? [employer], Disability and Transit Services: Assists with nutrition, care and transit needs. ([phone]

## 2019-05-08 NOTE — Progress Notes (Signed)
EVERRETT LACASSE tolerated VP-16 infusion and IV hydration well without complaints or incident. VSS upon discharge. Pt discharged via wheelchair in satisfactory condition

## 2019-05-11 ENCOUNTER — Inpatient Hospital Stay (HOSPITAL_COMMUNITY): Payer: Medicare Other

## 2019-05-11 ENCOUNTER — Other Ambulatory Visit: Payer: Self-pay

## 2019-05-11 ENCOUNTER — Ambulatory Visit (HOSPITAL_COMMUNITY): Payer: Medicare Other

## 2019-05-11 VITALS — BP 145/63 | HR 77 | Temp 97.7°F | Resp 18

## 2019-05-11 DIAGNOSIS — C3491 Malignant neoplasm of unspecified part of right bronchus or lung: Secondary | ICD-10-CM

## 2019-05-11 DIAGNOSIS — Z5112 Encounter for antineoplastic immunotherapy: Secondary | ICD-10-CM | POA: Diagnosis not present

## 2019-05-11 LAB — COMPREHENSIVE METABOLIC PANEL
ALT: 53 U/L — ABNORMAL HIGH (ref 0–44)
AST: 31 U/L (ref 15–41)
Albumin: 3.5 g/dL (ref 3.5–5.0)
Alkaline Phosphatase: 52 U/L (ref 38–126)
Anion gap: 11 (ref 5–15)
BUN: 33 mg/dL — ABNORMAL HIGH (ref 8–23)
CO2: 25 mmol/L (ref 22–32)
Calcium: 8.6 mg/dL — ABNORMAL LOW (ref 8.9–10.3)
Chloride: 94 mmol/L — ABNORMAL LOW (ref 98–111)
Creatinine, Ser: 1.25 mg/dL — ABNORMAL HIGH (ref 0.61–1.24)
GFR calc Af Amer: >60 mL/min (ref >60)
GFR calc non Af Amer: 60 mL/min — ABNORMAL LOW (ref >60)
Glucose, Bld: 358 mg/dL — ABNORMAL HIGH (ref 70–99)
Potassium: 4.3 mmol/L (ref 3.5–5.1)
Sodium: 130 mmol/L — ABNORMAL LOW (ref 135–145)
Total Bilirubin: 0.7 mg/dL (ref 0.3–1.2)
Total Protein: 6.4 g/dL — ABNORMAL LOW (ref 6.5–8.1)

## 2019-05-11 LAB — CBC WITH DIFFERENTIAL/PLATELET
Abs Immature Granulocytes: 0.06 10*3/uL (ref 0.00–0.07)
Basophils Absolute: 0 10*3/uL (ref 0.0–0.1)
Basophils Relative: 0 %
Eosinophils Absolute: 0 10*3/uL (ref 0.0–0.5)
Eosinophils Relative: 0 %
HCT: 42.2 % (ref 39.0–52.0)
Hemoglobin: 13.7 g/dL (ref 13.0–17.0)
Immature Granulocytes: 1 %
Lymphocytes Relative: 6 %
Lymphs Abs: 0.4 10*3/uL — ABNORMAL LOW (ref 0.7–4.0)
MCH: 31.5 pg (ref 26.0–34.0)
MCHC: 32.5 g/dL (ref 30.0–36.0)
MCV: 97 fL (ref 80.0–100.0)
Monocytes Absolute: 0.1 10*3/uL (ref 0.1–1.0)
Monocytes Relative: 1 %
Neutro Abs: 5.4 10*3/uL (ref 1.7–7.7)
Neutrophils Relative %: 92 %
Platelets: 119 10*3/uL — ABNORMAL LOW (ref 150–400)
RBC: 4.35 MIL/uL (ref 4.22–5.81)
RDW: 13.7 % (ref 11.5–15.5)
WBC: 5.9 10*3/uL (ref 4.0–10.5)
nRBC: 0 % (ref 0.0–0.2)

## 2019-05-11 MED ORDER — PEGFILGRASTIM-CBQV 6 MG/0.6ML ~~LOC~~ SOSY
6.0000 mg | PREFILLED_SYRINGE | Freq: Once | SUBCUTANEOUS | Status: AC
  Administered 2019-05-11: 6 mg via SUBCUTANEOUS

## 2019-05-11 MED ORDER — HEPARIN SOD (PORK) LOCK FLUSH 100 UNIT/ML IV SOLN
500.0000 [IU] | Freq: Once | INTRAVENOUS | Status: AC
  Administered 2019-05-11: 16:00:00 500 [IU] via INTRAVENOUS

## 2019-05-11 MED ORDER — SODIUM CHLORIDE 0.9 % IV SOLN: INTRAVENOUS | Status: DC

## 2019-05-11 MED ORDER — PEGFILGRASTIM-CBQV 6 MG/0.6ML ~~LOC~~ SOSY
PREFILLED_SYRINGE | SUBCUTANEOUS | Status: AC
  Filled 2019-05-11: qty 0.6

## 2019-05-11 NOTE — Progress Notes (Signed)
Labs reviewed with MD. Will give one liter of fluids today over 2 hours.   Message sent to Venita Lick RN to ask Dr. Raliegh Ip to write a script for a walker and transport chair.   Udenyca given per orders.  See MAR    Patient tolerated it well without problems. Vitals stable and discharged home from clinic via wheechair. Follow up as scheduled.

## 2019-05-12 ENCOUNTER — Other Ambulatory Visit (HOSPITAL_COMMUNITY): Payer: Self-pay | Admitting: *Deleted

## 2019-05-12 ENCOUNTER — Ambulatory Visit (HOSPITAL_COMMUNITY)
Admission: RE | Admit: 2019-05-12 | Discharge: 2019-05-12 | Disposition: A | Discharge status: Home / Self Care | Payer: Medicare Other | Source: Ambulatory Visit | Attending: Hematology | Admitting: Hematology

## 2019-05-12 DIAGNOSIS — R2681 Unsteadiness on feet: Secondary | ICD-10-CM

## 2019-05-12 DIAGNOSIS — C3491 Malignant neoplasm of unspecified part of right bronchus or lung: Secondary | ICD-10-CM | POA: Insufficient documentation

## 2019-05-12 DIAGNOSIS — D72819 Decreased white blood cell count, unspecified: Secondary | ICD-10-CM | POA: Diagnosis not present

## 2019-05-12 DIAGNOSIS — J189 Pneumonia, unspecified organism: Secondary | ICD-10-CM | POA: Diagnosis not present

## 2019-05-12 MED ORDER — GADOBUTROL 1 MMOL/ML IV SOLN
10.0000 mL | Freq: Once | INTRAVENOUS | Status: AC | PRN
  Administered 2019-05-12: 10 mL via INTRAVENOUS

## 2019-05-13 ENCOUNTER — Other Ambulatory Visit (HOSPITAL_COMMUNITY): Payer: Self-pay | Admitting: *Deleted

## 2019-05-13 ENCOUNTER — Ambulatory Visit: Payer: Medicare Other | Admitting: Pulmonary Disease

## 2019-05-13 MED ORDER — MISC. DEVICES MISC: 1.0000 | Freq: Once | 0 refills | Status: AC

## 2019-05-15 ENCOUNTER — Other Ambulatory Visit: Payer: Self-pay

## 2019-05-15 ENCOUNTER — Emergency Department (HOSPITAL_COMMUNITY): Payer: Medicare Other

## 2019-05-15 ENCOUNTER — Encounter (HOSPITAL_COMMUNITY): Payer: Self-pay

## 2019-05-15 ENCOUNTER — Inpatient Hospital Stay (HOSPITAL_COMMUNITY)
Admission: EM | Admit: 2019-05-15 | Discharge: 2019-05-18 | DRG: 194 | Disposition: A | Discharge status: Home Health | Payer: Medicare Other | Attending: Family Medicine | Admitting: Family Medicine

## 2019-05-15 DIAGNOSIS — J189 Pneumonia, unspecified organism: Principal | ICD-10-CM | POA: Diagnosis present

## 2019-05-15 DIAGNOSIS — Z95828 Presence of other vascular implants and grafts: Secondary | ICD-10-CM

## 2019-05-15 DIAGNOSIS — C3411 Malignant neoplasm of upper lobe, right bronchus or lung: Secondary | ICD-10-CM | POA: Diagnosis present

## 2019-05-15 DIAGNOSIS — D696 Thrombocytopenia, unspecified: Secondary | ICD-10-CM | POA: Diagnosis present

## 2019-05-15 DIAGNOSIS — Z20822 Contact with and (suspected) exposure to covid-19: Secondary | ICD-10-CM | POA: Diagnosis present

## 2019-05-15 DIAGNOSIS — Z7951 Long term (current) use of inhaled steroids: Secondary | ICD-10-CM | POA: Diagnosis not present

## 2019-05-15 DIAGNOSIS — I4821 Permanent atrial fibrillation: Secondary | ICD-10-CM | POA: Diagnosis not present

## 2019-05-15 DIAGNOSIS — I4891 Unspecified atrial fibrillation: Secondary | ICD-10-CM | POA: Diagnosis present

## 2019-05-15 DIAGNOSIS — D709 Neutropenia, unspecified: Secondary | ICD-10-CM | POA: Diagnosis not present

## 2019-05-15 DIAGNOSIS — C3490 Malignant neoplasm of unspecified part of unspecified bronchus or lung: Secondary | ICD-10-CM | POA: Diagnosis not present

## 2019-05-15 DIAGNOSIS — E1169 Type 2 diabetes mellitus with other specified complication: Secondary | ICD-10-CM | POA: Diagnosis present

## 2019-05-15 DIAGNOSIS — T451X5A Adverse effect of antineoplastic and immunosuppressive drugs, initial encounter: Secondary | ICD-10-CM | POA: Diagnosis present

## 2019-05-15 DIAGNOSIS — J9611 Chronic respiratory failure with hypoxia: Secondary | ICD-10-CM | POA: Diagnosis present

## 2019-05-15 DIAGNOSIS — D72819 Decreased white blood cell count, unspecified: Secondary | ICD-10-CM | POA: Diagnosis present

## 2019-05-15 DIAGNOSIS — I110 Hypertensive heart disease with heart failure: Secondary | ICD-10-CM | POA: Diagnosis present

## 2019-05-15 DIAGNOSIS — I482 Chronic atrial fibrillation, unspecified: Secondary | ICD-10-CM | POA: Diagnosis present

## 2019-05-15 DIAGNOSIS — C7931 Secondary malignant neoplasm of brain: Secondary | ICD-10-CM | POA: Diagnosis present

## 2019-05-15 DIAGNOSIS — J440 Chronic obstructive pulmonary disease with acute lower respiratory infection: Secondary | ICD-10-CM | POA: Diagnosis present

## 2019-05-15 DIAGNOSIS — Z6833 Body mass index (BMI) 33.0-33.9, adult: Secondary | ICD-10-CM | POA: Diagnosis not present

## 2019-05-15 DIAGNOSIS — D701 Agranulocytosis secondary to cancer chemotherapy: Secondary | ICD-10-CM | POA: Diagnosis present

## 2019-05-15 DIAGNOSIS — I10 Essential (primary) hypertension: Secondary | ICD-10-CM | POA: Diagnosis not present

## 2019-05-15 DIAGNOSIS — E669 Obesity, unspecified: Secondary | ICD-10-CM | POA: Diagnosis present

## 2019-05-15 DIAGNOSIS — E1165 Type 2 diabetes mellitus with hyperglycemia: Secondary | ICD-10-CM | POA: Diagnosis present

## 2019-05-15 DIAGNOSIS — I428 Other cardiomyopathies: Secondary | ICD-10-CM | POA: Diagnosis present

## 2019-05-15 DIAGNOSIS — J9621 Acute and chronic respiratory failure with hypoxia: Secondary | ICD-10-CM | POA: Diagnosis not present

## 2019-05-15 DIAGNOSIS — Z794 Long term (current) use of insulin: Secondary | ICD-10-CM

## 2019-05-15 DIAGNOSIS — Z7901 Long term (current) use of anticoagulants: Secondary | ICD-10-CM | POA: Diagnosis not present

## 2019-05-15 DIAGNOSIS — I5032 Chronic diastolic (congestive) heart failure: Secondary | ICD-10-CM | POA: Diagnosis present

## 2019-05-15 DIAGNOSIS — Z87891 Personal history of nicotine dependence: Secondary | ICD-10-CM | POA: Diagnosis not present

## 2019-05-15 DIAGNOSIS — R0600 Dyspnea, unspecified: Secondary | ICD-10-CM

## 2019-05-15 DIAGNOSIS — R0602 Shortness of breath: Secondary | ICD-10-CM

## 2019-05-15 DIAGNOSIS — Z9981 Dependence on supplemental oxygen: Secondary | ICD-10-CM | POA: Diagnosis not present

## 2019-05-15 DIAGNOSIS — Z79899 Other long term (current) drug therapy: Secondary | ICD-10-CM | POA: Diagnosis not present

## 2019-05-15 LAB — LACTIC ACID, PLASMA
Lactic Acid, Venous: 1.6 mmol/L (ref 0.5–1.9)
Lactic Acid, Venous: 1.2 mmol/L (ref 0.5–1.9)

## 2019-05-15 LAB — URINALYSIS, ROUTINE W REFLEX MICROSCOPIC
Bacteria, UA: NONE SEEN
Bilirubin Urine: NEGATIVE
Glucose, UA: 150 mg/dL — AB
Hgb urine dipstick: NEGATIVE
Ketones, ur: 5 mg/dL — AB
Leukocytes,Ua: NEGATIVE
Nitrite: NEGATIVE
Protein, ur: 30 mg/dL — AB
Specific Gravity, Urine: 1.026 (ref 1.005–1.030)
pH: 5 (ref 5.0–8.0)

## 2019-05-15 LAB — HIV ANTIBODY (ROUTINE TESTING W REFLEX): HIV Screen 4th Generation wRfx: NONREACTIVE

## 2019-05-15 LAB — BASIC METABOLIC PANEL
Anion gap: 9 (ref 5–15)
BUN: 19 mg/dL (ref 8–23)
CO2: 27 mmol/L (ref 22–32)
Calcium: 8.8 mg/dL — ABNORMAL LOW (ref 8.9–10.3)
Chloride: 98 mmol/L (ref 98–111)
Creatinine, Ser: 0.82 mg/dL (ref 0.61–1.24)
GFR calc Af Amer: >60 mL/min (ref >60)
GFR calc non Af Amer: >60 mL/min (ref >60)
Glucose, Bld: 186 mg/dL — ABNORMAL HIGH (ref 70–99)
Potassium: 3.9 mmol/L (ref 3.5–5.1)
Sodium: 134 mmol/L — ABNORMAL LOW (ref 135–145)

## 2019-05-15 LAB — CBC WITH DIFFERENTIAL/PLATELET
Abs Immature Granulocytes: 0.01 10*3/uL (ref 0.00–0.07)
Basophils Absolute: 0 10*3/uL (ref 0.0–0.1)
Basophils Relative: 2 %
Eosinophils Absolute: 0 10*3/uL (ref 0.0–0.5)
Eosinophils Relative: 4 %
HCT: 40.6 % (ref 39.0–52.0)
Hemoglobin: 12.8 g/dL — ABNORMAL LOW (ref 13.0–17.0)
Immature Granulocytes: 2 %
Lymphocytes Relative: 40 %
Lymphs Abs: 0.3 10*3/uL — ABNORMAL LOW (ref 0.7–4.0)
MCH: 30.6 pg (ref 26.0–34.0)
MCHC: 31.5 g/dL (ref 30.0–36.0)
MCV: 97.1 fL (ref 80.0–100.0)
Monocytes Absolute: 0.2 10*3/uL (ref 0.1–1.0)
Monocytes Relative: 24 %
Neutro Abs: 0.2 10*3/uL — ABNORMAL LOW (ref 1.7–7.7)
Neutrophils Relative %: 28 %
Platelets: 60 10*3/uL — ABNORMAL LOW (ref 150–400)
RBC: 4.18 MIL/uL — ABNORMAL LOW (ref 4.22–5.81)
RDW: 13.6 % (ref 11.5–15.5)
WBC: 0.7 10*3/uL — CL (ref 4.0–10.5)
nRBC: 0 % (ref 0.0–0.2)

## 2019-05-15 LAB — BRAIN NATRIURETIC PEPTIDE: B Natriuretic Peptide: 48 pg/mL (ref 0.0–100.0)

## 2019-05-15 LAB — RESPIRATORY PANEL BY RT PCR (FLU A&B, COVID)
Influenza A by PCR: NEGATIVE
Influenza B by PCR: NEGATIVE
SARS Coronavirus 2 by RT PCR: NEGATIVE

## 2019-05-15 LAB — GLUCOSE, CAPILLARY
Glucose-Capillary: 147 mg/dL — ABNORMAL HIGH (ref 70–99)
Glucose-Capillary: 137 mg/dL — ABNORMAL HIGH (ref 70–99)

## 2019-05-15 LAB — MRSA PCR SCREENING: MRSA by PCR: NEGATIVE

## 2019-05-15 MED ORDER — GUAIFENESIN 100 MG/5ML PO SYRP
200.0000 mg | ORAL_SOLUTION | Freq: Every evening | ORAL | Status: DC | PRN
  Filled 2019-05-15: qty 10

## 2019-05-15 MED ORDER — INSULIN ASPART 100 UNIT/ML ~~LOC~~ SOLN
0.0000 [IU] | Freq: Every day | SUBCUTANEOUS | Status: DC
  Administered 2019-05-16 – 2019-05-17 (×2): 3 [IU] via SUBCUTANEOUS

## 2019-05-15 MED ORDER — SODIUM CHLORIDE 0.9% FLUSH: 3.0000 mL | INTRAVENOUS | Status: DC | PRN

## 2019-05-15 MED ORDER — SENNOSIDES-DOCUSATE SODIUM 8.6-50 MG PO TABS
2.0000 | ORAL_TABLET | Freq: Two times a day (BID) | ORAL | Status: DC
  Administered 2019-05-15 – 2019-05-18 (×6): 2 via ORAL
  Filled 2019-05-15 (×6): qty 2

## 2019-05-15 MED ORDER — SODIUM CHLORIDE 0.9 % IV SOLN
2.0000 g | Freq: Three times a day (TID) | INTRAVENOUS | Status: DC
  Administered 2019-05-15 – 2019-05-18 (×10): 2 g via INTRAVENOUS
  Filled 2019-05-15 (×10): qty 2

## 2019-05-15 MED ORDER — ALPRAZOLAM 0.5 MG PO TABS
0.5000 mg | ORAL_TABLET | Freq: Two times a day (BID) | ORAL | Status: DC | PRN
  Administered 2019-05-17: 0.5 mg via ORAL
  Filled 2019-05-15: qty 1

## 2019-05-15 MED ORDER — UMECLIDINIUM BROMIDE 62.5 MCG/INH IN AEPB
1.0000 | INHALATION_SPRAY | Freq: Every day | RESPIRATORY_TRACT | Status: DC
  Administered 2019-05-16 – 2019-05-18 (×3): 1 via RESPIRATORY_TRACT
  Filled 2019-05-15: qty 7

## 2019-05-15 MED ORDER — POLYETHYLENE GLYCOL 3350 17 G PO PACK
17.0000 g | PACK | Freq: Every day | ORAL | Status: DC | PRN
  Administered 2019-05-16: 17:00:00 17 g via ORAL
  Filled 2019-05-15: qty 1

## 2019-05-15 MED ORDER — IPRATROPIUM-ALBUTEROL 0.5-2.5 (3) MG/3ML IN SOLN: 3.0000 mL | Freq: Once | RESPIRATORY_TRACT | Status: DC

## 2019-05-15 MED ORDER — FLUTICASONE-UMECLIDIN-VILANT 100-62.5-25 MCG/INH IN AEPB: 1.0000 | INHALATION_SPRAY | Freq: Every day | RESPIRATORY_TRACT | Status: DC

## 2019-05-15 MED ORDER — INSULIN GLARGINE 100 UNIT/ML SOLOSTAR PEN: 20.0000 [IU] | PEN_INJECTOR | Freq: Two times a day (BID) | SUBCUTANEOUS | Status: DC

## 2019-05-15 MED ORDER — ALBUTEROL SULFATE HFA 108 (90 BASE) MCG/ACT IN AERS: 2.0000 | INHALATION_SPRAY | RESPIRATORY_TRACT | Status: DC | PRN

## 2019-05-15 MED ORDER — SODIUM CHLORIDE 0.9% FLUSH
3.0000 mL | Freq: Two times a day (BID) | INTRAVENOUS | Status: DC
  Administered 2019-05-15 – 2019-05-18 (×5): 3 mL via INTRAVENOUS

## 2019-05-15 MED ORDER — LACTULOSE 10 GM/15ML PO SOLN
30.0000 g | Freq: Once | ORAL | Status: AC
  Administered 2019-05-15: 19:00:00 30 g via ORAL
  Filled 2019-05-15: qty 60

## 2019-05-15 MED ORDER — GABAPENTIN 300 MG PO CAPS
600.0000 mg | ORAL_CAPSULE | Freq: Two times a day (BID) | ORAL | Status: DC
  Administered 2019-05-15 – 2019-05-18 (×6): 600 mg via ORAL
  Filled 2019-05-15 (×6): qty 2

## 2019-05-15 MED ORDER — ALBUTEROL SULFATE (2.5 MG/3ML) 0.083% IN NEBU
2.5000 mg | INHALATION_SOLUTION | RESPIRATORY_TRACT | Status: DC | PRN
  Administered 2019-05-16 – 2019-05-18 (×2): 2.5 mg via RESPIRATORY_TRACT
  Filled 2019-05-15 (×2): qty 3

## 2019-05-15 MED ORDER — ADULT MULTIVITAMIN W/MINERALS CH: 1.0000 | ORAL_TABLET | Freq: Every day | ORAL | Status: DC

## 2019-05-15 MED ORDER — METOPROLOL SUCCINATE ER 50 MG PO TB24
100.0000 mg | ORAL_TABLET | Freq: Every day | ORAL | Status: DC
  Administered 2019-05-16 – 2019-05-18 (×3): 100 mg via ORAL
  Filled 2019-05-15 (×3): qty 2

## 2019-05-15 MED ORDER — SPIRONOLACTONE 12.5 MG HALF TABLET
12.5000 mg | ORAL_TABLET | Freq: Two times a day (BID) | ORAL | Status: DC
  Administered 2019-05-15: 12.5 mg via ORAL
  Filled 2019-05-15 (×6): qty 1

## 2019-05-15 MED ORDER — ADULT MULTIVITAMIN W/MINERALS CH
1.0000 | ORAL_TABLET | Freq: Every day | ORAL | Status: DC
  Administered 2019-05-15 – 2019-05-18 (×4): 1 via ORAL
  Filled 2019-05-15 (×4): qty 1

## 2019-05-15 MED ORDER — CARBOXYMETHYLCELLUL-GLYCERIN 0.5-0.9 % OP SOLN: 2.0000 [drp] | Freq: Every day | OPHTHALMIC | Status: DC | PRN

## 2019-05-15 MED ORDER — HYDROXYZINE HCL 25 MG PO TABS
25.0000 mg | ORAL_TABLET | Freq: Three times a day (TID) | ORAL | Status: DC
  Administered 2019-05-15 – 2019-05-18 (×8): 25 mg via ORAL
  Filled 2019-05-15 (×8): qty 1

## 2019-05-15 MED ORDER — TIOTROPIUM BROMIDE MONOHYDRATE 18 MCG IN CAPS: 18.0000 ug | ORAL_CAPSULE | Freq: Every day | RESPIRATORY_TRACT | Status: DC

## 2019-05-15 MED ORDER — SODIUM CHLORIDE 0.9 % IV BOLUS
1000.0000 mL | Freq: Once | INTRAVENOUS | Status: AC
  Administered 2019-05-15: 1000 mL via INTRAVENOUS

## 2019-05-15 MED ORDER — DILTIAZEM HCL ER 240 MG PO CP24
240.0000 mg | ORAL_CAPSULE | Freq: Every day | ORAL | Status: DC
  Administered 2019-05-15 – 2019-05-17 (×3): 240 mg via ORAL
  Filled 2019-05-15 (×10): qty 1

## 2019-05-15 MED ORDER — BISACODYL 10 MG RE SUPP
10.0000 mg | Freq: Once | RECTAL | Status: AC
  Administered 2019-05-15: 10 mg via RECTAL
  Filled 2019-05-15: qty 1

## 2019-05-15 MED ORDER — DOFETILIDE 125 MCG PO CAPS
250.0000 ug | ORAL_CAPSULE | Freq: Two times a day (BID) | ORAL | Status: DC
  Administered 2019-05-15 – 2019-05-18 (×6): 250 ug via ORAL
  Filled 2019-05-15: qty 1
  Filled 2019-05-15: qty 2
  Filled 2019-05-15 (×3): qty 1
  Filled 2019-05-15 (×2): qty 2
  Filled 2019-05-15: qty 1
  Filled 2019-05-15 (×6): qty 2
  Filled 2019-05-15: qty 1
  Filled 2019-05-15: qty 2

## 2019-05-15 MED ORDER — FLUTICASONE FUROATE-VILANTEROL 100-25 MCG/INH IN AEPB
1.0000 | INHALATION_SPRAY | Freq: Every day | RESPIRATORY_TRACT | Status: DC
  Administered 2019-05-16 – 2019-05-18 (×3): 1 via RESPIRATORY_TRACT
  Filled 2019-05-15: qty 28

## 2019-05-15 MED ORDER — SODIUM CHLORIDE 0.9 % IV SOLN
250.0000 mL | INTRAVENOUS | Status: DC | PRN
  Administered 2019-05-15 – 2019-05-16 (×2): 250 mL via INTRAVENOUS

## 2019-05-15 MED ORDER — PROCHLORPERAZINE MALEATE 5 MG PO TABS: 10.0000 mg | ORAL_TABLET | Freq: Four times a day (QID) | ORAL | Status: DC | PRN

## 2019-05-15 MED ORDER — TAMSULOSIN HCL 0.4 MG PO CAPS
0.4000 mg | ORAL_CAPSULE | Freq: Two times a day (BID) | ORAL | Status: DC
  Administered 2019-05-15 – 2019-05-18 (×6): 0.4 mg via ORAL
  Filled 2019-05-15 (×6): qty 1

## 2019-05-15 MED ORDER — TRAZODONE HCL 50 MG PO TABS
50.0000 mg | ORAL_TABLET | Freq: Every day | ORAL | Status: DC
  Administered 2019-05-15 – 2019-05-17 (×3): 50 mg via ORAL
  Filled 2019-05-15 (×3): qty 1

## 2019-05-15 MED ORDER — HYDROXYZINE PAMOATE 25 MG PO CAPS
25.0000 mg | ORAL_CAPSULE | Freq: Three times a day (TID) | ORAL | Status: DC
  Filled 2019-05-15 (×9): qty 1

## 2019-05-15 MED ORDER — BIOTENE DRY MOUTH MT LIQD: 15.0000 mL | OROMUCOSAL | Status: DC | PRN

## 2019-05-15 MED ORDER — PREGABALIN 75 MG PO CAPS
150.0000 mg | ORAL_CAPSULE | Freq: Every day | ORAL | Status: DC
  Administered 2019-05-16 – 2019-05-18 (×3): 150 mg via ORAL
  Filled 2019-05-15 (×3): qty 2
  Filled 2019-05-15: qty 6

## 2019-05-15 MED ORDER — ONDANSETRON HCL 4 MG/2ML IJ SOLN: 4.0000 mg | Freq: Four times a day (QID) | INTRAMUSCULAR | Status: DC | PRN

## 2019-05-15 MED ORDER — PREGABALIN 75 MG PO CAPS
150.0000 mg | ORAL_CAPSULE | Freq: Every day | ORAL | Status: DC
  Administered 2019-05-15: 150 mg via ORAL

## 2019-05-15 MED ORDER — HYDROCODONE-ACETAMINOPHEN 5-325 MG PO TABS: 1.0000 | ORAL_TABLET | ORAL | Status: DC | PRN

## 2019-05-15 MED ORDER — IPRATROPIUM-ALBUTEROL 0.5-2.5 (3) MG/3ML IN SOLN
3.0000 mL | RESPIRATORY_TRACT | Status: DC | PRN
  Administered 2019-05-15 – 2019-05-17 (×8): 3 mL via RESPIRATORY_TRACT
  Filled 2019-05-15 (×9): qty 3

## 2019-05-15 MED ORDER — INSULIN GLARGINE 100 UNIT/ML ~~LOC~~ SOLN
20.0000 [IU] | Freq: Two times a day (BID) | SUBCUTANEOUS | Status: DC
  Administered 2019-05-16 – 2019-05-18 (×6): 20 [IU] via SUBCUTANEOUS
  Filled 2019-05-15 (×8): qty 0.2

## 2019-05-15 MED ORDER — ATORVASTATIN CALCIUM 20 MG PO TABS
20.0000 mg | ORAL_TABLET | Freq: Every day | ORAL | Status: DC
  Administered 2019-05-15 – 2019-05-18 (×4): 20 mg via ORAL
  Filled 2019-05-15 (×4): qty 1

## 2019-05-15 MED ORDER — ONDANSETRON HCL 4 MG PO TABS: 4.0000 mg | ORAL_TABLET | Freq: Four times a day (QID) | ORAL | Status: DC | PRN

## 2019-05-15 MED ORDER — INSULIN ASPART 100 UNIT/ML ~~LOC~~ SOLN
0.0000 [IU] | Freq: Three times a day (TID) | SUBCUTANEOUS | Status: DC
  Administered 2019-05-15: 2 [IU] via SUBCUTANEOUS
  Administered 2019-05-16 (×3): 3 [IU] via SUBCUTANEOUS
  Administered 2019-05-17: 12:00:00 5 [IU] via SUBCUTANEOUS
  Administered 2019-05-17 (×2): 3 [IU] via SUBCUTANEOUS
  Administered 2019-05-18 (×2): 5 [IU] via SUBCUTANEOUS
  Administered 2019-05-18: 09:00:00 2 [IU] via SUBCUTANEOUS

## 2019-05-15 MED ORDER — BIOTENE DRY MOUTH MOISTURIZING MT SOLN: 1.0000 | OROMUCOSAL | Status: DC | PRN

## 2019-05-15 MED ORDER — POLYVINYL ALCOHOL 1.4 % OP SOLN: 1.0000 [drp] | Freq: Every day | OPHTHALMIC | Status: DC | PRN

## 2019-05-15 NOTE — Progress Notes (Signed)
Unable to perform nebulizer treatment due to no current COVID test on file

## 2019-05-15 NOTE — ED Provider Notes (Signed)
[facility] EMERGENCY DEPARTMENT Provider Note  CSN: [id number] Arrival date & time: 05/15/19  1047   History CC: Weakness  Chase Herrera is a 69 y.o. male with a history of small cell lung cancer on chemotherapy, type 2 diabetes, A. Fib, COPD on home oxygen, also on Eliquis, presenting to emergency department with generalized fatigue.  Patient reports that he has been feeling weaker than normal for the past week or so.  He feels very strongly that this is a side effect of his chemotherapy which he last received 2 weeks ago.  He normally gets infusions every 3 weeks.  He says he is felt exhausted since beginning his chemotherapy.  However yesterday had a mechanical fall walking out of the house and subsequently went to another emergency department.  He said he spent about 12 hours in the emergency department and had a broad work-up including a CT scan of his head, which he tells me was "normal," and then became upset when there was a significant delay in them giving him his DuoNeb treatments.  He left the emergency department and wanted to go home but his sister convinced him to come to our emergency department because his medical oncologist works at [employer].  He said he is simply here "to get a medical screening" and says he wants to go home "if things look okay."    He does live by himself and feels like he is in need of more assistance at home moving forward, but not urgently.  He still able to get around the house.  He can get to his appointments on time.  HPI     Past Medical History:  Diagnosis Date  . Alcohol abuse    Quit 08/23/11  . Arthritis   . Atrial fibrillation ([place])   . Atrial flutter ([place])    CTI ablation by Dr [name] 10/2011  . Bacteremia due to Gram-positive bacteria 11/29/2016  . CHF (congestive heart failure) ([place])   . COPD (chronic obstructive pulmonary disease) ([place])   . Depression   . Essential hypertension   . History of cardiomyopathy    LVEF 25-30% 08/2011 with  subsequent normalization  . History of kidney stones   . Obesity   . Prostate enlargement   . Small cell lung cancer ([place])    Right upper lobe - follows with [facility]  . Type 2 diabetes mellitus [facility])     Patient Active Problem List   Diagnosis Date Noted  . Leukopenia 05/15/2019  . Goals of care, counseling/discussion 04/24/2019  . Small cell carcinoma of lung, right ([place])   . Depression 04/07/2019  . Acute pulmonary embolism ([place]) 02/01/2019  . Lung abscess ([place]) 02/01/2019  . CHF (congestive heart failure) ([place]) 03/15/2018  . Chronic diastolic CHF (congestive heart failure) ([place]) 10/28/2017  . H/O adenomatous polyp of colon 04/29/2017  . Port-A-Cath in place   . Malignant neoplasm of lung ([place])   . Aortic atherosclerosis ([place]) 04/23/2017  . Hepatic steatosis 04/23/2017  . Type 2 diabetes mellitus ([place])   . Essential hypertension   . Atrial fibrillation ([place]) 11/29/2016  . Diabetes mellitus type 2 in obese ([place]) 11/29/2016  . Healthcare-associated pneumonia 11/29/2016  . Small cell lung cancer ([place]) 04/03/2016  . COPD (chronic obstructive pulmonary disease) ([place]) 02/08/2012  . Atrial flutter ([place]) 01/06/2012  . Nonischemic cardiomyopathy ([place]) 10/15/2011  . Tobacco abuse 10/15/2011    Past Surgical History:  Procedure Laterality Date  . APPENDECTOMY    .  ATRIAL ABLATION SURGERY  11/06/2011   CTI ablation for atrial flutter by Dr [name]  . ATRIAL FLUTTER ABLATION N/A 11/06/2011   Procedure: ATRIAL FLUTTER ABLATION;  Surgeon: Thompson Grayer, MD;  Location: [facility] CATH LAB;  Service: Cardiovascular;  Laterality: N/A;  . Cataracts    . COLONOSCOPY WITH PROPOFOL N/A 06/06/2017   Procedure: COLONOSCOPY WITH PROPOFOL;  Surgeon: [name], MD;  Location: [facility] ENDO SUITE;  Service: Endoscopy;  Laterality: N/A;  1:15pm  . IR GENERIC HISTORICAL  04/09/2016   IR US GUIDE VASC ACCESS RIGHT 04/09/2016 [name], DO [facility]-INTERV RAD  . IR GENERIC HISTORICAL  04/09/2016   IR  FLUORO GUIDE PORT INSERTION RIGHT 04/09/2016 [name], DO [facility]-INTER[facility] RAD  . PORT-A-CATH REMOVAL N/A 04/24/2017   Procedure: MINOR REMOVAL PORT-A-CATH;  Surgeon: [name], MD;  Location: [facility] ORS;  Service: General;  Laterality: N/A;  . PORTACATH PLACEMENT Left 04/22/2019   Procedure: INSERTION PORT-A-CATH;  Surgeon: Aviva Signs, MD;  Location: [facility] ORS;  Service: General;  Laterality: Left;  . TONSILLECTOMY         Family History  Problem Relation Age of Onset  . Diabetes Sister   . Hypertension Mother   . Lung cancer Mother   . Hypertension Father   . Heart attack Father   . Hypertension Sister   . Hypertension Sister   . Hypertension Sister   . Pulmonary fibrosis Sister   . Breast cancer Cousin   . Colon cancer Neg Hx     Social History   Tobacco Use  . Smoking status: Former Smoker    Packs/day: 0.50    Years: 47.00    Pack years: 23.50    Types: Cigarettes    Quit date: 05/30/2012    Years since quitting: 6.9  . Smokeless tobacco: Former Systems developer    Quit date: 04/10/2014  . Tobacco comment: encouraged to quit today 03/04/12  Substance Use Topics  . Alcohol use: No    Comment: quit 3 1/2 years ago  . Drug use: Not Currently    Comment: marajuana occasionally    Home Medications Prior to Admission medications   Medication Sig Start Date End Date Taking? Authorizing Provider  ACCU-CHEK AVIVA PLUS test strip check blood sugar TWICE DAILY 01/22/19  Yes [provider]  acetaminophen (TYLENOL) 500 MG tablet Take 1,000 mg by mouth every 6 (six) hours as needed for moderate pain or headache.   Yes [provider]  apixaban (ELIQUIS) 5 MG TABS tablet Take 5 mg by mouth 2 (two) times daily.  02/13/19  Yes [provider]  AQUALANCE LANCETS 30G MISC USE TO check blood glucose twice daily 06/19/17  Yes [provider]  Artificial Saliva (BIOTENE DRY MOUTH MOISTURIZING) SOLN Apply 1 Dose topically as needed (dry mouth).  02/10/19  Yes  [provider]  atezolizumab (TECENTRIQ) 1200 MG/20ML Inject 1,200 mg into the vein every 21 ( twenty-one) days. 05/06/19  Yes [provider]  atorvastatin (LIPITOR) 20 MG tablet Take 1 tablet (20 mg total) by mouth daily. 01/03/17  Yes Hagler, Apolonio Schneiders, MD  CARBOPLATIN IV Inject into the vein every 21 ( twenty-one) days. X 4 cycles 05/06/19  Yes [provider]  Carboxymethylcellul-Glycerin (LUBRICATING EYE DROPS OP) Place 1 drop into the left eye daily as needed (dry eyes).   Yes [provider]  diazepam (VALIUM) 5 MG tablet Take 5 mg by mouth once. For MRI 04/24/19  Yes [provider]  diltiazem (DILACOR XR)  240 MG 24 hr capsule Take 240 mg by mouth daily.   Yes [provider]  dofetilide (TIKOSYN) 250 MCG capsule Take 1 capsule (250 mcg total) by mouth 2 (two) times daily. 09/15/18  Yes Satira Sark, MD  ETOPOSIDE IV Inject into the vein every 21 ( twenty-one) days. Days 1-3 every 21 days x 4 cycles 05/06/19  Yes [provider]  Fluticasone-Umeclidin-Vilant (TRELEGY ELLIPTA) 100-62.5-25 MCG/INH AEPB Inhale 1 puff into the lungs daily.   Yes [provider]  furosemide (LASIX) 40 MG tablet TAKE 1 TABLET BY MOUTH EVERY DAY Patient taking differently: Take 40 mg by mouth daily.  06/07/17  Yes Hagler, Apolonio Schneiders, MD  gabapentin (NEURONTIN) 300 MG capsule Take 600 mg by mouth 2 (two) times daily.    Yes [provider]  guaifenesin (ROBITUSSIN) 100 MG/5ML syrup Take 200 mg by mouth at bedtime as needed for cough.   Yes [provider]  hydrOXYzine (VISTARIL) 25 MG capsule Take 25 mg by mouth every 8 (eight) hours.   Yes [provider]  insulin lispro (HUMALOG) 100 UNIT/ML injection Inject 2-20 Units into the skin 3 (three) times daily with meals. Sliding scale    Yes [provider]  ipratropium-albuterol (DUONEB) 0.5-2.5 (3) MG/3ML SOLN Take 3 mLs by nebulization every 4 (four) hours as needed  (shortness of breath).    Yes [provider]  Lancet Devices (ADJUSTABLE LANCING DEVICE) MISC TO check blood glucose daily 01/06/19  Yes [provider]  LANTUS SOLOSTAR 100 UNIT/ML Solostar Pen INJECT 20 UNITS UNDER THE SKIN EVERY MORNING AND INJECT 20 UNITS UNDER THE SKIN EVERY EVENING Patient taking differently: Inject 20 Units into the skin 2 (two) times daily.  05/02/17  Yes Hagler, Apolonio Schneiders, MD  levalbuterol Penne Lash) 0.63 MG/3ML nebulizer solution Inhale 3 mLs (0.63 mg total) into the lungs 4 (four) times daily. 01/03/17  Yes Hagler, Apolonio Schneiders, MD  Lidocaine (SALONPAS PAIN RELIEVING EX) Apply 1 patch topically daily.    Yes [provider]  lidocaine-prilocaine (EMLA) cream Apply a small amount to port a cath site and cover with plastic wrap 1 hour prior to chemotherapy appointments 05/06/19  Yes Derek Jack, MD  metFORMIN (GLUCOPHAGE) 500 MG tablet Take 500 mg by mouth in the morning and at bedtime.   Yes [provider]  metoprolol succinate (TOPROL-XL) 100 MG 24 hr tablet Take 100 mg by mouth daily. 02/13/19  Yes [provider]  Multiple Vitamin (MULTIVITAMIN WITH MINERALS) TABS tablet Take 1 tablet by mouth daily.   Yes [provider]  potassium chloride (KLOR-CON) 10 MEQ tablet Take 10 mEq by mouth 2 (two) times daily. 02/13/19  Yes [provider]  pregabalin (LYRICA) 150 MG capsule Take 150 mg by mouth in the morning and at bedtime.   Yes [provider]  PROAIR HFA 108 (90 Base) MCG/ACT inhaler INHALE TWO PUFFS BY MOUTH EVERY 4 HOURS AS NEEDED FOR WHEEZING Patient taking differently: Inhale 2 puffs into the lungs every 4 (four) hours as needed (wheezing/shortness of breath.).  06/11/17  Yes [name], MD  prochlorperazine (COMPAZINE) 10 MG tablet Take 1 tablet (10 mg total) by mouth every 6 (six) hours as needed (Nausea or vomiting). 05/06/19  Yes Derek Jack, MD  senna (SENOKOT) 8.6 MG tablet Take 1  tablet by mouth in the morning and at bedtime.    Yes [provider]  spironolactone (ALDACTONE) 25 MG tablet Take 12.5 mg by mouth 2 (two) times daily.  03/11/19  Yes [provider]  tamsulosin (FLOMAX) 0.4 MG CAPS capsule Take 1 capsule (0.4 mg total) by mouth 2 (two) times daily after a meal. 01/03/17  Yes Hagler, Apolonio Schneiders, MD  tiotropium (SPIRIVA HANDIHALER) 18 MCG inhalation capsule Place 1 capsule (18 mcg total) into inhaler and inhale daily. Patient taking differently: Place 18 mcg into inhaler and inhale daily as needed (respiratory issues.).  03/06/17  Yes [name], MD  traZODone (DESYREL) 50 MG tablet Take 50 mg by mouth at bedtime. 04/04/17  Yes [provider]  fluticasone (FLONASE) 50 MCG/ACT nasal spray Place 2 sprays into both nostrils daily. Patient taking differently: Place 2 sprays into both nostrils daily as needed (allergies.).  03/06/17   [name], MD  HYDROcodone-acetaminophen (NORCO/VICODIN) 5-325 MG tablet Take 1 tablet by mouth every 4 (four) hours as needed. 01/18/19   Isla Pence, MD    Allergies    Patient has no known allergies.  Review of Systems   Review of Systems  Constitutional: Positive for appetite change and fatigue. Negative for fever.  Eyes: Negative for pain and visual disturbance.  Respiratory: Positive for chest tightness and shortness of breath. Negative for cough.   Cardiovascular: Negative for chest pain and palpitations.  Gastrointestinal: Positive for nausea. Negative for abdominal pain.  Genitourinary: Negative for dysuria and hematuria.  Musculoskeletal: Positive for arthralgias and myalgias.  Skin: Negative for color change and rash.  Neurological: Positive for headaches. Negative for syncope.  Psychiatric/Behavioral: Negative for agitation and confusion.  All other systems reviewed and are negative.   Physical Exam Updated Vital Signs BP 117/65 (BP Location: Right Arm)   Pulse 93   Temp 99.3 F  (37.4 C) (Oral)   Resp 18   Ht 6\' 1"  (1.854 m)   Wt 114 kg   SpO2 94%   BMI 33.16 kg/m   Physical Exam Vitals and nursing note reviewed.  Constitutional:      Appearance: He is well-developed.  HENT:     Head: Normocephalic and atraumatic.  Eyes:     Conjunctiva/sclera: Conjunctivae normal.  Cardiovascular:     Rate and Rhythm: Normal rate and regular rhythm.     Pulses: Normal pulses.  Pulmonary:     Effort: Pulmonary effort is normal. No respiratory distress.     Comments: On [place] at baseline, 95% Abdominal:     General: There is no distension.     Palpations: Abdomen is soft.     Tenderness: There is no abdominal tenderness.  Musculoskeletal:     Cervical back: Neck supple.  Skin:    General: Skin is warm and dry.  Neurological:     General: No focal deficit present.     Mental Status: He is alert and oriented to person, place, and time.  Psychiatric:        Mood and Affect: Mood normal.        Behavior: Behavior normal.     ED Results / Procedures / Treatments   Labs (all labs ordered are listed, but only abnormal results are displayed) Labs Reviewed  CBC WITH DIFFERENTIAL/PLATELET - Abnormal; Notable for the following components:      Result Value   WBC 0.7 (*)    RBC 4.18 (*)    Hemoglobin 12.8 (*)    Platelets 60 (*)    Neutro Abs 0.2 (*)    Lymphs Abs 0.3 (*)    All other components within normal limits  BASIC METABOLIC PANEL - Abnormal; Notable for  the following components:   Sodium 134 (*)    Glucose, Bld 186 (*)    Calcium 8.8 (*)    All other components within normal limits  GLUCOSE, CAPILLARY - Abnormal; Notable for the following components:   Glucose-Capillary 147 (*)    All other components within normal limits  CULTURE, BLOOD (ROUTINE X 2)  CULTURE, BLOOD (ROUTINE X 2)  RESPIRATORY PANEL BY RT PCR (FLU A&B, COVID)  URINE CULTURE  MRSA PCR SCREENING  BRAIN NATRIURETIC PEPTIDE  LACTIC ACID, PLASMA  LACTIC ACID, PLASMA  URINALYSIS,  ROUTINE W REFLEX MICROSCOPIC  HIV ANTIBODY (ROUTINE TESTING W REFLEX)  CBC    EKG EKG Interpretation  Date/Time:  Friday May 15 2019 11:07:16 EDT Ventricular Rate:  81 PR Interval:  174 QRS Duration: 76 QT Interval:  370 QTC Calculation: 429 R Axis:   -48 Text Interpretation: Normal sinus rhythm Left axis deviation Low voltage QRS Septal infarct , age undetermined Abnormal ECG No STEMI Confirmed by Octaviano Glow [id number]) on 05/15/2019 12:28:37 PM   Radiology DG Chest Port 1 View  Result Date: 05/15/2019 CLINICAL DATA:  Shortness of breath and weakness. History of diabetes and lung cancer. EXAM: PORTABLE CHEST 1 VIEW COMPARISON:  May 14, 2019 FINDINGS: The patient has a left Port-A-Cath. No pneumothorax. The heart, hila, and mediastinum are normal. No pulmonary nodules or masses identified. There is opacity in left lung base which is somewhat platelike in appearance. No other acute abnormalities are noted. IMPRESSION: 1. Platelike opacity in left base may represent atelectasis or developing infiltrate. Recommend clinical correlation and attention on follow-up. No other acute abnormalities are identified. Electronically Signed   By: Dorise Bullion III M.D   On: 05/15/2019 13:33    Procedures .Critical Care Performed by: Wyvonnia Dusky, MD Authorized by: Wyvonnia Dusky, MD   Critical care provider statement:    Critical care time (minutes):  45   Critical care was necessary to treat or prevent imminent or life-threatening deterioration of the following conditions:  Sepsis   Critical care was time spent personally by me on the following activities:  Discussions with consultants, evaluation of patient's response to treatment, examination of patient, ordering and performing treatments and interventions, ordering and review of laboratory studies, ordering and review of radiographic studies, pulse oximetry, re-evaluation of patient's condition, obtaining history from patient or  surrogate and review of old charts   (including critical care time)  Medications Ordered in ED Medications  ipratropium-albuterol (DUONEB) 0.5-2.5 (3) MG/3ML nebulizer solution 3 mL (0 mLs Nebulization Hold 05/15/19 1436)  ceFEPIme (MAXIPIME) 2 g in sodium chloride 0.9 % 100 mL IVPB (0 g Intravenous Stopped 05/15/19 1519)  tamsulosin (FLOMAX) capsule 0.4 mg (0.4 mg Oral Given 05/15/19 1843)  atorvastatin (LIPITOR) tablet 20 mg (has no administration in time range)  traZODone (DESYREL) tablet 50 mg (has no administration in time range)  ipratropium-albuterol (DUONEB) 0.5-2.5 (3) MG/3ML nebulizer solution 3 mL (has no administration in time range)  diltiazem (DILACOR XR) 24 hr capsule 240 mg (has no administration in time range)  gabapentin (NEURONTIN) capsule 600 mg (has no administration in time range)  dofetilide (TIKOSYN) capsule 250 mcg (has no administration in time range)  HYDROcodone-acetaminophen (NORCO/VICODIN) 5-325 MG per tablet 1 tablet (has no administration in time range)  metoprolol succinate (TOPROL-XL) 24 hr tablet 100 mg (has no administration in time range)  spironolactone (ALDACTONE) tablet 12.5 mg (has no administration in time range)  hydrOXYzine (VISTARIL) capsule 25 mg (has no administration  in time range)  guaifenesin (ROBITUSSIN) 100 MG/5ML syrup 200 mg (has no administration in time range)  prochlorperazine (COMPAZINE) tablet 10 mg (has no administration in time range)  senna-docusate (Senokot-S) tablet 2 tablet (has no administration in time range)  sodium chloride flush (NS) 0.9 % injection 3 mL (has no administration in time range)  sodium chloride flush (NS) 0.9 % injection 3 mL (has no administration in time range)  0.9 %  sodium chloride infusion (has no administration in time range)  polyethylene glycol (MIRALAX / GLYCOLAX) packet 17 g (has no administration in time range)  ondansetron (ZOFRAN) tablet 4 mg (has no administration in time range)    Or   ondansetron (ZOFRAN) injection 4 mg (has no administration in time range)  multivitamin with minerals tablet 1 tablet (has no administration in time range)  insulin aspart (novoLOG) injection 0-15 Units (2 Units Subcutaneous Given 05/15/19 1842)  insulin aspart (novoLOG) injection 0-5 Units (has no administration in time range)  bisacodyl (DULCOLAX) suppository 10 mg (has no administration in time range)  insulin glargine (LANTUS) injection 20 Units (has no administration in time range)  fluticasone furoate-vilanterol (BREO ELLIPTA) 100-25 MCG/INH 1 puff (has no administration in time range)    And  umeclidinium bromide (INCRUSE ELLIPTA) 62.5 MCG/INH 1 puff (has no administration in time range)  polyvinyl alcohol (LIQUIFILM TEARS) 1.4 % ophthalmic solution 1 drop (has no administration in time range)  antiseptic oral rinse (BIOTENE) solution 15 mL (has no administration in time range)  albuterol (PROVENTIL) (2.5 MG/3ML) 0.083% nebulizer solution 2.5 mg (has no administration in time range)  pregabalin (LYRICA) capsule 150 mg (has no administration in time range)  sodium chloride 0.9 % bolus 1,000 mL (0 mLs Intravenous Stopped 05/15/19 1519)  lactulose (CHRONULAC) 10 GM/15ML solution 30 g (30 g Oral Given 05/15/19 1840)    ED Course  I have reviewed the triage vital signs and the nursing notes.  Pertinent labs & imaging results that were available during my care of the patient were reviewed by me and considered in my medical decision making (see chart for details).  69 yo male w/ small cell lung cancer on IV chemotherapy, COPD, CHF, A Fib, on A/C, presenting to the ED with fatigue.  The differential is broad and contains several potentially life-threatening diagnosis.  This includes infection (neutropenic fever) vs. Anemia vs electrolyte derangement vs dehydration vs. Chemotherapy side effect vs. Other  Clinically he appears stable on arrival. He is quite unhappy to be here, and apparently had  a very long night in the [facility] ED yesterday.  Unfortunately, as I explained to him, with all of his medical problems, there are many potentially serious causes of generalized weakness.    I ordered a basic workup and infection workup initially including CBC with diff, BMP, Ua and xray Xray was personally reviewed by myself and reviewed a left lower lobe consolidation.  His labs subsequently showed neutropenia with an [place] < 200.  Although he was not amenable to a rectal temp, an oral temp of 99.50F was suggestive of neutropenic fever, with a possible pulmonary source to suggest sepsis.  I subsequently ordered blood cultures, antibiotics per the neutropenic fever protocol, and admitted to the hospitalist.  Dr Denton Brick told me that he was able to speak to the patient's oncologist at admission.  I reassessed the patient afterwards and did tell him about the diagnosis and the plan to treat.  He is willing to stay in the hospital for treatment.  Clinical Course as of May 15 1843  Fri May 15, 2019  1431 Paged dr Tomie China patients oncologist, anticipate admission for pancytopenia/neutropenia with possible PNA   [MT]  1432 Patient updated and willing to stay   [MT]  1433 ANC of 197  Neutrophils: 28 [MT]    Clinical Course User Index [MT] Langston Masker Carola Rhine, MD    Final Clinical Impression(s) / ED Diagnoses Final diagnoses:  SOB (shortness of breath)    Rx / DC Orders ED Discharge Orders    None       Wyvonnia Dusky, MD 05/15/19 1845

## 2019-05-15 NOTE — ED Triage Notes (Signed)
Pt brought in  By EMS due to weakness. Pt was seen at [place] last night and discharged. Pt said blood work and xray's were done, but was not happy because he got his neb on time. Pt did 2.5 mg albuterol PTA. Pt is on chronic O2 at 2 L via [place].Pt had increased to 5 L due to SOB prior to EMS arrival. Pt reports he fell yesterday. Also has restarted chemo 2 weeks ago for lung cancer

## 2019-05-15 NOTE — H&P (Signed)
Patient Demographics:    Chase Herrera, is a 69 y.o. male  MRN: [id number]   DOB - 1950/02/15  Admit Date - 05/15/2019  Outpatient Primary MD for the [facility] is [facility]   Assessment & Plan:    Principal Problem:   Leukopenia Active Problems:   Small cell lung cancer ([place])   Lt sided Pneumonia   Atrial fibrillation (HCC)   Nonischemic cardiomyopathy (HCC)   Diabetes mellitus type 2 in obese [facility])   Essential hypertension   Port-A-Cath in place   Chronic diastolic CHF (congestive heart failure) ([place])    1) left-sided pneumonia in an immunocompromised patient with chemo induced neutropenia--- treat empirically with IV cefepime -Check MRSA PCR -Blood cultures pending -Bronchodilators as ordered -Patient is currently not hypoxic  2) chemo induced neutropenia/leukopenia--- WBC 0.7, absolute neutrophil 0.2--- last dose of chemo 05/06/2019, patient received Neulasta/Udenyca on 05/11/2019 --- Continue to monitor CBC -Discussed with Dr. Delton Coombes no need for Neupogen at this time -Reverse barrier nursing and neutropenic precautions  3)Recurrent right-sided-small cell lung cancer initially treated in 2018--- suspicion of brain mets -Chemotherapy as above #2 - 4) chronic atrial fibrillation--- hold Eliquis due to thrombocytopenia with platelets of 60 K--- -continue Cardizem and metoprolol for rate control, Tikosyn for rhythm control  5)DM2-A1c is 10.4 reflecting uncontrolled DM PTA, give Lantus insulin 20 units twice daily, Use Novolog/Humalog Sliding scale insulin with Accu-Cheks/Fingersticks as ordered   6) COPD--- no acute exacerbation at this time, continue bronchodilators  With History of - Reviewed by me  Past Medical History:  Diagnosis Date  . Alcohol abuse    Quit  08/23/11  . Arthritis   . Atrial fibrillation ([place])   . Atrial flutter ([place])    CTI ablation by Dr [name] 10/2011  . Bacteremia due to Gram-positive bacteria 11/29/2016  . CHF (congestive heart failure) ([place])   . COPD (chronic obstructive pulmonary disease) ([place])   . Depression   . Essential hypertension   . History of cardiomyopathy    LVEF 25-30% 08/2011 with subsequent normalization  . History of kidney stones   . Obesity   . Prostate enlargement   . Small cell lung cancer ([place])    Right upper lobe - follows with [facility]  . Type 2 diabetes mellitus ([place])       Past Surgical History:  Procedure Laterality Date  . APPENDECTOMY    . ATRIAL ABLATION SURGERY  11/06/2011   CTI ablation for atrial flutter by Dr [name]  . ATRIAL FLUTTER ABLATION N/A 11/06/2011   Procedure: ATRIAL FLUTTER ABLATION;  Surgeon: Thompson Grayer, MD;  Location: [facility] CATH LAB;  Service: Cardiovascular;  Laterality: N/A;  . Cataracts    . COLONOSCOPY WITH PROPOFOL N/A 06/06/2017   Procedure: COLONOSCOPY WITH PROPOFOL;  Surgeon: [name], MD;  Location: [facility] ENDO SUITE;  Service: Endoscopy;  Laterality: N/A;  1:15pm  . IR GENERIC HISTORICAL  04/09/2016  IR US GUIDE VASC ACCESS RIGHT 04/09/2016 [name], DO [facility]-INTERV RAD  . IR GENERIC HISTORICAL  04/09/2016   IR FLUORO GUIDE PORT INSERTION RIGHT 04/09/2016 [name], DO [facility]-INTER[facility] RAD  . PORT-A-CATH REMOVAL N/A 04/24/2017   Procedure: MINOR REMOVAL PORT-A-CATH;  Surgeon: [name], MD;  Location: [facility] ORS;  Service: General;  Laterality: N/A;  . PORTACATH PLACEMENT Left 04/22/2019   Procedure: INSERTION PORT-A-CATH;  Surgeon: Aviva Signs, MD;  Location: [facility] ORS;  Service: General;  Laterality: Left;  . TONSILLECTOMY        Chief Complaint  Patient presents with  . Extremity Weakness      HPI:    Chase Herrera  is a 69 y.o. male with past medical history relevant for recurrent right-sided small cell cancer of the lung, chronic atrial  fibrillation, DM, HTN, obesity, COPD and a reformed smoker who presents to the ED after being [facility] in ED with complaints of progressive weakness and fatigue since getting chemo on 05/06/2019  --In the ED is found to have clinically and radiologically left-sided pneumonia with leukopenia and neutropenia with a white count of 0.7, and absolute neutrophil 0.2  -Discussed with patient's oncologist Dr. Delton Coombes -Recommended admission for IV antibiotics pending blood cultures -No significant hypoxia at this time patient does have some cough -Low-grade temp noted   No Nausea, Vomiting or Diarrhea     Review of systems:    In addition to the HPI above,   A full Review of  Systems was done, all other systems reviewed are negative except as noted above in HPI , .    Social History:  Reviewed by me    Social History   Tobacco Use  . Smoking status: Former Smoker    Packs/day: 0.50    Years: 47.00    Pack years: 23.50    Types: Cigarettes    Quit date: 05/30/2012    Years since quitting: 6.9  . Smokeless tobacco: Former Systems developer    Quit date: 04/10/2014  . Tobacco comment: encouraged to quit today 03/04/12  Substance Use Topics  . Alcohol use: No    Comment: quit 3 1/2 years ago     Family History :  Reviewed by me    Family History  Problem Relation Age of Onset  . Diabetes Sister   . Hypertension Mother   . Lung cancer Mother   . Hypertension Father   . Heart attack Father   . Hypertension Sister   . Hypertension Sister   . Hypertension Sister   . Pulmonary fibrosis Sister   . Breast cancer Cousin   . Colon cancer Neg Hx      Home Medications:   Prior to Admission medications   Medication Sig Start Date End Date Taking? Authorizing Provider  ACCU-CHEK AVIVA PLUS test strip check blood sugar TWICE DAILY 01/22/19  Yes [provider]  acetaminophen (TYLENOL) 500 MG tablet Take 1,000 mg by mouth every 6 (six) hours as  needed for moderate pain or headache.   Yes [provider]  apixaban (ELIQUIS) 5 MG TABS tablet Take 5 mg by mouth 2 (two) times daily.  02/13/19  Yes [provider]  AQUALANCE LANCETS 30G MISC USE TO check blood glucose twice daily 06/19/17  Yes [provider]  Artificial Saliva (BIOTENE DRY MOUTH MOISTURIZING) SOLN Apply 1 Dose topically as needed (dry mouth).  02/10/19  Yes [provider]  atezolizumab (TECENTRIQ) 1200 MG/20ML Inject 1,200  mg into the vein every 21 ( twenty-one) days. 05/06/19  Yes [provider]  atorvastatin (LIPITOR) 20 MG tablet Take 1 tablet (20 mg total) by mouth daily. 01/03/17  Yes Hagler, Apolonio Schneiders, MD  CARBOPLATIN IV Inject into the vein every 21 ( twenty-one) days. X 4 cycles 05/06/19  Yes [provider]  Carboxymethylcellul-Glycerin (LUBRICATING EYE DROPS OP) Place 1 drop into the left eye daily as needed (dry eyes).   Yes [provider]  diazepam (VALIUM) 5 MG tablet Take 5 mg by mouth once. For MRI 04/24/19  Yes [provider]  diltiazem (DILACOR XR) 240 MG 24 hr capsule Take 240 mg by mouth daily.   Yes [provider]  dofetilide (TIKOSYN) 250 MCG capsule Take 1 capsule (250 mcg total) by mouth 2 (two) times daily. 09/15/18  Yes Satira Sark, MD  ETOPOSIDE IV Inject into the vein every 21 ( twenty-one) days. Days 1-3 every 21 days x 4 cycles 05/06/19  Yes [provider]  Fluticasone-Umeclidin-Vilant (TRELEGY ELLIPTA) 100-62.5-25 MCG/INH AEPB Inhale 1 puff into the lungs daily.   Yes [provider]  furosemide (LASIX) 40 MG tablet TAKE 1 TABLET BY MOUTH EVERY DAY Patient taking differently: Take 40 mg by mouth daily.  06/07/17  Yes Hagler, Apolonio Schneiders, MD  gabapentin (NEURONTIN) 300 MG capsule Take 600 mg by mouth 2 (two) times daily.    Yes [provider]  guaifenesin (ROBITUSSIN) 100 MG/5ML syrup Take 200 mg by mouth at bedtime as needed for cough.   Yes  [provider]  hydrOXYzine (VISTARIL) 25 MG capsule Take 25 mg by mouth every 8 (eight) hours.   Yes [provider]  insulin lispro (HUMALOG) 100 UNIT/ML injection Inject 2-20 Units into the skin 3 (three) times daily with meals. Sliding scale    Yes [provider]  ipratropium-albuterol (DUONEB) 0.5-2.5 (3) MG/3ML SOLN Take 3 mLs by nebulization every 4 (four) hours as needed (shortness of breath).    Yes [provider]  Lancet Devices (ADJUSTABLE LANCING DEVICE) MISC TO check blood glucose daily 01/06/19  Yes [provider]  LANTUS SOLOSTAR 100 UNIT/ML Solostar Pen INJECT 20 UNITS UNDER THE SKIN EVERY MORNING AND INJECT 20 UNITS UNDER THE SKIN EVERY EVENING Patient taking differently: Inject 20 Units into the skin 2 (two) times daily.  05/02/17  Yes Hagler, Apolonio Schneiders, MD  levalbuterol Penne Lash) 0.63 MG/3ML nebulizer solution Inhale 3 mLs (0.63 mg total) into the lungs 4 (four) times daily. 01/03/17  Yes Hagler, Apolonio Schneiders, MD  Lidocaine (SALONPAS PAIN RELIEVING EX) Apply 1 patch topically daily.    Yes [provider]  lidocaine-prilocaine (EMLA) cream Apply a small amount to port a cath site and cover with plastic wrap 1 hour prior to chemotherapy appointments 05/06/19  Yes Derek Jack, MD  metFORMIN (GLUCOPHAGE) 500 MG tablet Take 500 mg by mouth in the morning and at bedtime.   Yes [provider]  metoprolol succinate (TOPROL-XL) 100 MG 24 hr tablet Take 100 mg by mouth daily. 02/13/19  Yes [provider]  Multiple Vitamin (MULTIVITAMIN WITH MINERALS) TABS tablet Take 1 tablet by mouth daily.   Yes [provider]  potassium chloride (KLOR-CON) 10 MEQ tablet Take 10 mEq by mouth 2 (two) times daily. 02/13/19  Yes [provider]  pregabalin (LYRICA) 150 MG capsule Take 150 mg by mouth in the morning and at bedtime.   Yes [provider]  PROAIR HFA 108 [phone] Base) MCG/ACT inhaler INHALE TWO  PUFFS BY MOUTH EVERY 4 HOURS AS NEEDED FOR WHEEZING Patient taking differently: Inhale 2 puffs into the lungs every 4 (four) hours as needed (wheezing/shortness of breath.).  06/11/17  Yes [name], MD  prochlorperazine (COMPAZINE) 10 MG tablet Take 1 tablet (10 mg total) by mouth every 6 (six) hours as needed (Nausea or vomiting). 05/06/19  Yes Derek Jack, MD  senna (SENOKOT) 8.6 MG tablet Take 1 tablet by mouth in the morning and at bedtime.    Yes [provider]  spironolactone (ALDACTONE) 25 MG tablet Take 12.5 mg by mouth 2 (two) times daily.  03/11/19  Yes [provider]  tamsulosin (FLOMAX) 0.4 MG CAPS capsule Take 1 capsule (0.4 mg total) by mouth 2 (two) times daily after a meal. 01/03/17  Yes Hagler, Apolonio Schneiders, MD  tiotropium (SPIRIVA HANDIHALER) 18 MCG inhalation capsule Place 1 capsule (18 mcg total) into inhaler and inhale daily. Patient taking differently: Place 18 mcg into inhaler and inhale daily as needed (respiratory issues.).  03/06/17  Yes [name], MD  traZODone (DESYREL) 50 MG tablet Take 50 mg by mouth at bedtime. 04/04/17  Yes [provider]  fluticasone (FLONASE) 50 MCG/ACT nasal spray Place 2 sprays into both nostrils daily. Patient taking differently: Place 2 sprays into both nostrils daily as needed (allergies.).  03/06/17   [name], MD  HYDROcodone-acetaminophen (NORCO/VICODIN) 5-325 MG tablet Take 1 tablet by mouth every 4 (four) hours as needed. 01/18/19   Isla Pence, MD     Allergies:    No Known Allergies   Physical Exam:   Vitals  Blood pressure 117/65, pulse 93, temperature 99.3 F (37.4 C), temperature source Oral, resp. rate 18, height 6\' 1"  (1.854 m), weight 114 kg, SpO2 94 %.  Physical Examination: General appearance - alert, chronically ill appearing, and in no distress Mental status - alert, oriented to person, place, and time,  Eyes - sclera anicteric Neck - supple, no JVD elevation , Chest  -diminished breath sounds with scattered rhonchi especially on the left  heart - S1 and S2 normal, regular , left chest Port-A-Cath in situ Abdomen - soft, nontender, nondistended, no masses or organomegaly Neurological - screening mental status exam normal, neck supple without rigidity, cranial nerves II through XII intact, DTR's normal and symmetric Extremities -1+ pedal edema noted, intact peripheral pulses  Skin - warm, dry     Data Review:    CBC Recent Labs  Lab 05/11/19 1119 05/15/19 1255  WBC 5.9 0.7*  HGB 13.7 12.8*  HCT 42.2 40.6  PLT 119* 60*  MCV 97.0 97.1  MCH 31.5 30.6  MCHC 32.5 31.5  RDW 13.7 13.6  LYMPHSABS 0.4* 0.3*  MONOABS 0.1 0.2  EOSABS 0.0 0.0  BASOSABS 0.0 0.0   ------------------------------------------------------------------------------------------------------------------  Chemistries  Recent Labs  Lab 05/11/19 1119 05/15/19 1255  NA 130* 134*  K 4.3 3.9  CL 94* 98  CO2 25 27  GLUCOSE 358* 186*  BUN 33* 19  CREATININE 1.25* 0.82  CALCIUM 8.6* 8.8*  AST 31  --   ALT 53*  --   ALKPHOS 52  --   BILITOT 0.7  --    ------------------------------------------------------------------------------------------------------------------ estimated creatinine clearance is 117.2 mL/min (by C-G formula based on SCr of 0.82 mg/dL). ------------------------------------------------------------------------------------------------------------------ No results for input(s): TSH, T4TOTAL, T3FREE, THYROIDAB in the last 72 hours.  Invalid input(s): FREET3   Coagulation profile No results for input(s): INR, PROTIME in the last 168 hours. ------------------------------------------------------------------------------------------------------------------- No results for input(s): DDIMER in  the last 72 hours. -------------------------------------------------------------------------------------------------------------------  Cardiac Enzymes No results for  input(s): CKMB, TROPONINI, MYOGLOBIN in the last 168 hours.  Invalid input(s): CK ------------------------------------------------------------------------------------------------------------------    Component Value Date/Time   BNP 48.0 05/15/2019 1255     ---------------------------------------------------------------------------------------------------------------  Urinalysis    Component Value Date/Time   COLORURINE YELLOW 01/29/2014 1808   APPEARANCEUR CLEAR 01/29/2014 1808   LABSPEC 1.020 01/29/2014 1808   PHURINE 6.5 01/29/2014 1808   GLUCOSEU 250 (A) 01/29/2014 1808   HGBUR NEGATIVE 01/29/2014 1808   BILIRUBINUR NEGATIVE 01/29/2014 1808   KETONESUR TRACE (A) 01/29/2014 1808   PROTEINUR NEGATIVE 01/29/2014 1808   UROBILINOGEN 0.2 01/29/2014 1808   NITRITE NEGATIVE 01/29/2014 1808   LEUKOCYTESUR NEGATIVE 01/29/2014 1808    ----------------------------------------------------------------------------------------------------------------   Imaging Results:    DG Chest Port 1 View  Result Date: 05/15/2019 CLINICAL DATA:  Shortness of breath and weakness. History of diabetes and lung cancer. EXAM: PORTABLE CHEST 1 VIEW COMPARISON:  May 14, 2019 FINDINGS: The patient has a left Port-A-Cath. No pneumothorax. The heart, hila, and mediastinum are normal. No pulmonary nodules or masses identified. There is opacity in left lung base which is somewhat platelike in appearance. No other acute abnormalities are noted. IMPRESSION: 1. Platelike opacity in left base may represent atelectasis or developing infiltrate. Recommend clinical correlation and attention on follow-up. No other acute abnormalities are identified. Electronically Signed   By: Dorise Bullion III M.D   On: 05/15/2019 13:33    Radiological Exams on Admission: DG Chest Port 1 View  Result Date: 05/15/2019 CLINICAL DATA:  Shortness of breath and weakness. History of diabetes and lung cancer. EXAM: PORTABLE CHEST 1 VIEW  COMPARISON:  May 14, 2019 FINDINGS: The patient has a left Port-A-Cath. No pneumothorax. The heart, hila, and mediastinum are normal. No pulmonary nodules or masses identified. There is opacity in left lung base which is somewhat platelike in appearance. No other acute abnormalities are noted. IMPRESSION: 1. Platelike opacity in left base may represent atelectasis or developing infiltrate. Recommend clinical correlation and attention on follow-up. No other acute abnormalities are identified. Electronically Signed   By: Dorise Bullion III M.D   On: 05/15/2019 13:33    DVT Prophylaxis -SCD  --low platelets AM Labs Ordered, also please review Full Orders  Family Communication: Admission, patients condition and plan of care including tests being ordered have been discussed with the patient and indicate understanding and agree with the plan   Code Status - Full Code  Likely DC to  home if cultures are negative and neutropenia improves  Condition   stable Roxan Hockey M.D on 05/15/2019 at 7:42 PM Go to [email or web address] -  for contact info  [facility] - Office  [phone]

## 2019-05-16 DIAGNOSIS — I10 Essential (primary) hypertension: Secondary | ICD-10-CM

## 2019-05-16 LAB — CBC
HCT: 36.6 % — ABNORMAL LOW (ref 39.0–52.0)
Hemoglobin: 11.8 g/dL — ABNORMAL LOW (ref 13.0–17.0)
MCH: 31.1 pg (ref 26.0–34.0)
MCHC: 32.2 g/dL (ref 30.0–36.0)
MCV: 96.3 fL (ref 80.0–100.0)
Platelets: 47 10*3/uL — ABNORMAL LOW (ref 150–400)
RBC: 3.8 MIL/uL — ABNORMAL LOW (ref 4.22–5.81)
RDW: 13.6 % (ref 11.5–15.5)
WBC: 0.8 10*3/uL — CL (ref 4.0–10.5)
nRBC: 0 % (ref 0.0–0.2)

## 2019-05-16 LAB — GLUCOSE, CAPILLARY
Glucose-Capillary: 181 mg/dL — ABNORMAL HIGH (ref 70–99)
Glucose-Capillary: 175 mg/dL — ABNORMAL HIGH (ref 70–99)
Glucose-Capillary: 186 mg/dL — ABNORMAL HIGH (ref 70–99)
Glucose-Capillary: 262 mg/dL — ABNORMAL HIGH (ref 70–99)

## 2019-05-16 MED ORDER — GUAIFENESIN-DM 100-10 MG/5ML PO SYRP
5.0000 mL | ORAL_SOLUTION | ORAL | Status: DC | PRN
  Administered 2019-05-16 – 2019-05-18 (×10): 5 mL via ORAL
  Filled 2019-05-16 (×11): qty 5

## 2019-05-16 MED ORDER — SPIRONOLACTONE 25 MG PO TABS
12.5000 mg | ORAL_TABLET | Freq: Two times a day (BID) | ORAL | Status: DC
  Administered 2019-05-16 – 2019-05-18 (×5): 12.5 mg via ORAL
  Filled 2019-05-16: qty 1
  Filled 2019-05-16 (×7): qty 0.5
  Filled 2019-05-16: qty 1
  Filled 2019-05-16 (×2): qty 0.5
  Filled 2019-05-16 (×3): qty 1

## 2019-05-16 NOTE — Progress Notes (Signed)
CRITICAL VALUE ALERT  Critical Value:  WBC 0.8  Date & Time Notied:  05/16/19/ 0635  Provider Notified: Hospitalist  Orders Received/Actions taken: No new orders at this time

## 2019-05-16 NOTE — Progress Notes (Signed)
At approximately 11:35 I arrived , per patient's request, to administer a PRN Duoneb treatment . The patient  was sleeping soundly and appeared in no respiratory distress. RT will round on patient later.

## 2019-05-16 NOTE — Progress Notes (Signed)
Patients Breo Ellipta and Incruse Ellipta have been placed in room under needle box by sink.

## 2019-05-16 NOTE — Progress Notes (Addendum)
Patient took about 1/2 of treatment then said it was through and pulled off Neb. This is after he called for neb. He basically asked question and then 2 minutes later repeats question or turns on call bell. He has turned on call bell 3 times while  receiving treatment while therapist is in room. He is compulsive, has no idea of time. One idea drops in head he does it.  He has no memory at times.  It is doubtful if he can take care of himself at home.

## 2019-05-16 NOTE — Progress Notes (Signed)
Patient had split sleep study, no CPAP was started by sleep lab. Patient on 2 liters oxygen at home.

## 2019-05-16 NOTE — TOC Initial Note (Signed)
Transition of Care [facility]) - Initial/Assessment Note   Patient Details  Name: Chase Herrera MRN: [id number] Date of Birth: 1950/09/06  Transition of [facility]) CM/SW Contact:    Sherie Don, LCSW Phone Number: 05/16/2019, 1:54 PM  Clinical Narrative: Patient is a 69 year old male who was admitted to the hospital for increased weakness and leukopenia. TOC received consult for HH/DME needs. CSW called patient to complete initial assessment. Per patient, he lives alone in a single family home. He does not currently have any DME in the home, but it trying to pick up a transport chair and rolling walker from [place] once an issue with his insurance is addressed. Patient has had [facility] in the past for [facility] and patient reported he thinks he will need HH to discharge home.  Expected Discharge Plan: [place] Barriers to Discharge: Continued Medical Work up  Patient Goals and CMS Choice Patient states their goals for this hospitalization and ongoing recovery are:: Discharge home with [facility] CMS Medicare.gov Compare Post Acute Care list provided to:: Patient Choice offered to / list presented to : Patient  Expected Discharge Plan and Services Expected Discharge Plan: [place] Choice: [place] arrangements for the past 2 months: Single Family Home   Prior Living Arrangements/Services Living arrangements for the past 2 months: Single Family Home Lives with:: Self Patient language and need for interpreter reviewed:: Yes Do you feel safe going back to the place where you live?: Yes      Need for Family Participation in Patient Care: No (Comment) Care giver support system in place?: Yes (comment)(Kathy Constance Haw (sister) PH: [phone])   Criminal Activity/Legal Involvement Pertinent to Current Situation/Hospitalization: No - Comment as needed  Activities of Daily Living Home Assistive Devices/Equipment: Cane (specify quad or  straight) ADL Screening (condition at time of admission) Patient's cognitive ability adequate to safely complete daily activities?: Yes Is the patient deaf or have difficulty hearing?: No Does the patient have difficulty seeing, even when wearing glasses/contacts?: No Does the patient have difficulty concentrating, remembering, or making decisions?: No Patient able to express need for assistance with ADLs?: Yes Does the patient have difficulty dressing or bathing?: No Independently performs ADLs?: Yes (appropriate for developmental age) Does the patient have difficulty walking or climbing stairs?: Yes Weakness of Legs: Both Weakness of Arms/Hands: None  Emotional Assessment Attitude/Demeanor/Rapport: Engaged Affect (typically observed): Accepting Orientation: : Oriented to Self, Oriented to Place, Oriented to  Time, Oriented to Situation Alcohol / Substance Use: Not Applicable Psych Involvement: No (comment)  Admission diagnosis:  Leukopenia [D72.819] SOB (shortness of breath) [R06.02] Patient Active Problem List   Diagnosis Date Noted  . Leukopenia 05/15/2019  . Lt sided Pneumonia 05/15/2019  . Goals of care, counseling/discussion 04/24/2019  . Small cell carcinoma of lung, right ([place])   . Depression 04/07/2019  . Acute pulmonary embolism ([place]) 02/01/2019  . Lung abscess ([place]) 02/01/2019  . CHF (congestive heart failure) ([place]) 03/15/2018  . Chronic diastolic CHF (congestive heart failure) ([place]) 10/28/2017  . H/O adenomatous polyp of colon 04/29/2017  . Port-A-Cath in place   . Malignant neoplasm of lung ([place])   . Aortic atherosclerosis ([place]) 04/23/2017  . Hepatic steatosis 04/23/2017  . Type 2 diabetes mellitus ([place])   . Essential hypertension   . Atrial fibrillation ([place]) 11/29/2016  . Diabetes mellitus type 2 in obese ([place]) 11/29/2016  . Healthcare-associated pneumonia 11/29/2016  . Small cell lung cancer (  [place]) 04/03/2016  . COPD (chronic obstructive pulmonary disease)  ([place]) 02/08/2012  . Atrial flutter ([place]) 01/06/2012  . Nonischemic cardiomyopathy ([place]) 10/15/2011  . Tobacco abuse 10/15/2011   PCP:  Gwenlyn Saran, [place] [facility]:   [place], [place] [address] [place] [address] Phone: [phone] Fax: [phone]  Readmission Risk Interventions No flowsheet data found.

## 2019-05-16 NOTE — Progress Notes (Signed)
Patient Demographics:    Chase Herrera, is a 69 y.o. male, DOB - 08/17/1950, VZC:[id number]  Admit date - 05/15/2019   Admitting Physician Naszir Cott Denton Brick, MD  Outpatient Primary MD for the patient is [facility]  LOS - 1   Chief Complaint  Patient presents with  . Extremity Weakness        Subjective:    Chase Herrera today has no fevers, no emesis,  No chest pain, complains of fatigue and malaise -Some productive cough  Assessment  & Plan :    Principal Problem:   Leukopenia Active Problems:   Small cell lung cancer (HCC)   Lt sided Pneumonia   Atrial fibrillation (HCC)   Nonischemic cardiomyopathy (HCC)   Diabetes mellitus type 2 in obese [facility])   Essential hypertension   Port-A-Cath in place   Chronic diastolic CHF (congestive heart failure) (HCC)  Brief Summary:- 69 y.o. male with past medical history relevant for recurrent right-sided small cell cancer of the lung, chronic atrial fibrillation, DM, HTN, obesity, COPD and a reformed smoker who presents to the ED after being initially seen at [facility] in ED with complaints of progressive weakness and fatigue since getting chemo on 05/06/2019, admitted on 05/15/2019 with pneumonia and neutropenia   A/p 1) left-sided pneumonia in an immunocompromised patient with chemo induced neutropenia--- continue IV cefepime started on 05/15/2019 pending culture results - MRSA PCR is negative -Blood cultures pending -Bronchodilators as ordered -Hypoxia noted, continue oxygen at 3 L/min  2) chemo induced neutropenia/leukopenia--- WBC is up to 0.8 from 0.7, absolute neutrophil 0.2--- last dose of chemo 05/06/2019, patient received Neulasta/Udenyca on 05/11/2019 --- Continue to monitor CBC -Discussed with Dr. Delton Coombes no need for Neupogen at this time -Reverse barrier nursing and neutropenic  precautions  3)Recurrent right-sided-small cell lung cancer initially treated in 2018--- suspicion of brain mets -Chemotherapy as above #2 - 4)Chronic atrial fibrillation--- hold Eliquis due to thrombocytopenia with platelets down to 47K from 60 K on admission--- -continue Cardizem and metoprolol for rate control, Tikosyn for rhythm control  5)DM2-A1c is 10.4 reflecting uncontrolled DM PTA,  -Continue Lantus insulin 20 units twice daily,  --Use Novolog/Humalog Sliding scale insulin with Accu-Cheks/Fingersticks as ordered   6)COPD--- no acute exacerbation at this time, continue bronchodilators  7) acute on chronic hypoxic respiratory failure--- PTA was on 2 L of oxygen at home currently requiring 3 L as above #1  Disposition/Need for in-Hospital Stay- patient unable to be discharged at this time due to ---pneumonia in an immunocompromised patient with neutropenia requiring IV antibiotics pending culture results -Patient From: home  D/C Place: Home with home health services Barriers: Not Clinically Stable-needs IV antibiotics  Code Status : full  Family Communication:   NA (patient is alert, awake and coherent)  Consults  :  na  DVT Prophylaxis  :    - SCDs /low platelets  Lab Results  Component Value Date   PLT 47 (L) 05/16/2019    Inpatient Medications  Scheduled Meds: . atorvastatin  20 mg Oral Daily  . diltiazem  240 mg Oral Daily  . dofetilide  250 mcg Oral BID  . fluticasone furoate-vilanterol  1 puff Inhalation Daily   And  . umeclidinium bromide  1  puff Inhalation Daily  . gabapentin  600 mg Oral BID  . hydrOXYzine  25 mg Oral TID  . insulin aspart  0-15 Units Subcutaneous TID WC  . insulin aspart  0-5 Units Subcutaneous QHS  . insulin glargine  20 Units Subcutaneous BID  . metoprolol succinate  100 mg Oral Daily  . multivitamin with minerals  1 tablet Oral Daily  . pregabalin  150 mg Oral Daily  . senna-docusate  2 tablet Oral BID  . sodium chloride flush   3 mL Intravenous Q12H  . spironolactone  12.5 mg Oral BID  . tamsulosin  0.4 mg Oral BID PC  . traZODone  50 mg Oral QHS   Continuous Infusions: . sodium chloride 250 mL (05/16/19 0620)  . ceFEPime (MAXIPIME) IV 2 g (05/16/19 1340)   PRN Meds:.sodium chloride, albuterol, ALPRAZolam, antiseptic oral rinse, guaifenesin, guaiFENesin-dextromethorphan, HYDROcodone-acetaminophen, ipratropium-albuterol, ondansetron **OR** ondansetron (ZOFRAN) IV, polyethylene glycol, polyvinyl alcohol, prochlorperazine, sodium chloride flush   Anti-infectives (From admission, onward)   Start     Dose/Rate Route Frequency Ordered Stop   05/15/19 1415  ceFEPIme (MAXIPIME) 2 g in sodium chloride 0.9 % 100 mL IVPB     2 g 200 mL/hr over 30 Minutes Intravenous Every 8 hours 05/15/19 1410          Objective:   Vitals:   05/16/19 0716 05/16/19 1000 05/16/19 1340 05/16/19 1528  BP:  (!) 110/57 123/84   Pulse:  68 70   Resp:  20 20   Temp:  98.3 F (36.8 C) 98.3 F (36.8 C)   TempSrc:  Oral Oral   SpO2: 93% 95% 93% 93%  Weight:      Height:        Wt Readings from Last 3 Encounters:  05/15/19 114 kg  05/06/19 114.9 kg  04/21/19 118.4 kg     Intake/Output Summary (Last 24 hours) at 05/16/2019 1531 Last data filed at 05/16/2019 0400 Gross per 24 hour  Intake 102.32 ml  Output --  Net 102.32 ml    Physical Exam  Gen:- Awake Alert, in no acute distress, obese HEENT:- Ward.AT, No sclera icterus Neck-Supple Neck,No JVD,.  Lungs-diminished breath sounds, few scattered rhonchi on the left, no wheezing CV- S1, S2 normal, regular , Port-A-Cath in situ left subclavian area Abd-  +ve B.Sounds, Abd Soft, No tenderness, increased truncal adiposity    Extremity/Skin:-Trace edema, pedal pulses present  Psych-affect is appropriate, oriented x3 Neuro-generalized weakness no new focal deficits, no tremors   Data Review:   Micro Results Recent Results (from the past 240 hour(s))  Culture, blood (x 2)      Status: None (Preliminary result)   Collection Time: 05/15/19  2:55 PM   Specimen: BLOOD LEFT HAND  Result Value Ref Range Status   Specimen Description BLOOD LEFT HAND  Final   Special Requests   Final    BOTTLES DRAWN AEROBIC AND ANAEROBIC Blood Culture adequate volume   Culture   Final    NO GROWTH < 24 HOURS Performed at [facility], [address] [place], [place] [address]    Report Status PENDING  Incomplete  Culture, blood (x 2)     Status: None (Preliminary result)   Collection Time: 05/15/19  2:55 PM   Specimen: BLOOD LEFT WRIST  Result Value Ref Range Status   Specimen Description BLOOD LEFT WRIST  Final   Special Requests   Final    BOTTLES DRAWN AEROBIC AND ANAEROBIC Blood Culture adequate volume  Culture   Final    NO GROWTH < 24 HOURS Performed at [facility], [address] [place], [place] [address]    Report Status PENDING  Incomplete  MRSA PCR Screening     Status: None   Collection Time: 05/15/19  3:29 PM   Specimen: Nasal Mucosa; Nasopharyngeal  Result Value Ref Range Status   MRSA by PCR NEGATIVE NEGATIVE Final    Comment:        The GeneXpert MRSA Assay (FDA approved for NASAL specimens only), is one component of a comprehensive MRSA colonization surveillance program. It is not intended to diagnose MRSA infection nor to guide or monitor treatment for MRSA infections. Performed at [facility], [address] [place], [place] [address]   Respiratory Panel by RT PCR (Flu A&B, Covid) - Nasopharyngeal Swab     Status: None   Collection Time: 05/15/19  3:45 PM   Specimen: Nasopharyngeal Swab  Result Value Ref Range Status   SARS Coronavirus 2 by RT PCR NEGATIVE NEGATIVE Final    Comment: (NOTE) SARS-CoV-2 target nucleic acids are NOT DETECTED. The SARS-CoV-2 RNA is generally detectable in upper respiratoy specimens during the acute phase of infection. The lowest concentration of SARS-CoV-2 viral copies this assay can detect is 131 copies/mL. A  negative result does not preclude SARS-Cov-2 infection and should not be used as the sole basis for treatment or other patient management decisions. A negative result may occur with  improper specimen collection/handling, submission of specimen other than nasopharyngeal swab, presence of viral mutation(s) within the areas targeted by this assay, and inadequate number of viral copies (<131 copies/mL). A negative result must be combined with clinical observations, patient history, and epidemiological information. The expected result is Negative. Fact Sheet for Patients:  PinkCheek.be Fact Sheet for Healthcare Providers:  GravelBags.it This test is not yet ap proved or cleared by the [place] FDA and  has been authorized for detection and/or diagnosis of SARS-CoV-2 by FDA under an Emergency Use Authorization (EUA). This EUA will remain  in effect (meaning this test can be used) for the duration of the COVID-19 declaration under Section 564(b)(1) of the Act, 21 U.S.C. section 360bbb-3(b)(1), unless the authorization is terminated or revoked sooner.    Influenza A by PCR NEGATIVE NEGATIVE Final   Influenza B by PCR NEGATIVE NEGATIVE Final    Comment: (NOTE) The Xpert Xpress SARS-CoV-2/FLU/RSV assay is intended as an aid in  the diagnosis of influenza from Nasopharyngeal swab specimens and  should not be used as a sole basis for treatment. Nasal washings and  aspirates are unacceptable for Xpert Xpress SARS-CoV-2/FLU/RSV  testing. Fact Sheet for Patients: PinkCheek.be Fact Sheet for Healthcare Providers: GravelBags.it This test is not yet approved or cleared by the [place] FDA and  has been authorized for detection and/or diagnosis of SARS-CoV-2 by  FDA under an Emergency Use Authorization (EUA). This EUA will remain  in effect (meaning this test can be used) for  the duration of the  Covid-19 declaration under Section 564(b)(1) of the Act, 21  U.S.C. section 360bbb-3(b)(1), unless the authorization is  terminated or revoked. Performed at [facility], [address], [place], [place] [address]     Radiology Reports CT Head W Wo Contrast  Result Date: 04/17/2019 CLINICAL DATA:  Recurrent small cell lung cancer. EXAM: CT HEAD WITHOUT AND WITH CONTRAST TECHNIQUE: Contiguous axial images were obtained from the base of the skull through the vertex without and with intravenous contrast CONTRAST:  128mL OMNIPAQUE IOHEXOL 300 MG/ML  SOLN COMPARISON:  MRI of the brain April 09, 2016. FINDINGS: Brain: No evidence of acute infarction, hemorrhage, hydrocephalus, extra-axial collection or mass lesion/mass effect. Prominent confluent hypodensity of the white matter of the cerebral hemispheres, nonspecific. Ill-defined area of hypodensity within the left side of the pons with incomplete hyperdense contour without evidence of contrast enhancement. Correlation with contrast enhanced MRI of the brain suggested. No focus of abnormal contrast enhancement to suggest metastatic disease to the brain. Vascular: No hyperdense vessel or unexpected calcification. Visible vessels are patent. Skull: Normal. Negative for fracture or focal lesion. Sinuses/Orbits: No acute finding. IMPRESSION: 1. Ill-defined area of hypodensity within the left side of the pons with incomplete hyperdense contour, nonspecific. Correlation with contrast enhanced MRI of the brain suggested. 2. No definitive evidence of metastatic disease to the brain. 3. Extensive confluent hypodensity of the white matter of the cerebral hemispheres, nonspecific but most likely related to chronic small vessel ischemia. These results will be called to the ordering clinician or representative by the Radiologist Assistant, and communication documented in the PACS or [employer]. Electronically Signed   By: Pedro Earls M.D.   On: 04/17/2019 13:00   CT Chest W Contrast  Result Date: 04/17/2019 CLINICAL DATA:  Recurrent small-cell lung cancer.  Restaging. EXAM: CT CHEST, ABDOMEN, AND PELVIS WITH CONTRAST TECHNIQUE: Multidetector CT imaging of the chest, abdomen and pelvis was performed following the standard protocol during bolus administration of intravenous contrast. CONTRAST:  164mL OMNIPAQUE IOHEXOL 300 MG/ML  SOLN COMPARISON:  Chest CT 03/11/2019. PET-CT 01/26/2019. Abdomen pelvis CT 03/25/2017. FINDINGS: CT CHEST FINDINGS Cardiovascular: The heart size is normal. No substantial pericardial effusion. Coronary artery calcification is evident. Atherosclerotic calcification is noted in the wall of the thoracic aorta. Mediastinum/Nodes: No mediastinal lymphadenopathy. There is no hilar lymphadenopathy. The esophagus has normal imaging features. There is no axillary lymphadenopathy. Lungs/Pleura: Centrilobular and paraseptal emphysema evident. Right suprahilar paramediastinal and apical scarring is stable. Nodular component in the right apex measures 2.7 x 1.2 cm today compared to 2.6 x 1.2 cm previously. Posterolateral right upper lobe subpleural nodule has progressed in the interval measuring 2.3 x 1.4 cm today (40/5) compared to 1.6 x 1.0 cm previously. Subpleural nodule right lower lobe (73/5) measures 1.7 x 1.2 cm today compared to 1.5 x 0.8 cm previously. 12 mm ground-glass nodule identified right middle lobe on 89/5. The focus of consolidative airspace opacity in the posterior left lower lobe measured previously at 2.4 cm has decreased in the interval measuring 1.7 cm today. There is bronchiectasis with bronchial wall thickening and small airway impaction noted inferior right middle lobe, lingula, and both lower lobes similar to prior. Interval increase in associated ill-defined micro nodularity in the right lower lobe with similar appearance of these changes in the left base. Chronic atelectasis or scarring  noted anteromedial right middle lobe, stable. Musculoskeletal: No worrisome lytic or sclerotic osseous abnormality. Old left-sided rib fractures noted. CT ABDOMEN PELVIS FINDINGS Hepatobiliary: Nodular liver contour raises the question of cirrhosis. No suspicious focal abnormality within the liver parenchyma. There is no evidence for gallstones, gallbladder wall thickening, or pericholecystic fluid. No intrahepatic or extrahepatic biliary dilation. Pancreas: No focal mass lesion. No dilatation of the main duct. No intraparenchymal cyst. No peripancreatic edema. Spleen: No splenomegaly. No focal mass lesion. Adrenals/Urinary Tract: No adrenal nodule or mass. Kidneys unremarkable. No evidence for hydroureter. The urinary bladder appears normal for the degree of distention. Stomach/Bowel: Stomach is unremarkable.  No gastric wall thickening. No evidence of outlet obstruction. Duodenum is normally positioned as is the ligament of Treitz. No small bowel wall thickening. No small bowel dilatation. The terminal ileum is normal. The appendix is not visualized, but there is no edema or inflammation in the region of the cecum. No gross colonic mass. No colonic wall thickening. Diverticular changes are noted in the left colon without evidence of diverticulitis. Vascular/Lymphatic: There is abdominal aortic atherosclerosis without aneurysm. There is no gastrohepatic or hepatoduodenal ligament lymphadenopathy. No retroperitoneal or mesenteric lymphadenopathy. No pelvic sidewall lymphadenopathy. Reproductive: The prostate gland and seminal vesicles are unremarkable. Other: No intraperitoneal free fluid. Musculoskeletal: No worrisome lytic or sclerotic osseous abnormality. IMPRESSION: 1. Continued slight progression of the 2 solid right subpleural nodules in the right upper and lower lobes. Both of these were noted to be hypermetabolic on previous PET-CT. 2. Post treatment scarring in the right apex including a 2.7 x 1.2 cm nodular  component, stable in size since prior study. This nodular component to the right apical opacity did show some hypermetabolism on the previous PET-CT. 3. Interval decrease in size of the consolidative cavitary opacity posterior medially in the left lower lobe since 03/11/2019 suggesting resolving infectious/inflammatory etiology. 4. New small 12 mm ground-glass opacity in the right middle lobe. Likely infectious/inflammatory. Attention on follow-up recommended. 5. Nodular liver contour suggests cirrhosis. 6.  Emphysema (ICD10-J43.9) and Aortic Atherosclerosis (ICD10-170.0) Electronically Signed   By: Misty Stanley M.D.   On: 04/17/2019 12:58   MR Brain W Wo Contrast  Result Date: 05/13/2019 CLINICAL DATA:  69 year old male chest with recurrent small cell lung cancer. History of prophylactic whole brain radiation. No metastatic disease to the brain identified by MRI in the past. Restaging. EXAM: MRI HEAD WITHOUT AND WITH CONTRAST TECHNIQUE: Multiplanar, multiecho pulse sequences of the brain and surrounding structures were obtained without and with intravenous contrast. CONTRAST:  69mL GADAVIST GADOBUTROL 1 MMOL/ML IV SOLN COMPARISON:  [facility] brain MRI 02/17/2018 and earlier. FINDINGS: Brain: There is a solitary round 6-7 mm focus of restricted diffusion along the lateral aspect of the anterior left occipital lobe near the confluence with the posterior temporal lobe on series 5, image 79. There is corresponding patchy T2 and FLAIR hyperintensity (series 15, image 22) and rim enhancement best seen on series 25, image 8. No regional mass effect. No associated blood products. No other abnormal diffusion identified. No other abnormal enhancement identified. No dural thickening identified. No midline shift, ventriculomegaly, extra-axial collection or acute intracranial hemorrhage. Cervicomedullary junction and pituitary are within normal limits. Chronic bilateral Patchy and confluent cerebral white  matter T2 and FLAIR hyperintensity appears stable. No cortical encephalomalacia or chronic cerebral blood products identified. Deep gray matter nuclei, brainstem and cerebellum are stable and within normal limits. Vascular: Major intracranial vascular flow voids are stable. Skull and upper cervical spine: Negative visible cervical spine and bone marrow signal. Sinuses/Orbits: Stable and negative. Other: Mild left mastoid effusion is stable. Visible internal auditory structures appear normal. Scalp and face soft tissues appear negative. IMPRESSION: 1. New small 7 mm rim enhancing lesion at the junction of the left posterior temporal and anterior occipital lobes is most compatible with a Solitary Metastasis in this setting. An atypical appearance of a lacunar infarct was also considered, but felt unlikely. 2. Elsewhere stable brain since January 2020, including signal changes compatible with prior whole brain radiation. Electronically Signed   By: Genevie Ann M.D.   On: 05/13/2019 09:17   CT Abdomen  Pelvis W Contrast  Result Date: 04/17/2019 CLINICAL DATA:  Recurrent small-cell lung cancer.  Restaging. EXAM: CT CHEST, ABDOMEN, AND PELVIS WITH CONTRAST TECHNIQUE: Multidetector CT imaging of the chest, abdomen and pelvis was performed following the standard protocol during bolus administration of intravenous contrast. CONTRAST:  123mL OMNIPAQUE IOHEXOL 300 MG/ML  SOLN COMPARISON:  Chest CT 03/11/2019. PET-CT 01/26/2019. Abdomen pelvis CT 03/25/2017. FINDINGS: CT CHEST FINDINGS Cardiovascular: The heart size is normal. No substantial pericardial effusion. Coronary artery calcification is evident. Atherosclerotic calcification is noted in the wall of the thoracic aorta. Mediastinum/Nodes: No mediastinal lymphadenopathy. There is no hilar lymphadenopathy. The esophagus has normal imaging features. There is no axillary lymphadenopathy. Lungs/Pleura: Centrilobular and paraseptal emphysema evident. Right suprahilar  paramediastinal and apical scarring is stable. Nodular component in the right apex measures 2.7 x 1.2 cm today compared to 2.6 x 1.2 cm previously. Posterolateral right upper lobe subpleural nodule has progressed in the interval measuring 2.3 x 1.4 cm today (40/5) compared to 1.6 x 1.0 cm previously. Subpleural nodule right lower lobe (73/5) measures 1.7 x 1.2 cm today compared to 1.5 x 0.8 cm previously. 12 mm ground-glass nodule identified right middle lobe on 89/5. The focus of consolidative airspace opacity in the posterior left lower lobe measured previously at 2.4 cm has decreased in the interval measuring 1.7 cm today. There is bronchiectasis with bronchial wall thickening and small airway impaction noted inferior right middle lobe, lingula, and both lower lobes similar to prior. Interval increase in associated ill-defined micro nodularity in the right lower lobe with similar appearance of these changes in the left base. Chronic atelectasis or scarring noted anteromedial right middle lobe, stable. Musculoskeletal: No worrisome lytic or sclerotic osseous abnormality. Old left-sided rib fractures noted. CT ABDOMEN PELVIS FINDINGS Hepatobiliary: Nodular liver contour raises the question of cirrhosis. No suspicious focal abnormality within the liver parenchyma. There is no evidence for gallstones, gallbladder wall thickening, or pericholecystic fluid. No intrahepatic or extrahepatic biliary dilation. Pancreas: No focal mass lesion. No dilatation of the main duct. No intraparenchymal cyst. No peripancreatic edema. Spleen: No splenomegaly. No focal mass lesion. Adrenals/Urinary Tract: No adrenal nodule or mass. Kidneys unremarkable. No evidence for hydroureter. The urinary bladder appears normal for the degree of distention. Stomach/Bowel: Stomach is unremarkable. No gastric wall thickening. No evidence of outlet obstruction. Duodenum is normally positioned as is the ligament of Treitz. No small bowel wall  thickening. No small bowel dilatation. The terminal ileum is normal. The appendix is not visualized, but there is no edema or inflammation in the region of the cecum. No gross colonic mass. No colonic wall thickening. Diverticular changes are noted in the left colon without evidence of diverticulitis. Vascular/Lymphatic: There is abdominal aortic atherosclerosis without aneurysm. There is no gastrohepatic or hepatoduodenal ligament lymphadenopathy. No retroperitoneal or mesenteric lymphadenopathy. No pelvic sidewall lymphadenopathy. Reproductive: The prostate gland and seminal vesicles are unremarkable. Other: No intraperitoneal free fluid. Musculoskeletal: No worrisome lytic or sclerotic osseous abnormality. IMPRESSION: 1. Continued slight progression of the 2 solid right subpleural nodules in the right upper and lower lobes. Both of these were noted to be hypermetabolic on previous PET-CT. 2. Post treatment scarring in the right apex including a 2.7 x 1.2 cm nodular component, stable in size since prior study. This nodular component to the right apical opacity did show some hypermetabolism on the previous PET-CT. 3. Interval decrease in size of the consolidative cavitary opacity posterior medially in the left lower lobe since 03/11/2019 suggesting resolving infectious/inflammatory etiology. 4.  New small 12 mm ground-glass opacity in the right middle lobe. Likely infectious/inflammatory. Attention on follow-up recommended. 5. Nodular liver contour suggests cirrhosis. 6.  Emphysema (ICD10-J43.9) and Aortic Atherosclerosis (ICD10-170.0) Electronically Signed   By: Misty Stanley M.D.   On: 04/17/2019 12:58   DG Chest Port 1 View  Result Date: 05/15/2019 CLINICAL DATA:  Shortness of breath and weakness. History of diabetes and lung cancer. EXAM: PORTABLE CHEST 1 VIEW COMPARISON:  May 14, 2019 FINDINGS: The patient has a left Port-A-Cath. No pneumothorax. The heart, hila, and mediastinum are normal. No pulmonary  nodules or masses identified. There is opacity in left lung base which is somewhat platelike in appearance. No other acute abnormalities are noted. IMPRESSION: 1. Platelike opacity in left base may represent atelectasis or developing infiltrate. Recommend clinical correlation and attention on follow-up. No other acute abnormalities are identified. Electronically Signed   By: Dorise Bullion III M.D   On: 05/15/2019 13:33   DG Chest Port 1 View  Result Date: 04/22/2019 CLINICAL DATA:  Port-A-Cath insertion EXAM: PORTABLE CHEST 1 VIEW COMPARISON:  03/23/2019 FINDINGS: Left chest wall port catheter tip overlies the SVC. No pneumothorax. No new consolidation. Left basilar scarring. Left pleural thickening associated with chronic rib fractures. Right nodular lesions are better seen on prior CT. Stable cardiomediastinal contours. IMPRESSION: Left chest wall port catheter tip overlies SVC.  No pneumothorax. Electronically Signed   By: Macy Mis M.D.   On: 04/22/2019 09:46   DG C-Arm 1-60 Min-No Report  Result Date: 04/22/2019 Fluoroscopy was utilized by the requesting physician.  No radiographic interpretation.     CBC Recent Labs  Lab 05/11/19 1119 05/15/19 1255 05/16/19 0436  WBC 5.9 0.7* 0.8*  HGB 13.7 12.8* 11.8*  HCT 42.2 40.6 36.6*  PLT 119* 60* 47*  MCV 97.0 97.1 96.3  MCH 31.5 30.6 31.1  MCHC 32.5 31.5 32.2  RDW 13.7 13.6 13.6  LYMPHSABS 0.4* 0.3*  --   MONOABS 0.1 0.2  --   EOSABS 0.0 0.0  --   BASOSABS 0.0 0.0  --     Chemistries  Recent Labs  Lab 05/11/19 1119 05/15/19 1255  NA 130* 134*  K 4.3 3.9  CL 94* 98  CO2 25 27  GLUCOSE 358* 186*  BUN 33* 19  CREATININE 1.25* 0.82  CALCIUM 8.6* 8.8*  AST 31  --   ALT 53*  --   ALKPHOS 52  --   BILITOT 0.7  --    ------------------------------------------------------------------------------------------------------------------ No results for input(s): CHOL, HDL, LDLCALC, TRIG, CHOLHDL, LDLDIRECT in the last 72  hours.  Lab Results  Component Value Date   HGBA1C 10.4 (H) 04/21/2019   ------------------------------------------------------------------------------------------------------------------ No results for input(s): TSH, T4TOTAL, T3FREE, THYROIDAB in the last 72 hours.  Invalid input(s): FREET3 ------------------------------------------------------------------------------------------------------------------ No results for input(s): VITAMINB12, FOLATE, FERRITIN, TIBC, IRON, RETICCTPCT in the last 72 hours.  Coagulation profile No results for input(s): INR, PROTIME in the last 168 hours.  No results for input(s): DDIMER in the last 72 hours.  Cardiac Enzymes No results for input(s): CKMB, TROPONINI, MYOGLOBIN in the last 168 hours.  Invalid input(s): CK ------------------------------------------------------------------------------------------------------------------    Component Value Date/Time   BNP 48.0 05/15/2019 1255     Genetta Fiero M.D on 05/16/2019 at 3:31 PM  Go to [email or web address] - for contact info  [facility] - Office  [phone]

## 2019-05-17 LAB — CBC WITH DIFFERENTIAL/PLATELET
Abs Immature Granulocytes: 0.2 10*3/uL — ABNORMAL HIGH (ref 0.00–0.07)
Basophils Absolute: 0 10*3/uL (ref 0.0–0.1)
Basophils Relative: 1 %
Eosinophils Absolute: 0 10*3/uL (ref 0.0–0.5)
Eosinophils Relative: 1 %
HCT: 32.2 % — ABNORMAL LOW (ref 39.0–52.0)
Hemoglobin: 10.4 g/dL — ABNORMAL LOW (ref 13.0–17.0)
Immature Granulocytes: 11 %
Lymphocytes Relative: 19 %
Lymphs Abs: 0.3 10*3/uL — ABNORMAL LOW (ref 0.7–4.0)
MCH: 31.5 pg (ref 26.0–34.0)
MCHC: 32.3 g/dL (ref 30.0–36.0)
MCV: 97.6 fL (ref 80.0–100.0)
Monocytes Absolute: 0.3 10*3/uL (ref 0.1–1.0)
Monocytes Relative: 18 %
Neutro Abs: 0.9 10*3/uL — ABNORMAL LOW (ref 1.7–7.7)
Neutrophils Relative %: 50 %
Platelets: 38 10*3/uL — ABNORMAL LOW (ref 150–400)
RBC: 3.3 MIL/uL — ABNORMAL LOW (ref 4.22–5.81)
RDW: 13.7 % (ref 11.5–15.5)
WBC: 1.8 10*3/uL — ABNORMAL LOW (ref 4.0–10.5)
nRBC: 0 % (ref 0.0–0.2)

## 2019-05-17 LAB — GLUCOSE, CAPILLARY
Glucose-Capillary: 166 mg/dL — ABNORMAL HIGH (ref 70–99)
Glucose-Capillary: 214 mg/dL — ABNORMAL HIGH (ref 70–99)
Glucose-Capillary: 167 mg/dL — ABNORMAL HIGH (ref 70–99)
Glucose-Capillary: 261 mg/dL — ABNORMAL HIGH (ref 70–99)

## 2019-05-17 LAB — URINE CULTURE: Culture: NO GROWTH

## 2019-05-17 MED ORDER — CHLORHEXIDINE GLUCONATE CLOTH 2 % EX PADS
6.0000 | MEDICATED_PAD | Freq: Every day | CUTANEOUS | Status: DC
  Administered 2019-05-17: 09:00:00 6 via TOPICAL

## 2019-05-17 MED ORDER — BENZONATATE 100 MG PO CAPS
200.0000 mg | ORAL_CAPSULE | Freq: Two times a day (BID) | ORAL | Status: DC | PRN
  Administered 2019-05-17: 200 mg via ORAL
  Filled 2019-05-17: qty 2

## 2019-05-17 NOTE — Progress Notes (Signed)
Patient Demographics:    Chase Herrera, is a 69 y.o. male, DOB - [birth year], KPT:[id number]  Admit date - 05/15/2019   Admitting Physician Jewel Mcafee Denton Brick, MD  Outpatient Primary MD for the patient is [facility]  LOS - 2   Chief Complaint  Patient presents with  . Extremity Weakness        Subjective:    Chase Herrera today has no fevers, no emesis,  No chest pain, complains of fatigue and malaise No dizziness shob better after neb rx Cough is not worse   Assessment  & Plan :    Principal Problem:   Chemoinduced Neutropenia/Leukopenia Active Problems:   Small cell lung cancer (HCC)   Lt sided Pneumonia   Atrial fibrillation (HCC)   Nonischemic cardiomyopathy (HCC)   Diabetes mellitus type 2 in obese [facility])   Essential hypertension   Port-A-Cath in place   Chronic diastolic CHF (congestive heart failure) ([place])  Brief Summary:- 69 y.o. male with past medical history relevant for recurrent right-sided small cell cancer of the lung, chronic atrial fibrillation, DM, HTN, obesity, COPD and a reformed smoker who presents to the ED after being initially seen at [facility] in ED with complaints of progressive weakness and fatigue since getting chemo on 05/06/2019, admitted on 05/15/2019 with pneumonia and neutropenia   A/p 1) left-sided pneumonia in an immunocompromised patient with chemo induced neutropenia--- continue IV cefepime started on 05/15/2019 pending culture results - MRSA PCR is negative -Blood cultures pending -Bronchodilators as ordered -Hypoxia noted, continue oxygen at 3 L/min -Possible transition to oral antibiotics on 05/18/2019 if blood cultures are still negative and neutropenia  improves further  2) chemo induced neutropenia/leukopenia--- WBC is up to 1.8 from 0.7, absolute neutrophil up to 0.9 from 0.2--- last dose of chemo 05/06/2019,  patient received Neulasta/Udenyca on 05/11/2019 --- Continue to monitor CBC -Discussed with Dr. Delton Coombes no need for Neupogen at this time -Reverse barrier nursing and neutropenic precautions  3)Recurrent right-sided-small cell lung cancer initially treated in 2018--- suspicion of brain mets -Chemotherapy as above #2 - 4)Chronic atrial fibrillation--- hold Eliquis due to thrombocytopenia with platelets down to 38K from 60 K on admission--- -continue Cardizem and metoprolol for rate control, Tikosyn for rhythm control  5)DM2-A1c is 10.4 reflecting uncontrolled DM PTA,  -Continue Lantus insulin 20 units twice daily,  --Use Novolog/Humalog Sliding scale insulin with Accu-Cheks/Fingersticks as ordered   6)COPD--- no acute exacerbation at this time, continue bronchodilators  7) acute on chronic hypoxic respiratory failure--- PTA was on 2 L of oxygen at home currently requiring 3 L as above #1  Disposition/Need for in-Hospital Stay- patient unable to be discharged at this time due to ---pneumonia in an immunocompromised patient with neutropenia requiring IV antibiotics pending culture results -Patient From: home  D/C Place: Home with home health services Barriers: Not Clinically Stable-needs IV antibiotics  -Possible discharge HOME on 05/18/2019-- -Possible transition to oral antibiotics on 05/18/2019 if blood cultures are still negative and neutropenia  improves further  Code Status : full  Family Communication:   NA (patient is alert, awake and coherent)  Consults  : Dr. Katragadda/oncology  DVT Prophylaxis  :    - SCDs /low platelets  Lab Results  Component Value  Date   PLT 38 (L) 05/17/2019    Inpatient Medications  Scheduled Meds: . atorvastatin  20 mg Oral Daily  . Chlorhexidine Gluconate Cloth  6 each Topical Daily  . diltiazem  240 mg Oral Daily  . dofetilide  250 mcg Oral BID  . fluticasone furoate-vilanterol  1 puff Inhalation Daily   And  . umeclidinium bromide   1 puff Inhalation Daily  . gabapentin  600 mg Oral BID  . hydrOXYzine  25 mg Oral TID  . insulin aspart  0-15 Units Subcutaneous TID WC  . insulin aspart  0-5 Units Subcutaneous QHS  . insulin glargine  20 Units Subcutaneous BID  . metoprolol succinate  100 mg Oral Daily  . multivitamin with minerals  1 tablet Oral Daily  . pregabalin  150 mg Oral Daily  . senna-docusate  2 tablet Oral BID  . sodium chloride flush  3 mL Intravenous Q12H  . spironolactone  12.5 mg Oral BID  . tamsulosin  0.4 mg Oral BID PC  . traZODone  50 mg Oral QHS   Continuous Infusions: . sodium chloride Stopped (05/17/19 0545)  . ceFEPime (MAXIPIME) IV 2 g (05/17/19 1458)   PRN Meds:.sodium chloride, albuterol, ALPRAZolam, antiseptic oral rinse, guaifenesin, guaiFENesin-dextromethorphan, HYDROcodone-acetaminophen, ipratropium-albuterol, ondansetron **OR** ondansetron (ZOFRAN) IV, polyethylene glycol, polyvinyl alcohol, prochlorperazine, sodium chloride flush   Anti-infectives (From admission, onward)   Start     Dose/Rate Route Frequency Ordered Stop   05/15/19 1415  ceFEPIme (MAXIPIME) 2 g in sodium chloride 0.9 % 100 mL IVPB     2 g 200 mL/hr over 30 Minutes Intravenous Every 8 hours 05/15/19 1410          Objective:   Vitals:   05/17/19 0700 05/17/19 0702 05/17/19 1311 05/17/19 1451  BP:   (!) 99/51   Pulse:   72   Resp:   18   Temp:   98 F (36.7 C)   TempSrc:   Oral   SpO2: 92% 92% 96% 93%  Weight:      Height:        Wt Readings from Last 3 Encounters:  05/15/19 114 kg  05/06/19 114.9 kg  04/21/19 118.4 kg     Intake/Output Summary (Last 24 hours) at 05/17/2019 1513 Last data filed at 05/17/2019 1300 Gross per 24 hour  Intake 1334.66 ml  Output 1650 ml  Net -315.34 ml    Physical Exam  Gen:- Awake Alert, in no acute distress, obese HEENT:- [place].AT, No sclera icterus Neck-Supple Neck,No JVD,.  Lungs-improving air movement bilaterally , no wheezing CV- S1, S2 normal, regular ,  Port-A-Cath in situ left subclavian area does not look infected Abd-  +ve B.Sounds, Abd Soft, No tenderness, increased truncal adiposity    Extremity/Skin:-Trace edema, pedal pulses present  Psych-affect is appropriate, oriented x3 Neuro-generalized weakness no new focal deficits, no tremors   Data Review:   Micro Results Recent Results (from the past 240 hour(s))  Culture, blood (x 2)     Status: None (Preliminary result)   Collection Time: 05/15/19  2:55 PM   Specimen: BLOOD LEFT HAND  Result Value Ref Range Status   Specimen Description BLOOD LEFT HAND  Final   Special Requests   Final    BOTTLES DRAWN AEROBIC AND ANAEROBIC Blood Culture adequate volume   Culture   Final    NO GROWTH 2 DAYS Performed at [facility], [address] [place], [place] [address]    Report Status PENDING  Incomplete  Culture, blood (x 2)     Status: None (Preliminary result)   Collection Time: 05/15/19  2:55 PM   Specimen: BLOOD LEFT WRIST  Result Value Ref Range Status   Specimen Description BLOOD LEFT WRIST  Final   Special Requests   Final    BOTTLES DRAWN AEROBIC AND ANAEROBIC Blood Culture adequate volume   Culture   Final    NO GROWTH 2 DAYS Performed at [facility], [address], [place], [place] [address]    Report Status PENDING  Incomplete  MRSA PCR Screening     Status: None   Collection Time: 05/15/19  3:29 PM   Specimen: Nasal Mucosa; Nasopharyngeal  Result Value Ref Range Status   MRSA by PCR NEGATIVE NEGATIVE Final    Comment:        The GeneXpert MRSA Assay (FDA approved for NASAL specimens only), is one component of a comprehensive MRSA colonization surveillance program. It is not intended to diagnose MRSA infection nor to guide or monitor treatment for MRSA infections. Performed at [facility], [address], [place], [place] [address]   Respiratory Panel by RT PCR (Flu A&B, Covid) - Nasopharyngeal Swab     Status: None   Collection Time: 05/15/19  3:45 PM    Specimen: Nasopharyngeal Swab  Result Value Ref Range Status   SARS Coronavirus 2 by RT PCR NEGATIVE NEGATIVE Final    Comment: (NOTE) SARS-CoV-2 target nucleic acids are NOT DETECTED. The SARS-CoV-2 RNA is generally detectable in upper respiratoy specimens during the acute phase of infection. The lowest concentration of SARS-CoV-2 viral copies this assay can detect is 131 copies/mL. A negative result does not preclude SARS-Cov-2 infection and should not be used as the sole basis for treatment or other patient management decisions. A negative result may occur with  improper specimen collection/handling, submission of specimen other than nasopharyngeal swab, presence of viral mutation(s) within the areas targeted by this assay, and inadequate number of viral copies (<131 copies/mL). A negative result must be combined with clinical observations, patient history, and epidemiological information. The expected result is Negative. Fact Sheet for Patients:  PinkCheek.be Fact Sheet for Healthcare Providers:  GravelBags.it This test is not yet ap proved or cleared by the [place] FDA and  has been authorized for detection and/or diagnosis of SARS-CoV-2 by FDA under an Emergency Use Authorization (EUA). This EUA will remain  in effect (meaning this test can be used) for the duration of the COVID-19 declaration under Section 564(b)(1) of the Act, 21 U.S.C. section 360bbb-3(b)(1), unless the authorization is terminated or revoked sooner.    Influenza A by PCR NEGATIVE NEGATIVE Final   Influenza B by PCR NEGATIVE NEGATIVE Final    Comment: (NOTE) The Xpert Xpress SARS-CoV-2/FLU/RSV assay is intended as an aid in  the diagnosis of influenza from Nasopharyngeal swab specimens and  should not be used as a sole basis for treatment. Nasal washings and  aspirates are unacceptable for Xpert Xpress SARS-CoV-2/FLU/RSV  testing. Fact Sheet  for Patients: PinkCheek.be Fact Sheet for Healthcare Providers: GravelBags.it This test is not yet approved or cleared by the [place] FDA and  has been authorized for detection and/or diagnosis of SARS-CoV-2 by  FDA under an Emergency Use Authorization (EUA). This EUA will remain  in effect (meaning this test can be used) for the duration of the  Covid-19 declaration under Section 564(b)(1) of the Act, 21  U.S.C. section 360bbb-3(b)(1), unless the authorization is  terminated or revoked. Performed at  [facility], [address], [place], [place] [address]   Urine culture     Status: None   Collection Time: 05/15/19 10:57 PM   Specimen: Urine, Clean Catch  Result Value Ref Range Status   Specimen Description   Final    URINE, CLEAN CATCH Performed at [facility], [address] [place], [place] [address]    Special Requests   Final    NONE Performed at [facility], [address], [place], [place] [address]    Culture   Final    NO GROWTH Performed at [facility], [address], [place], [place] [address]    Report Status 05/17/2019 FINAL  Final    Radiology Reports MR Brain W Wo Contrast  Result Date: 05/13/2019 CLINICAL DATA:  69 year old male chest with recurrent small cell lung cancer. History of prophylactic whole brain radiation. No metastatic disease to the brain identified by MRI in the past. Restaging. EXAM: MRI HEAD WITHOUT AND WITH CONTRAST TECHNIQUE: Multiplanar, multiecho pulse sequences of the brain and surrounding structures were obtained without and with intravenous contrast. CONTRAST:  6mL GADAVIST GADOBUTROL 1 MMOL/ML IV SOLN COMPARISON:  [facility] brain MRI 02/17/2018 and earlier. FINDINGS: Brain: There is a solitary round 6-7 mm focus of restricted diffusion along the lateral aspect of the anterior left occipital lobe near the confluence with the posterior temporal lobe on  series 5, image 79. There is corresponding patchy T2 and FLAIR hyperintensity (series 15, image 22) and rim enhancement best seen on series 25, image 8. No regional mass effect. No associated blood products. No other abnormal diffusion identified. No other abnormal enhancement identified. No dural thickening identified. No midline shift, ventriculomegaly, extra-axial collection or acute intracranial hemorrhage. Cervicomedullary junction and pituitary are within normal limits. Chronic bilateral Patchy and confluent cerebral white matter T2 and FLAIR hyperintensity appears stable. No cortical encephalomalacia or chronic cerebral blood products identified. Deep gray matter nuclei, brainstem and cerebellum are stable and within normal limits. Vascular: Major intracranial vascular flow voids are stable. Skull and upper cervical spine: Negative visible cervical spine and bone marrow signal. Sinuses/Orbits: Stable and negative. Other: Mild left mastoid effusion is stable. Visible internal auditory structures appear normal. Scalp and face soft tissues appear negative. IMPRESSION: 1. New small 7 mm rim enhancing lesion at the junction of the left posterior temporal and anterior occipital lobes is most compatible with a Solitary Metastasis in this setting. An atypical appearance of a lacunar infarct was also considered, but felt unlikely. 2. Elsewhere stable brain since January 2020, including signal changes compatible with prior whole brain radiation. Electronically Signed   By: Genevie Ann M.D.   On: 05/13/2019 09:17   DG Chest Port 1 View  Result Date: 05/15/2019 CLINICAL DATA:  Shortness of breath and weakness. History of diabetes and lung cancer. EXAM: PORTABLE CHEST 1 VIEW COMPARISON:  May 14, 2019 FINDINGS: The patient has a left Port-A-Cath. No pneumothorax. The heart, hila, and mediastinum are normal. No pulmonary nodules or masses identified. There is opacity in left lung base which is somewhat platelike in  appearance. No other acute abnormalities are noted. IMPRESSION: 1. Platelike opacity in left base may represent atelectasis or developing infiltrate. Recommend clinical correlation and attention on follow-up. No other acute abnormalities are identified. Electronically Signed   By: Dorise Bullion III M.D   On: 05/15/2019 13:33   DG Chest Port 1 View  Result Date: 04/22/2019 CLINICAL DATA:  Port-A-Cath insertion EXAM: PORTABLE CHEST 1  VIEW COMPARISON:  03/23/2019 FINDINGS: Left chest wall port catheter tip overlies the SVC. No pneumothorax. No new consolidation. Left basilar scarring. Left pleural thickening associated with chronic rib fractures. Right nodular lesions are better seen on prior CT. Stable cardiomediastinal contours. IMPRESSION: Left chest wall port catheter tip overlies SVC.  No pneumothorax. Electronically Signed   By: Macy Mis M.D.   On: 04/22/2019 09:46   DG C-Arm 1-60 Min-No Report  Result Date: 04/22/2019 Fluoroscopy was utilized by the requesting physician.  No radiographic interpretation.     CBC Recent Labs  Lab 05/11/19 1119 05/15/19 1255 05/16/19 0436 05/17/19 0442  WBC 5.9 0.7* 0.8* 1.8*  HGB 13.7 12.8* 11.8* 10.4*  HCT 42.2 40.6 36.6* 32.2*  PLT 119* 60* 47* 38*  MCV 97.0 97.1 96.3 97.6  MCH 31.5 30.6 31.1 31.5  MCHC 32.5 31.5 32.2 32.3  RDW 13.7 13.6 13.6 13.7  LYMPHSABS 0.4* 0.3*  --  0.3*  MONOABS 0.1 0.2  --  0.3  EOSABS 0.0 0.0  --  0.0  BASOSABS 0.0 0.0  --  0.0    Chemistries  Recent Labs  Lab 05/11/19 1119 05/15/19 1255  NA 130* 134*  K 4.3 3.9  CL 94* 98  CO2 25 27  GLUCOSE 358* 186*  BUN 33* 19  CREATININE 1.25* 0.82  CALCIUM 8.6* 8.8*  AST 31  --   ALT 53*  --   ALKPHOS 52  --   BILITOT 0.7  --    ------------------------------------------------------------------------------------------------------------------ No results for input(s): CHOL, HDL, LDLCALC, TRIG, CHOLHDL, LDLDIRECT in the last 72 hours.  Lab Results   Component Value Date   HGBA1C 10.4 (H) 04/21/2019   ------------------------------------------------------------------------------------------------------------------ No results for input(s): TSH, T4TOTAL, T3FREE, THYROIDAB in the last 72 hours.  Invalid input(s): FREET3 ------------------------------------------------------------------------------------------------------------------ No results for input(s): VITAMINB12, FOLATE, FERRITIN, TIBC, IRON, RETICCTPCT in the last 72 hours.  Coagulation profile No results for input(s): INR, PROTIME in the last 168 hours.  No results for input(s): DDIMER in the last 72 hours.  Cardiac Enzymes No results for input(s): CKMB, TROPONINI, MYOGLOBIN in the last 168 hours.  Invalid input(s): CK ------------------------------------------------------------------------------------------------------------------    Component Value Date/Time   BNP 48.0 05/15/2019 1255     Britainy Kozub M.D on 05/17/2019 at 3:13 PM  Go to [email or web address] - for contact info  [facility] - Office  [phone]

## 2019-05-18 ENCOUNTER — Inpatient Hospital Stay (HOSPITAL_COMMUNITY): Payer: Medicare Other

## 2019-05-18 DIAGNOSIS — E669 Obesity, unspecified: Secondary | ICD-10-CM

## 2019-05-18 DIAGNOSIS — E1169 Type 2 diabetes mellitus with other specified complication: Secondary | ICD-10-CM

## 2019-05-18 DIAGNOSIS — Z95828 Presence of other vascular implants and grafts: Secondary | ICD-10-CM

## 2019-05-18 LAB — CBC WITH DIFFERENTIAL/PLATELET
Abs Immature Granulocytes: 0.53 10*3/uL — ABNORMAL HIGH (ref 0.00–0.07)
Basophils Absolute: 0 10*3/uL (ref 0.0–0.1)
Basophils Relative: 1 %
Eosinophils Absolute: 0 10*3/uL (ref 0.0–0.5)
Eosinophils Relative: 1 %
HCT: 33.1 % — ABNORMAL LOW (ref 39.0–52.0)
Hemoglobin: 10.4 g/dL — ABNORMAL LOW (ref 13.0–17.0)
Immature Granulocytes: 11 %
Lymphocytes Relative: 10 %
Lymphs Abs: 0.5 10*3/uL — ABNORMAL LOW (ref 0.7–4.0)
MCH: 30.8 pg (ref 26.0–34.0)
MCHC: 31.4 g/dL (ref 30.0–36.0)
MCV: 97.9 fL (ref 80.0–100.0)
Monocytes Absolute: 0.8 10*3/uL (ref 0.1–1.0)
Monocytes Relative: 17 %
Neutro Abs: 3 10*3/uL (ref 1.7–7.7)
Neutrophils Relative %: 60 %
Platelets: 54 10*3/uL — ABNORMAL LOW (ref 150–400)
RBC: 3.38 MIL/uL — ABNORMAL LOW (ref 4.22–5.81)
RDW: 14 % (ref 11.5–15.5)
WBC: 5 10*3/uL (ref 4.0–10.5)
nRBC: 0.6 % — ABNORMAL HIGH (ref 0.0–0.2)

## 2019-05-18 LAB — BASIC METABOLIC PANEL
Anion gap: 8 (ref 5–15)
BUN: 12 mg/dL (ref 8–23)
CO2: 25 mmol/L (ref 22–32)
Calcium: 8.4 mg/dL — ABNORMAL LOW (ref 8.9–10.3)
Chloride: 104 mmol/L (ref 98–111)
Creatinine, Ser: 0.99 mg/dL (ref 0.61–1.24)
GFR calc Af Amer: >60 mL/min (ref >60)
GFR calc non Af Amer: >60 mL/min (ref >60)
Glucose, Bld: 206 mg/dL — ABNORMAL HIGH (ref 70–99)
Potassium: 4 mmol/L (ref 3.5–5.1)
Sodium: 137 mmol/L (ref 135–145)

## 2019-05-18 LAB — GLUCOSE, CAPILLARY
Glucose-Capillary: 149 mg/dL — ABNORMAL HIGH (ref 70–99)
Glucose-Capillary: 237 mg/dL — ABNORMAL HIGH (ref 70–99)
Glucose-Capillary: 240 mg/dL — ABNORMAL HIGH (ref 70–99)

## 2019-05-18 MED ORDER — BENZONATATE 200 MG PO CAPS: 200.0000 mg | ORAL_CAPSULE | Freq: Three times a day (TID) | ORAL | 0 refills | Status: DC | PRN

## 2019-05-18 MED ORDER — CEFDINIR 300 MG PO CAPS
300.0000 mg | ORAL_CAPSULE | Freq: Two times a day (BID) | ORAL | 0 refills | Status: AC
Start: 2019-05-18 — End: 2019-05-23

## 2019-05-18 MED ORDER — SENNOSIDES-DOCUSATE SODIUM 8.6-50 MG PO TABS: 2.0000 | ORAL_TABLET | Freq: Every day | ORAL | 0 refills | Status: DC

## 2019-05-18 MED ORDER — BENZONATATE 100 MG PO CAPS: 200.0000 mg | ORAL_CAPSULE | Freq: Four times a day (QID) | ORAL | Status: DC | PRN

## 2019-05-18 NOTE — Care Management Important Message (Signed)
Important Message  Patient Details  Name: Chase Herrera MRN: [id number] Date of Birth: 09-May-1950   Medicare Important Message Given:  Yes     Tommy Medal 05/18/2019, 3:30 PM

## 2019-05-18 NOTE — Discharge Summary (Signed)
Chase Herrera, is a 69 y.o. male  DOB Apr 03, 1950  MRN [id number].  Admission date:  05/15/2019  Admitting Physician  Roxan Hockey, MD  Discharge Date:  05/18/2019   Primary MD  [facility]  Recommendations for primary care physician for things to follow:    Admission Diagnosis  Leukopenia [D72.819] SOB (shortness of breath) [R06.02]   Discharge Diagnosis  Leukopenia [D72.819] SOB (shortness of breath) [R06.02]    Principal Problem:   Chemoinduced Neutropenia/Leukopenia Active Problems:   Small cell lung cancer ([place])   Lt sided Pneumonia   Atrial fibrillation (H[facility])   Nonischemic cardiomyopathy ([place])   Diabetes mellitus type 2 in obese ([place])   Essential hypertension   Port-A-Cath in place   Chronic diastolic CHF (congestive heart failure) ([place])      Past Medical History:  Diagnosis Date  . Alcohol abuse    Quit 08/23/11  . Arthritis   . Atrial fibrillation ([place])   . Atrial flutter ([place])    CTI ablation by Dr [name] 10/2011  . Bacteremia due to Gram-positive bacteria 11/29/2016  . CHF (congestive heart failure) ([place])   . COPD (chronic obstructive pulmonary disease) ([place])   . Depression   . Essential hypertension   . History of cardiomyopathy    LVEF 25-30% 08/2011 with subsequent normalization  . History of kidney stones   . Obesity   . Prostate enlargement   . Small cell lung cancer ([place])    Right upper lobe - follows with [facility]  . Type 2 diabetes mellitus ([place])     Past Surgical History:  Procedure Laterality Date  . APPENDECTOMY    . ATRIAL ABLATION SURGERY  11/06/2011   CTI ablation for atrial flutter by Dr [name]  . ATRIAL FLUTTER ABLATION N/A 11/06/2011   Procedure: ATRIAL FLUTTER ABLATION;  Surgeon: Thompson Grayer, MD;  Location: [facility] CATH LAB;  Service: Cardiovascular;  Laterality: N/A;  . Cataracts    . COLONOSCOPY WITH PROPOFOL N/A  06/06/2017   Procedure: COLONOSCOPY WITH PROPOFOL;  Surgeon: [name], MD;  Location: [facility] ENDO SUITE;  Service: Endoscopy;  Laterality: N/A;  1:15pm  . IR GENERIC HISTORICAL  04/09/2016   IR US GUIDE VASC ACCESS RIGHT 04/09/2016 [name], DO [facility]-INTERV RAD  . IR GENERIC HISTORICAL  04/09/2016   IR FLUORO GUIDE PORT INSERTION RIGHT 04/09/2016 [name], DO [facility]  . PORT-A-CATH REMOVAL N/A 04/24/2017   Procedure: MINOR REMOVAL PORT-A-CATH;  Surgeon: [name], MD;  Location: [facility] ORS;  Service: General;  Laterality: N/A;  . PORTACATH PLACEMENT Left 04/22/2019   Procedure: INSERTION PORT-A-CATH;  Surgeon: Aviva Signs, MD;  Location: [facility] ORS;  Service: General;  Laterality: Left;  . TONSILLECTOMY       HPI  from the history and physical done on the day of admission:   Chase Herrera  is a 69 y.o. male with past medical history relevant for recurrent right-sided small cell cancer of the lung, chronic atrial fibrillation, DM, HTN, obesity, COPD  and a reformed smoker who presents to the ED after being initially seen at [facility] in ED with complaints of progressive weakness and fatigue since getting chemo on 05/06/2019  --In the ED is found to have clinically and radiologically left-sided pneumonia with leukopenia and neutropenia with a white count of 0.7, and absolute neutrophil 0.2  -Discussed with patient's oncologist Dr. Delton Coombes -Recommended admission for IV antibiotics pending blood cultures -No significant hypoxia at this time patient does have some cough -Low-grade temp noted  No Nausea, Vomiting or Diarrhea    Hospital Course:     Brief Summary:- 68 y.o.malewith past medical history relevant for recurrent right-sided small cell cancer of the lung, chronic atrial fibrillation, DM, HTN, obesity, COPD and a reformed smoker who presents to the ED after being initially seen at [facility] in ED with complaints of progressive weakness  and fatigue since getting chemo on 05/06/2019, admitted on 05/15/2019 with pneumonia and neutropenia   A/p 1)left-sided pneumoniain an immunocompromised patient with chemo induced neutropenia--- treated with IV cefepime started on 05/15/2019  - MRSA PCR is negative -Blood cultures NGTD -Bronchodilators as ordered -Hypoxia is back to baseline, continue oxygen at 2 L/min -Neutropenia/leukopenia resolved, discharged home on oral Omnicef -Discussed with Dr. Delton Coombes outpatient follow-up within a week advised  2)chemo induced neutropenia/leukopenia---WBC is up to 5.0 from 0.7  ---last dose of chemo 05/06/2019,patient received Neulasta/Udenycaon 05/11/2019 -Discussed with Dr. Delton Coombes no need for Neupogen at this time   3)Recurrent right-sided-small cell lung cancer initially treated in 2018---suspicion of brain mets -Chemotherapy as above #2 - 4)Chronic atrial fibrillation---May restart Eliquis in a.m. as platelet counts are recovering -continue Cardizem and metoprolol for rate control, Tikosyn for rhythm control  5)DM2-A1c is 10.4 reflecting uncontrolled DM PTA, -Continue Lantus insulin 20 units twice daily,  6)COPD---no acute exacerbation at this time, continue bronchodilators  7) acute on chronic hypoxic respiratory failure--- PTA was on 2 L of oxygen at home , oxygen requirement is back to baseline  Disposition--- discharge home with home health PT stable, -Patient From: home  D/C Place: Home with home health services  Code Status : full  Family Communication:   NA (patient is alert, awake and coherent)  Consults  : Dr. Katragadda/oncology  DVT Prophylaxis  :    - SCDs /low platelets  Discharge Condition: stable  Follow UP  Follow-up Information    [facility] [place] Follow up.   Specialty: Home Health Services Why: Alvis Lemmings staff will call you to arrange [facility] visits Contact information: [place]  [address] [phone]        Derek Jack, MD. Schedule an appointment as soon as possible for a visit in 1 week(s).   Specialty: Hematology Contact information: [place] [address] [phone]            Consults obtained -phone consult with Dr. Delton Coombes Diet and Activity recommendation:  As advised  Discharge Instructions    Discharge Instructions    Call MD for:  difficulty breathing, headache or visual disturbances   Complete by: As directed    Call MD for:  extreme fatigue   Complete by: As directed    Call MD for:  persistant dizziness or light-headedness   Complete by: As directed    Call MD for:  persistant nausea and vomiting   Complete by: As directed    Call MD for:  severe uncontrolled pain   Complete by: As directed  Call MD for:  temperature >100.4   Complete by: As directed    Diet - low sodium heart healthy   Complete by: As directed    Diet Carb Modified   Complete by: As directed    Discharge instructions   Complete by: As directed    1) you are taking apixaban/Eliquis which is a blood thinner so Avoid ibuprofen/Advil/Aleve/Motrin/Goody Powders/Naproxen/BC powders/Meloxicam/Diclofenac/Indomethacin and other Nonsteroidal anti-inflammatory medications as these will make you more likely to bleed and can cause stomach ulcers, can also cause Kidney problems.   2) follow-up with Dr. Delton Coombes within a week for recheck and reevaluation   Increase activity slowly   Complete by: As directed         Discharge Medications     Allergies as of 05/18/2019   No Known Allergies     Medication List    STOP taking these medications   atezolizumab 1200 MG/20ML Commonly known as: TECENTRIQ   CARBOPLATIN IV   ETOPOSIDE IV   senna 8.6 MG tablet Commonly known as: SENOKOT     TAKE these medications   Accu-Chek Aviva Plus test strip Generic drug: glucose blood check blood sugar TWICE DAILY   acetaminophen 500 MG  tablet Commonly known as: TYLENOL Take 1,000 mg by mouth every 6 (six) hours as needed for moderate pain or headache.   Adjustable Lancing Device Misc TO check blood glucose daily   apixaban 5 MG Tabs tablet Commonly known as: ELIQUIS Take 5 mg by mouth 2 (two) times daily.   AquaLance Lancets 30G Misc USE TO check blood glucose twice daily   atorvastatin 20 MG tablet Commonly known as: LIPITOR Take 1 tablet (20 mg total) by mouth daily.   benzonatate 200 MG capsule Commonly known as: TESSALON Take 1 capsule (200 mg total) by mouth 3 (three) times daily as needed for cough.   Biotene Dry Mouth Moisturizing Soln Apply 1 Dose topically as needed (dry mouth).   cefdinir 300 MG capsule Commonly known as: OMNICEF Take 1 capsule (300 mg total) by mouth 2 (two) times daily for 5 days.   diazepam 5 MG tablet Commonly known as: VALIUM Take 5 mg by mouth once. For MRI   diltiazem 240 MG 24 hr capsule Commonly known as: DILACOR XR Take 240 mg by mouth daily.   dofetilide 250 MCG capsule Commonly known as: Tikosyn Take 1 capsule (250 mcg total) by mouth 2 (two) times daily.   fluticasone 50 MCG/ACT nasal spray Commonly known as: FLONASE Place 2 sprays into both nostrils daily. What changed:   when to take this  reasons to take this   furosemide 40 MG tablet Commonly known as: LASIX TAKE 1 TABLET BY MOUTH EVERY DAY   gabapentin 300 MG capsule Commonly known as: NEURONTIN Take 600 mg by mouth 2 (two) times daily.   guaifenesin 100 MG/5ML syrup Commonly known as: ROBITUSSIN Take 200 mg by mouth at bedtime as needed for cough.   HYDROcodone-acetaminophen 5-325 MG tablet Commonly known as: NORCO/VICODIN Take 1 tablet by mouth every 4 (four) hours as needed.   hydrOXYzine 25 MG capsule Commonly known as: VISTARIL Take 25 mg by mouth every 8 (eight) hours.   insulin lispro 100 UNIT/ML injection Commonly known as: HUMALOG Inject 2-20 Units into the skin 3 (three)  times daily with meals. Sliding scale   ipratropium-albuterol 0.5-2.5 (3) MG/3ML Soln Commonly known as: DUONEB Take 3 mLs by nebulization every 4 (four) hours as needed (shortness of breath).   Lantus  SoloStar 100 UNIT/ML Solostar Pen Generic drug: insulin glargine INJECT 20 UNITS UNDER THE SKIN EVERY MORNING AND INJECT 20 UNITS UNDER THE SKIN EVERY EVENING What changed: See the new instructions.   levalbuterol 0.63 MG/3ML nebulizer solution Commonly known as: XOPENEX Inhale 3 mLs (0.63 mg total) into the lungs 4 (four) times daily.   lidocaine-prilocaine cream Commonly known as: EMLA Apply a small amount to port a cath site and cover with plastic wrap 1 hour prior to chemotherapy appointments   LUBRICATING EYE DROPS OP Place 1 drop into the left eye daily as needed (dry eyes).   metFORMIN 500 MG tablet Commonly known as: GLUCOPHAGE Take 500 mg by mouth in the morning and at bedtime.   metoprolol succinate 100 MG 24 hr tablet Commonly known as: TOPROL-XL Take 100 mg by mouth daily.   multivitamin with minerals Tabs tablet Take 1 tablet by mouth daily.   potassium chloride 10 MEQ tablet Commonly known as: KLOR-CON Take 10 mEq by mouth 2 (two) times daily.   pregabalin 150 MG capsule Commonly known as: LYRICA Take 150 mg by mouth in the morning and at bedtime.   ProAir HFA 108 (90 Base) MCG/ACT inhaler Generic drug: albuterol INHALE TWO PUFFS BY MOUTH EVERY 4 HOURS AS NEEDED FOR WHEEZING What changed: See the new instructions.   prochlorperazine 10 MG tablet Commonly known as: COMPAZINE Take 1 tablet (10 mg total) by mouth every 6 (six) hours as needed (Nausea or vomiting).   SALONPAS PAIN RELIEVING EX Apply 1 patch topically daily.   senna-docusate 8.6-50 MG tablet Commonly known as: Senokot-S Take 2 tablets by mouth at bedtime.   spironolactone 25 MG tablet Commonly known as: ALDACTONE Take 12.5 mg by mouth 2 (two) times daily.   tamsulosin 0.4 MG Caps  capsule Commonly known as: FLOMAX Take 1 capsule (0.4 mg total) by mouth 2 (two) times daily after a meal.   tiotropium 18 MCG inhalation capsule Commonly known as: Spiriva HandiHaler Place 1 capsule (18 mcg total) into inhaler and inhale daily. What changed:   when to take this  reasons to take this   traZODone 50 MG tablet Commonly known as: DESYREL Take 50 mg by mouth at bedtime.   Trelegy Ellipta 100-62.5-25 MCG/INH Aepb Generic drug: Fluticasone-Umeclidin-Vilant Inhale 1 puff into the lungs daily.       Major procedures and Radiology Reports - PLEASE review detailed and final reports for all details, in brief -   DG Chest 1 View  Result Date: 05/18/2019 CLINICAL DATA:  Dyspnea and respiratory abnormalities. Changed to 1 view, pt stated he could not stand EXAM: CHEST  1 VIEW COMPARISON:  05/15/2019 and previous FINDINGS: Stable coarse bibasilar airspace opacities left greater than right. No new infiltrate or overt edema. Stable left subclavian port catheter to the proximal SVC. Heart size and mediastinal contours are within normal limits. Blunting of right lateral costophrenic angle as before. No pneumothorax. Visualized bones unremarkable. IMPRESSION: Stable bibasilar airspace opacities left greater than right. Electronically Signed   By: Lucrezia Europe M.D.   On: 05/18/2019 08:31   MR Brain W Wo Contrast  Result Date: 05/13/2019 CLINICAL DATA:  69 year old male chest with recurrent small cell lung cancer. History of prophylactic whole brain radiation. No metastatic disease to the brain identified by MRI in the past. Restaging. EXAM: MRI HEAD WITHOUT AND WITH CONTRAST TECHNIQUE: Multiplanar, multiecho pulse sequences of the brain and surrounding structures were obtained without and with intravenous contrast. CONTRAST:  57mL GADAVIST GADOBUTROL  1 MMOL/ML IV SOLN COMPARISON:  [facility] brain MRI 02/17/2018 and earlier. FINDINGS: Brain: There is a solitary round 6-7 mm  focus of restricted diffusion along the lateral aspect of the anterior left occipital lobe near the confluence with the posterior temporal lobe on series 5, image 79. There is corresponding patchy T2 and FLAIR hyperintensity (series 15, image 22) and rim enhancement best seen on series 25, image 8. No regional mass effect. No associated blood products. No other abnormal diffusion identified. No other abnormal enhancement identified. No dural thickening identified. No midline shift, ventriculomegaly, extra-axial collection or acute intracranial hemorrhage. Cervicomedullary junction and pituitary are within normal limits. Chronic bilateral Patchy and confluent cerebral white matter T2 and FLAIR hyperintensity appears stable. No cortical encephalomalacia or chronic cerebral blood products identified. Deep gray matter nuclei, brainstem and cerebellum are stable and within normal limits. Vascular: Major intracranial vascular flow voids are stable. Skull and upper cervical spine: Negative visible cervical spine and bone marrow signal. Sinuses/Orbits: Stable and negative. Other: Mild left mastoid effusion is stable. Visible internal auditory structures appear normal. Scalp and face soft tissues appear negative. IMPRESSION: 1. New small 7 mm rim enhancing lesion at the junction of the left posterior temporal and anterior occipital lobes is most compatible with a Solitary Metastasis in this setting. An atypical appearance of a lacunar infarct was also considered, but felt unlikely. 2. Elsewhere stable brain since January 2020, including signal changes compatible with prior whole brain radiation. Electronically Signed   By: Genevie Ann M.D.   On: 05/13/2019 09:17   DG Chest Port 1 View  Result Date: 05/15/2019 CLINICAL DATA:  Shortness of breath and weakness. History of diabetes and lung cancer. EXAM: PORTABLE CHEST 1 VIEW COMPARISON:  May 14, 2019 FINDINGS: The patient has a left Port-A-Cath. No pneumothorax. The heart,  hila, and mediastinum are normal. No pulmonary nodules or masses identified. There is opacity in left lung base which is somewhat platelike in appearance. No other acute abnormalities are noted. IMPRESSION: 1. Platelike opacity in left base may represent atelectasis or developing infiltrate. Recommend clinical correlation and attention on follow-up. No other acute abnormalities are identified. Electronically Signed   By: Dorise Bullion III M.D   On: 05/15/2019 13:33   DG Chest Port 1 View  Result Date: 04/22/2019 CLINICAL DATA:  Port-A-Cath insertion EXAM: PORTABLE CHEST 1 VIEW COMPARISON:  03/23/2019 FINDINGS: Left chest wall port catheter tip overlies the SVC. No pneumothorax. No new consolidation. Left basilar scarring. Left pleural thickening associated with chronic rib fractures. Right nodular lesions are better seen on prior CT. Stable cardiomediastinal contours. IMPRESSION: Left chest wall port catheter tip overlies SVC.  No pneumothorax. Electronically Signed   By: Macy Mis M.D.   On: 04/22/2019 09:46   DG C-Arm 1-60 Min-No Report  Result Date: 04/22/2019 Fluoroscopy was utilized by the requesting physician.  No radiographic interpretation.    Micro Results   Recent Results (from the past 240 hour(s))  Culture, blood (x 2)     Status: None (Preliminary result)   Collection Time: 05/15/19  2:55 PM   Specimen: BLOOD LEFT HAND  Result Value Ref Range Status   Specimen Description BLOOD LEFT HAND  Final   Special Requests   Final    BOTTLES DRAWN AEROBIC AND ANAEROBIC Blood Culture adequate volume   Culture   Final    NO GROWTH 3 DAYS Performed at [facility], [address], [place], [place] [address]    Report Status  PENDING  Incomplete  Culture, blood (x 2)     Status: None (Preliminary result)   Collection Time: 05/15/19  2:55 PM   Specimen: BLOOD LEFT WRIST  Result Value Ref Range Status   Specimen Description BLOOD LEFT WRIST  Final   Special Requests   Final     BOTTLES DRAWN AEROBIC AND ANAEROBIC Blood Culture adequate volume   Culture   Final    NO GROWTH 3 DAYS Performed at [facility], [address] [place], [place] [address]    Report Status PENDING  Incomplete  MRSA PCR Screening     Status: None   Collection Time: 05/15/19  3:29 PM   Specimen: Nasal Mucosa; Nasopharyngeal  Result Value Ref Range Status   MRSA by PCR NEGATIVE NEGATIVE Final    Comment:        The GeneXpert MRSA Assay (FDA approved for NASAL specimens only), is one component of a comprehensive MRSA colonization surveillance program. It is not intended to diagnose MRSA infection nor to guide or monitor treatment for MRSA infections. Performed at [facility], [address], [place], [place] [address]   Respiratory Panel by RT PCR (Flu A&B, Covid) - Nasopharyngeal Swab     Status: None   Collection Time: 05/15/19  3:45 PM   Specimen: Nasopharyngeal Swab  Result Value Ref Range Status   SARS Coronavirus 2 by RT PCR NEGATIVE NEGATIVE Final    Comment: (NOTE) SARS-CoV-2 target nucleic acids are NOT DETECTED. The SARS-CoV-2 RNA is generally detectable in upper respiratoy specimens during the acute phase of infection. The lowest concentration of SARS-CoV-2 viral copies this assay can detect is 131 copies/mL. A negative result does not preclude SARS-Cov-2 infection and should not be used as the sole basis for treatment or other patient management decisions. A negative result may occur with  improper specimen collection/handling, submission of specimen other than nasopharyngeal swab, presence of viral mutation(s) within the areas targeted by this assay, and inadequate number of viral copies (<131 copies/mL). A negative result must be combined with clinical observations, patient history, and epidemiological information. The expected result is Negative. Fact Sheet for Patients:  PinkCheek.be Fact Sheet for Healthcare Providers:   GravelBags.it This test is not yet ap proved or cleared by the [place] FDA and  has been authorized for detection and/or diagnosis of SARS-CoV-2 by FDA under an Emergency Use Authorization (EUA). This EUA will remain  in effect (meaning this test can be used) for the duration of the COVID-19 declaration under Section 564(b)(1) of the Act, 21 U.S.C. section 360bbb-3(b)(1), unless the authorization is terminated or revoked sooner.    Influenza A by PCR NEGATIVE NEGATIVE Final   Influenza B by PCR NEGATIVE NEGATIVE Final    Comment: (NOTE) The Xpert Xpress SARS-CoV-2/FLU/RSV assay is intended as an aid in  the diagnosis of influenza from Nasopharyngeal swab specimens and  should not be used as a sole basis for treatment. Nasal washings and  aspirates are unacceptable for Xpert Xpress SARS-CoV-2/FLU/RSV  testing. Fact Sheet for Patients: PinkCheek.be Fact Sheet for Healthcare Providers: GravelBags.it This test is not yet approved or cleared by the [place] FDA and  has been authorized for detection and/or diagnosis of SARS-CoV-2 by  FDA under an Emergency Use Authorization (EUA). This EUA will remain  in effect (meaning this test can be used) for the duration of the  Covid-19 declaration under Section 564(b)(1) of the Act, 21  U.S.C. section 360bbb-3(b)(1), unless the authorization is  terminated  or revoked. Performed at [facility], [address] [place], [place] [address]   Urine culture     Status: None   Collection Time: 05/15/19 10:57 PM   Specimen: Urine, Clean Catch  Result Value Ref Range Status   Specimen Description   Final    URINE, CLEAN CATCH Performed at [facility], [address] [place], [place] [address]    Special Requests   Final    NONE Performed at [facility], [address] [place], [place] [address]    Culture   Final    NO GROWTH Performed at [facility], [place] [address], [place], [place] [address]    Report Status 05/17/2019 FINAL  Final       Today   Subjective    Chase Herrera today has no new complaints No fever  Or chills   No Nausea, Vomiting or Diarrhea          Patient has been seen and examined prior to discharge   Objective   Blood pressure 133/63, pulse 81, temperature 98.5 F (36.9 C), resp. rate 20, height 6\' 1"  (1.854 m), weight 114 kg, SpO2 100 %.   Intake/Output Summary (Last 24 hours) at 05/18/2019 1719 Last data filed at 05/18/2019 1600 Gross per 24 hour  Intake 729.89 ml  Output 2250 ml  Net -1520.11 ml   Exam Gen:- Awake Alert, in no acute distress, obese HEENT:- [place].AT, No sclera icterus Neck-Supple Neck,No JVD,.  Lungs-improving air movement bilaterally , no wheezing CV- S1, S2 normal, regular , Port-A-Cath in situ left subclavian area does not look infected Abd-  +ve B.Sounds, Abd Soft, No tenderness, increased truncal adiposity    Extremity/Skin:-Trace edema, pedal pulses present  Psych-affect is appropriate, oriented x3 Neuro-generalized weakness no new focal deficits, no tremors   Data Review   CBC w Diff:  Lab Results  Component Value Date   WBC 5.0 05/18/2019   HGB 10.4 (L) 05/18/2019   HCT 33.1 (L) 05/18/2019   PLT 54 (L) 05/18/2019   LYMPHOPCT 10 05/18/2019   MONOPCT 17 05/18/2019   EOSPCT 1 05/18/2019   BASOPCT 1 05/18/2019    CMP:  Lab Results  Component Value Date   NA 137 05/18/2019   K 4.0 05/18/2019   CL 104 05/18/2019   CO2 25 05/18/2019   BUN 12 05/18/2019   CREATININE 0.99 05/18/2019   CREATININE 1.09 06/03/2017   PROT 6.4 (L) 05/11/2019   ALBUMIN 3.5 05/11/2019   BILITOT 0.7 05/11/2019   ALKPHOS 52 05/11/2019   AST 31 05/11/2019   ALT 53 (H) 05/11/2019    Total Discharge time is about 33 minutes  Roxan Hockey M.D on 05/18/2019 at 5:19 PM  Go to [email or web address] -  for contact info  [facility] - Office  [phone]

## 2019-05-18 NOTE — Plan of Care (Signed)

## 2019-05-18 NOTE — Progress Notes (Signed)
Pt has complained of a bad cough since 2000. Pt had just received a dose of Robitussin around 1800. So I paged the MD and was given new orders to administer Tessalon pearls. Medication was given. Pt complained of no relief, so Robitussin was given again around 2200 with other scheduled medications. Pt continues to call out about cough. Occasional coughs are heard at nurses station from patient's room. Pt is not in distress. Talked about medication times with patient around midnight. Adv that he doesn't have anything else that I could give him. Adv that MD was notified again and no new orders were given at this time. Pt states that he takes Robitussin around the clock at home. Adv that we have to follow orders and his Robitussin is Q4PRN. Pt stated that he would like to me contact doctor once again about his cough. MD was notified for the third time. Tessalon pearls were added Q6PRN. This nurse when to check on patient an this time to inform of new MD orders but patient is sleeping at this time. Will continue to monitor patient throughout the night.

## 2019-05-18 NOTE — Progress Notes (Signed)
Inpatient Diabetes Program Recommendations  AACE/ADA: New Consensus Statement on Inpatient Glycemic Control   Target Ranges:  Prepandial:   less than 140 mg/dL      Peak postprandial:   less than 180 mg/dL (1-2 hours)      Critically ill patients:  140 - 180 mg/dL   Results for MARSELINO, Chase Herrera" (MRN [id number]) as of 05/18/2019 08:43  Ref. Range 05/17/2019 07:30 05/17/2019 11:02 05/17/2019 16:18 05/17/2019 21:39 05/18/2019 07:52  Glucose-Capillary Latest Ref Range: 70 - 99 mg/dL 166 (H) 214 (H) 167 (H) 261 (H) 149 (H)    Review of Glycemic Control  Diabetes history: DM2 Outpatient Diabetes medications: Lantus 20 units BID, Humalog 2-20 units TID with meals, Metformin 500 mg BID Current orders for Inpatient glycemic control: Lantus 20 units BID, Novolog 0-15 units TID with meals, Novolog 0-5 units QHS  Inpatient Diabetes Program Recommendations:   Insulin - Meal Coverage: Please consider ordering Novolog 3 units TID with meals for meal coverage if patient eats at least 50% of meals.  Thanks, Barnie Alderman, RN, MSN, CDE Diabetes Coordinator Inpatient Diabetes Program [phone] (Team Pager from 8am to 5pm)

## 2019-05-18 NOTE — Progress Notes (Signed)
Nsg Discharge Note  Admit Date:  05/15/2019 Discharge date: 05/18/2019   Chase Herrera to be D/C'd home per MD order.  AVS completed.  Copy for chart, and copy for patient signed, and dated. Patient/caregiver able to verbalize understanding.  Discharge Medication: Allergies as of 05/18/2019   No Known Allergies     Medication List    STOP taking these medications   atezolizumab 1200 MG/20ML Commonly known as: TECENTRIQ   CARBOPLATIN IV   ETOPOSIDE IV   senna 8.6 MG tablet Commonly known as: SENOKOT     TAKE these medications   Accu-Chek Aviva Plus test strip Generic drug: glucose blood check blood sugar TWICE DAILY   acetaminophen 500 MG tablet Commonly known as: TYLENOL Take 1,000 mg by mouth every 6 (six) hours as needed for moderate pain or headache.   Adjustable Lancing Device Misc TO check blood glucose daily   apixaban 5 MG Tabs tablet Commonly known as: ELIQUIS Take 5 mg by mouth 2 (two) times daily.   AquaLance Lancets 30G Misc USE TO check blood glucose twice daily   atorvastatin 20 MG tablet Commonly known as: LIPITOR Take 1 tablet (20 mg total) by mouth daily.   benzonatate 200 MG capsule Commonly known as: TESSALON Take 1 capsule (200 mg total) by mouth 3 (three) times daily as needed for cough.   Biotene Dry Mouth Moisturizing Soln Apply 1 Dose topically as needed (dry mouth).   cefdinir 300 MG capsule Commonly known as: OMNICEF Take 1 capsule (300 mg total) by mouth 2 (two) times daily for 5 days.   diazepam 5 MG tablet Commonly known as: VALIUM Take 5 mg by mouth once. For MRI   diltiazem 240 MG 24 hr capsule Commonly known as: DILACOR XR Take 240 mg by mouth daily.   dofetilide 250 MCG capsule Commonly known as: Tikosyn Take 1 capsule (250 mcg total) by mouth 2 (two) times daily.   fluticasone 50 MCG/ACT nasal spray Commonly known as: FLONASE Place 2 sprays into both nostrils daily. What changed:   when to take this  reasons  to take this   furosemide 40 MG tablet Commonly known as: LASIX TAKE 1 TABLET BY MOUTH EVERY DAY   gabapentin 300 MG capsule Commonly known as: NEURONTIN Take 600 mg by mouth 2 (two) times daily.   guaifenesin 100 MG/5ML syrup Commonly known as: ROBITUSSIN Take 200 mg by mouth at bedtime as needed for cough.   HYDROcodone-acetaminophen 5-325 MG tablet Commonly known as: NORCO/VICODIN Take 1 tablet by mouth every 4 (four) hours as needed.   hydrOXYzine 25 MG capsule Commonly known as: VISTARIL Take 25 mg by mouth every 8 (eight) hours.   insulin lispro 100 UNIT/ML injection Commonly known as: HUMALOG Inject 2-20 Units into the skin 3 (three) times daily with meals. Sliding scale   ipratropium-albuterol 0.5-2.5 (3) MG/3ML Soln Commonly known as: DUONEB Take 3 mLs by nebulization every 4 (four) hours as needed (shortness of breath).   Lantus SoloStar 100 UNIT/ML Solostar Pen Generic drug: insulin glargine INJECT 20 UNITS UNDER THE SKIN EVERY MORNING AND INJECT 20 UNITS UNDER THE SKIN EVERY EVENING What changed: See the new instructions.   levalbuterol 0.63 MG/3ML nebulizer solution Commonly known as: XOPENEX Inhale 3 mLs (0.63 mg total) into the lungs 4 (four) times daily.   lidocaine-prilocaine cream Commonly known as: EMLA Apply a small amount to port a cath site and cover with plastic wrap 1 hour prior to chemotherapy appointments   LUBRICATING  EYE DROPS OP Place 1 drop into the left eye daily as needed (dry eyes).   metFORMIN 500 MG tablet Commonly known as: GLUCOPHAGE Take 500 mg by mouth in the morning and at bedtime.   metoprolol succinate 100 MG 24 hr tablet Commonly known as: TOPROL-XL Take 100 mg by mouth daily.   multivitamin with minerals Tabs tablet Take 1 tablet by mouth daily.   potassium chloride 10 MEQ tablet Commonly known as: KLOR-CON Take 10 mEq by mouth 2 (two) times daily.   pregabalin 150 MG capsule Commonly known as: LYRICA Take 150  mg by mouth in the morning and at bedtime.   ProAir HFA 108 (90 Base) MCG/ACT inhaler Generic drug: albuterol INHALE TWO PUFFS BY MOUTH EVERY 4 HOURS AS NEEDED FOR WHEEZING What changed: See the new instructions.   prochlorperazine 10 MG tablet Commonly known as: COMPAZINE Take 1 tablet (10 mg total) by mouth every 6 (six) hours as needed (Nausea or vomiting).   SALONPAS PAIN RELIEVING EX Apply 1 patch topically daily.   senna-docusate 8.6-50 MG tablet Commonly known as: Senokot-S Take 2 tablets by mouth at bedtime.   spironolactone 25 MG tablet Commonly known as: ALDACTONE Take 12.5 mg by mouth 2 (two) times daily.   tamsulosin 0.4 MG Caps capsule Commonly known as: FLOMAX Take 1 capsule (0.4 mg total) by mouth 2 (two) times daily after a meal.   tiotropium 18 MCG inhalation capsule Commonly known as: Spiriva HandiHaler Place 1 capsule (18 mcg total) into inhaler and inhale daily. What changed:   when to take this  reasons to take this   traZODone 50 MG tablet Commonly known as: DESYREL Take 50 mg by mouth at bedtime.   Trelegy Ellipta 100-62.5-25 MCG/INH Aepb Generic drug: Fluticasone-Umeclidin-Vilant Inhale 1 puff into the lungs daily.       Discharge Assessment: Vitals:   05/18/19 0953 05/18/19 1402  BP:  133/63  Pulse:  81  Resp:  20  Temp:  98.5 F (36.9 C)  SpO2: 96% 100%   Skin clean, dry and intact without evidence of skin break down, no evidence of skin tears noted. IV catheter discontinued intact. Site without signs and symptoms of complications - no redness or edema noted at insertion site, patient denies c/o pain - only slight tenderness at site.  Dressing with slight pressure applied.  D/c Instructions-Education: Discharge instructions given to patient/family with verbalized understanding. D/c education completed with patient/family including follow up instructions, medication list, d/c activities limitations if indicated, with other d/c  instructions as indicated by MD - patient able to verbalize understanding, all questions fully answered. Patient instructed to return to ED, call 911, or call MD for any changes in condition.  Patient escorted via [place], and D/C home via private auto.  Zachery Conch, RN 05/18/2019 5:14 PM

## 2019-05-18 NOTE — TOC Transition Note (Signed)
Transition of Care [facility]) - CM/SW Discharge Note   Patient Details  Name: Chase Herrera MRN: [id number] Date of Birth: 1950/04/16  Transition of Care [facility]) CM/SW Contact:  Shade Flood, LCSW Phone Number: 05/18/2019, 4:50 PM   Clinical Narrative:     Pt stable for dc today per MD. Damaris Schooner with pt about [facility] and he is agreeable. Referred as requested. There are no other TOC needs for dc.  Final next level of care: [place] Barriers to Discharge: Barriers Resolved   Patient Goals and CMS Choice Patient states their goals for this hospitalization and ongoing recovery are:: Discharge home with [facility] CMS Medicare.gov Compare Post Acute Care list provided to:: Patient Choice offered to / list presented to : Patient  Discharge Placement                       Discharge Plan and Services     Post Acute Care Choice: Home Health                    HH Arranged: PT [facility] Agency: [facility] [facility] Date [place]: 05/18/19   Representative spoke with at [place]: [place] ([place]) Interventions     Readmission Risk Interventions No flowsheet data found.

## 2019-05-18 NOTE — Discharge Instructions (Signed)
1) you are taking apixaban/Eliquis which is a blood thinner so Avoid ibuprofen/Advil/Aleve/Motrin/Goody Powders/Naproxen/BC powders/Meloxicam/Diclofenac/Indomethacin and other Nonsteroidal anti-inflammatory medications as these will make you more likely to bleed and can cause stomach ulcers, can also cause Kidney problems.   2) follow-up with Dr. Delton Coombes within a week for recheck and reevaluation

## 2019-05-20 LAB — CULTURE, BLOOD (ROUTINE X 2)
Culture: NO GROWTH
Culture: NO GROWTH
Special Requests: ADEQUATE
Special Requests: ADEQUATE

## 2019-05-21 ENCOUNTER — Inpatient Hospital Stay (HOSPITAL_COMMUNITY): Payer: Medicare Other

## 2019-05-21 ENCOUNTER — Inpatient Hospital Stay (HOSPITAL_COMMUNITY): Payer: Medicare Other | Admitting: Hematology

## 2019-05-21 ENCOUNTER — Other Ambulatory Visit: Payer: Self-pay

## 2019-05-21 VITALS — BP 110/54 | HR 76 | Temp 96.8°F | Resp 19

## 2019-05-21 DIAGNOSIS — R531 Weakness: Secondary | ICD-10-CM | POA: Diagnosis not present

## 2019-05-21 DIAGNOSIS — Z79899 Other long term (current) drug therapy: Secondary | ICD-10-CM | POA: Diagnosis not present

## 2019-05-21 DIAGNOSIS — R200 Anesthesia of skin: Secondary | ICD-10-CM | POA: Diagnosis not present

## 2019-05-21 DIAGNOSIS — I2510 Atherosclerotic heart disease of native coronary artery without angina pectoris: Secondary | ICD-10-CM | POA: Diagnosis not present

## 2019-05-21 DIAGNOSIS — Z596 Low income: Secondary | ICD-10-CM | POA: Diagnosis not present

## 2019-05-21 DIAGNOSIS — I509 Heart failure, unspecified: Secondary | ICD-10-CM | POA: Diagnosis not present

## 2019-05-21 DIAGNOSIS — Z5189 Encounter for other specified aftercare: Secondary | ICD-10-CM | POA: Diagnosis not present

## 2019-05-21 DIAGNOSIS — Z5111 Encounter for antineoplastic chemotherapy: Secondary | ICD-10-CM | POA: Diagnosis present

## 2019-05-21 DIAGNOSIS — J449 Chronic obstructive pulmonary disease, unspecified: Secondary | ICD-10-CM | POA: Diagnosis not present

## 2019-05-21 DIAGNOSIS — R0602 Shortness of breath: Secondary | ICD-10-CM | POA: Diagnosis not present

## 2019-05-21 DIAGNOSIS — Z5112 Encounter for antineoplastic immunotherapy: Secondary | ICD-10-CM | POA: Diagnosis present

## 2019-05-21 DIAGNOSIS — M549 Dorsalgia, unspecified: Secondary | ICD-10-CM | POA: Diagnosis not present

## 2019-05-21 DIAGNOSIS — C3491 Malignant neoplasm of unspecified part of right bronchus or lung: Secondary | ICD-10-CM

## 2019-05-21 DIAGNOSIS — Z794 Long term (current) use of insulin: Secondary | ICD-10-CM | POA: Diagnosis not present

## 2019-05-21 DIAGNOSIS — C3411 Malignant neoplasm of upper lobe, right bronchus or lung: Secondary | ICD-10-CM | POA: Diagnosis present

## 2019-05-21 DIAGNOSIS — I700 Atherosclerosis of aorta: Secondary | ICD-10-CM | POA: Diagnosis not present

## 2019-05-21 DIAGNOSIS — I429 Cardiomyopathy, unspecified: Secondary | ICD-10-CM | POA: Diagnosis not present

## 2019-05-21 DIAGNOSIS — M7989 Other specified soft tissue disorders: Secondary | ICD-10-CM | POA: Diagnosis not present

## 2019-05-21 DIAGNOSIS — C3490 Malignant neoplasm of unspecified part of unspecified bronchus or lung: Secondary | ICD-10-CM | POA: Diagnosis not present

## 2019-05-21 DIAGNOSIS — I4891 Unspecified atrial fibrillation: Secondary | ICD-10-CM | POA: Diagnosis not present

## 2019-05-21 DIAGNOSIS — I110 Hypertensive heart disease with heart failure: Secondary | ICD-10-CM | POA: Diagnosis not present

## 2019-05-21 DIAGNOSIS — E119 Type 2 diabetes mellitus without complications: Secondary | ICD-10-CM | POA: Diagnosis not present

## 2019-05-21 DIAGNOSIS — M1990 Unspecified osteoarthritis, unspecified site: Secondary | ICD-10-CM | POA: Diagnosis not present

## 2019-05-21 DIAGNOSIS — Z7901 Long term (current) use of anticoagulants: Secondary | ICD-10-CM | POA: Diagnosis not present

## 2019-05-21 DIAGNOSIS — M545 Low back pain: Secondary | ICD-10-CM | POA: Diagnosis not present

## 2019-05-21 DIAGNOSIS — D696 Thrombocytopenia, unspecified: Secondary | ICD-10-CM | POA: Diagnosis not present

## 2019-05-21 LAB — CBC WITH DIFFERENTIAL/PLATELET
Abs Immature Granulocytes: 0.26 K/uL — ABNORMAL HIGH (ref 0.00–0.07)
Basophils Absolute: 0.1 K/uL (ref 0.0–0.1)
Basophils Relative: 1 %
Eosinophils Absolute: 0 K/uL (ref 0.0–0.5)
Eosinophils Relative: 1 %
HCT: 37.1 % — ABNORMAL LOW (ref 39.0–52.0)
Hemoglobin: 11.4 g/dL — ABNORMAL LOW (ref 13.0–17.0)
Immature Granulocytes: 5 %
Lymphocytes Relative: 11 %
Lymphs Abs: 0.5 K/uL — ABNORMAL LOW (ref 0.7–4.0)
MCH: 31.1 pg (ref 26.0–34.0)
MCHC: 30.7 g/dL (ref 30.0–36.0)
MCV: 101.1 fL — ABNORMAL HIGH (ref 80.0–100.0)
Monocytes Absolute: 0.7 K/uL (ref 0.1–1.0)
Monocytes Relative: 15 %
Neutro Abs: 3.3 K/uL (ref 1.7–7.7)
Neutrophils Relative %: 67 %
Platelets: 113 K/uL — ABNORMAL LOW (ref 150–400)
RBC: 3.67 MIL/uL — ABNORMAL LOW (ref 4.22–5.81)
RDW: 14.7 % (ref 11.5–15.5)
WBC: 4.8 K/uL (ref 4.0–10.5)
nRBC: 0 % (ref 0.0–0.2)

## 2019-05-21 LAB — COMPREHENSIVE METABOLIC PANEL
ALT: 43 U/L (ref 0–44)
AST: 24 U/L (ref 15–41)
Albumin: 3.4 g/dL — ABNORMAL LOW (ref 3.5–5.0)
Alkaline Phosphatase: 64 U/L (ref 38–126)
Anion gap: 10 (ref 5–15)
BUN: 8 mg/dL (ref 8–23)
CO2: 31 mmol/L (ref 22–32)
Calcium: 8.8 mg/dL — ABNORMAL LOW (ref 8.9–10.3)
Chloride: 97 mmol/L — ABNORMAL LOW (ref 98–111)
Creatinine, Ser: 1.02 mg/dL (ref 0.61–1.24)
GFR calc Af Amer: >60 mL/min (ref >60)
GFR calc non Af Amer: >60 mL/min (ref >60)
Glucose, Bld: 311 mg/dL — ABNORMAL HIGH (ref 70–99)
Potassium: 4.3 mmol/L (ref 3.5–5.1)
Sodium: 138 mmol/L (ref 135–145)
Total Bilirubin: 0.3 mg/dL (ref 0.3–1.2)
Total Protein: 6.4 g/dL — ABNORMAL LOW (ref 6.5–8.1)

## 2019-05-21 MED ORDER — SODIUM CHLORIDE 0.9 % IV SOLN
Freq: Once | INTRAVENOUS | Status: AC
  Filled 2019-05-21: qty 1000

## 2019-05-21 MED ORDER — HEPARIN SOD (PORK) LOCK FLUSH 100 UNIT/ML IV SOLN
500.0000 [IU] | Freq: Once | INTRAVENOUS | Status: AC | PRN
  Administered 2019-05-21: 500 [IU]

## 2019-05-21 MED ORDER — HYDROCODONE-ACETAMINOPHEN 5-325 MG PO TABS: 1.0000 | ORAL_TABLET | Freq: Every evening | ORAL | 0 refills | Status: DC | PRN

## 2019-05-21 MED ORDER — SODIUM CHLORIDE 0.9% FLUSH
10.0000 mL | Freq: Once | INTRAVENOUS | Status: AC | PRN
  Administered 2019-05-21: 10 mL

## 2019-05-21 NOTE — Progress Notes (Signed)
[place], [place] [address]   CLINIC:  Medical Oncology/Hematology  PCP:  [facility], [facility] [place] [address] [phone]   REASON FOR VISIT:  Extensive small cell lung cancer.  CURRENT THERAPY: Carboplatin, VP-16 and atezolizumab.  BRIEF ONCOLOGIC HISTORY:  Oncology History  Small cell lung cancer ([place])  04/03/2016 Initial Diagnosis   Small cell lung cancer ([place])   04/09/2016 Imaging   MRI brain- Negative for metastatic disease to the brain. No acute abnormality.   04/09/2016 Procedure   Right IJ port catheter placement by IR   04/13/2016 PET scan   . The right apical lung mass has significantly enlarged and there is new right paratracheal adenopathy as well as a hypermetabolic new lymph node in the right inner infraclavicular fossa potentially with early impingement on adjacent neurovascular structures. 2. Other imaging findings of potential clinical significance: Chronic bilateral maxillary sinusitis. Left carotid atherosclerotic calcification. Aortoiliac atherosclerotic vascular disease. Coronary atherosclerosis. Paraseptal emphysema. Mild atelectasis in the lung bases. Scattered sigmoid colon diverticula. Probable right pars defect at L5.   05/09/2016 - 07/12/2016 Radiation Therapy   Radiotherapy to right Lung+MS 63 Gy/35 fractions at 1.8 GY/f using 6 x photons 3- D CRT technique Dates: 4/18-6/21/18       06/19/2016 Treatment Plan Change   Cisplatin dose reduced by 20% for cycle #4 due to severe thrombocytopenia following cycle #3 (13,000).   08/21/2016 PET scan   1. Marked reduction in size and metabolic activity of RIGHT upper lobe mass. 2. Resolution of mediastinal and RIGHT supraclavicular lymphadenopathy. 3. No evidence of disease progression.   05/06/2019 -  Chemotherapy   The patient had palonosetron (ALOXI) injection 0.25 mg, 0.25 mg, Intravenous,  Once, 1 of 4  cycles Administration: 0.25 mg (05/06/2019) pegfilgrastim-cbqv (UDENYCA) injection 6 mg, 6 mg, Subcutaneous, Once, 1 of 4 cycles Administration: 6 mg (05/11/2019) CARBOplatin (PARAPLATIN) 450 mg in sodium chloride 0.9 % 250 mL chemo infusion, 450 mg (100 % of original dose 446.4 mg), Intravenous,  Once, 1 of 4 cycles Dose modification:   (original dose 446.4 mg, Cycle 1, Reason: Provider Judgment) Administration: 450 mg (05/06/2019) etoposide (VEPESID) 190 mg in sodium chloride 0.9 % 500 mL chemo infusion, 80 mg/m2 = 190 mg (80 % of original dose 100 mg/m2), Intravenous,  Once, 1 of 4 cycles Dose modification: 80 mg/m2 (80 % of original dose 100 mg/m2, Cycle 1, Reason: Provider Judgment), 80 mg/m2 (original dose 100 mg/m2, Cycle 1, Reason: Provider Judgment) Administration: 190 mg (05/06/2019), 190 mg (05/07/2019), 190 mg (05/08/2019) fosaprepitant (EMEND) 150 mg in sodium chloride 0.9 % 145 mL IVPB, 150 mg, Intravenous,  Once, 1 of 4 cycles Administration: 150 mg (05/06/2019) atezolizumab (TECENTRIQ) 1,200 mg in sodium chloride 0.9 % 250 mL chemo infusion, 1,200 mg, Intravenous, Once, 1 of 8 cycles Administration: 1,200 mg (05/06/2019)  for chemotherapy treatment.       CANCER STAGING: Cancer Staging Small cell lung cancer ([place]) Staging form: Lung, AJCC 8th Edition - Clinical: Stage IIIC (cT3, cN3, cM0) - Signed by Twana First, MD on 04/16/2016    INTERVAL HISTORY:  Mr. Moomaw 69 y.o. male seen for follow-up of extensive stage small cell lung cancer.  He was recently hospitalized with shortness of breath and neutropenia.  He received IV antibiotics.  He feels much better today although somewhat weak.  Reports low back pain.  Cough and shortness of breath on exertion is stable.   REVIEW OF SYSTEMS:  Review of Systems  Respiratory: Positive for cough and shortness of breath.   Musculoskeletal: Positive for back pain.  Neurological: Positive for numbness.  All other systems reviewed and are  negative.    PAST MEDICAL/SURGICAL HISTORY:  Past Medical History:  Diagnosis Date  . Alcohol abuse    Quit 08/23/11  . Arthritis   . Atrial fibrillation ([place])   . Atrial flutter ([place])    CTI ablation by Dr [name] 10/2011  . Bacteremia due to Gram-positive bacteria 11/29/2016  . CHF (congestive heart failure) ([place])   . COPD (chronic obstructive pulmonary disease) ([place])   . Depression   . Essential hypertension   . History of cardiomyopathy    LVEF 25-30% 08/2011 with subsequent normalization  . History of kidney stones   . Obesity   . Prostate enlargement   . Small cell lung cancer ([place])    Right upper lobe - follows with [facility]  . Type 2 diabetes mellitus ([place])    Past Surgical History:  Procedure Laterality Date  . APPENDECTOMY    . ATRIAL ABLATION SURGERY  11/06/2011   CTI ablation for atrial flutter by Dr [name]  . ATRIAL FLUTTER ABLATION N/A 11/06/2011   Procedure: ATRIAL FLUTTER ABLATION;  Surgeon: Thompson Grayer, MD;  Location[facility] C[facility] LAB;  Service: Cardiovascular;  Laterality: N/A;  . Cataracts    . COLONOSCOPY WITH PROPOFOL N/A 06/06/2017   Procedure: COLONOSCOPY WITH PROPOFOL;  Surgeon: [name], MD;  Location: [facility] ENDO SUITE;  Service: Endoscopy;  Laterality: N/A;  1:15pm  . IR GENERIC HISTORICAL  04/09/2016   IR US GUIDE VASC ACCESS RIGHT 04/09/2016 [name], DO [facility]-INTERV RAD  . IR GENERIC HISTORICAL  04/09/2016   IR FLUORO GUIDE PORT INSERTION RIGHT 04/09/2016 [name], DO [facility]-INTER[facility] RAD  . PORT-A-CATH REMOVAL N/A 04/24/2017   Procedure: MINOR REMOVAL PORT-A-CATH;  Surgeon: [name], MD;  Location: [facility] ORS;  Service: General;  Laterality: N/A;  . PORTACATH PLACEMENT Left 04/22/2019   Procedure: INSERTION PORT-A-CATH;  Surgeon: Aviva Signs, MD;  Location: [facility] ORS;  Service: General;  Laterality: Left;  . TONSILLECTOMY       SOCIAL HISTORY:  Social History   Socioeconomic History  . Marital status: Single    Spouse name: Not on  file  . Number of children: Not on file  . Years of education: Not on file  . Highest education level: Not on file  Occupational History  . Occupation: retired     Comment: Building services engineer  Tobacco Use  . Smoking status: Former Smoker    Packs/day: 0.50    Years: 47.00    Pack years: 23.50    Types: Cigarettes    Quit date: 05/30/2012    Years since quitting: 6.9  . Smokeless tobacco: Former Systems developer    Quit date: 04/10/2014  . Tobacco comment: encouraged to quit today 03/04/12  Substance and Sexual Activity  . Alcohol use: No    Comment: quit 3 1/2 years ago  . Drug use: Not Currently    Comment: marajuana occasionally  . Sexual activity: Not Currently  Other Topics Concern  . Not on file  Social History Narrative   Lives in [place] alone.   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 Difficulty of Paying Living Expenses:   Food Insecurity:   . Worried About [employer] of  Food in the Last Year:   . [place] in the Last Year:   Transportation Needs:   . Film/video editor (Medical):   Marland Kitchen Lack of Transportation (Non-Medical):   Physical Activity:   . Days of Exercise per Week:   . Minutes of Exercise per Session:   Stress:   . Feeling of Stress :   Social Connections:   . Frequency of Communication with Friends and Family:   . Frequency of Social Gatherings with Friends and Family:   . Attends Religious Services:   . Active Member of Clubs or Organizations:   . Attends Archivist Meetings:   Marland Kitchen Marital Status:   Intimate Partner Violence:   . Fear of Current or Ex-Partner:   . Emotionally Abused:   Marland Kitchen Physically Abused:   . Sexually Abused:     FAMILY HISTORY:  Family History  Problem Relation Age of Onset  . Diabetes Sister   . Hypertension Mother   . Lung cancer Mother   . Hypertension Father   . Heart attack Father   . Hypertension Sister   .  Hypertension Sister   . Hypertension Sister   . Pulmonary fibrosis Sister   . Breast cancer Cousin   . Colon cancer Neg Hx     CURRENT MEDICATIONS:  Outpatient Encounter Medications as of 05/21/2019  Medication Sig  . ACCU-CHEK AVIVA PLUS test strip check blood sugar TWICE DAILY  . apixaban (ELIQUIS) 5 MG TABS tablet Take 5 mg by mouth 2 (two) times daily.   Marland Kitchen AQUALANCE LANCETS 30G MISC USE TO check blood glucose twice daily  . atorvastatin (LIPITOR) 20 MG tablet Take 1 tablet (20 mg total) by mouth daily.  . cefdinir (OMNICEF) 300 MG capsule Take 1 capsule (300 mg total) by mouth 2 (two) times daily for 5 days.  Marland Kitchen diltiazem (DILACOR XR) 240 MG 24 hr capsule Take 240 mg by mouth daily.  Marland Kitchen dofetilide (TIKOSYN) 250 MCG capsule Take 1 capsule (250 mcg total) by mouth 2 (two) times daily.  . Fluticasone-Umeclidin-Vilant (TRELEGY ELLIPTA) 100-62.5-25 MCG/INH AEPB Inhale 1 puff into the lungs daily.  . furosemide (LASIX) 40 MG tablet TAKE 1 TABLET BY MOUTH EVERY DAY (Patient taking differently: Take 40 mg by mouth daily. )  . gabapentin (NEURONTIN) 300 MG capsule Take 600 mg by mouth 2 (two) times daily.   . hydrOXYzine (VISTARIL) 25 MG capsule Take 25 mg by mouth every 8 (eight) hours.  . insulin lispro (HUMALOG) 100 UNIT/ML injection Inject 2-20 Units into the skin 3 (three) times daily with meals. Sliding scale   . Lancet Devices (ADJUSTABLE LANCING DEVICE) MISC TO check blood glucose daily  . LANTUS SOLOSTAR 100 UNIT/ML Solostar Pen INJECT 20 UNITS UNDER THE SKIN EVERY MORNING AND INJECT 20 UNITS UNDER THE SKIN EVERY EVENING (Patient taking differently: Inject 20 Units into the skin 2 (two) times daily. )  . levalbuterol (XOPENEX) 0.63 MG/3ML nebulizer solution Inhale 3 mLs (0.63 mg total) into the lungs 4 (four) times daily.  . metFORMIN (GLUCOPHAGE) 500 MG tablet Take 500 mg by mouth in the morning and at bedtime.  . metoprolol succinate (TOPROL-XL) 100 MG 24 hr tablet Take 100 mg by mouth  daily.  . Multiple Vitamin (MULTIVITAMIN WITH MINERALS) TABS tablet Take 1 tablet by mouth daily.  . potassium chloride (KLOR-CON) 10 MEQ tablet Take 10 mEq by mouth 2 (two) times daily.  . pregabalin (LYRICA) 150 MG capsule Take 150 mg by mouth  in the morning and at bedtime.  . senna-docusate (SENOKOT-S) 8.6-50 MG tablet Take 2 tablets by mouth at bedtime.  Marland Kitchen spironolactone (ALDACTONE) 25 MG tablet Take 12.5 mg by mouth 2 (two) times daily.   . tamsulosin (FLOMAX) 0.4 MG CAPS capsule Take 1 capsule (0.4 mg total) by mouth 2 (two) times daily after a meal.  . traZODone (DESYREL) 50 MG tablet Take 50 mg by mouth at bedtime.  Marland Kitchen acetaminophen (TYLENOL) 500 MG tablet Take 1,000 mg by mouth every 6 (six) hours as needed for moderate pain or headache.  . Artificial Saliva (BIOTENE DRY MOUTH MOISTURIZING) SOLN Apply 1 Dose topically as needed (dry mouth).   . benzonatate (TESSALON) 200 MG capsule Take 1 capsule (200 mg total) by mouth 3 (three) times daily as needed for cough. (Patient not taking: Reported on 05/21/2019)  . Carboxymethylcellul-Glycerin (LUBRICATING EYE DROPS OP) Place 1 drop into the left eye daily as needed (dry eyes).  . fluticasone (FLONASE) 50 MCG/ACT nasal spray Place 2 sprays into both nostrils daily. (Patient not taking: Reported on 05/21/2019)  . guaifenesin (ROBITUSSIN) 100 MG/5ML syrup Take 200 mg by mouth at bedtime as needed for cough.  Marland Kitchen HYDROcodone-acetaminophen (NORCO/VICODIN) 5-325 MG tablet Take 1 tablet by mouth every 4 (four) hours as needed. (Patient not taking: Reported on 05/21/2019)  . ipratropium-albuterol (DUONEB) 0.5-2.5 (3) MG/3ML SOLN Take 3 mLs by nebulization every 4 (four) hours as needed (shortness of breath).   . Lidocaine (SALONPAS PAIN RELIEVING EX) Apply 1 patch topically daily.   Marland Kitchen lidocaine-prilocaine (EMLA) cream Apply a small amount to port a cath site and cover with plastic wrap 1 hour prior to chemotherapy appointments (Patient not taking: Reported on  05/21/2019)  . PROAIR HFA 108 (90 Base) MCG/ACT inhaler INHALE TWO PUFFS BY MOUTH EVERY 4 HOURS AS NEEDED FOR WHEEZING (Patient not taking: No sig reported)  . prochlorperazine (COMPAZINE) 10 MG tablet Take 1 tablet (10 mg total) by mouth every 6 (six) hours as needed (Nausea or vomiting). (Patient not taking: Reported on 05/21/2019)  . tiotropium (SPIRIVA HANDIHALER) 18 MCG inhalation capsule Place 1 capsule (18 mcg total) into inhaler and inhale daily. (Patient not taking: Reported on 05/21/2019)  . [DISCONTINUED] diazepam (VALIUM) 5 MG tablet Take 5 mg by mouth once. For MRI   No facility-administered encounter medications on file as of 05/21/2019.    ALLERGIES:  No Known Allergies   PHYSICAL EXAM:  ECOG Performance status: 1  Vitals:   05/21/19 1033  BP: (!) 97/44  Pulse: 77  Resp: 19  Temp: (!) 96.8 F (36 C)  SpO2: 91%   Filed Weights   05/21/19 1033  Weight: 253 lb (114.8 kg)    Physical Exam Vitals reviewed.  Constitutional:      Appearance: Normal appearance.  Cardiovascular:     Rate and Rhythm: Normal rate and regular rhythm.     Heart sounds: Normal heart sounds.  Pulmonary:     Effort: Pulmonary effort is normal.     Comments: Crepitations in the left lower base heard. Abdominal:     General: There is no distension.     Palpations: Abdomen is soft. There is no mass.  Lymphadenopathy:     Cervical: No cervical adenopathy.  Skin:    General: Skin is warm.  Neurological:     General: No focal deficit present.     Mental Status: He is alert and oriented to person, place, and time.  Psychiatric:  Mood and Affect: Mood normal.        Behavior: Behavior normal.      LABORATORY DATA:  I have reviewed the labs as listed.  CBC    Component Value Date/Time   WBC 4.8 05/21/2019 0915   RBC 3.67 (L) 05/21/2019 0915   HGB 11.4 (L) 05/21/2019 0915   HCT 37.1 (L) 05/21/2019 0915   PLT 113 (L) 05/21/2019 0915   MCV 101.1 (H) 05/21/2019 0915   MCH 31.1  05/21/2019 0915   MCHC 30.7 05/21/2019 0915   RDW 14.7 05/21/2019 0915   LYMPHSABS 0.5 (L) 05/21/2019 0915   MONOABS 0.7 05/21/2019 0915   EOSABS 0.0 05/21/2019 0915   BASOSABS 0.1 05/21/2019 0915   CMP Latest Ref Rng & Units 05/21/2019 05/18/2019 05/15/2019  Glucose 70 - 99 mg/dL 311(H) 206(H) 186(H)  BUN 8 - 23 mg/dL 8 12 19   Creatinine 0.61 - 1.24 mg/dL 1.02 0.99 0.82  Sodium 135 - 145 mmol/L 138 137 134(L)  Potassium 3.5 - 5.1 mmol/L 4.3 4.0 3.9  Chloride 98 - 111 mmol/L 97(L) 104 98  CO2 22 - 32 mmol/L 31 25 27   Calcium 8.9 - 10.3 mg/dL 8.8(L) 8.4(L) 8.8(L)  Total Protein 6.5 - 8.1 g/dL 6.4(L) - -  Total Bilirubin 0.3 - 1.2 mg/dL 0.3 - -  Alkaline Phos 38 - 126 U/L 64 - -  AST 15 - 41 U/L 24 - -  ALT 0 - 44 U/L 43 - -       DIAGNOSTIC IMAGING:  I have reviewed scans.     ASSESSMENT & PLAN:   Small cell lung cancer ([place]) 1.  Recurrent small cell lung cancer: -Cycle 1 of carboplatin, VP-16 and atezolizumab on 05/06/2019 with Neulasta. -Hospitalized from 05/15/2019 through 05/18/2019 with shortness of breath and neutropenia.  He was treated for pneumonia with antibiotics. -He feels better at this time. -I reviewed brain MRI from 05/13/2019.  There is a new small 7 mm rim-enhancing lesion at the junction of the left posterior temporal and anterior occipital lobes compatible with solitary metastasis.  Atypical appearance of Lacunar infarct is also considered. -I have reviewed his labs.  Lites are 139.White count is normal.  LFTs are normal. -He will come back in 1 week for his cycle 2.  We will consider carboplatin AUC 4, etoposide 80 mg per metered squared. -His sugar is elevated at 311.  He will receive hydration today.  2.  Diabetes: -We will continue Metformin.  He will continue Lantus twice daily.  He is on Humalog sliding scale.  His blood sugar is 311.  3.  Atrial fibrillation: -He will continue Eliquis 5 mg twice daily.  No bleeding issues reported.    4.Low back  pain: -He reports low back pain which is not getting better with NSAIDs. -We will start him on hydrocodone 5/325 at bedtime as needed.      Orders placed this encounter:  No orders of the defined types were placed in this encounter.  Total time spent is 30 minutes with more than 50% of the time spent face-to-face discussing and reviewing hospital records, scan results, counseling and coordination of care.   Derek Jack, MD [place] [phone]

## 2019-05-21 NOTE — Patient Instructions (Signed)
[facility] at [facility]  Discharge Instructions:   _______________________________________________________________  Thank you for choosing [facility] at [facility] to provide your oncology and hematology care.  To afford each patient quality time with our providers, please arrive at least 15 minutes before your scheduled appointment.  You need to re-schedule your appointment if you arrive 10 or more minutes late.  We strive to give you quality time with our providers, and arriving late affects you and other patients whose appointments are after yours.  Also, if you no show three or more times for appointments you may be dismissed from the clinic.  Again, thank you for choosing [facility] at [facility]. Our hope is that these requests will allow you access to exceptional care and in a timely manner. _______________________________________________________________  If you have questions after your visit, please contact our office at [phone] between the hours of 8:30 a.m. and 5:00 p.m. Voicemails left after 4:30 p.m. will not be returned until the following business day. _______________________________________________________________  For prescription refill requests, have your pharmacy contact our office. _______________________________________________________________  Recommendations made by the consultant and any test results will be sent to your referring physician. _______________________________________________________________ 

## 2019-05-21 NOTE — Assessment & Plan Note (Signed)
1.  Recurrent small cell lung cancer: -Cycle 1 of carboplatin, VP-16 and atezolizumab on 05/06/2019 with Neulasta. -Hospitalized from 05/15/2019 through 05/18/2019 with shortness of breath and neutropenia.  He was treated for pneumonia with antibiotics. -He feels better at this time. -I reviewed brain MRI from 05/13/2019.  There is a new small 7 mm rim-enhancing lesion at the junction of the left posterior temporal and anterior occipital lobes compatible with solitary metastasis.  Atypical appearance of Lacunar infarct is also considered. -I have reviewed his labs.  Lites are 139.White count is normal.  LFTs are normal. -He will come back in 1 week for his cycle 2.  We will consider carboplatin AUC 4, etoposide 80 mg per metered squared. -His sugar is elevated at 311.  He will receive hydration today.  2.  Diabetes: -We will continue Metformin.  He will continue Lantus twice daily.  He is on Humalog sliding scale.  His blood sugar is 311.  3.  Atrial fibrillation: -He will continue Eliquis 5 mg twice daily.  No bleeding issues reported.    4.Low back pain: -He reports low back pain which is not getting better with NSAIDs. -We will start him on hydrocodone 5/325 at bedtime as needed.

## 2019-05-21 NOTE — Progress Notes (Signed)
Patient presents today for follow up visit with Dr. Delton Coombes and fluids. Vital signs stable.    Hydration given today per MD orders. Tolerated infusion without adverse affects. Vital signs stable. No complaints at this time. Discharged from clinic via wheel chair. F/U with [facility] as scheduled.

## 2019-05-21 NOTE — Progress Notes (Signed)
Patient has been assessed, vital signs and labs have been reviewed by Dr. Delton Coombes. Please give 1 liter fluids with electrolytes over 2 hours.

## 2019-05-21 NOTE — Patient Instructions (Signed)
[facility] Discharge Instructions  You were seen today by Dr. Katragadda. He went over your recent lab results. He will see you back in 1 week for labs, treatment and follow up.   Thank you for choosing [facility] at [facility] to provide your oncology and hematology care.  To afford each patient quality time with our provider, please arrive at least 15 minutes before your scheduled appointment time.   If you have a lab appointment with the Cancer Center please come in thru the  Main Entrance and check in at the main information desk  You need to re-schedule your appointment should you arrive 10 or more minutes late.  We strive to give you quality time with our providers, and arriving late affects you and other patients whose appointments are after yours.  Also, if you no show three or more times for appointments you may be dismissed from the clinic at the providers discretion.     Again, thank you for choosing [facility].  Our hope is that these requests will decrease the amount of time that you wait before being seen by our physicians.       _____________________________________________________________  Should you have questions after your visit to [facility], please contact our office at [phone] between the hours of 8:00 a.m. and 4:30 p.m.  Voicemails left after 4:00 p.m. will not be returned until the following business day.  For prescription refill requests, have your pharmacy contact our office and allow 72 hours.    Cancer Center Support Programs:   > Cancer Support Group  2nd Tuesday of the month 1pm-2pm, Journey Room    

## 2019-05-27 ENCOUNTER — Inpatient Hospital Stay (HOSPITAL_COMMUNITY): Payer: Medicare Other | Admitting: Hematology

## 2019-05-27 ENCOUNTER — Encounter (HOSPITAL_COMMUNITY): Payer: Self-pay | Admitting: Hematology

## 2019-05-27 ENCOUNTER — Other Ambulatory Visit: Payer: Self-pay

## 2019-05-27 ENCOUNTER — Inpatient Hospital Stay (HOSPITAL_COMMUNITY): Payer: Medicare Other

## 2019-05-27 ENCOUNTER — Inpatient Hospital Stay (HOSPITAL_COMMUNITY): Payer: Medicare Other | Attending: Hematology

## 2019-05-27 VITALS — BP 120/58 | HR 71 | Temp 96.8°F

## 2019-05-27 DIAGNOSIS — Z87442 Personal history of urinary calculi: Secondary | ICD-10-CM | POA: Insufficient documentation

## 2019-05-27 DIAGNOSIS — R200 Anesthesia of skin: Secondary | ICD-10-CM | POA: Insufficient documentation

## 2019-05-27 DIAGNOSIS — C3491 Malignant neoplasm of unspecified part of right bronchus or lung: Secondary | ICD-10-CM

## 2019-05-27 DIAGNOSIS — Z5111 Encounter for antineoplastic chemotherapy: Secondary | ICD-10-CM | POA: Diagnosis present

## 2019-05-27 DIAGNOSIS — M1990 Unspecified osteoarthritis, unspecified site: Secondary | ICD-10-CM | POA: Insufficient documentation

## 2019-05-27 DIAGNOSIS — I4891 Unspecified atrial fibrillation: Secondary | ICD-10-CM | POA: Insufficient documentation

## 2019-05-27 DIAGNOSIS — C7931 Secondary malignant neoplasm of brain: Secondary | ICD-10-CM | POA: Insufficient documentation

## 2019-05-27 DIAGNOSIS — Z8249 Family history of ischemic heart disease and other diseases of the circulatory system: Secondary | ICD-10-CM | POA: Diagnosis not present

## 2019-05-27 DIAGNOSIS — R0602 Shortness of breath: Secondary | ICD-10-CM | POA: Diagnosis not present

## 2019-05-27 DIAGNOSIS — Z87891 Personal history of nicotine dependence: Secondary | ICD-10-CM | POA: Diagnosis not present

## 2019-05-27 DIAGNOSIS — J449 Chronic obstructive pulmonary disease, unspecified: Secondary | ICD-10-CM | POA: Diagnosis not present

## 2019-05-27 DIAGNOSIS — Z79899 Other long term (current) drug therapy: Secondary | ICD-10-CM | POA: Diagnosis not present

## 2019-05-27 DIAGNOSIS — M7989 Other specified soft tissue disorders: Secondary | ICD-10-CM | POA: Insufficient documentation

## 2019-05-27 DIAGNOSIS — R05 Cough: Secondary | ICD-10-CM | POA: Diagnosis not present

## 2019-05-27 DIAGNOSIS — Z7901 Long term (current) use of anticoagulants: Secondary | ICD-10-CM | POA: Diagnosis not present

## 2019-05-27 DIAGNOSIS — C3411 Malignant neoplasm of upper lobe, right bronchus or lung: Secondary | ICD-10-CM | POA: Diagnosis present

## 2019-05-27 DIAGNOSIS — M545 Low back pain: Secondary | ICD-10-CM | POA: Insufficient documentation

## 2019-05-27 DIAGNOSIS — C3490 Malignant neoplasm of unspecified part of unspecified bronchus or lung: Secondary | ICD-10-CM

## 2019-05-27 DIAGNOSIS — Z803 Family history of malignant neoplasm of breast: Secondary | ICD-10-CM | POA: Insufficient documentation

## 2019-05-27 DIAGNOSIS — Z5112 Encounter for antineoplastic immunotherapy: Secondary | ICD-10-CM | POA: Insufficient documentation

## 2019-05-27 DIAGNOSIS — R739 Hyperglycemia, unspecified: Secondary | ICD-10-CM

## 2019-05-27 DIAGNOSIS — Z5189 Encounter for other specified aftercare: Secondary | ICD-10-CM | POA: Diagnosis not present

## 2019-05-27 DIAGNOSIS — Z801 Family history of malignant neoplasm of trachea, bronchus and lung: Secondary | ICD-10-CM | POA: Insufficient documentation

## 2019-05-27 DIAGNOSIS — N189 Chronic kidney disease, unspecified: Secondary | ICD-10-CM | POA: Diagnosis not present

## 2019-05-27 DIAGNOSIS — Z836 Family history of other diseases of the respiratory system: Secondary | ICD-10-CM | POA: Insufficient documentation

## 2019-05-27 DIAGNOSIS — Z833 Family history of diabetes mellitus: Secondary | ICD-10-CM | POA: Diagnosis not present

## 2019-05-27 DIAGNOSIS — I2510 Atherosclerotic heart disease of native coronary artery without angina pectoris: Secondary | ICD-10-CM | POA: Diagnosis not present

## 2019-05-27 DIAGNOSIS — E119 Type 2 diabetes mellitus without complications: Secondary | ICD-10-CM | POA: Diagnosis not present

## 2019-05-27 DIAGNOSIS — R5383 Other fatigue: Secondary | ICD-10-CM | POA: Insufficient documentation

## 2019-05-27 LAB — CBC WITH DIFFERENTIAL/PLATELET
Abs Immature Granulocytes: 0.18 10*3/uL — ABNORMAL HIGH (ref 0.00–0.07)
Basophils Absolute: 0.1 10*3/uL (ref 0.0–0.1)
Basophils Relative: 1 %
Eosinophils Absolute: 0 10*3/uL (ref 0.0–0.5)
Eosinophils Relative: 1 %
HCT: 38.4 % — ABNORMAL LOW (ref 39.0–52.0)
Hemoglobin: 12.1 g/dL — ABNORMAL LOW (ref 13.0–17.0)
Immature Granulocytes: 3 %
Lymphocytes Relative: 9 %
Lymphs Abs: 0.5 10*3/uL — ABNORMAL LOW (ref 0.7–4.0)
MCH: 31.4 pg (ref 26.0–34.0)
MCHC: 31.5 g/dL (ref 30.0–36.0)
MCV: 99.7 fL (ref 80.0–100.0)
Monocytes Absolute: 0.8 10*3/uL (ref 0.1–1.0)
Monocytes Relative: 14 %
Neutro Abs: 4 10*3/uL (ref 1.7–7.7)
Neutrophils Relative %: 72 %
Platelets: 218 10*3/uL (ref 150–400)
RBC: 3.85 MIL/uL — ABNORMAL LOW (ref 4.22–5.81)
RDW: 15.3 % (ref 11.5–15.5)
WBC: 5.5 10*3/uL (ref 4.0–10.5)
nRBC: 0 % (ref 0.0–0.2)

## 2019-05-27 LAB — COMPREHENSIVE METABOLIC PANEL
ALT: 43 U/L (ref 0–44)
AST: 27 U/L (ref 15–41)
Albumin: 3.6 g/dL (ref 3.5–5.0)
Alkaline Phosphatase: 65 U/L (ref 38–126)
Anion gap: 9 (ref 5–15)
BUN: 20 mg/dL (ref 8–23)
CO2: 28 mmol/L (ref 22–32)
Calcium: 9.1 mg/dL (ref 8.9–10.3)
Chloride: 96 mmol/L — ABNORMAL LOW (ref 98–111)
Creatinine, Ser: 1.04 mg/dL (ref 0.61–1.24)
GFR calc Af Amer: >60 mL/min (ref >60)
GFR calc non Af Amer: >60 mL/min (ref >60)
Glucose, Bld: 365 mg/dL — ABNORMAL HIGH (ref 70–99)
Potassium: 4.3 mmol/L (ref 3.5–5.1)
Sodium: 133 mmol/L — ABNORMAL LOW (ref 135–145)
Total Bilirubin: 0.4 mg/dL (ref 0.3–1.2)
Total Protein: 6.8 g/dL (ref 6.5–8.1)

## 2019-05-27 MED ORDER — SODIUM CHLORIDE 0.9 % IV SOLN: INTRAVENOUS | Status: DC

## 2019-05-27 MED ORDER — HEPARIN SOD (PORK) LOCK FLUSH 100 UNIT/ML IV SOLN
500.0000 [IU] | Freq: Once | INTRAVENOUS | Status: AC | PRN
  Administered 2019-05-27: 15:00:00 500 [IU]

## 2019-05-27 MED ORDER — SODIUM CHLORIDE 0.9 % IV SOLN: Freq: Once | INTRAVENOUS | Status: AC

## 2019-05-27 MED ORDER — SODIUM CHLORIDE 0.9 % IV SOLN
150.0000 mg | Freq: Once | INTRAVENOUS | Status: AC
  Administered 2019-05-27: 150 mg via INTRAVENOUS
  Filled 2019-05-27: qty 150

## 2019-05-27 MED ORDER — SODIUM CHLORIDE 0.9 % IV SOLN
80.0000 mg/m2 | Freq: Once | INTRAVENOUS | Status: AC
  Administered 2019-05-27: 190 mg via INTRAVENOUS
  Filled 2019-05-27: qty 9.5

## 2019-05-27 MED ORDER — PALONOSETRON HCL INJECTION 0.25 MG/5ML
0.2500 mg | Freq: Once | INTRAVENOUS | Status: AC
  Administered 2019-05-27: 0.25 mg via INTRAVENOUS
  Filled 2019-05-27: qty 5

## 2019-05-27 MED ORDER — SODIUM CHLORIDE 0.9 % IV SOLN
1200.0000 mg | Freq: Once | INTRAVENOUS | Status: AC
  Administered 2019-05-27: 1200 mg via INTRAVENOUS
  Filled 2019-05-27: qty 20

## 2019-05-27 MED ORDER — SODIUM CHLORIDE 0.9 % IV SOLN
500.0000 mg | Freq: Once | INTRAVENOUS | Status: AC
  Administered 2019-05-27: 13:00:00 500 mg via INTRAVENOUS
  Filled 2019-05-27: qty 50

## 2019-05-27 MED ORDER — INSULIN ASPART 100 UNIT/ML ~~LOC~~ SOLN
10.0000 [IU] | Freq: Once | SUBCUTANEOUS | Status: AC
  Administered 2019-05-27: 12:00:00 10 [IU] via SUBCUTANEOUS
  Filled 2019-05-27: qty 0.1

## 2019-05-27 MED ORDER — SODIUM CHLORIDE 0.9 % IV SOLN
10.0000 mg | Freq: Once | INTRAVENOUS | Status: AC
  Administered 2019-05-27: 12:00:00 10 mg via INTRAVENOUS
  Filled 2019-05-27: qty 10

## 2019-05-27 NOTE — Assessment & Plan Note (Signed)
1.  Recurrent small cell lung cancer: -Cycle 1 of carboplatin, VP-16 and atezolizumab on 05/06/2019 with Neulasta. -Hospitalized from 05/15/2019-05/18/2019 with shortness of breath and neutropenia.  He was treated for pneumonia with antibiotics. -MRI of the brain on 05/13/2019 showed small 7 mm rim-enhancing lesion at the junction of the left posterior temporal and anterior occipital lobes compatible with solitary metastasis.  The typical appearance of lacunar infarct is also considered. -I have reviewed his labs today.  They are adequate to proceed with cycle 2 of chemotherapy. -I will cut back on carboplatin to AUC 4 and etoposide to 80 mg per metered square.  He will receive Neulasta on Monday.  I will give him hydration on Monday. -As he is prone to hospitalizations after each treatment, I will follow him closely.  We will reevaluate him in 2 weeks with labs and possible fluids.  I will see him back in 3 weeks for follow-up prior to next cycle.  I plan to repeat scans after cycle 3.  2.  Diabetes: -He is taking Lantus 20 units twice daily.  He uses Humalog sliding scale. -His sugar today is 365.  We will give Humalog 10 units subcu.  3.  Atrial fibrillation: -Continue Eliquis 5 mg twice daily.  No bleeding issues.  4.  Low back pain: -He will continue hydrocodone 5/325 at bedtime as needed.

## 2019-05-27 NOTE — Progress Notes (Signed)
[place], [place] [address]   CLINIC:  Medical Oncology/Hematology  PCP:  [facility], [facility] [place] [id number] [phone]   REASON FOR VISIT:  Extensive small cell lung cancer.  CURRENT THERAPY: Carboplatin, VP-16 and atezolizumab.  BRIEF ONCOLOGIC HISTORY:  Oncology History  Small cell lung cancer ([place])  04/03/2016 Initial Diagnosis   Small cell lung cancer ([place])   04/09/2016 Imaging   MRI brain- Negative for metastatic disease to the brain. No acute abnormality.   04/09/2016 Procedure   Right IJ port catheter placement by IR   04/13/2016 PET scan   . The right apical lung mass has significantly enlarged and there is new right paratracheal adenopathy as well as a hypermetabolic new lymph node in the right inner infraclavicular fossa potentially with early impingement on adjacent neurovascular structures. 2. Other imaging findings of potential clinical significance: Chronic bilateral maxillary sinusitis. Left carotid atherosclerotic calcification. Aortoiliac atherosclerotic vascular disease. Coronary atherosclerosis. Paraseptal emphysema. Mild atelectasis in the lung bases. Scattered sigmoid colon diverticula. Probable right pars defect at L5.   05/09/2016 - 07/12/2016 Radiation Therapy   Radiotherapy to right Lung+MS 63 Gy/35 fractions at 1.8 GY/f using 6 x photons 3- D CRT technique Dates: 4/18-6/21/18       06/19/2016 Treatment Plan Change   Cisplatin dose reduced by 20% for cycle #4 due to severe thrombocytopenia following cycle #3 (13,000).   08/21/2016 PET scan   1. Marked reduction in size and metabolic activity of RIGHT upper lobe mass. 2. Resolution of mediastinal and RIGHT supraclavicular lymphadenopathy. 3. No evidence of disease progression.   05/06/2019 -  Chemotherapy   The patient had palonosetron (ALOXI) injection 0.25 mg, 0.25 mg, Intravenous,  Once, 2 of 4  cycles Administration: 0.25 mg (05/06/2019) pegfilgrastim-cbqv (UDENYCA) injection 6 mg, 6 mg, Subcutaneous, Once, 2 of 4 cycles Administration: 6 mg (05/11/2019) CARBOplatin (PARAPLATIN) 450 mg in sodium chloride 0.9 % 250 mL chemo infusion, 450 mg (100 % of original dose 446.4 mg), Intravenous,  Once, 2 of 4 cycles Dose modification:   (original dose 446.4 mg, Cycle 1, Reason: Provider Judgment),   (original dose 500 mg, Cycle 2, Reason: Provider Judgment), 500 mg (original dose 500 mg, Cycle 2, Reason: Provider Judgment) Administration: 450 mg (05/06/2019), 500 mg (05/27/2019) etoposide (VEPESID) 190 mg in sodium chloride 0.9 % 500 mL chemo infusion, 80 mg/m2 = 190 mg (80 % of original dose 100 mg/m2), Intravenous,  Once, 2 of 4 cycles Dose modification: 80 mg/m2 (80 % of original dose 100 mg/m2, Cycle 1, Reason: Provider Judgment), 80 mg/m2 (original dose 100 mg/m2, Cycle 1, Reason: Provider Judgment), 80 mg/m2 (80 % of original dose 100 mg/m2, Cycle 2, Reason: Provider Judgment) Administration: 190 mg (05/06/2019), 190 mg (05/07/2019), 190 mg (05/08/2019) fosaprepitant (EMEND) 150 mg in sodium chloride 0.9 % 145 mL IVPB, 150 mg, Intravenous,  Once, 2 of 4 cycles Administration: 150 mg (05/06/2019), 150 mg (05/27/2019) atezolizumab (TECENTRIQ) 1,200 mg in sodium chloride 0.9 % 250 mL chemo infusion, 1,200 mg, Intravenous, Once, 2 of 8 cycles Administration: 1,200 mg (05/06/2019), 1,200 mg (05/27/2019)  for chemotherapy treatment.       CANCER STAGING: Cancer Staging Small cell lung cancer ([place]) Staging form: Lung, AJCC 8th Edition - Clinical: Stage IIIC (cT3, cN3, cM0) - Signed by Twana First, MD on 04/16/2016    INTERVAL HISTORY:  Mr. Colledge 69 y.o. male seen for follow-up of extensive stage small cell lung cancer  and toxicity assessment prior to cycle 2 of chemotherapy.  Reports appetite and energy levels of 100%.  Shortness of breath on exertion from COPD is stable.  Numbness in the feet and  hands is also stable.  REVIEW OF SYSTEMS:  Review of Systems  Respiratory: Positive for cough and shortness of breath.   Neurological: Positive for numbness.  All other systems reviewed and are negative.    PAST MEDICAL/SURGICAL HISTORY:  Past Medical History:  Diagnosis Date  . Alcohol abuse    Quit 08/23/11  . Arthritis   . Atrial fibrillation ([place])   . Atrial flutter ([place])    CTI ablation by Dr [name] 10/2011  . Bacteremia due to Gram-positive bacteria 11/29/2016  . CHF (congestive heart failure) ([place])   . COPD (chronic obstructive pulmonary disease) ([place])   . Depression   . Essential hypertension   . History of cardiomyopathy    LVEF 25-30% 08/2011 with subsequent normalization  . History of kidney stones   . Obesity   . Prostate enlargement   . Small cell lung cancer ([place])    Right upper lobe - follows with [facility]  . Type 2 diabetes mellitus ([place])    Past Surgical History:  Procedure Laterality Date  . APPENDECTOMY    . ATRIAL ABLATION SURGERY  11/06/2011   CTI ablation for atrial flutter by Dr [name]  . ATRIAL FLUTTER ABLATION N/A 11/06/2011   Procedure: ATRIAL FLUTTER ABLATION;  Surgeon: Thompson Grayer, MD;  Location: [facility] CATH LAB;  Service: Cardiovascular;  Laterality: N/A;  . Cataracts    . COLONOSCOPY WITH PROPOFOL N/A 06/06/2017   Procedure: COLONOSCOPY WITH PROPOFOL;  Surgeon: [name], MD;  Location: [facility] ENDO SUITE;  Service: Endoscopy;  Laterality: N/A;  1:15pm  . IR GENERIC HISTORICAL  04/09/2016   IR US GUIDE VASC ACCESS RIGHT 04/09/2016 [name], DO [facility]-INTERV RAD  . IR GENERIC HISTORICAL  04/09/2016   IR FLUORO GUIDE PORT INSERTION RIGHT 04/09/2016 [name], DO [facility]-INTERV RAD  . PORT-A-CATH REMOVAL N/A 04/24/2017   Procedure: MINOR REMOVAL PORT-A-CATH;  Surgeon: [name], MD;  Location: [facility] ORS;  Service: General;  Laterality: N/A;  . PORTACATH PLACEMENT Left 04/22/2019   Procedure: INSERTION PORT-A-CATH;  Surgeon: Aviva Signs,  MD;  Location: [facility] ORS;  Service: General;  Laterality: Left;  . TONSILLECTOMY       SOCIAL HISTORY:  Social History   Socioeconomic History  . Marital status: Single    Spouse name: Not on file  . Number of children: Not on file  . Years of education: Not on file  . Highest education level: Not on file  Occupational History  . Occupation: retired     Comment: Building services engineer  Tobacco Use  . Smoking status: Former Smoker    Packs/day: 0.50    Years: 47.00    Pack years: 23.50    Types: Cigarettes    Quit date: 05/30/2012    Years since quitting: 6.9  . Smokeless tobacco: Former Systems developer    Quit date: 04/10/2014  . Tobacco comment: encouraged to quit today 03/04/12  Substance and Sexual Activity  . Alcohol use: No    Comment: quit 3 1/2 years ago  . Drug use: Not Currently    Comment: marajuana occasionally  . Sexual activity: Not Currently  Other Topics Concern  . Not on file  Social History Narrative   Lives in [place] alone.   Disabled due to COPD   Previousliy worked  as a Electrical engineer   Social Determinants of Radio broadcast assistant Strain:   . Difficulty of Paying Living Expenses:   Food Insecurity:   . Worried About Charity fundraiser in the Last Year:   . Arboriculturist in the Last Year:   Transportation Needs:   . Film/video editor (Medical):   Marland Kitchen Lack of Transportation (Non-Medical):   Physical Activity:   . Days of Exercise per Week:   . Minutes of Exercise per Session:   Stress:   . Feeling of Stress :   Social Connections:   . Frequency of Communication with Friends and Family:   . Frequency of Social Gatherings with Friends and Family:   . Attends Religious Services:   . Active Member of Clubs or Organizations:   . Attends Archivist Meetings:   Marland Kitchen Marital Status:   Intimate Partner Violence:   . Fear of Current or Ex-Partner:   . Emotionally Abused:   Marland Kitchen Physically Abused:   . Sexually Abused:     FAMILY HISTORY:   Family History  Problem Relation Age of Onset  . Diabetes Sister   . Hypertension Mother   . Lung cancer Mother   . Hypertension Father   . Heart attack Father   . Hypertension Sister   . Hypertension Sister   . Hypertension Sister   . Pulmonary fibrosis Sister   . Breast cancer Cousin   . Colon cancer Neg Hx     CURRENT MEDICATIONS:  Outpatient Encounter Medications as of 05/27/2019  Medication Sig  . ACCU-CHEK AVIVA PLUS test strip check blood sugar TWICE DAILY  . apixaban (ELIQUIS) 5 MG TABS tablet Take 5 mg by mouth 2 (two) times daily.   Marland Kitchen AQUALANCE LANCETS 30G MISC USE TO check blood glucose twice daily  . Artificial Saliva (BIOTENE DRY MOUTH MOISTURIZING) SOLN Apply 1 Dose topically as needed (dry mouth).   Marland Kitchen atorvastatin (LIPITOR) 20 MG tablet Take 1 tablet (20 mg total) by mouth daily.  Marland Kitchen diltiazem (DILACOR XR) 240 MG 24 hr capsule Take 240 mg by mouth daily.  Marland Kitchen dofetilide (TIKOSYN) 250 MCG capsule Take 1 capsule (250 mcg total) by mouth 2 (two) times daily.  Marland Kitchen gabapentin (NEURONTIN) 300 MG capsule Take 600 mg by mouth 2 (two) times daily.   . hydrOXYzine (VISTARIL) 25 MG capsule Take 25 mg by mouth every 8 (eight) hours.  . insulin lispro (HUMALOG) 100 UNIT/ML injection Inject 2-20 Units into the skin 3 (three) times daily with meals. Sliding scale   . Lancet Devices (ADJUSTABLE LANCING DEVICE) MISC TO check blood glucose daily  . LANTUS SOLOSTAR 100 UNIT/ML Solostar Pen INJECT 20 UNITS UNDER THE SKIN EVERY MORNING AND INJECT 20 UNITS UNDER THE SKIN EVERY EVENING (Patient taking differently: Inject 20 Units into the skin 2 (two) times daily. )  . levalbuterol (XOPENEX) 0.63 MG/3ML nebulizer solution Inhale 3 mLs (0.63 mg total) into the lungs 4 (four) times daily.  . Lidocaine (SALONPAS PAIN RELIEVING EX) Apply 1 patch topically daily.   . metFORMIN (GLUCOPHAGE) 500 MG tablet Take 500 mg by mouth in the morning and at bedtime.  . metoprolol succinate (TOPROL-XL) 100 MG 24  hr tablet Take 100 mg by mouth daily.  . Multiple Vitamin (MULTIVITAMIN WITH MINERALS) TABS tablet Take 1 tablet by mouth daily.  . potassium chloride (KLOR-CON) 10 MEQ tablet Take 10 mEq by mouth 2 (two) times daily.  . pregabalin (LYRICA) 150 MG  capsule Take 150 mg by mouth in the morning and at bedtime.  . senna-docusate (SENOKOT-S) 8.6-50 MG tablet Take 2 tablets by mouth at bedtime.  Marland Kitchen spironolactone (ALDACTONE) 25 MG tablet Take 12.5 mg by mouth 2 (two) times daily.   . tamsulosin (FLOMAX) 0.4 MG CAPS capsule Take 1 capsule (0.4 mg total) by mouth 2 (two) times daily after a meal.  . tiotropium (SPIRIVA HANDIHALER) 18 MCG inhalation capsule Place 1 capsule (18 mcg total) into inhaler and inhale daily.  . traZODone (DESYREL) 50 MG tablet Take 50 mg by mouth at bedtime.  Marland Kitchen acetaminophen (TYLENOL) 500 MG tablet Take 1,000 mg by mouth every 6 (six) hours as needed for moderate pain or headache.  . benzonatate (TESSALON) 200 MG capsule Take 1 capsule (200 mg total) by mouth 3 (three) times daily as needed for cough. (Patient not taking: Reported on 05/21/2019)  . Carboxymethylcellul-Glycerin (LUBRICATING EYE DROPS OP) Place 1 drop into the left eye daily as needed (dry eyes).  . fluticasone (FLONASE) 50 MCG/ACT nasal spray Place 2 sprays into both nostrils daily. (Patient not taking: Reported on 05/27/2019)  . Fluticasone-Umeclidin-Vilant (TRELEGY ELLIPTA) 100-62.5-25 MCG/INH AEPB Inhale 1 puff into the lungs daily.  . furosemide (LASIX) 40 MG tablet TAKE 1 TABLET BY MOUTH EVERY DAY (Patient taking differently: Take 40 mg by mouth daily. )  . guaifenesin (ROBITUSSIN) 100 MG/5ML syrup Take 200 mg by mouth at bedtime as needed for cough.  Marland Kitchen HYDROcodone-acetaminophen (NORCO/VICODIN) 5-325 MG tablet Take 1 tablet by mouth at bedtime as needed. (Patient not taking: Reported on 05/27/2019)  . ipratropium-albuterol (DUONEB) 0.5-2.5 (3) MG/3ML SOLN Take 3 mLs by nebulization every 4 (four) hours as needed  (shortness of breath).   . lidocaine-prilocaine (EMLA) cream Apply a small amount to port a cath site and cover with plastic wrap 1 hour prior to chemotherapy appointments (Patient not taking: Reported on 05/21/2019)  . PROAIR HFA 108 (90 Base) MCG/ACT inhaler INHALE TWO PUFFS BY MOUTH EVERY 4 HOURS AS NEEDED FOR WHEEZING (Patient not taking: No sig reported)  . prochlorperazine (COMPAZINE) 10 MG tablet Take 1 tablet (10 mg total) by mouth every 6 (six) hours as needed (Nausea or vomiting). (Patient not taking: Reported on 05/21/2019)   No facility-administered encounter medications on file as of 05/27/2019.    ALLERGIES:  No Known Allergies   PHYSICAL EXAM:  ECOG Performance status: 1  Vitals:   05/27/19 1058  BP: (!) 100/55  Pulse: 73  Resp: 17  Temp: (!) 96.9 F (36.1 C)  SpO2: 93%   Filed Weights   05/27/19 1059  Weight: 248 lb 12.8 oz (112.9 kg)    Physical Exam Vitals reviewed.  Constitutional:      Appearance: Normal appearance.  Cardiovascular:     Rate and Rhythm: Normal rate and regular rhythm.     Heart sounds: Normal heart sounds.  Pulmonary:     Effort: Pulmonary effort is normal.     Comments: Crepitations in the left lower base heard. Abdominal:     General: There is no distension.     Palpations: Abdomen is soft. There is no mass.  Lymphadenopathy:     Cervical: No cervical adenopathy.  Skin:    General: Skin is warm.  Neurological:     General: No focal deficit present.     Mental Status: He is alert and oriented to person, place, and time.  Psychiatric:        Mood and Affect: Mood normal.  Behavior: Behavior normal.      LABORATORY DATA:  I have reviewed the labs as listed.  CBC    Component Value Date/Time   WBC 5.5 05/27/2019 0930   RBC 3.85 (L) 05/27/2019 0930   HGB 12.1 (L) 05/27/2019 0930   HCT 38.4 (L) 05/27/2019 0930   PLT 218 05/27/2019 0930   MCV 99.7 05/27/2019 0930   MCH 31.4 05/27/2019 0930   MCHC 31.5 05/27/2019  0930   RDW 15.3 05/27/2019 0930   LYMPHSABS 0.5 (L) 05/27/2019 0930   MONOABS 0.8 05/27/2019 0930   EOSABS 0.0 05/27/2019 0930   BASOSABS 0.1 05/27/2019 0930   CMP Latest Ref Rng & Units 05/27/2019 05/21/2019 05/18/2019  Glucose 70 - 99 mg/dL 365(H) 311(H) 206(H)  BUN 8 - 23 mg/dL 20 8 12   Creatinine 0.61 - 1.24 mg/dL 1.04 1.02 0.99  Sodium 135 - 145 mmol/L 133(L) 138 137  Potassium 3.5 - 5.1 mmol/L 4.3 4.3 4.0  Chloride 98 - 111 mmol/L 96(L) 97(L) 104  CO2 22 - 32 mmol/L 28 31 25   Calcium 8.9 - 10.3 mg/dL 9.1 8.8(L) 8.4(L)  Total Protein 6.5 - 8.1 g/dL 6.8 6.4(L) -  Total Bilirubin 0.3 - 1.2 mg/dL 0.4 0.3 -  Alkaline Phos 38 - 126 U/L 65 64 -  AST 15 - 41 U/L 27 24 -  ALT 0 - 44 U/L 43 43 -       DIAGNOSTIC IMAGING:  I have reviewed scans.     ASSESSMENT & PLAN:   Small cell lung cancer ([place]) 1.  Recurrent small cell lung cancer: -Cycle 1 of carboplatin, VP-16 and atezolizumab on 05/06/2019 with Neulasta. -Hospitalized from 05/15/2019-05/18/2019 with shortness of breath and neutropenia.  He was treated for pneumonia with antibiotics. -MRI of the brain on 05/13/2019 showed small 7 mm rim-enhancing lesion at the junction of the left posterior temporal and anterior occipital lobes compatible with solitary metastasis.  The typical appearance of lacunar infarct is also considered. -I have reviewed his labs today.  They are adequate to proceed with cycle 2 of chemotherapy. -I will cut back on carboplatin to AUC 4 and etoposide to 80 mg per metered square.  He will receive Neulasta on Monday.  I will give him hydration on Monday. -As he is prone to hospitalizations after each treatment, I will follow him closely.  We will reevaluate him in 2 weeks with labs and possible fluids.  I will see him back in 3 weeks for follow-up prior to next cycle.  I plan to repeat scans after cycle 3.  2.  Diabetes: -He is taking Lantus 20 units twice daily.  He uses Humalog sliding scale. -His sugar  today is 365.  We will give Humalog 10 units subcu.  3.  Atrial fibrillation: -Continue Eliquis 5 mg twice daily.  No bleeding issues.  4.  Low back pain: -He will continue hydrocodone 5/325 at bedtime as needed.      Orders placed this encounter:  No orders of the defined types were placed in this encounter.    Derek Jack, MD [place] [phone]

## 2019-05-27 NOTE — Patient Instructions (Addendum)
[place] at [facility] Discharge Instructions  You were seen today by Dr. Delton Coombes. He went over your recent lab results. He will schedule you for fluids and injection on Monday.  He will see you back in 3 weeks for labs and follow up.   Thank you for choosing [place] at [facility] to provide your oncology and hematology care.  To afford each patient quality time with our provider, please arrive at least 15 minutes before your scheduled appointment time.   If you have a lab appointment with the [facility] please come in thru the  Main Entrance and check in at the main information desk  You need to re-schedule your appointment should you arrive 10 or more minutes late.  We strive to give you quality time with our providers, and arriving late affects you and other patients whose appointments are after yours.  Also, if you no show three or more times for appointments you may be dismissed from the clinic at the providers discretion.     Again, thank you for choosing [facility].  Our hope is that these requests will decrease the amount of time that you wait before being seen by our physicians.       _____________________________________________________________  Should you have questions after your visit to [facility], please contact our office at [phone] between the hours of 8:00 a.m. and 4:30 p.m.  Voicemails left after 4:00 p.m. will not be returned until the following business day.  For prescription refill requests, have your pharmacy contact our office and allow 72 hours.    Cancer Center Support Programs:   > Cancer Support Group  2nd Tuesday of the month 1pm-2pm, Journey Room

## 2019-05-27 NOTE — Patient Instructions (Signed)
[facility] at [facility] Discharge Instructions  Labs drawn from portacath today   Thank you for choosing [facility] at [facility] to provide your oncology and hematology care.  To afford each patient quality time with our provider, please arrive at least 15 minutes before your scheduled appointment time.   If you have a lab appointment with the Cancer Center please come in thru the Main Entrance and check in at the main information desk.  You need to re-schedule your appointment should you arrive 10 or more minutes late.  We strive to give you quality time with our providers, and arriving late affects you and other patients whose appointments are after yours.  Also, if you no show three or more times for appointments you may be dismissed from the clinic at the providers discretion.     Again, thank you for choosing [facility].  Our hope is that these requests will decrease the amount of time that you wait before being seen by our physicians.       _____________________________________________________________  Should you have questions after your visit to [facility], please contact our office at [phone] between the hours of 8:00 a.m. and 4:30 p.m.  Voicemails left after 4:00 p.m. will not be returned until the following business day.  For prescription refill requests, have your pharmacy contact our office and allow 72 hours.    Due to Covid, you will need to wear a mask upon entering the hospital. If you do not have a mask, a mask will be given to you at the Main Entrance upon arrival. For doctor visits, patients may have 1 support person with them. For treatment visits, patients can not have anyone with them due to social distancing guidelines and our immunocompromised population.     

## 2019-05-27 NOTE — Progress Notes (Signed)
05/27/19  Confirmed ok to increase carboplatin to 500 mg with scr 1.04 today but do not go higher per MD.  T.O. Dr Rhys Martini, PharmD

## 2019-05-27 NOTE — Progress Notes (Signed)
Tolerated infusions w/o adverse reaction.  Alert, in no distress.  VSS.  Discharged via wheelchair.

## 2019-05-27 NOTE — Progress Notes (Signed)
Patient has been assessed, vital signs and labs have been reviewed by Dr. Delton Coombes. ANC, Creatinine, LFTs, and Platelets are within treatment parameters per Dr. Delton Coombes. The patient is good to proceed with treatment at this time. Continue treatment at the current dose, please give 10 units of Homalog for elevated blood sugar SQ per Dr. Delton Coombes.

## 2019-05-28 ENCOUNTER — Inpatient Hospital Stay (HOSPITAL_COMMUNITY): Payer: Medicare Other

## 2019-05-28 VITALS — BP 127/59 | HR 64 | Temp 97.1°F | Resp 18

## 2019-05-28 DIAGNOSIS — C3491 Malignant neoplasm of unspecified part of right bronchus or lung: Secondary | ICD-10-CM

## 2019-05-28 DIAGNOSIS — Z5112 Encounter for antineoplastic immunotherapy: Secondary | ICD-10-CM | POA: Diagnosis not present

## 2019-05-28 LAB — TSH: TSH: 0.841 u[IU]/mL (ref 0.350–4.500)

## 2019-05-28 MED ORDER — SODIUM CHLORIDE 0.9% FLUSH: 10.0000 mL | INTRAVENOUS | Status: DC | PRN

## 2019-05-28 MED ORDER — SODIUM CHLORIDE 0.9 % IV SOLN: Freq: Once | INTRAVENOUS | Status: AC

## 2019-05-28 MED ORDER — SODIUM CHLORIDE 0.9 % IV SOLN
80.0000 mg/m2 | Freq: Once | INTRAVENOUS | Status: AC
  Administered 2019-05-28: 190 mg via INTRAVENOUS
  Filled 2019-05-28: qty 9.5

## 2019-05-28 MED ORDER — HEPARIN SOD (PORK) LOCK FLUSH 100 UNIT/ML IV SOLN
500.0000 [IU] | Freq: Once | INTRAVENOUS | Status: AC | PRN
  Administered 2019-05-28: 500 [IU]

## 2019-05-28 MED ORDER — SODIUM CHLORIDE 0.9 % IV SOLN
10.0000 mg | Freq: Once | INTRAVENOUS | Status: AC
  Administered 2019-05-28: 10 mg via INTRAVENOUS
  Filled 2019-05-28: qty 10

## 2019-05-28 NOTE — Progress Notes (Signed)
Patient presents today for Day 2 VP-16. Vital signs stable. Patient has no complaints of any changes since his last visit. MAR reviewed.  Treatment given today per MD orders. Tolerated infusion without adverse affects. Vital signs stable. No complaints at this time.   11:15am Harrie Jeans CNA called out for assistance. Upon entering room patient sitting in the floor. Patient states he has no pain. No visible injuries noted. Patient sitting on buttocks. Patient denies any dizziness, pain, or injury.   Discharged from clinic via wheel chair.  F/U with [facility] as scheduled.   14:50 Spoke with patient pertaining to today's fall. Patient states he felt fine and has no pain from the fall. Patient then states his Home Health Nurse came by and his right leg has been sore x 3 days behind the knee and he requested her to look at it. Patient states the home health nurse  instructed the patient to go the the ER due to the back of the patient's right leg was red and swollen and the nurse thought he may have a blood clot per patient's words.

## 2019-05-28 NOTE — Patient Instructions (Signed)
[facility] Discharge Instructions for Patients Receiving Chemotherapy  Today you received the following chemotherapy agents   To help prevent nausea and vomiting after your treatment, we encourage you to take your nausea medication   If you develop nausea and vomiting that is not controlled by your nausea medication, call the clinic.   BELOW ARE SYMPTOMS THAT SHOULD BE REPORTED IMMEDIATELY:  *FEVER GREATER THAN 100.5 F  *CHILLS WITH OR WITHOUT FEVER  NAUSEA AND VOMITING THAT IS NOT CONTROLLED WITH YOUR NAUSEA MEDICATION  *UNUSUAL SHORTNESS OF BREATH  *UNUSUAL BRUISING OR BLEEDING  TENDERNESS IN MOUTH AND THROAT WITH OR WITHOUT PRESENCE OF ULCERS  *URINARY PROBLEMS  *BOWEL PROBLEMS  UNUSUAL RASH Items with * indicate a potential emergency and should be followed up as soon as possible.  Feel free to call the clinic should you have any questions or concerns. The clinic phone number is [phone].  Please show the CHEMO ALERT CARD at check-in to the Emergency Department and triage nurse.   

## 2019-05-28 NOTE — Progress Notes (Addendum)
Entered in error

## 2019-05-28 NOTE — Progress Notes (Signed)
   05/28/19 1115  What Happened  Was fall witnessed? Yes  Who witnessed fall?  Harrie Jeans CNA)  Patients activity before fall other (comment) (From chemotherapy chair to wheel chair. )  Point of contact buttocks (assisted fall to floor on buttocks. )  Was patient injured? No  Patient found on floor  Found by Staff-comment Harrie Jeans )  Follow Up  MD notified Dr. Delton Coombes  Time MD notified [phone]  Additional tests No  Progress note created (see row info) Yes  Adult Fall Risk Assessment  Risk Factor Category (scoring not indicated) High fall risk per protocol (document High fall risk)  Patient Fall Risk Level High fall risk  Adult Fall Risk Interventions  Required Bundle Interventions *See Row Information* High fall risk - low, moderate, and high requirements implemented  Additional Interventions Other (Comment)  Screening for Fall Injury Risk (To be completed on HIGH fall risk patients who do not meet crieteria for Low Bed) - Assessing Need for Floor Mats Only  Risk For Fall Injury- Criteria for Floor Mats Bleeding risk-anticoagulation (not prophylaxis)  Pain Assessment  Pain Score 0  PCA/Epidural/Spinal Assessment  Respiratory Pattern Regular  Neurological  Neuro (WDL) WDL  Level of Consciousness Alert  Glasgow Coma Scale  Eye Opening 4  Best Verbal Response (NON-intubated) 5  Best Motor Response 6  Glasgow Coma Scale Score 15  Musculoskeletal  Musculoskeletal (WDL) WDL  Assistive Device Wheelchair  Generalized Weakness Yes  Weight Bearing Restrictions No

## 2019-05-28 NOTE — Progress Notes (Signed)
   05/28/19 1115  What Happened  Was fall witnessed? Yes  Who witnessed fall?  Harrie Jeans CNA)  Patients activity before fall other (comment) (From chemotherapy chair to wheel chair. )  Point of contact buttocks (assisted fall to floor on buttocks. )  Was patient injured? No  Patient found on floor  Found by Staff-comment Harrie Jeans )  Follow Up  MD notified Dr. Delton Coombes  Time MD notified [phone]  Additional tests No  Progress note created (see row info) Yes  Adult Fall Risk Assessment  Risk Factor Category (scoring not indicated) High fall risk per protocol (document High fall risk)  Patient Fall Risk Level High fall risk  Adult Fall Risk Interventions  Required Bundle Interventions *See Row Information* High fall risk - low, moderate, and high requirements implemented  Screening for Fall Injury Risk (To be completed on HIGH fall risk patients who do not meet crieteria for Low Bed) - Assessing Need for Floor Mats Only  Risk For Fall Injury- Criteria for Floor Mats Bleeding risk-anticoagulation (not prophylaxis)  Pain Assessment  Pain Score 0  PCA/Epidural/Spinal Assessment  Respiratory Pattern Regular  Neurological  Neuro (WDL) WDL  Level of Consciousness Alert  Glasgow Coma Scale  Eye Opening 4  Best Verbal Response (NON-intubated) 5  Best Motor Response 6  Glasgow Coma Scale Score 15  Musculoskeletal  Musculoskeletal (WDL) WDL  Assistive Device Wheelchair  Generalized Weakness Yes  Weight Bearing Restrictions No

## 2019-05-29 ENCOUNTER — Inpatient Hospital Stay (HOSPITAL_COMMUNITY): Payer: Medicare Other

## 2019-05-29 ENCOUNTER — Other Ambulatory Visit: Payer: Self-pay

## 2019-05-29 VITALS — BP 119/51 | HR 68 | Temp 97.1°F | Resp 18

## 2019-05-29 DIAGNOSIS — C3491 Malignant neoplasm of unspecified part of right bronchus or lung: Secondary | ICD-10-CM

## 2019-05-29 DIAGNOSIS — Z5112 Encounter for antineoplastic immunotherapy: Secondary | ICD-10-CM | POA: Diagnosis not present

## 2019-05-29 MED ORDER — HEPARIN SOD (PORK) LOCK FLUSH 100 UNIT/ML IV SOLN
500.0000 [IU] | Freq: Once | INTRAVENOUS | Status: AC | PRN
  Administered 2019-05-29: 500 [IU]

## 2019-05-29 MED ORDER — SODIUM CHLORIDE 0.9 % IV SOLN
10.0000 mg | Freq: Once | INTRAVENOUS | Status: AC
  Administered 2019-05-29: 10 mg via INTRAVENOUS
  Filled 2019-05-29: qty 10

## 2019-05-29 MED ORDER — SODIUM CHLORIDE 0.9 % IV SOLN: Freq: Once | INTRAVENOUS | Status: AC

## 2019-05-29 MED ORDER — SODIUM CHLORIDE 0.9% FLUSH
10.0000 mL | INTRAVENOUS | Status: DC | PRN
  Administered 2019-05-29: 10 mL

## 2019-05-29 MED ORDER — SODIUM CHLORIDE 0.9 % IV SOLN
80.0000 mg/m2 | Freq: Once | INTRAVENOUS | Status: AC
  Administered 2019-05-29: 190 mg via INTRAVENOUS
  Filled 2019-05-29: qty 9.5

## 2019-05-29 NOTE — Progress Notes (Signed)
Patient to treatment area for day 3 [place].  Patient stated he went to [facility] emergency room due to the advice of his home health care nurse.  The patient stated his right lower calf was "hard and tender" and was advised to go to the emergency room.  Patient stated the right lower calf was not warm to touch or reddened only "hard and tender" to touch.  Patient stated [facility] performed an ultrasound that came back negative per the physician and was discharged.  Patients buttocks checked today due to being found sitting on the floor after yesterdays treatment.  Skin clean and dry with no bruising or abrasions noted.  Patient denied pain and denied pain with palpation.  The patient also denied pain with ambulation.    Patients right calf palpated with no hardened areas or warmth noted with touch.  Patient stated only tenderness with touch.  No s/s of distress noted.   Patient tolerated chemotherapy with no complaints voiced.  Side effects with management reviewed with understanding verbalized.  Port site clean and dry with no bruising or swelling noted at site.  Good blood return noted before and after administration of chemotherapy.  Band aid applied.  Patient denied buttocks and leg pain.  Patient denied pain with ambulation. Patient left by wheelchair with VSS and no s/s of distress noted.

## 2019-06-01 ENCOUNTER — Encounter (HOSPITAL_COMMUNITY): Payer: Self-pay | Admitting: *Deleted

## 2019-06-01 ENCOUNTER — Other Ambulatory Visit: Payer: Self-pay

## 2019-06-01 ENCOUNTER — Ambulatory Visit (HOSPITAL_COMMUNITY): Payer: Medicare Other

## 2019-06-01 ENCOUNTER — Inpatient Hospital Stay (HOSPITAL_COMMUNITY): Payer: Medicare Other

## 2019-06-01 VITALS — BP 119/57 | HR 74 | Temp 97.7°F | Resp 18

## 2019-06-01 DIAGNOSIS — Z5112 Encounter for antineoplastic immunotherapy: Secondary | ICD-10-CM | POA: Diagnosis not present

## 2019-06-01 DIAGNOSIS — C3491 Malignant neoplasm of unspecified part of right bronchus or lung: Secondary | ICD-10-CM

## 2019-06-01 MED ORDER — PEGFILGRASTIM-CBQV 6 MG/0.6ML ~~LOC~~ SOSY
6.0000 mg | PREFILLED_SYRINGE | Freq: Once | SUBCUTANEOUS | Status: AC
  Administered 2019-06-01: 6 mg via SUBCUTANEOUS
  Filled 2019-06-01: qty 0.6

## 2019-06-01 MED ORDER — HEPARIN SOD (PORK) LOCK FLUSH 100 UNIT/ML IV SOLN
500.0000 [IU] | Freq: Once | INTRAVENOUS | Status: AC | PRN
  Administered 2019-06-01: 500 [IU]

## 2019-06-01 MED ORDER — SODIUM CHLORIDE 0.9 % IV SOLN
Freq: Once | INTRAVENOUS | Status: AC
  Filled 2019-06-01: qty 1000

## 2019-06-01 MED ORDER — SODIUM CHLORIDE 0.9% FLUSH
10.0000 mL | Freq: Once | INTRAVENOUS | Status: AC | PRN
  Administered 2019-06-01: 10 mL

## 2019-06-01 NOTE — Progress Notes (Signed)
Patient presents today for udenyca injection and hydration fluids. Patient did fall over the weekend at home and had to call EMS to get him up. Patient does have some bruising on his right posterior knee area going up towards the back of the leg.   Treatment given per orders. Patient tolerated it well without problems. Vitals stable and discharged home from clinic via wheelchair. Follow up as scheduled.

## 2019-06-01 NOTE — Progress Notes (Signed)
Amy, physical therapist with [facility] called today wanting orders to send out a social worker to the home of patient.  He has requested help in the home and a Education officer, museum with their team is available to go out an assess his needs.  I have given her a verbal order to allow social worker visit per Dr. Delton Coombes.

## 2019-06-01 NOTE — Patient Instructions (Signed)
[facility] Discharge Instructions for Patients Receiving Chemotherapy  Today you received the following chemotherapy agents   To help prevent nausea and vomiting after your treatment, we encourage you to take your nausea medication   If you develop nausea and vomiting that is not controlled by your nausea medication, call the clinic.   BELOW ARE SYMPTOMS THAT SHOULD BE REPORTED IMMEDIATELY:  *FEVER GREATER THAN 100.5 F  *CHILLS WITH OR WITHOUT FEVER  NAUSEA AND VOMITING THAT IS NOT CONTROLLED WITH YOUR NAUSEA MEDICATION  *UNUSUAL SHORTNESS OF BREATH  *UNUSUAL BRUISING OR BLEEDING  TENDERNESS IN MOUTH AND THROAT WITH OR WITHOUT PRESENCE OF ULCERS  *URINARY PROBLEMS  *BOWEL PROBLEMS  UNUSUAL RASH Items with * indicate a potential emergency and should be followed up as soon as possible.  Feel free to call the clinic should you have any questions or concerns. The clinic phone number is [phone].  Please show the CHEMO ALERT CARD at check-in to the Emergency Department and triage nurse.   

## 2019-06-10 ENCOUNTER — Inpatient Hospital Stay (HOSPITAL_BASED_OUTPATIENT_CLINIC_OR_DEPARTMENT_OTHER): Payer: Medicare Other | Admitting: Nurse Practitioner

## 2019-06-10 ENCOUNTER — Inpatient Hospital Stay (HOSPITAL_COMMUNITY): Payer: Medicare Other

## 2019-06-10 ENCOUNTER — Other Ambulatory Visit: Payer: Self-pay

## 2019-06-10 DIAGNOSIS — Z5112 Encounter for antineoplastic immunotherapy: Secondary | ICD-10-CM | POA: Diagnosis not present

## 2019-06-10 DIAGNOSIS — R739 Hyperglycemia, unspecified: Secondary | ICD-10-CM

## 2019-06-10 DIAGNOSIS — C3490 Malignant neoplasm of unspecified part of unspecified bronchus or lung: Secondary | ICD-10-CM

## 2019-06-10 DIAGNOSIS — C3491 Malignant neoplasm of unspecified part of right bronchus or lung: Secondary | ICD-10-CM

## 2019-06-10 LAB — COMPREHENSIVE METABOLIC PANEL
ALT: 30 U/L (ref 0–44)
AST: 18 U/L (ref 15–41)
Albumin: 3.5 g/dL (ref 3.5–5.0)
Alkaline Phosphatase: 83 U/L (ref 38–126)
Anion gap: 15 (ref 5–15)
BUN: 25 mg/dL — ABNORMAL HIGH (ref 8–23)
CO2: 26 mmol/L (ref 22–32)
Calcium: 9.3 mg/dL (ref 8.9–10.3)
Chloride: 88 mmol/L — ABNORMAL LOW (ref 98–111)
Creatinine, Ser: 1.6 mg/dL — ABNORMAL HIGH (ref 0.61–1.24)
GFR calc Af Amer: 51 mL/min — ABNORMAL LOW (ref >60)
GFR calc non Af Amer: 44 mL/min — ABNORMAL LOW (ref >60)
Glucose, Bld: 558 mg/dL (ref 70–99)
Potassium: 5.2 mmol/L — ABNORMAL HIGH (ref 3.5–5.1)
Sodium: 129 mmol/L — ABNORMAL LOW (ref 135–145)
Total Bilirubin: 0.6 mg/dL (ref 0.3–1.2)
Total Protein: 6.6 g/dL (ref 6.5–8.1)

## 2019-06-10 LAB — CBC WITH DIFFERENTIAL/PLATELET
Abs Immature Granulocytes: 0.54 10*3/uL — ABNORMAL HIGH (ref 0.00–0.07)
Basophils Absolute: 0.1 10*3/uL (ref 0.0–0.1)
Basophils Relative: 1 %
Eosinophils Absolute: 0 10*3/uL (ref 0.0–0.5)
Eosinophils Relative: 0 %
HCT: 32.6 % — ABNORMAL LOW (ref 39.0–52.0)
Hemoglobin: 10.2 g/dL — ABNORMAL LOW (ref 13.0–17.0)
Immature Granulocytes: 5 %
Lymphocytes Relative: 4 %
Lymphs Abs: 0.5 10*3/uL — ABNORMAL LOW (ref 0.7–4.0)
MCH: 32.1 pg (ref 26.0–34.0)
MCHC: 31.3 g/dL (ref 30.0–36.0)
MCV: 102.5 fL — ABNORMAL HIGH (ref 80.0–100.0)
Monocytes Absolute: 0.9 10*3/uL (ref 0.1–1.0)
Monocytes Relative: 8 %
Neutro Abs: 9 10*3/uL — ABNORMAL HIGH (ref 1.7–7.7)
Neutrophils Relative %: 82 %
Platelets: 112 10*3/uL — ABNORMAL LOW (ref 150–400)
RBC: 3.18 MIL/uL — ABNORMAL LOW (ref 4.22–5.81)
RDW: 16.7 % — ABNORMAL HIGH (ref 11.5–15.5)
WBC: 10.9 10*3/uL — ABNORMAL HIGH (ref 4.0–10.5)
nRBC: 0.3 % — ABNORMAL HIGH (ref 0.0–0.2)

## 2019-06-10 MED ORDER — SODIUM CHLORIDE 0.9% FLUSH
10.0000 mL | Freq: Once | INTRAVENOUS | Status: AC
  Administered 2019-06-10: 10 mL

## 2019-06-10 MED ORDER — SODIUM CHLORIDE 0.9 % IV SOLN: INTRAVENOUS | Status: DC

## 2019-06-10 MED ORDER — INSULIN ASPART 100 UNIT/ML ~~LOC~~ SOLN
10.0000 [IU] | Freq: Once | SUBCUTANEOUS | Status: AC
  Administered 2019-06-10: 10 [IU] via SUBCUTANEOUS
  Filled 2019-06-10: qty 0.1

## 2019-06-10 MED ORDER — HEPARIN SOD (PORK) LOCK FLUSH 100 UNIT/ML IV SOLN
500.0000 [IU] | Freq: Once | INTRAVENOUS | Status: AC
  Administered 2019-06-10: 500 [IU] via INTRAVENOUS

## 2019-06-10 NOTE — Addendum Note (Signed)
Addended by: Henreitta Leber E on: 06/10/2019 01:39 PM   Modules accepted: Orders

## 2019-06-10 NOTE — Patient Instructions (Signed)
[facility] at [facility] Discharge Instructions  Labs drawn from portacath today   Thank you for choosing [facility] at [facility] to provide your oncology and hematology care.  To afford each patient quality time with our provider, please arrive at least 15 minutes before your scheduled appointment time.   If you have a lab appointment with the Cancer Center please come in thru the Main Entrance and check in at the main information desk.  You need to re-schedule your appointment should you arrive 10 or more minutes late.  We strive to give you quality time with our providers, and arriving late affects you and other patients whose appointments are after yours.  Also, if you no show three or more times for appointments you may be dismissed from the clinic at the providers discretion.     Again, thank you for choosing [facility].  Our hope is that these requests will decrease the amount of time that you wait before being seen by our physicians.       _____________________________________________________________  Should you have questions after your visit to [facility], please contact our office at [phone] between the hours of 8:00 a.m. and 4:30 p.m.  Voicemails left after 4:00 p.m. will not be returned until the following business day.  For prescription refill requests, have your pharmacy contact our office and allow 72 hours.    Due to Covid, you will need to wear a mask upon entering the hospital. If you do not have a mask, a mask will be given to you at the Main Entrance upon arrival. For doctor visits, patients may have 1 support person with them. For treatment visits, patients can not have anyone with them due to social distancing guidelines and our immunocompromised population.     

## 2019-06-10 NOTE — Patient Instructions (Signed)
[place] at [facility] Discharge Instructions  Follow up in 1 week with labs and treatment   Thank you for choosing [place] at [facility] to provide your oncology and hematology care.  To afford each patient quality time with our provider, please arrive at least 15 minutes before your scheduled appointment time.   If you have a lab appointment with the [place] please come in thru the Main Entrance and check in at the main information desk.  You need to re-schedule your appointment should you arrive 10 or more minutes late.  We strive to give you quality time with our providers, and arriving late affects you and other patients whose appointments are after yours.  Also, if you no show three or more times for appointments you may be dismissed from the clinic at the providers discretion.     Again, thank you for choosing [facility].  Our hope is that these requests will decrease the amount of time that you wait before being seen by our physicians.       _____________________________________________________________  Should you have questions after your visit to [facility], please contact our office at [phone] between the hours of 8:00 a.m. and 4:30 p.m.  Voicemails left after 4:00 p.m. will not be returned until the following business day.  For prescription refill requests, have your pharmacy contact our office and allow 72 hours.    Due to Covid, you will need to wear a mask upon entering the hospital. If you do not have a mask, a mask will be given to you at the Main Entrance upon arrival. For doctor visits, patients may have 1 support person with them. For treatment visits, patients can not have anyone with them due to social distancing guidelines and our immunocompromised population.

## 2019-06-10 NOTE — Progress Notes (Signed)
Patient presents today for labs and possible fluids. Vital signs are stable. MAR reviewed. Patient has no complaints of any changes since his last visit.   Message received from [facility] NP. Infuse 1 Liter of Normal Saline over an hour. Give 10 Units of Novolog subcutaneous.   Treatment given today per MD orders. Tolerated infusion without adverse affects. Vital signs stable. No complaints at this time. Discharged from clinic via wheel chair. F/U with [facility] as scheduled.

## 2019-06-10 NOTE — Patient Instructions (Signed)
[facility] at [facility]  Discharge Instructions:   _______________________________________________________________  Thank you for choosing [facility] at [facility] to provide your oncology and hematology care.  To afford each patient quality time with our providers, please arrive at least 15 minutes before your scheduled appointment.  You need to re-schedule your appointment if you arrive 10 or more minutes late.  We strive to give you quality time with our providers, and arriving late affects you and other patients whose appointments are after yours.  Also, if you no show three or more times for appointments you may be dismissed from the clinic.  Again, thank you for choosing [facility] at [facility]. Our hope is that these requests will allow you access to exceptional care and in a timely manner. _______________________________________________________________  If you have questions after your visit, please contact our office at [phone] between the hours of 8:30 a.m. and 5:00 p.m. Voicemails left after 4:30 p.m. will not be returned until the following business day. _______________________________________________________________  For prescription refill requests, have your pharmacy contact our office. _______________________________________________________________  Recommendations made by the consultant and any test results will be sent to your referring physician. _______________________________________________________________ 

## 2019-06-10 NOTE — Progress Notes (Signed)
CRITICAL VALUE ALERT  Critical Value:  Blood glucose 558  Date & Time Notied:  06/10/2019 at [place]  Provider Notified: Cristela Felt, NP  Orders Received/Actions taken: give Novolog 10 units [place] and NS 1L IV over 1 hour

## 2019-06-10 NOTE — Progress Notes (Signed)
Chase Herrera, [place] [address]   CLINIC:  Medical Oncology/Hematology  PCP:  [facility], [facility] [place] [address] [phone]   REASON FOR VISIT: Follow-up for small cell lung cancer   CURRENT THERAPY: Carboplatin, VP-16, Tecentriq  BRIEF ONCOLOGIC HISTORY:  Oncology History  Small cell lung cancer ([place])  04/03/2016 Initial Diagnosis   Small cell lung cancer ([place])   04/09/2016 Imaging   MRI brain- Negative for metastatic disease to the brain. No acute abnormality.   04/09/2016 Procedure   Right IJ port catheter placement by IR   04/13/2016 PET scan   . The right apical lung mass has significantly enlarged and there is new right paratracheal adenopathy as well as a hypermetabolic new lymph node in the right inner infraclavicular fossa potentially with early impingement on adjacent neurovascular structures. 2. Other imaging findings of potential clinical significance: Chronic bilateral maxillary sinusitis. Left carotid atherosclerotic calcification. Aortoiliac atherosclerotic vascular disease. Coronary atherosclerosis. Paraseptal emphysema. Mild atelectasis in the lung bases. Scattered sigmoid colon diverticula. Probable right pars defect at L5.   05/09/2016 - 07/12/2016 Radiation Therapy   Radiotherapy to right Lung+MS 63 Gy/35 fractions at 1.8 GY/f using 6 x photons 3- D CRT technique Dates: 4/18-6/21/18       06/19/2016 Treatment Plan Change   Cisplatin dose reduced by 20% for cycle #4 due to severe thrombocytopenia following cycle #3 (13,000).   08/21/2016 PET scan   1. Marked reduction in size and metabolic activity of RIGHT upper lobe mass. 2. Resolution of mediastinal and RIGHT supraclavicular lymphadenopathy. 3. No evidence of disease progression.   05/06/2019 -  Chemotherapy   The patient had palonosetron (ALOXI) injection 0.25 mg, 0.25 mg, Intravenous,  Once, 2 of 4  cycles Administration: 0.25 mg (05/06/2019), 0.25 mg (05/27/2019) pegfilgrastim-cbqv (UDENYCA) injection 6 mg, 6 mg, Subcutaneous, Once, 2 of 4 cycles Administration: 6 mg (05/11/2019), 6 mg (06/01/2019) CARBOplatin (PARAPLATIN) 450 mg in sodium chloride 0.9 % 250 mL chemo infusion, 450 mg (100 % of original dose 446.4 mg), Intravenous,  Once, 2 of 4 cycles Dose modification:   (original dose 446.4 mg, Cycle 1, Reason: Provider Judgment),   (original dose 500 mg, Cycle 2, Reason: Provider Judgment), 500 mg (original dose 500 mg, Cycle 2, Reason: Provider Judgment) Administration: 450 mg (05/06/2019), 500 mg (05/27/2019) etoposide (VEPESID) 190 mg in sodium chloride 0.9 % 500 mL chemo infusion, 80 mg/m2 = 190 mg (80 % of original dose 100 mg/m2), Intravenous,  Once, 2 of 4 cycles Dose modification: 80 mg/m2 (80 % of original dose 100 mg/m2, Cycle 1, Reason: Provider Judgment), 80 mg/m2 (original dose 100 mg/m2, Cycle 1, Reason: Provider Judgment), 80 mg/m2 (80 % of original dose 100 mg/m2, Cycle 2, Reason: Provider Judgment) Administration: 190 mg (05/06/2019), 190 mg (05/07/2019), 190 mg (05/08/2019), 190 mg (05/27/2019), 190 mg (05/28/2019), 190 mg (05/29/2019) fosaprepitant (EMEND) 150 mg in sodium chloride 0.9 % 145 mL IVPB, 150 mg, Intravenous,  Once, 2 of 4 cycles Administration: 150 mg (05/06/2019), 150 mg (05/27/2019) atezolizumab (TECENTRIQ) 1,200 mg in sodium chloride 0.9 % 250 mL chemo infusion, 1,200 mg, Intravenous, Once, 2 of 8 cycles Administration: 1,200 mg (05/06/2019), 1,200 mg (05/27/2019)  for chemotherapy treatment.      CANCER STAGING: Cancer Staging Small cell lung cancer ([place]) Staging form: Lung, AJCC 8th Edition - Clinical: Stage IIIC (cT3, cN3, cM0) - Signed by Twana First, MD on 04/16/2016    INTERVAL HISTORY:  Mr.  Chase Herrera 69 y.o. male returns for routine follow-up for small cell lung cancer.  Patient is 2 weeks out from his second cycle of treatment.  He reports he is doing better with  this treatment than the last.  He denies any new pains. Denies any nausea, vomiting, or diarrhea. Denies any new pains. Had not noticed any recent bleeding such as epistaxis, hematuria or hematochezia. Denies recent chest pain on exertion, pre-syncopal episodes, or palpitations. Denies any numbness or tingling in hands or feet. Denies any recent fevers, infections, or recent hospitalizations. Patient reports appetite at 75% and energy level at 25%.  He is eating well maintain his weight at this time.     REVIEW OF SYSTEMS:  Review of Systems  Constitutional: Positive for fatigue.  Respiratory: Positive for cough.   Cardiovascular: Positive for leg swelling.  All other systems reviewed and are negative.    PAST MEDICAL/SURGICAL HISTORY:  Past Medical History:  Diagnosis Date  . Alcohol abuse    Quit 08/23/11  . Arthritis   . Atrial fibrillation ([place])   . Atrial flutter ([place])    CTI ablation by Dr [name] 10/2011  . Bacteremia due to Gram-positive bacteria 11/29/2016  . CHF (congestive heart failure) ([place])   . COPD (chronic obstructive pulmonary disease) ([place])   . Depression   . Essential hypertension   . History of cardiomyopathy    LVEF 25-30% 08/2011 with subsequent normalization  . History of kidney stones   . Obesity   . Prostate enlargement   . Small cell lung cancer ([place])    Right upper lobe - follows with [facility]  . Type 2 diabetes mellitus ([place])    Past Surgical History:  Procedure Laterality Date  . APPENDECTOMY    . ATRIAL ABLATION SURGERY  11/06/2011   CTI ablation for atrial flutter by Dr [name]  . ATRIAL FLUTTER ABLATION N/A 11/06/2011   Procedure: ATRIAL FLUTTER ABLATION;  Surgeon: Thompson Grayer, MD;  Location: [facility] CATH LAB;  Service: Cardiovascular;  Laterality: N/A;  . Cataracts    . COLONOSCOPY WITH PROPOFOL N/A 06/06/2017   Procedure: COLONOSCOPY WITH PROPOFOL;  Surgeon: [name], MD;  Location: [facility] ENDO SUITE;  Service: Endoscopy;  Laterality:  N/A;  1:15pm  . IR GENERIC HISTORICAL  04/09/2016   IR US GUIDE VASC ACCESS RIGHT 04/09/2016 [name], DO [facility]-INTERV RAD  . IR GENERIC HISTORICAL  04/09/2016   IR FLUORO GUIDE PORT INSERTION RIGHT 04/09/2016 [name], DO [facility]-INTERV RAD  . PORT-A-CATH REMOVAL N/A 04/24/2017   Procedure: MINOR REMOVAL PORT-A-CATH;  Surgeon: [name], MD;  Location: [facility] ORS;  Service: General;  Laterality: N/A;  . PORTACATH PLACEMENT Left 04/22/2019   Procedure: INSERTION PORT-A-CATH;  Surgeon: Aviva Signs, MD;  Location: [facility] ORS;  Service: General;  Laterality: Left;  . TONSILLECTOMY       SOCIAL HISTORY:  Social History   Socioeconomic History  . Marital status: Single    Spouse name: Not on file  . Number of children: Not on file  . Years of education: Not on file  . Highest education level: Not on file  Occupational History  . Occupation: retired     Comment: Building services engineer  Tobacco Use  . Smoking status: Former Smoker    Packs/day: 0.50    Years: 47.00    Pack years: 23.50    Types: Cigarettes    Quit date: 05/30/2012    Years since quitting: 7.0  . Smokeless tobacco: Former Systems developer  Quit date: 04/10/2014  . Tobacco comment: encouraged to quit today 03/04/12  Substance and Sexual Activity  . Alcohol use: No    Comment: quit 3 1/2 years ago  . Drug use: Not Currently    Comment: marajuana occasionally  . Sexual activity: Not Currently  Other Topics Concern  . Not on file  Social History Narrative   Lives in [place] alone.   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 Difficulty of Paying Living Expenses:   Food Insecurity:   . Worried About Charity fundraiser in the Last Year:   . Arboriculturist in the Last Year:   Transportation Needs:   . Film/video editor (Medical):   Marland Kitchen Lack of Transportation (Non-Medical):   Physical Activity:   . Days of Exercise per Week:   .  Minutes of Exercise per Session:   Stress:   . Feeling of Stress :   Social Connections:   . Frequency of Communication with Friends and Family:   . Frequency of Social Gatherings with Friends and Family:   . Attends Religious Services:   . Active Member of Clubs or Organizations:   . Attends Archivist Meetings:   Marland Kitchen Marital Status:   Intimate Partner Violence:   . Fear of Current or Ex-Partner:   . Emotionally Abused:   Marland Kitchen Physically Abused:   . Sexually Abused:     FAMILY HISTORY:  Family History  Problem Relation Age of Onset  . Diabetes Sister   . Hypertension Mother   . Lung cancer Mother   . Hypertension Father   . Heart attack Father   . Hypertension Sister   . Hypertension Sister   . Hypertension Sister   . Pulmonary fibrosis Sister   . Breast cancer Cousin   . Colon cancer Neg Hx     CURRENT MEDICATIONS:  Outpatient Encounter Medications as of 06/10/2019  Medication Sig  . amoxicillin-clavulanate (AUGMENTIN) 875-125 MG tablet SMARTSIG:1 Tablet(s) By Mouth Every 12 Hours  . AQUALANCE LANCETS 30G MISC USE TO check blood glucose twice daily  . atorvastatin (LIPITOR) 20 MG tablet Take 1 tablet (20 mg total) by mouth daily.  Marland Kitchen diltiazem (DILACOR XR) 240 MG 24 hr capsule Take 240 mg by mouth daily.  Marland Kitchen dofetilide (TIKOSYN) 250 MCG capsule Take 1 capsule (250 mcg total) by mouth 2 (two) times daily.  . fluticasone (FLONASE) 50 MCG/ACT nasal spray Place 2 sprays into both nostrils daily.  . Fluticasone-Umeclidin-Vilant (TRELEGY ELLIPTA) 100-62.5-25 MCG/INH AEPB Inhale 1 puff into the lungs daily.  Marland Kitchen gabapentin (NEURONTIN) 300 MG capsule Take 600 mg by mouth 2 (two) times daily.   . hydrOXYzine (VISTARIL) 25 MG capsule Take 25 mg by mouth every 8 (eight) hours.  . insulin lispro (HUMALOG) 100 UNIT/ML injection Inject 2-20 Units into the skin 3 (three) times daily with meals. Sliding scale   . LANTUS SOLOSTAR 100 UNIT/ML Solostar Pen INJECT 20 UNITS UNDER THE SKIN  EVERY MORNING AND INJECT 20 UNITS UNDER THE SKIN EVERY EVENING (Patient taking differently: Inject 20 Units into the skin 2 (two) times daily. )  . levalbuterol (XOPENEX) 0.63 MG/3ML nebulizer solution Inhale 3 mLs (0.63 mg total) into the lungs 4 (four) times daily.  . Lidocaine (SALONPAS PAIN RELIEVING EX) Apply 1 patch topically daily.   . metFORMIN (GLUCOPHAGE) 500 MG tablet Take 500 mg  by mouth in the morning and at bedtime.  . metoprolol succinate (TOPROL-XL) 100 MG 24 hr tablet Take 100 mg by mouth daily.  . Multiple Vitamin (MULTIVITAMIN WITH MINERALS) TABS tablet Take 1 tablet by mouth daily.  . potassium chloride (KLOR-CON) 10 MEQ tablet Take 10 mEq by mouth 2 (two) times daily.  . predniSONE (STERAPRED UNI-PAK 48 TAB) 5 MG (48) TBPK tablet TAKE DOSE PACK AS DIRECTED PER PACKAGE INSTRUCTIONS  . pregabalin (LYRICA) 150 MG capsule Take 150 mg by mouth in the morning and at bedtime.  . senna-docusate (SENOKOT-S) 8.6-50 MG tablet Take 2 tablets by mouth at bedtime.  Marland Kitchen spironolactone (ALDACTONE) 25 MG tablet Take 12.5 mg by mouth 2 (two) times daily.   . tamsulosin (FLOMAX) 0.4 MG CAPS capsule Take 1 capsule (0.4 mg total) by mouth 2 (two) times daily after a meal.  . tiotropium (SPIRIVA HANDIHALER) 18 MCG inhalation capsule Place 1 capsule (18 mcg total) into inhaler and inhale daily.  . traZODone (DESYREL) 50 MG tablet Take 50 mg by mouth at bedtime.  Marland Kitchen acetaminophen (TYLENOL) 500 MG tablet Take 1,000 mg by mouth every 6 (six) hours as needed for moderate pain or headache.  Marland Kitchen apixaban (ELIQUIS) 5 MG TABS tablet Take 5 mg by mouth 2 (two) times daily.   . Artificial Saliva (BIOTENE DRY MOUTH MOISTURIZING) SOLN Apply 1 Dose topically as needed (dry mouth).   . benzonatate (TESSALON) 200 MG capsule Take 1 capsule (200 mg total) by mouth 3 (three) times daily as needed for cough. (Patient not taking: Reported on 05/21/2019)  . Carboxymethylcellul-Glycerin (LUBRICATING EYE DROPS OP) Place 1 drop  into the left eye daily as needed (dry eyes).  . furosemide (LASIX) 40 MG tablet TAKE 1 TABLET BY MOUTH EVERY DAY (Patient not taking: No sig reported)  . guaifenesin (ROBITUSSIN) 100 MG/5ML syrup Take 200 mg by mouth at bedtime as needed for cough.  Marland Kitchen HYDROcodone-acetaminophen (NORCO/VICODIN) 5-325 MG tablet Take 1 tablet by mouth at bedtime as needed. (Patient not taking: Reported on 05/27/2019)  . ipratropium-albuterol (DUONEB) 0.5-2.5 (3) MG/3ML SOLN Take 3 mLs by nebulization every 4 (four) hours as needed (shortness of breath).   Elmore Guise Devices (ADJUSTABLE LANCING DEVICE) MISC TO check blood glucose daily  . lidocaine-prilocaine (EMLA) cream Apply a small amount to port a cath site and cover with plastic wrap 1 hour prior to chemotherapy appointments (Patient not taking: Reported on 05/21/2019)  . PROAIR HFA 108 (90 Base) MCG/ACT inhaler INHALE TWO PUFFS BY MOUTH EVERY 4 HOURS AS NEEDED FOR WHEEZING (Patient not taking: No sig reported)  . prochlorperazine (COMPAZINE) 10 MG tablet Take 1 tablet (10 mg total) by mouth every 6 (six) hours as needed (Nausea or vomiting). (Patient not taking: Reported on 05/21/2019)  . [DISCONTINUED] ACCU-CHEK AVIVA PLUS test strip check blood sugar TWICE DAILY   No facility-administered encounter medications on file as of 06/10/2019.    ALLERGIES:  No Known Allergies   PHYSICAL EXAM:  ECOG Performance status: 1  Vitals:   06/10/19 1313  BP: (!) 135/54  Pulse: 74  Resp: 20  Temp: (!) 96.6 F (35.9 C)  SpO2: 93%   Filed Weights   06/10/19 1313  Weight: 248 lb (112.5 kg)   Physical Exam Constitutional:      Appearance: He is obese.  Cardiovascular:     Rate and Rhythm: Normal rate and regular rhythm.     Heart sounds: Normal heart sounds.  Pulmonary:     Effort: Pulmonary  effort is normal.     Breath sounds: Normal breath sounds.  Abdominal:     General: Bowel sounds are normal.     Palpations: Abdomen is soft.  Musculoskeletal:         General: Normal range of motion.  Skin:    General: Skin is warm.  Neurological:     Mental Status: He is alert and oriented to person, place, and time. Mental status is at baseline.  Psychiatric:        Mood and Affect: Mood normal.        Behavior: Behavior normal.        Thought Content: Thought content normal.        Judgment: Judgment normal.      LABORATORY DATA:  I have reviewed the labs as listed.  CBC    Component Value Date/Time   WBC 10.9 (H) 06/10/2019 1233   RBC 3.18 (L) 06/10/2019 1233   HGB 10.2 (L) 06/10/2019 1233   HCT 32.6 (L) 06/10/2019 1233   PLT 112 (L) 06/10/2019 1233   MCV 102.5 (H) 06/10/2019 1233   MCH 32.1 06/10/2019 1233   MCHC 31.3 06/10/2019 1233   RDW 16.7 (H) 06/10/2019 1233   LYMPHSABS 0.5 (L) 06/10/2019 1233   MONOABS 0.9 06/10/2019 1233   EOSABS 0.0 06/10/2019 1233   BASOSABS 0.1 06/10/2019 1233   CMP Latest Ref Rng & Units 06/10/2019 05/27/2019 05/21/2019  Glucose 70 - 99 mg/dL 558(HH) 365(H) 311(H)  BUN 8 - 23 mg/dL 25(H) 20 8  Creatinine 0.61 - 1.24 mg/dL 1.60(H) 1.04 1.02  Sodium 135 - 145 mmol/L 129(L) 133(L) 138  Potassium 3.5 - 5.1 mmol/L 5.2(H) 4.3 4.3  Chloride 98 - 111 mmol/L 88(L) 96(L) 97(L)  CO2 22 - 32 mmol/L 26 28 31   Calcium 8.9 - 10.3 mg/dL 9.3 9.1 8.8(L)  Total Protein 6.5 - 8.1 g/dL 6.6 6.8 6.4(L)  Total Bilirubin 0.3 - 1.2 mg/dL 0.6 0.4 0.3  Alkaline Phos 38 - 126 U/L 83 65 64  AST 15 - 41 U/L 18 27 24   ALT 0 - 44 U/L 30 43 43   All questions were answered to patient's stated satisfaction. Encouraged patient to call with any new concerns or questions before his next visit to the cancer center and we can certain see him sooner, if needed.     ASSESSMENT & PLAN:  Small cell lung cancer ([place]) 1.  Recurrent small cell lung cancer: -Cycle 1 of carboplatin, VP-16 and atezolizumab on 05/06/2019 with Neulasta. -Hospitalized from 05/15/2019 through 05/18/2019 with shortness of breath and neutropenia.  He was treated for  pneumonia with antibiotics. -MRI of the brain on 05/13/2019 showed small 7 mm rim-enhancing lesion at the junction of the left posterior temporal and anterior occipital lobes compatible with solitary metastasis.  The typical appearance of lacunar infarct is also considered. -He received cycle 2 of chemotherapy on 05/27/2019 the carboplatin was cut back to AUC 4 and etoposide to 80 mg/m.  He received the Neulasta on 06/01/2019. -He is being followed closely due to hospitalizations after treatments. -Labs done on 06/10/2019 showed creatinine 1.60, potassium 5.2, glucose 558, WBC 10.9, hemoglobin 10.2, platelets 112 -We will give him 1 L of normal saline.  We will also give him 10 units of NovoLog. -He will return in 1 week for repeat labs and possible treatment.  Plan is to repeat scans after cycle 3.  2.  Diabetes: -He is taking Lantus 20 units twice daily.  He uses  NovoLog sliding scale at home. -Today on 06/10/2019 his blood sugar is 558. -We will give him 10 units of NovoLog subcu. -We will recheck his blood sugar prior to him leaving today. -We will also give him 1 L of normal saline.  3.  Atrial fibrillation: -Continue Eliquis 5 mg twice daily. -No bleeding issues.  4.  Lower back pain: -She will continue hydrocodone 5/325 at bedtime as needed     Orders placed this encounter:  Orders Placed This Encounter  Procedures  . Lactate dehydrogenase  . Magnesium  . CBC with Differential/Platelet  . Comprehensive metabolic panel      Francene Finders, FNP-C [place] [phone]

## 2019-06-10 NOTE — Assessment & Plan Note (Signed)
1.  Recurrent small cell lung cancer: -Cycle 1 of carboplatin, VP-16 and atezolizumab on 05/06/2019 with Neulasta. -Hospitalized from 05/15/2019 through 05/18/2019 with shortness of breath and neutropenia.  He was treated for pneumonia with antibiotics. -MRI of the brain on 05/13/2019 showed small 7 mm rim-enhancing lesion at the junction of the left posterior temporal and anterior occipital lobes compatible with solitary metastasis.  The typical appearance of lacunar infarct is also considered. -He received cycle 2 of chemotherapy on 05/27/2019 the carboplatin was cut back to AUC 4 and etoposide to 80 mg/m.  He received the Neulasta on 06/01/2019. -He is being followed closely due to hospitalizations after treatments. -Labs done on 06/10/2019 showed creatinine 1.60, potassium 5.2, glucose 558, WBC 10.9, hemoglobin 10.2, platelets 112 -We will give him 1 L of normal saline.  We will also give him 10 units of NovoLog. -He will return in 1 week for repeat labs and possible treatment.  Plan is to repeat scans after cycle 3.  2.  Diabetes: -He is taking Lantus 20 units twice daily.  He uses NovoLog sliding scale at home. -Today on 06/10/2019 his blood sugar is 558. -We will give him 10 units of NovoLog subcu. -We will recheck his blood sugar prior to him leaving today. -We will also give him 1 L of normal saline.  3.  Atrial fibrillation: -Continue Eliquis 5 mg twice daily. -No bleeding issues.  4.  Lower back pain: -She will continue hydrocodone 5/325 at bedtime as needed

## 2019-06-17 ENCOUNTER — Inpatient Hospital Stay (HOSPITAL_COMMUNITY): Payer: Medicare Other

## 2019-06-17 ENCOUNTER — Other Ambulatory Visit: Payer: Self-pay

## 2019-06-17 ENCOUNTER — Inpatient Hospital Stay (HOSPITAL_COMMUNITY): Payer: Medicare Other | Admitting: Hematology

## 2019-06-17 VITALS — BP 123/61 | HR 85 | Temp 97.5°F | Resp 20 | Wt 247.2 lb

## 2019-06-17 VITALS — BP 121/62 | HR 71 | Temp 97.0°F | Resp 18

## 2019-06-17 DIAGNOSIS — C3491 Malignant neoplasm of unspecified part of right bronchus or lung: Secondary | ICD-10-CM | POA: Diagnosis not present

## 2019-06-17 DIAGNOSIS — E669 Obesity, unspecified: Secondary | ICD-10-CM

## 2019-06-17 DIAGNOSIS — E1169 Type 2 diabetes mellitus with other specified complication: Secondary | ICD-10-CM

## 2019-06-17 DIAGNOSIS — Z5112 Encounter for antineoplastic immunotherapy: Secondary | ICD-10-CM | POA: Diagnosis not present

## 2019-06-17 LAB — COMPREHENSIVE METABOLIC PANEL
ALT: 29 U/L (ref 0–44)
AST: 20 U/L (ref 15–41)
Albumin: 3.8 g/dL (ref 3.5–5.0)
Alkaline Phosphatase: 71 U/L (ref 38–126)
Anion gap: 13 (ref 5–15)
BUN: 24 mg/dL — ABNORMAL HIGH (ref 8–23)
CO2: 27 mmol/L (ref 22–32)
Calcium: 9 mg/dL (ref 8.9–10.3)
Chloride: 92 mmol/L — ABNORMAL LOW (ref 98–111)
Creatinine, Ser: 1.12 mg/dL (ref 0.61–1.24)
GFR calc Af Amer: >60 mL/min (ref >60)
GFR calc non Af Amer: >60 mL/min (ref >60)
Glucose, Bld: 287 mg/dL — ABNORMAL HIGH (ref 70–99)
Potassium: 4.5 mmol/L (ref 3.5–5.1)
Sodium: 132 mmol/L — ABNORMAL LOW (ref 135–145)
Total Bilirubin: 0.8 mg/dL (ref 0.3–1.2)
Total Protein: 6.7 g/dL (ref 6.5–8.1)

## 2019-06-17 LAB — CBC WITH DIFFERENTIAL/PLATELET
Abs Immature Granulocytes: 0.45 10*3/uL — ABNORMAL HIGH (ref 0.00–0.07)
Basophils Absolute: 0.1 10*3/uL (ref 0.0–0.1)
Basophils Relative: 0 %
Eosinophils Absolute: 0.1 10*3/uL (ref 0.0–0.5)
Eosinophils Relative: 1 %
HCT: 37.2 % — ABNORMAL LOW (ref 39.0–52.0)
Hemoglobin: 11.5 g/dL — ABNORMAL LOW (ref 13.0–17.0)
Immature Granulocytes: 4 %
Lymphocytes Relative: 5 %
Lymphs Abs: 0.6 10*3/uL — ABNORMAL LOW (ref 0.7–4.0)
MCH: 31.9 pg (ref 26.0–34.0)
MCHC: 30.9 g/dL (ref 30.0–36.0)
MCV: 103 fL — ABNORMAL HIGH (ref 80.0–100.0)
Monocytes Absolute: 1.2 10*3/uL — ABNORMAL HIGH (ref 0.1–1.0)
Monocytes Relative: 10 %
Neutro Abs: 9.1 10*3/uL — ABNORMAL HIGH (ref 1.7–7.7)
Neutrophils Relative %: 80 %
Platelets: 212 10*3/uL (ref 150–400)
RBC: 3.61 MIL/uL — ABNORMAL LOW (ref 4.22–5.81)
RDW: 18.6 % — ABNORMAL HIGH (ref 11.5–15.5)
WBC: 11.4 10*3/uL — ABNORMAL HIGH (ref 4.0–10.5)
nRBC: 0.4 % — ABNORMAL HIGH (ref 0.0–0.2)

## 2019-06-17 LAB — TSH: TSH: 2.915 u[IU]/mL (ref 0.350–4.500)

## 2019-06-17 MED ORDER — PALONOSETRON HCL INJECTION 0.25 MG/5ML
0.2500 mg | Freq: Once | INTRAVENOUS | Status: AC
  Administered 2019-06-17: 0.25 mg via INTRAVENOUS
  Filled 2019-06-17: qty 5

## 2019-06-17 MED ORDER — SODIUM CHLORIDE 0.9 % IV SOLN
500.0000 mg | Freq: Once | INTRAVENOUS | Status: AC
  Administered 2019-06-17: 500 mg via INTRAVENOUS
  Filled 2019-06-17: qty 50

## 2019-06-17 MED ORDER — SODIUM CHLORIDE 0.9 % IV SOLN
150.0000 mg | Freq: Once | INTRAVENOUS | Status: AC
  Administered 2019-06-17: 150 mg via INTRAVENOUS
  Filled 2019-06-17: qty 150

## 2019-06-17 MED ORDER — SODIUM CHLORIDE 0.9 % IV SOLN: Freq: Once | INTRAVENOUS | Status: AC

## 2019-06-17 MED ORDER — HEPARIN SOD (PORK) LOCK FLUSH 100 UNIT/ML IV SOLN
500.0000 [IU] | Freq: Once | INTRAVENOUS | Status: AC | PRN
  Administered 2019-06-17: 500 [IU]

## 2019-06-17 MED ORDER — SODIUM CHLORIDE 0.9 % IV SOLN
80.0000 mg/m2 | Freq: Once | INTRAVENOUS | Status: AC
  Administered 2019-06-17: 190 mg via INTRAVENOUS
  Filled 2019-06-17: qty 9.5

## 2019-06-17 MED ORDER — "INSULIN SYRINGE-NEEDLE U-100 28G X 1/2"" 1 ML MISC": 1.0000 | Freq: Three times a day (TID) | 3 refills | Status: AC

## 2019-06-17 MED ORDER — SODIUM CHLORIDE 0.9% FLUSH
10.0000 mL | INTRAVENOUS | Status: DC | PRN
  Administered 2019-06-17: 10 mL

## 2019-06-17 MED ORDER — SODIUM CHLORIDE 0.9 % IV SOLN
1200.0000 mg | Freq: Once | INTRAVENOUS | Status: AC
  Administered 2019-06-17: 1200 mg via INTRAVENOUS
  Filled 2019-06-17: qty 20

## 2019-06-17 MED ORDER — SODIUM CHLORIDE 0.9 % IV SOLN
10.0000 mg | Freq: Once | INTRAVENOUS | Status: AC
  Administered 2019-06-17: 10 mg via INTRAVENOUS
  Filled 2019-06-17: qty 10

## 2019-06-17 NOTE — Progress Notes (Signed)
.  Pharmacist Chemotherapy Monitoring - Follow Up Assessment    I verify that I have reviewed each item in the below checklist:  . Regimen for the patient is scheduled for the appropriate day and plan matches scheduled date. Marland Kitchen Appropriate non-routine labs are ordered dependent on drug ordered. . If applicable, additional medications reviewed and ordered per protocol based on lifetime cumulative doses and/or treatment regimen.   Plan for follow-up and/or issues identified: No . I-vent associated with next due treatment: No . MD and/or nursing notified: No  . Confirming completion of prednisone taper.  Wynona Neat 06/17/2019 10:07 AM

## 2019-06-17 NOTE — Progress Notes (Signed)
Patient instructed to take two tabs of prednisone daily starting today verbal order Dr. Delton Coombes.  Patient verbalized understanding.   Patient tolerated chemotherapy with no complaints voiced.  Side effects with management reviewed with understanding verbalized.  Port site clean and dry with no bruising or swelling noted at site.  Good blood return noted before and after administration of chemotherapy.  Band aid applied.  Patient left by wheelchair with VSS and no s/s of distress noted.

## 2019-06-17 NOTE — Progress Notes (Signed)
[place], [place] [address]   CLINIC:  Medical Oncology/Hematology  PCP:  [facility] [place] / [place] [address] [phone]   REASON FOR VISIT:  Follow-up for small cell lung cancer   NGS Results: Not applicable.  CURRENT THERAPY: Carboplatin, VP-16, Tecentriq  BRIEF ONCOLOGIC HISTORY:  Oncology History  Small cell lung cancer ([place])  04/03/2016 Initial Diagnosis   Small cell lung cancer ([place])   04/09/2016 Imaging   MRI brain- Negative for metastatic disease to the brain. No acute abnormality.   04/09/2016 Procedure   Right IJ port catheter placement by IR   04/13/2016 PET scan   . The right apical lung mass has significantly enlarged and there is new right paratracheal adenopathy as well as a hypermetabolic new lymph node in the right inner infraclavicular fossa potentially with early impingement on adjacent neurovascular structures. 2. Other imaging findings of potential clinical significance: Chronic bilateral maxillary sinusitis. Left carotid atherosclerotic calcification. Aortoiliac atherosclerotic vascular disease. Coronary atherosclerosis. Paraseptal emphysema. Mild atelectasis in the lung bases. Scattered sigmoid colon diverticula. Probable right pars defect at L5.   05/09/2016 - 07/12/2016 Radiation Therapy   Radiotherapy to right Lung+MS 63 Gy/35 fractions at 1.8 GY/f using 6 x photons 3- D CRT technique Dates: 4/18-6/21/18       06/19/2016 Treatment Plan Change   Cisplatin dose reduced by 20% for cycle #4 due to severe thrombocytopenia following cycle #3 (13,000).   08/21/2016 PET scan   1. Marked reduction in size and metabolic activity of RIGHT upper lobe mass. 2. Resolution of mediastinal and RIGHT supraclavicular lymphadenopathy. 3. No evidence of disease progression.   05/06/2019 -  Chemotherapy   The patient had palonosetron (ALOXI) injection 0.25 mg, 0.25 mg,  Intravenous,  Once, 2 of 4 cycles Administration: 0.25 mg (05/06/2019), 0.25 mg (05/27/2019) pegfilgrastim-cbqv (UDENYCA) injection 6 mg, 6 mg, Subcutaneous, Once, 2 of 4 cycles Administration: 6 mg (05/11/2019), 6 mg (06/01/2019) CARBOplatin (PARAPLATIN) 450 mg in sodium chloride 0.9 % 250 mL chemo infusion, 450 mg (100 % of original dose 446.4 mg), Intravenous,  Once, 2 of 4 cycles Dose modification:   (original dose 446.4 mg, Cycle 1, Reason: Provider Judgment),   (original dose 500 mg, Cycle 2, Reason: Provider Judgment), 500 mg (original dose 500 mg, Cycle 2, Reason: Provider Judgment) Administration: 450 mg (05/06/2019), 500 mg (05/27/2019) etoposide (VEPESID) 190 mg in sodium chloride 0.9 % 500 mL chemo infusion, 80 mg/m2 = 190 mg (80 % of original dose 100 mg/m2), Intravenous,  Once, 2 of 4 cycles Dose modification: 80 mg/m2 (80 % of original dose 100 mg/m2, Cycle 1, Reason: Provider Judgment), 80 mg/m2 (original dose 100 mg/m2, Cycle 1, Reason: Provider Judgment), 80 mg/m2 (80 % of original dose 100 mg/m2, Cycle 2, Reason: Provider Judgment) Administration: 190 mg (05/06/2019), 190 mg (05/07/2019), 190 mg (05/08/2019), 190 mg (05/27/2019), 190 mg (05/28/2019), 190 mg (05/29/2019) fosaprepitant (EMEND) 150 mg in sodium chloride 0.9 % 145 mL IVPB, 150 mg, Intravenous,  Once, 2 of 4 cycles Administration: 150 mg (05/06/2019), 150 mg (05/27/2019) atezolizumab (TECENTRIQ) 1,200 mg in sodium chloride 0.9 % 250 mL chemo infusion, 1,200 mg, Intravenous, Once, 2 of 8 cycles Administration: 1,200 mg (05/06/2019), 1,200 mg (05/27/2019)  for chemotherapy treatment.      CANCER STAGING: Cancer Staging Small cell lung cancer ([place]) Staging form: Lung, AJCC 8th Edition - Clinical: Stage IIIC (cT3, cN3, cM0) - Signed by Twana First, MD on 04/16/2016  INTERVAL HISTORY:  [name], a 69 y.o. male, returns for routine follow-up and consideration for next cycle of chemotherapy. Chaske was last seen on 06/10/2019 by  Francene Finders, NP-C.  He notes that he went to the emergency room for his breathing, but that he did not have to get admitted.   Due for cycle #3 of Carboplatin, VP-16, and Tecentriq today.   Overall, he tells me he has been feeling pretty well.   Overall, he feels ready for next cycle of chemo today.    REVIEW OF SYSTEMS:  Review of Systems  Constitutional: Positive for fatigue (moderate).  Respiratory: Positive for cough and shortness of breath.   Neurological: Positive for numbness (hands and feet).  All other systems reviewed and are negative.   PAST MEDICAL/SURGICAL HISTORY:  Past Medical History:  Diagnosis Date  . Alcohol abuse    Quit 08/23/11  . Arthritis   . Atrial fibrillation ([place])   . Atrial flutter ([place])    CTI ablation by Dr [name] 10/2011  . Bacteremia due to Gram-positive bacteria 11/29/2016  . CHF (congestive heart failure) ([place])   . COPD (chronic obstructive pulmonary disease) ([place])   . Depression   . Essential hypertension   . History of cardiomyopathy    LVEF 25-30% 08/2011 with subsequent normalization  . History of kidney stones   . Obesity   . Prostate enlargement   . Small cell lung cancer ([place])    Right upper lobe - [facility]  . Type 2 diabetes mellitus ([place])    Past Surgical History:  Procedure Laterality Date  . APPENDECTOMY    . ATRIAL ABLATION SURGERY  11/06/2011   CTI ablation for atrial flutter by Dr [name]  . ATRIAL FLUTTER ABLATION N/A 11/06/2011   Procedure: ATRIAL FLUTTER ABLATION;  Surgeon: Thompson Grayer, MD;  Location: [facility] CATH LAB;  Service: Cardiovascular;  Laterality: N/A;  . Cataracts    . COLONOSCOPY WITH PROPOFOL N/A 06/06/2017   Procedure: COLONOSCOPY WITH PROPOFOL;  Surgeon: [name], MD;  Location: [facility] ENDO SUITE;  Service: Endoscopy;  Laterality: N/A;  1:15pm  . IR GENERIC HISTORICAL  04/09/2016   IR US GUIDE VASC ACCESS RIGHT 04/09/2016 [name], DO [facility]-INTERV RAD  . IR GENERIC HISTORICAL   04/09/2016   IR FLUORO GUIDE PORT INSERTION RIGHT 04/09/2016 [name], DO [facility]-INTERV RAD  . PORT-A-CATH REMOVAL N/A 04/24/2017   Procedure: MINOR REMOVAL PORT-A-CATH;  Surgeon: [name], MD;  Location: [facility] ORS;  Service: General;  Laterality: N/A;  . PORTACATH PLACEMENT Left 04/22/2019   Procedure: INSERTION PORT-A-CATH;  Surgeon: Aviva Signs, MD;  Location: [facility] ORS;  Service: General;  Laterality: Left;  . TONSILLECTOMY      SOCIAL HISTORY:  Social History   Socioeconomic History  . Marital status: Single    Spouse name: Not on file  . Number of children: Not on file  . Years of education: Not on file  . Highest education level: Not on file  Occupational History  . Occupation: retired     Comment: Building services engineer  Tobacco Use  . Smoking status: Former Smoker    Packs/day: 0.50    Years: 47.00    Pack years: 23.50    Types: Cigarettes    Quit date: 05/30/2012    Years since quitting: 7.0  . Smokeless tobacco: Former Systems developer    Quit date: 04/10/2014  . Tobacco comment: encouraged to quit today 03/04/12  Substance and Sexual Activity  .  Alcohol use: No    Comment: quit 3 1/2 years ago  . Drug use: Not Currently    Comment: marajuana occasionally  . Sexual activity: Not Currently  Other Topics Concern  . Not on file  Social History Narrative   Lives in [place] alone.   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 Difficulty of Paying Living Expenses:   Food Insecurity:   . Worried About Charity fundraiser in the Last Year:   . Arboriculturist in the Last Year:   Transportation Needs:   . Film/video editor (Medical):   Marland Kitchen Lack of Transportation (Non-Medical):   Physical Activity:   . Days of Exercise per Week:   . Minutes of Exercise per Session:   Stress:   . Feeling of Stress :   Social Connections:   . Frequency of Communication with Friends and Family:   .  Frequency of Social Gatherings with Friends and Family:   . Attends Religious Services:   . Active Member of Clubs or Organizations:   . Attends Archivist Meetings:   Marland Kitchen Marital Status:   Intimate Partner Violence:   . Fear of Current or Ex-Partner:   . Emotionally Abused:   Marland Kitchen Physically Abused:   . Sexually Abused:     FAMILY HISTORY:  Family History  Problem Relation Age of Onset  . Diabetes Sister   . Hypertension Mother   . Lung cancer Mother   . Hypertension Father   . Heart attack Father   . Hypertension Sister   . Hypertension Sister   . Hypertension Sister   . Pulmonary fibrosis Sister   . Breast cancer Cousin   . Colon cancer Neg Hx     CURRENT MEDICATIONS:  Current Outpatient Medications  Medication Sig Dispense Refill  . amoxicillin-clavulanate (AUGMENTIN) 875-125 MG tablet SMARTSIG:1 Tablet(s) By Mouth Every 12 Hours    . apixaban (ELIQUIS) 5 MG TABS tablet Take 5 mg by mouth 2 (two) times daily.     Marland Kitchen AQUALANCE LANCETS 30G MISC USE TO check blood glucose twice daily  99  . atorvastatin (LIPITOR) 20 MG tablet Take 1 tablet (20 mg total) by mouth daily. 90 tablet 3  . diltiazem (DILACOR XR) 240 MG 24 hr capsule Take 240 mg by mouth daily.    Marland Kitchen dofetilide (TIKOSYN) 250 MCG capsule Take 1 capsule (250 mcg total) by mouth 2 (two) times daily.    . fluticasone (FLONASE) 50 MCG/ACT nasal spray Place 2 sprays into both nostrils daily. 16 g 6  . Fluticasone-Umeclidin-Vilant (TRELEGY ELLIPTA) 100-62.5-25 MCG/INH AEPB Inhale 1 puff into the lungs daily.    . furosemide (LASIX) 40 MG tablet TAKE 1 TABLET BY MOUTH EVERY DAY 90 tablet 0  . gabapentin (NEURONTIN) 300 MG capsule Take 600 mg by mouth 2 (two) times daily.     . hydrOXYzine (VISTARIL) 25 MG capsule Take 25 mg by mouth every 8 (eight) hours.    . insulin lispro (HUMALOG) 100 UNIT/ML injection Inject 2-20 Units into the skin 3 (three) times daily with meals. Sliding scale     . Lancet Devices (ADJUSTABLE  LANCING DEVICE) MISC TO check blood glucose daily    . LANTUS SOLOSTAR 100 UNIT/ML Solostar Pen INJECT 20 UNITS UNDER THE SKIN EVERY MORNING AND INJECT 20 UNITS UNDER THE SKIN EVERY EVENING (Patient taking differently: Inject  20 Units into the skin 2 (two) times daily. ) 45 mL 0  . levalbuterol (XOPENEX) 0.63 MG/3ML nebulizer solution Inhale 3 mLs (0.63 mg total) into the lungs 4 (four) times daily. 3 mL 0  . Lidocaine (SALONPAS PAIN RELIEVING EX) Apply 1 patch topically daily.     . metFORMIN (GLUCOPHAGE) 500 MG tablet Take 500 mg by mouth in the morning and at bedtime.    . metoprolol succinate (TOPROL-XL) 100 MG 24 hr tablet Take 100 mg by mouth daily.    . Multiple Vitamin (MULTIVITAMIN WITH MINERALS) TABS tablet Take 1 tablet by mouth daily.    . potassium chloride (KLOR-CON) 10 MEQ tablet Take 10 mEq by mouth 2 (two) times daily.    . predniSONE (STERAPRED UNI-PAK 48 TAB) 5 MG (48) TBPK tablet TAKE DOSE PACK AS DIRECTED PER PACKAGE INSTRUCTIONS    . pregabalin (LYRICA) 150 MG capsule Take 150 mg by mouth in the morning and at bedtime.    . senna-docusate (SENOKOT-S) 8.6-50 MG tablet Take 2 tablets by mouth at bedtime. 60 tablet 0  . spironolactone (ALDACTONE) 25 MG tablet Take 12.5 mg by mouth 2 (two) times daily.     . tamsulosin (FLOMAX) 0.4 MG CAPS capsule Take 1 capsule (0.4 mg total) by mouth 2 (two) times daily after a meal. 180 capsule 1  . tiotropium (SPIRIVA HANDIHALER) 18 MCG inhalation capsule Place 1 capsule (18 mcg total) into inhaler and inhale daily. 30 capsule 12  . traZODone (DESYREL) 50 MG tablet Take 50 mg by mouth at bedtime.  5  . acetaminophen (TYLENOL) 500 MG tablet Take 1,000 mg by mouth every 6 (six) hours as needed for moderate pain or headache.    . Artificial Saliva (BIOTENE DRY MOUTH MOISTURIZING) SOLN Apply 1 Dose topically as needed (dry mouth).     . benzonatate (TESSALON) 200 MG capsule Take 1 capsule (200 mg total) by mouth 3 (three) times daily as needed for  cough. (Patient not taking: Reported on 06/17/2019) 20 capsule 0  . Carboxymethylcellul-Glycerin (LUBRICATING EYE DROPS OP) Place 1 drop into the left eye daily as needed (dry eyes).    Marland Kitchen guaifenesin (ROBITUSSIN) 100 MG/5ML syrup Take 200 mg by mouth at bedtime as needed for cough.    Marland Kitchen HYDROcodone-acetaminophen (NORCO/VICODIN) 5-325 MG tablet Take 1 tablet by mouth at bedtime as needed. (Patient not taking: Reported on 06/17/2019) 30 tablet 0  . ipratropium-albuterol (DUONEB) 0.5-2.5 (3) MG/3ML SOLN Take 3 mLs by nebulization every 4 (four) hours as needed (shortness of breath).     . lidocaine-prilocaine (EMLA) cream Apply a small amount to port a cath site and cover with plastic wrap 1 hour prior to chemotherapy appointments (Patient not taking: Reported on 06/17/2019) 30 g 3  . PROAIR HFA 108 (90 Base) MCG/ACT inhaler INHALE TWO PUFFS BY MOUTH EVERY 4 HOURS AS NEEDED FOR WHEEZING (Patient not taking: Reported on 06/17/2019) 17 g 0  . prochlorperazine (COMPAZINE) 10 MG tablet Take 1 tablet (10 mg total) by mouth every 6 (six) hours as needed (Nausea or vomiting). (Patient not taking: Reported on 06/17/2019) 60 tablet 1   No current facility-administered medications for this visit.    ALLERGIES:  No Known Allergies  PHYSICAL EXAM:  Performance status (ECOG): 1 - Symptomatic but completely ambulatory  Vitals:   06/17/19 0823  BP: 123/61  Pulse: 85  Resp: 20  Temp: (!) 97.5 F (36.4 C)  SpO2: 91%   Wt Readings from Last 3 Encounters:  06/17/19  247 lb 3.2 oz (112.1 kg)  06/10/19 248 lb (112.5 kg)  05/27/19 248 lb 12.8 oz (112.9 kg)   Physical Exam Vitals reviewed.  Constitutional:      Appearance: Normal appearance.  Cardiovascular:     Rate and Rhythm: Normal rate and regular rhythm.     Heart sounds: Normal heart sounds.  Pulmonary:     Effort: Pulmonary effort is normal.     Breath sounds: Normal breath sounds.  Abdominal:     Palpations: Abdomen is soft.  Neurological:      General: No focal deficit present.     Mental Status: He is alert and oriented to person, place, and time.  Psychiatric:        Mood and Affect: Mood normal.        Behavior: Behavior normal.     LABORATORY DATA:  I have reviewed the labs as listed.  CBC Latest Ref Rng & Units 06/17/2019 06/10/2019 05/27/2019  WBC 4.0 - 10.5 K/uL 11.4(H) 10.9(H) 5.5  Hemoglobin 13.0 - 17.0 g/dL 11.5(L) 10.2(L) 12.1(L)  Hematocrit 39.0 - 52.0 % 37.2(L) 32.6(L) 38.4(L)  Platelets 150 - 400 K/uL 212 112(L) 218   CMP Latest Ref Rng & Units 06/17/2019 06/10/2019 05/27/2019  Glucose 70 - 99 mg/dL 287(H) 558(HH) 365(H)  BUN 8 - 23 mg/dL 24(H) 25(H) 20  Creatinine 0.61 - 1.24 mg/dL 1.12 1.60(H) 1.04  Sodium 135 - 145 mmol/L 132(L) 129(L) 133(L)  Potassium 3.5 - 5.1 mmol/L 4.5 5.2(H) 4.3  Chloride 98 - 111 mmol/L 92(L) 88(L) 96(L)  CO2 22 - 32 mmol/L 27 26 28   Calcium 8.9 - 10.3 mg/dL 9.0 9.3 9.1  Total Protein 6.5 - 8.1 g/dL 6.7 6.6 6.8  Total Bilirubin 0.3 - 1.2 mg/dL 0.8 0.6 0.4  Alkaline Phos 38 - 126 U/L 71 83 65  AST 15 - 41 U/L 20 18 27   ALT 0 - 44 U/L 29 30 43    DIAGNOSTIC IMAGING:  I have independently reviewed the scans and discussed with the patient.   ASSESSMENT:  1.  Recurrent small cell lung cancer: -Treated for limited stage small cell lung cancer with chemoradiation with cisplatin and etoposide therapy on 06/21/2016 followed by PCI. -CT scan on 04/17/2019 showed worsening right lung nodules.  Left lower lobe infectious cavitary lesion was decreased in size.  New right middle lobe lung nodule present. -2 cycles of carboplatin, VP-16 and atezolizumab on 05/06/2019 and 05/27/2019.  2.  Brain metastasis: -MRI of the brain on 05/13/2019 showed small 7 mm ring enhancing lesion at the junction of the left posterior temporal and anterior occipital lobes compatible with solitary metastasis.  Typical appearance of lacunar infarct is also considered. -We will plan to repeat in 2 to 3  months.   PLAN:  1.  Recurrent small cell lung cancer: -He has tolerated last cycle very well.  He was not hospitalized. -I have reviewed his labs.  White count, platelet count and LFTs are adequate to proceed with his next treatment. -I plan to bring him back in 1 to 2 weeks for fluids again to prevent any hospitalization. -We will plan to repeat CT CAP and brain MRI prior to next visit in 3 weeks.  2.  CKD: -Creatinine has improved to 1.12.  It was elevated at 1.6 on 06/10/2019.  3.  Diabetes: -Continue Lantus 20 units twice daily and NovoLog sliding scale.  4.  Atrial fibrillation: -Continue Eliquis 5 mg twice daily.  5.  Low back pain: -Continue  hydrocodone 5/325 at bedtime as needed.    Orders placed this encounter:  No orders of the defined types were placed in this encounter.    Derek Jack, MD [facility] [phone]   I, Jacqualyn Posey, am acting as a scribe for Dr. Sanda Linger.  I, Derek Jack MD, have reviewed the above documentation for accuracy and completeness, and I agree with the above.

## 2019-06-17 NOTE — Progress Notes (Signed)
Patient has been assessed, vital signs and labs have been reviewed by Dr. Katragadda. ANC, Creatinine, LFTs, and Platelets are within treatment parameters per Dr. Katragadda. The patient is good to proceed with treatment at this time.  

## 2019-06-17 NOTE — Patient Instructions (Signed)
[facility]  You were seen today by Dr. Delton Coombes. He went over your recent results. You will come in next week as scheduled. You will see, Randi, the nurse practitioner, in two weeks. Dr. Delton Coombes will see you back in 3 weeks for labs and follow up.   Thank you for choosing [facility] to provide your oncology and hematology care.  To afford each patient quality time with our provider, please arrive at least 15 minutes before your scheduled appointment time.   If you have a lab appointment with the [facility] please come in thru the  Main Entrance and check in at the main information desk  You need to re-schedule your appointment should you arrive 10 or more minutes late.  We strive to give you quality time with our providers, and arriving late affects you and other patients whose appointments are after yours.  Also, if you no show three or more times for appointments you may be dismissed from the clinic at the providers discretion.     Again, thank you for choosing [facility].  Our hope is that these requests will decrease the amount of time that you wait before being seen by our physicians.       _____________________________________________________________  Should you have questions after your visit to [facility], please contact our office at [phone] between the hours of 8:00 a.m. and 4:30 p.m.  Voicemails left after 4:00 p.m. will not be returned until the following business day.  For prescription refill requests, have your pharmacy contact our office and allow 72 hours.    Cancer Center Support Programs:   > Cancer Support Group  2nd Tuesday of the month 1pm-2pm, Journey Room

## 2019-06-18 ENCOUNTER — Inpatient Hospital Stay (HOSPITAL_COMMUNITY): Payer: Medicare Other

## 2019-06-18 ENCOUNTER — Encounter (HOSPITAL_COMMUNITY): Payer: Self-pay

## 2019-06-18 VITALS — BP 124/61 | HR 91 | Temp 97.1°F | Resp 18

## 2019-06-18 DIAGNOSIS — C3491 Malignant neoplasm of unspecified part of right bronchus or lung: Secondary | ICD-10-CM

## 2019-06-18 DIAGNOSIS — Z5112 Encounter for antineoplastic immunotherapy: Secondary | ICD-10-CM | POA: Diagnosis not present

## 2019-06-18 MED ORDER — SODIUM CHLORIDE 0.9% FLUSH
10.0000 mL | INTRAVENOUS | Status: DC | PRN
  Administered 2019-06-18: 10 mL

## 2019-06-18 MED ORDER — SODIUM CHLORIDE 0.9 % IV SOLN
10.0000 mg | Freq: Once | INTRAVENOUS | Status: AC
  Administered 2019-06-18: 10 mg via INTRAVENOUS
  Filled 2019-06-18: qty 10

## 2019-06-18 MED ORDER — SODIUM CHLORIDE 0.9 % IV SOLN: Freq: Once | INTRAVENOUS | Status: AC

## 2019-06-18 MED ORDER — HEPARIN SOD (PORK) LOCK FLUSH 100 UNIT/ML IV SOLN
500.0000 [IU] | Freq: Once | INTRAVENOUS | Status: AC | PRN
  Administered 2019-06-18: 500 [IU]

## 2019-06-18 MED ORDER — SODIUM CHLORIDE 0.9 % IV SOLN
80.0000 mg/m2 | Freq: Once | INTRAVENOUS | Status: AC
  Administered 2019-06-18: 190 mg via INTRAVENOUS
  Filled 2019-06-18: qty 9.5

## 2019-06-18 NOTE — Progress Notes (Signed)
Patient tolerated chemotherapy with no complaints voiced.  Side effects with management reviewed with understanding verbalized.  Port site clean and dry with no bruising or swelling noted at site.  Good blood return noted before and after administration of chemotherapy.  Band aid applied.  Patient left by wheelchair with VSS and no s/s of distress noted.

## 2019-06-19 ENCOUNTER — Other Ambulatory Visit: Payer: Self-pay

## 2019-06-19 ENCOUNTER — Encounter (HOSPITAL_COMMUNITY): Payer: Self-pay

## 2019-06-19 ENCOUNTER — Inpatient Hospital Stay (HOSPITAL_COMMUNITY): Payer: Medicare Other

## 2019-06-19 VITALS — BP 124/57 | HR 80 | Temp 97.9°F | Resp 18

## 2019-06-19 DIAGNOSIS — C3491 Malignant neoplasm of unspecified part of right bronchus or lung: Secondary | ICD-10-CM

## 2019-06-19 DIAGNOSIS — Z5112 Encounter for antineoplastic immunotherapy: Secondary | ICD-10-CM | POA: Diagnosis not present

## 2019-06-19 MED ORDER — SODIUM CHLORIDE 0.9% FLUSH
10.0000 mL | INTRAVENOUS | Status: DC | PRN
  Administered 2019-06-19: 10 mL

## 2019-06-19 MED ORDER — HEPARIN SOD (PORK) LOCK FLUSH 100 UNIT/ML IV SOLN
500.0000 [IU] | Freq: Once | INTRAVENOUS | Status: AC | PRN
  Administered 2019-06-19: 500 [IU]

## 2019-06-19 MED ORDER — SODIUM CHLORIDE 0.9 % IV SOLN
10.0000 mg | Freq: Once | INTRAVENOUS | Status: AC
  Administered 2019-06-19: 10 mg via INTRAVENOUS
  Filled 2019-06-19: qty 10

## 2019-06-19 MED ORDER — SODIUM CHLORIDE 0.9 % IV SOLN: Freq: Once | INTRAVENOUS | Status: AC

## 2019-06-19 MED ORDER — SODIUM CHLORIDE 0.9 % IV SOLN
80.0000 mg/m2 | Freq: Once | INTRAVENOUS | Status: AC
  Administered 2019-06-19: 190 mg via INTRAVENOUS
  Filled 2019-06-19: qty 9.5

## 2019-06-19 NOTE — Progress Notes (Signed)
Chase Herrera tolerated VP -16 infusion well without complaints or incident. VSS upon discharge. Pt discharged via wheelchair in satisfactory condition

## 2019-06-19 NOTE — Patient Instructions (Signed)
[facility] Discharge Instructions for Patients Receiving Chemotherapy   Beginning January 23rd 2017 lab work for the Cancer Center will be done in the  Main lab at [facility] on 1st floor. If you have a lab appointment with the Cancer Center please come in thru the  Main Entrance and check in at the main information desk   Today you received the following chemotherapy agents VP-16.Follow-up as scheduled  To help prevent nausea and vomiting after your treatment, we encourage you to take your nausea medication    If you develop nausea and vomiting, or diarrhea that is not controlled by your medication, call the clinic.  The clinic phone number is [phone]. Office hours are Monday-Friday 8:30am-5:00pm.  BELOW ARE SYMPTOMS THAT SHOULD BE REPORTED IMMEDIATELY:  *FEVER GREATER THAN 101.0 F  *CHILLS WITH OR WITHOUT FEVER  NAUSEA AND VOMITING THAT IS NOT CONTROLLED WITH YOUR NAUSEA MEDICATION  *UNUSUAL SHORTNESS OF BREATH  *UNUSUAL BRUISING OR BLEEDING  TENDERNESS IN MOUTH AND THROAT WITH OR WITHOUT PRESENCE OF ULCERS  *URINARY PROBLEMS  *BOWEL PROBLEMS  UNUSUAL RASH Items with * indicate a potential emergency and should be followed up as soon as possible. If you have an emergency after office hours please contact your primary care physician or go to the nearest emergency department.  Please call the clinic during office hours if you have any questions or concerns.   You may also contact the Patient Navigator at [phone] should you have any questions or need assistance in obtaining follow up care.      Resources For Cancer Patients and their Caregivers ? American Cancer Society: Can assist with transportation, wigs, general needs, runs Look Good Feel Better.        1-888-227-6333 ? Cancer Care: Provides financial assistance, online support groups, medication/co-pay assistance.  1-800-813-HOPE (4673) ? [facility] Assists [place] cancer patients and their families through emotional , educational and financial support.  336-427-4357 ? [place] DSS Where to apply for food stamps, Medicaid and utility assistance. 336-342-1394 ? [facility]: Transportation to medical appointments. 336-347-2287 ? Social Security Administration: May apply for disability if have a Stage IV cancer. 336-342-7796 1-800-772-1213 ? [place] Aging, Disability and Transit Services: Assists with nutrition, care and transit needs. 336-349-2343         

## 2019-06-23 ENCOUNTER — Other Ambulatory Visit: Payer: Self-pay

## 2019-06-23 ENCOUNTER — Inpatient Hospital Stay (HOSPITAL_COMMUNITY): Payer: Medicare Other | Attending: Hematology

## 2019-06-23 ENCOUNTER — Encounter (HOSPITAL_COMMUNITY): Payer: Self-pay

## 2019-06-23 VITALS — BP 115/65 | HR 85 | Temp 97.6°F | Resp 20

## 2019-06-23 DIAGNOSIS — E669 Obesity, unspecified: Secondary | ICD-10-CM | POA: Diagnosis not present

## 2019-06-23 DIAGNOSIS — Z5111 Encounter for antineoplastic chemotherapy: Secondary | ICD-10-CM | POA: Insufficient documentation

## 2019-06-23 DIAGNOSIS — R079 Chest pain, unspecified: Secondary | ICD-10-CM | POA: Diagnosis not present

## 2019-06-23 DIAGNOSIS — Z803 Family history of malignant neoplasm of breast: Secondary | ICD-10-CM | POA: Insufficient documentation

## 2019-06-23 DIAGNOSIS — I700 Atherosclerosis of aorta: Secondary | ICD-10-CM | POA: Diagnosis not present

## 2019-06-23 DIAGNOSIS — Z833 Family history of diabetes mellitus: Secondary | ICD-10-CM | POA: Insufficient documentation

## 2019-06-23 DIAGNOSIS — I509 Heart failure, unspecified: Secondary | ICD-10-CM | POA: Diagnosis not present

## 2019-06-23 DIAGNOSIS — M545 Low back pain: Secondary | ICD-10-CM | POA: Insufficient documentation

## 2019-06-23 DIAGNOSIS — R05 Cough: Secondary | ICD-10-CM | POA: Insufficient documentation

## 2019-06-23 DIAGNOSIS — Z801 Family history of malignant neoplasm of trachea, bronchus and lung: Secondary | ICD-10-CM | POA: Insufficient documentation

## 2019-06-23 DIAGNOSIS — Z7901 Long term (current) use of anticoagulants: Secondary | ICD-10-CM | POA: Insufficient documentation

## 2019-06-23 DIAGNOSIS — C3491 Malignant neoplasm of unspecified part of right bronchus or lung: Secondary | ICD-10-CM

## 2019-06-23 DIAGNOSIS — C3411 Malignant neoplasm of upper lobe, right bronchus or lung: Secondary | ICD-10-CM | POA: Insufficient documentation

## 2019-06-23 DIAGNOSIS — J449 Chronic obstructive pulmonary disease, unspecified: Secondary | ICD-10-CM | POA: Insufficient documentation

## 2019-06-23 DIAGNOSIS — I4891 Unspecified atrial fibrillation: Secondary | ICD-10-CM | POA: Diagnosis not present

## 2019-06-23 DIAGNOSIS — N189 Chronic kidney disease, unspecified: Secondary | ICD-10-CM | POA: Diagnosis not present

## 2019-06-23 DIAGNOSIS — Z5112 Encounter for antineoplastic immunotherapy: Secondary | ICD-10-CM | POA: Insufficient documentation

## 2019-06-23 DIAGNOSIS — F329 Major depressive disorder, single episode, unspecified: Secondary | ICD-10-CM | POA: Insufficient documentation

## 2019-06-23 DIAGNOSIS — Z87442 Personal history of urinary calculi: Secondary | ICD-10-CM | POA: Insufficient documentation

## 2019-06-23 DIAGNOSIS — R0602 Shortness of breath: Secondary | ICD-10-CM | POA: Diagnosis not present

## 2019-06-23 DIAGNOSIS — C3490 Malignant neoplasm of unspecified part of unspecified bronchus or lung: Secondary | ICD-10-CM

## 2019-06-23 DIAGNOSIS — C7931 Secondary malignant neoplasm of brain: Secondary | ICD-10-CM | POA: Insufficient documentation

## 2019-06-23 DIAGNOSIS — Z836 Family history of other diseases of the respiratory system: Secondary | ICD-10-CM | POA: Insufficient documentation

## 2019-06-23 DIAGNOSIS — E1122 Type 2 diabetes mellitus with diabetic chronic kidney disease: Secondary | ICD-10-CM | POA: Diagnosis not present

## 2019-06-23 DIAGNOSIS — R200 Anesthesia of skin: Secondary | ICD-10-CM | POA: Insufficient documentation

## 2019-06-23 DIAGNOSIS — Z5189 Encounter for other specified aftercare: Secondary | ICD-10-CM | POA: Insufficient documentation

## 2019-06-23 DIAGNOSIS — Z8249 Family history of ischemic heart disease and other diseases of the circulatory system: Secondary | ICD-10-CM | POA: Insufficient documentation

## 2019-06-23 DIAGNOSIS — Z87891 Personal history of nicotine dependence: Secondary | ICD-10-CM | POA: Insufficient documentation

## 2019-06-23 DIAGNOSIS — Z79899 Other long term (current) drug therapy: Secondary | ICD-10-CM | POA: Insufficient documentation

## 2019-06-23 MED ORDER — SODIUM CHLORIDE 0.9 % IV SOLN: Freq: Once | INTRAVENOUS | Status: AC

## 2019-06-23 MED ORDER — IPRATROPIUM-ALBUTEROL 0.5-2.5 (3) MG/3ML IN SOLN
RESPIRATORY_TRACT | Status: AC
  Filled 2019-06-23: qty 3

## 2019-06-23 MED ORDER — IPRATROPIUM-ALBUTEROL 0.5-2.5 (3) MG/3ML IN SOLN
3.0000 mL | Freq: Once | RESPIRATORY_TRACT | Status: AC
  Administered 2019-06-23: 3 mL via RESPIRATORY_TRACT

## 2019-06-23 MED ORDER — HEPARIN SOD (PORK) LOCK FLUSH 100 UNIT/ML IV SOLN
500.0000 [IU] | Freq: Once | INTRAVENOUS | Status: AC
  Administered 2019-06-23: 500 [IU] via INTRAVENOUS

## 2019-06-23 MED ORDER — PEGFILGRASTIM-CBQV 6 MG/0.6ML ~~LOC~~ SOSY
6.0000 mg | PREFILLED_SYRINGE | Freq: Once | SUBCUTANEOUS | Status: AC
  Administered 2019-06-23: 6 mg via SUBCUTANEOUS
  Filled 2019-06-23: qty 0.6

## 2019-06-23 NOTE — Progress Notes (Signed)
Patient requesting duoneb treatment and did not want to wait to get home for treatment if he could receive it in the cancer center.  No s/s of distress noted.  Vital signs stable.  Duoneb treatment once verbal order Dr. Delton Coombes.    Patient tolerated hydration and injection with no complaints voiced.  Port site clean and dry with good blood return noted before and after hydration.  No bruising or swelling noted with port.  Band aids applied.  VSS with discharge and left by wheelchair with no s/s of distress noted.

## 2019-07-01 ENCOUNTER — Ambulatory Visit (HOSPITAL_COMMUNITY)
Admission: RE | Admit: 2019-07-01 | Discharge: 2019-07-01 | Disposition: A | Discharge status: Home / Self Care | Payer: Medicare Other | Source: Ambulatory Visit | Attending: Nurse Practitioner | Admitting: Nurse Practitioner

## 2019-07-01 ENCOUNTER — Inpatient Hospital Stay (HOSPITAL_COMMUNITY): Payer: Medicare Other

## 2019-07-01 ENCOUNTER — Other Ambulatory Visit: Payer: Self-pay

## 2019-07-01 ENCOUNTER — Inpatient Hospital Stay (HOSPITAL_BASED_OUTPATIENT_CLINIC_OR_DEPARTMENT_OTHER): Payer: Medicare Other | Admitting: Nurse Practitioner

## 2019-07-01 VITALS — BP 104/71 | HR 91 | Temp 97.7°F | Resp 18 | Wt 247.0 lb

## 2019-07-01 VITALS — BP 110/56 | HR 82 | Temp 97.3°F | Resp 18

## 2019-07-01 DIAGNOSIS — C3491 Malignant neoplasm of unspecified part of right bronchus or lung: Secondary | ICD-10-CM

## 2019-07-01 DIAGNOSIS — C7931 Secondary malignant neoplasm of brain: Secondary | ICD-10-CM | POA: Diagnosis not present

## 2019-07-01 DIAGNOSIS — Z5111 Encounter for antineoplastic chemotherapy: Secondary | ICD-10-CM | POA: Diagnosis not present

## 2019-07-01 DIAGNOSIS — R079 Chest pain, unspecified: Secondary | ICD-10-CM

## 2019-07-01 DIAGNOSIS — Z5112 Encounter for antineoplastic immunotherapy: Secondary | ICD-10-CM | POA: Diagnosis not present

## 2019-07-01 DIAGNOSIS — C3490 Malignant neoplasm of unspecified part of unspecified bronchus or lung: Secondary | ICD-10-CM | POA: Diagnosis not present

## 2019-07-01 DIAGNOSIS — E860 Dehydration: Secondary | ICD-10-CM

## 2019-07-01 DIAGNOSIS — C3411 Malignant neoplasm of upper lobe, right bronchus or lung: Secondary | ICD-10-CM | POA: Diagnosis not present

## 2019-07-01 LAB — COMPREHENSIVE METABOLIC PANEL
ALT: 32 U/L (ref 0–44)
AST: 19 U/L (ref 15–41)
Albumin: 3.2 g/dL — ABNORMAL LOW (ref 3.5–5.0)
Alkaline Phosphatase: 83 U/L (ref 38–126)
Anion gap: 10 (ref 5–15)
BUN: 14 mg/dL (ref 8–23)
CO2: 30 mmol/L (ref 22–32)
Calcium: 8.7 mg/dL — ABNORMAL LOW (ref 8.9–10.3)
Chloride: 94 mmol/L — ABNORMAL LOW (ref 98–111)
Creatinine, Ser: 0.99 mg/dL (ref 0.61–1.24)
GFR calc Af Amer: >60 mL/min (ref >60)
GFR calc non Af Amer: >60 mL/min (ref >60)
Glucose, Bld: 299 mg/dL — ABNORMAL HIGH (ref 70–99)
Potassium: 4.1 mmol/L (ref 3.5–5.1)
Sodium: 134 mmol/L — ABNORMAL LOW (ref 135–145)
Total Bilirubin: 0.5 mg/dL (ref 0.3–1.2)
Total Protein: 6.1 g/dL — ABNORMAL LOW (ref 6.5–8.1)

## 2019-07-01 LAB — CBC WITH DIFFERENTIAL/PLATELET
Abs Immature Granulocytes: 0.35 10*3/uL — ABNORMAL HIGH (ref 0.00–0.07)
Basophils Absolute: 0.1 10*3/uL (ref 0.0–0.1)
Basophils Relative: 1 %
Eosinophils Absolute: 0 10*3/uL (ref 0.0–0.5)
Eosinophils Relative: 0 %
HCT: 31.4 % — ABNORMAL LOW (ref 39.0–52.0)
Hemoglobin: 9.5 g/dL — ABNORMAL LOW (ref 13.0–17.0)
Immature Granulocytes: 3 %
Lymphocytes Relative: 5 %
Lymphs Abs: 0.5 10*3/uL — ABNORMAL LOW (ref 0.7–4.0)
MCH: 31.5 pg (ref 26.0–34.0)
MCHC: 30.3 g/dL (ref 30.0–36.0)
MCV: 104 fL — ABNORMAL HIGH (ref 80.0–100.0)
Monocytes Absolute: 1.1 10*3/uL — ABNORMAL HIGH (ref 0.1–1.0)
Monocytes Relative: 10 %
Neutro Abs: 8.7 10*3/uL — ABNORMAL HIGH (ref 1.7–7.7)
Neutrophils Relative %: 81 %
Platelets: 63 10*3/uL — ABNORMAL LOW (ref 150–400)
RBC: 3.02 MIL/uL — ABNORMAL LOW (ref 4.22–5.81)
RDW: 18.3 % — ABNORMAL HIGH (ref 11.5–15.5)
WBC: 10.8 10*3/uL — ABNORMAL HIGH (ref 4.0–10.5)
nRBC: 0.3 % — ABNORMAL HIGH (ref 0.0–0.2)

## 2019-07-01 LAB — CK TOTAL AND CKMB (NOT AT ARMC)
CK, MB: 4.5 ng/mL (ref 0.5–5.0)
Relative Index: INVALID (ref 0.0–2.5)
Total CK: 52 U/L (ref 49–397)

## 2019-07-01 LAB — LACTATE DEHYDROGENASE: LDH: 188 U/L (ref 98–192)

## 2019-07-01 LAB — TROPONIN I (HIGH SENSITIVITY): Troponin I (High Sensitivity): 5 ng/L (ref <18)

## 2019-07-01 LAB — MAGNESIUM: Magnesium: 1.5 mg/dL — ABNORMAL LOW (ref 1.7–2.4)

## 2019-07-01 MED ORDER — HEPARIN SOD (PORK) LOCK FLUSH 100 UNIT/ML IV SOLN
500.0000 [IU] | Freq: Once | INTRAVENOUS | Status: AC
  Administered 2019-07-01: 500 [IU] via INTRAVENOUS

## 2019-07-01 MED ORDER — SODIUM CHLORIDE 0.9% FLUSH
10.0000 mL | Freq: Once | INTRAVENOUS | Status: AC
  Administered 2019-07-01: 10 mL

## 2019-07-01 MED ORDER — SODIUM CHLORIDE 0.9% IV SOLUTION
1000.0000 mL | Freq: Once | INTRAVENOUS | Status: AC
  Administered 2019-07-01: 1000 mL via INTRAVENOUS

## 2019-07-01 MED ORDER — BENZONATATE 200 MG PO CAPS: 200.0000 mg | ORAL_CAPSULE | Freq: Three times a day (TID) | ORAL | 0 refills | Status: DC | PRN

## 2019-07-01 MED ORDER — MAGNESIUM SULFATE 2 GM/50ML IV SOLN
2.0000 g | Freq: Once | INTRAVENOUS | Status: AC
  Administered 2019-07-01: 2 g via INTRAVENOUS
  Filled 2019-07-01: qty 50

## 2019-07-01 NOTE — Patient Instructions (Signed)
[facility]  Discharge Instructions:   _______________________________________________________________  Thank you for choosing [facility] to provide your oncology and hematology care.  To afford each patient quality time with our providers, please arrive at least 15 minutes before your scheduled appointment.  You need to re-schedule your appointment if you arrive 10 or more minutes late.  We strive to give you quality time with our providers, and arriving late affects you and other patients whose appointments are after yours.  Also, if you no show three or more times for appointments you may be dismissed from the clinic.  Again, thank you for choosing [facility]. Our hope is that these requests will allow you access to exceptional care and in a timely manner. _______________________________________________________________  If you have questions after your visit, please contact our office at [phone] between the hours of 8:30 a.m. and 5:00 p.m. Voicemails left after 4:30 p.m. will not be returned until the following business day. _______________________________________________________________  For prescription refill requests, have your pharmacy contact our office. _______________________________________________________________  Recommendations made by the consultant and any test results will be sent to your referring physician. _______________________________________________________________ 

## 2019-07-01 NOTE — Progress Notes (Signed)
07/01/19  Orders received for:  NS 1000 ml over 2 hours x 1 Magnesium sulfate 2 gm IVPB over 60 minutes x 1 dose.  T.O. Randi Lockamy NP/[facility] Maybee Ronnald Ramp, PharmD

## 2019-07-01 NOTE — Progress Notes (Signed)
Patient presents today for office visit with RLockamy NP. Verbal orders received to draw troponin, ck, and ckmb. Vital signs stable. Patient has complaints of chest pain that he rates a 7/10.   Verbal order to infuse 1 Liter of Normal Saline over 2 hours. Infuse 2 Grams of Magnesium Sulfate.   Hydration given today per MD orders. Tolerated infusion without adverse affects. Vital signs stable. No complaints at this time. Discharged from clinic via wheel chair. F/U with [facility] as scheduled.

## 2019-07-01 NOTE — Assessment & Plan Note (Addendum)
1.  Recurrent small cell lung cancer: -Cycle 1 of carboplatin, VP-16 and atezolizumab on 05/06/2019 with Neulasta. -Hospitalized from 05/15/2019 through 05/18/2019 with shortness of breath and neutropenia.  He was treated for pneumonia with antibiotics. -MRI of the brain on 05/13/2019 showed small 7 mm rim-enhancing lesion at the junction of the left posterior temporal and anterior occipital lobes compatible with solitary metastasis.  The typical appearance of lacunar infarct is also considered. -He received cycle 2 of chemotherapy on 05/27/2019 the carboplatin was cut back to AUC 4 and etoposide to 80 mg/m.  He received the Neulasta on 06/01/2019. -He is being followed closely due to hospitalizations after treatments. -Labs done on 07/01/2019 showed creatinine 0.99, potassium 4.1, glucose 299, WBC 10.8, hemoglobin 9.5, platelets 63, magnesium 1.5. -We will give him 1 L of normal saline.  We will also give him 2 g of magnesium. -Patient came in with chest pain.  EKG on 07/01/2019 showed normal sinus rhythm.  Troponin came back at 5, CK came back at 52.  MB pending.  Patient reports chest pain has resolved.  Chest x-ray completed on 07/01/2019 did not show any infectious process.  Most likely due to him straining when coughing.  His Tessalon Perles were refilled at his pharmacy. -He will return in 1 week for repeat labs and possible treatment.  Plan is to repeat scans after cycle 3.  2.  Diabetes: -He is taking Lantus 20 units twice daily.  He uses NovoLog sliding scale at home. -Today on 07/01/2019 his blood sugar is 229 -We will also give him 1 L of normal saline.  3.  Atrial fibrillation: -Continue Eliquis 5 mg twice daily. -No bleeding issues.  4.  Lower back pain: -She will continue hydrocodone 5/325 at bedtime as needed

## 2019-07-01 NOTE — Progress Notes (Signed)
[address] Her[facility] [place], [place] [address]   CLINIC:  Medical Oncology/Hematology  PCP:  [facility], [facility] [place] [address] [phone]   REASON FOR VISIT: Follow-up for small cell lung cancer    BRIEF ONCOLOGIC HISTORY:  Oncology History  Small cell lung cancer ([place])  04/03/2016 Initial Diagnosis   Small cell lung cancer ([place])   04/09/2016 Imaging   MRI brain- Negative for metastatic disease to the brain. No acute abnormality.   04/09/2016 Procedure   Right IJ port catheter placement by IR   04/13/2016 PET scan   . The right apical lung mass has significantly enlarged and there is new right paratracheal adenopathy as well as a hypermetabolic new lymph node in the right inner infraclavicular fossa potentially with early impingement on adjacent neurovascular structures. 2. Other imaging findings of potential clinical significance: Chronic bilateral maxillary sinusitis. Left carotid atherosclerotic calcification. Aortoiliac atherosclerotic vascular disease. Coronary atherosclerosis. Paraseptal emphysema. Mild atelectasis in the lung bases. Scattered sigmoid colon diverticula. Probable right pars defect at L5.   05/09/2016 - 07/12/2016 Radiation Therapy   Radiotherapy to right Lung+MS 63 Gy/35 fractions at 1.8 GY/f using 6 x photons 3- D CRT technique Dates: 4/18-6/21/18       06/19/2016 Treatment Plan Change   Cisplatin dose reduced by 20% for cycle #4 due to severe thrombocytopenia following cycle #3 (13,000).   08/21/2016 PET scan   1. Marked reduction in size and metabolic activity of RIGHT upper lobe mass. 2. Resolution of mediastinal and RIGHT supraclavicular lymphadenopathy. 3. No evidence of disease progression.   05/06/2019 -  Chemotherapy   The patient had palonosetron (ALOXI) injection 0.25 mg, 0.25 mg, Intravenous,  Once, 3 of 4 cycles Administration: 0.25 mg (05/06/2019), 0.25 mg (05/27/2019),  0.25 mg (06/17/2019) pegfilgrastim-cbqv (UDENYCA) injection 6 mg, 6 mg, Subcutaneous, Once, 3 of 4 cycles Administration: 6 mg (05/11/2019), 6 mg (06/01/2019), 6 mg (06/23/2019) CARBOplatin (PARAPLATIN) 450 mg in sodium chloride 0.9 % 250 mL chemo infusion, 450 mg (100 % of original dose 446.4 mg), Intravenous,  Once, 3 of 4 cycles Dose modification:   (original dose 446.4 mg, Cycle 1, Reason: Provider Judgment),   (original dose 500 mg, Cycle 2, Reason: Provider Judgment), 500 mg (original dose 500 mg, Cycle 2, Reason: Provider Judgment), 500 mg (original dose 500 mg, Cycle 3) Administration: 450 mg (05/06/2019), 500 mg (05/27/2019), 500 mg (06/17/2019) etoposide (VEPESID) 190 mg in sodium chloride 0.9 % 500 mL chemo infusion, 80 mg/m2 = 190 mg (80 % of original dose 100 mg/m2), Intravenous,  Once, 3 of 4 cycles Dose modification: 80 mg/m2 (80 % of original dose 100 mg/m2, Cycle 1, Reason: Provider Judgment), 80 mg/m2 (original dose 100 mg/m2, Cycle 1, Reason: Provider Judgment), 80 mg/m2 (80 % of original dose 100 mg/m2, Cycle 2, Reason: Provider Judgment) Administration: 190 mg (05/06/2019), 190 mg (05/07/2019), 190 mg (05/08/2019), 190 mg (05/27/2019), 190 mg (05/28/2019), 190 mg (05/29/2019), 190 mg (06/17/2019), 190 mg (06/18/2019), 190 mg (06/19/2019) fosaprepitant (EMEND) 150 mg in sodium chloride 0.9 % 145 mL IVPB, 150 mg, Intravenous,  Once, 3 of 4 cycles Administration: 150 mg (05/06/2019), 150 mg (05/27/2019), 150 mg (06/17/2019) atezolizumab (TECENTRIQ) 1,200 mg in sodium chloride 0.9 % 250 mL chemo infusion, 1,200 mg, Intravenous, Once, 3 of 8 cycles Administration: 1,200 mg (05/06/2019), 1,200 mg (05/27/2019), 1,200 mg (06/17/2019)  for chemotherapy treatment.      CANCER STAGING: Cancer Staging Small cell lung cancer ([place]) Staging  form: Lung, AJCC 8th Edition - Clinical: Stage IIIC (cT3, cN3, cM0) - Signed by Twana First, MD on 04/16/2016    INTERVAL HISTORY:  Mr. Mccaster 69 y.o. male returns for routine  follow-up for small cell lung cancer.  Patient reports initially he had chest pain.  As the appointment went on his chest pain resolved.  He does feel fatigued and weak and is coughing more than usual. Denies any nausea, vomiting, or diarrhea. Denies any new pains. Had not noticed any recent bleeding such as epistaxis, hematuria or hematochezia. Denies recent chest pain on exertion, shortness of breath on minimal exertion, pre-syncopal episodes, or palpitations. Denies any numbness or tingling in hands or feet. Denies any recent fevers, infections, or recent hospitalizations. Patient reports appetite at 100% and energy level at 50%.    REVIEW OF SYSTEMS:  Review of Systems  Respiratory: Positive for cough and shortness of breath.   Cardiovascular: Positive for chest pain.  All other systems reviewed and are negative.    PAST MEDICAL/SURGICAL HISTORY:  Past Medical History:  Diagnosis Date   Alcohol abuse    Quit 08/23/11   Arthritis    Atrial fibrillation ([place])    Atrial flutter ([place])    CTI ablation by Dr [name] 10/2011   Bacteremia due to Gram-positive bacteria 11/29/2016   CHF (congestive heart failure) ([facility])    COPD (chronic obstructive pulmonary disease) ([place])    Depression    Essential hypertension    History of cardiomyopathy    LVEF 25-30% 08/2011 with subsequent normalization   History of kidney stones    Obesity    Prostate enlargement    Small cell lung cancer ([place])    Right upper lobe - follows with [facility]   Type 2 diabetes mellitus [facility])    Past Surgical History:  Procedure Laterality Date   APPENDECTOMY     ATRIAL ABLATION SURGERY  11/06/2011   CTI ablation for atrial flutter by Dr [name]   ATRIAL FLUTTER ABLATION N/A 11/06/2011   Procedure: ATRIAL FLUTTER ABLATION;  Surgeon: Thompson Grayer, MD;  Location: [facility] CATH LAB;  Service: Cardiovascular;  Laterality: N/A;   Cataracts     COLONOSCOPY WITH PROPOFOL N/A 06/06/2017   Procedure:  COLONOSCOPY WITH PROPOFOL;  Surgeon: [name], MD;  Location: [facility] ENDO SUITE;  Service: Endoscopy;  Laterality: N/A;  1:15pm   IR GENERIC HISTORICAL  04/09/2016   IR US GUIDE VASC ACCESS RIGHT 04/09/2016 [name], DO [facility]-INTERV RAD   IR GENERIC HISTORICAL  04/09/2016   IR FLUORO GUIDE PORT INSERTION RIGHT 04/09/2016 [name], DO [facility]-INTER[facility] RAD   PORT-A-CATH REMOVAL N/A 04/24/2017   Procedure: MINOR REMOVAL PORT-A-CATH;  Surgeon: [name], MD;  Location: [facility] ORS;  Service: General;  Laterality: N/A;   PORTACATH PLACEMENT Left 04/22/2019   Procedure: INSERTION PORT-A-CATH;  Surgeon: Aviva Signs, MD;  Location: [facility] ORS;  Service: General;  Laterality: Left;   TONSILLECTOMY       SOCIAL HISTORY:  Social History   Socioeconomic History   Marital status: Single    Spouse name: Not on file   Number of children: Not on file   Years of education: Not on file   Highest education level: Not on file  Occupational History   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   Social Determinants of Radio broadcast assistant Strain:    Difficulty of Paying Living Expenses:   Food Insecurity:    Worried About Charity fundraiser in the Last Year:    Arboriculturist in the Last Year:   Transportation Needs:    Film/video editor  (Medical):    Lack of Transportation (Non-Medical):   Physical Activity:    Days of Exercise per Week:    Minutes of Exercise per Session:   Stress:    Feeling of Stress :   Social Connections:    Frequency of Communication with Friends and Family:    Frequency of Social Gatherings with Friends and Family:    Attends Religious Services:    Active Member of Clubs or Organizations:    Attends Music therapist:    Marital Status:   Intimate Partner Violence:    Fear of Current or Ex-Partner:    Emotionally Abused:    Physically Abused:    Sexually Abused:     FAMILY HISTORY:  Family History  Problem Relation Age of Onset   Diabetes Sister    Hypertension Mother    Lung cancer Mother    Hypertension Father    Heart attack Father    Hypertension Sister    Hypertension Sister    Hypertension Sister    Pulmonary fibrosis Sister    Breast cancer Cousin    Colon cancer Neg Hx     CURRENT MEDICATIONS:  Outpatient Encounter Medications as of 07/01/2019  Medication Sig   apixaban (ELIQUIS) 5 MG TABS tablet Take 5 mg by mouth 2 (two) times daily.    AQUALANCE LANCETS 30G MISC USE TO check blood glucose twice daily   atorvastatin (LIPITOR) 20 MG tablet Take 1 tablet (20 mg total) by mouth daily.   diltiazem (DILACOR XR) 240 MG 24 hr capsule Take 240 mg by mouth daily.   dofetilide (TIKOSYN) 250 MCG capsule Take 1 capsule (250 mcg total) by mouth 2 (two) times daily.   fluticasone (FLONASE) 50 MCG/ACT nasal spray Place 2 sprays into both nostrils daily.   Fluticasone-Umeclidin-Vilant (TRELEGY ELLIPTA) 100-62.5-25 MCG/INH AEPB Inhale 1 puff into the lungs daily.   furosemide (LASIX) 40 MG tablet TAKE 1 TABLET BY MOUTH EVERY DAY   gabapentin (NEURONTIN) 300 MG capsule Take 600 mg by mouth 2 (two) times daily.    guaifenesin (ROBITUSSIN) 100 MG/5ML syrup Take 200 mg by mouth at bedtime as needed for cough.   HYDROcodone-acetaminophen  (NORCO/VICODIN) 5-325 MG tablet Take 1 tablet by mouth at bedtime as needed.   hydrOXYzine (VISTARIL) 25 MG capsule Take 25 mg by mouth every 8 (eight) hours.   insulin lispro (HUMALOG) 100 UNIT/ML injection Inject 2-20 Units into the skin 3 (three) times daily with meals. Sliding scale    Insulin Syringe-Needle U-100 28G X 1/2"  1 ML MISC 1 each by Does not apply route 3 (three) times daily.   ipratropium-albuterol (DUONEB) 0.5-2.5 (3) MG/3ML SOLN Take 3 mLs by nebulization every 4 (four) hours as needed (shortness of breath).    Lancet Devices (ADJUSTABLE LANCING DEVICE) MISC TO check blood glucose daily   LANTUS SOLOSTAR 100 UNIT/ML Solostar Pen INJECT 20 UNITS UNDER THE SKIN EVERY MORNING AND INJECT 20 UNITS UNDER THE SKIN EVERY EVENING (Patient taking differently: Inject 20 Units into the skin 2 (two) times daily. )   levalbuterol (XOPENEX) 0.63 MG/3ML nebulizer solution Inhale 3 mLs (0.63 mg total) into the lungs 4 (four) times daily.   Lidocaine (SALONPAS PAIN RELIEVING EX) Apply 1 patch topically daily.    lidocaine-prilocaine (EMLA) cream Apply a small amount to port a cath site and cover with plastic wrap 1 hour prior to chemotherapy appointments   metFORMIN (GLUCOPHAGE) 500 MG tablet Take 500 mg by mouth in the morning and at bedtime.   metoprolol succinate (TOPROL-XL) 100 MG 24 hr tablet Take 100 mg by mouth daily.   Multiple Vitamin (MULTIVITAMIN WITH MINERALS) TABS tablet Take 1 tablet by mouth daily.   potassium chloride (KLOR-CON) 10 MEQ tablet Take 10 mEq by mouth 2 (two) times daily.   predniSONE (STERAPRED UNI-PAK 48 TAB) 5 MG (48) TBPK tablet TAKE DOSE PACK AS DIRECTED PER PACKAGE INSTRUCTIONS   pregabalin (LYRICA) 150 MG capsule Take 150 mg by mouth in the morning and at bedtime.   PROAIR HFA 108 (90 Base) MCG/ACT inhaler INHALE TWO PUFFS BY MOUTH EVERY 4 HOURS AS NEEDED FOR WHEEZING   prochlorperazine (COMPAZINE) 10 MG tablet Take 1 tablet (10 mg total) by mouth  every 6 (six) hours as needed (Nausea or vomiting).   senna-docusate (SENOKOT-S) 8.6-50 MG tablet Take 2 tablets by mouth at bedtime.   spironolactone (ALDACTONE) 25 MG tablet Take 12.5 mg by mouth 2 (two) times daily.    tamsulosin (FLOMAX) 0.4 MG CAPS capsule Take 1 capsule (0.4 mg total) by mouth 2 (two) times daily after a meal.   tiotropium (SPIRIVA HANDIHALER) 18 MCG inhalation capsule Place 1 capsule (18 mcg total) into inhaler and inhale daily.   traZODone (DESYREL) 50 MG tablet Take 50 mg by mouth at bedtime.   [DISCONTINUED] amoxicillin-clavulanate (AUGMENTIN) 875-125 MG tablet SMARTSIG:1 Tablet(s) By Mouth Every 12 Hours   acetaminophen (TYLENOL) 500 MG tablet Take 1,000 mg by mouth every 6 (six) hours as needed for moderate pain or headache.   Artificial Saliva (BIOTENE DRY MOUTH MOISTURIZING) SOLN Apply 1 Dose topically as needed (dry mouth).    benzonatate (TESSALON) 200 MG capsule Take 1 capsule (200 mg total) by mouth 3 (three) times daily as needed for cough.   Carboxymethylcellul-Glycerin (LUBRICATING EYE DROPS OP) Place 1 drop into the left eye daily as needed (dry eyes).   [DISCONTINUED] benzonatate (TESSALON) 200 MG capsule Take 1 capsule (200 mg total) by mouth 3 (three) times daily as needed for cough. (Patient not taking: Reported on 07/01/2019)   No facility-administered encounter medications on file as of 07/01/2019.    ALLERGIES:  No Known Allergies   PHYSICAL EXAM:  ECOG Performance status: 1  Vitals:   07/01/19 1006  BP: 104/71  Pulse: 91  Resp: 18  Temp: 97.7 F (36.5 C)  SpO2: 96%   Filed Weights   07/01/19 1006  Weight: 247 lb (112 kg)   Physical Exam Constitutional:      Appearance: Normal appearance. He is normal weight.  Cardiovascular:  Rate and Rhythm: Normal rate and regular rhythm.     Heart sounds: Normal heart sounds.  Pulmonary:     Effort: Pulmonary effort is normal.     Breath sounds: Normal breath sounds.  Abdominal:       General: Bowel sounds are normal.     Palpations: Abdomen is soft.  Musculoskeletal:        General: Normal range of motion.  Skin:    General: Skin is warm.  Neurological:     Mental Status: He is alert and oriented to person, place, and time. Mental status is at baseline.  Psychiatric:        Mood and Affect: Mood normal.        Behavior: Behavior normal.        Thought Content: Thought content normal.        Judgment: Judgment normal.      LABORATORY DATA:  I have reviewed the labs as listed.  CBC    Component Value Date/Time   WBC 10.8 (H) 07/01/2019 0830   RBC 3.02 (L) 07/01/2019 0830   HGB 9.5 (L) 07/01/2019 0830   HCT 31.4 (L) 07/01/2019 0830   PLT 63 (L) 07/01/2019 0830   MCV 104.0 (H) 07/01/2019 0830   MCH 31.5 07/01/2019 0830   MCHC 30.3 07/01/2019 0830   RDW 18.3 (H) 07/01/2019 0830   LYMPHSABS 0.5 (L) 07/01/2019 0830   MONOABS 1.1 (H) 07/01/2019 0830   EOSABS 0.0 07/01/2019 0830   BASOSABS 0.1 07/01/2019 0830   CMP Latest Ref Rng & Units 07/01/2019 06/17/2019 06/10/2019  Glucose 70 - 99 mg/dL 299(H) 287(H) 558(HH)  BUN 8 - 23 mg/dL 14 24(H) 25(H)  Creatinine 0.61 - 1.24 mg/dL 0.99 1.12 1.60(H)  Sodium 135 - 145 mmol/L 134(L) 132(L) 129(L)  Potassium 3.5 - 5.1 mmol/L 4.1 4.5 5.2(H)  Chloride 98 - 111 mmol/L 94(L) 92(L) 88(L)  CO2 22 - 32 mmol/L 30 27 26   Calcium 8.9 - 10.3 mg/dL 8.7(L) 9.0 9.3  Total Protein 6.5 - 8.1 g/dL 6.1(L) 6.7 6.6  Total Bilirubin 0.3 - 1.2 mg/dL 0.5 0.8 0.6  Alkaline Phos 38 - 126 U/L 83 71 83  AST 15 - 41 U/L 19 20 18   ALT 0 - 44 U/L 32 29 30    DIAGNOSTIC IMAGING:  I have independently reviewed the chest x-ray and discussed with the patient.  ASSESSMENT & PLAN:  Small cell lung cancer ([place]) 1.  Recurrent small cell lung cancer: -Cycle 1 of carboplatin, VP-16 and atezolizumab on 05/06/2019 with Neulasta. -Hospitalized from 05/15/2019 through 05/18/2019 with shortness of breath and neutropenia.  He was treated for pneumonia  with antibiotics. -MRI of the brain on 05/13/2019 showed small 7 mm rim-enhancing lesion at the junction of the left posterior temporal and anterior occipital lobes compatible with solitary metastasis.  The typical appearance of lacunar infarct is also considered. -He received cycle 2 of chemotherapy on 05/27/2019 the carboplatin was cut back to AUC 4 and etoposide to 80 mg/m.  He received the Neulasta on 06/01/2019. -He is being followed closely due to hospitalizations after treatments. -Labs done on 07/01/2019 showed creatinine 0.99, potassium 4.1, glucose 299, WBC 10.8, hemoglobin 9.5, platelets 63, magnesium 1.5. -We will give him 1 L of normal saline.  We will also give him 2 g of magnesium. -Patient came in with chest pain.  EKG on 07/01/2019 showed normal sinus rhythm.  Troponin came back at 5, CK came back at 52.  MB pending.  Patient reports chest pain has resolved.  Chest x-ray completed on 07/01/2019 did not show any infectious process.  Most likely due to him straining when coughing.  His Tessalon Perles were refilled at his pharmacy. -He will return in 1 week for repeat labs and possible treatment.  Plan is to repeat scans after cycle 3.  2.  Diabetes: -He is taking Lantus 20 units twice daily.  He uses NovoLog sliding scale at home. -Today on 07/01/2019 his blood sugar is 229 -We will also give him 1 L of normal saline.  3.  Atrial fibrillation: -Continue Eliquis 5 mg twice daily. -No bleeding issues.  4.  Lower back pain: -She will continue hydrocodone 5/325 at bedtime as needed     Orders placed this encounter:  Orders Placed This Encounter  Procedures   DG Chest 2 View   Troponin I   CK total and CKMB (cardiac)not at [facility], [place] [phone]

## 2019-07-02 ENCOUNTER — Other Ambulatory Visit: Payer: Self-pay

## 2019-07-02 ENCOUNTER — Emergency Department (HOSPITAL_COMMUNITY): Payer: Medicare Other

## 2019-07-02 ENCOUNTER — Encounter (HOSPITAL_COMMUNITY): Payer: Self-pay | Admitting: Emergency Medicine

## 2019-07-02 ENCOUNTER — Emergency Department (HOSPITAL_COMMUNITY)
Admission: EM | Admit: 2019-07-02 | Discharge: 2019-07-03 | Disposition: A | Discharge status: Home / Self Care | Payer: Medicare Other | Attending: Emergency Medicine | Admitting: Emergency Medicine

## 2019-07-02 DIAGNOSIS — R509 Fever, unspecified: Secondary | ICD-10-CM

## 2019-07-02 DIAGNOSIS — I110 Hypertensive heart disease with heart failure: Secondary | ICD-10-CM | POA: Diagnosis not present

## 2019-07-02 DIAGNOSIS — E119 Type 2 diabetes mellitus without complications: Secondary | ICD-10-CM | POA: Diagnosis not present

## 2019-07-02 DIAGNOSIS — Z7902 Long term (current) use of antithrombotics/antiplatelets: Secondary | ICD-10-CM | POA: Diagnosis not present

## 2019-07-02 DIAGNOSIS — J441 Chronic obstructive pulmonary disease with (acute) exacerbation: Secondary | ICD-10-CM

## 2019-07-02 DIAGNOSIS — R05 Cough: Secondary | ICD-10-CM | POA: Diagnosis not present

## 2019-07-02 DIAGNOSIS — R531 Weakness: Secondary | ICD-10-CM | POA: Insufficient documentation

## 2019-07-02 DIAGNOSIS — I4891 Unspecified atrial fibrillation: Secondary | ICD-10-CM | POA: Insufficient documentation

## 2019-07-02 DIAGNOSIS — I5032 Chronic diastolic (congestive) heart failure: Secondary | ICD-10-CM | POA: Diagnosis not present

## 2019-07-02 DIAGNOSIS — Z794 Long term (current) use of insulin: Secondary | ICD-10-CM | POA: Insufficient documentation

## 2019-07-02 DIAGNOSIS — Z87891 Personal history of nicotine dependence: Secondary | ICD-10-CM | POA: Insufficient documentation

## 2019-07-02 LAB — URINALYSIS, ROUTINE W REFLEX MICROSCOPIC
Bacteria, UA: NONE SEEN
Bilirubin Urine: NEGATIVE
Glucose, UA: >=500 mg/dL — AB
Hgb urine dipstick: NEGATIVE
Ketones, ur: NEGATIVE mg/dL
Leukocytes,Ua: NEGATIVE
Nitrite: NEGATIVE
Protein, ur: NEGATIVE mg/dL
Specific Gravity, Urine: 1.017 (ref 1.005–1.030)
pH: 6 (ref 5.0–8.0)

## 2019-07-02 LAB — CBC
HCT: 31.6 % — ABNORMAL LOW (ref 39.0–52.0)
Hemoglobin: 9.7 g/dL — ABNORMAL LOW (ref 13.0–17.0)
MCH: 32 pg (ref 26.0–34.0)
MCHC: 30.7 g/dL (ref 30.0–36.0)
MCV: 104.3 fL — ABNORMAL HIGH (ref 80.0–100.0)
Platelets: 92 10*3/uL — ABNORMAL LOW (ref 150–400)
RBC: 3.03 MIL/uL — ABNORMAL LOW (ref 4.22–5.81)
RDW: 18.5 % — ABNORMAL HIGH (ref 11.5–15.5)
WBC: 8.3 10*3/uL (ref 4.0–10.5)
nRBC: 0.6 % — ABNORMAL HIGH (ref 0.0–0.2)

## 2019-07-02 LAB — HEPATIC FUNCTION PANEL
ALT: 30 U/L (ref 0–44)
AST: 19 U/L (ref 15–41)
Albumin: 3.3 g/dL — ABNORMAL LOW (ref 3.5–5.0)
Alkaline Phosphatase: 86 U/L (ref 38–126)
Bilirubin, Direct: 0.1 mg/dL (ref 0.0–0.2)
Indirect Bilirubin: 0.4 mg/dL (ref 0.3–0.9)
Total Bilirubin: 0.5 mg/dL (ref 0.3–1.2)
Total Protein: 6.4 g/dL — ABNORMAL LOW (ref 6.5–8.1)

## 2019-07-02 LAB — BASIC METABOLIC PANEL
Anion gap: 11 (ref 5–15)
BUN: 13 mg/dL (ref 8–23)
CO2: 29 mmol/L (ref 22–32)
Calcium: 8.8 mg/dL — ABNORMAL LOW (ref 8.9–10.3)
Chloride: 95 mmol/L — ABNORMAL LOW (ref 98–111)
Creatinine, Ser: 1.28 mg/dL — ABNORMAL HIGH (ref 0.61–1.24)
GFR calc Af Amer: >60 mL/min (ref >60)
GFR calc non Af Amer: 58 mL/min — ABNORMAL LOW (ref >60)
Glucose, Bld: 240 mg/dL — ABNORMAL HIGH (ref 70–99)
Potassium: 3.9 mmol/L (ref 3.5–5.1)
Sodium: 135 mmol/L (ref 135–145)

## 2019-07-02 LAB — LACTIC ACID, PLASMA: Lactic Acid, Venous: 1.3 mmol/L (ref 0.5–1.9)

## 2019-07-02 MED ORDER — ALBUTEROL SULFATE HFA 108 (90 BASE) MCG/ACT IN AERS
2.0000 | INHALATION_SPRAY | Freq: Once | RESPIRATORY_TRACT | Status: AC
  Administered 2019-07-02: 2 via RESPIRATORY_TRACT
  Filled 2019-07-02: qty 6.7

## 2019-07-02 NOTE — ED Triage Notes (Signed)
Per EMS, pt has had several falls the last several weeks. Pt also is currently being treated for second round of lung cancer. Productive cough this am. Pt temp 100.5 at time of arrival. Pt reports last chemo treatment x1 week.

## 2019-07-02 NOTE — ED Provider Notes (Signed)
[facility] Provider Note   CSN: [id number] Arrival date & time: 07/02/19  1639     History Chief Complaint  Patient presents with  . Weakness    Chase Herrera is a 69 y.o. male.  Patient with known history of  COPD.  And small cell cancer being followed by hematology oncology upstairs.  Patient is receiving chemo.  Patient was seen at [facility] in [place] yesterday.  Discharged home.  Patient with complaint of productive cough and needing to increase his oxygen.  Patient had a temp of 100.5 at time of arrival.  Last chemo treatment was 1 week ago.  Repeat temperature here was normal.  Patient did not feel as if he had a fever.  Patient states he had several falls but none recently.  Past medical history is significant for COPD small cell cancer congestive heart failure atrial fibrillation.  Alcohol abuse in the past but quit in 2013.  Type 2 diabetes.        Past Medical History:  Diagnosis Date  . Alcohol abuse    Quit 08/23/11  . Arthritis   . Atrial fibrillation ([place])   . Atrial flutter ([place])    CTI ablation by Dr [name] 10/2011  . Bacteremia due to Gram-positive bacteria 11/29/2016  . CHF (congestive heart failure) ([place])   . COPD (chronic obstructive pulmonary disease) ([place])   . Depression   . Essential hypertension   . History of cardiomyopathy    LVEF 25-30% 08/2011 with subsequent normalization  . History of kidney stones   . Obesity   . Prostate enlargement   . Small cell lung cancer ([place])    Right upper lobe - follows with [facility]  . Type 2 diabetes mellitus [facility])     Patient Active Problem List   Diagnosis Date Noted  . Chemoinduced Neutropenia/Leukopenia 05/15/2019  . Lt sided Pneumonia 05/15/2019  . Goals of care, counseling/discussion 04/24/2019  . Small cell carcinoma of lung, right ([place])   . Depression 04/07/2019  . Acute pulmonary embolism ([place]) 02/01/2019  . Lung abscess ([place]) 02/01/2019  . CHF (congestive heart  failure) ([place]) 03/15/2018  . Chronic diastolic CHF (congestive heart failure) ([place]) 10/28/2017  . H/O adenomatous polyp of colon 04/29/2017  . Port-A-Cath in place   . Malignant neoplasm of lung ([place])   . Aortic atherosclerosis ([place]) 04/23/2017  . Hepatic steatosis 04/23/2017  . Type 2 diabetes mellitus ([place])   . Essential hypertension   . Atrial fibrillation ([place]) 11/29/2016  . Diabetes mellitus type 2 in obese ([place]) 11/29/2016  . Healthcare-associated pneumonia 11/29/2016  . Small cell lung cancer ([place]) 04/03/2016  . COPD (chronic obstructive pulmonary disease) ([place]) 02/08/2012  . Atrial flutter ([place]) 01/06/2012  . Nonischemic cardiomyopathy ([place]) 10/15/2011  . Tobacco abuse 10/15/2011    Past Surgical History:  Procedure Laterality Date  . APPENDECTOMY    . ATRIAL ABLATION SURGERY  11/06/2011   CTI ablation for atrial flutter by Dr [name]  . ATRIAL FLUTTER ABLATION N/A 11/06/2011   Procedure: ATRIAL FLUTTER ABLATION;  Surgeon: Thompson Grayer, MD;  Location: [facility] CATH LAB;  Service: Cardiovascular;  Laterality: N/A;  . Cataracts    . COLONOSCOPY WITH PROPOFOL N/A 06/06/2017   Procedure: COLONOSCOPY WITH PROPOFOL;  Surgeon: [name], MD;  Location: [facility] ENDO SUITE;  Service: Endoscopy;  Laterality: N/A;  1:15pm  . IR GENERIC HISTORICAL  04/09/2016   IR US GUIDE VASC ACCESS RIGHT 04/09/2016 [name], DO [facility]-INTERV  RAD  . IR GENERIC HISTORICAL  04/09/2016   IR FLUORO GUIDE PORT INSERTION RIGHT 04/09/2016 [name], DO [facility]-INTERV RAD  . PORT-A-CATH REMOVAL N/A 04/24/2017   Procedure: MINOR REMOVAL PORT-A-CATH;  Surgeon: [name], MD;  Location: [facility] ORS;  Service: General;  Laterality: N/A;  . PORTACATH PLACEMENT Left 04/22/2019   Procedure: INSERTION PORT-A-CATH;  Surgeon: Aviva Signs, MD;  Location: [facility] ORS;  Service: General;  Laterality: Left;  . TONSILLECTOMY         Family History  Problem Relation Age of Onset  . Diabetes Sister   . Hypertension Mother   .  Lung cancer Mother   . Hypertension Father   . Heart attack Father   . Hypertension Sister   . Hypertension Sister   . Hypertension Sister   . Pulmonary fibrosis Sister   . Breast cancer Cousin   . Colon cancer Neg Hx     Social History   Tobacco Use  . Smoking status: Former Smoker    Packs/day: 0.50    Years: 47.00    Pack years: 23.50    Types: Cigarettes    Quit date: 05/30/2012    Years since quitting: 7.0  . Smokeless tobacco: Former Systems developer    Quit date: 04/10/2014  . Tobacco comment: encouraged to quit today 03/04/12  Vaping Use  . Vaping Use: Never used  Substance Use Topics  . Alcohol use: No    Comment: quit 3 1/2 years ago  . Drug use: Not Currently    Comment: marajuana occasionally    Home Medications Prior to Admission medications   Medication Sig Start Date End Date Taking? Authorizing Provider  acetaminophen (TYLENOL) 500 MG tablet Take 1,000 mg by mouth every 6 (six) hours as needed for moderate pain or headache.    [provider]  apixaban (ELIQUIS) 5 MG TABS tablet Take 5 mg by mouth 2 (two) times daily.  02/13/19   [provider]  AQUALANCE LANCETS 30G MISC USE TO check blood glucose twice daily 06/19/17   [provider]  Artificial Saliva (BIOTENE DRY MOUTH MOISTURIZING) SOLN Apply 1 Dose topically as needed (dry mouth).  02/10/19   [provider]  atorvastatin (LIPITOR) 20 MG tablet Take 1 tablet (20 mg total) by mouth daily. 01/03/17   [name], MD  benzonatate (TESSALON) 200 MG capsule Take 1 capsule (200 mg total) by mouth 3 (three) times daily as needed for cough. 07/01/19   Lockamy, Theresia Lo, NP-C  Carboxymethylcellul-Glycerin (LUBRICATING EYE DROPS OP) Place 1 drop into the left eye daily as needed (dry eyes).    [provider]  diltiazem (DILACOR XR) 240 MG 24 hr capsule Take 240 mg by mouth daily.    [provider]  dofetilide (TIKOSYN) 250 MCG capsule Take 1 capsule (250 mcg total) by  mouth 2 (two) times daily. 09/15/18   Satira Sark, MD  fluticasone (FLONASE) 50 MCG/ACT nasal spray Place 2 sprays into both nostrils daily. 03/06/17   [name], MD  Fluticasone-Umeclidin-Vilant (TRELEGY ELLIPTA) 100-62.5-25 MCG/INH AEPB Inhale 1 puff into the lungs daily.    [provider]  furosemide (LASIX) 40 MG tablet TAKE 1 TABLET BY MOUTH EVERY DAY 06/07/17   [name], MD  gabapentin (NEURONTIN) 300 MG capsule Take 600 mg by mouth 2 (two) times daily.     [provider]  guaifenesin (ROBITUSSIN) 100 MG/5ML syrup Take 200 mg by mouth at bedtime as needed for cough.  [provider]  HYDROcodone-acetaminophen (NORCO/VICODIN) 5-325 MG tablet Take 1 tablet by mouth at bedtime as needed. 05/21/19   Derek Jack, MD  hydrOXYzine (VISTARIL) 25 MG capsule Take 25 mg by mouth every 8 (eight) hours.    [provider]  insulin lispro (HUMALOG) 100 UNIT/ML injection Inject 2-20 Units into the skin 3 (three) times daily with meals. Sliding scale     [provider]  Insulin Syringe-Needle U-100 28G X 1/2" 1 ML MISC 1 each by Does not apply route 3 (three) times daily. 06/17/19   Derek Jack, MD  ipratropium-albuterol (DUONEB) 0.5-2.5 (3) MG/3ML SOLN Take 3 mLs by nebulization every 4 (four) hours as needed (shortness of breath).     [provider]  Lancet Devices (ADJUSTABLE LANCING DEVICE) MISC TO check blood glucose daily 01/06/19   [provider]  LANTUS SOLOSTAR 100 UNIT/ML Solostar Pen INJECT 20 UNITS UNDER THE SKIN EVERY MORNING AND INJECT 20 UNITS UNDER THE SKIN EVERY EVENING Patient taking differently: Inject 20 Units into the skin 2 (two) times daily.  05/02/17   [name], MD  levalbuterol Penne Lash) 0.63 MG/3ML nebulizer solution Inhale 3 mLs (0.63 mg total) into the lungs 4 (four) times daily. 01/03/17   [name], MD  Lidocaine (SALONPAS PAIN RELIEVING EX) Apply 1 patch topically daily.      [provider]  lidocaine-prilocaine (EMLA) cream Apply a small amount to port a cath site and cover with plastic wrap 1 hour prior to chemotherapy appointments 05/06/19   Derek Jack, MD  metFORMIN (GLUCOPHAGE) 500 MG tablet Take 500 mg by mouth in the morning and at bedtime.    [provider]  metoprolol succinate (TOPROL-XL) 100 MG 24 hr tablet Take 100 mg by mouth daily. 02/13/19   [provider]  Multiple Vitamin (MULTIVITAMIN WITH MINERALS) TABS tablet Take 1 tablet by mouth daily.    [provider]  potassium chloride (KLOR-CON) 10 MEQ tablet Take 10 mEq by mouth 2 (two) times daily. 02/13/19   [provider]  predniSONE (STERAPRED UNI-PAK 48 TAB) 5 MG (48) TBPK tablet TAKE DOSE PACK AS DIRECTED PER PACKAGE INSTRUCTIONS 06/08/19   [provider]  pregabalin (LYRICA) 150 MG capsule Take 150 mg by mouth in the morning and at bedtime.    [provider]  PROAIR HFA 108 (90 Base) MCG/ACT inhaler INHALE TWO PUFFS BY MOUTH EVERY 4 HOURS AS NEEDED FOR WHEEZING 06/11/17   [name], MD  prochlorperazine (COMPAZINE) 10 MG tablet Take 1 tablet (10 mg total) by mouth every 6 (six) hours as needed (Nausea or vomiting). 05/06/19   Derek Jack, MD  senna-docusate (SENOKOT-S) 8.6-50 MG tablet Take 2 tablets by mouth at bedtime. 05/18/19   Roxan Hockey, MD  spironolactone (ALDACTONE) 25 MG tablet Take 12.5 mg by mouth 2 (two) times daily.  03/11/19   [provider]  tamsulosin (FLOMAX) 0.4 MG CAPS capsule Take 1 capsule (0.4 mg total) by mouth 2 (two) times daily after a meal. 01/03/17   [name], MD  tiotropium (SPIRIVA HANDIHALER) 18 MCG inhalation capsule Place 1 capsule (18 mcg total) into inhaler and inhale daily. 03/06/17   [name], MD  traZODone (DESYREL) 50 MG tablet Take 50 mg by mouth at bedtime. 04/04/17   [provider]    Allergies    Patient has no known allergies.   Review of Systems   Review of Systems  Constitutional: Positive for fatigue. Negative for chills and fever.  HENT: Negative for rhinorrhea and sore throat.   Eyes: Negative for visual disturbance.  Respiratory: Positive for shortness of breath. Negative for cough.   Cardiovascular: Negative for chest pain and leg swelling.  Gastrointestinal: Negative for abdominal pain, diarrhea, nausea and vomiting.  Genitourinary: Negative for dysuria.  Musculoskeletal: Negative for back pain and neck pain.  Skin: Negative for rash.  Neurological: Negative for dizziness, light-headedness and headaches.  Hematological: Does not bruise/bleed easily.  Psychiatric/Behavioral: Negative for confusion.    Physical Exam Updated Vital Signs BP 125/71 (BP Location: Right Arm)   Pulse 97   Temp (!) 100.5 F (38.1 C) (Oral)   Resp 12   Ht 1.854 m (6\' 1" )   Wt 112 kg   SpO2 90%   BMI 32.58 kg/m   Physical Exam Vitals and nursing note reviewed.  Constitutional:      Appearance: Normal appearance. He is well-developed.  HENT:     Head: Normocephalic and atraumatic.  Eyes:     Extraocular Movements: Extraocular movements intact.     Conjunctiva/sclera: Conjunctivae normal.     Pupils: Pupils are equal, round, and reactive to light.  Cardiovascular:     Rate and Rhythm: Normal rate and regular rhythm.     Heart sounds: No murmur heard.   Pulmonary:     Effort: Pulmonary effort is normal. No respiratory distress.     Breath sounds: Normal breath sounds.  Abdominal:     Palpations: Abdomen is soft.     Tenderness: There is no abdominal tenderness.  Musculoskeletal:        General: No swelling. Normal range of motion.     Cervical back: Normal range of motion and neck supple.     Comments: No significant edema.  Skin:    General: Skin is warm and dry.     Capillary Refill: Capillary refill takes less than 2 seconds.  Neurological:     General: No focal deficit present.     Mental Status: He  is alert and oriented to person, place, and time.     ED Results / Procedures / Treatments   Labs (all labs ordered are listed, but only abnormal results are displayed) Labs Reviewed  BASIC METABOLIC PANEL - Abnormal; Notable for the following components:      Result Value   Chloride 95 (*)    Glucose, Bld 240 (*)    Creatinine, Ser 1.28 (*)    Calcium 8.8 (*)    GFR calc non Af Amer 58 (*)    All other components within normal limits  CBC  URINALYSIS, ROUTINE W REFLEX MICROSCOPIC    EKG None   ED ECG REPORT   Date: 07/02/2019  Rate: 95  Rhythm: normal sinus rhythm  QRS Axis: normal  Intervals: normal  ST/T Wave abnormalities: normal  Conduction Disutrbances:none  Narrative Interpretation:   Old EKG Reviewed: none available  I have personally reviewed the EKG tracing and agree with the computerized printout as noted.   Radiology DG Chest 2 View  Result Date: 07/01/2019 CLINICAL DATA:  Cough and chest pain. History of small cell lung carcinoma EXAM: CHEST - 2 VIEW COMPARISON:  Jun 12, 2019 chest radiograph; chest CT March 11, 2019 FINDINGS: Airspace opacity with consolidation in the right medial upper lobe region is stable with extension of opacity in the apex. There is apparent scarring in the left base. No new opacity is appreciable by radiography. The heart size and pulmonary vascularity are normal. No adenopathy. Port-A-Cath  tip is in the superior vena cava. No pneumothorax. No bone lesions. IMPRESSION: Persistent volume loss with consolidation right upper lobe medially extending in the right apex. Prior CT showed evidence of radiation therapy change in this area. A degree of persistent neoplasm in this area cannot be excluded by radiography. Apparent scarring left base, stable. No new opacity appreciable by radiography. Cardiac silhouette stable. No adenopathy evident by radiography. Port-A-Cath tip in superior vena cava. Electronically Signed   By: Lowella Grip  III M.D.   On: 07/01/2019 10:14    Procedures Procedures (including critical care time)  Medications Ordered in ED Medications - No data to display  ED Course  I have reviewed the triage vital signs and the nursing notes.  Pertinent labs & imaging results that were available during my care of the patient were reviewed by me and considered in my medical decision making (see chart for details).    MDM Rules/Calculators/A&P                          Work-up here repeat temp is normal.  Chest x-ray without any acute changes.  Urinalysis normal.  No leukocytosis.  Lactic acid is normal.  Blood cultures were done and are pending.  Patient normally on oxygen.  Normally on 2 L.  Needing 4 L here.  No wheezing no acute distress.  Patient comfortable.  No hypotension.  Originally had some mild tachycardia now resolved.  Do not think patient has a pulmonary embolus.  No significant leg swelling.  But patient is at some risk for that.  But clinically patient improved without any significant intervention.  We'll have patient follow back up with hematology oncology. Final Clinical Impression(s) / ED Diagnoses Final diagnoses:  None    Rx / DC Orders ED Discharge Orders    None       Fredia Sorrow, MD 07/03/19 [phone]

## 2019-07-03 ENCOUNTER — Ambulatory Visit (HOSPITAL_COMMUNITY): Admission: RE | Admit: 2019-07-03 | Payer: Medicare Other | Source: Ambulatory Visit

## 2019-07-03 NOTE — Discharge Instructions (Addendum)
Work-up here tonight without any acute findings or anything indicating admission.  Continue your follow-up with hematology oncology.  Low-grade fever here resolved.  No indications of the cause of the fever.  Chest x-ray negative for pneumonia.  Urine normal.  Blood cultures were done you'll be contacted if they're abnormal.  Return for any new or worse symptoms.  Continue to use your oxygen at home.  Return for any new or worse symptoms.

## 2019-07-07 ENCOUNTER — Ambulatory Visit (HOSPITAL_COMMUNITY)
Admission: RE | Admit: 2019-07-07 | Discharge: 2019-07-07 | Disposition: A | Discharge status: Home / Self Care | Payer: Medicare Other | Source: Ambulatory Visit | Attending: Hematology | Admitting: Hematology

## 2019-07-07 ENCOUNTER — Other Ambulatory Visit: Payer: Self-pay

## 2019-07-07 DIAGNOSIS — C3491 Malignant neoplasm of unspecified part of right bronchus or lung: Secondary | ICD-10-CM | POA: Diagnosis present

## 2019-07-07 LAB — CULTURE, BLOOD (ROUTINE X 2)
Culture: NO GROWTH
Culture: NO GROWTH
Special Requests: ADEQUATE
Special Requests: ADEQUATE

## 2019-07-07 MED ORDER — IOHEXOL 9 MG/ML PO SOLN
ORAL | Status: AC
  Filled 2019-07-07: qty 1000

## 2019-07-07 MED ORDER — IOHEXOL 300 MG/ML  SOLN
100.0000 mL | Freq: Once | INTRAMUSCULAR | Status: AC | PRN
  Administered 2019-07-07: 100 mL via INTRAVENOUS

## 2019-07-08 ENCOUNTER — Inpatient Hospital Stay (HOSPITAL_BASED_OUTPATIENT_CLINIC_OR_DEPARTMENT_OTHER): Payer: Medicare Other | Admitting: Hematology

## 2019-07-08 ENCOUNTER — Inpatient Hospital Stay (HOSPITAL_COMMUNITY): Payer: Medicare Other

## 2019-07-08 VITALS — BP 130/64 | HR 91 | Temp 97.7°F | Resp 18 | Wt 241.7 lb

## 2019-07-08 DIAGNOSIS — Z5112 Encounter for antineoplastic immunotherapy: Secondary | ICD-10-CM | POA: Diagnosis not present

## 2019-07-08 DIAGNOSIS — C3491 Malignant neoplasm of unspecified part of right bronchus or lung: Secondary | ICD-10-CM | POA: Diagnosis not present

## 2019-07-08 LAB — COMPREHENSIVE METABOLIC PANEL
ALT: 29 U/L (ref 0–44)
AST: 19 U/L (ref 15–41)
Albumin: 3.2 g/dL — ABNORMAL LOW (ref 3.5–5.0)
Alkaline Phosphatase: 67 U/L (ref 38–126)
Anion gap: 13 (ref 5–15)
BUN: 20 mg/dL (ref 8–23)
CO2: 30 mmol/L (ref 22–32)
Calcium: 9.4 mg/dL (ref 8.9–10.3)
Chloride: 94 mmol/L — ABNORMAL LOW (ref 98–111)
Creatinine, Ser: 1.11 mg/dL (ref 0.61–1.24)
GFR calc Af Amer: >60 mL/min (ref >60)
GFR calc non Af Amer: >60 mL/min (ref >60)
Glucose, Bld: 321 mg/dL — ABNORMAL HIGH (ref 70–99)
Potassium: 4.1 mmol/L (ref 3.5–5.1)
Sodium: 137 mmol/L (ref 135–145)
Total Bilirubin: 0.4 mg/dL (ref 0.3–1.2)
Total Protein: 6.7 g/dL (ref 6.5–8.1)

## 2019-07-08 LAB — CBC WITH DIFFERENTIAL/PLATELET
Abs Immature Granulocytes: 0.12 10*3/uL — ABNORMAL HIGH (ref 0.00–0.07)
Basophils Absolute: 0.1 10*3/uL (ref 0.0–0.1)
Basophils Relative: 1 %
Eosinophils Absolute: 0 10*3/uL (ref 0.0–0.5)
Eosinophils Relative: 0 %
HCT: 31.8 % — ABNORMAL LOW (ref 39.0–52.0)
Hemoglobin: 9.5 g/dL — ABNORMAL LOW (ref 13.0–17.0)
Immature Granulocytes: 2 %
Lymphocytes Relative: 5 %
Lymphs Abs: 0.4 10*3/uL — ABNORMAL LOW (ref 0.7–4.0)
MCH: 31.6 pg (ref 26.0–34.0)
MCHC: 29.9 g/dL — ABNORMAL LOW (ref 30.0–36.0)
MCV: 105.6 fL — ABNORMAL HIGH (ref 80.0–100.0)
Monocytes Absolute: 1 10*3/uL (ref 0.1–1.0)
Monocytes Relative: 12 %
Neutro Abs: 6.5 10*3/uL (ref 1.7–7.7)
Neutrophils Relative %: 80 %
Platelets: 235 10*3/uL (ref 150–400)
RBC: 3.01 MIL/uL — ABNORMAL LOW (ref 4.22–5.81)
RDW: 17.8 % — ABNORMAL HIGH (ref 11.5–15.5)
WBC: 8.1 10*3/uL (ref 4.0–10.5)
nRBC: 0 % (ref 0.0–0.2)

## 2019-07-08 MED ORDER — SODIUM CHLORIDE 0.9% FLUSH
10.0000 mL | INTRAVENOUS | Status: DC | PRN
  Administered 2019-07-08: 10 mL via INTRAVENOUS

## 2019-07-08 MED ORDER — LEVOFLOXACIN 500 MG PO TABS
500.0000 mg | ORAL_TABLET | Freq: Every day | ORAL | 0 refills | Status: DC
Start: 2019-07-08 — End: 2019-07-15

## 2019-07-08 NOTE — Progress Notes (Signed)
[place], [place] [address]   CLINIC:  Medical Oncology/Hematology  PCP:  [facility] [place] / [place] [address] [phone]   REASON FOR VISIT:  Follow-up for small cell lung cancer  CURRENT THERAPY: Carboplatin, VP-16, Tecentriq  BRIEF ONCOLOGIC HISTORY:  Oncology History  Small cell lung cancer ([place])  04/03/2016 Initial Diagnosis   Small cell lung cancer ([place])   04/09/2016 Imaging   MRI brain- Negative for metastatic disease to the brain. No acute abnormality.   04/09/2016 Procedure   Right IJ port catheter placement by IR   04/13/2016 PET scan   . The right apical lung mass has significantly enlarged and there is new right paratracheal adenopathy as well as a hypermetabolic new lymph node in the right inner infraclavicular fossa potentially with early impingement on adjacent neurovascular structures. 2. Other imaging findings of potential clinical significance: Chronic bilateral maxillary sinusitis. Left carotid atherosclerotic calcification. Aortoiliac atherosclerotic vascular disease. Coronary atherosclerosis. Paraseptal emphysema. Mild atelectasis in the lung bases. Scattered sigmoid colon diverticula. Probable right pars defect at L5.   05/09/2016 - 07/12/2016 Radiation Therapy   Radiotherapy to right Lung+MS 63 Gy/35 fractions at 1.8 GY/f using 6 x photons 3- D CRT technique Dates: 4/18-6/21/18       06/19/2016 Treatment Plan Change   Cisplatin dose reduced by 20% for cycle #4 due to severe thrombocytopenia following cycle #3 (13,000).   08/21/2016 PET scan   1. Marked reduction in size and metabolic activity of RIGHT upper lobe mass. 2. Resolution of mediastinal and RIGHT supraclavicular lymphadenopathy. 3. No evidence of disease progression.   05/06/2019 -  Chemotherapy   The patient had palonosetron (ALOXI) injection 0.25 mg, 0.25 mg, Intravenous,  Once, 3 of 4  cycles Administration: 0.25 mg (05/06/2019), 0.25 mg (05/27/2019), 0.25 mg (06/17/2019) pegfilgrastim-cbqv (UDENYCA) injection 6 mg, 6 mg, Subcutaneous, Once, 3 of 4 cycles Administration: 6 mg (05/11/2019), 6 mg (06/01/2019), 6 mg (06/23/2019) CARBOplatin (PARAPLATIN) 450 mg in sodium chloride 0.9 % 250 mL chemo infusion, 450 mg (100 % of original dose 446.4 mg), Intravenous,  Once, 3 of 4 cycles Dose modification:   (original dose 446.4 mg, Cycle 1, Reason: Provider Judgment),   (original dose 500 mg, Cycle 2, Reason: Provider Judgment), 500 mg (original dose 500 mg, Cycle 2, Reason: Provider Judgment), 500 mg (original dose 500 mg, Cycle 3) Administration: 450 mg (05/06/2019), 500 mg (05/27/2019), 500 mg (06/17/2019) etoposide (VEPESID) 190 mg in sodium chloride 0.9 % 500 mL chemo infusion, 80 mg/m2 = 190 mg (80 % of original dose 100 mg/m2), Intravenous,  Once, 3 of 4 cycles Dose modification: 80 mg/m2 (80 % of original dose 100 mg/m2, Cycle 1, Reason: Provider Judgment), 80 mg/m2 (original dose 100 mg/m2, Cycle 1, Reason: Provider Judgment), 80 mg/m2 (80 % of original dose 100 mg/m2, Cycle 2, Reason: Provider Judgment) Administration: 190 mg (05/06/2019), 190 mg (05/07/2019), 190 mg (05/08/2019), 190 mg (05/27/2019), 190 mg (05/28/2019), 190 mg (05/29/2019), 190 mg (06/17/2019), 190 mg (06/18/2019), 190 mg (06/19/2019) fosaprepitant (EMEND) 150 mg in sodium chloride 0.9 % 145 mL IVPB, 150 mg, Intravenous,  Once, 3 of 4 cycles Administration: 150 mg (05/06/2019), 150 mg (05/27/2019), 150 mg (06/17/2019) atezolizumab (TECENTRIQ) 1,200 mg in sodium chloride 0.9 % 250 mL chemo infusion, 1,200 mg, Intravenous, Once, 3 of 8 cycles Administration: 1,200 mg (05/06/2019), 1,200 mg (05/27/2019), 1,200 mg (06/17/2019)  for chemotherapy treatment.      CANCER STAGING: Cancer Staging  Small cell lung cancer (HCC) Staging form: Lung, AJCC 8th Edition - Clinical: Stage IIIC (cT3, cN3, cM0) - Signed by Twana First, MD on  04/16/2016   INTERVAL HISTORY:  Chase Herrera, a 69 y.o. male, returns for routine follow-up and consideration for next cycle of chemotherapy. Chase Herrera was last seen on 06/17/2019.  Due for cycle #4 of Tecentriq, carboplatin, VP-16 today.   Overall, Chase Herrera tells me Chase Herrera has been feeling pretty well. Chase Herrera tolerated the last treatment well, and Chase Herrera denies having nausea or sores. Chase Herrera is still on the nasal cannula and his SOB is stable. His wet cough has recently been bringing up bright yellow sputum. Chase Herrera went to [facility] ED for his COPD exacerbation on 07/06/2019 and was given Solu-Medrol which helped. Chase Herrera denies having any F/C recently.  Overall, Chase Herrera feels ready for next cycle of chemo today.    REVIEW OF SYSTEMS:  Review of Systems  Constitutional: Positive for fatigue (mild). Negative for appetite change, chills and fever.  HENT:   Positive for trouble swallowing.   Respiratory: Positive for cough (COPD) and shortness of breath.   Cardiovascular: Positive for chest pain and palpitations.  Neurological: Positive for numbness (hands & feet).  All other systems reviewed and are negative.   PAST MEDICAL/SURGICAL HISTORY:  Past Medical History:  Diagnosis Date  . Alcohol abuse    Quit 08/23/11  . Arthritis   . Atrial fibrillation ([place])   . Atrial flutter ([place])    CTI ablation by Dr [name] 10/2011  . Bacteremia due to Gram-positive bacteria 11/29/2016  . CHF (congestive heart failure) ([place])   . COPD (chronic obstructive pulmonary disease) ([place])   . Depression   . Essential hypertension   . History of cardiomyopathy    LVEF 25-30% 08/2011 with subsequent normalization  . History of kidney stones   . Obesity   . Prostate enlargement   . Small cell lung cancer ([place])    Right upper lobe - follows with [facility]  . Type 2 diabetes mellitus ([place])    Past Surgical History:  Procedure Laterality Date  . APPENDECTOMY    . ATRIAL ABLATION SURGERY  11/06/2011   CTI ablation for atrial flutter by  Dr [name]  . ATRIAL FLUTTER ABLATION N/A 11/06/2011   Procedure: ATRIAL FLUTTER ABLATION;  Surgeon: Thompson Grayer, MD;  Location: [facility] CATH LAB;  Service: Cardiovascular;  Laterality: N/A;  . Cataracts    . COLONOSCOPY WITH PROPOFOL N/A 06/06/2017   Procedure: COLONOSCOPY WITH PROPOFOL;  Surgeon: [name], MD;  Location: [facility] ENDO SUITE;  Service: Endoscopy;  Laterality: N/A;  1:15pm  . IR GENERIC HISTORICAL  04/09/2016   IR US GUIDE VASC ACCESS RIGHT 04/09/2016 [name], DO [facility]-INTERV RAD  . IR GENERIC HISTORICAL  04/09/2016   IR FLUORO GUIDE PORT INSERTION RIGHT 04/09/2016 [name], DO [facility]-INTERV RAD  . PORT-A-CATH REMOVAL N/A 04/24/2017   Procedure: MINOR REMOVAL PORT-A-CATH;  Surgeon: [name], MD;  Location: [facility] ORS;  Service: General;  Laterality: N/A;  . PORTACATH PLACEMENT Left 04/22/2019   Procedure: INSERTION PORT-A-CATH;  Surgeon: Aviva Signs, MD;  Location: [facility] ORS;  Service: General;  Laterality: Left;  . TONSILLECTOMY      SOCIAL HISTORY:  Social History   Socioeconomic History  . Marital status: Single    Spouse name: Not on file  . Number of children: Not on file  . Years of education: Not on file  . Highest education level: Not on file  Occupational History  . Occupation: retired     Comment: Building services engineer  Tobacco Use  . Smoking status: Former Smoker    Packs/day: 0.50    Years: 47.00    Pack years: 23.50    Types: Cigarettes    Quit date: 05/30/2012    Years since quitting: 7.1  . Smokeless tobacco: Former Systems developer    Quit date: 04/10/2014  . Tobacco comment: encouraged to quit today 03/04/12  Vaping Use  . Vaping Use: Never used  Substance and Sexual Activity  . Alcohol use: No    Comment: quit 3 1/2 years ago  . Drug use: Not Currently    Comment: marajuana occasionally  . Sexual activity: Not Currently  Other Topics Concern  . Not on file  Social History Narrative   Lives in [place] alone.   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 Difficulty of Paying Living Expenses:   Food Insecurity:   . Worried About Charity fundraiser in the Last Year:   . Arboriculturist in the Last Year:   Transportation Needs:   . Film/video editor (Medical):   Marland Kitchen Lack of Transportation (Non-Medical):   Physical Activity:   . Days of Exercise per Week:   . Minutes of Exercise per Session:   Stress:   . Feeling of Stress :   Social Connections:   . Frequency of Communication with Friends and Family:   . Frequency of Social Gatherings with Friends and Family:   . Attends Religious Services:   . Active Member of Clubs or Organizations:   . Attends Archivist Meetings:   Marland Kitchen Marital Status:   Intimate Partner Violence:   . Fear of Current or Ex-Partner:   . Emotionally Abused:   Marland Kitchen Physically Abused:   . Sexually Abused:     FAMILY HISTORY:  Family History  Problem Relation Age of Onset  . Diabetes Sister   . Hypertension Mother   . Lung cancer Mother   . Hypertension Father   . Heart attack Father   . Hypertension Sister   . Hypertension Sister   . Hypertension Sister   . Pulmonary fibrosis Sister   . Breast cancer Cousin   . Colon cancer Neg Hx     CURRENT MEDICATIONS:  Current Outpatient Medications  Medication Sig Dispense Refill  . acetaminophen (TYLENOL) 500 MG tablet Take 1,000 mg by mouth every 6 (six) hours as needed for moderate pain or headache.    Marland Kitchen apixaban (ELIQUIS) 5 MG TABS tablet Take 5 mg by mouth 2 (two) times daily.     Marland Kitchen AQUALANCE LANCETS 30G MISC USE TO check blood glucose twice daily  99  . Artificial Saliva (BIOTENE DRY MOUTH MOISTURIZING) SOLN Apply 1 Dose topically as needed (dry mouth).     Marland Kitchen atorvastatin (LIPITOR) 20 MG tablet Take 1 tablet (20 mg total) by mouth daily. 90 tablet 3  . benzonatate (TESSALON) 200 MG capsule Take 1 capsule (200 mg total) by mouth 3 (three) times daily as needed  for cough. 30 capsule 0  . Carboxymethylcellul-Glycerin (LUBRICATING EYE DROPS OP) Place 1 drop into the left eye daily as needed (dry eyes).    Marland Kitchen diltiazem (DILACOR XR) 240 MG 24 hr capsule Take 240 mg by mouth daily.    Marland Kitchen dofetilide (TIKOSYN) 250 MCG capsule Take 1 capsule (  250 mcg total) by mouth 2 (two) times daily.    . fluticasone (FLONASE) 50 MCG/ACT nasal spray Place 2 sprays into both nostrils daily. 16 g 6  . Fluticasone-Umeclidin-Vilant (TRELEGY ELLIPTA) 100-62.5-25 MCG/INH AEPB Inhale 1 puff into the lungs daily.    . furosemide (LASIX) 40 MG tablet TAKE 1 TABLET BY MOUTH EVERY DAY 90 tablet 0  . gabapentin (NEURONTIN) 300 MG capsule Take 600 mg by mouth 2 (two) times daily.     Marland Kitchen guaifenesin (ROBITUSSIN) 100 MG/5ML syrup Take 200 mg by mouth at bedtime as needed for cough.    Marland Kitchen HYDROcodone-acetaminophen (NORCO/VICODIN) 5-325 MG tablet Take 1 tablet by mouth at bedtime as needed. 30 tablet 0  . hydrOXYzine (VISTARIL) 25 MG capsule Take 25 mg by mouth every 8 (eight) hours.    . insulin lispro (HUMALOG) 100 UNIT/ML injection Inject 2-20 Units into the skin 3 (three) times daily with meals. Sliding scale     . Insulin Syringe-Needle U-100 28G X 1/2" 1 ML MISC 1 each by Does not apply route 3 (three) times daily. 100 each 3  . ipratropium-albuterol (DUONEB) 0.5-2.5 (3) MG/3ML SOLN Take 3 mLs by nebulization every 4 (four) hours as needed (shortness of breath).     Elmore Guise Devices (ADJUSTABLE LANCING DEVICE) MISC TO check blood glucose daily    . LANTUS SOLOSTAR 100 UNIT/ML Solostar Pen INJECT 20 UNITS UNDER THE SKIN EVERY MORNING AND INJECT 20 UNITS UNDER THE SKIN EVERY EVENING (Patient taking differently: Inject 20 Units into the skin 2 (two) times daily. ) 45 mL 0  . levalbuterol (XOPENEX) 0.63 MG/3ML nebulizer solution Inhale 3 mLs (0.63 mg total) into the lungs 4 (four) times daily. 3 mL 0  . Lidocaine (SALONPAS PAIN RELIEVING EX) Apply 1 patch topically daily.     Marland Kitchen  lidocaine-prilocaine (EMLA) cream Apply a small amount to port a cath site and cover with plastic wrap 1 hour prior to chemotherapy appointments 30 g 3  . metFORMIN (GLUCOPHAGE) 500 MG tablet Take 500 mg by mouth in the morning and at bedtime.    . metoprolol succinate (TOPROL-XL) 100 MG 24 hr tablet Take 100 mg by mouth daily.    . Multiple Vitamin (MULTIVITAMIN WITH MINERALS) TABS tablet Take 1 tablet by mouth daily.    . potassium chloride (KLOR-CON) 10 MEQ tablet Take 10 mEq by mouth 2 (two) times daily.    . predniSONE (STERAPRED UNI-PAK 48 TAB) 5 MG (48) TBPK tablet TAKE DOSE PACK AS DIRECTED PER PACKAGE INSTRUCTIONS    . pregabalin (LYRICA) 150 MG capsule Take 150 mg by mouth in the morning and at bedtime.    Marland Kitchen PROAIR HFA 108 (90 Base) MCG/ACT inhaler INHALE TWO PUFFS BY MOUTH EVERY 4 HOURS AS NEEDED FOR WHEEZING 17 g 0  . prochlorperazine (COMPAZINE) 10 MG tablet Take 1 tablet (10 mg total) by mouth every 6 (six) hours as needed (Nausea or vomiting). 60 tablet 1  . senna-docusate (SENOKOT-S) 8.6-50 MG tablet Take 2 tablets by mouth at bedtime. 60 tablet 0  . spironolactone (ALDACTONE) 25 MG tablet Take 12.5 mg by mouth 2 (two) times daily.     . tamsulosin (FLOMAX) 0.4 MG CAPS capsule Take 1 capsule (0.4 mg total) by mouth 2 (two) times daily after a meal. 180 capsule 1  . tiotropium (SPIRIVA HANDIHALER) 18 MCG inhalation capsule Place 1 capsule (18 mcg total) into inhaler and inhale daily. 30 capsule 12  . traZODone (DESYREL) 50 MG tablet Take 50  mg by mouth at bedtime.  5   No current facility-administered medications for this visit.   Facility-Administered Medications Ordered in Other Visits  Medication Dose Route Frequency Provider Last Rate Last Admin  . sodium chloride flush (NS) 0.9 % injection 10 mL  10 mL Intravenous PRN Derek Jack, MD   10 mL at 07/08/19 0821    ALLERGIES:  No Known Allergies  PHYSICAL EXAM:  Performance status (ECOG): 1 - Symptomatic but  completely ambulatory  Vitals:   07/08/19 0829  BP: 130/64  Pulse: 91  Resp: 18  Temp: 97.7 F (36.5 C)  SpO2: 94%   Wt Readings from Last 3 Encounters:  07/08/19 241 lb 11.2 oz (109.6 kg)  07/02/19 246 lb 14.6 oz (112 kg)  07/01/19 247 lb (112 kg)   Physical Exam Vitals reviewed.  Constitutional:      Appearance: Normal appearance. Chase Herrera is obese.  Cardiovascular:     Rate and Rhythm: Normal rate and regular rhythm.     Pulses: Normal pulses.     Heart sounds: Normal heart sounds.  Pulmonary:     Effort: Pulmonary effort is normal.     Breath sounds: Normal breath sounds.  Skin:    Comments: Scabs on bilat anterior knees  Neurological:     General: No focal deficit present.     Mental Status: Chase Herrera is alert and oriented to person, place, and time.  Psychiatric:        Mood and Affect: Mood normal.        Behavior: Behavior normal.     LABORATORY DATA:  I have reviewed the labs as listed.  CBC Latest Ref Rng & Units 07/08/2019 07/02/2019 07/01/2019  WBC 4.0 - 10.5 K/uL 8.1 8.3 10.8(H)  Hemoglobin 13.0 - 17.0 g/dL 9.5(L) 9.7(L) 9.5(L)  Hematocrit 39 - 52 % 31.8(L) 31.6(L) 31.4(L)  Platelets 150 - 400 K/uL 235 92(L) 63(L)   CMP Latest Ref Rng & Units 07/08/2019 07/02/2019 07/01/2019  Glucose 70 - 99 mg/dL 321(H) 240(H) 299(H)  BUN 8 - 23 mg/dL 20 13 14   Creatinine 0.61 - 1.24 mg/dL 1.11 1.28(H) 0.99  Sodium 135 - 145 mmol/L 137 135 134(L)  Potassium 3.5 - 5.1 mmol/L 4.1 3.9 4.1  Chloride 98 - 111 mmol/L 94(L) 95(L) 94(L)  CO2 22 - 32 mmol/L 30 29 30   Calcium 8.9 - 10.3 mg/dL 9.4 8.8(L) 8.7(L)  Total Protein 6.5 - 8.1 g/dL 6.7 6.4(L) 6.1(L)  Total Bilirubin 0.3 - 1.2 mg/dL 0.4 0.5 0.5  Alkaline Phos 38 - 126 U/L 67 86 83  AST 15 - 41 U/L 19 19 19   ALT 0 - 44 U/L 29 30 32    DIAGNOSTIC IMAGING:  I have independently reviewed the scans and discussed with the patient. DG Chest 2 View  Result Date: 07/01/2019 CLINICAL DATA:  Cough and chest pain. History of small cell lung  carcinoma EXAM: CHEST - 2 VIEW COMPARISON:  Jun 12, 2019 chest radiograph; chest CT March 11, 2019 FINDINGS: Airspace opacity with consolidation in the right medial upper lobe region is stable with extension of opacity in the apex. There is apparent scarring in the left base. No new opacity is appreciable by radiography. The heart size and pulmonary vascularity are normal. No adenopathy. Port-A-Cath tip is in the superior vena cava. No pneumothorax. No bone lesions. IMPRESSION: Persistent volume loss with consolidation right upper lobe medially extending in the right apex. Prior CT showed evidence of radiation therapy change in this area. A  degree of persistent neoplasm in this area cannot be excluded by radiography. Apparent scarring left base, stable. No new opacity appreciable by radiography. Cardiac silhouette stable. No adenopathy evident by radiography. Port-A-Cath tip in superior vena cava. Electronically Signed   By: Lowella Grip III M.D.   On: 07/01/2019 10:14   CT Chest W Contrast  Result Date: 07/07/2019 CLINICAL DATA:  Recurrence small cell lung cancer, initially diagnosed on 04/03/2016 history of radiotherapy and systemic therapy. Currently on systemic therapy. EXAM: CT CHEST, ABDOMEN, AND PELVIS WITH CONTRAST TECHNIQUE: Multidetector CT imaging of the chest, abdomen and pelvis was performed following the standard protocol during bolus administration of intravenous contrast. CONTRAST:  136mL OMNIPAQUE IOHEXOL 300 MG/ML  SOLN COMPARISON:  None. April 17, 2019 FINDINGS: CT CHEST FINDINGS Cardiovascular: Calcified and noncalcified atheromatous plaque in the thoracic aorta without aneurysm. Three-vessel coronary artery calcification. Normal heart size. No pericardial effusion. Central pulmonary vasculature on venous phase assessment unremarkable. Mediastinum/Nodes: LEFT-sided Port-A-Cath terminates in the mid superior vena cava. No thoracic inlet adenopathy. No axillary adenopathy. Lungs/Pleura:  Further consolidative change in the RIGHT upper lobe/volume loss along the mediastinal border a emanating from the RIGHT hilum. Nodular area in the RIGHT upper lobe extending towards areas of post treatment change has diminished since the previous exam currently measuring 2.0 x 1.4 cm (image 26, series 3) previously 2.8 x 1.8 cm. Nodule along the pleural surface in the peripheral RIGHT chest on image 38 of series 3 has nearly completely resolved measuring approximately 1.2 x 0.4 cm, previously 2.3 x 1.3 cm. Similar decrease in size of lesion on a along the pleural surface in the RIGHT lower lobe on image 72 of series 3 now measuring approximately 1.2 cm in long axis, previously nearly 2 cm. Patchy opacities at the RIGHT lung base have also diminished. Interval development however of an area of consolidative change in the LEFT upper lobe (image 15, series 3) 4.3 x 1.8 cm with some surrounding ground-glass. The lingular volume loss is a stable finding. Nodular area along the pleural surface at the LEFT lung base measuring 1.9 by 1.4 cm (image 91, series 3) more confluent than on the prior exam. Bronchial wall thickening to the LEFT lower lobe more pronounced than on the previous study. Background pulmonary emphysema as before. Musculoskeletal: No chest wall mass. CT ABDOMEN PELVIS FINDINGS Hepatobiliary: Liver with mildly lobulated contours, no focal hepatic lesion. No pericholecystic stranding. Pancreas: Pancreas is unremarkable. No peripancreatic stranding or ductal dilation. Spleen: Normal spleen. Adrenals/Urinary Tract: Adrenal glands are normal. Symmetric renal enhancement. Normal appearance of urinary bladder. No focal, suspicious renal lesion. No hydronephrosis. Stomach/Bowel: Gastrointestinal tract with signs of colonic diverticulosis. No evidence of diverticulitis. Vascular/Lymphatic: Calcified atheromatous plaque of the abdominal aorta. No aneurysm. No adenopathy. Reproductive: Prostate unremarkable by CT.  Other: No ascites.  No abdominal wall hernia. Musculoskeletal: Signs of healed LEFT-sided rib fractures. Chronic findings. Spinal degenerative changes. No acute musculoskeletal process. IMPRESSION: 1. Decreasing size nodules in the RIGHT upper lobe and lower lobe along the pleura. 2. Interval development of consolidative changes with surrounding ground-glass in the LEFT upper lobe and further added density in the process in the LEFT lower lobe. Findings may be sequela of infectious or inflammatory change, with scarring in the lower lobe and with more acute process in the LEFT upper lobe. Suggest attention on short interval follow-up and correlation with respiratory symptoms. 3. Bronchial wall thickening in the lower lobe in particular on the LEFT with material in the bronchi. Correlate  with any risk factors for aspiration. Findings could be related to concomitant bronchial inflammation given the nodular pattern seen at the LEFT lung base. (This pattern is similar to the prior exam) 4. Signs of colonic diverticulosis without evidence of diverticulitis. 5. Aortic atherosclerosis. These results will be called to the ordering clinician or representative by the Radiologist Assistant, and communication documented in the PACS or [employer]. Aortic Atherosclerosis (ICD10-I70.0) and Emphysema (ICD10-J43.9). Electronically Signed   By: Zetta Bills M.D.   On: 07/07/2019 13:07   CT Abdomen Pelvis W Contrast  Result Date: 07/07/2019 CLINICAL DATA:  Recurrence small cell lung cancer, initially diagnosed on 04/03/2016 history of radiotherapy and systemic therapy. Currently on systemic therapy. EXAM: CT CHEST, ABDOMEN, AND PELVIS WITH CONTRAST TECHNIQUE: Multidetector CT imaging of the chest, abdomen and pelvis was performed following the standard protocol during bolus administration of intravenous contrast. CONTRAST:  176mL OMNIPAQUE IOHEXOL 300 MG/ML  SOLN COMPARISON:  None. April 17, 2019 FINDINGS: CT CHEST FINDINGS  Cardiovascular: Calcified and noncalcified atheromatous plaque in the thoracic aorta without aneurysm. Three-vessel coronary artery calcification. Normal heart size. No pericardial effusion. Central pulmonary vasculature on venous phase assessment unremarkable. Mediastinum/Nodes: LEFT-sided Port-A-Cath terminates in the mid superior vena cava. No thoracic inlet adenopathy. No axillary adenopathy. Lungs/Pleura: Further consolidative change in the RIGHT upper lobe/volume loss along the mediastinal border a emanating from the RIGHT hilum. Nodular area in the RIGHT upper lobe extending towards areas of post treatment change has diminished since the previous exam currently measuring 2.0 x 1.4 cm (image 26, series 3) previously 2.8 x 1.8 cm. Nodule along the pleural surface in the peripheral RIGHT chest on image 38 of series 3 has nearly completely resolved measuring approximately 1.2 x 0.4 cm, previously 2.3 x 1.3 cm. Similar decrease in size of lesion on a along the pleural surface in the RIGHT lower lobe on image 72 of series 3 now measuring approximately 1.2 cm in long axis, previously nearly 2 cm. Patchy opacities at the RIGHT lung base have also diminished. Interval development however of an area of consolidative change in the LEFT upper lobe (image 15, series 3) 4.3 x 1.8 cm with some surrounding ground-glass. The lingular volume loss is a stable finding. Nodular area along the pleural surface at the LEFT lung base measuring 1.9 by 1.4 cm (image 91, series 3) more confluent than on the prior exam. Bronchial wall thickening to the LEFT lower lobe more pronounced than on the previous study. Background pulmonary emphysema as before. Musculoskeletal: No chest wall mass. CT ABDOMEN PELVIS FINDINGS Hepatobiliary: Liver with mildly lobulated contours, no focal hepatic lesion. No pericholecystic stranding. Pancreas: Pancreas is unremarkable. No peripancreatic stranding or ductal dilation. Spleen: Normal spleen.  Adrenals/Urinary Tract: Adrenal glands are normal. Symmetric renal enhancement. Normal appearance of urinary bladder. No focal, suspicious renal lesion. No hydronephrosis. Stomach/Bowel: Gastrointestinal tract with signs of colonic diverticulosis. No evidence of diverticulitis. Vascular/Lymphatic: Calcified atheromatous plaque of the abdominal aorta. No aneurysm. No adenopathy. Reproductive: Prostate unremarkable by CT. Other: No ascites.  No abdominal wall hernia. Musculoskeletal: Signs of healed LEFT-sided rib fractures. Chronic findings. Spinal degenerative changes. No acute musculoskeletal process. IMPRESSION: 1. Decreasing size nodules in the RIGHT upper lobe and lower lobe along the pleura. 2. Interval development of consolidative changes with surrounding ground-glass in the LEFT upper lobe and further added density in the process in the LEFT lower lobe. Findings may be sequela of infectious or inflammatory change, with scarring in the lower lobe and with more  acute process in the LEFT upper lobe. Suggest attention on short interval follow-up and correlation with respiratory symptoms. 3. Bronchial wall thickening in the lower lobe in particular on the LEFT with material in the bronchi. Correlate with any risk factors for aspiration. Findings could be related to concomitant bronchial inflammation given the nodular pattern seen at the LEFT lung base. (This pattern is similar to the prior exam) 4. Signs of colonic diverticulosis without evidence of diverticulitis. 5. Aortic atherosclerosis. These results will be called to the ordering clinician or representative by the Radiologist Assistant, and communication documented in the PACS or [employer]. Aortic Atherosclerosis (ICD10-I70.0) and Emphysema (ICD10-J43.9). Electronically Signed   By: Zetta Bills M.D.   On: 07/07/2019 13:07   DG Chest Port 1 View  Result Date: 07/02/2019 CLINICAL DATA:  Productive cough, lung cancer, tobacco abuse EXAM: PORTABLE  CHEST 1 VIEW COMPARISON:  07/01/2019 FINDINGS: 2 port frontal views of the chest. Cardiac silhouette demonstrates stable left chest wall is unremarkable. Chronic scarring and bronchiectasis again seen within the lungs, greatest at the bases, stable. There is chronic scarring in the lingula with blunting of the left costophrenic angle. The right-sided subpleural metastatic nodules seen on prior CT are difficult to appreciate on chest x-ray. Increased density in the right paratracheal region is unchanged since prior exams, consistent with post therapeutic change. No large effusion or pneumothorax. IMPRESSION: 1. Post therapeutic changes in the right paratracheal region. The metastatic right lung nodule seen on prior chest CT are not visible by radiograph. 2. Stable scarring and bronchiectasis primarily at the lung bases. 3. No acute process. Electronically Signed   By: Randa Ngo M.D.   On: 07/02/2019 18:38     ASSESSMENT:  1.  Recurrent small cell lung cancer: -Treated for limited stage small cell lung cancer with chemoradiation with cisplatin and etoposide therapy on 06/21/2016 followed by PCI. -CT scan on 04/17/2019 showed worsening right lung nodules.  Left lower lobe infectious cavitary lesion was decreased in size.  New right middle lobe lung nodule present. -3 cycles of carboplatin, VP-16 and atezolizumab from 05/06/2019 through 06/17/2019. -CT CAP on 07/07/2019 showed improvement in the right lung cancer.  Interval development of consolidative change in the left upper lobe consistent with infection/inflammation.  2.  Brain metastasis: -MRI of the brain on 05/13/2019 showed small 7 mm ring enhancing lesion at the junction of the left posterior temporal and anterior occipital lobes compatible with solitary metastasis.  Typical appearance of lacunar infarct is also considered. -We will plan to repeat in 2 to 3 months.   PLAN:  1.  Recurrent small cell lung cancer: -I have reviewed CT scan findings  with the patient.  Overall there is improvement in his malignancy. -However there is new infiltrate in the left upper lobe.  Hence I would hold his treatment today.  Chase Herrera has some cough with yellowish expectoration.  I plan to start him on Levaquin 500 mg for 7 days.  We will reevaluate him in 1 week and proceed with his cycle 4. -We will also plan to do MRI prior to next visit.  2.  CKD: -It was elevated at 1.6 on 06/10/2019.  Latest creatinine improved to 1.11.  3.  Diabetes: -Continue Lantus 20 units twice daily and NovoLog sliding scale.  4.  Atrial fibrillation: -Continue Eliquis 5 mg twice daily.  5.  Low back pain: -Continue hydrocodone 5/325 at bedtime as needed.   Orders placed this encounter:  No orders of the defined  types were placed in this encounter.    Derek Jack, MD [place] [phone]   I, Milinda Antis, am acting as a scribe for Dr. Sanda Linger.  I, Derek Jack MD, have reviewed the above documentation for accuracy and completeness, and I agree with the above.

## 2019-07-08 NOTE — Progress Notes (Signed)
Hold treatment today per Dr. Delton Coombes. Port flushed and deaccessed. Pt discharged in satisfactory condition with follow up instructions.

## 2019-07-08 NOTE — Patient Instructions (Signed)
[place] Discharge Instructions  You were seen today by Dr. Delton Coombes. He went over your recent results and scans. You will be prescribed an antibiotic for 1 week. Please continue monitoring for any symptoms of sores or numbness. You will be seen in 1 week with the PA for labs and follow up.   Thank you for choosing [place] at [facility] to provide your oncology and hematology care.  To afford each patient quality time with our provider, please arrive at least 15 minutes before your scheduled appointment time.   If you have a lab appointment with the [facility] please come in thru the Main Entrance and check in at the main information desk  You need to re-schedule your appointment should you arrive 10 or more minutes late.  We strive to give you quality time with our providers, and arriving late affects you and other patients whose appointments are after yours.  Also, if you no show three or more times for appointments you may be dismissed from the clinic at the providers discretion.     Again, thank you for choosing [facility].  Our hope is that these requests will decrease the amount of time that you wait before being seen by our physicians.       _____________________________________________________________  Should you have questions after your visit to [facility], please contact our office at [phone] between the hours of 8:00 a.m. and 4:30 p.m.  Voicemails left after 4:00 p.m. will not be returned until the following business day.  For prescription refill requests, have your pharmacy contact our office and allow 72 hours.    Cancer Center Support Programs:   > Cancer Support Group  2nd Tuesday of the month 1pm-2pm, Journey Room

## 2019-07-08 NOTE — Progress Notes (Signed)
Patient has been assessed, vital signs and labs have been reviewed by Dr. Delton Coombes. Due to possible pneumonia, HOLD treatment today.

## 2019-07-09 ENCOUNTER — Ambulatory Visit (HOSPITAL_COMMUNITY): Payer: Medicare Other

## 2019-07-10 ENCOUNTER — Ambulatory Visit (HOSPITAL_COMMUNITY): Payer: Medicare Other

## 2019-07-13 ENCOUNTER — Ambulatory Visit (HOSPITAL_COMMUNITY): Payer: Medicare Other

## 2019-07-15 ENCOUNTER — Inpatient Hospital Stay (HOSPITAL_COMMUNITY): Payer: Medicare Other

## 2019-07-15 ENCOUNTER — Other Ambulatory Visit: Payer: Self-pay

## 2019-07-15 VITALS — BP 122/60 | HR 74 | Temp 97.9°F | Resp 18

## 2019-07-15 DIAGNOSIS — C3491 Malignant neoplasm of unspecified part of right bronchus or lung: Secondary | ICD-10-CM

## 2019-07-15 DIAGNOSIS — Z5112 Encounter for antineoplastic immunotherapy: Secondary | ICD-10-CM | POA: Diagnosis not present

## 2019-07-15 DIAGNOSIS — Z95828 Presence of other vascular implants and grafts: Secondary | ICD-10-CM

## 2019-07-15 LAB — COMPREHENSIVE METABOLIC PANEL
ALT: 29 U/L (ref 0–44)
AST: 22 U/L (ref 15–41)
Albumin: 3.5 g/dL (ref 3.5–5.0)
Alkaline Phosphatase: 56 U/L (ref 38–126)
Anion gap: 12 (ref 5–15)
BUN: 18 mg/dL (ref 8–23)
CO2: 30 mmol/L (ref 22–32)
Calcium: 9 mg/dL (ref 8.9–10.3)
Chloride: 92 mmol/L — ABNORMAL LOW (ref 98–111)
Creatinine, Ser: 1.08 mg/dL (ref 0.61–1.24)
GFR calc Af Amer: >60 mL/min (ref >60)
GFR calc non Af Amer: >60 mL/min (ref >60)
Glucose, Bld: 321 mg/dL — ABNORMAL HIGH (ref 70–99)
Potassium: 3.9 mmol/L (ref 3.5–5.1)
Sodium: 134 mmol/L — ABNORMAL LOW (ref 135–145)
Total Bilirubin: 0.3 mg/dL (ref 0.3–1.2)
Total Protein: 6.4 g/dL — ABNORMAL LOW (ref 6.5–8.1)

## 2019-07-15 LAB — CBC WITH DIFFERENTIAL/PLATELET
Abs Immature Granulocytes: 0.33 10*3/uL — ABNORMAL HIGH (ref 0.00–0.07)
Basophils Absolute: 0.1 10*3/uL (ref 0.0–0.1)
Basophils Relative: 1 %
Eosinophils Absolute: 0.1 10*3/uL (ref 0.0–0.5)
Eosinophils Relative: 1 %
HCT: 35 % — ABNORMAL LOW (ref 39.0–52.0)
Hemoglobin: 10.5 g/dL — ABNORMAL LOW (ref 13.0–17.0)
Immature Granulocytes: 4 %
Lymphocytes Relative: 7 %
Lymphs Abs: 0.6 10*3/uL — ABNORMAL LOW (ref 0.7–4.0)
MCH: 32.1 pg (ref 26.0–34.0)
MCHC: 30 g/dL (ref 30.0–36.0)
MCV: 107 fL — ABNORMAL HIGH (ref 80.0–100.0)
Monocytes Absolute: 1 10*3/uL (ref 0.1–1.0)
Monocytes Relative: 12 %
Neutro Abs: 6.3 10*3/uL (ref 1.7–7.7)
Neutrophils Relative %: 75 %
Platelets: 207 10*3/uL (ref 150–400)
RBC: 3.27 MIL/uL — ABNORMAL LOW (ref 4.22–5.81)
RDW: 18.4 % — ABNORMAL HIGH (ref 11.5–15.5)
WBC: 8.4 10*3/uL (ref 4.0–10.5)
nRBC: 0.2 % (ref 0.0–0.2)

## 2019-07-15 MED ORDER — SODIUM CHLORIDE 0.9% FLUSH
10.0000 mL | INTRAVENOUS | Status: DC | PRN
  Administered 2019-07-15: 10 mL

## 2019-07-15 MED ORDER — SODIUM CHLORIDE 0.9 % IV SOLN
10.0000 mg | Freq: Once | INTRAVENOUS | Status: AC
  Administered 2019-07-15: 10 mg via INTRAVENOUS
  Filled 2019-07-15: qty 10

## 2019-07-15 MED ORDER — SODIUM CHLORIDE 0.9 % IV SOLN
539.2000 mg | Freq: Once | INTRAVENOUS | Status: AC
  Administered 2019-07-15: 540 mg via INTRAVENOUS
  Filled 2019-07-15: qty 54

## 2019-07-15 MED ORDER — SODIUM CHLORIDE 0.9 % IV SOLN
1200.0000 mg | Freq: Once | INTRAVENOUS | Status: AC
  Administered 2019-07-15: 1200 mg via INTRAVENOUS
  Filled 2019-07-15: qty 20

## 2019-07-15 MED ORDER — HEPARIN SOD (PORK) LOCK FLUSH 100 UNIT/ML IV SOLN
500.0000 [IU] | Freq: Once | INTRAVENOUS | Status: AC | PRN
  Administered 2019-07-15: 500 [IU]

## 2019-07-15 MED ORDER — PALONOSETRON HCL INJECTION 0.25 MG/5ML
0.2500 mg | Freq: Once | INTRAVENOUS | Status: AC
  Administered 2019-07-15: 0.25 mg via INTRAVENOUS
  Filled 2019-07-15: qty 5

## 2019-07-15 MED ORDER — LIDOCAINE-PRILOCAINE 2.5-2.5 % EX CREA: TOPICAL_CREAM | CUTANEOUS | 3 refills | Status: AC

## 2019-07-15 MED ORDER — SODIUM CHLORIDE 0.9 % IV SOLN
150.0000 mg | Freq: Once | INTRAVENOUS | Status: AC
  Administered 2019-07-15: 150 mg via INTRAVENOUS
  Filled 2019-07-15: qty 150

## 2019-07-15 MED ORDER — SODIUM CHLORIDE 0.9 % IV SOLN
80.0000 mg/m2 | Freq: Once | INTRAVENOUS | Status: AC
  Administered 2019-07-15: 190 mg via INTRAVENOUS
  Filled 2019-07-15: qty 9.5

## 2019-07-15 MED ORDER — SODIUM CHLORIDE 0.9 % IV SOLN: Freq: Once | INTRAVENOUS | Status: AC

## 2019-07-15 NOTE — Progress Notes (Signed)
Labs reviewed and are within parameters for treatment today. He is done with his antibiotics , he was held last week by Dr. Charlane Ferretti to treat per Francene Finders NP  Treatment given per orders. Patient tolerated it well without problems. Vitals stable and discharged home from clinic via wheelchair. Follow up as scheduled.

## 2019-07-15 NOTE — Patient Instructions (Signed)
[facility] Discharge Instructions for Patients Receiving Chemotherapy  Today you received the following chemotherapy agents   To help prevent nausea and vomiting after your treatment, we encourage you to take your nausea medication   If you develop nausea and vomiting that is not controlled by your nausea medication, call the clinic.   BELOW ARE SYMPTOMS THAT SHOULD BE REPORTED IMMEDIATELY:  *FEVER GREATER THAN 100.5 F  *CHILLS WITH OR WITHOUT FEVER  NAUSEA AND VOMITING THAT IS NOT CONTROLLED WITH YOUR NAUSEA MEDICATION  *UNUSUAL SHORTNESS OF BREATH  *UNUSUAL BRUISING OR BLEEDING  TENDERNESS IN MOUTH AND THROAT WITH OR WITHOUT PRESENCE OF ULCERS  *URINARY PROBLEMS  *BOWEL PROBLEMS  UNUSUAL RASH Items with * indicate a potential emergency and should be followed up as soon as possible.  Feel free to call the clinic should you have any questions or concerns. The clinic phone number is [phone].  Please show the CHEMO ALERT CARD at check-in to the Emergency Department and triage nurse.   

## 2019-07-16 ENCOUNTER — Inpatient Hospital Stay (HOSPITAL_COMMUNITY): Payer: Medicare Other

## 2019-07-16 VITALS — BP 131/55 | HR 72 | Temp 97.7°F | Resp 18

## 2019-07-16 DIAGNOSIS — Z5112 Encounter for antineoplastic immunotherapy: Secondary | ICD-10-CM | POA: Diagnosis not present

## 2019-07-16 DIAGNOSIS — C3491 Malignant neoplasm of unspecified part of right bronchus or lung: Secondary | ICD-10-CM

## 2019-07-16 MED ORDER — SODIUM CHLORIDE 0.9 % IV SOLN: Freq: Once | INTRAVENOUS | Status: AC

## 2019-07-16 MED ORDER — SODIUM CHLORIDE 0.9% FLUSH
10.0000 mL | INTRAVENOUS | Status: DC | PRN
  Administered 2019-07-16: 10 mL

## 2019-07-16 MED ORDER — HEPARIN SOD (PORK) LOCK FLUSH 100 UNIT/ML IV SOLN
500.0000 [IU] | Freq: Once | INTRAVENOUS | Status: AC | PRN
  Administered 2019-07-16: 500 [IU]

## 2019-07-16 MED ORDER — SODIUM CHLORIDE 0.9 % IV SOLN
80.0000 mg/m2 | Freq: Once | INTRAVENOUS | Status: AC
  Administered 2019-07-16: 190 mg via INTRAVENOUS
  Filled 2019-07-16: qty 9.5

## 2019-07-16 MED ORDER — SODIUM CHLORIDE 0.9 % IV SOLN
10.0000 mg | Freq: Once | INTRAVENOUS | Status: AC
  Administered 2019-07-16: 10 mg via INTRAVENOUS
  Filled 2019-07-16: qty 10

## 2019-07-16 NOTE — Progress Notes (Signed)
Treatment given per orders. Patient tolerated it well without problems. Vitals stable and discharged home from clinic via wheelchair Follow up as scheduled.  

## 2019-07-16 NOTE — Patient Instructions (Signed)
[facility] Discharge Instructions for Patients Receiving Chemotherapy  Today you received the following chemotherapy agents   To help prevent nausea and vomiting after your treatment, we encourage you to take your nausea medication   If you develop nausea and vomiting that is not controlled by your nausea medication, call the clinic.   BELOW ARE SYMPTOMS THAT SHOULD BE REPORTED IMMEDIATELY:  *FEVER GREATER THAN 100.5 F  *CHILLS WITH OR WITHOUT FEVER  NAUSEA AND VOMITING THAT IS NOT CONTROLLED WITH YOUR NAUSEA MEDICATION  *UNUSUAL SHORTNESS OF BREATH  *UNUSUAL BRUISING OR BLEEDING  TENDERNESS IN MOUTH AND THROAT WITH OR WITHOUT PRESENCE OF ULCERS  *URINARY PROBLEMS  *BOWEL PROBLEMS  UNUSUAL RASH Items with * indicate a potential emergency and should be followed up as soon as possible.  Feel free to call the clinic should you have any questions or concerns. The clinic phone number is [phone].  Please show the CHEMO ALERT CARD at check-in to the Emergency Department and triage nurse.   

## 2019-07-17 ENCOUNTER — Other Ambulatory Visit: Payer: Self-pay

## 2019-07-17 ENCOUNTER — Inpatient Hospital Stay (HOSPITAL_COMMUNITY): Payer: Medicare Other

## 2019-07-17 VITALS — BP 121/62 | HR 76 | Temp 97.5°F | Resp 18

## 2019-07-17 DIAGNOSIS — C3491 Malignant neoplasm of unspecified part of right bronchus or lung: Secondary | ICD-10-CM

## 2019-07-17 DIAGNOSIS — Z5112 Encounter for antineoplastic immunotherapy: Secondary | ICD-10-CM | POA: Diagnosis not present

## 2019-07-17 MED ORDER — SODIUM CHLORIDE 0.9% FLUSH
10.0000 mL | INTRAVENOUS | Status: DC | PRN
  Administered 2019-07-17: 10 mL

## 2019-07-17 MED ORDER — SODIUM CHLORIDE 0.9 % IV SOLN: Freq: Once | INTRAVENOUS | Status: AC

## 2019-07-17 MED ORDER — SODIUM CHLORIDE 0.9 % IV SOLN
80.0000 mg/m2 | Freq: Once | INTRAVENOUS | Status: AC
  Administered 2019-07-17: 190 mg via INTRAVENOUS
  Filled 2019-07-17: qty 9.5

## 2019-07-17 MED ORDER — SODIUM CHLORIDE 0.9 % IV SOLN
10.0000 mg | Freq: Once | INTRAVENOUS | Status: AC
  Administered 2019-07-17: 10 mg via INTRAVENOUS
  Filled 2019-07-17: qty 10

## 2019-07-17 MED ORDER — HEPARIN SOD (PORK) LOCK FLUSH 100 UNIT/ML IV SOLN
500.0000 [IU] | Freq: Once | INTRAVENOUS | Status: AC | PRN
  Administered 2019-07-17: 500 [IU]

## 2019-07-17 NOTE — Progress Notes (Signed)
Patient presents today for treatment. Vital signs within parameters for treatment. Patient has no complaints of any changes since his last visit. Patient denies pain today. MAR reviewed.   Treatment given today per MD orders. Tolerated infusion without adverse affects. Vital signs stable. No complaints at this time. Discharged from clinic via wheel chair. F/U with [facility] as scheduled.

## 2019-07-17 NOTE — Patient Instructions (Signed)
[facility] Discharge Instructions for Patients Receiving Chemotherapy  Today you received the following chemotherapy agents   To help prevent nausea and vomiting after your treatment, we encourage you to take your nausea medication   If you develop nausea and vomiting that is not controlled by your nausea medication, call the clinic.   BELOW ARE SYMPTOMS THAT SHOULD BE REPORTED IMMEDIATELY:  *FEVER GREATER THAN 100.5 F  *CHILLS WITH OR WITHOUT FEVER  NAUSEA AND VOMITING THAT IS NOT CONTROLLED WITH YOUR NAUSEA MEDICATION  *UNUSUAL SHORTNESS OF BREATH  *UNUSUAL BRUISING OR BLEEDING  TENDERNESS IN MOUTH AND THROAT WITH OR WITHOUT PRESENCE OF ULCERS  *URINARY PROBLEMS  *BOWEL PROBLEMS  UNUSUAL RASH Items with * indicate a potential emergency and should be followed up as soon as possible.  Feel free to call the clinic should you have any questions or concerns. The clinic phone number is [phone].  Please show the CHEMO ALERT CARD at check-in to the Emergency Department and triage nurse.   

## 2019-07-20 ENCOUNTER — Inpatient Hospital Stay (HOSPITAL_COMMUNITY): Payer: Medicare Other

## 2019-07-20 ENCOUNTER — Encounter (HOSPITAL_COMMUNITY): Payer: Self-pay

## 2019-07-20 VITALS — BP 117/58 | HR 87 | Temp 97.6°F | Resp 18

## 2019-07-20 DIAGNOSIS — Z5112 Encounter for antineoplastic immunotherapy: Secondary | ICD-10-CM | POA: Diagnosis not present

## 2019-07-20 DIAGNOSIS — C3491 Malignant neoplasm of unspecified part of right bronchus or lung: Secondary | ICD-10-CM

## 2019-07-20 MED ORDER — BENZONATATE 200 MG PO CAPS: 200.0000 mg | ORAL_CAPSULE | Freq: Three times a day (TID) | ORAL | 0 refills | Status: DC | PRN

## 2019-07-20 MED ORDER — PEGFILGRASTIM-CBQV 6 MG/0.6ML ~~LOC~~ SOSY
PREFILLED_SYRINGE | SUBCUTANEOUS | Status: AC
  Filled 2019-07-20: qty 0.6

## 2019-07-20 MED ORDER — PEGFILGRASTIM-CBQV 6 MG/0.6ML ~~LOC~~ SOSY
6.0000 mg | PREFILLED_SYRINGE | Freq: Once | SUBCUTANEOUS | Status: AC
  Administered 2019-07-20: 6 mg via SUBCUTANEOUS

## 2019-07-20 NOTE — Patient Instructions (Signed)
[facility] at [facility]  Discharge Instructions:   _______________________________________________________________  Thank you for choosing [facility] at [facility] to provide your oncology and hematology care.  To afford each patient quality time with our providers, please arrive at least 15 minutes before your scheduled appointment.  You need to re-schedule your appointment if you arrive 10 or more minutes late.  We strive to give you quality time with our providers, and arriving late affects you and other patients whose appointments are after yours.  Also, if you no show three or more times for appointments you may be dismissed from the clinic.  Again, thank you for choosing [facility] at [facility]. Our hope is that these requests will allow you access to exceptional care and in a timely manner. _______________________________________________________________  If you have questions after your visit, please contact our office at [phone] between the hours of 8:30 a.m. and 5:00 p.m. Voicemails left after 4:30 p.m. will not be returned until the following business day. _______________________________________________________________  For prescription refill requests, have your pharmacy contact our office. _______________________________________________________________  Recommendations made by the consultant and any test results will be sent to your referring physician. _______________________________________________________________ 

## 2019-07-20 NOTE — Progress Notes (Signed)
Patient presents today for Udenyca injection. Patient has no complaints of any nausea, diarrhea or fatigue at this time. Patient states he is eating and drinking with no current problems. Vital signs stable. Patient has no complaints of any changes since his last visit. Patient denies any shortness of breath or coughing today. Patient requests Tessalon 200mg  capsules refilled.   Udenyca injection given today per MD orders. Tolerated without adverse affects. Vital signs stable. No complaints at this time. Discharged from clinic via wheel chair. F/U with [facility] as scheduled.

## 2019-07-23 ENCOUNTER — Other Ambulatory Visit: Payer: Self-pay

## 2019-07-23 ENCOUNTER — Encounter: Payer: Self-pay | Admitting: Family Medicine

## 2019-07-23 ENCOUNTER — Ambulatory Visit (INDEPENDENT_AMBULATORY_CARE_PROVIDER_SITE_OTHER): Payer: Medicare Other | Admitting: Family Medicine

## 2019-07-23 ENCOUNTER — Ambulatory Visit: Payer: Medicare Other | Admitting: Cardiology

## 2019-07-23 VITALS — BP 132/70 | HR 87 | Ht 73.0 in | Wt 241.2 lb

## 2019-07-23 DIAGNOSIS — I4819 Other persistent atrial fibrillation: Secondary | ICD-10-CM | POA: Diagnosis not present

## 2019-07-23 DIAGNOSIS — J449 Chronic obstructive pulmonary disease, unspecified: Secondary | ICD-10-CM

## 2019-07-23 DIAGNOSIS — R600 Localized edema: Secondary | ICD-10-CM

## 2019-07-23 DIAGNOSIS — I428 Other cardiomyopathies: Secondary | ICD-10-CM | POA: Diagnosis not present

## 2019-07-23 DIAGNOSIS — C3490 Malignant neoplasm of unspecified part of unspecified bronchus or lung: Secondary | ICD-10-CM

## 2019-07-23 NOTE — Progress Notes (Signed)
Cardiology Office Note  Date: 07/23/2019   ID: Chase Herrera, Chase Herrera Aug 24, 1950, MRN [id number]  PCP:  Gwenlyn Saran, [place]  Cardiologist:  [name], MD Electrophysiologist:  None   Chief Complaint: Follow-up atrial fibrillation and flutter  History of Present Illness: Chase Herrera is a 69 y.o. male with a history of atrial fibrillation/flutter  Last seen by Dr. [name] on 01/19/2019.  He continued to report improvement in palpitations since starting Tikosyn.  He was having no problems with spontaneous bleeding on Xarelto.  His EKG showed he was in normal sinus rhythm with poor R wave progression as prior with QTC of 457 ms.  Had not pursued follow-up for discussion of an ablation with Dr. Willis Modena at [facility].  He was severely oxygen dependent COPD with chronic respiratory failure.  History of nonischemic cardiomyopathy with normalization of EF.  History of small cell lung cancer continues to follow-up with oncology.  He presents today with no particular complaints.  He denies any progressive anginal symptoms.  He does have baseline shortness of breath due to COPD and is oxygen dependent wearing O2 by nasal cannula today resting in a wheelchair.  States she has been restarted on chemotherapy along with immunotherapy with Dr. Delton Coombes oncology.  Denies any palpitations or arrhythmias, orthostatic symptoms, CVA or TIA-like symptoms, bleeding issues, claudication-like symptoms, DVT or PE.  He does have some chronic lower extremity edema.  Denies any issues on Tikosyn therapy.  No arrhythmias noted per patient  Past Medical History:  Diagnosis Date  . Alcohol abuse    Quit 08/23/11  . Arthritis   . Atrial fibrillation ([place])   . Atrial flutter ([place])    CTI ablation by Dr [name] 10/2011  . Bacteremia due to Gram-positive bacteria 11/29/2016  . CHF (congestive heart failure) ([place])   . COPD (chronic obstructive pulmonary disease) ([place])   . Depression   . Essential  hypertension   . History of cardiomyopathy    LVEF 25-30% 08/2011 with subsequent normalization  . History of kidney stones   . Obesity   . Prostate enlargement   . Small cell lung cancer ([place])    Right upper lobe - follows with [facility]  . Type 2 diabetes mellitus ([place])     Past Surgical History:  Procedure Laterality Date  . APPENDECTOMY    . ATRIAL ABLATION SURGERY  11/06/2011   CTI ablation for atrial flutter by Dr [name]  . ATRIAL FLUTTER ABLATION N/A 11/06/2011   Procedure: ATRIAL FLUTTER ABLATION;  Surgeon: Thompson Grayer, MD;  Location: [facility] CATH LAB;  Service: Cardiovascular;  Laterality: N/A;  . Cataracts    . COLONOSCOPY WITH PROPOFOL N/A 06/06/2017   Procedure: COLONOSCOPY WITH PROPOFOL;  Surgeon: [name], MD;  Location: [facility] ENDO SUITE;  Service: Endoscopy;  Laterality: N/A;  1:15pm  . IR GENERIC HISTORICAL  04/09/2016   IR US GUIDE VASC ACCESS RIGHT 04/09/2016 [name], DO [facility]-INTERV RAD  . IR GENERIC HISTORICAL  04/09/2016   IR FLUORO GUIDE PORT INSERTION RIGHT 04/09/2016 [name], DO [facility]-INTER[facility]  . PORT-A-CATH REMOVAL N/A 04/24/2017   Procedure: MINOR REMOVAL PORT-A-CATH;  Surgeon: [name], MD;  Location: [facility] ORS;  Service: General;  Laterality: N/A;  . PORTACATH PLACEMENT Left 04/22/2019   Procedure: INSERTION PORT-A-CATH;  Surgeon: Aviva Signs, MD;  Location: [facility] ORS;  Service: General;  Laterality: Left;  . TONSILLECTOMY      Current Outpatient Medications  Medication Sig Dispense  Refill  . acetaminophen (TYLENOL) 500 MG tablet Take 1,000 mg by mouth every 6 (six) hours as needed for moderate pain or headache.    Marland Kitchen apixaban (ELIQUIS) 5 MG TABS tablet Take 5 mg by mouth 2 (two) times daily.     Marland Kitchen AQUALANCE LANCETS 30G MISC USE TO check blood glucose twice daily  99  . Artificial Saliva (BIOTENE DRY MOUTH MOISTURIZING) SOLN Apply 1 Dose topically as needed (dry mouth).     Marland Kitchen atorvastatin (LIPITOR) 20 MG tablet Take 1 tablet (20 mg total) by  mouth daily. 90 tablet 3  . benzonatate (TESSALON) 200 MG capsule Take 1 capsule (200 mg total) by mouth 3 (three) times daily as needed for cough. 30 capsule 0  . Carboxymethylcellul-Glycerin (LUBRICATING EYE DROPS OP) Place 1 drop into the left eye daily as needed (dry eyes).    Marland Kitchen diltiazem (DILACOR XR) 240 MG 24 hr capsule Take 240 mg by mouth daily.    Marland Kitchen dofetilide (TIKOSYN) 250 MCG capsule Take 1 capsule (250 mcg total) by mouth 2 (two) times daily.    . fluticasone (FLONASE) 50 MCG/ACT nasal spray Place 2 sprays into both nostrils daily. 16 g 6  . Fluticasone-Umeclidin-Vilant (TRELEGY ELLIPTA) 100-62.5-25 MCG/INH AEPB Inhale 1 puff into the lungs daily.    . furosemide (LASIX) 40 MG tablet TAKE 1 TABLET BY MOUTH EVERY DAY 90 tablet 0  . gabapentin (NEURONTIN) 300 MG capsule Take 600 mg by mouth 2 (two) times daily.     Marland Kitchen guaifenesin (ROBITUSSIN) 100 MG/5ML syrup Take 200 mg by mouth at bedtime as needed for cough.    Marland Kitchen HM SENNA 8.6 MG tablet Take 2 tablets by mouth at bedtime.    Marland Kitchen HYDROcodone-acetaminophen (NORCO/VICODIN) 5-325 MG tablet Take 1 tablet by mouth at bedtime as needed. 30 tablet 0  . hydrOXYzine (VISTARIL) 25 MG capsule Take 25 mg by mouth every 8 (eight) hours.    . insulin lispro (HUMALOG) 100 UNIT/ML injection Inject 2-20 Units into the skin 3 (three) times daily with meals. Sliding scale     . Insulin Syringe-Needle U-100 28G X 1/2" 1 ML MISC 1 each by Does not apply route 3 (three) times daily. 100 each 3  . ipratropium-albuterol (DUONEB) 0.5-2.5 (3) MG/3ML SOLN Take 3 mLs by nebulization every 4 (four) hours as needed (shortness of breath).     Elmore Guise Devices (ADJUSTABLE LANCING DEVICE) MISC TO check blood glucose daily    . LANTUS SOLOSTAR 100 UNIT/ML Solostar Pen INJECT 20 UNITS UNDER THE SKIN EVERY MORNING AND INJECT 20 UNITS UNDER THE SKIN EVERY EVENING (Patient taking differently: Inject 20 Units into the skin 2 (two) times daily. ) 45 mL 0  . levalbuterol (XOPENEX)  0.63 MG/3ML nebulizer solution Inhale 3 mLs (0.63 mg total) into the lungs 4 (four) times daily. 3 mL 0  . Lidocaine (SALONPAS PAIN RELIEVING EX) Apply 1 patch topically daily.     Marland Kitchen lidocaine-prilocaine (EMLA) cream Apply a small amount to port a cath site and cover with plastic wrap 1 hour prior to chemotherapy appointments 30 g 3  . metFORMIN (GLUCOPHAGE) 500 MG tablet Take 500 mg by mouth in the morning and at bedtime.    . metoprolol succinate (TOPROL-XL) 100 MG 24 hr tablet Take 100 mg by mouth daily.    . Multiple Vitamin (MULTIVITAMIN WITH MINERALS) TABS tablet Take 1 tablet by mouth daily.    . potassium chloride (KLOR-CON) 10 MEQ tablet Take 10 mEq by mouth 2 (  two) times daily.    . predniSONE (DELTASONE) 20 MG tablet Take 60 mg by mouth daily.    . predniSONE (STERAPRED UNI-PAK 48 TAB) 5 MG (48) TBPK tablet TAKE DOSE PACK AS DIRECTED PER PACKAGE INSTRUCTIONS    . pregabalin (LYRICA) 150 MG capsule Take 150 mg by mouth in the morning and at bedtime.    Marland Kitchen PROAIR HFA 108 (90 Base) MCG/ACT inhaler INHALE TWO PUFFS BY MOUTH EVERY 4 HOURS AS NEEDED FOR WHEEZING 17 g 0  . prochlorperazine (COMPAZINE) 10 MG tablet Take 1 tablet (10 mg total) by mouth every 6 (six) hours as needed (Nausea or vomiting). 60 tablet 1  . senna-docusate (SENOKOT-S) 8.6-50 MG tablet Take 2 tablets by mouth at bedtime. 60 tablet 0  . spironolactone (ALDACTONE) 25 MG tablet Take 12.5 mg by mouth 2 (two) times daily.     . tamsulosin (FLOMAX) 0.4 MG CAPS capsule Take 1 capsule (0.4 mg total) by mouth 2 (two) times daily after a meal. 180 capsule 1  . tiotropium (SPIRIVA HANDIHALER) 18 MCG inhalation capsule Place 1 capsule (18 mcg total) into inhaler and inhale daily. 30 capsule 12  . traZODone (DESYREL) 50 MG tablet Take 50 mg by mouth at bedtime.  5   No current facility-administered medications for this visit.   Allergies:  Patient has no known allergies.   Social History: The patient  reports that he quit smoking  about 7 years ago. His smoking use included cigarettes. He has a 23.50 pack-year smoking history. He quit smokeless tobacco use about 5 years ago. He reports previous drug use. He reports that he does not drink alcohol.   Family History: The patient's family history includes Breast cancer in his cousin; Diabetes in his sister; Heart attack in his father; Hypertension in his father, mother, sister, sister, and sister; Lung cancer in his mother; Pulmonary fibrosis in his sister.   ROS:  Please see the history of present illness. Otherwise, complete review of systems is positive for none.  All other systems are reviewed and negative.   Physical Exam: VS:  BP 132/70   Pulse 87   Ht 6\' 1"  (1.854 m)   Wt 241 lb 3.2 oz (109.4 kg)   SpO2 95%   BMI 31.82 kg/m , BMI Body mass index is 31.82 kg/m.  Wt Readings from Last 3 Encounters:  07/23/19 241 lb 3.2 oz (109.4 kg)  07/15/19 248 lb 11.2 oz (112.8 kg)  07/08/19 241 lb 11.2 oz (109.6 kg)    General: Patient appears comfortable at rest. Neck: Supple, no elevated JVP or carotid bruits, no thyromegaly. Lungs: Decreased breath sounds with prolonged expiratory phase, nonlabored breathing at rest. Cardiac: Regular rate and rhythm, no S3 or significant systolic murmur, no pericardial rub. Extremities: Mild pitting edema, distal pulses 2+. Skin: Warm and dry. Musculoskeletal: No kyphosis. Neuropsychiatric: Alert and oriented x3, affect grossly appropriate.  ECG:  .  EKG 07/02/2019 showed sinus rhythm rate of 95  Recent Labwork: 05/15/2019: B Natriuretic Peptide 48.0 06/17/2019: TSH 2.915 07/01/2019: Magnesium 1.5 07/15/2019: ALT 29; AST 22; BUN 18; Creatinine, Ser 1.08; Hemoglobin 10.5; Platelets 207; Potassium 3.9; Sodium 134     Component Value Date/Time   CHOL 100 06/03/2017 1124   TRIG 103 06/03/2017 1124   HDL 40 (L) 06/03/2017 1124   CHOLHDL 2.5 06/03/2017 1124   LDLCALC 41 06/03/2017 1124    Other Studies Reviewed  Today:  [facility] LVEF 55 to 60%, normal left atrial chamber  size, normal right ventricular contraction, no pericardial effusion  Assessment and Plan:  1. Persistent atrial fibrillation ([place])   2. COPD, severe ([place])   3. Nonischemic cardiomyopathy ([place])   4. Small cell lung cancer ([place])   5. Bilateral lower extremity edema     1. Persistent atrial fibrillation ([facility]) Heart rate is regular today with a rate of 87.  Continues on Tikosyn 250 mcg p.o. twice daily.  Continue Eliquis 5 mg p.o. twice daily.  2. COPD, severe ([place]) Severe COPD along with lung CA.  On continuous O2.  Denies any significant dyspnea resting in a wheelchair today with O2 2 L via oxygen concentrator.  3. Nonischemic cardiomyopathy ([place]) Resolved see echo report above.  4. Small cell lung cancer ([place]) Currently undergoing chemo and immunotherapy with oncology Dr. Eulas Post got to in [place] at [facility].  States he is feeling better since starting immunotherapy  5 LE edema Continue furosemide 40 mg daily.  Continues with chronic lower extremity edema  Medication Adjustments/Labs and Tests Ordered: Current medicines are reviewed at length with the patient today.  Concerns regarding medicines are outlined above.   Disposition: Follow-up with Dr. [name] or APP 6 months  Signed, Levell July, NP 07/23/2019 10:29 AM    [facility] [place], [place], [place] [address] Phone: [phone]; Fax: [phone]

## 2019-07-23 NOTE — Patient Instructions (Signed)
Medication Instructions:  Continue all current medications.   Labwork: none  Testing/Procedures: none  Follow-Up: 6 months   Any Other Special Instructions Will Be Listed Below (If Applicable).   If you need a refill on your cardiac medications before your next appointment, please call your pharmacy.  

## 2019-07-31 ENCOUNTER — Other Ambulatory Visit: Payer: Self-pay

## 2019-07-31 ENCOUNTER — Ambulatory Visit (HOSPITAL_COMMUNITY)
Admission: RE | Admit: 2019-07-31 | Discharge: 2019-07-31 | Disposition: A | Discharge status: Home / Self Care | Payer: Medicare Other | Source: Ambulatory Visit | Attending: Hematology | Admitting: Hematology

## 2019-07-31 DIAGNOSIS — C3491 Malignant neoplasm of unspecified part of right bronchus or lung: Secondary | ICD-10-CM | POA: Diagnosis not present

## 2019-07-31 MED ORDER — GADOBUTROL 1 MMOL/ML IV SOLN
10.0000 mL | Freq: Once | INTRAVENOUS | Status: AC | PRN
  Administered 2019-07-31: 10 mL via INTRAVENOUS

## 2019-08-05 ENCOUNTER — Inpatient Hospital Stay (HOSPITAL_COMMUNITY): Payer: Medicare Other

## 2019-08-05 ENCOUNTER — Inpatient Hospital Stay (HOSPITAL_COMMUNITY): Payer: Medicare Other | Attending: Hematology

## 2019-08-05 ENCOUNTER — Inpatient Hospital Stay (HOSPITAL_BASED_OUTPATIENT_CLINIC_OR_DEPARTMENT_OTHER): Payer: Medicare Other | Admitting: Hematology

## 2019-08-05 VITALS — BP 107/53 | HR 73 | Temp 97.3°F | Resp 18

## 2019-08-05 VITALS — BP 134/71 | HR 86 | Temp 96.9°F | Resp 18 | Wt 243.8 lb

## 2019-08-05 DIAGNOSIS — Z87442 Personal history of urinary calculi: Secondary | ICD-10-CM | POA: Diagnosis not present

## 2019-08-05 DIAGNOSIS — Z7901 Long term (current) use of anticoagulants: Secondary | ICD-10-CM | POA: Insufficient documentation

## 2019-08-05 DIAGNOSIS — R200 Anesthesia of skin: Secondary | ICD-10-CM | POA: Diagnosis not present

## 2019-08-05 DIAGNOSIS — C3491 Malignant neoplasm of unspecified part of right bronchus or lung: Secondary | ICD-10-CM | POA: Diagnosis not present

## 2019-08-05 DIAGNOSIS — G319 Degenerative disease of nervous system, unspecified: Secondary | ICD-10-CM | POA: Insufficient documentation

## 2019-08-05 DIAGNOSIS — Z5189 Encounter for other specified aftercare: Secondary | ICD-10-CM | POA: Diagnosis not present

## 2019-08-05 DIAGNOSIS — Z5112 Encounter for antineoplastic immunotherapy: Secondary | ICD-10-CM | POA: Insufficient documentation

## 2019-08-05 DIAGNOSIS — Z8249 Family history of ischemic heart disease and other diseases of the circulatory system: Secondary | ICD-10-CM | POA: Insufficient documentation

## 2019-08-05 DIAGNOSIS — M549 Dorsalgia, unspecified: Secondary | ICD-10-CM | POA: Insufficient documentation

## 2019-08-05 DIAGNOSIS — M545 Low back pain: Secondary | ICD-10-CM | POA: Insufficient documentation

## 2019-08-05 DIAGNOSIS — Z79899 Other long term (current) drug therapy: Secondary | ICD-10-CM | POA: Diagnosis not present

## 2019-08-05 DIAGNOSIS — Z87891 Personal history of nicotine dependence: Secondary | ICD-10-CM | POA: Insufficient documentation

## 2019-08-05 DIAGNOSIS — Z833 Family history of diabetes mellitus: Secondary | ICD-10-CM | POA: Insufficient documentation

## 2019-08-05 DIAGNOSIS — I4891 Unspecified atrial fibrillation: Secondary | ICD-10-CM | POA: Insufficient documentation

## 2019-08-05 DIAGNOSIS — C3411 Malignant neoplasm of upper lobe, right bronchus or lung: Secondary | ICD-10-CM | POA: Diagnosis present

## 2019-08-05 DIAGNOSIS — N189 Chronic kidney disease, unspecified: Secondary | ICD-10-CM | POA: Diagnosis not present

## 2019-08-05 DIAGNOSIS — I700 Atherosclerosis of aorta: Secondary | ICD-10-CM | POA: Diagnosis not present

## 2019-08-05 DIAGNOSIS — R5383 Other fatigue: Secondary | ICD-10-CM | POA: Insufficient documentation

## 2019-08-05 DIAGNOSIS — C7931 Secondary malignant neoplasm of brain: Secondary | ICD-10-CM | POA: Diagnosis not present

## 2019-08-05 DIAGNOSIS — R222 Localized swelling, mass and lump, trunk: Secondary | ICD-10-CM | POA: Insufficient documentation

## 2019-08-05 DIAGNOSIS — Z801 Family history of malignant neoplasm of trachea, bronchus and lung: Secondary | ICD-10-CM | POA: Insufficient documentation

## 2019-08-05 DIAGNOSIS — E1122 Type 2 diabetes mellitus with diabetic chronic kidney disease: Secondary | ICD-10-CM | POA: Diagnosis not present

## 2019-08-05 DIAGNOSIS — Z803 Family history of malignant neoplasm of breast: Secondary | ICD-10-CM | POA: Insufficient documentation

## 2019-08-05 DIAGNOSIS — Z5111 Encounter for antineoplastic chemotherapy: Secondary | ICD-10-CM | POA: Insufficient documentation

## 2019-08-05 DIAGNOSIS — Z836 Family history of other diseases of the respiratory system: Secondary | ICD-10-CM | POA: Insufficient documentation

## 2019-08-05 LAB — COMPREHENSIVE METABOLIC PANEL
ALT: 40 U/L (ref 0–44)
AST: 25 U/L (ref 15–41)
Albumin: 3.5 g/dL (ref 3.5–5.0)
Alkaline Phosphatase: 69 U/L (ref 38–126)
Anion gap: 14 (ref 5–15)
BUN: 20 mg/dL (ref 8–23)
CO2: 27 mmol/L (ref 22–32)
Calcium: 8.9 mg/dL (ref 8.9–10.3)
Chloride: 93 mmol/L — ABNORMAL LOW (ref 98–111)
Creatinine, Ser: 1.17 mg/dL (ref 0.61–1.24)
GFR calc Af Amer: >60 mL/min (ref >60)
GFR calc non Af Amer: >60 mL/min (ref >60)
Glucose, Bld: 393 mg/dL — ABNORMAL HIGH (ref 70–99)
Potassium: 4.3 mmol/L (ref 3.5–5.1)
Sodium: 134 mmol/L — ABNORMAL LOW (ref 135–145)
Total Bilirubin: 0.5 mg/dL (ref 0.3–1.2)
Total Protein: 6.2 g/dL — ABNORMAL LOW (ref 6.5–8.1)

## 2019-08-05 LAB — CBC WITH DIFFERENTIAL/PLATELET
Abs Immature Granulocytes: 0.38 10*3/uL — ABNORMAL HIGH (ref 0.00–0.07)
Basophils Absolute: 0.1 10*3/uL (ref 0.0–0.1)
Basophils Relative: 1 %
Eosinophils Absolute: 0 10*3/uL (ref 0.0–0.5)
Eosinophils Relative: 0 %
HCT: 34.2 % — ABNORMAL LOW (ref 39.0–52.0)
Hemoglobin: 10.6 g/dL — ABNORMAL LOW (ref 13.0–17.0)
Immature Granulocytes: 5 %
Lymphocytes Relative: 7 %
Lymphs Abs: 0.6 10*3/uL — ABNORMAL LOW (ref 0.7–4.0)
MCH: 33.3 pg (ref 26.0–34.0)
MCHC: 31 g/dL (ref 30.0–36.0)
MCV: 107.5 fL — ABNORMAL HIGH (ref 80.0–100.0)
Monocytes Absolute: 0.9 10*3/uL (ref 0.1–1.0)
Monocytes Relative: 11 %
Neutro Abs: 6.1 10*3/uL (ref 1.7–7.7)
Neutrophils Relative %: 76 %
Platelets: 146 10*3/uL — ABNORMAL LOW (ref 150–400)
RBC: 3.18 MIL/uL — ABNORMAL LOW (ref 4.22–5.81)
RDW: 17.6 % — ABNORMAL HIGH (ref 11.5–15.5)
WBC: 8 10*3/uL (ref 4.0–10.5)
nRBC: 0.4 % — ABNORMAL HIGH (ref 0.0–0.2)

## 2019-08-05 MED ORDER — SODIUM CHLORIDE 0.9 % IV SOLN
505.6000 mg | Freq: Once | INTRAVENOUS | Status: AC
  Administered 2019-08-05: 510 mg via INTRAVENOUS
  Filled 2019-08-05: qty 51

## 2019-08-05 MED ORDER — SODIUM CHLORIDE 0.9% FLUSH
10.0000 mL | INTRAVENOUS | Status: DC | PRN
  Administered 2019-08-05: 10 mL

## 2019-08-05 MED ORDER — SODIUM CHLORIDE 0.9 % IV SOLN
80.0000 mg/m2 | Freq: Once | INTRAVENOUS | Status: AC
  Administered 2019-08-05: 190 mg via INTRAVENOUS
  Filled 2019-08-05: qty 9.5

## 2019-08-05 MED ORDER — PALONOSETRON HCL INJECTION 0.25 MG/5ML
0.2500 mg | Freq: Once | INTRAVENOUS | Status: AC
  Administered 2019-08-05: 0.25 mg via INTRAVENOUS
  Filled 2019-08-05: qty 5

## 2019-08-05 MED ORDER — HYDROCODONE-ACETAMINOPHEN 5-325 MG PO TABS: 1.0000 | ORAL_TABLET | Freq: Every evening | ORAL | 0 refills | Status: DC | PRN

## 2019-08-05 MED ORDER — SODIUM CHLORIDE 0.9 % IV SOLN: Freq: Once | INTRAVENOUS | Status: AC

## 2019-08-05 MED ORDER — SODIUM CHLORIDE 0.9 % IV SOLN
1200.0000 mg | Freq: Once | INTRAVENOUS | Status: AC
  Administered 2019-08-05: 1200 mg via INTRAVENOUS
  Filled 2019-08-05: qty 20

## 2019-08-05 MED ORDER — SODIUM CHLORIDE 0.9 % IV SOLN
10.0000 mg | Freq: Once | INTRAVENOUS | Status: AC
  Administered 2019-08-05: 10 mg via INTRAVENOUS
  Filled 2019-08-05: qty 1

## 2019-08-05 MED ORDER — SODIUM CHLORIDE 0.9 % IV SOLN
150.0000 mg | Freq: Once | INTRAVENOUS | Status: AC
  Administered 2019-08-05: 150 mg via INTRAVENOUS
  Filled 2019-08-05: qty 150

## 2019-08-05 MED ORDER — HEPARIN SOD (PORK) LOCK FLUSH 100 UNIT/ML IV SOLN
500.0000 [IU] | Freq: Once | INTRAVENOUS | Status: AC | PRN
  Administered 2019-08-05: 500 [IU]

## 2019-08-05 NOTE — Progress Notes (Signed)
Chase Herrera, Chase Herrera [id number]   CLINIC:  Medical Oncology/Hematology  PCP:  [facility] [place] / [place] [address] [phone]   REASON FOR VISIT:  Follow-up for small cell lung cancer  PRIOR THERAPY: Carboplatin, VP-16 and atezolizumab x 3 cycles from 05/06/2019 to 06/17/2019  CURRENT THERAPY: Tecentriq  BRIEF ONCOLOGIC HISTORY:  Oncology History  Small cell lung cancer ([place])  04/03/2016 Initial Diagnosis   Small cell lung cancer ([place])   04/09/2016 Imaging   MRI brain- Negative for metastatic disease to the brain. No acute abnormality.   04/09/2016 Procedure   Right IJ port catheter placement by IR   04/13/2016 PET scan   . The right apical lung mass has significantly enlarged and there is new right paratracheal adenopathy as well as a hypermetabolic new lymph node in the right inner infraclavicular fossa potentially with early impingement on adjacent neurovascular structures. 2. Other imaging findings of potential clinical significance: Chronic bilateral maxillary sinusitis. Left carotid atherosclerotic calcification. Aortoiliac atherosclerotic vascular disease. Coronary atherosclerosis. Paraseptal emphysema. Mild atelectasis in the lung bases. Scattered sigmoid colon diverticula. Probable right pars defect at L5.   05/09/2016 - 07/12/2016 Radiation Therapy   Radiotherapy to right Lung+MS 63 Gy/35 fractions at 1.8 GY/f using 6 x photons 3- D CRT technique Dates: 4/18-6/21/18       06/19/2016 Treatment Plan Change   Cisplatin dose reduced by 20% for cycle #4 due to severe thrombocytopenia following cycle #3 (13,000).   08/21/2016 PET scan   1. Marked reduction in size and metabolic activity of RIGHT upper lobe mass. 2. Resolution of mediastinal and RIGHT supraclavicular lymphadenopathy. 3. No evidence of disease progression.   05/06/2019 -  Chemotherapy   The patient had palonosetron  (ALOXI) injection 0.25 mg, 0.25 mg, Intravenous,  Once, 4 of 4 cycles Administration: 0.25 mg (05/06/2019), 0.25 mg (05/27/2019), 0.25 mg (06/17/2019), 0.25 mg (07/15/2019) pegfilgrastim-cbqv (UDENYCA) injection 6 mg, 6 mg, Subcutaneous, Once, 4 of 4 cycles Administration: 6 mg (05/11/2019), 6 mg (06/01/2019), 6 mg (06/23/2019), 6 mg (07/20/2019) CARBOplatin (PARAPLATIN) 450 mg in sodium chloride 0.9 % 250 mL chemo infusion, 450 mg (100 % of original dose 446.4 mg), Intravenous,  Once, 4 of 4 cycles Dose modification:   (original dose 446.4 mg, Cycle 1, Reason: Provider Judgment),   (original dose 500 mg, Cycle 2, Reason: Provider Judgment), 500 mg (original dose 500 mg, Cycle 2, Reason: Provider Judgment), 500 mg (original dose 500 mg, Cycle 3),   (original dose 527.6 mg, Cycle 4, Reason: Provider Judgment) Administration: 450 mg (05/06/2019), 500 mg (05/27/2019), 500 mg (06/17/2019), 540 mg (07/15/2019) etoposide (VEPESID) 190 mg in sodium chloride 0.9 % 500 mL chemo infusion, 80 mg/m2 = 190 mg (80 % of original dose 100 mg/m2), Intravenous,  Once, 4 of 4 cycles Dose modification: 80 mg/m2 (80 % of original dose 100 mg/m2, Cycle 1, Reason: Provider Judgment), 80 mg/m2 (original dose 100 mg/m2, Cycle 1, Reason: Provider Judgment), 80 mg/m2 (80 % of original dose 100 mg/m2, Cycle 2, Reason: Provider Judgment) Administration: 190 mg (05/06/2019), 190 mg (05/07/2019), 190 mg (05/08/2019), 190 mg (05/27/2019), 190 mg (05/28/2019), 190 mg (05/29/2019), 190 mg (06/17/2019), 190 mg (06/18/2019), 190 mg (06/19/2019), 190 mg (07/15/2019), 190 mg (07/16/2019), 190 mg (07/17/2019) fosaprepitant (EMEND) 150 mg in sodium chloride 0.9 % 145 mL IVPB, 150 mg, Intravenous,  Once, 4 of 4 cycles Administration: 150 mg (05/06/2019), 150 mg (05/27/2019), 150 mg (06/17/2019), 150 mg (07/15/2019)  atezolizumab (TECENTRIQ) 1,200 mg in sodium chloride 0.9 % 250 mL chemo infusion, 1,200 mg, Intravenous, Once, 4 of 8 cycles Administration: 1,200 mg (05/06/2019),  1,200 mg (05/27/2019), 1,200 mg (06/17/2019), 1,200 mg (07/15/2019)  for chemotherapy treatment.      CANCER STAGING: Cancer Staging Small cell lung cancer ([place]) Staging form: Lung, AJCC 8th Edition - Clinical: Stage IIIC (cT3, cN3, cM0) - Signed by Twana First, MD on 04/16/2016   INTERVAL HISTORY:  [name], a 69 y.o. male, returns for routine follow-up and consideration for next cycle of chemotherapy. Chase Herrera was last seen on 07/08/2019.  Due for cycle #5 of Tecentriq today.   Overall, today he tells me he has been feeling pretty well. He tolerated the last chemo treatment well, and denies N/V/D. He denies having any hematuria, hematochezia, mouth sores, or new coughs. He reports having lower back pain and is requesting pain meds. He denies having any falls recently. He is on 2 L [place].  Overall, he feels ready for next cycle of chemo today.    REVIEW OF SYSTEMS:  Review of Systems  Constitutional: Positive for fatigue (moderate). Negative for appetite change.  Respiratory: Negative for cough.   Cardiovascular: Positive for leg swelling.  Gastrointestinal: Negative for blood in stool.  Genitourinary: Negative for hematuria.   Musculoskeletal: Positive for back pain (6/10 back pain).  Neurological: Positive for numbness (hands & feet).  All other systems reviewed and are negative.   PAST MEDICAL/SURGICAL HISTORY:  Past Medical History:  Diagnosis Date  . Alcohol abuse    Quit 08/23/11  . Arthritis   . Atrial fibrillation ([place])   . Atrial flutter ([place])    CTI ablation by Dr [name] 10/2011  . Bacteremia due to Gram-positive bacteria 11/29/2016  . CHF (congestive heart failure) ([place])   . COPD (chronic obstructive pulmonary disease) ([place])   . Depression   . Essential hypertension   . History of cardiomyopathy    LVEF 25-30% 08/2011 with subsequent normalization  . History of kidney stones   . Obesity   . Prostate enlargement   . Small cell lung cancer ([place])    Right  upper lobe - follows with [facility]  . Type 2 diabetes mellitus ([place])    Past Surgical History:  Procedure Laterality Date  . APPENDECTOMY    . ATRIAL ABLATION SURGERY  11/06/2011   CTI ablation for atrial flutter by Dr [name]  . ATRIAL FLUTTER ABLATION N/A 11/06/2011   Procedure: ATRIAL FLUTTER ABLATION;  Surgeon: Thompson Grayer, MD;  Location: [facility] CATH LAB;  Service: Cardiovascular;  Laterality: N/A;  . Cataracts    . COLONOSCOPY WITH PROPOFOL N/A 06/06/2017   Procedure: COLONOSCOPY WITH PROPOFOL;  Surgeon: [name], MD;  Location: [facility] ENDO SUITE;  Service: Endoscopy;  Laterality: N/A;  1:15pm  . IR GENERIC HISTORICAL  04/09/2016   IR US GUIDE VASC ACCESS RIGHT 04/09/2016 [name], DO [facility]-INTERV RAD  . IR GENERIC HISTORICAL  04/09/2016   IR FLUORO GUIDE PORT INSERTION RIGHT 04/09/2016 [name], DO [facility]-INTERV RAD  . PORT-A-CATH REMOVAL N/A 04/24/2017   Procedure: MINOR REMOVAL PORT-A-CATH;  Surgeon: [name], MD;  Location: [facility] ORS;  Service: General;  Laterality: N/A;  . PORTACATH PLACEMENT Left 04/22/2019   Procedure: INSERTION PORT-A-CATH;  Surgeon: Aviva Signs, MD;  Location: [facility] ORS;  Service: General;  Laterality: Left;  . TONSILLECTOMY      SOCIAL HISTORY:  Social History   Socioeconomic History  . Marital  status: Single    Spouse name: Not on file  . Number of children: Not on file  . Years of education: Not on file  . Highest education level: Not on file  Occupational History  . Occupation: retired     Comment: Building services engineer  Tobacco Use  . Smoking status: Former Smoker    Packs/day: 0.50    Years: 47.00    Pack years: 23.50    Types: Cigarettes    Quit date: 05/30/2012    Years since quitting: 7.1  . Smokeless tobacco: Former Systems developer    Quit date: 04/10/2014  . Tobacco comment: encouraged to quit today 03/04/12  Vaping Use  . Vaping Use: Never used  Substance and Sexual Activity  . Alcohol use: No    Comment: quit 3 1/2 years ago  . Drug use:  Not Currently    Comment: marajuana occasionally  . Sexual activity: Not Currently  Other Topics Concern  . Not on file  Social History Narrative   Lives in [place] alone.   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 Difficulty of Paying Living Expenses:   Food Insecurity:   . Worried About Charity fundraiser in the Last Year:   . Arboriculturist in the Last Year:   Transportation Needs:   . Film/video editor (Medical):   Marland Kitchen Lack of Transportation (Non-Medical):   Physical Activity:   . Days of Exercise per Week:   . Minutes of Exercise per Session:   Stress:   . Feeling of Stress :   Social Connections:   . Frequency of Communication with Friends and Family:   . Frequency of Social Gatherings with Friends and Family:   . Attends Religious Services:   . Active Member of Clubs or Organizations:   . Attends Archivist Meetings:   Marland Kitchen Marital Status:   Intimate Partner Violence:   . Fear of Current or Ex-Partner:   . Emotionally Abused:   Marland Kitchen Physically Abused:   . Sexually Abused:     FAMILY HISTORY:  Family History  Problem Relation Age of Onset  . Diabetes Sister   . Hypertension Mother   . Lung cancer Mother   . Hypertension Father   . Heart attack Father   . Hypertension Sister   . Hypertension Sister   . Hypertension Sister   . Pulmonary fibrosis Sister   . Breast cancer Cousin   . Colon cancer Neg Hx     CURRENT MEDICATIONS:  Current Outpatient Medications  Medication Sig Dispense Refill  . apixaban (ELIQUIS) 5 MG TABS tablet Take 5 mg by mouth 2 (two) times daily.     Marland Kitchen AQUALANCE LANCETS 30G MISC USE TO check blood glucose twice daily  99  . atorvastatin (LIPITOR) 20 MG tablet Take 1 tablet (20 mg total) by mouth daily. 90 tablet 3  . diltiazem (DILACOR XR) 240 MG 24 hr capsule Take 240 mg by mouth daily.    Marland Kitchen dofetilide (TIKOSYN) 250 MCG capsule  Take 1 capsule (250 mcg total) by mouth 2 (two) times daily.    Marland Kitchen doxycycline (VIBRA-TABS) 100 MG tablet Take 100 mg by mouth 2 (two) times daily.    . fluticasone (FLONASE) 50 MCG/ACT nasal spray Place 2 sprays into both nostrils daily. 16 g 6  . Fluticasone-Umeclidin-Vilant (TRELEGY ELLIPTA) 100-62.5-25 MCG/INH AEPB Inhale 1  puff into the lungs daily.    . furosemide (LASIX) 40 MG tablet TAKE 1 TABLET BY MOUTH EVERY DAY 90 tablet 0  . gabapentin (NEURONTIN) 300 MG capsule Take 600 mg by mouth 2 (two) times daily.     Marland Kitchen HM SENNA 8.6 MG tablet Take 2 tablets by mouth at bedtime.    Marland Kitchen HYDROcodone-acetaminophen (NORCO/VICODIN) 5-325 MG tablet Take 1 tablet by mouth at bedtime as needed. 30 tablet 0  . hydrOXYzine (VISTARIL) 25 MG capsule Take 25 mg by mouth every 8 (eight) hours.    . insulin lispro (HUMALOG) 100 UNIT/ML injection Inject 2-20 Units into the skin 3 (three) times daily with meals. Sliding scale     . Insulin Syringe-Needle U-100 28G X 1/2" 1 ML MISC 1 each by Does not apply route 3 (three) times daily. 100 each 3  . Lancet Devices (ADJUSTABLE LANCING DEVICE) MISC TO check blood glucose daily    . LANTUS SOLOSTAR 100 UNIT/ML Solostar Pen INJECT 20 UNITS UNDER THE SKIN EVERY MORNING AND INJECT 20 UNITS UNDER THE SKIN EVERY EVENING (Patient taking differently: Inject 20 Units into the skin 2 (two) times daily. ) 45 mL 0  . levalbuterol (XOPENEX) 0.63 MG/3ML nebulizer solution Inhale 3 mLs (0.63 mg total) into the lungs 4 (four) times daily. 3 mL 0  . Lidocaine (SALONPAS PAIN RELIEVING EX) Apply 1 patch topically daily.     . metFORMIN (GLUCOPHAGE) 500 MG tablet Take 500 mg by mouth in the morning and at bedtime.    . metoprolol succinate (TOPROL-XL) 100 MG 24 hr tablet Take 100 mg by mouth daily.    . Multiple Vitamin (MULTIVITAMIN WITH MINERALS) TABS tablet Take 1 tablet by mouth daily.    . potassium chloride (KLOR-CON) 10 MEQ tablet Take 10 mEq by mouth 2 (two) times daily.    .  predniSONE (DELTASONE) 20 MG tablet Take 60 mg by mouth daily.    . predniSONE (STERAPRED UNI-PAK 48 TAB) 5 MG (48) TBPK tablet TAKE DOSE PACK AS DIRECTED PER PACKAGE INSTRUCTIONS    . pregabalin (LYRICA) 150 MG capsule Take 150 mg by mouth in the morning and at bedtime.    . senna-docusate (SENOKOT-S) 8.6-50 MG tablet Take 2 tablets by mouth at bedtime. 60 tablet 0  . spironolactone (ALDACTONE) 25 MG tablet Take 12.5 mg by mouth 2 (two) times daily.     . tamsulosin (FLOMAX) 0.4 MG CAPS capsule Take 1 capsule (0.4 mg total) by mouth 2 (two) times daily after a meal. 180 capsule 1  . tiotropium (SPIRIVA HANDIHALER) 18 MCG inhalation capsule Place 1 capsule (18 mcg total) into inhaler and inhale daily. 30 capsule 12  . traZODone (DESYREL) 50 MG tablet Take 50 mg by mouth at bedtime.  5  . acetaminophen (TYLENOL) 500 MG tablet Take 1,000 mg by mouth every 6 (six) hours as needed for moderate pain or headache. (Patient not taking: Reported on 08/05/2019)    . Artificial Saliva (BIOTENE DRY MOUTH MOISTURIZING) SOLN Apply 1 Dose topically as needed (dry mouth).  (Patient not taking: Reported on 08/05/2019)    . benzonatate (TESSALON) 200 MG capsule Take 1 capsule (200 mg total) by mouth 3 (three) times daily as needed for cough. (Patient not taking: Reported on 08/05/2019) 30 capsule 0  . Carboxymethylcellul-Glycerin (LUBRICATING EYE DROPS OP) Place 1 drop into the left eye daily as needed (dry eyes). (Patient not taking: Reported on 08/05/2019)    . guaifenesin (ROBITUSSIN) 100 MG/5ML syrup Take 200  mg by mouth at bedtime as needed for cough. (Patient not taking: Reported on 08/05/2019)    . ipratropium-albuterol (DUONEB) 0.5-2.5 (3) MG/3ML SOLN Take 3 mLs by nebulization every 4 (four) hours as needed (shortness of breath).  (Patient not taking: Reported on 08/05/2019)    . lidocaine-prilocaine (EMLA) cream Apply a small amount to port a cath site and cover with plastic wrap 1 hour prior to chemotherapy  appointments (Patient not taking: Reported on 08/05/2019) 30 g 3  . PROAIR HFA 108 (90 Base) MCG/ACT inhaler INHALE TWO PUFFS BY MOUTH EVERY 4 HOURS AS NEEDED FOR WHEEZING (Patient not taking: Reported on 08/05/2019) 17 g 0  . prochlorperazine (COMPAZINE) 10 MG tablet Take 1 tablet (10 mg total) by mouth every 6 (six) hours as needed (Nausea or vomiting). (Patient not taking: Reported on 08/05/2019) 60 tablet 1   No current facility-administered medications for this visit.    ALLERGIES:  No Known Allergies  PHYSICAL EXAM:  Performance status (ECOG): 1 - Symptomatic but completely ambulatory  Vitals:   08/05/19 0823  BP: 134/71  Pulse: 86  Resp: 18  Temp: (!) 96.9 F (36.1 C)  SpO2: 98%   Wt Readings from Last 3 Encounters:  08/05/19 243 lb 13.3 oz (110.6 kg)  07/23/19 241 lb 3.2 oz (109.4 kg)  07/15/19 248 lb 11.2 oz (112.8 kg)   Physical Exam Vitals reviewed.  Constitutional:      Appearance: Normal appearance. He is obese.  Cardiovascular:     Rate and Rhythm: Normal rate and regular rhythm.     Pulses: Normal pulses.     Heart sounds: Normal heart sounds.  Pulmonary:     Effort: Pulmonary effort is normal.     Breath sounds: Normal breath sounds.  Musculoskeletal:     Right lower leg: Edema (1+) present.     Left lower leg: Edema (1+) present.  Neurological:     General: No focal deficit present.     Mental Status: He is alert and oriented to person, place, and time.  Psychiatric:        Mood and Affect: Mood normal.        Behavior: Behavior normal.     LABORATORY DATA:  I have reviewed the labs as listed.  CBC Latest Ref Rng & Units 08/05/2019 07/15/2019 07/08/2019  WBC 4.0 - 10.5 K/uL 8.0 8.4 8.1  Hemoglobin 13.0 - 17.0 g/dL 10.6(L) 10.5(L) 9.5(L)  Hematocrit 39 - 52 % 34.2(L) 35.0(L) 31.8(L)  Platelets 150 - 400 K/uL 146(L) 207 235   CMP Latest Ref Rng & Units 08/05/2019 07/15/2019 07/08/2019  Glucose 70 - 99 mg/dL 393(H) 321(H) 321(H)  BUN 8 - 23 mg/dL 20 18  20   Creatinine 0.61 - 1.24 mg/dL 1.17 1.08 1.11  Sodium 135 - 145 mmol/L 134(L) 134(L) 137  Potassium 3.5 - 5.1 mmol/L 4.3 3.9 4.1  Chloride 98 - 111 mmol/L 93(L) 92(L) 94(L)  CO2 22 - 32 mmol/L 27 30 30   Calcium 8.9 - 10.3 mg/dL 8.9 9.0 9.4  Total Protein 6.5 - 8.1 g/dL 6.2(L) 6.4(L) 6.7  Total Bilirubin 0.3 - 1.2 mg/dL 0.5 0.3 0.4  Alkaline Phos 38 - 126 U/L 69 56 67  AST 15 - 41 U/L 25 22 19   ALT 0 - 44 U/L 40 29 29    DIAGNOSTIC IMAGING:  I have independently reviewed the scans and discussed with the patient. CT Chest W Contrast  Result Date: 07/07/2019 CLINICAL DATA:  Recurrence small cell lung cancer,  initially diagnosed on 04/03/2016 history of radiotherapy and systemic therapy. Currently on systemic therapy. EXAM: CT CHEST, ABDOMEN, AND PELVIS WITH CONTRAST TECHNIQUE: Multidetector CT imaging of the chest, abdomen and pelvis was performed following the standard protocol during bolus administration of intravenous contrast. CONTRAST:  156mL OMNIPAQUE IOHEXOL 300 MG/ML  SOLN COMPARISON:  None. April 17, 2019 FINDINGS: CT CHEST FINDINGS Cardiovascular: Calcified and noncalcified atheromatous plaque in the thoracic aorta without aneurysm. Three-vessel coronary artery calcification. Normal heart size. No pericardial effusion. Central pulmonary vasculature on venous phase assessment unremarkable. Mediastinum/Nodes: LEFT-sided Port-A-Cath terminates in the mid superior vena cava. No thoracic inlet adenopathy. No axillary adenopathy. Lungs/Pleura: Further consolidative change in the RIGHT upper lobe/volume loss along the mediastinal border a emanating from the RIGHT hilum. Nodular area in the RIGHT upper lobe extending towards areas of post treatment change has diminished since the previous exam currently measuring 2.0 x 1.4 cm (image 26, series 3) previously 2.8 x 1.8 cm. Nodule along the pleural surface in the peripheral RIGHT chest on image 38 of series 3 has nearly completely resolved measuring  approximately 1.2 x 0.4 cm, previously 2.3 x 1.3 cm. Similar decrease in size of lesion on a along the pleural surface in the RIGHT lower lobe on image 72 of series 3 now measuring approximately 1.2 cm in long axis, previously nearly 2 cm. Patchy opacities at the RIGHT lung base have also diminished. Interval development however of an area of consolidative change in the LEFT upper lobe (image 15, series 3) 4.3 x 1.8 cm with some surrounding ground-glass. The lingular volume loss is a stable finding. Nodular area along the pleural surface at the LEFT lung base measuring 1.9 by 1.4 cm (image 91, series 3) more confluent than on the prior exam. Bronchial wall thickening to the LEFT lower lobe more pronounced than on the previous study. Background pulmonary emphysema as before. Musculoskeletal: No chest wall mass. CT ABDOMEN PELVIS FINDINGS Hepatobiliary: Liver with mildly lobulated contours, no focal hepatic lesion. No pericholecystic stranding. Pancreas: Pancreas is unremarkable. No peripancreatic stranding or ductal dilation. Spleen: Normal spleen. Adrenals/Urinary Tract: Adrenal glands are normal. Symmetric renal enhancement. Normal appearance of urinary bladder. No focal, suspicious renal lesion. No hydronephrosis. Stomach/Bowel: Gastrointestinal tract with signs of colonic diverticulosis. No evidence of diverticulitis. Vascular/Lymphatic: Calcified atheromatous plaque of the abdominal aorta. No aneurysm. No adenopathy. Reproductive: Prostate unremarkable by CT. Other: No ascites.  No abdominal wall hernia. Musculoskeletal: Signs of healed LEFT-sided rib fractures. Chronic findings. Spinal degenerative changes. No acute musculoskeletal process. IMPRESSION: 1. Decreasing size nodules in the RIGHT upper lobe and lower lobe along the pleura. 2. Interval development of consolidative changes with surrounding ground-glass in the LEFT upper lobe and further added density in the process in the LEFT lower lobe. Findings may  be sequela of infectious or inflammatory change, with scarring in the lower lobe and with more acute process in the LEFT upper lobe. Suggest attention on short interval follow-up and correlation with respiratory symptoms. 3. Bronchial wall thickening in the lower lobe in particular on the LEFT with material in the bronchi. Correlate with any risk factors for aspiration. Findings could be related to concomitant bronchial inflammation given the nodular pattern seen at the LEFT lung base. (This pattern is similar to the prior exam) 4. Signs of colonic diverticulosis without evidence of diverticulitis. 5. Aortic atherosclerosis. These results will be called to the ordering clinician or representative by the Radiologist Assistant, and communication documented in the PACS or [employer]. Aortic Atherosclerosis (  ICD10-I70.0) and Emphysema (ICD10-J43.9). Electronically Signed   By: Zetta Bills M.D.   On: 07/07/2019 13:07   MR Brain W Wo Contrast  Result Date: 08/01/2019 CLINICAL DATA:  Small cell lung cancer with prophylactic whole-brain radiotherapy. EXAM: MRI HEAD WITHOUT AND WITH CONTRAST TECHNIQUE: Multiplanar, multiecho pulse sequences of the brain and surrounding structures were obtained without and with intravenous contrast. CONTRAST:  18mL GADAVIST GADOBUTROL 1 MMOL/ML IV SOLN COMPARISON:  05/12/2019 FINDINGS: Brain: 7 mm ring-enhancing mass near the cortex of the left occipital temporal junction with minimal if any vasogenic edema. No new lesion. Generalized brain atrophy and extensive white matter FLAIR hyperintensity. There has been prior whole-brain radiotherapy. No acute or subacute infarct, hemorrhage, hydrocephalus, or extra-axial collection. Vascular: Preserved flow voids and vascular enhancements Skull and upper cervical spine: Normal marrow signal Sinuses/Orbits: Bilateral cataract resection. Minor sinus mucosal thickening. IMPRESSION: Size stable 7 mm solitary metastasis at the left occipital  temporal junction. Electronically Signed   By: Monte Fantasia M.D.   On: 08/01/2019 09:55   CT Abdomen Pelvis W Contrast  Result Date: 07/07/2019 CLINICAL DATA:  Recurrence small cell lung cancer, initially diagnosed on 04/03/2016 history of radiotherapy and systemic therapy. Currently on systemic therapy. EXAM: CT CHEST, ABDOMEN, AND PELVIS WITH CONTRAST TECHNIQUE: Multidetector CT imaging of the chest, abdomen and pelvis was performed following the standard protocol during bolus administration of intravenous contrast. CONTRAST:  161mL OMNIPAQUE IOHEXOL 300 MG/ML  SOLN COMPARISON:  None. April 17, 2019 FINDINGS: CT CHEST FINDINGS Cardiovascular: Calcified and noncalcified atheromatous plaque in the thoracic aorta without aneurysm. Three-vessel coronary artery calcification. Normal heart size. No pericardial effusion. Central pulmonary vasculature on venous phase assessment unremarkable. Mediastinum/Nodes: LEFT-sided Port-A-Cath terminates in the mid superior vena cava. No thoracic inlet adenopathy. No axillary adenopathy. Lungs/Pleura: Further consolidative change in the RIGHT upper lobe/volume loss along the mediastinal border a emanating from the RIGHT hilum. Nodular area in the RIGHT upper lobe extending towards areas of post treatment change has diminished since the previous exam currently measuring 2.0 x 1.4 cm (image 26, series 3) previously 2.8 x 1.8 cm. Nodule along the pleural surface in the peripheral RIGHT chest on image 38 of series 3 has nearly completely resolved measuring approximately 1.2 x 0.4 cm, previously 2.3 x 1.3 cm. Similar decrease in size of lesion on a along the pleural surface in the RIGHT lower lobe on image 72 of series 3 now measuring approximately 1.2 cm in long axis, previously nearly 2 cm. Patchy opacities at the RIGHT lung base have also diminished. Interval development however of an area of consolidative change in the LEFT upper lobe (image 15, series 3) 4.3 x 1.8 cm with  some surrounding ground-glass. The lingular volume loss is a stable finding. Nodular area along the pleural surface at the LEFT lung base measuring 1.9 by 1.4 cm (image 91, series 3) more confluent than on the prior exam. Bronchial wall thickening to the LEFT lower lobe more pronounced than on the previous study. Background pulmonary emphysema as before. Musculoskeletal: No chest wall mass. CT ABDOMEN PELVIS FINDINGS Hepatobiliary: Liver with mildly lobulated contours, no focal hepatic lesion. No pericholecystic stranding. Pancreas: Pancreas is unremarkable. No peripancreatic stranding or ductal dilation. Spleen: Normal spleen. Adrenals/Urinary Tract: Adrenal glands are normal. Symmetric renal enhancement. Normal appearance of urinary bladder. No focal, suspicious renal lesion. No hydronephrosis. Stomach/Bowel: Gastrointestinal tract with signs of colonic diverticulosis. No evidence of diverticulitis. Vascular/Lymphatic: Calcified atheromatous plaque of the abdominal aorta. No aneurysm. No adenopathy. Reproductive:  Prostate unremarkable by CT. Other: No ascites.  No abdominal wall hernia. Musculoskeletal: Signs of healed LEFT-sided rib fractures. Chronic findings. Spinal degenerative changes. No acute musculoskeletal process. IMPRESSION: 1. Decreasing size nodules in the RIGHT upper lobe and lower lobe along the pleura. 2. Interval development of consolidative changes with surrounding ground-glass in the LEFT upper lobe and further added density in the process in the LEFT lower lobe. Findings may be sequela of infectious or inflammatory change, with scarring in the lower lobe and with more acute process in the LEFT upper lobe. Suggest attention on short interval follow-up and correlation with respiratory symptoms. 3. Bronchial wall thickening in the lower lobe in particular on the LEFT with material in the bronchi. Correlate with any risk factors for aspiration. Findings could be related to concomitant bronchial  inflammation given the nodular pattern seen at the LEFT lung base. (This pattern is similar to the prior exam) 4. Signs of colonic diverticulosis without evidence of diverticulitis. 5. Aortic atherosclerosis. These results will be called to the ordering clinician or representative by the Radiologist Assistant, and communication documented in the PACS or [employer]. Aortic Atherosclerosis (ICD10-I70.0) and Emphysema (ICD10-J43.9). Electronically Signed   By: Zetta Bills M.D.   On: 07/07/2019 13:07     ASSESSMENT:  1. Recurrent small cell lung cancer: -Treated for limited stage small cell lung cancer with chemoradiation with cisplatin and etoposide therapy on 06/21/2016 followed by PCI. -CT scan on 04/17/2019 showed worsening right lung nodules. Left lower lobe infectious cavitary lesion was decreased in size. New right middle lobe lung nodule present. -3 cycles of carboplatin, VP-16 and atezolizumab from 05/06/2019 through 06/17/2019. -CT CAP on 07/07/2019 showed improvement in the right lung cancer.  Interval development of consolidative change in the left upper lobe consistent with infection/inflammation.  2. Brain metastasis: -MRI of the brain on 05/13/2019 showed small 7 mm ring enhancing lesion at the junction of the left posterior temporal and anterior occipital lobes compatible with solitary metastasis. Typical appearance of lacunar infarct is also considered. -MRI of the brain on 07/31/2019 shows stable 7 mm solitary metastasis in the left occipital temporal junction.   PLAN:  1. Recurrent small cell lung cancer: -He has tolerated last cycle very well.  Did not have to go to ER or hospital. -I reviewed his labs which are grossly within normal limits. -I have discussed that I will add 2 more cycles of chemotherapy given good response and residual disease.  He is agreeable. -He will proceed with cycle 5 of chemoimmunotherapy.  I will see him back in 3 weeks for follow-up.  2.  CKD: -Creatinine continues to stay low at 1.17.  3. Diabetes: -Continue Lantus 20 units twice daily and NovoLog sliding scale.  4. Atrial fibrillation: -Continue Eliquis 5 mg twice daily.  5. Low back pain: -Continue hydrocodone 5/325 at bedtime as needed.  I have sent a refill.  6.  Brain metastasis: -I discussed results of MRI of the brain.  We plan to do SBRT upon completion of chemotherapy.  He is asymptomatic.   Orders placed this encounter:  No orders of the defined types were placed in this encounter.    Derek Jack, MD [place] [phone]   I, Milinda Antis, am acting as a scribe for Dr. Sanda Linger.  I, Derek Jack MD, have reviewed the above documentation for accuracy and completeness, and I agree with the above.

## 2019-08-05 NOTE — Patient Instructions (Signed)
[place] at [facility] Discharge Instructions  You were seen today by Dr. Delton Coombes. He went over your recent results. You received your treatment today. You will be prescribed some hydrocodone for your back pain. Dr. Delton Coombes will see you back in 3 weeks for labs and follow up.   Thank you for choosing [place] at [facility] to provide your oncology and hematology care.  To afford each patient quality time with our provider, please arrive at least 15 minutes before your scheduled appointment time.   If you have a lab appointment with the [facility] please come in thru the Main Entrance and check in at the main information desk  You need to re-schedule your appointment should you arrive 10 or more minutes late.  We strive to give you quality time with our providers, and arriving late affects you and other patients whose appointments are after yours.  Also, if you no show three or more times for appointments you may be dismissed from the clinic at the providers discretion.     Again, thank you for choosing [facility].  Our hope is that these requests will decrease the amount of time that you wait before being seen by our physicians.       _____________________________________________________________  Should you have questions after your visit to [facility], please contact our office at [phone] between the hours of 8:00 a.m. and 4:30 p.m.  Voicemails left after 4:00 p.m. will not be returned until the following business day.  For prescription refill requests, have your pharmacy contact our office and allow 72 hours.    Cancer Center Support Programs:   > Cancer Support Group  2nd Tuesday of the month 1pm-2pm, Journey Room

## 2019-08-05 NOTE — Progress Notes (Signed)
Patient tolerated chemotherapy with no complaints voiced.  Side effects with management reviewed with understanding verbalized.  Port site clean and dry with no bruising or swelling noted at site.  Good blood return noted before and after administration of chemotherapy.  Band aid applied.  Patient left in satisfactory condition with VSS and no s/s of distress noted.   

## 2019-08-06 ENCOUNTER — Inpatient Hospital Stay (HOSPITAL_COMMUNITY): Payer: Medicare Other

## 2019-08-06 ENCOUNTER — Encounter (HOSPITAL_COMMUNITY): Payer: Self-pay

## 2019-08-06 ENCOUNTER — Other Ambulatory Visit: Payer: Self-pay

## 2019-08-06 VITALS — BP 111/60 | HR 75 | Temp 97.8°F | Resp 18

## 2019-08-06 DIAGNOSIS — Z5112 Encounter for antineoplastic immunotherapy: Secondary | ICD-10-CM | POA: Diagnosis not present

## 2019-08-06 DIAGNOSIS — C3491 Malignant neoplasm of unspecified part of right bronchus or lung: Secondary | ICD-10-CM

## 2019-08-06 MED ORDER — SODIUM CHLORIDE 0.9 % IV SOLN: Freq: Once | INTRAVENOUS | Status: AC

## 2019-08-06 MED ORDER — SODIUM CHLORIDE 0.9 % IV SOLN
80.0000 mg/m2 | Freq: Once | INTRAVENOUS | Status: AC
  Administered 2019-08-06: 190 mg via INTRAVENOUS
  Filled 2019-08-06: qty 9.5

## 2019-08-06 MED ORDER — SODIUM CHLORIDE 0.9% FLUSH
10.0000 mL | INTRAVENOUS | Status: DC | PRN
  Administered 2019-08-06: 10 mL

## 2019-08-06 MED ORDER — HEPARIN SOD (PORK) LOCK FLUSH 100 UNIT/ML IV SOLN
500.0000 [IU] | Freq: Once | INTRAVENOUS | Status: AC | PRN
  Administered 2019-08-06: 500 [IU]

## 2019-08-06 MED ORDER — SODIUM CHLORIDE 0.9 % IV SOLN
10.0000 mg | Freq: Once | INTRAVENOUS | Status: AC
  Administered 2019-08-06: 10 mg via INTRAVENOUS
  Filled 2019-08-06: qty 10

## 2019-08-06 NOTE — Patient Instructions (Signed)
[facility] Discharge Instructions for Patients Receiving Chemotherapy  Today you received the following chemotherapy agents   To help prevent nausea and vomiting after your treatment, we encourage you to take your nausea medication   If you develop nausea and vomiting that is not controlled by your nausea medication, call the clinic.   BELOW ARE SYMPTOMS THAT SHOULD BE REPORTED IMMEDIATELY:  *FEVER GREATER THAN 100.5 F  *CHILLS WITH OR WITHOUT FEVER  NAUSEA AND VOMITING THAT IS NOT CONTROLLED WITH YOUR NAUSEA MEDICATION  *UNUSUAL SHORTNESS OF BREATH  *UNUSUAL BRUISING OR BLEEDING  TENDERNESS IN MOUTH AND THROAT WITH OR WITHOUT PRESENCE OF ULCERS  *URINARY PROBLEMS  *BOWEL PROBLEMS  UNUSUAL RASH Items with * indicate a potential emergency and should be followed up as soon as possible.  Feel free to call the clinic should you have any questions or concerns. The clinic phone number is [phone].  Please show the CHEMO ALERT CARD at check-in to the Emergency Department and triage nurse.   

## 2019-08-06 NOTE — Progress Notes (Signed)
Patient presents today for treatment. Vital signs stable. Patient has no complaints of pain today. Patient denies any significant changes since his last treatment.  Treatment given today per MD orders. Tolerated infusion without adverse affects. Vital signs stable. No complaints at this time. Discharged from clinic via wheel chair. F/U with [facility] as scheduled.

## 2019-08-07 ENCOUNTER — Inpatient Hospital Stay (HOSPITAL_COMMUNITY): Payer: Medicare Other

## 2019-08-07 VITALS — BP 121/50 | HR 77 | Temp 96.8°F | Resp 18

## 2019-08-07 DIAGNOSIS — C3491 Malignant neoplasm of unspecified part of right bronchus or lung: Secondary | ICD-10-CM

## 2019-08-07 DIAGNOSIS — Z5112 Encounter for antineoplastic immunotherapy: Secondary | ICD-10-CM | POA: Diagnosis not present

## 2019-08-07 MED ORDER — SODIUM CHLORIDE 0.9 % IV SOLN
80.0000 mg/m2 | Freq: Once | INTRAVENOUS | Status: AC
  Administered 2019-08-07: 190 mg via INTRAVENOUS
  Filled 2019-08-07: qty 9.5

## 2019-08-07 MED ORDER — SODIUM CHLORIDE 0.9 % IV SOLN: Freq: Once | INTRAVENOUS | Status: AC

## 2019-08-07 MED ORDER — SODIUM CHLORIDE 0.9% FLUSH
10.0000 mL | INTRAVENOUS | Status: DC | PRN
  Administered 2019-08-07: 10 mL

## 2019-08-07 MED ORDER — HEPARIN SOD (PORK) LOCK FLUSH 100 UNIT/ML IV SOLN
500.0000 [IU] | Freq: Once | INTRAVENOUS | Status: AC | PRN
  Administered 2019-08-07: 500 [IU]

## 2019-08-07 MED ORDER — SODIUM CHLORIDE 0.9 % IV SOLN
10.0000 mg | Freq: Once | INTRAVENOUS | Status: AC
  Administered 2019-08-07: 10 mg via INTRAVENOUS
  Filled 2019-08-07: qty 10

## 2019-08-07 NOTE — Progress Notes (Signed)
Tolerated infusion w/o adverse reaction.  Alert, in no distress.  VSS.  Discharged via wheelchair in c/o NT.

## 2019-08-10 ENCOUNTER — Other Ambulatory Visit: Payer: Self-pay

## 2019-08-10 ENCOUNTER — Encounter (HOSPITAL_COMMUNITY): Payer: Self-pay

## 2019-08-10 ENCOUNTER — Inpatient Hospital Stay (HOSPITAL_COMMUNITY): Payer: Medicare Other

## 2019-08-10 VITALS — BP 112/60 | HR 89 | Temp 98.3°F | Resp 18

## 2019-08-10 DIAGNOSIS — Z5112 Encounter for antineoplastic immunotherapy: Secondary | ICD-10-CM | POA: Diagnosis not present

## 2019-08-10 DIAGNOSIS — C3491 Malignant neoplasm of unspecified part of right bronchus or lung: Secondary | ICD-10-CM

## 2019-08-10 MED ORDER — PEGFILGRASTIM-CBQV 6 MG/0.6ML ~~LOC~~ SOSY
6.0000 mg | PREFILLED_SYRINGE | Freq: Once | SUBCUTANEOUS | Status: AC
  Administered 2019-08-10: 6 mg via SUBCUTANEOUS
  Filled 2019-08-10: qty 0.6

## 2019-08-10 NOTE — Progress Notes (Signed)
Chase Herrera presents today for injection per the provider's orders.  Udenyca administration without incident; injection site WNL; see MAR for injection details.  Patient tolerated procedure well and without incident.  No questions or complaints noted at this time. Pt d/c clinic via wheelchair.

## 2019-08-26 ENCOUNTER — Inpatient Hospital Stay (HOSPITAL_COMMUNITY): Payer: Medicare Other | Attending: Hematology

## 2019-08-26 ENCOUNTER — Inpatient Hospital Stay (HOSPITAL_BASED_OUTPATIENT_CLINIC_OR_DEPARTMENT_OTHER): Payer: Medicare Other | Admitting: Hematology

## 2019-08-26 ENCOUNTER — Inpatient Hospital Stay (HOSPITAL_COMMUNITY): Payer: Medicare Other

## 2019-08-26 ENCOUNTER — Other Ambulatory Visit: Payer: Self-pay

## 2019-08-26 VITALS — BP 116/61 | HR 77 | Temp 97.1°F | Resp 18

## 2019-08-26 DIAGNOSIS — Z5111 Encounter for antineoplastic chemotherapy: Secondary | ICD-10-CM | POA: Insufficient documentation

## 2019-08-26 DIAGNOSIS — R52 Pain, unspecified: Secondary | ICD-10-CM | POA: Diagnosis not present

## 2019-08-26 DIAGNOSIS — Z79899 Other long term (current) drug therapy: Secondary | ICD-10-CM | POA: Diagnosis not present

## 2019-08-26 DIAGNOSIS — I4891 Unspecified atrial fibrillation: Secondary | ICD-10-CM | POA: Insufficient documentation

## 2019-08-26 DIAGNOSIS — C3411 Malignant neoplasm of upper lobe, right bronchus or lung: Secondary | ICD-10-CM | POA: Diagnosis present

## 2019-08-26 DIAGNOSIS — N189 Chronic kidney disease, unspecified: Secondary | ICD-10-CM | POA: Diagnosis not present

## 2019-08-26 DIAGNOSIS — E1122 Type 2 diabetes mellitus with diabetic chronic kidney disease: Secondary | ICD-10-CM | POA: Insufficient documentation

## 2019-08-26 DIAGNOSIS — R5383 Other fatigue: Secondary | ICD-10-CM | POA: Insufficient documentation

## 2019-08-26 DIAGNOSIS — R531 Weakness: Secondary | ICD-10-CM | POA: Diagnosis not present

## 2019-08-26 DIAGNOSIS — M7989 Other specified soft tissue disorders: Secondary | ICD-10-CM | POA: Diagnosis not present

## 2019-08-26 DIAGNOSIS — G319 Degenerative disease of nervous system, unspecified: Secondary | ICD-10-CM | POA: Diagnosis not present

## 2019-08-26 DIAGNOSIS — J449 Chronic obstructive pulmonary disease, unspecified: Secondary | ICD-10-CM | POA: Diagnosis not present

## 2019-08-26 DIAGNOSIS — M545 Low back pain: Secondary | ICD-10-CM | POA: Insufficient documentation

## 2019-08-26 DIAGNOSIS — Z87442 Personal history of urinary calculi: Secondary | ICD-10-CM | POA: Diagnosis not present

## 2019-08-26 DIAGNOSIS — C3491 Malignant neoplasm of unspecified part of right bronchus or lung: Secondary | ICD-10-CM

## 2019-08-26 DIAGNOSIS — I429 Cardiomyopathy, unspecified: Secondary | ICD-10-CM | POA: Diagnosis not present

## 2019-08-26 DIAGNOSIS — Z87891 Personal history of nicotine dependence: Secondary | ICD-10-CM | POA: Diagnosis not present

## 2019-08-26 DIAGNOSIS — Z836 Family history of other diseases of the respiratory system: Secondary | ICD-10-CM | POA: Insufficient documentation

## 2019-08-26 DIAGNOSIS — Z803 Family history of malignant neoplasm of breast: Secondary | ICD-10-CM | POA: Insufficient documentation

## 2019-08-26 DIAGNOSIS — Z5189 Encounter for other specified aftercare: Secondary | ICD-10-CM | POA: Insufficient documentation

## 2019-08-26 DIAGNOSIS — Z9049 Acquired absence of other specified parts of digestive tract: Secondary | ICD-10-CM | POA: Insufficient documentation

## 2019-08-26 DIAGNOSIS — C7931 Secondary malignant neoplasm of brain: Secondary | ICD-10-CM | POA: Diagnosis not present

## 2019-08-26 DIAGNOSIS — I2510 Atherosclerotic heart disease of native coronary artery without angina pectoris: Secondary | ICD-10-CM | POA: Insufficient documentation

## 2019-08-26 DIAGNOSIS — Z801 Family history of malignant neoplasm of trachea, bronchus and lung: Secondary | ICD-10-CM | POA: Insufficient documentation

## 2019-08-26 DIAGNOSIS — R609 Edema, unspecified: Secondary | ICD-10-CM | POA: Insufficient documentation

## 2019-08-26 DIAGNOSIS — Z5112 Encounter for antineoplastic immunotherapy: Secondary | ICD-10-CM | POA: Diagnosis present

## 2019-08-26 DIAGNOSIS — Z7901 Long term (current) use of anticoagulants: Secondary | ICD-10-CM | POA: Diagnosis not present

## 2019-08-26 DIAGNOSIS — Z833 Family history of diabetes mellitus: Secondary | ICD-10-CM | POA: Diagnosis not present

## 2019-08-26 DIAGNOSIS — Z8249 Family history of ischemic heart disease and other diseases of the circulatory system: Secondary | ICD-10-CM | POA: Insufficient documentation

## 2019-08-26 LAB — COMPREHENSIVE METABOLIC PANEL
ALT: 41 U/L (ref 0–44)
AST: 29 U/L (ref 15–41)
Albumin: 3.3 g/dL — ABNORMAL LOW (ref 3.5–5.0)
Alkaline Phosphatase: 80 U/L (ref 38–126)
Anion gap: 11 (ref 5–15)
BUN: 11 mg/dL (ref 8–23)
CO2: 28 mmol/L (ref 22–32)
Calcium: 8.4 mg/dL — ABNORMAL LOW (ref 8.9–10.3)
Chloride: 99 mmol/L (ref 98–111)
Creatinine, Ser: 1.08 mg/dL (ref 0.61–1.24)
GFR calc Af Amer: >60 mL/min (ref >60)
GFR calc non Af Amer: >60 mL/min (ref >60)
Glucose, Bld: 251 mg/dL — ABNORMAL HIGH (ref 70–99)
Potassium: 4.3 mmol/L (ref 3.5–5.1)
Sodium: 138 mmol/L (ref 135–145)
Total Bilirubin: 0.4 mg/dL (ref 0.3–1.2)
Total Protein: 6.1 g/dL — ABNORMAL LOW (ref 6.5–8.1)

## 2019-08-26 LAB — CBC WITH DIFFERENTIAL/PLATELET
Abs Immature Granulocytes: 0.11 10*3/uL — ABNORMAL HIGH (ref 0.00–0.07)
Basophils Absolute: 0.1 10*3/uL (ref 0.0–0.1)
Basophils Relative: 1 %
Eosinophils Absolute: 0.1 10*3/uL (ref 0.0–0.5)
Eosinophils Relative: 1 %
HCT: 32.1 % — ABNORMAL LOW (ref 39.0–52.0)
Hemoglobin: 9.7 g/dL — ABNORMAL LOW (ref 13.0–17.0)
Immature Granulocytes: 1 %
Lymphocytes Relative: 6 %
Lymphs Abs: 0.5 10*3/uL — ABNORMAL LOW (ref 0.7–4.0)
MCH: 33.2 pg (ref 26.0–34.0)
MCHC: 30.2 g/dL (ref 30.0–36.0)
MCV: 109.9 fL — ABNORMAL HIGH (ref 80.0–100.0)
Monocytes Absolute: 1.1 10*3/uL — ABNORMAL HIGH (ref 0.1–1.0)
Monocytes Relative: 14 %
Neutro Abs: 6.1 10*3/uL (ref 1.7–7.7)
Neutrophils Relative %: 77 %
Platelets: 164 10*3/uL (ref 150–400)
RBC: 2.92 MIL/uL — ABNORMAL LOW (ref 4.22–5.81)
RDW: 17.7 % — ABNORMAL HIGH (ref 11.5–15.5)
WBC: 8 10*3/uL (ref 4.0–10.5)
nRBC: 0 % (ref 0.0–0.2)

## 2019-08-26 LAB — TSH: TSH: 3.213 u[IU]/mL (ref 0.350–4.500)

## 2019-08-26 MED ORDER — SODIUM CHLORIDE 0.9 % IV SOLN: Freq: Once | INTRAVENOUS | Status: AC

## 2019-08-26 MED ORDER — SODIUM CHLORIDE 0.9 % IV SOLN
150.0000 mg | Freq: Once | INTRAVENOUS | Status: AC
  Administered 2019-08-26: 150 mg via INTRAVENOUS
  Filled 2019-08-26: qty 150

## 2019-08-26 MED ORDER — HEPARIN SOD (PORK) LOCK FLUSH 100 UNIT/ML IV SOLN
500.0000 [IU] | Freq: Once | INTRAVENOUS | Status: AC | PRN
  Administered 2019-08-26: 500 [IU]

## 2019-08-26 MED ORDER — SODIUM CHLORIDE 0.9% FLUSH
10.0000 mL | INTRAVENOUS | Status: DC | PRN
  Administered 2019-08-26: 10 mL

## 2019-08-26 MED ORDER — SODIUM CHLORIDE 0.9 % IV SOLN
539.2000 mg | Freq: Once | INTRAVENOUS | Status: AC
  Administered 2019-08-26: 540 mg via INTRAVENOUS
  Filled 2019-08-26: qty 54

## 2019-08-26 MED ORDER — SODIUM CHLORIDE 0.9 % IV SOLN
80.0000 mg/m2 | Freq: Once | INTRAVENOUS | Status: AC
  Administered 2019-08-26: 190 mg via INTRAVENOUS
  Filled 2019-08-26: qty 9.5

## 2019-08-26 MED ORDER — PALONOSETRON HCL INJECTION 0.25 MG/5ML
0.2500 mg | Freq: Once | INTRAVENOUS | Status: AC
  Administered 2019-08-26: 0.25 mg via INTRAVENOUS
  Filled 2019-08-26: qty 5

## 2019-08-26 MED ORDER — SODIUM CHLORIDE 0.9 % IV SOLN
10.0000 mg | Freq: Once | INTRAVENOUS | Status: AC
  Administered 2019-08-26: 10 mg via INTRAVENOUS
  Filled 2019-08-26: qty 10

## 2019-08-26 MED ORDER — SODIUM CHLORIDE 0.9 % IV SOLN
1200.0000 mg | Freq: Once | INTRAVENOUS | Status: AC
  Administered 2019-08-26: 1200 mg via INTRAVENOUS
  Filled 2019-08-26: qty 20

## 2019-08-26 NOTE — Progress Notes (Signed)
Chase Herrera, [place] [address]   CLINIC:  Medical Oncology/Hematology  PCP:  [facility] [place] / [place] [address] [phone]   REASON FOR VISIT:  Follow-up for small cell lung cancer  PRIOR THERAPY: Carboplatin, VP-16 and atezolizumab x 3 cycles from 05/06/2019 to 06/17/2019  NGS Results: Not done  CURRENT THERAPY: Atezolizumab, carboplatin, & etoposide   BRIEF ONCOLOGIC HISTORY:  Oncology History  Small cell lung cancer ([place])  04/03/2016 Initial Diagnosis   Small cell lung cancer ([place])   04/09/2016 Imaging   MRI brain- Negative for metastatic disease to the brain. No acute abnormality.   04/09/2016 Procedure   Right IJ port catheter placement by IR   04/13/2016 PET scan   . The right apical lung mass has significantly enlarged and there is new right paratracheal adenopathy as well as a hypermetabolic new lymph node in the right inner infraclavicular fossa potentially with early impingement on adjacent neurovascular structures. 2. Other imaging findings of potential clinical significance: Chronic bilateral maxillary sinusitis. Left carotid atherosclerotic calcification. Aortoiliac atherosclerotic vascular disease. Coronary atherosclerosis. Paraseptal emphysema. Mild atelectasis in the lung bases. Scattered sigmoid colon diverticula. Probable right pars defect at L5.   05/09/2016 - 07/12/2016 Radiation Therapy   Radiotherapy to right Lung+MS 63 Gy/35 fractions at 1.8 GY/f using 6 x photons 3- D CRT technique Dates: 4/18-6/21/18       06/19/2016 Treatment Plan Change   Cisplatin dose reduced by 20% for cycle #4 due to severe thrombocytopenia following cycle #3 (13,000).   08/21/2016 PET scan   1. Marked reduction in size and metabolic activity of RIGHT upper lobe mass. 2. Resolution of mediastinal and RIGHT supraclavicular lymphadenopathy. 3. No evidence of disease progression.   05/06/2019  -  Chemotherapy   The patient had palonosetron (ALOXI) injection 0.25 mg, 0.25 mg, Intravenous,  Once, 5 of 6 cycles Administration: 0.25 mg (05/06/2019), 0.25 mg (05/27/2019), 0.25 mg (06/17/2019), 0.25 mg (07/15/2019), 0.25 mg (08/05/2019) pegfilgrastim-cbqv (UDENYCA) injection 6 mg, 6 mg, Subcutaneous, Once, 5 of 6 cycles Administration: 6 mg (05/11/2019), 6 mg (06/01/2019), 6 mg (06/23/2019), 6 mg (07/20/2019), 6 mg (08/10/2019) CARBOplatin (PARAPLATIN) 450 mg in sodium chloride 0.9 % 250 mL chemo infusion, 450 mg (100 % of original dose 446.4 mg), Intravenous,  Once, 5 of 6 cycles Dose modification:   (original dose 446.4 mg, Cycle 1, Reason: Provider Judgment),   (original dose 500 mg, Cycle 2, Reason: Provider Judgment), 500 mg (original dose 500 mg, Cycle 2, Reason: Provider Judgment), 500 mg (original dose 500 mg, Cycle 3),   (original dose 527.6 mg, Cycle 4, Reason: Provider Judgment) Administration: 450 mg (05/06/2019), 500 mg (05/27/2019), 500 mg (06/17/2019), 540 mg (07/15/2019), 510 mg (08/05/2019) etoposide (VEPESID) 190 mg in sodium chloride 0.9 % 500 mL chemo infusion, 80 mg/m2 = 190 mg (80 % of original dose 100 mg/m2), Intravenous,  Once, 5 of 6 cycles Dose modification: 80 mg/m2 (80 % of original dose 100 mg/m2, Cycle 1, Reason: Provider Judgment), 80 mg/m2 (original dose 100 mg/m2, Cycle 1, Reason: Provider Judgment), 80 mg/m2 (80 % of original dose 100 mg/m2, Cycle 2, Reason: Provider Judgment) Administration: 190 mg (05/06/2019), 190 mg (05/07/2019), 190 mg (05/08/2019), 190 mg (05/27/2019), 190 mg (05/28/2019), 190 mg (05/29/2019), 190 mg (06/17/2019), 190 mg (06/18/2019), 190 mg (06/19/2019), 190 mg (07/15/2019), 190 mg (07/16/2019), 190 mg (07/17/2019), 190 mg (08/05/2019), 190 mg (08/06/2019), 190 mg (08/07/2019) fosaprepitant (EMEND) 150 mg in sodium chloride  0.9 % 145 mL IVPB, 150 mg, Intravenous,  Once, 5 of 6 cycles Administration: 150 mg (05/06/2019), 150 mg (05/27/2019), 150 mg (06/17/2019), 150 mg (07/15/2019),  150 mg (08/05/2019) atezolizumab (TECENTRIQ) 1,200 mg in sodium chloride 0.9 % 250 mL chemo infusion, 1,200 mg, Intravenous, Once, 5 of 10 cycles Administration: 1,200 mg (05/06/2019), 1,200 mg (05/27/2019), 1,200 mg (06/17/2019), 1,200 mg (07/15/2019), 1,200 mg (08/05/2019)  for chemotherapy treatment.      CANCER STAGING: Cancer Staging Small cell lung cancer ([place]) Staging form: Lung, AJCC 8th Edition - Clinical: Stage IIIC (cT3, cN3, cM0) - Signed by Twana First, MD on 04/16/2016   INTERVAL HISTORY:  [name], a 69 y.o. male, returns for routine follow-up and consideration for next cycle of chemotherapy. [name] was last seen on 08/05/2019.  Due for cycle #6 of atezolizumab, carboplatin and etoposide today.   Overall, he tells me he has been feeling pretty well. Today he is requesting an electric wheelchair due to weak upper body strength leading to mobility issues. He takes the Norco as needed for his pain. He denies having any headaches. He takes spironolactone 12.5 mg BID for his leg swelling. He is not currently taking prednisone.  Overall, he feels ready for next cycle of chemo today.    REVIEW OF SYSTEMS:  Review of Systems  Constitutional: Positive for fatigue (moderate). Negative for appetite change.  Cardiovascular: Positive for leg swelling.  Neurological: Negative for headaches.  All other systems reviewed and are negative.   PAST MEDICAL/SURGICAL HISTORY:  Past Medical History:  Diagnosis Date  . Alcohol abuse    Quit 08/23/11  . Arthritis   . Atrial fibrillation ([place])   . Atrial flutter ([place])    CTI ablation by Dr [name] 10/2011  . Bacteremia due to Gram-positive bacteria 11/29/2016  . CHF (congestive heart failure) ([place])   . COPD (chronic obstructive pulmonary disease) ([place])   . Depression   . Essential hypertension   . History of cardiomyopathy    LVEF 25-30% 08/2011 with subsequent normalization  . History of kidney stones   . Obesity   . Prostate  enlargement   . Small cell lung cancer ([place])    Right upper lobe - follows with [facility]  . Type 2 diabetes mellitus ([place])    Past Surgical History:  Procedure Laterality Date  . APPENDECTOMY    . ATRIAL ABLATION SURGERY  11/06/2011   CTI ablation for atrial flutter by Dr [name]  . ATRIAL FLUTTER ABLATION N/A 11/06/2011   Procedure: ATRIAL FLUTTER ABLATION;  Surgeon: Thompson Grayer, MD;  Location[facility] C[facility] LAB;  Service: Cardiovascular;  Laterality: N/A;  . Cataracts    . COLONOSCOPY WITH PROPOFOL N/A 06/06/2017   Procedure: COLONOSCOPY WITH PROPOFOL;  Surgeon: [name], MD;  Location: [facility] ENDO SUITE;  Service: Endoscopy;  Laterality: N/A;  1:15pm  . IR GENERIC HISTORICAL  04/09/2016   IR US GUIDE VASC ACCESS RIGHT 04/09/2016 [name], DO [facility]-INTERV RAD  . IR GENERIC HISTORICAL  04/09/2016   IR FLUORO GUIDE PORT INSERTION RIGHT 04/09/2016 [name], DO [facility]-INTERV RAD  . PORT-A-CATH REMOVAL N/A 04/24/2017   Procedure: MINOR REMOVAL PORT-A-CATH;  Surgeon: [name], MD;  Location: [facility] ORS;  Service: General;  Laterality: N/A;  . PORTACATH PLACEMENT Left 04/22/2019   Procedure: INSERTION PORT-A-CATH;  Surgeon: Aviva Signs, MD;  Location: [facility] ORS;  Service: General;  Laterality: Left;  . TONSILLECTOMY      SOCIAL HISTORY:  Social History  Socioeconomic History  . Marital status: Single    Spouse name: Not on file  . Number of children: Not on file  . Years of education: Not on file  . Highest education level: Not on file  Occupational History  . Occupation: retired     Comment: Building services engineer  Tobacco Use  . Smoking status: Former Smoker    Packs/day: 0.50    Years: 47.00    Pack years: 23.50    Types: Cigarettes    Quit date: 05/30/2012    Years since quitting: 7.2  . Smokeless tobacco: Former Systems developer    Quit date: 04/10/2014  . Tobacco comment: encouraged to quit today 03/04/12  Vaping Use  . Vaping Use: Never used  Substance and Sexual Activity  . Alcohol  use: No    Comment: quit 3 1/2 years ago  . Drug use: Not Currently    Comment: marajuana occasionally  . Sexual activity: Not Currently  Other Topics Concern  . Not on file  Social History Narrative   Lives in [place] alone.   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 Difficulty of Paying Living Expenses:   Food Insecurity:   . Worried About Charity fundraiser in the Last Year:   . Arboriculturist in the Last Year:   Transportation Needs:   . Film/video editor (Medical):   Marland Kitchen Lack of Transportation (Non-Medical):   Physical Activity:   . Days of Exercise per Week:   . Minutes of Exercise per Session:   Stress:   . Feeling of Stress :   Social Connections:   . Frequency of Communication with Friends and Family:   . Frequency of Social Gatherings with Friends and Family:   . Attends Religious Services:   . Active Member of Clubs or Organizations:   . Attends Archivist Meetings:   Marland Kitchen Marital Status:   Intimate Partner Violence:   . Fear of Current or Ex-Partner:   . Emotionally Abused:   Marland Kitchen Physically Abused:   . Sexually Abused:     FAMILY HISTORY:  Family History  Problem Relation Age of Onset  . Diabetes Sister   . Hypertension Mother   . Lung cancer Mother   . Hypertension Father   . Heart attack Father   . Hypertension Sister   . Hypertension Sister   . Hypertension Sister   . Pulmonary fibrosis Sister   . Breast cancer Cousin   . Colon cancer Neg Hx     CURRENT MEDICATIONS:  Current Outpatient Medications  Medication Sig Dispense Refill  . apixaban (ELIQUIS) 5 MG TABS tablet Take 5 mg by mouth 2 (two) times daily.     Marland Kitchen AQUALANCE LANCETS 30G MISC USE TO check blood glucose twice daily  99  . atorvastatin (LIPITOR) 20 MG tablet Take 1 tablet (20 mg total) by mouth daily. 90 tablet 3  . diltiazem (DILACOR XR) 240 MG 24 hr capsule Take 240 mg by  mouth daily.    Marland Kitchen dofetilide (TIKOSYN) 250 MCG capsule Take 1 capsule (250 mcg total) by mouth 2 (two) times daily.    Marland Kitchen doxycycline (VIBRA-TABS) 100 MG tablet Take 100 mg by mouth 2 (two) times daily.    . fluticasone (FLONASE) 50 MCG/ACT nasal spray Place 2 sprays into both nostrils daily. 16 g 6  . Fluticasone-Umeclidin-Vilant (TRELEGY ELLIPTA)  100-62.5-25 MCG/INH AEPB Inhale 1 puff into the lungs daily.    . furosemide (LASIX) 40 MG tablet TAKE 1 TABLET BY MOUTH EVERY DAY 90 tablet 0  . gabapentin (NEURONTIN) 300 MG capsule Take 600 mg by mouth 2 (two) times daily.     Marland Kitchen guaifenesin (ROBITUSSIN) 100 MG/5ML syrup Take 200 mg by mouth at bedtime as needed for cough.     Marland Kitchen HM SENNA 8.6 MG tablet Take 2 tablets by mouth at bedtime.    Marland Kitchen HYDROcodone-acetaminophen (NORCO/VICODIN) 5-325 MG tablet Take 1 tablet by mouth at bedtime as needed. 30 tablet 0  . hydrOXYzine (VISTARIL) 25 MG capsule Take 25 mg by mouth every 8 (eight) hours.    . insulin lispro (HUMALOG) 100 UNIT/ML injection Inject 2-20 Units into the skin 3 (three) times daily with meals. Sliding scale     . Insulin Syringe-Needle U-100 28G X 1/2" 1 ML MISC 1 each by Does not apply route 3 (three) times daily. 100 each 3  . ipratropium-albuterol (DUONEB) 0.5-2.5 (3) MG/3ML SOLN Take 3 mLs by nebulization every 4 (four) hours as needed (shortness of breath).     Elmore Guise Devices (ADJUSTABLE LANCING DEVICE) MISC TO check blood glucose daily    . LANTUS SOLOSTAR 100 UNIT/ML Solostar Pen INJECT 20 UNITS UNDER THE SKIN EVERY MORNING AND INJECT 20 UNITS UNDER THE SKIN EVERY EVENING (Patient taking differently: Inject 20 Units into the skin 2 (two) times daily. ) 45 mL 0  . levalbuterol (XOPENEX) 0.63 MG/3ML nebulizer solution Inhale 3 mLs (0.63 mg total) into the lungs 4 (four) times daily. 3 mL 0  . Lidocaine (SALONPAS PAIN RELIEVING EX) Apply 1 patch topically daily.     Marland Kitchen lidocaine-prilocaine (EMLA) cream Apply a small amount to port a cath  site and cover with plastic wrap 1 hour prior to chemotherapy appointments 30 g 3  . metFORMIN (GLUCOPHAGE) 500 MG tablet Take 500 mg by mouth in the morning and at bedtime.    . metoprolol succinate (TOPROL-XL) 100 MG 24 hr tablet Take 100 mg by mouth daily.    . Multiple Vitamin (MULTIVITAMIN WITH MINERALS) TABS tablet Take 1 tablet by mouth daily.    . potassium chloride (KLOR-CON) 10 MEQ tablet Take 10 mEq by mouth 2 (two) times daily.    . predniSONE (DELTASONE) 20 MG tablet Take 60 mg by mouth daily.    . predniSONE (STERAPRED UNI-PAK 48 TAB) 5 MG (48) TBPK tablet TAKE DOSE PACK AS DIRECTED PER PACKAGE INSTRUCTIONS    . pregabalin (LYRICA) 150 MG capsule Take 150 mg by mouth in the morning and at bedtime.    . senna-docusate (SENOKOT-S) 8.6-50 MG tablet Take 2 tablets by mouth at bedtime. 60 tablet 0  . spironolactone (ALDACTONE) 25 MG tablet Take 12.5 mg by mouth 2 (two) times daily.     . tamsulosin (FLOMAX) 0.4 MG CAPS capsule Take 1 capsule (0.4 mg total) by mouth 2 (two) times daily after a meal. 180 capsule 1  . tiotropium (SPIRIVA HANDIHALER) 18 MCG inhalation capsule Place 1 capsule (18 mcg total) into inhaler and inhale daily. 30 capsule 12  . traZODone (DESYREL) 50 MG tablet Take 50 mg by mouth at bedtime.  5  . acetaminophen (TYLENOL) 500 MG tablet Take 1,000 mg by mouth every 6 (six) hours as needed for moderate pain or headache.  (Patient not taking: Reported on 08/26/2019)    . Artificial Saliva (BIOTENE DRY MOUTH MOISTURIZING) SOLN Apply 1 Dose topically as  needed (dry mouth).  (Patient not taking: Reported on 08/26/2019)    . benzonatate (TESSALON) 200 MG capsule Take 1 capsule (200 mg total) by mouth 3 (three) times daily as needed for cough. (Patient not taking: Reported on 08/26/2019) 30 capsule 0  . Carboxymethylcellul-Glycerin (LUBRICATING EYE DROPS OP) Place 1 drop into the left eye daily as needed (dry eyes).  (Patient not taking: Reported on 08/26/2019)    . PROAIR HFA 108 (90  Base) MCG/ACT inhaler INHALE TWO PUFFS BY MOUTH EVERY 4 HOURS AS NEEDED FOR WHEEZING (Patient not taking: Reported on 08/26/2019) 17 g 0  . prochlorperazine (COMPAZINE) 10 MG tablet Take 1 tablet (10 mg total) by mouth every 6 (six) hours as needed (Nausea or vomiting). (Patient not taking: Reported on 08/26/2019) 60 tablet 1   No current facility-administered medications for this visit.    ALLERGIES:  No Known Allergies  PHYSICAL EXAM:  Performance status (ECOG): 1 - Symptomatic but completely ambulatory  Vitals:   08/26/19 0812  BP: (!) 110/59  Pulse: 91  Resp: 18  Temp: (!) 96.9 F (36.1 C)  SpO2: 96%   Wt Readings from Last 3 Encounters:  08/26/19 246 lb 14.6 oz (112 kg)  08/05/19 243 lb 13.3 oz (110.6 kg)  07/23/19 241 lb 3.2 oz (109.4 kg)   Physical Exam Vitals reviewed.  Constitutional:      Appearance: Normal appearance. He is obese.  HENT:     Ears:   Cardiovascular:     Rate and Rhythm: Normal rate and regular rhythm.     Pulses: Normal pulses.     Heart sounds: Normal heart sounds.  Pulmonary:     Effort: Pulmonary effort is normal.     Breath sounds: Normal breath sounds.  Musculoskeletal:     Right lower leg: Edema (1+) present.     Left lower leg: Edema (1+) present.  Neurological:     General: No focal deficit present.     Mental Status: He is alert and oriented to person, place, and time.  Psychiatric:        Mood and Affect: Mood normal.        Behavior: Behavior normal.     LABORATORY DATA:  I have reviewed the labs as listed.  CBC Latest Ref Rng & Units 08/26/2019 08/05/2019 07/15/2019  WBC 4.0 - 10.5 K/uL 8.0 8.0 8.4  Hemoglobin 13.0 - 17.0 g/dL 9.7(L) 10.6(L) 10.5(L)  Hematocrit 39 - 52 % 32.1(L) 34.2(L) 35.0(L)  Platelets 150 - 400 K/uL 164 146(L) 207   CMP Latest Ref Rng & Units 08/26/2019 08/05/2019 07/15/2019  Glucose 70 - 99 mg/dL 251(H) 393(H) 321(H)  BUN 8 - 23 mg/dL 11 20 18   Creatinine 0.61 - 1.24 mg/dL 1.08 1.17 1.08  Sodium 135 - 145  mmol/L 138 134(L) 134(L)  Potassium 3.5 - 5.1 mmol/L 4.3 4.3 3.9  Chloride 98 - 111 mmol/L 99 93(L) 92(L)  CO2 22 - 32 mmol/L 28 27 30   Calcium 8.9 - 10.3 mg/dL 8.4(L) 8.9 9.0  Total Protein 6.5 - 8.1 g/dL 6.1(L) 6.2(L) 6.4(L)  Total Bilirubin 0.3 - 1.2 mg/dL 0.4 0.5 0.3  Alkaline Phos 38 - 126 U/L 80 69 56  AST 15 - 41 U/L 29 25 22   ALT 0 - 44 U/L 41 40 29    DIAGNOSTIC IMAGING:  I have independently reviewed the scans and discussed with the patient. MR Brain W Wo Contrast  Result Date: 08/01/2019 CLINICAL DATA:  Small cell lung cancer with  prophylactic whole-brain radiotherapy. EXAM: MRI HEAD WITHOUT AND WITH CONTRAST TECHNIQUE: Multiplanar, multiecho pulse sequences of the brain and surrounding structures were obtained without and with intravenous contrast. CONTRAST:  85mL GADAVIST GADOBUTROL 1 MMOL/ML IV SOLN COMPARISON:  05/12/2019 FINDINGS: Brain: 7 mm ring-enhancing mass near the cortex of the left occipital temporal junction with minimal if any vasogenic edema. No new lesion. Generalized brain atrophy and extensive white matter FLAIR hyperintensity. There has been prior whole-brain radiotherapy. No acute or subacute infarct, hemorrhage, hydrocephalus, or extra-axial collection. Vascular: Preserved flow voids and vascular enhancements Skull and upper cervical spine: Normal marrow signal Sinuses/Orbits: Bilateral cataract resection. Minor sinus mucosal thickening. IMPRESSION: Size stable 7 mm solitary metastasis at the left occipital temporal junction. Electronically Signed   By: Monte Fantasia M.D.   On: 08/01/2019 09:55     ASSESSMENT:  1. Recurrent small cell lung cancer: -Treated for limited stage small cell lung cancer with chemoradiation with cisplatin and etoposide therapy on 06/21/2016 followed by PCI. -CT scan on 04/17/2019 showed worsening right lung nodules. Left lower lobe infectious cavitary lesion was decreased in size. New right middle lobe lung nodule present. -3 cycles  of carboplatin, VP-16 and atezolizumab from 05/06/2019 through 06/17/2019. -CT CAP on 07/07/2019 showed improvement in the right lung cancer. Interval development of consolidative change in the left upper lobe consistent with infection/inflammation.  2. Brain metastasis: -MRI of the brain on 05/13/2019 showed small 7 mm ring enhancing lesion at the junction of the left posterior temporal and anterior occipital lobes compatible with solitary metastasis. Typical appearance of lacunar infarct is also considered. -MRI of the brain on 07/31/2019 shows stable 7 mm solitary metastasis in the left occipital temporal junction.   PLAN:  1. Recurrent small cell lung cancer: -He has tolerated cycle 5 extremely well. -He will proceed with cycle 6 today.  I have reviewed his labs including chemistries and CBC which are adequate to proceed. -I plan to see him back in 3 weeks for follow-up.  I plan to repeat CT CAP prior to next visit to evaluate response. -He will continue with Atezolizumab maintenance starting next visit. -He has mobility issues due to weak upper body strength.  Hence he cannot propel a wheelchair.  I think he would benefit from electric scooter.  2. CKD: -Creatinine continues to stay around 1-1 0.1.  3. Diabetes: -Continue Lantus 25 units twice daily and NovoLog sliding scale.  4. Atrial fibrillation: -Continue Eliquis 5 mg twice daily.  5. Low back pain: -Continue hydrocodone 5/325 at bedtime as needed.  6.  Brain metastasis: -Plan to do SBRT upon completion of chemotherapy.  He is asymptomatic.  7.  Lower extremity swelling: -He is not taking Lasix.  Continue spironolactone 12.5 mg twice daily.   Orders placed this encounter:  No orders of the defined types were placed in this encounter.    Derek Jack, MD [place] [phone]   I, Milinda Antis, am acting as a scribe for Dr. Sanda Linger.  I, Derek Jack MD, have  reviewed the above documentation for accuracy and completeness, and I agree with the above.

## 2019-08-26 NOTE — Progress Notes (Signed)
Labs reviewed today by MD at office visit. Will proceed with treatment per MD.  Treatment given per orders. Patient tolerated it well without problems. Vitals stable and discharged home from clinic via wheelchair. Follow up as schedule.

## 2019-08-26 NOTE — Patient Instructions (Signed)
[place] at [facility] Discharge Instructions  You were seen today by Dr. Delton Coombes. He went over your recent results. You received your treatment today. You will be scheduled for a CT scan of your chest and abdomen before your next visit. Dr. Delton Coombes will see you back in 3 weeks for labs and follow up.   Thank you for choosing [place] at [facility] to provide your oncology and hematology care.  To afford each patient quality time with our provider, please arrive at least 15 minutes before your scheduled appointment time.   If you have a lab appointment with the [facility] please come in thru the Main Entrance and check in at the main information desk  You need to re-schedule your appointment should you arrive 10 or more minutes late.  We strive to give you quality time with our providers, and arriving late affects you and other patients whose appointments are after yours.  Also, if you no show three or more times for appointments you may be dismissed from the clinic at the providers discretion.     Again, thank you for choosing [facility]  Our hope is that these requests will decrease the amount of time that you wait before being seen by our physicians.       _____________________________________________________________  Should you have questions after your visit to [facility], please contact our office at [phone] between the hours of 8:00 a.m. and 4:30 p.m.  Voicemails left after 4:00 p.m. will not be returned until the following business day.  For prescription refill requests, have your pharmacy contact our office and allow 72 hours.    Cancer Center Support Programs:   > Cancer Support Group  2nd Tuesday of the month 1pm-2pm, Journey Room

## 2019-08-26 NOTE — Patient Instructions (Signed)
[facility] Discharge Instructions for Patients Receiving Chemotherapy  Today you received the following chemotherapy agents   To help prevent nausea and vomiting after your treatment, we encourage you to take your nausea medication   If you develop nausea and vomiting that is not controlled by your nausea medication, call the clinic.   BELOW ARE SYMPTOMS THAT SHOULD BE REPORTED IMMEDIATELY:  *FEVER GREATER THAN 100.5 F  *CHILLS WITH OR WITHOUT FEVER  NAUSEA AND VOMITING THAT IS NOT CONTROLLED WITH YOUR NAUSEA MEDICATION  *UNUSUAL SHORTNESS OF BREATH  *UNUSUAL BRUISING OR BLEEDING  TENDERNESS IN MOUTH AND THROAT WITH OR WITHOUT PRESENCE OF ULCERS  *URINARY PROBLEMS  *BOWEL PROBLEMS  UNUSUAL RASH Items with * indicate a potential emergency and should be followed up as soon as possible.  Feel free to call the clinic should you have any questions or concerns. The clinic phone number is [phone].  Please show the CHEMO ALERT CARD at check-in to the Emergency Department and triage nurse.   

## 2019-08-27 ENCOUNTER — Other Ambulatory Visit (HOSPITAL_COMMUNITY): Payer: Self-pay

## 2019-08-27 ENCOUNTER — Inpatient Hospital Stay (HOSPITAL_COMMUNITY): Payer: Medicare Other

## 2019-08-27 VITALS — BP 98/51 | HR 72 | Temp 97.5°F | Resp 18

## 2019-08-27 DIAGNOSIS — C3491 Malignant neoplasm of unspecified part of right bronchus or lung: Secondary | ICD-10-CM

## 2019-08-27 DIAGNOSIS — Z5112 Encounter for antineoplastic immunotherapy: Secondary | ICD-10-CM | POA: Diagnosis not present

## 2019-08-27 MED ORDER — SODIUM CHLORIDE 0.9 % IV SOLN: Freq: Once | INTRAVENOUS | Status: AC

## 2019-08-27 MED ORDER — SODIUM CHLORIDE 0.9 % IV SOLN
80.0000 mg/m2 | Freq: Once | INTRAVENOUS | Status: AC
  Administered 2019-08-27: 190 mg via INTRAVENOUS
  Filled 2019-08-27: qty 9.5

## 2019-08-27 MED ORDER — MISC. DEVICES MISC: 0 refills | Status: AC

## 2019-08-27 MED ORDER — SODIUM CHLORIDE 0.9 % IV SOLN
10.0000 mg | Freq: Once | INTRAVENOUS | Status: AC
  Administered 2019-08-27: 10 mg via INTRAVENOUS
  Filled 2019-08-27: qty 10

## 2019-08-27 MED ORDER — HEPARIN SOD (PORK) LOCK FLUSH 100 UNIT/ML IV SOLN
500.0000 [IU] | Freq: Once | INTRAVENOUS | Status: AC | PRN
  Administered 2019-08-27: 500 [IU]

## 2019-08-27 NOTE — Progress Notes (Signed)
Tolerated infusion w/o adverse reaction.  Alert, in no distress.  VSS.  Discharged via wheelchair and escorted by NT to front entrance of hospital.

## 2019-08-28 ENCOUNTER — Other Ambulatory Visit: Payer: Self-pay

## 2019-08-28 ENCOUNTER — Inpatient Hospital Stay (HOSPITAL_COMMUNITY): Payer: Medicare Other

## 2019-08-28 ENCOUNTER — Encounter (HOSPITAL_COMMUNITY): Payer: Self-pay

## 2019-08-28 VITALS — BP 116/51 | HR 72 | Temp 97.7°F | Resp 18

## 2019-08-28 DIAGNOSIS — Z5112 Encounter for antineoplastic immunotherapy: Secondary | ICD-10-CM | POA: Diagnosis not present

## 2019-08-28 DIAGNOSIS — C3491 Malignant neoplasm of unspecified part of right bronchus or lung: Secondary | ICD-10-CM

## 2019-08-28 MED ORDER — SODIUM CHLORIDE 0.9 % IV SOLN
80.0000 mg/m2 | Freq: Once | INTRAVENOUS | Status: AC
  Administered 2019-08-28: 190 mg via INTRAVENOUS
  Filled 2019-08-28: qty 9.5

## 2019-08-28 MED ORDER — HEPARIN SOD (PORK) LOCK FLUSH 100 UNIT/ML IV SOLN
500.0000 [IU] | Freq: Once | INTRAVENOUS | Status: AC | PRN
  Administered 2019-08-28: 500 [IU]

## 2019-08-28 MED ORDER — PEGFILGRASTIM-JMDB 6 MG/0.6ML ~~LOC~~ SOSY
PREFILLED_SYRINGE | SUBCUTANEOUS | Status: AC
  Filled 2019-08-28: qty 0.6

## 2019-08-28 MED ORDER — SODIUM CHLORIDE 0.9 % IV SOLN: Freq: Once | INTRAVENOUS | Status: AC

## 2019-08-28 MED ORDER — SODIUM CHLORIDE 0.9% FLUSH
10.0000 mL | INTRAVENOUS | Status: DC | PRN
  Administered 2019-08-28: 10 mL

## 2019-08-28 MED ORDER — SODIUM CHLORIDE 0.9 % IV SOLN
10.0000 mg | Freq: Once | INTRAVENOUS | Status: AC
  Administered 2019-08-28: 10 mg via INTRAVENOUS
  Filled 2019-08-28: qty 10

## 2019-08-28 NOTE — Progress Notes (Signed)
Patient presents today for treatment. Vital signs are within parameters for treatment. Patient denies pain today. Patient denies any significant changes since yesterday's treatment.   Treatment given today per MD orders. Tolerated infusion without adverse affects. Vital signs stable. No complaints at this time. Discharged from clinic wheel chair.  F/U with [facility] as scheduled.

## 2019-08-28 NOTE — Patient Instructions (Signed)

## 2019-08-31 ENCOUNTER — Inpatient Hospital Stay (HOSPITAL_COMMUNITY): Payer: Medicare Other

## 2019-08-31 ENCOUNTER — Other Ambulatory Visit: Payer: Self-pay

## 2019-08-31 ENCOUNTER — Encounter (HOSPITAL_COMMUNITY): Payer: Self-pay

## 2019-08-31 VITALS — BP 105/56 | HR 74 | Temp 97.5°F | Resp 18

## 2019-08-31 DIAGNOSIS — C3491 Malignant neoplasm of unspecified part of right bronchus or lung: Secondary | ICD-10-CM

## 2019-08-31 DIAGNOSIS — Z5112 Encounter for antineoplastic immunotherapy: Secondary | ICD-10-CM | POA: Diagnosis not present

## 2019-08-31 MED ORDER — PEGFILGRASTIM-CBQV 6 MG/0.6ML ~~LOC~~ SOSY
6.0000 mg | PREFILLED_SYRINGE | Freq: Once | SUBCUTANEOUS | Status: AC
  Administered 2019-08-31: 6 mg via SUBCUTANEOUS
  Filled 2019-08-31: qty 0.6

## 2019-08-31 NOTE — Progress Notes (Signed)
Pt here today for Udenyca injection. Pt given injection in left arm. Pt tolerated injection well with no complaints. Pt stable and discharged home via wheelchair. Pt to return as scheduled.

## 2019-09-14 ENCOUNTER — Ambulatory Visit (HOSPITAL_COMMUNITY): Admission: RE | Admit: 2019-09-14 | Payer: Medicare Other | Source: Ambulatory Visit

## 2019-09-16 ENCOUNTER — Encounter (HOSPITAL_COMMUNITY): Payer: Self-pay | Admitting: Hematology

## 2019-09-16 ENCOUNTER — Inpatient Hospital Stay (HOSPITAL_COMMUNITY): Payer: Medicare Other

## 2019-09-16 ENCOUNTER — Inpatient Hospital Stay (HOSPITAL_BASED_OUTPATIENT_CLINIC_OR_DEPARTMENT_OTHER): Payer: Medicare Other | Admitting: Hematology

## 2019-09-16 ENCOUNTER — Other Ambulatory Visit: Payer: Self-pay

## 2019-09-16 VITALS — BP 131/56 | HR 88 | Temp 98.0°F | Resp 16

## 2019-09-16 VITALS — BP 133/56 | HR 90 | Temp 97.0°F | Resp 20 | Wt 241.8 lb

## 2019-09-16 DIAGNOSIS — C3491 Malignant neoplasm of unspecified part of right bronchus or lung: Secondary | ICD-10-CM

## 2019-09-16 DIAGNOSIS — Z5112 Encounter for antineoplastic immunotherapy: Secondary | ICD-10-CM | POA: Diagnosis not present

## 2019-09-16 LAB — CBC WITH DIFFERENTIAL/PLATELET
Abs Immature Granulocytes: 0.11 10*3/uL — ABNORMAL HIGH (ref 0.00–0.07)
Basophils Absolute: 0 10*3/uL (ref 0.0–0.1)
Basophils Relative: 0 %
Eosinophils Absolute: 0.1 10*3/uL (ref 0.0–0.5)
Eosinophils Relative: 1 %
HCT: 32.1 % — ABNORMAL LOW (ref 39.0–52.0)
Hemoglobin: 9.7 g/dL — ABNORMAL LOW (ref 13.0–17.0)
Immature Granulocytes: 1 %
Lymphocytes Relative: 3 %
Lymphs Abs: 0.4 10*3/uL — ABNORMAL LOW (ref 0.7–4.0)
MCH: 32.6 pg (ref 26.0–34.0)
MCHC: 30.2 g/dL (ref 30.0–36.0)
MCV: 107.7 fL — ABNORMAL HIGH (ref 80.0–100.0)
Monocytes Absolute: 1 10*3/uL (ref 0.1–1.0)
Monocytes Relative: 9 %
Neutro Abs: 8.7 10*3/uL — ABNORMAL HIGH (ref 1.7–7.7)
Neutrophils Relative %: 86 %
Platelets: 161 10*3/uL (ref 150–400)
RBC: 2.98 MIL/uL — ABNORMAL LOW (ref 4.22–5.81)
RDW: 17.4 % — ABNORMAL HIGH (ref 11.5–15.5)
WBC: 10.2 10*3/uL (ref 4.0–10.5)
nRBC: 0 % (ref 0.0–0.2)

## 2019-09-16 LAB — COMPREHENSIVE METABOLIC PANEL
ALT: 31 U/L (ref 0–44)
AST: 27 U/L (ref 15–41)
Albumin: 3.6 g/dL (ref 3.5–5.0)
Alkaline Phosphatase: 72 U/L (ref 38–126)
Anion gap: 9 (ref 5–15)
BUN: 13 mg/dL (ref 8–23)
CO2: 28 mmol/L (ref 22–32)
Calcium: 8.8 mg/dL — ABNORMAL LOW (ref 8.9–10.3)
Chloride: 97 mmol/L — ABNORMAL LOW (ref 98–111)
Creatinine, Ser: 0.9 mg/dL (ref 0.61–1.24)
GFR calc Af Amer: >60 mL/min (ref >60)
GFR calc non Af Amer: >60 mL/min (ref >60)
Glucose, Bld: 236 mg/dL — ABNORMAL HIGH (ref 70–99)
Potassium: 3.7 mmol/L (ref 3.5–5.1)
Sodium: 134 mmol/L — ABNORMAL LOW (ref 135–145)
Total Bilirubin: 0.6 mg/dL (ref 0.3–1.2)
Total Protein: 6.4 g/dL — ABNORMAL LOW (ref 6.5–8.1)

## 2019-09-16 LAB — TSH: TSH: 3.33 u[IU]/mL (ref 0.350–4.500)

## 2019-09-16 MED ORDER — HEPARIN SOD (PORK) LOCK FLUSH 100 UNIT/ML IV SOLN
500.0000 [IU] | Freq: Once | INTRAVENOUS | Status: AC | PRN
  Administered 2019-09-16: 500 [IU]

## 2019-09-16 MED ORDER — IPRATROPIUM-ALBUTEROL 0.5-2.5 (3) MG/3ML IN SOLN
RESPIRATORY_TRACT | Status: AC
  Filled 2019-09-16: qty 3

## 2019-09-16 MED ORDER — SODIUM CHLORIDE 0.9% FLUSH
10.0000 mL | INTRAVENOUS | Status: DC | PRN
  Administered 2019-09-16: 10 mL

## 2019-09-16 MED ORDER — SODIUM CHLORIDE 0.9 % IV SOLN
1200.0000 mg | Freq: Once | INTRAVENOUS | Status: AC
  Administered 2019-09-16: 1200 mg via INTRAVENOUS
  Filled 2019-09-16: qty 20

## 2019-09-16 MED ORDER — IPRATROPIUM-ALBUTEROL 0.5-2.5 (3) MG/3ML IN SOLN
3.0000 mL | Freq: Once | RESPIRATORY_TRACT | Status: AC
  Administered 2019-09-16: 3 mL via RESPIRATORY_TRACT

## 2019-09-16 MED ORDER — SODIUM CHLORIDE 0.9 % IV SOLN: Freq: Once | INTRAVENOUS | Status: AC

## 2019-09-16 NOTE — Progress Notes (Signed)
[id number] Labs reviewed with and pt seen by Dr. Delton Coombes and pt approved for Tecentriq infusion today per MD.1108 Pt requested a nebulizer tx which he normally uses at home.O2 sat 96% on 2 L via N/C, Resp 20 at this time Dr. Delton Coombes notified and order obtained for Duo-neb x 1 today.                          Chase Herrera tolerated Tecentriq infusion well without complaints or incident. VSS upon discharge. Pt discharged via wheelchair in satisfactory condition

## 2019-09-16 NOTE — Patient Instructions (Signed)
[facility] Discharge Instructions for Patients Receiving Chemotherapy   Beginning January 23rd 2017 lab work for the [facility] will be done in the  Main lab at [facility] on 1st floor. If you have a lab appointment with the [facility] please come in thru the  Main Entrance and check in at the main information desk   Today you received the following chemotherapy agents Tecentriq. Follow-up as scheduled  To help prevent nausea and vomiting after your treatment, we encourage you to take your nausea medication   If you develop nausea and vomiting, or diarrhea that is not controlled by your medication, call the clinic.  The clinic phone number is [phone]. Office hours are Monday-Friday 8:30am-5:00pm.  BELOW ARE SYMPTOMS THAT SHOULD BE REPORTED IMMEDIATELY:  *FEVER GREATER THAN 101.0 F  *CHILLS WITH OR WITHOUT FEVER  NAUSEA AND VOMITING THAT IS NOT CONTROLLED WITH YOUR NAUSEA MEDICATION  *UNUSUAL SHORTNESS OF BREATH  *UNUSUAL BRUISING OR BLEEDING  TENDERNESS IN MOUTH AND THROAT WITH OR WITHOUT PRESENCE OF ULCERS  *URINARY PROBLEMS  *BOWEL PROBLEMS  UNUSUAL RASH Items with * indicate a potential emergency and should be followed up as soon as possible. If you have an emergency after office hours please contact your primary care physician or go to the nearest emergency department.  Please call the clinic during office hours if you have any questions or concerns.   You may also contact the Patient Navigator at [phone] should you have any questions or need assistance in obtaining follow up care.      Resources For Cancer Patients and their Caregivers ? American Cancer Society: Can assist with transportation, wigs, general needs, runs Look Good Feel Better.        561-272-3122 ? Cancer Care: Provides financial assistance, online support groups, medication/co-pay assistance.  1-800-813-HOPE 564-545-2773) ? [facility] Assists [place] cancer patients and their families through emotional , educational and financial support.  605-374-7587 ? [place] DSS Where to apply for food stamps, Medicaid and utility assistance. (670)874-8832 ? [facility]: Transportation to medical appointments. 734-263-5527 ? Social Security Administration: May apply for disability if have a Stage IV cancer. [phone] 701-800-2945 ? [employer], Disability and Transit Services: Assists with nutrition, care and transit needs. [phone]

## 2019-09-16 NOTE — Progress Notes (Signed)
Chase Herrera, [place] [address]   CLINIC:  Medical Oncology/Hematology  PCP:  [facility] [place] / [place] [address] [phone]   REASON FOR VISIT:  Follow-up for small cell lung cancer  PRIOR THERAPY: Carboplatin, VP-16 and atezolizumab x 3 cycles from 05/06/2019 to 08/28/2019.  NGS Results: Not done  CURRENT THERAPY: Atezolizumab every 3 weeks  BRIEF ONCOLOGIC HISTORY:  Oncology History  Small cell lung cancer ([place])  04/03/2016 Initial Diagnosis   Small cell lung cancer ([place])   04/09/2016 Imaging   MRI brain- Negative for metastatic disease to the brain. No acute abnormality.   04/09/2016 Procedure   Right IJ port catheter placement by IR   04/13/2016 PET scan   . The right apical lung mass has significantly enlarged and there is new right paratracheal adenopathy as well as a hypermetabolic new lymph node in the right inner infraclavicular fossa potentially with early impingement on adjacent neurovascular structures. 2. Other imaging findings of potential clinical significance: Chronic bilateral maxillary sinusitis. Left carotid atherosclerotic calcification. Aortoiliac atherosclerotic vascular disease. Coronary atherosclerosis. Paraseptal emphysema. Mild atelectasis in the lung bases. Scattered sigmoid colon diverticula. Probable right pars defect at L5.   05/09/2016 - 07/12/2016 Radiation Therapy   Radiotherapy to right Lung+MS 63 Gy/35 fractions at 1.8 GY/f using 6 x photons 3- D CRT technique Dates: 4/18-6/21/18       06/19/2016 Treatment Plan Change   Cisplatin dose reduced by 20% for cycle #4 due to severe thrombocytopenia following cycle #3 (13,000).   08/21/2016 PET scan   1. Marked reduction in size and metabolic activity of RIGHT upper lobe mass. 2. Resolution of mediastinal and RIGHT supraclavicular lymphadenopathy. 3. No evidence of disease progression.   05/06/2019 -   Chemotherapy   The patient had palonosetron (ALOXI) injection 0.25 mg, 0.25 mg, Intravenous,  Once, 6 of 6 cycles Administration: 0.25 mg (05/06/2019), 0.25 mg (05/27/2019), 0.25 mg (06/17/2019), 0.25 mg (07/15/2019), 0.25 mg (08/05/2019), 0.25 mg (08/26/2019) pegfilgrastim-cbqv (UDENYCA) injection 6 mg, 6 mg, Subcutaneous, Once, 6 of 6 cycles Administration: 6 mg (05/11/2019), 6 mg (06/01/2019), 6 mg (06/23/2019), 6 mg (07/20/2019), 6 mg (08/10/2019) CARBOplatin (PARAPLATIN) 450 mg in sodium chloride 0.9 % 250 mL chemo infusion, 450 mg (100 % of original dose 446.4 mg), Intravenous,  Once, 6 of 6 cycles Dose modification:   (original dose 446.4 mg, Cycle 1, Reason: Provider Judgment),   (original dose 500 mg, Cycle 2, Reason: Provider Judgment), 500 mg (original dose 500 mg, Cycle 2, Reason: Provider Judgment), 500 mg (original dose 500 mg, Cycle 3),   (original dose 527.6 mg, Cycle 4, Reason: Provider Judgment) Administration: 450 mg (05/06/2019), 500 mg (05/27/2019), 500 mg (06/17/2019), 540 mg (07/15/2019), 510 mg (08/05/2019), 540 mg (08/26/2019) etoposide (VEPESID) 190 mg in sodium chloride 0.9 % 500 mL chemo infusion, 80 mg/m2 = 190 mg (80 % of original dose 100 mg/m2), Intravenous,  Once, 6 of 6 cycles Dose modification: 80 mg/m2 (80 % of original dose 100 mg/m2, Cycle 1, Reason: Provider Judgment), 80 mg/m2 (original dose 100 mg/m2, Cycle 1, Reason: Provider Judgment), 80 mg/m2 (80 % of original dose 100 mg/m2, Cycle 2, Reason: Provider Judgment) Administration: 190 mg (05/06/2019), 190 mg (05/07/2019), 190 mg (05/08/2019), 190 mg (05/27/2019), 190 mg (05/28/2019), 190 mg (05/29/2019), 190 mg (06/17/2019), 190 mg (06/18/2019), 190 mg (06/19/2019), 190 mg (07/15/2019), 190 mg (07/16/2019), 190 mg (07/17/2019), 190 mg (08/05/2019), 190 mg (08/06/2019), 190 mg (08/07/2019), 190 mg (  08/26/2019), 190 mg (08/27/2019), 190 mg (08/28/2019) fosaprepitant (EMEND) 150 mg in sodium chloride 0.9 % 145 mL IVPB, 150 mg, Intravenous,  Once, 6 of 6  cycles Administration: 150 mg (05/06/2019), 150 mg (05/27/2019), 150 mg (06/17/2019), 150 mg (07/15/2019), 150 mg (08/05/2019), 150 mg (08/26/2019) atezolizumab (TECENTRIQ) 1,200 mg in sodium chloride 0.9 % 250 mL chemo infusion, 1,200 mg, Intravenous, Once, 6 of 10 cycles Administration: 1,200 mg (05/06/2019), 1,200 mg (05/27/2019), 1,200 mg (06/17/2019), 1,200 mg (07/15/2019), 1,200 mg (08/05/2019), 1,200 mg (08/26/2019)  for chemotherapy treatment.      CANCER STAGING: Cancer Staging Small cell lung cancer ([place]) Staging form: Lung, AJCC 8th Edition - Clinical: Stage IIIC (cT3, cN3, cM0) - Signed by Twana First, MD on 04/16/2016   INTERVAL HISTORY:  Chase Herrera, a 69 y.o. male, returns for routine follow-up and consideration for next cycle of chemotherapy. [place] was last seen on 08/26/2019.  Due for cycle #7 of atezolizumab today.   Today he reports that his breathing is getting worse after getting the previous treatment and needs nebulizer treatments. He denies having N/V since the last treatment. He continues having productive cough with green sputum.  Overall, he feels ready for next cycle of chemo today.    REVIEW OF SYSTEMS:  Review of Systems  Constitutional: Positive for appetite change (moderately decreased) and fatigue (moderate).  Respiratory: Positive for cough (productive w/ green sputum) and shortness of breath (worse since Tx).   All other systems reviewed and are negative.   PAST MEDICAL/SURGICAL HISTORY:  Past Medical History:  Diagnosis Date  . Alcohol abuse    Quit 08/23/11  . Arthritis   . Atrial fibrillation ([place])   . Atrial flutter ([place])    CTI ablation by Dr [name] 10/2011  . Bacteremia due to Gram-positive bacteria 11/29/2016  . CHF (congestive heart failure) ([place])   . COPD (chronic obstructive pulmonary disease) ([place])   . Depression   . Essential hypertension   . History of cardiomyopathy    LVEF 25-30% 08/2011 with subsequent normalization  . History of  kidney stones   . Obesity   . Prostate enlargement   . Small cell lung cancer ([place])    Right upper lobe - follows with [facility]  . Type 2 diabetes mellitus ([place])    Past Surgical History:  Procedure Laterality Date  . APPENDECTOMY    . ATRIAL ABLATION SURGERY  11/06/2011   CTI ablation for atrial flutter by Dr [name]  . ATRIAL FLUTTER ABLATION N/A 11/06/2011   Procedure: ATRIAL FLUTTER ABLATION;  Surgeon: Thompson Grayer, MD;  Location: [facility] CATH LAB;  Service: Cardiovascular;  Laterality: N/A;  . Cataracts    . COLONOSCOPY WITH PROPOFOL N/A 06/06/2017   Procedure: COLONOSCOPY WITH PROPOFOL;  Surgeon: [name], MD;  Location: [facility] ENDO SUITE;  Service: Endoscopy;  Laterality: N/A;  1:15pm  . IR GENERIC HISTORICAL  04/09/2016   IR US GUIDE VASC ACCESS RIGHT 04/09/2016 [name], DO [facility]-INTERV RAD  . IR GENERIC HISTORICAL  04/09/2016   IR FLUORO GUIDE PORT INSERTION RIGHT 04/09/2016 [name], DO [facility]-INTERV RAD  . PORT-A-CATH REMOVAL N/A 04/24/2017   Procedure: MINOR REMOVAL PORT-A-CATH;  Surgeon: [name], MD;  Location: [facility] ORS;  Service: General;  Laterality: N/A;  . PORTACATH PLACEMENT Left 04/22/2019   Procedure: INSERTION PORT-A-CATH;  Surgeon: Aviva Signs, MD;  Location: [facility] ORS;  Service: General;  Laterality: Left;  . TONSILLECTOMY      SOCIAL HISTORY:  Social  History   Socioeconomic History  . Marital status: Single    Spouse name: Not on file  . Number of children: Not on file  . Years of education: Not on file  . Highest education level: Not on file  Occupational History  . Occupation: retired     Comment: Building services engineer  Tobacco Use  . Smoking status: Former Smoker    Packs/day: 0.50    Years: 47.00    Pack years: 23.50    Types: Cigarettes    Quit date: 05/30/2012    Years since quitting: 7.3  . Smokeless tobacco: Former Systems developer    Quit date: 04/10/2014  . Tobacco comment: encouraged to quit today 03/04/12  Vaping Use  . Vaping Use: Never used   Substance and Sexual Activity  . Alcohol use: No    Comment: quit 3 1/2 years ago  . Drug use: Not Currently    Comment: marajuana occasionally  . Sexual activity: Not Currently  Other Topics Concern  . Not on file  Social History Narrative   Lives in [place] alone.   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 Difficulty of Paying Living Expenses: Not on file  Food Insecurity:   . Worried About Charity fundraiser in the Last Year: Not on file  . Ran Out of Food in the Last Year: Not on file  Transportation Needs:   . Lack of Transportation (Medical): Not on file  . Lack of Transportation (Non-Medical): Not on file  Physical Activity:   . Days of Exercise per Week: Not on file  . Minutes of Exercise per Session: Not on file  Stress:   . Feeling of Stress : Not on file  Social Connections:   . Frequency of Communication with Friends and Family: Not on file  . Frequency of Social Gatherings with Friends and Family: Not on file  . Attends Religious Services: Not on file  . Active Member of Clubs or Organizations: Not on file  . Attends Archivist Meetings: Not on file  . Marital Status: Not on file  Intimate Partner Violence:   . Fear of Current or Ex-Partner: Not on file  . Emotionally Abused: Not on file  . Physically Abused: Not on file  . Sexually Abused: Not on file    FAMILY HISTORY:  Family History  Problem Relation Age of Onset  . Diabetes Sister   . Hypertension Mother   . Lung cancer Mother   . Hypertension Father   . Heart attack Father   . Hypertension Sister   . Hypertension Sister   . Hypertension Sister   . Pulmonary fibrosis Sister   . Breast cancer Cousin   . Colon cancer Neg Hx     CURRENT MEDICATIONS:  Current Outpatient Medications  Medication Sig Dispense Refill  . acetaminophen (TYLENOL) 500 MG tablet Take 1,000 mg by mouth every 6  (six) hours as needed for moderate pain or headache.     Marland Kitchen apixaban (ELIQUIS) 5 MG TABS tablet Take 5 mg by mouth 2 (two) times daily.     Marland Kitchen AQUALANCE LANCETS 30G MISC USE TO check blood glucose twice daily  99  . Artificial Saliva (BIOTENE DRY MOUTH MOISTURIZING) SOLN Apply 1 Dose topically as needed (dry mouth).     Marland Kitchen atorvastatin (LIPITOR) 20 MG tablet Take 1 tablet (20 mg total) by  mouth daily. 90 tablet 3  . benzonatate (TESSALON) 200 MG capsule Take 1 capsule (200 mg total) by mouth 3 (three) times daily as needed for cough. 30 capsule 0  . Carboxymethylcellul-Glycerin (LUBRICATING EYE DROPS OP) Place 1 drop into the left eye daily as needed (dry eyes).     Marland Kitchen diltiazem (DILACOR XR) 240 MG 24 hr capsule Take 240 mg by mouth daily.    Marland Kitchen dofetilide (TIKOSYN) 250 MCG capsule Take 1 capsule (250 mcg total) by mouth 2 (two) times daily.    . fluticasone (FLONASE) 50 MCG/ACT nasal spray Place 2 sprays into both nostrils daily. 16 g 6  . Fluticasone-Umeclidin-Vilant (TRELEGY ELLIPTA) 100-62.5-25 MCG/INH AEPB Inhale 1 puff into the lungs daily.    . furosemide (LASIX) 40 MG tablet TAKE 1 TABLET BY MOUTH EVERY DAY 90 tablet 0  . gabapentin (NEURONTIN) 300 MG capsule Take 600 mg by mouth 2 (two) times daily.     Marland Kitchen GLOBAL EASE INJECT PEN NEEDLES 31G X 8 MM MISC Inject into the skin 3 (three) times daily.    Marland Kitchen guaifenesin (ROBITUSSIN) 100 MG/5ML syrup Take 200 mg by mouth at bedtime as needed for cough.     Marland Kitchen HM SENNA 8.6 MG tablet Take 2 tablets by mouth at bedtime.    Marland Kitchen HYDROcodone-acetaminophen (NORCO/VICODIN) 5-325 MG tablet Take 1 tablet by mouth at bedtime as needed. 30 tablet 0  . hydrOXYzine (VISTARIL) 25 MG capsule Take 25 mg by mouth every 8 (eight) hours.    . insulin lispro (HUMALOG) 100 UNIT/ML injection Inject 2-20 Units into the skin 3 (three) times daily with meals. Sliding scale     . Insulin Syringe-Needle U-100 28G X 1/2" 1 ML MISC 1 each by Does not apply route 3 (three) times daily.  100 each 3  . ipratropium-albuterol (DUONEB) 0.5-2.5 (3) MG/3ML SOLN Take 3 mLs by nebulization every 4 (four) hours as needed (shortness of breath).     Elmore Guise Devices (ADJUSTABLE LANCING DEVICE) MISC TO check blood glucose daily    . LANTUS SOLOSTAR 100 UNIT/ML Solostar Pen INJECT 20 UNITS UNDER THE SKIN EVERY MORNING AND INJECT 20 UNITS UNDER THE SKIN EVERY EVENING (Patient taking differently: Inject 20 Units into the skin 2 (two) times daily. ) 45 mL 0  . levalbuterol (XOPENEX) 0.63 MG/3ML nebulizer solution Inhale 3 mLs (0.63 mg total) into the lungs 4 (four) times daily. 3 mL 0  . Lidocaine (SALONPAS PAIN RELIEVING EX) Apply 1 patch topically daily.     Marland Kitchen lidocaine-prilocaine (EMLA) cream Apply a small amount to port a cath site and cover with plastic wrap 1 hour prior to chemotherapy appointments 30 g 3  . metFORMIN (GLUCOPHAGE) 500 MG tablet Take 500 mg by mouth in the morning and at bedtime.    . metoprolol succinate (TOPROL-XL) 100 MG 24 hr tablet Take 100 mg by mouth daily.    . Misc. Devices MISC Please provide patient with an electric wheelchair. 1 each 0  . Multiple Vitamin (MULTIVITAMIN WITH MINERALS) TABS tablet Take 1 tablet by mouth daily.    . potassium chloride (KLOR-CON) 10 MEQ tablet Take 10 mEq by mouth 2 (two) times daily.    . predniSONE (DELTASONE) 20 MG tablet Take 60 mg by mouth daily.     . pregabalin (LYRICA) 150 MG capsule Take 150 mg by mouth in the morning and at bedtime.    Marland Kitchen PROAIR HFA 108 (90 Base) MCG/ACT inhaler INHALE TWO PUFFS BY MOUTH EVERY 4  HOURS AS NEEDED FOR WHEEZING 17 g 0  . prochlorperazine (COMPAZINE) 10 MG tablet Take 1 tablet (10 mg total) by mouth every 6 (six) hours as needed (Nausea or vomiting). 60 tablet 1  . senna-docusate (SENOKOT-S) 8.6-50 MG tablet Take 2 tablets by mouth at bedtime. 60 tablet 0  . spironolactone (ALDACTONE) 25 MG tablet Take 12.5 mg by mouth 2 (two) times daily.     . tamsulosin (FLOMAX) 0.4 MG CAPS capsule Take 1  capsule (0.4 mg total) by mouth 2 (two) times daily after a meal. 180 capsule 1  . tiotropium (SPIRIVA HANDIHALER) 18 MCG inhalation capsule Place 1 capsule (18 mcg total) into inhaler and inhale daily. 30 capsule 12  . traZODone (DESYREL) 50 MG tablet Take 50 mg by mouth at bedtime.  5   No current facility-administered medications for this visit.    ALLERGIES:  No Known Allergies  PHYSICAL EXAM:  Performance status (ECOG): 1 - Symptomatic but completely ambulatory  Vitals:   09/16/19 0825  BP: (!) 133/56  Pulse: 90  Resp: 20  Temp: (!) 97 F (36.1 C)  SpO2: 95%   Wt Readings from Last 3 Encounters:  09/16/19 241 lb 13.5 oz (109.7 kg)  08/26/19 246 lb 14.6 oz (112 kg)  08/05/19 243 lb 13.3 oz (110.6 kg)   Physical Exam Vitals reviewed.  Constitutional:      Appearance: Normal appearance. He is obese.  Cardiovascular:     Rate and Rhythm: Normal rate and regular rhythm.     Pulses: Normal pulses.     Heart sounds: Normal heart sounds.  Pulmonary:     Effort: Pulmonary effort is normal.     Breath sounds: Normal breath sounds.  Chest:     Comments: Port-a-Cath in L chest Musculoskeletal:     Right lower leg: Edema (1+) present.     Left lower leg: Edema (1+) present.  Neurological:     General: No focal deficit present.     Mental Status: He is alert and oriented to person, place, and time.  Psychiatric:        Mood and Affect: Mood normal.        Behavior: Behavior normal.     LABORATORY DATA:  I have reviewed the labs as listed.  CBC Latest Ref Rng & Units 09/16/2019 08/26/2019 08/05/2019  WBC 4.0 - 10.5 K/uL 10.2 8.0 8.0  Hemoglobin 13.0 - 17.0 g/dL 9.7(L) 9.7(L) 10.6(L)  Hematocrit 39 - 52 % 32.1(L) 32.1(L) 34.2(L)  Platelets 150 - 400 K/uL 161 164 146(L)   CMP Latest Ref Rng & Units 09/16/2019 08/26/2019 08/05/2019  Glucose 70 - 99 mg/dL 236(H) 251(H) 393(H)  BUN 8 - 23 mg/dL 13 11 20   Creatinine 0.61 - 1.24 mg/dL 0.90 1.08 1.17  Sodium 135 - 145 mmol/L  134(L) 138 134(L)  Potassium 3.5 - 5.1 mmol/L 3.7 4.3 4.3  Chloride 98 - 111 mmol/L 97(L) 99 93(L)  CO2 22 - 32 mmol/L 28 28 27   Calcium 8.9 - 10.3 mg/dL 8.8(L) 8.4(L) 8.9  Total Protein 6.5 - 8.1 g/dL 6.4(L) 6.1(L) 6.2(L)  Total Bilirubin 0.3 - 1.2 mg/dL 0.6 0.4 0.5  Alkaline Phos 38 - 126 U/L 72 80 69  AST 15 - 41 U/L 27 29 25   ALT 0 - 44 U/L 31 41 40    DIAGNOSTIC IMAGING:  I have independently reviewed the scans and discussed with the patient. No results found.   ASSESSMENT:  1. Recurrent small cell lung cancer: -Treated  for limited stage small cell lung cancer with chemoradiation with cisplatin and etoposide therapy on 06/21/2016 followed by PCI. -CT scan on 04/17/2019 showed worsening right lung nodules. Left lower lobe infectious cavitary lesion was decreased in size. New right middle lobe lung nodule present. -6 cycles of carboplatin, VP-16 and atezolizumab from 05/06/2019 through 08/26/2019. -CT CAP on 07/07/2019 showed improvement in the right lung cancer. Interval development of consolidative change in the left upper lobe consistent with infection/inflammation.  2. Brain metastasis: -MRI of the brain on 05/13/2019 showed small 7 mm ring enhancing lesion at the junction of the left posterior temporal and anterior occipital lobes compatible with solitary metastasis. Typical appearance of lacunar infarct is also considered. -MRI of the brain on 07/31/2019 shows stable 7 mm solitary metastasis in the left occipital temporal junction.   PLAN:  1. Recurrent small cell lung cancer: -He has completed cycle 6 and tolerated very well.  He was not hospitalized presented to the ER after last 2 cycles. -He reports slight worsening of his breathing.  I will make a referral to pulmonology. -I reviewed his labs.  He will proceed with his treatment today. -I plan to repeat CT CAP prior to next visit in 3 weeks.  2. CKD: -Creatinine is better today at 0.9.  3. Diabetes: -Continue  Lantus 25 twice daily and NovoLog sliding scale.  4. Atrial fibrillation: -Continue Eliquis 5 mg twice daily.  No bleeding issues.  5. Low back pain: -Continue hydrocodone 5/325 at bedtime as needed.  6. Brain metastasis: -Plan to do SBRT upon completion of chemotherapy.  He is asymptomatic.  7.  Lower extremity swelling: -He is not taking Lasix.  Continue spironolactone 12.5 mg twice daily.   Orders placed this encounter:  No orders of the defined types were placed in this encounter.    Derek Jack, MD [place] [phone]   I, Milinda Antis, am acting as a scribe for Dr. Sanda Linger.  I, Derek Jack MD, have reviewed the above documentation for accuracy and completeness, and I agree with the above.

## 2019-09-16 NOTE — Progress Notes (Signed)
Patient was assessed by Dr. Delton Coombes and labs have been reviewed. Patient reporting increased difficulty breathing, we will refer him to pulmonologist for follow up on his COPD.  Patient has completed chemotherapy regimen and will only get immunotherapy every 21 days moving forward.  Patient is okay to proceed with treatment today. Primary RN and pharmacy aware.

## 2019-09-16 NOTE — Patient Instructions (Signed)
[place] [facility] Discharge Instructions  You were seen today by Dr. Delton Coombes. He went over your recent results. You received your treatment today. You will be referred to a pulmonologist for your COPD management. You will be scheduled for a CT scan of your chest and abdomen before your next visit. Dr. Delton Coombes will see you back in 3 weeks for labs and follow up.   Thank you for choosing [place] at [facility] to provide your oncology and hematology care.  To afford each patient quality time with our provider, please arrive at least 15 minutes before your scheduled appointment time.   If you have a lab appointment with the [facility] please come in thru the Main Entrance and check in at the main information desk  You need to re-schedule your appointment should you arrive 10 or more minutes late.  We strive to give you quality time with our providers, and arriving late affects you and other patients whose appointments are after yours.  Also, if you no show three or more times for appointments you may be dismissed from the clinic at the providers discretion.     Again, thank you for choosing [facility].  Our hope is that these requests will decrease the amount of time that you wait before being seen by our physicians.       _____________________________________________________________  Should you have questions after your visit to [facility], please contact our office at [phone] between the hours of 8:00 a.m. and 4:30 p.m.  Voicemails left after 4:00 p.m. will not be returned until the following business day.  For prescription refill requests, have your pharmacy contact our office and allow 72 hours.    Cancer Center Support Programs:   > Cancer Support Group  2nd Tuesday of the month 1pm-2pm, Journey Room

## 2019-09-17 ENCOUNTER — Ambulatory Visit (HOSPITAL_COMMUNITY): Payer: Medicare Other

## 2019-09-17 ENCOUNTER — Encounter (HOSPITAL_COMMUNITY): Payer: Self-pay | Admitting: *Deleted

## 2019-09-18 ENCOUNTER — Ambulatory Visit (HOSPITAL_COMMUNITY): Payer: Medicare Other

## 2019-09-21 ENCOUNTER — Ambulatory Visit (HOSPITAL_COMMUNITY): Payer: Medicare Other

## 2019-10-02 ENCOUNTER — Ambulatory Visit (HOSPITAL_COMMUNITY): Admission: RE | Admit: 2019-10-02 | Payer: Medicare Other | Source: Ambulatory Visit

## 2019-10-07 ENCOUNTER — Inpatient Hospital Stay (HOSPITAL_COMMUNITY): Payer: Medicare Other | Attending: Hematology | Admitting: Hematology

## 2019-10-07 ENCOUNTER — Ambulatory Visit (HOSPITAL_COMMUNITY): Payer: Medicare Other

## 2019-10-07 ENCOUNTER — Ambulatory Visit (HOSPITAL_COMMUNITY): Payer: Medicare Other | Admitting: Hematology

## 2019-10-07 ENCOUNTER — Other Ambulatory Visit (HOSPITAL_COMMUNITY): Payer: Medicare Other

## 2019-10-07 ENCOUNTER — Inpatient Hospital Stay (HOSPITAL_COMMUNITY): Payer: Medicare Other

## 2019-10-07 DIAGNOSIS — I4891 Unspecified atrial fibrillation: Secondary | ICD-10-CM | POA: Insufficient documentation

## 2019-10-07 DIAGNOSIS — K5730 Diverticulosis of large intestine without perforation or abscess without bleeding: Secondary | ICD-10-CM | POA: Insufficient documentation

## 2019-10-07 DIAGNOSIS — R0602 Shortness of breath: Secondary | ICD-10-CM | POA: Insufficient documentation

## 2019-10-07 DIAGNOSIS — Z836 Family history of other diseases of the respiratory system: Secondary | ICD-10-CM | POA: Insufficient documentation

## 2019-10-07 DIAGNOSIS — Z7901 Long term (current) use of anticoagulants: Secondary | ICD-10-CM | POA: Insufficient documentation

## 2019-10-07 DIAGNOSIS — R200 Anesthesia of skin: Secondary | ICD-10-CM | POA: Insufficient documentation

## 2019-10-07 DIAGNOSIS — C3411 Malignant neoplasm of upper lobe, right bronchus or lung: Secondary | ICD-10-CM | POA: Insufficient documentation

## 2019-10-07 DIAGNOSIS — Z79899 Other long term (current) drug therapy: Secondary | ICD-10-CM | POA: Insufficient documentation

## 2019-10-07 DIAGNOSIS — Z87891 Personal history of nicotine dependence: Secondary | ICD-10-CM | POA: Insufficient documentation

## 2019-10-07 DIAGNOSIS — R5383 Other fatigue: Secondary | ICD-10-CM | POA: Insufficient documentation

## 2019-10-07 DIAGNOSIS — C7931 Secondary malignant neoplasm of brain: Secondary | ICD-10-CM | POA: Insufficient documentation

## 2019-10-07 DIAGNOSIS — I429 Cardiomyopathy, unspecified: Secondary | ICD-10-CM | POA: Insufficient documentation

## 2019-10-07 DIAGNOSIS — N189 Chronic kidney disease, unspecified: Secondary | ICD-10-CM | POA: Insufficient documentation

## 2019-10-07 DIAGNOSIS — Z803 Family history of malignant neoplasm of breast: Secondary | ICD-10-CM | POA: Insufficient documentation

## 2019-10-07 DIAGNOSIS — R05 Cough: Secondary | ICD-10-CM | POA: Insufficient documentation

## 2019-10-07 DIAGNOSIS — Z833 Family history of diabetes mellitus: Secondary | ICD-10-CM | POA: Insufficient documentation

## 2019-10-07 DIAGNOSIS — D696 Thrombocytopenia, unspecified: Secondary | ICD-10-CM | POA: Insufficient documentation

## 2019-10-07 DIAGNOSIS — Z8249 Family history of ischemic heart disease and other diseases of the circulatory system: Secondary | ICD-10-CM | POA: Insufficient documentation

## 2019-10-07 DIAGNOSIS — Z794 Long term (current) use of insulin: Secondary | ICD-10-CM | POA: Insufficient documentation

## 2019-10-07 DIAGNOSIS — E1122 Type 2 diabetes mellitus with diabetic chronic kidney disease: Secondary | ICD-10-CM | POA: Insufficient documentation

## 2019-10-07 DIAGNOSIS — M7989 Other specified soft tissue disorders: Secondary | ICD-10-CM | POA: Insufficient documentation

## 2019-10-07 DIAGNOSIS — Z87442 Personal history of urinary calculi: Secondary | ICD-10-CM | POA: Insufficient documentation

## 2019-10-07 DIAGNOSIS — Z801 Family history of malignant neoplasm of trachea, bronchus and lung: Secondary | ICD-10-CM | POA: Insufficient documentation

## 2019-10-07 DIAGNOSIS — Z5112 Encounter for antineoplastic immunotherapy: Secondary | ICD-10-CM | POA: Insufficient documentation

## 2019-10-07 DIAGNOSIS — E669 Obesity, unspecified: Secondary | ICD-10-CM | POA: Insufficient documentation

## 2019-10-07 DIAGNOSIS — Z9049 Acquired absence of other specified parts of digestive tract: Secondary | ICD-10-CM | POA: Insufficient documentation

## 2019-10-07 DIAGNOSIS — I2510 Atherosclerotic heart disease of native coronary artery without angina pectoris: Secondary | ICD-10-CM | POA: Insufficient documentation

## 2019-10-07 DIAGNOSIS — I700 Atherosclerosis of aorta: Secondary | ICD-10-CM | POA: Insufficient documentation

## 2019-10-07 DIAGNOSIS — J320 Chronic maxillary sinusitis: Secondary | ICD-10-CM | POA: Insufficient documentation

## 2019-10-07 DIAGNOSIS — M545 Low back pain: Secondary | ICD-10-CM | POA: Insufficient documentation

## 2019-10-13 ENCOUNTER — Inpatient Hospital Stay (HOSPITAL_COMMUNITY): Payer: Medicare Other

## 2019-10-13 ENCOUNTER — Encounter (HOSPITAL_COMMUNITY): Payer: Self-pay | Admitting: Hematology

## 2019-10-13 ENCOUNTER — Inpatient Hospital Stay (HOSPITAL_BASED_OUTPATIENT_CLINIC_OR_DEPARTMENT_OTHER): Payer: Medicare Other | Admitting: Hematology

## 2019-10-13 ENCOUNTER — Other Ambulatory Visit: Payer: Self-pay

## 2019-10-13 VITALS — BP 106/73 | HR 76 | Temp 97.1°F | Resp 18

## 2019-10-13 VITALS — BP 105/62 | HR 82 | Temp 97.3°F | Resp 20 | Wt 244.7 lb

## 2019-10-13 DIAGNOSIS — Z87891 Personal history of nicotine dependence: Secondary | ICD-10-CM | POA: Diagnosis not present

## 2019-10-13 DIAGNOSIS — Z87442 Personal history of urinary calculi: Secondary | ICD-10-CM | POA: Diagnosis not present

## 2019-10-13 DIAGNOSIS — R200 Anesthesia of skin: Secondary | ICD-10-CM | POA: Diagnosis not present

## 2019-10-13 DIAGNOSIS — Z7901 Long term (current) use of anticoagulants: Secondary | ICD-10-CM | POA: Diagnosis not present

## 2019-10-13 DIAGNOSIS — C3411 Malignant neoplasm of upper lobe, right bronchus or lung: Secondary | ICD-10-CM | POA: Diagnosis present

## 2019-10-13 DIAGNOSIS — R0602 Shortness of breath: Secondary | ICD-10-CM | POA: Diagnosis not present

## 2019-10-13 DIAGNOSIS — I429 Cardiomyopathy, unspecified: Secondary | ICD-10-CM | POA: Diagnosis not present

## 2019-10-13 DIAGNOSIS — E669 Obesity, unspecified: Secondary | ICD-10-CM | POA: Diagnosis not present

## 2019-10-13 DIAGNOSIS — R5383 Other fatigue: Secondary | ICD-10-CM | POA: Diagnosis not present

## 2019-10-13 DIAGNOSIS — C3491 Malignant neoplasm of unspecified part of right bronchus or lung: Secondary | ICD-10-CM

## 2019-10-13 DIAGNOSIS — Z5112 Encounter for antineoplastic immunotherapy: Secondary | ICD-10-CM | POA: Diagnosis not present

## 2019-10-13 DIAGNOSIS — K5730 Diverticulosis of large intestine without perforation or abscess without bleeding: Secondary | ICD-10-CM | POA: Diagnosis not present

## 2019-10-13 DIAGNOSIS — M545 Low back pain: Secondary | ICD-10-CM | POA: Diagnosis not present

## 2019-10-13 DIAGNOSIS — E1122 Type 2 diabetes mellitus with diabetic chronic kidney disease: Secondary | ICD-10-CM | POA: Diagnosis not present

## 2019-10-13 DIAGNOSIS — D696 Thrombocytopenia, unspecified: Secondary | ICD-10-CM | POA: Diagnosis not present

## 2019-10-13 DIAGNOSIS — Z79899 Other long term (current) drug therapy: Secondary | ICD-10-CM | POA: Diagnosis not present

## 2019-10-13 DIAGNOSIS — I2510 Atherosclerotic heart disease of native coronary artery without angina pectoris: Secondary | ICD-10-CM | POA: Diagnosis not present

## 2019-10-13 DIAGNOSIS — I700 Atherosclerosis of aorta: Secondary | ICD-10-CM | POA: Diagnosis not present

## 2019-10-13 DIAGNOSIS — N189 Chronic kidney disease, unspecified: Secondary | ICD-10-CM | POA: Diagnosis not present

## 2019-10-13 DIAGNOSIS — J320 Chronic maxillary sinusitis: Secondary | ICD-10-CM | POA: Diagnosis not present

## 2019-10-13 DIAGNOSIS — Z794 Long term (current) use of insulin: Secondary | ICD-10-CM | POA: Diagnosis not present

## 2019-10-13 DIAGNOSIS — R05 Cough: Secondary | ICD-10-CM | POA: Diagnosis not present

## 2019-10-13 DIAGNOSIS — I4891 Unspecified atrial fibrillation: Secondary | ICD-10-CM | POA: Diagnosis not present

## 2019-10-13 DIAGNOSIS — C7931 Secondary malignant neoplasm of brain: Secondary | ICD-10-CM | POA: Diagnosis not present

## 2019-10-13 DIAGNOSIS — M7989 Other specified soft tissue disorders: Secondary | ICD-10-CM | POA: Diagnosis not present

## 2019-10-13 LAB — COMPREHENSIVE METABOLIC PANEL
ALT: 25 U/L (ref 0–44)
AST: 21 U/L (ref 15–41)
Albumin: 3.5 g/dL (ref 3.5–5.0)
Alkaline Phosphatase: 51 U/L (ref 38–126)
Anion gap: 9 (ref 5–15)
BUN: 12 mg/dL (ref 8–23)
CO2: 30 mmol/L (ref 22–32)
Calcium: 9.1 mg/dL (ref 8.9–10.3)
Chloride: 99 mmol/L (ref 98–111)
Creatinine, Ser: 1.01 mg/dL (ref 0.61–1.24)
GFR calc Af Amer: >60 mL/min (ref >60)
GFR calc non Af Amer: >60 mL/min (ref >60)
Glucose, Bld: 161 mg/dL — ABNORMAL HIGH (ref 70–99)
Potassium: 4 mmol/L (ref 3.5–5.1)
Sodium: 138 mmol/L (ref 135–145)
Total Bilirubin: 0.6 mg/dL (ref 0.3–1.2)
Total Protein: 6.6 g/dL (ref 6.5–8.1)

## 2019-10-13 LAB — CBC WITH DIFFERENTIAL/PLATELET
Abs Immature Granulocytes: 0.03 10*3/uL (ref 0.00–0.07)
Basophils Absolute: 0 10*3/uL (ref 0.0–0.1)
Basophils Relative: 1 %
Eosinophils Absolute: 0.5 10*3/uL (ref 0.0–0.5)
Eosinophils Relative: 7 %
HCT: 35.9 % — ABNORMAL LOW (ref 39.0–52.0)
Hemoglobin: 10.8 g/dL — ABNORMAL LOW (ref 13.0–17.0)
Immature Granulocytes: 0 %
Lymphocytes Relative: 7 %
Lymphs Abs: 0.5 10*3/uL — ABNORMAL LOW (ref 0.7–4.0)
MCH: 31.9 pg (ref 26.0–34.0)
MCHC: 30.1 g/dL (ref 30.0–36.0)
MCV: 105.9 fL — ABNORMAL HIGH (ref 80.0–100.0)
Monocytes Absolute: 0.6 10*3/uL (ref 0.1–1.0)
Monocytes Relative: 9 %
Neutro Abs: 5.1 10*3/uL (ref 1.7–7.7)
Neutrophils Relative %: 76 %
Platelets: 146 10*3/uL — ABNORMAL LOW (ref 150–400)
RBC: 3.39 MIL/uL — ABNORMAL LOW (ref 4.22–5.81)
RDW: 14.1 % (ref 11.5–15.5)
WBC: 6.7 10*3/uL (ref 4.0–10.5)
nRBC: 0 % (ref 0.0–0.2)

## 2019-10-13 MED ORDER — SODIUM CHLORIDE 0.9% FLUSH
10.0000 mL | INTRAVENOUS | Status: DC | PRN
  Administered 2019-10-13: 10 mL

## 2019-10-13 MED ORDER — HEPARIN SOD (PORK) LOCK FLUSH 100 UNIT/ML IV SOLN
500.0000 [IU] | Freq: Once | INTRAVENOUS | Status: AC | PRN
  Administered 2019-10-13: 500 [IU]

## 2019-10-13 MED ORDER — SODIUM CHLORIDE 0.9 % IV SOLN
1200.0000 mg | Freq: Once | INTRAVENOUS | Status: AC
  Administered 2019-10-13: 1200 mg via INTRAVENOUS
  Filled 2019-10-13: qty 20

## 2019-10-13 MED ORDER — SODIUM CHLORIDE 0.9 % IV SOLN: Freq: Once | INTRAVENOUS | Status: AC

## 2019-10-13 NOTE — Patient Instructions (Signed)
[facility] at [facility] Discharge Instructions  Labs drawn from portacath today   Thank you for choosing [facility] at [facility] to provide your oncology and hematology care.  To afford each patient quality time with our provider, please arrive at least 15 minutes before your scheduled appointment time.   If you have a lab appointment with the Cancer Center please come in thru the Main Entrance and check in at the main information desk.  You need to re-schedule your appointment should you arrive 10 or more minutes late.  We strive to give you quality time with our providers, and arriving late affects you and other patients whose appointments are after yours.  Also, if you no show three or more times for appointments you may be dismissed from the clinic at the providers discretion.     Again, thank you for choosing [facility].  Our hope is that these requests will decrease the amount of time that you wait before being seen by our physicians.       _____________________________________________________________  Should you have questions after your visit to  Cancer Center, please contact our office at [phone] and follow the prompts.  Our office hours are 8:00 a.m. and 4:30 p.m. Monday - Friday.  Please note that voicemails left after 4:00 p.m. may not be returned until the following business day.  We are closed weekends and major holidays.  You do have access to a nurse 24-7, just call the main number to the clinic [phone] and do not press any options, hold on the line and a nurse will answer the phone.    For prescription refill requests, have your pharmacy contact our office and allow 72 hours.    Due to Covid, you will need to wear a mask upon entering the hospital. If you do not have a mask, a mask will be given to you at the Main Entrance upon arrival. For doctor visits, patients may have 1 support person age 18  or older with them. For treatment visits, patients can not have anyone with them due to social distancing guidelines and our immunocompromised population.     

## 2019-10-13 NOTE — Progress Notes (Signed)
Chase Herrera, [place] [address]   CLINIC:  Medical Oncology/Hematology  PCP:  [facility] / [place] [address] [phone]   REASON FOR VISIT:  Follow-up for right small cell lung cancer  PRIOR THERAPY: Carboplatin, VP-16 and atezolizumab x 3 cycles from 05/06/2019 to 08/28/2019  NGS Results: Not done  CURRENT THERAPY: Atezolizumab ever 3 weeks  BRIEF ONCOLOGIC HISTORY:  Oncology History  Small cell lung cancer ([place])  04/03/2016 Initial Diagnosis   Small cell lung cancer ([place])   04/09/2016 Imaging   MRI brain- Negative for metastatic disease to the brain. No acute abnormality.   04/09/2016 Procedure   Right IJ port catheter placement by IR   04/13/2016 PET scan   . The right apical lung mass has significantly enlarged and there is new right paratracheal adenopathy as well as a hypermetabolic new lymph node in the right inner infraclavicular fossa potentially with early impingement on adjacent neurovascular structures. 2. Other imaging findings of potential clinical significance: Chronic bilateral maxillary sinusitis. Left carotid atherosclerotic calcification. Aortoiliac atherosclerotic vascular disease. Coronary atherosclerosis. Paraseptal emphysema. Mild atelectasis in the lung bases. Scattered sigmoid colon diverticula. Probable right pars defect at L5.   05/09/2016 - 07/12/2016 Radiation Therapy   Radiotherapy to right Lung+MS 63 Gy/35 fractions at 1.8 GY/f using 6 x photons 3- D CRT technique Dates: 4/18-6/21/18       06/19/2016 Treatment Plan Change   Cisplatin dose reduced by 20% for cycle #4 due to severe thrombocytopenia following cycle #3 (13,000).   08/21/2016 PET scan   1. Marked reduction in size and metabolic activity of RIGHT upper lobe mass. 2. Resolution of mediastinal and RIGHT supraclavicular lymphadenopathy. 3. No evidence of disease progression.   05/06/2019 -   Chemotherapy   The patient had palonosetron (ALOXI) injection 0.25 mg, 0.25 mg, Intravenous,  Once, 6 of 6 cycles Administration: 0.25 mg (05/06/2019), 0.25 mg (05/27/2019), 0.25 mg (06/17/2019), 0.25 mg (07/15/2019), 0.25 mg (08/05/2019), 0.25 mg (08/26/2019) pegfilgrastim-cbqv (UDENYCA) injection 6 mg, 6 mg, Subcutaneous, Once, 6 of 6 cycles Administration: 6 mg (05/11/2019), 6 mg (06/01/2019), 6 mg (06/23/2019), 6 mg (07/20/2019), 6 mg (08/10/2019), 6 mg (08/31/2019) CARBOplatin (PARAPLATIN) 450 mg in sodium chloride 0.9 % 250 mL chemo infusion, 450 mg (100 % of original dose 446.4 mg), Intravenous,  Once, 6 of 6 cycles Dose modification:   (original dose 446.4 mg, Cycle 1, Reason: Provider Judgment),   (original dose 500 mg, Cycle 2, Reason: Provider Judgment), 500 mg (original dose 500 mg, Cycle 2, Reason: Provider Judgment), 500 mg (original dose 500 mg, Cycle 3),   (original dose 527.6 mg, Cycle 4, Reason: Provider Judgment) Administration: 450 mg (05/06/2019), 500 mg (05/27/2019), 500 mg (06/17/2019), 540 mg (07/15/2019), 510 mg (08/05/2019), 540 mg (08/26/2019) etoposide (VEPESID) 190 mg in sodium chloride 0.9 % 500 mL chemo infusion, 80 mg/m2 = 190 mg (80 % of original dose 100 mg/m2), Intravenous,  Once, 6 of 6 cycles Dose modification: 80 mg/m2 (80 % of original dose 100 mg/m2, Cycle 1, Reason: Provider Judgment), 80 mg/m2 (original dose 100 mg/m2, Cycle 1, Reason: Provider Judgment), 80 mg/m2 (80 % of original dose 100 mg/m2, Cycle 2, Reason: Provider Judgment) Administration: 190 mg (05/06/2019), 190 mg (05/07/2019), 190 mg (05/08/2019), 190 mg (05/27/2019), 190 mg (05/28/2019), 190 mg (05/29/2019), 190 mg (06/17/2019), 190 mg (06/18/2019), 190 mg (06/19/2019), 190 mg (07/15/2019), 190 mg (07/16/2019), 190 mg (07/17/2019), 190 mg (08/05/2019), 190 mg (08/06/2019), 190  mg (08/07/2019), 190 mg (08/26/2019), 190 mg (08/27/2019), 190 mg (08/28/2019) fosaprepitant (EMEND) 150 mg in sodium chloride 0.9 % 145 mL IVPB, 150 mg, Intravenous,  Once,  6 of 6 cycles Administration: 150 mg (05/06/2019), 150 mg (05/27/2019), 150 mg (06/17/2019), 150 mg (07/15/2019), 150 mg (08/05/2019), 150 mg (08/26/2019) atezolizumab (TECENTRIQ) 1,200 mg in sodium chloride 0.9 % 250 mL chemo infusion, 1,200 mg, Intravenous, Once, 7 of 10 cycles Administration: 1,200 mg (05/06/2019), 1,200 mg (05/27/2019), 1,200 mg (06/17/2019), 1,200 mg (07/15/2019), 1,200 mg (09/16/2019), 1,200 mg (08/05/2019), 1,200 mg (08/26/2019)  for chemotherapy treatment.      CANCER STAGING: Cancer Staging Small cell lung cancer ([place]) Staging form: Lung, AJCC 8th Edition - Clinical: Stage IIIC (cT3, cN3, cM0) - Signed by Twana First, MD on 04/16/2016   INTERVAL HISTORY:  [name], a 69 y.o. male, returns for routine follow-up and consideration for next cycle of chemotherapy. Sheri was last seen on 09/16/2019.  Due for cycle #8 of atezolizumab today.   Overall, he tells me he has been feeling good. He tolerated the previous treatment well and denies having diarrhea or rashes. His productive cough and SOB are stable. He is taking 20 units of Lantus BID. His appetite is good.  Overall, he feels ready for next cycle of chemo today.    REVIEW OF SYSTEMS:  Review of Systems  Constitutional: Positive for fatigue (moderate). Negative for appetite change.  Respiratory: Positive for cough (clear white sputum) and shortness of breath.   Cardiovascular: Positive for leg swelling (feet & leg swelling).  Gastrointestinal: Negative for diarrhea.  Skin: Negative for rash.  Neurological: Positive for numbness (& tingling in hands & feet).  All other systems reviewed and are negative.   PAST MEDICAL/SURGICAL HISTORY:  Past Medical History:  Diagnosis Date  . Alcohol abuse    Quit 08/23/11  . Arthritis   . Atrial fibrillation ([place])   . Atrial flutter ([place])    CTI ablation by Dr [name] 10/2011  . Bacteremia due to Gram-positive bacteria 11/29/2016  . CHF (congestive heart failure) ([place])   .  COPD (chronic obstructive pulmonary disease) ([place])   . Depression   . Essential hypertension   . History of cardiomyopathy    LVEF 25-30% 08/2011 with subsequent normalization  . History of kidney stones   . Obesity   . Prostate enlargement   . Small cell lung cancer ([place])    Right upper lobe - follows with [facility]  . Type 2 diabetes mellitus ([place])    Past Surgical History:  Procedure Laterality Date  . APPENDECTOMY    . ATRIAL ABLATION SURGERY  11/06/2011   CTI ablation for atrial flutter by Dr [name]  . ATRIAL FLUTTER ABLATION N/A 11/06/2011   Procedure: ATRIAL FLUTTER ABLATION;  Surgeon: Thompson Grayer, MD;  Location: [facility] CATH LAB;  Service: Cardiovascular;  Laterality: N/A;  . Cataracts    . COLONOSCOPY WITH PROPOFOL N/A 06/06/2017   Procedure: COLONOSCOPY WITH PROPOFOL;  Surgeon: [name], MD;  Location: [facility] ENDO SUITE;  Service: Endoscopy;  Laterality: N/A;  1:15pm  . IR GENERIC HISTORICAL  04/09/2016   IR US GUIDE VASC ACCESS RIGHT 04/09/2016 [name], DO [facility]-INTERV RAD  . IR GENERIC HISTORICAL  04/09/2016   IR FLUORO GUIDE PORT INSERTION RIGHT 04/09/2016 [name], DO [facility]-INTERV RAD  . PORT-A-CATH REMOVAL N/A 04/24/2017   Procedure: MINOR REMOVAL PORT-A-CATH;  Surgeon: [name], MD;  Location: [facility] ORS;  Service: General;  Laterality:  N/A;  . PORTACATH PLACEMENT Left 04/22/2019   Procedure: INSERTION PORT-A-CATH;  Surgeon: Aviva Signs, MD;  Location: [facility] ORS;  Service: General;  Laterality: Left;  . TONSILLECTOMY      SOCIAL HISTORY:  Social History   Socioeconomic History  . Marital status: Single    Spouse name: Not on file  . Number of children: Not on file  . Years of education: Not on file  . Highest education level: Not on file  Occupational History  . Occupation: retired     Comment: Building services engineer  Tobacco Use  . Smoking status: Former Smoker    Packs/day: 0.50    Years: 47.00    Pack years: 23.50    Types: Cigarettes    Quit date:  05/30/2012    Years since quitting: 7.3  . Smokeless tobacco: Former Systems developer    Quit date: 04/10/2014  . Tobacco comment: encouraged to quit today 03/04/12  Vaping Use  . Vaping Use: Never used  Substance and Sexual Activity  . Alcohol use: No    Comment: quit 3 1/2 years ago  . Drug use: Not Currently    Comment: marajuana occasionally  . Sexual activity: Not Currently  Other Topics Concern  . Not on file  Social History Narrative   Lives in [place] alone.   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 Difficulty of Paying Living Expenses: Not on file  Food Insecurity:   . Worried About Charity fundraiser in the Last Year: Not on file  . Ran Out of Food in the Last Year: Not on file  Transportation Needs:   . Lack of Transportation (Medical): Not on file  . Lack of Transportation (Non-Medical): Not on file  Physical Activity:   . Days of Exercise per Week: Not on file  . Minutes of Exercise per Session: Not on file  Stress:   . Feeling of Stress : Not on file  Social Connections:   . Frequency of Communication with Friends and Family: Not on file  . Frequency of Social Gatherings with Friends and Family: Not on file  . Attends Religious Services: Not on file  . Active Member of Clubs or Organizations: Not on file  . Attends Archivist Meetings: Not on file  . Marital Status: Not on file  Intimate Partner Violence:   . Fear of Current or Ex-Partner: Not on file  . Emotionally Abused: Not on file  . Physically Abused: Not on file  . Sexually Abused: Not on file    FAMILY HISTORY:  Family History  Problem Relation Age of Onset  . Diabetes Sister   . Hypertension Mother   . Lung cancer Mother   . Hypertension Father   . Heart attack Father   . Hypertension Sister   . Hypertension Sister   . Hypertension Sister   . Pulmonary fibrosis Sister   . Breast cancer Cousin     . Colon cancer Neg Hx     CURRENT MEDICATIONS:  Current Outpatient Medications  Medication Sig Dispense Refill  . acetaminophen (TYLENOL) 500 MG tablet Take 1,000 mg by mouth every 6 (six) hours as needed for moderate pain or headache.     Marland Kitchen apixaban (ELIQUIS) 5 MG TABS tablet Take 5 mg by mouth 2 (two) times daily.     Marland Kitchen AQUALANCE LANCETS 30G MISC USE TO check  blood glucose twice daily  99  . Artificial Saliva (BIOTENE DRY MOUTH MOISTURIZING) SOLN Apply 1 Dose topically as needed (dry mouth).     Marland Kitchen atorvastatin (LIPITOR) 20 MG tablet Take 1 tablet (20 mg total) by mouth daily. 90 tablet 3  . benzonatate (TESSALON) 100 MG capsule Take 200 mg by mouth every 8 (eight) hours as needed.    . Carboxymethylcellul-Glycerin (LUBRICATING EYE DROPS OP) Place 1 drop into the left eye daily as needed (dry eyes).     Marland Kitchen diltiazem (DILACOR XR) 240 MG 24 hr capsule Take 240 mg by mouth daily.    Marland Kitchen dofetilide (TIKOSYN) 250 MCG capsule Take 1 capsule (250 mcg total) by mouth 2 (two) times daily.    . fluticasone (FLONASE) 50 MCG/ACT nasal spray Place 2 sprays into both nostrils daily. 16 g 6  . Fluticasone-Umeclidin-Vilant (TRELEGY ELLIPTA) 100-62.5-25 MCG/INH AEPB Inhale 1 puff into the lungs daily.    . furosemide (LASIX) 40 MG tablet TAKE 1 TABLET BY MOUTH EVERY DAY 90 tablet 0  . gabapentin (NEURONTIN) 300 MG capsule Take 600 mg by mouth 2 (two) times daily.     Marland Kitchen GLOBAL EASE INJECT PEN NEEDLES 31G X 8 MM MISC Inject into the skin 3 (three) times daily.    Marland Kitchen guaifenesin (ROBITUSSIN) 100 MG/5ML syrup Take 200 mg by mouth at bedtime as needed for cough.     Marland Kitchen HM SENNA 8.6 MG tablet Take 2 tablets by mouth at bedtime.    Marland Kitchen HYDROcodone-acetaminophen (NORCO/VICODIN) 5-325 MG tablet Take 1 tablet by mouth at bedtime as needed. 30 tablet 0  . hydrOXYzine (VISTARIL) 25 MG capsule Take 25 mg by mouth every 8 (eight) hours.    . insulin lispro (HUMALOG) 100 UNIT/ML injection Inject 2-20 Units into the skin 3  (three) times daily with meals. Sliding scale     . Insulin Syringe-Needle U-100 28G X 1/2" 1 ML MISC 1 each by Does not apply route 3 (three) times daily. 100 each 3  . ipratropium-albuterol (DUONEB) 0.5-2.5 (3) MG/3ML SOLN Take 3 mLs by nebulization every 4 (four) hours as needed (shortness of breath).     Elmore Guise Devices (ADJUSTABLE LANCING DEVICE) MISC TO check blood glucose daily    . LANTUS SOLOSTAR 100 UNIT/ML Solostar Pen INJECT 20 UNITS UNDER THE SKIN EVERY MORNING AND INJECT 20 UNITS UNDER THE SKIN EVERY EVENING (Patient taking differently: Inject 20 Units into the skin 2 (two) times daily. ) 45 mL 0  . levalbuterol (XOPENEX) 0.63 MG/3ML nebulizer solution Inhale 3 mLs (0.63 mg total) into the lungs 4 (four) times daily. 3 mL 0  . Lidocaine (SALONPAS PAIN RELIEVING EX) Apply 1 patch topically daily.     Marland Kitchen lidocaine-prilocaine (EMLA) cream Apply a small amount to port a cath site and cover with plastic wrap 1 hour prior to chemotherapy appointments 30 g 3  . metFORMIN (GLUCOPHAGE) 500 MG tablet Take 500 mg by mouth in the morning and at bedtime.    . metoprolol succinate (TOPROL-XL) 100 MG 24 hr tablet Take 100 mg by mouth daily.    . Misc. Devices MISC Please provide patient with an electric wheelchair. 1 each 0  . Multiple Vitamin (MULTIVITAMIN WITH MINERALS) TABS tablet Take 1 tablet by mouth daily.    . potassium chloride (KLOR-CON) 10 MEQ tablet Take 10 mEq by mouth 2 (two) times daily.    . pregabalin (LYRICA) 150 MG capsule Take 150 mg by mouth in the morning and at bedtime.    Marland Kitchen  PROAIR HFA 108 (90 Base) MCG/ACT inhaler INHALE TWO PUFFS BY MOUTH EVERY 4 HOURS AS NEEDED FOR WHEEZING 17 g 0  . prochlorperazine (COMPAZINE) 10 MG tablet Take 1 tablet (10 mg total) by mouth every 6 (six) hours as needed (Nausea or vomiting). 60 tablet 1  . senna-docusate (SENOKOT-S) 8.6-50 MG tablet Take 2 tablets by mouth at bedtime. 60 tablet 0  . spironolactone (ALDACTONE) 25 MG tablet Take 12.5 mg by  mouth 2 (two) times daily.     . tamsulosin (FLOMAX) 0.4 MG CAPS capsule Take 1 capsule (0.4 mg total) by mouth 2 (two) times daily after a meal. 180 capsule 1  . tiotropium (SPIRIVA HANDIHALER) 18 MCG inhalation capsule Place 1 capsule (18 mcg total) into inhaler and inhale daily. 30 capsule 12  . traZODone (DESYREL) 50 MG tablet Take 50 mg by mouth at bedtime.  5   No current facility-administered medications for this visit.    ALLERGIES:  No Known Allergies  PHYSICAL EXAM:  Performance status (ECOG): 1 - Symptomatic but completely ambulatory  Vitals:   10/13/19 0902  BP: 105/62  Pulse: 82  Resp: 20  Temp: (!) 97.3 F (36.3 C)  SpO2: 95%   Wt Readings from Last 3 Encounters:  10/13/19 244 lb 11.4 oz (111 kg)  09/16/19 241 lb 13.5 oz (109.7 kg)  08/26/19 246 lb 14.6 oz (112 kg)   Physical Exam Vitals reviewed.  Constitutional:      Appearance: Normal appearance. He is obese.  Cardiovascular:     Rate and Rhythm: Normal rate and regular rhythm.     Pulses: Normal pulses.     Heart sounds: Normal heart sounds.  Pulmonary:     Effort: Pulmonary effort is normal.     Breath sounds: Normal breath sounds.  Chest:     Comments: Port-a-Cath in L chest Neurological:     General: No focal deficit present.     Mental Status: He is alert and oriented to person, place, and time.  Psychiatric:        Mood and Affect: Mood normal.        Behavior: Behavior normal.     LABORATORY DATA:  I have reviewed the labs as listed.  CBC Latest Ref Rng & Units 10/13/2019 09/16/2019 08/26/2019  WBC 4.0 - 10.5 K/uL 6.7 10.2 8.0  Hemoglobin 13.0 - 17.0 g/dL 10.8(L) 9.7(L) 9.7(L)  Hematocrit 39 - 52 % 35.9(L) 32.1(L) 32.1(L)  Platelets 150 - 400 K/uL 146(L) 161 164   CMP Latest Ref Rng & Units 10/13/2019 09/16/2019 08/26/2019  Glucose 70 - 99 mg/dL 161(H) 236(H) 251(H)  BUN 8 - 23 mg/dL 12 13 11   Creatinine 0.61 - 1.24 mg/dL 1.01 0.90 1.08  Sodium 135 - 145 mmol/L 138 134(L) 138  Potassium  3.5 - 5.1 mmol/L 4.0 3.7 4.3  Chloride 98 - 111 mmol/L 99 97(L) 99  CO2 22 - 32 mmol/L 30 28 28   Calcium 8.9 - 10.3 mg/dL 9.1 8.8(L) 8.4(L)  Total Protein 6.5 - 8.1 g/dL 6.6 6.4(L) 6.1(L)  Total Bilirubin 0.3 - 1.2 mg/dL 0.6 0.6 0.4  Alkaline Phos 38 - 126 U/L 51 72 80  AST 15 - 41 U/L 21 27 29   ALT 0 - 44 U/L 25 31 41    DIAGNOSTIC IMAGING:  I have independently reviewed the scans and discussed with the patient. No results found.   ASSESSMENT:  1. Recurrent small cell lung cancer: -Treated for limited stage small cell lung cancer with chemoradiation  with cisplatin and etoposide therapy on 06/21/2016 followed by PCI. -CT scan on 04/17/2019 showed worsening right lung nodules. Left lower lobe infectious cavitary lesion was decreased in size. New right middle lobe lung nodule present. -6 cycles of carboplatin, VP-16 and atezolizumab from 05/06/2019 through 08/26/2019. -CT CAP on 07/07/2019 showed improvement in the right lung cancer. Interval development of consolidative change in the left upper lobe consistent with infection/inflammation.  2. Brain metastasis: -MRI of the brain on 05/13/2019 showed small 7 mm ring enhancing lesion at the junction of the left posterior temporal and anterior occipital lobes compatible with solitary metastasis. Typical appearance of lacunar infarct is also considered. -MRI of the brain on 07/31/2019 shows stable 7 mm solitary metastasis in the left occipital temporal junction.   PLAN:  1. Recurrent small cell lung cancer: -He has missed his appointment last week as well as CT scan. -He does not have any immunotherapy related side effects.  LFTs are normal.  CBC shows mildly decreased platelet count of 146.  Last TSH was 3.3. -He will proceed with his treatment today.  I plan to see him back in 3 weeks with repeat scan.    2. CKD: -Creatinine is 1.01.  3. Diabetes: -Continue Lantus 20 units twice daily and NovoLog sliding scale.  4. Atrial  fibrillation: -Continue Eliquis 5 mg twice daily.  5. Low back pain: -Continue hydrocodone 5/325 at bedtime as needed.  6. Brain metastasis: -Plan to repeat MRI prior to next visit.  If the brain lesion is still there, will consider SBRT.  7. Lower extremity swelling: -Continue spironolactone 1 and half tablet daily.   Orders placed this encounter:  No orders of the defined types were placed in this encounter.    Derek Jack, MD [place] [phone]   I, Milinda Antis, am acting as a scribe for Dr. Sanda Linger.  I, Derek Jack MD, have reviewed the above documentation for accuracy and completeness, and I agree with the above.

## 2019-10-13 NOTE — Progress Notes (Signed)
Chase Herrera presents today for D1C8 Tecentriq. Pt denies any new changes or symptoms since last treatment. Lab results and vitals have been reviewed and are stable and within parameters for treatment. Patient has been assessed by Dr. Delton Coombes who has approved proceeding with treatment today as planned.  Infusions tolerated without incident or complaint. VSS upon completion of treatment. Port flushed and deaccessed per protocol, see MAR and IV flowsheet for details. Discharged in satisfactory condition with follow up instructions.

## 2019-10-13 NOTE — Progress Notes (Signed)
Patient was assessed by Dr. Katragadda and labs have been reviewed.  Patient is okay to proceed with treatment today. Primary RN and pharmacy aware.   

## 2019-10-13 NOTE — Patient Instructions (Signed)
[facility] at [facility] Discharge Instructions  You were seen today by Dr. Katragadda. He went over your recent results. You received your treatment today. Dr. Katragadda will see you back in 3 weeks for labs and follow up.   Thank you for choosing [facility] at [facility] to provide your oncology and hematology care.  To afford each patient quality time with our provider, please arrive at least 15 minutes before your scheduled appointment time.   If you have a lab appointment with the Cancer Center please come in thru the Main Entrance and check in at the main information desk  You need to re-schedule your appointment should you arrive 10 or more minutes late.  We strive to give you quality time with our providers, and arriving late affects you and other patients whose appointments are after yours.  Also, if you no show three or more times for appointments you may be dismissed from the clinic at the providers discretion.     Again, thank you for choosing [facility].  Our hope is that these requests will decrease the amount of time that you wait before being seen by our physicians.       _____________________________________________________________  Should you have questions after your visit to [facility], please contact our office at [phone] between the hours of 8:00 a.m. and 4:30 p.m.  Voicemails left after 4:00 p.m. will not be returned until the following business day.  For prescription refill requests, have your pharmacy contact our office and allow 72 hours.    Cancer Center Support Programs:   > Cancer Support Group  2nd Tuesday of the month 1pm-2pm, Journey Room    

## 2019-10-13 NOTE — Patient Instructions (Signed)
[facility] Discharge Instructions for Patients Receiving Chemotherapy   Beginning January 23rd 2017 lab work for the [facility] will be done in the  Main lab at [facility] on 1st floor. If you have a lab appointment with the [facility] please come in thru the  Main Entrance and check in at the main information desk   Today you received the following chemotherapy agents Tecentriq  To help prevent nausea and vomiting after your treatment, we encourage you to take your nausea medication  If you develop nausea and vomiting, or diarrhea that is not controlled by your medication, call the clinic.  The clinic phone number is [phone]. Office hours are Monday-Friday 8:30am-5:00pm.  BELOW ARE SYMPTOMS THAT SHOULD BE REPORTED IMMEDIATELY:  *FEVER GREATER THAN 101.0 F  *CHILLS WITH OR WITHOUT FEVER  NAUSEA AND VOMITING THAT IS NOT CONTROLLED WITH YOUR NAUSEA MEDICATION  *UNUSUAL SHORTNESS OF BREATH  *UNUSUAL BRUISING OR BLEEDING  TENDERNESS IN MOUTH AND THROAT WITH OR WITHOUT PRESENCE OF ULCERS  *URINARY PROBLEMS  *BOWEL PROBLEMS  UNUSUAL RASH Items with * indicate a potential emergency and should be followed up as soon as possible. If you have an emergency after office hours please contact your primary care physician or go to the nearest emergency department.  Please call the clinic during office hours if you have any questions or concerns.   You may also contact the Patient Navigator at [phone] should you have any questions or need assistance in obtaining follow up care.      Resources For Cancer Patients and their Caregivers ? American Cancer Society: Can assist with transportation, wigs, general needs, runs Look Good Feel Better.        (660) 396-3915 ? Cancer Care: Provides financial assistance, online support groups, medication/co-pay assistance.  1-800-813-HOPE (574)471-8573) ? [facility] Assists [place] cancer  patients and their families through emotional , educational and financial support.  609-101-0091 ? [place] DSS Where to apply for food stamps, Medicaid and utility assistance. (754) 012-5417 ? [facility]: Transportation to medical appointments. 934-310-3851 ? Social Security Administration: May apply for disability if have a Stage IV cancer. ([phone] 127-9507 478-362-7923 ? [employer], Disability and Transit Services: Assists with nutrition, care and transit needs. [phone]

## 2019-10-15 ENCOUNTER — Other Ambulatory Visit (HOSPITAL_COMMUNITY): Payer: Self-pay | Admitting: *Deleted

## 2019-10-15 DIAGNOSIS — C3491 Malignant neoplasm of unspecified part of right bronchus or lung: Secondary | ICD-10-CM

## 2019-10-15 NOTE — Progress Notes (Signed)
Orders placed for MR brain w wo contrast per Dr. Delton Coombes

## 2019-10-21 ENCOUNTER — Ambulatory Visit (HOSPITAL_COMMUNITY)
Admission: RE | Admit: 2019-10-21 | Discharge: 2019-10-21 | Disposition: A | Discharge status: Home / Self Care | Payer: Medicare Other | Source: Ambulatory Visit | Attending: Hematology | Admitting: Hematology

## 2019-10-21 ENCOUNTER — Other Ambulatory Visit: Payer: Self-pay

## 2019-10-21 DIAGNOSIS — C3491 Malignant neoplasm of unspecified part of right bronchus or lung: Secondary | ICD-10-CM | POA: Insufficient documentation

## 2019-10-21 MED ORDER — IOHEXOL 300 MG/ML  SOLN
100.0000 mL | Freq: Once | INTRAMUSCULAR | Status: AC | PRN
  Administered 2019-10-21: 100 mL via INTRAVENOUS

## 2019-10-28 ENCOUNTER — Ambulatory Visit (HOSPITAL_COMMUNITY): Payer: Medicare Other | Admitting: Hematology

## 2019-10-28 ENCOUNTER — Other Ambulatory Visit (HOSPITAL_COMMUNITY): Payer: Medicare Other

## 2019-10-28 ENCOUNTER — Ambulatory Visit (HOSPITAL_COMMUNITY)
Admission: RE | Admit: 2019-10-28 | Discharge: 2019-10-28 | Disposition: A | Discharge status: Home / Self Care | Payer: Medicare Other | Source: Ambulatory Visit | Attending: Hematology | Admitting: Hematology

## 2019-10-28 ENCOUNTER — Ambulatory Visit (HOSPITAL_COMMUNITY): Payer: Medicare Other

## 2019-10-28 DIAGNOSIS — C3491 Malignant neoplasm of unspecified part of right bronchus or lung: Secondary | ICD-10-CM | POA: Diagnosis present

## 2019-10-28 MED ORDER — GADOBUTROL 1 MMOL/ML IV SOLN
10.0000 mL | Freq: Once | INTRAVENOUS | Status: AC | PRN
  Administered 2019-10-28: 10 mL via INTRAVENOUS

## 2019-11-03 ENCOUNTER — Inpatient Hospital Stay (HOSPITAL_COMMUNITY): Payer: Medicare Other | Admitting: Hematology

## 2019-11-03 ENCOUNTER — Inpatient Hospital Stay (HOSPITAL_COMMUNITY): Payer: Medicare Other | Attending: Hematology

## 2019-11-03 ENCOUNTER — Inpatient Hospital Stay (HOSPITAL_COMMUNITY): Payer: Medicare Other

## 2019-11-03 DIAGNOSIS — R5383 Other fatigue: Secondary | ICD-10-CM | POA: Insufficient documentation

## 2019-11-03 DIAGNOSIS — J449 Chronic obstructive pulmonary disease, unspecified: Secondary | ICD-10-CM | POA: Insufficient documentation

## 2019-11-03 DIAGNOSIS — Z87891 Personal history of nicotine dependence: Secondary | ICD-10-CM | POA: Insufficient documentation

## 2019-11-03 DIAGNOSIS — R609 Edema, unspecified: Secondary | ICD-10-CM | POA: Insufficient documentation

## 2019-11-03 DIAGNOSIS — I700 Atherosclerosis of aorta: Secondary | ICD-10-CM | POA: Insufficient documentation

## 2019-11-03 DIAGNOSIS — I4891 Unspecified atrial fibrillation: Secondary | ICD-10-CM | POA: Insufficient documentation

## 2019-11-03 DIAGNOSIS — R059 Cough, unspecified: Secondary | ICD-10-CM | POA: Insufficient documentation

## 2019-11-03 DIAGNOSIS — K5730 Diverticulosis of large intestine without perforation or abscess without bleeding: Secondary | ICD-10-CM | POA: Insufficient documentation

## 2019-11-03 DIAGNOSIS — Z803 Family history of malignant neoplasm of breast: Secondary | ICD-10-CM | POA: Insufficient documentation

## 2019-11-03 DIAGNOSIS — I428 Other cardiomyopathies: Secondary | ICD-10-CM | POA: Insufficient documentation

## 2019-11-03 DIAGNOSIS — E1122 Type 2 diabetes mellitus with diabetic chronic kidney disease: Secondary | ICD-10-CM | POA: Insufficient documentation

## 2019-11-03 DIAGNOSIS — M545 Low back pain, unspecified: Secondary | ICD-10-CM | POA: Insufficient documentation

## 2019-11-03 DIAGNOSIS — Z801 Family history of malignant neoplasm of trachea, bronchus and lung: Secondary | ICD-10-CM | POA: Insufficient documentation

## 2019-11-03 DIAGNOSIS — Z87442 Personal history of urinary calculi: Secondary | ICD-10-CM | POA: Insufficient documentation

## 2019-11-03 DIAGNOSIS — Z79899 Other long term (current) drug therapy: Secondary | ICD-10-CM | POA: Insufficient documentation

## 2019-11-03 DIAGNOSIS — Z9049 Acquired absence of other specified parts of digestive tract: Secondary | ICD-10-CM | POA: Insufficient documentation

## 2019-11-03 DIAGNOSIS — C3411 Malignant neoplasm of upper lobe, right bronchus or lung: Secondary | ICD-10-CM | POA: Insufficient documentation

## 2019-11-03 DIAGNOSIS — N189 Chronic kidney disease, unspecified: Secondary | ICD-10-CM | POA: Insufficient documentation

## 2019-11-03 DIAGNOSIS — Z833 Family history of diabetes mellitus: Secondary | ICD-10-CM | POA: Insufficient documentation

## 2019-11-03 DIAGNOSIS — E669 Obesity, unspecified: Secondary | ICD-10-CM | POA: Insufficient documentation

## 2019-11-03 DIAGNOSIS — R200 Anesthesia of skin: Secondary | ICD-10-CM | POA: Insufficient documentation

## 2019-11-03 DIAGNOSIS — Z7901 Long term (current) use of anticoagulants: Secondary | ICD-10-CM | POA: Insufficient documentation

## 2019-11-03 DIAGNOSIS — Z8249 Family history of ischemic heart disease and other diseases of the circulatory system: Secondary | ICD-10-CM | POA: Insufficient documentation

## 2019-11-03 DIAGNOSIS — E1169 Type 2 diabetes mellitus with other specified complication: Secondary | ICD-10-CM | POA: Insufficient documentation

## 2019-11-03 DIAGNOSIS — Z836 Family history of other diseases of the respiratory system: Secondary | ICD-10-CM | POA: Insufficient documentation

## 2019-11-03 DIAGNOSIS — Z5112 Encounter for antineoplastic immunotherapy: Secondary | ICD-10-CM | POA: Insufficient documentation

## 2019-11-03 DIAGNOSIS — C7931 Secondary malignant neoplasm of brain: Secondary | ICD-10-CM | POA: Insufficient documentation

## 2019-11-03 DIAGNOSIS — J341 Cyst and mucocele of nose and nasal sinus: Secondary | ICD-10-CM | POA: Insufficient documentation

## 2019-11-03 DIAGNOSIS — M7989 Other specified soft tissue disorders: Secondary | ICD-10-CM | POA: Insufficient documentation

## 2019-11-18 ENCOUNTER — Encounter: Payer: Self-pay | Admitting: Pulmonary Disease

## 2019-11-18 ENCOUNTER — Ambulatory Visit (INDEPENDENT_AMBULATORY_CARE_PROVIDER_SITE_OTHER): Payer: Medicare Other | Admitting: Pulmonary Disease

## 2019-11-18 ENCOUNTER — Other Ambulatory Visit: Payer: Self-pay

## 2019-11-18 DIAGNOSIS — C3490 Malignant neoplasm of unspecified part of unspecified bronchus or lung: Secondary | ICD-10-CM | POA: Diagnosis not present

## 2019-11-18 DIAGNOSIS — J432 Centrilobular emphysema: Secondary | ICD-10-CM | POA: Diagnosis not present

## 2019-11-18 MED ORDER — TRELEGY ELLIPTA 100-62.5-25 MCG/INH IN AEPB: 1.0000 | INHALATION_SPRAY | Freq: Every day | RESPIRATORY_TRACT | 6 refills | Status: AC

## 2019-11-18 NOTE — Assessment & Plan Note (Signed)
He is taking Xopenex nebs 3-4 times daily and not taking his Trelegy regularly. I emphasized taking Trelegy/triple therapy daily hopefully this should help him cut down on his Xopenex use. We also discussed signs and symptoms of a COPD exacerbation.  He can call us and we can if needed get him an early antibiotic.  Steroids do make his sugars run high but he is okay taking this and adjusting his insulin as needed in case of his exacerbation. We will try to decrease his ED visits and hospitalizations by keeping regular follow-up to

## 2019-11-18 NOTE — Progress Notes (Signed)
Subjective:    Patient ID: Chase Herrera, male    DOB: 04/13/1950, 69 y.o.   MRN: [id number]  HPI 69 year old ex-smoker with small cell lung cancer presents to establish care for COPD and chronic hypoxic respiratory failure  He is on oxygen since 2018 He presented 03/2016  right upper lobe lung mass, limited stage small cell lung carcinoma treated with chemotherapy plus radiation.  Whole brain radiation.  Patient was followed at [facility], now with [facility] oncology  Chief Complaint  Patient presents with  . Consult    Patient has COPD, CHF, several ED visits for exacerbations, Wears 2 liters oxygen all the time. Shortness of breath with exertion and when he misses his breathing treatments. Cough that is sometimes productive mostly dry    He was last seen by my partner Dr. Valeta Harms 10/2018 at the [facility] location. He follows with oncology at the cancer center.  Last office visit from 9/21 was reviewed.  Unfortunately he had recurrence of his small cell in 04/2019 and was started on chemotherapy and immunotherapy   CT scan on 04/17/2019 showed worsening right lung nodules. Left lower lobe infectious cavitary lesion was decreased in size. New right middle lobe lung nodule present.  MRI of the brain on 05/13/2019 showed small 7 mm ring enhancing lesion at the junction of the left posterior temporal and anterior occipital lobes compatible with solitary metastasis 04/2019 started on chemo Rx   PMH -  Atrial fibrillation managed with Lopressor, Cardizem, Tikosyn plus Xarelto.     I note frequent ED visits at [facility] for "COPD exacerbation".  Steroids on his sugar high.  He uses Xopenex nebs 3-4 times daily.  He rarely uses Trelegy. He uses oxygen occasionally when he goes outside.  He admits to being sedentary and does not really move around much to need oxygen all the time He quit smoking 7 years ago, smoked more than 50 pack years prior to that  We reviewed prior PFTs and recent CT  chest and MRI brain His vaccinations are up-to-date including Covid and flu  Significant tests/ events reviewed  PFTs 2014, FEV1 47% predicted, 1.68 L, DLCO 63%.  NPSG 03/2017 on 2 L, RDI 4/hour, oxygen desaturation for 58 minutes less than 88%  MRI brain 10/2019 Mild interval increase in size of rim enhancing lesion within the left occipital temporal junction, now measuring 10 x 10 mm compared to 7 x 6 mm on prior MRI - mild vasogenic edema  CT chest/abdomen 10/21/19 Stable post treatment changes in the right lung apex and suprahilar medial right upper lobe. No findings to suggest local recurrence. The nodular consolidative opacity in the left apex identified as new on the previous study has decreased in the interval. 5 mm posterior left lower lobe pulmonary nodule is new in the interval.  Past Medical History:  Diagnosis Date  . Alcohol abuse    Quit 08/23/11  . Arthritis   . Atrial fibrillation ([place])   . Atrial flutter ([facility])    CTI ablation by Dr [name] 10/2011  . Bacteremia due to Gram-positive bacteria 11/29/2016  . CHF (congestive heart failure) ([place])   . COPD (chronic obstructive pulmonary disease) ([place])   . Depression   . Essential hypertension   . History of cardiomyopathy    LVEF 25-30% 08/2011 with subsequent normalization  . History of kidney stones   . Obesity   . Prostate enlargement   . Small cell lung cancer ([facility])    Right  upper lobe - follows with [facility]  . Type 2 diabetes mellitus ([place])    Past Surgical History:  Procedure Laterality Date  . APPENDECTOMY    . ATRIAL ABLATION SURGERY  11/06/2011   CTI ablation for atrial flutter by Dr [name]  . ATRIAL FLUTTER ABLATION N/A 11/06/2011   Procedure: ATRIAL FLUTTER ABLATION;  Surgeon: Thompson Grayer, MD;  Location: [facility] CATH LAB;  Service: Cardiovascular;  Laterality: N/A;  . Cataracts    . COLONOSCOPY WITH PROPOFOL N/A 06/06/2017   Procedure: COLONOSCOPY WITH PROPOFOL;  Surgeon: [name], MD;   Location: [facility] ENDO SUITE;  Service: Endoscopy;  Laterality: N/A;  1:15pm  . IR GENERIC HISTORICAL  04/09/2016   IR US GUIDE VASC ACCESS RIGHT 04/09/2016 [name], DO [facility]-INTERV RAD  . IR GENERIC HISTORICAL  04/09/2016   IR FLUORO GUIDE PORT INSERTION RIGHT 04/09/2016 [name], DO [facility]-INTERV RAD  . PORT-A-CATH REMOVAL N/A 04/24/2017   Procedure: MINOR REMOVAL PORT-A-CATH;  Surgeon: [name], MD;  Location: [facility] ORS;  Service: General;  Laterality: N/A;  . PORTACATH PLACEMENT Left 04/22/2019   Procedure: INSERTION PORT-A-CATH;  Surgeon: Aviva Signs, MD;  Location: [facility] ORS;  Service: General;  Laterality: Left;  . TONSILLECTOMY     No Known Allergies  Social History   Socioeconomic History  . Marital status: Single    Spouse name: Not on file  . Number of children: Not on file  . Years of education: Not on file  . Highest education level: Not on file  Occupational History  . Occupation: retired     Comment: Building services engineer  Tobacco Use  . Smoking status: Former Smoker    Packs/day: 0.50    Years: 47.00    Pack years: 23.50    Types: Cigarettes    Quit date: 05/30/2012    Years since quitting: 7.4  . Smokeless tobacco: Former Systems developer    Quit date: 04/10/2014  . Tobacco comment: encouraged to quit today 03/04/12  Vaping Use  . Vaping Use: Never used  Substance and Sexual Activity  . Alcohol use: No    Comment: quit 3 1/2 years ago  . Drug use: Not Currently    Comment: marajuana occasionally  . Sexual activity: Not Currently  Other Topics Concern  . Not on file  Social History Narrative   Lives in [place] alone.   Disabled due to [place] worked as a Electrical engineer   Social Determinants of Radio broadcast assistant Strain:   . Difficulty of Paying Living Expenses: Not on file  Food Insecurity:   . Worried About Charity fundraiser in the Last Year: Not on file  . Ran Out of Food in the Last Year: Not on file  Transportation Needs:   . Lack of  Transportation (Medical): Not on file  . Lack of Transportation (Non-Medical): Not on file  Physical Activity:   . Days of Exercise per Week: Not on file  . Minutes of Exercise per Session: Not on file  Stress:   . Feeling of Stress : Not on file  Social Connections:   . Frequency of Communication with Friends and Family: Not on file  . Frequency of Social Gatherings with Friends and Family: Not on file  . Attends Religious Services: Not on file  . Active Member of Clubs or Organizations: Not on file  . Attends Archivist Meetings: Not on file  . Marital Status: Not on  file  Intimate Partner Violence:   . Fear of Current or Ex-Partner: Not on file  . Emotionally Abused: Not on file  . Physically Abused: Not on file  . Sexually Abused: Not on file      Review of Systems Shortness of breath with activity Productive cough Irregular heartbeat Nasal congestion Feet swelling Change in color of mucus    Objective:   Physical Exam  Gen. Pleasant, well-nourished, in no distress, normal affect, in wheelchair ENT - no pallor,icterus, no post nasal drip Neck: No JVD, no thyromegaly, no carotid bruits Lungs: no use of accessory muscles, no dullness to percussion, decreased without rales or rhonchi  Cardiovascular: Rhythm regular, heart sounds  normal, no murmurs or gallops, no peripheral edema Abdomen: soft and non-tender, no hepatosplenomegaly, BS normal. Musculoskeletal: No deformities, no cyanosis or clubbing Neuro:  alert, non focal       Assessment & Plan:

## 2019-11-18 NOTE — Assessment & Plan Note (Signed)
Disease seems to be controlled on the chest with chemo and immunotherapy unfortunately 7 mm lesion in the left temporoparietal region has increased to 10 mm with surrounding vasogenic edema.  He has oncology follow-up tomorrow.  He will likely need dexamethasone and referral back to radiation, will defer to oncologist.  He has already received prophylactic whole brain radiation at [facility] so I am not sure if he can get more.  He was sad to hear the results but accepting

## 2019-11-18 NOTE — Patient Instructions (Signed)
Refills on trelegy Brain lesion has increased from 76mm to 10 mm

## 2019-11-19 ENCOUNTER — Other Ambulatory Visit (HOSPITAL_COMMUNITY): Payer: Self-pay | Admitting: *Deleted

## 2019-11-19 ENCOUNTER — Inpatient Hospital Stay (HOSPITAL_COMMUNITY): Payer: Medicare Other

## 2019-11-19 ENCOUNTER — Other Ambulatory Visit: Payer: Self-pay | Admitting: Radiation Therapy

## 2019-11-19 ENCOUNTER — Other Ambulatory Visit: Payer: Self-pay

## 2019-11-19 ENCOUNTER — Inpatient Hospital Stay (HOSPITAL_BASED_OUTPATIENT_CLINIC_OR_DEPARTMENT_OTHER): Payer: Medicare Other | Admitting: Hematology

## 2019-11-19 VITALS — BP 110/60 | HR 66 | Temp 97.2°F | Resp 18

## 2019-11-19 VITALS — BP 126/68 | HR 92 | Temp 97.6°F | Resp 18 | Wt 244.9 lb

## 2019-11-19 DIAGNOSIS — R5383 Other fatigue: Secondary | ICD-10-CM | POA: Diagnosis not present

## 2019-11-19 DIAGNOSIS — I4891 Unspecified atrial fibrillation: Secondary | ICD-10-CM | POA: Diagnosis not present

## 2019-11-19 DIAGNOSIS — I428 Other cardiomyopathies: Secondary | ICD-10-CM | POA: Diagnosis not present

## 2019-11-19 DIAGNOSIS — Z79899 Other long term (current) drug therapy: Secondary | ICD-10-CM | POA: Diagnosis not present

## 2019-11-19 DIAGNOSIS — C3491 Malignant neoplasm of unspecified part of right bronchus or lung: Secondary | ICD-10-CM

## 2019-11-19 DIAGNOSIS — M545 Low back pain, unspecified: Secondary | ICD-10-CM | POA: Diagnosis not present

## 2019-11-19 DIAGNOSIS — C3411 Malignant neoplasm of upper lobe, right bronchus or lung: Secondary | ICD-10-CM | POA: Diagnosis present

## 2019-11-19 DIAGNOSIS — J449 Chronic obstructive pulmonary disease, unspecified: Secondary | ICD-10-CM | POA: Diagnosis not present

## 2019-11-19 DIAGNOSIS — Z9049 Acquired absence of other specified parts of digestive tract: Secondary | ICD-10-CM | POA: Diagnosis not present

## 2019-11-19 DIAGNOSIS — R059 Cough, unspecified: Secondary | ICD-10-CM | POA: Diagnosis not present

## 2019-11-19 DIAGNOSIS — Z87442 Personal history of urinary calculi: Secondary | ICD-10-CM | POA: Diagnosis not present

## 2019-11-19 DIAGNOSIS — C7931 Secondary malignant neoplasm of brain: Secondary | ICD-10-CM | POA: Diagnosis not present

## 2019-11-19 DIAGNOSIS — R200 Anesthesia of skin: Secondary | ICD-10-CM | POA: Diagnosis not present

## 2019-11-19 DIAGNOSIS — I700 Atherosclerosis of aorta: Secondary | ICD-10-CM | POA: Diagnosis not present

## 2019-11-19 DIAGNOSIS — Z87891 Personal history of nicotine dependence: Secondary | ICD-10-CM | POA: Diagnosis not present

## 2019-11-19 DIAGNOSIS — E1169 Type 2 diabetes mellitus with other specified complication: Secondary | ICD-10-CM | POA: Diagnosis not present

## 2019-11-19 DIAGNOSIS — R609 Edema, unspecified: Secondary | ICD-10-CM | POA: Diagnosis not present

## 2019-11-19 DIAGNOSIS — C3490 Malignant neoplasm of unspecified part of unspecified bronchus or lung: Secondary | ICD-10-CM

## 2019-11-19 DIAGNOSIS — E1122 Type 2 diabetes mellitus with diabetic chronic kidney disease: Secondary | ICD-10-CM | POA: Diagnosis not present

## 2019-11-19 DIAGNOSIS — J341 Cyst and mucocele of nose and nasal sinus: Secondary | ICD-10-CM | POA: Diagnosis not present

## 2019-11-19 DIAGNOSIS — N189 Chronic kidney disease, unspecified: Secondary | ICD-10-CM | POA: Diagnosis not present

## 2019-11-19 DIAGNOSIS — Z5112 Encounter for antineoplastic immunotherapy: Secondary | ICD-10-CM | POA: Diagnosis not present

## 2019-11-19 DIAGNOSIS — Z7901 Long term (current) use of anticoagulants: Secondary | ICD-10-CM | POA: Diagnosis not present

## 2019-11-19 DIAGNOSIS — E669 Obesity, unspecified: Secondary | ICD-10-CM | POA: Diagnosis not present

## 2019-11-19 DIAGNOSIS — K5730 Diverticulosis of large intestine without perforation or abscess without bleeding: Secondary | ICD-10-CM | POA: Diagnosis not present

## 2019-11-19 DIAGNOSIS — M7989 Other specified soft tissue disorders: Secondary | ICD-10-CM | POA: Diagnosis not present

## 2019-11-19 LAB — CBC WITH DIFFERENTIAL/PLATELET
Abs Immature Granulocytes: 0.03 10*3/uL (ref 0.00–0.07)
Basophils Absolute: 0 10*3/uL (ref 0.0–0.1)
Basophils Relative: 0 %
Eosinophils Absolute: 0.1 10*3/uL (ref 0.0–0.5)
Eosinophils Relative: 2 %
HCT: 40.8 % (ref 39.0–52.0)
Hemoglobin: 12.6 g/dL — ABNORMAL LOW (ref 13.0–17.0)
Immature Granulocytes: 1 %
Lymphocytes Relative: 6 %
Lymphs Abs: 0.4 10*3/uL — ABNORMAL LOW (ref 0.7–4.0)
MCH: 31.8 pg (ref 26.0–34.0)
MCHC: 30.9 g/dL (ref 30.0–36.0)
MCV: 103 fL — ABNORMAL HIGH (ref 80.0–100.0)
Monocytes Absolute: 0.6 10*3/uL (ref 0.1–1.0)
Monocytes Relative: 10 %
Neutro Abs: 5.2 10*3/uL (ref 1.7–7.7)
Neutrophils Relative %: 81 %
Platelets: 142 10*3/uL — ABNORMAL LOW (ref 150–400)
RBC: 3.96 MIL/uL — ABNORMAL LOW (ref 4.22–5.81)
RDW: 13.8 % (ref 11.5–15.5)
WBC: 6.4 10*3/uL (ref 4.0–10.5)
nRBC: 0 % (ref 0.0–0.2)

## 2019-11-19 LAB — COMPREHENSIVE METABOLIC PANEL
ALT: 33 U/L (ref 0–44)
AST: 25 U/L (ref 15–41)
Albumin: 3.6 g/dL (ref 3.5–5.0)
Alkaline Phosphatase: 62 U/L (ref 38–126)
Anion gap: 10 (ref 5–15)
BUN: 17 mg/dL (ref 8–23)
CO2: 28 mmol/L (ref 22–32)
Calcium: 9 mg/dL (ref 8.9–10.3)
Chloride: 95 mmol/L — ABNORMAL LOW (ref 98–111)
Creatinine, Ser: 1.03 mg/dL (ref 0.61–1.24)
GFR, Estimated: >60 mL/min (ref >60)
Glucose, Bld: 287 mg/dL — ABNORMAL HIGH (ref 70–99)
Potassium: 4.9 mmol/L (ref 3.5–5.1)
Sodium: 133 mmol/L — ABNORMAL LOW (ref 135–145)
Total Bilirubin: 0.6 mg/dL (ref 0.3–1.2)
Total Protein: 6.8 g/dL (ref 6.5–8.1)

## 2019-11-19 LAB — TSH: TSH: 3.053 u[IU]/mL (ref 0.350–4.500)

## 2019-11-19 MED ORDER — BENZONATATE 100 MG PO CAPS: 200.0000 mg | ORAL_CAPSULE | Freq: Three times a day (TID) | ORAL | 1 refills | Status: AC | PRN

## 2019-11-19 MED ORDER — SODIUM CHLORIDE 0.9 % IV SOLN: Freq: Once | INTRAVENOUS | Status: AC

## 2019-11-19 MED ORDER — HYDROCODONE-ACETAMINOPHEN 5-325 MG PO TABS: 1.0000 | ORAL_TABLET | Freq: Every evening | ORAL | 0 refills | Status: DC | PRN

## 2019-11-19 MED ORDER — SODIUM CHLORIDE 0.9% FLUSH
10.0000 mL | INTRAVENOUS | Status: DC | PRN
  Administered 2019-11-19: 10 mL

## 2019-11-19 MED ORDER — SODIUM CHLORIDE 0.9 % IV SOLN
1200.0000 mg | Freq: Once | INTRAVENOUS | Status: AC
  Administered 2019-11-19: 1200 mg via INTRAVENOUS
  Filled 2019-11-19: qty 20

## 2019-11-19 MED ORDER — HEPARIN SOD (PORK) LOCK FLUSH 100 UNIT/ML IV SOLN
500.0000 [IU] | Freq: Once | INTRAVENOUS | Status: AC | PRN
  Administered 2019-11-19: 500 [IU]

## 2019-11-19 NOTE — Progress Notes (Signed)
Spoke with MD. Patient has prednisone on med list but does not take it routinely only for COPD exacerbation and then only takes for 5 days.  Is currently not on any prednisone.  Ok to proceed with Tecentriq.  T.O. Dr Rhys Martini, PharmD

## 2019-11-19 NOTE — Progress Notes (Signed)
Patient was assessed by Dr. Katragadda and labs have been reviewed.  Patient is okay to proceed with treatment today. Primary RN and pharmacy aware.   

## 2019-11-19 NOTE — Progress Notes (Signed)
Chase Herrera, [place] [address]   CLINIC:  Medical Oncology/Hematology  PCP:  [facility] / [place] [address] [phone]   REASON FOR VISIT:  Follow-up for right small cell lung cancer  PRIOR THERAPY: Carboplatin, VP-16 and atezolizumab x 3 cycles from 05/06/2019 to 08/28/2019  NGS Results: Not done  CURRENT THERAPY: Atezolizumab every 3 weeks  BRIEF ONCOLOGIC HISTORY:  Oncology History  Small cell lung cancer ([place])  04/03/2016 Initial Diagnosis   Small cell lung cancer ([place])   04/09/2016 Imaging   MRI brain- Negative for metastatic disease to the brain. No acute abnormality.   04/09/2016 Procedure   Right IJ port catheter placement by IR   04/13/2016 PET scan   . The right apical lung mass has significantly enlarged and there is new right paratracheal adenopathy as well as a hypermetabolic new lymph node in the right inner infraclavicular fossa potentially with early impingement on adjacent neurovascular structures. 2. Other imaging findings of potential clinical significance: Chronic bilateral maxillary sinusitis. Left carotid atherosclerotic calcification. Aortoiliac atherosclerotic vascular disease. Coronary atherosclerosis. Paraseptal emphysema. Mild atelectasis in the lung bases. Scattered sigmoid colon diverticula. Probable right pars defect at L5.   05/09/2016 - 07/12/2016 Radiation Therapy   Radiotherapy to right Lung+MS 63 Gy/35 fractions at 1.8 GY/f using 6 x photons 3- D CRT technique Dates: 4/18-6/21/18       06/19/2016 Treatment Plan Change   Cisplatin dose reduced by 20% for cycle #4 due to severe thrombocytopenia following cycle #3 (13,000).   08/21/2016 PET scan   1. Marked reduction in size and metabolic activity of RIGHT upper lobe mass. 2. Resolution of mediastinal and RIGHT supraclavicular lymphadenopathy. 3. No evidence of disease progression.   05/06/2019 -   Chemotherapy   The patient had palonosetron (ALOXI) injection 0.25 mg, 0.25 mg, Intravenous,  Once, 6 of 6 cycles Administration: 0.25 mg (05/06/2019), 0.25 mg (05/27/2019), 0.25 mg (06/17/2019), 0.25 mg (07/15/2019), 0.25 mg (08/05/2019), 0.25 mg (08/26/2019) pegfilgrastim-cbqv (UDENYCA) injection 6 mg, 6 mg, Subcutaneous, Once, 6 of 6 cycles Administration: 6 mg (05/11/2019), 6 mg (06/01/2019), 6 mg (06/23/2019), 6 mg (07/20/2019), 6 mg (08/10/2019), 6 mg (08/31/2019) CARBOplatin (PARAPLATIN) 450 mg in sodium chloride 0.9 % 250 mL chemo infusion, 450 mg (100 % of original dose 446.4 mg), Intravenous,  Once, 6 of 6 cycles Dose modification:   (original dose 446.4 mg, Cycle 1, Reason: Provider Judgment),   (original dose 500 mg, Cycle 2, Reason: Provider Judgment), 500 mg (original dose 500 mg, Cycle 2, Reason: Provider Judgment), 500 mg (original dose 500 mg, Cycle 3),   (original dose 527.6 mg, Cycle 4, Reason: Provider Judgment) Administration: 450 mg (05/06/2019), 500 mg (05/27/2019), 500 mg (06/17/2019), 540 mg (07/15/2019), 510 mg (08/05/2019), 540 mg (08/26/2019) etoposide (VEPESID) 190 mg in sodium chloride 0.9 % 500 mL chemo infusion, 80 mg/m2 = 190 mg (80 % of original dose 100 mg/m2), Intravenous,  Once, 6 of 6 cycles Dose modification: 80 mg/m2 (80 % of original dose 100 mg/m2, Cycle 1, Reason: Provider Judgment), 80 mg/m2 (original dose 100 mg/m2, Cycle 1, Reason: Provider Judgment), 80 mg/m2 (80 % of original dose 100 mg/m2, Cycle 2, Reason: Provider Judgment) Administration: 190 mg (05/06/2019), 190 mg (05/07/2019), 190 mg (05/08/2019), 190 mg (05/27/2019), 190 mg (05/28/2019), 190 mg (05/29/2019), 190 mg (06/17/2019), 190 mg (06/18/2019), 190 mg (06/19/2019), 190 mg (07/15/2019), 190 mg (07/16/2019), 190 mg (07/17/2019), 190 mg (08/05/2019), 190 mg (08/06/2019), 190  mg (08/07/2019), 190 mg (08/26/2019), 190 mg (08/27/2019), 190 mg (08/28/2019) fosaprepitant (EMEND) 150 mg in sodium chloride 0.9 % 145 mL IVPB, 150 mg, Intravenous,  Once,  6 of 6 cycles Administration: 150 mg (05/06/2019), 150 mg (05/27/2019), 150 mg (06/17/2019), 150 mg (07/15/2019), 150 mg (08/05/2019), 150 mg (08/26/2019) atezolizumab (TECENTRIQ) 1,200 mg in sodium chloride 0.9 % 250 mL chemo infusion, 1,200 mg, Intravenous, Once, 8 of 10 cycles Administration: 1,200 mg (05/06/2019), 1,200 mg (05/27/2019), 1,200 mg (06/17/2019), 1,200 mg (07/15/2019), 1,200 mg (09/16/2019), 1,200 mg (10/13/2019), 1,200 mg (08/05/2019), 1,200 mg (08/26/2019)  for chemotherapy treatment.      CANCER STAGING: Cancer Staging Small cell lung cancer ([place]) Staging form: Lung, AJCC 8th Edition - Clinical: Stage IIIC (cT3, cN3, cM0) - Signed by Chase First, MD on 04/16/2016   INTERVAL HISTORY:  [name], a 69 y.o. male, returns for routine follow-up and consideration for next cycle of chemotherapy. Chase Herrera was last seen on 10/13/2019.  Due for cycle #9 of atezolizumab today.   Overall, he tells me he has been feeling okay. He missed his last appointment because he slept in. He denies having any symptoms from his brain mets and is wondering if he can do anything to shrink it; he denies headaches or vision changes. He is tolerating the treatment well and denies diarrhea, worsening SOB, or rashes. He is tolerating Eliquis well and denies having nosebleeds, hematochezia or hematuria. His cough is controlled with Tessalon. The numbness in his hands and feet is stable with gabapentin. His appetite is excellent.  He saw Dr. Eartha Herrera yesterday.  Overall, he feels ready for next cycle of chemo today.    REVIEW OF SYSTEMS:  Review of Systems  Constitutional: Positive for fatigue (50%). Negative for appetite change.  Eyes: Negative for eye problems.  Respiratory: Positive for cough (controlled w/ Tessalon) and shortness of breath (stable).   Gastrointestinal: Negative for blood in stool and diarrhea.  Genitourinary: Negative for hematuria.   Skin: Negative for rash.  Neurological: Positive for  numbness (hands & feet). Negative for headaches.  All other systems reviewed and are negative.   PAST MEDICAL/SURGICAL HISTORY:  Past Medical History:  Diagnosis Date  . Alcohol abuse    Quit 08/23/11  . Arthritis   . Atrial fibrillation ([place])   . Atrial flutter ([place])    CTI ablation by Dr [name] 10/2011  . Bacteremia due to Gram-positive bacteria 11/29/2016  . CHF (congestive heart failure) ([place])   . COPD (chronic obstructive pulmonary disease) ([place])   . Depression   . Essential hypertension   . History of cardiomyopathy    LVEF 25-30% 08/2011 with subsequent normalization  . History of kidney stones   . Obesity   . Prostate enlargement   . Small cell lung cancer ([place])    Right upper lobe - follows with [facility]  . Type 2 diabetes mellitus ([place])    Past Surgical History:  Procedure Laterality Date  . APPENDECTOMY    . ATRIAL ABLATION SURGERY  11/06/2011   CTI ablation for atrial flutter by Dr [name]  . ATRIAL FLUTTER ABLATION N/A 11/06/2011   Procedure: ATRIAL FLUTTER ABLATION;  Surgeon: Thompson Grayer, MD;  Location: [facility] CATH LAB;  Service: Cardiovascular;  Laterality: N/A;  . Cataracts    . COLONOSCOPY WITH PROPOFOL N/A 06/06/2017   Procedure: COLONOSCOPY WITH PROPOFOL;  Surgeon: [name], MD;  Location: [facility] ENDO SUITE;  Service: Endoscopy;  Laterality: N/A;  1:15pm  . IR  GENERIC HISTORICAL  04/09/2016   IR US GUIDE VASC ACCESS RIGHT 04/09/2016 [name], DO [facility]-INTERV RAD  . IR GENERIC HISTORICAL  04/09/2016   IR FLUORO GUIDE PORT INSERTION RIGHT 04/09/2016 [name], DO [facility]-INTERV RAD  . PORT-A-CATH REMOVAL N/A 04/24/2017   Procedure: MINOR REMOVAL PORT-A-CATH;  Surgeon: [name], MD;  Location: [facility] ORS;  Service: General;  Laterality: N/A;  . PORTACATH PLACEMENT Left 04/22/2019   Procedure: INSERTION PORT-A-CATH;  Surgeon: Aviva Signs, MD;  Location: [facility] ORS;  Service: General;  Laterality: Left;  . TONSILLECTOMY      SOCIAL HISTORY:  Social  History   Socioeconomic History  . Marital status: Single    Spouse name: Not on file  . Number of children: Not on file  . Years of education: Not on file  . Highest education level: Not on file  Occupational History  . Occupation: retired     Comment: Building services engineer  Tobacco Use  . Smoking status: Former Smoker    Packs/day: 0.50    Years: 47.00    Pack years: 23.50    Types: Cigarettes    Quit date: 05/30/2012    Years since quitting: 7.4  . Smokeless tobacco: Former Systems developer    Quit date: 04/10/2014  . Tobacco comment: encouraged to quit today 03/04/12  Vaping Use  . Vaping Use: Never used  Substance and Sexual Activity  . Alcohol use: No    Comment: quit 3 1/2 years ago  . Drug use: Not Currently    Comment: marajuana occasionally  . Sexual activity: Not Currently  Other Topics Concern  . Not on file  Social History Narrative   Lives in [place] alone.   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 Difficulty of Paying Living Expenses: Not on file  Food Insecurity:   . Worried About Charity fundraiser in the Last Year: Not on file  . Ran Out of Food in the Last Year: Not on file  Transportation Needs:   . Lack of Transportation (Medical): Not on file  . Lack of Transportation (Non-Medical): Not on file  Physical Activity:   . Days of Exercise per Week: Not on file  . Minutes of Exercise per Session: Not on file  Stress:   . Feeling of Stress : Not on file  Social Connections:   . Frequency of Communication with Friends and Family: Not on file  . Frequency of Social Gatherings with Friends and Family: Not on file  . Attends Religious Services: Not on file  . Active Member of Clubs or Organizations: Not on file  . Attends Archivist Meetings: Not on file  . Marital Status: Not on file  Intimate Partner Violence:   . Fear of Current or Ex-Partner: Not on file  .  Emotionally Abused: Not on file  . Physically Abused: Not on file  . Sexually Abused: Not on file    FAMILY HISTORY:  Family History  Problem Relation Age of Onset  . Diabetes Sister   . Hypertension Mother   . Lung cancer Mother   . Hypertension Father   . Heart attack Father   . Hypertension Sister   . Hypertension Sister   . Hypertension Sister   . Pulmonary fibrosis Sister   . Breast cancer Cousin   . Colon cancer Neg Hx     CURRENT  MEDICATIONS:  Current Outpatient Medications  Medication Sig Dispense Refill  . acetaminophen (TYLENOL) 500 MG tablet Take 1,000 mg by mouth every 6 (six) hours as needed for moderate pain or headache.     Marland Kitchen apixaban (ELIQUIS) 5 MG TABS tablet Take 5 mg by mouth 2 (two) times daily.     Marland Kitchen AQUALANCE LANCETS 30G MISC USE TO check blood glucose twice daily  99  . Artificial Saliva (BIOTENE DRY MOUTH MOISTURIZING) SOLN Apply 1 Dose topically as needed (dry mouth).     Marland Kitchen atorvastatin (LIPITOR) 20 MG tablet Take 1 tablet (20 mg total) by mouth daily. 90 tablet 3  . benzonatate (TESSALON) 100 MG capsule Take 200 mg by mouth every 8 (eight) hours as needed.    . Carboxymethylcellul-Glycerin (LUBRICATING EYE DROPS OP) Place 1 drop into the left eye daily as needed (dry eyes).     Marland Kitchen diltiazem (DILACOR XR) 240 MG 24 hr capsule Take 240 mg by mouth daily.    Marland Kitchen dofetilide (TIKOSYN) 250 MCG capsule Take 1 capsule (250 mcg total) by mouth 2 (two) times daily.    . fluticasone (FLONASE) 50 MCG/ACT nasal spray Place 2 sprays into both nostrils daily. 16 g 6  . Fluticasone-Umeclidin-Vilant (TRELEGY ELLIPTA) 100-62.5-25 MCG/INH AEPB Inhale 1 puff into the lungs daily. 60 each 6  . furosemide (LASIX) 40 MG tablet TAKE 1 TABLET BY MOUTH EVERY DAY 90 tablet 0  . gabapentin (NEURONTIN) 300 MG capsule Take 600 mg by mouth 2 (two) times daily.     Marland Kitchen GLOBAL EASE INJECT PEN NEEDLES 31G X 8 MM MISC Inject into the skin 3 (three) times daily.    Marland Kitchen guaifenesin (ROBITUSSIN)  100 MG/5ML syrup Take 200 mg by mouth at bedtime as needed for cough.     Marland Kitchen HM SENNA 8.6 MG tablet Take 2 tablets by mouth at bedtime.    Marland Kitchen HYDROcodone-acetaminophen (NORCO/VICODIN) 5-325 MG tablet Take 1 tablet by mouth at bedtime as needed. 30 tablet 0  . hydrOXYzine (VISTARIL) 25 MG capsule Take 25 mg by mouth every 8 (eight) hours.    . insulin lispro (HUMALOG) 100 UNIT/ML injection Inject 2-20 Units into the skin 3 (three) times daily with meals. Sliding scale     . Insulin Syringe-Needle U-100 28G X 1/2" 1 ML MISC 1 each by Does not apply route 3 (three) times daily. 100 each 3  . ipratropium-albuterol (DUONEB) 0.5-2.5 (3) MG/3ML SOLN Take 3 mLs by nebulization every 4 (four) hours as needed (shortness of breath).     Elmore Guise Devices (ADJUSTABLE LANCING DEVICE) MISC TO check blood glucose daily    . LANTUS SOLOSTAR 100 UNIT/ML Solostar Pen INJECT 20 UNITS UNDER THE SKIN EVERY MORNING AND INJECT 20 UNITS UNDER THE SKIN EVERY EVENING (Patient taking differently: Inject 20 Units into the skin 2 (two) times daily. ) 45 mL 0  . levalbuterol (XOPENEX) 0.63 MG/3ML nebulizer solution Inhale 3 mLs (0.63 mg total) into the lungs 4 (four) times daily. 3 mL 0  . Lidocaine (SALONPAS PAIN RELIEVING EX) Apply 1 patch topically daily.     . metFORMIN (GLUCOPHAGE) 500 MG tablet Take 500 mg by mouth in the morning and at bedtime.    . metoprolol succinate (TOPROL-XL) 100 MG 24 hr tablet Take 100 mg by mouth daily.    . Misc. Devices MISC Please provide patient with an electric wheelchair. 1 each 0  . Multiple Vitamin (MULTIVITAMIN WITH MINERALS) TABS tablet Take 1 tablet by mouth daily.    Marland Kitchen  potassium chloride (KLOR-CON) 10 MEQ tablet Take 10 mEq by mouth 2 (two) times daily.    . predniSONE (DELTASONE) 20 MG tablet Take 60 mg by mouth daily.    . pregabalin (LYRICA) 150 MG capsule Take 150 mg by mouth in the morning and at bedtime.    . senna-docusate (SENOKOT-S) 8.6-50 MG tablet Take 2 tablets by mouth at  bedtime. 60 tablet 0  . spironolactone (ALDACTONE) 25 MG tablet Take 12.5 mg by mouth 2 (two) times daily.     . tamsulosin (FLOMAX) 0.4 MG CAPS capsule Take 1 capsule (0.4 mg total) by mouth 2 (two) times daily after a meal. 180 capsule 1  . traZODone (DESYREL) 50 MG tablet Take 50 mg by mouth at bedtime.  5  . lidocaine-prilocaine (EMLA) cream Apply a small amount to port a cath site and cover with plastic wrap 1 hour prior to chemotherapy appointments (Patient not taking: Reported on 11/19/2019) 30 g 3  . PROAIR HFA 108 (90 Base) MCG/ACT inhaler INHALE TWO PUFFS BY MOUTH EVERY 4 HOURS AS NEEDED FOR WHEEZING (Patient not taking: Reported on 11/19/2019) 17 g 0  . prochlorperazine (COMPAZINE) 10 MG tablet Take 1 tablet (10 mg total) by mouth every 6 (six) hours as needed (Nausea or vomiting). (Patient not taking: Reported on 11/19/2019) 60 tablet 1   No current facility-administered medications for this visit.    ALLERGIES:  No Known Allergies  PHYSICAL EXAM:  Performance status (ECOG): 1 - Symptomatic but completely ambulatory  Vitals:   11/19/19 0816  BP: 126/68  Pulse: 92  Resp: 18  Temp: 97.6 F (36.4 C)  SpO2: 92%   Wt Readings from Last 3 Encounters:  11/19/19 244 lb 14.9 oz (111.1 kg)  11/18/19 240 lb (108.9 kg)  10/13/19 244 lb 11.4 oz (111 kg)   Physical Exam Vitals reviewed.  Constitutional:      Appearance: Normal appearance. He is obese.  Cardiovascular:     Rate and Rhythm: Normal rate and regular rhythm.     Pulses: Normal pulses.     Heart sounds: Normal heart sounds.  Pulmonary:     Effort: Pulmonary effort is normal.     Breath sounds: Normal breath sounds.  Chest:     Comments: Port-a-Cath in L chest Musculoskeletal:     Right lower leg: Edema (1+) present.     Left lower leg: Edema (1+) present.  Neurological:     General: No focal deficit present.     Mental Status: He is alert and oriented to person, place, and time.  Psychiatric:        Mood  and Affect: Mood normal.        Behavior: Behavior normal.     LABORATORY DATA:  I have reviewed the labs as listed.  CBC Latest Ref Rng & Units 11/19/2019 10/13/2019 09/16/2019  WBC 4.0 - 10.5 K/uL 6.4 6.7 10.2  Hemoglobin 13.0 - 17.0 g/dL 12.6(L) 10.8(L) 9.7(L)  Hematocrit 39 - 52 % 40.8 35.9(L) 32.1(L)  Platelets 150 - 400 K/uL 142(L) 146(L) 161   CMP Latest Ref Rng & Units 11/19/2019 10/13/2019 09/16/2019  Glucose 70 - 99 mg/dL 287(H) 161(H) 236(H)  BUN 8 - 23 mg/dL 17 12 13   Creatinine 0.61 - 1.24 mg/dL 1.03 1.01 0.90  Sodium 135 - 145 mmol/L 133(L) 138 134(L)  Potassium 3.5 - 5.1 mmol/L 4.9 4.0 3.7  Chloride 98 - 111 mmol/L 95(L) 99 97(L)  CO2 22 - 32 mmol/L 28 30 28  Calcium 8.9 - 10.3 mg/dL 9.0 9.1 8.8(L)  Total Protein 6.5 - 8.1 g/dL 6.8 6.6 6.4(L)  Total Bilirubin 0.3 - 1.2 mg/dL 0.6 0.6 0.6  Alkaline Phos 38 - 126 U/L 62 51 72  AST 15 - 41 U/L 25 21 27   ALT 0 - 44 U/L 33 25 31    DIAGNOSTIC IMAGING:  I have independently reviewed the scans and discussed with the patient. MR Brain W Wo Contrast  Result Date: 10/28/2019 CLINICAL DATA:  Small cell lung cancer. Evaluate treatment response. EXAM: MRI HEAD WITHOUT AND WITH CONTRAST TECHNIQUE: Multiplanar, multiecho pulse sequences of the brain and surrounding structures were obtained without and with intravenous contrast. CONTRAST:  64mL GADAVIST GADOBUTROL 1 MMOL/ML IV SOLN COMPARISON:  MRI of the brain July 31, 2019. FINDINGS: The study is degraded by motion, particularly the postcontrast images. Brain: Rim enhancing lesion within the left occipital temporal junction measuring 10 x 10 mm compared to 7 x 6 mm on prior. There is mild surrounding vasogenic edema, progressed from prior MRI. No new lesion identified. No acute infarction, hemorrhage, hydrocephalus or extra-axial collection. Scattered and confluent foci of T2 hyperintensity is seen within the white matter of the cerebral hemispheres, unchanged from prior. Vascular: Normal  flow voids. Skull and upper cervical spine: Normal marrow signal. Sinuses/Orbits: Mucous retention cysts in the bilateral maxillary sinuses. Bilateral lens surgery. Other: Left mastoid effusion. IMPRESSION: Mild interval increase in size of rim enhancing lesion within the left occipital temporal junction, now measuring 10 x 10 mm compared to 7 x 6 mm on prior MRI. No new lesion identified. Electronically Signed   By: Pedro Earls M.D.   On: 10/28/2019 13:41   CT CHEST ABDOMEN PELVIS W CONTRAST  Result Date: 10/21/2019 CLINICAL DATA:  Small-cell lung cancer.  Restaging. EXAM: CT CHEST, ABDOMEN, AND PELVIS WITH CONTRAST TECHNIQUE: Multidetector CT imaging of the chest, abdomen and pelvis was performed following the standard protocol during bolus administration of intravenous contrast. CONTRAST:  157mL OMNIPAQUE IOHEXOL 300 MG/ML  SOLN COMPARISON:  07/07/2019 FINDINGS: CT CHEST FINDINGS Cardiovascular: The heart size is normal. No substantial pericardial effusion. Coronary artery calcification is evident. Atherosclerotic calcification is noted in the wall of the thoracic aorta. Mediastinum/Nodes: No mediastinal lymphadenopathy. There is no hilar lymphadenopathy. The esophagus has normal imaging features. There is no axillary lymphadenopathy. Lungs/Pleura: Stable post treatment changes in the right lung apex and suprahilar medial right upper lobe. Centrilobular and paraseptal emphysema evident. Bronchial wall thickening with bronchiectasis and areas of small airway impaction in the bilateral lower lobes, medial right middle lobe, and lingula again noted, similar to prior. 5 mm posterior left lower lobe nodule on 96/4 is new in the interval. 1.9 cm nodular opacity in the posterior left lower lobe on 92/4 is stable. The nodular consolidative opacity in the left apex identified as new on the previous study has decreased in the interval (13/4 today). No new overtly suspicious nodule or mass.  No pleural  effusion. Musculoskeletal: No worrisome lytic or sclerotic osseous abnormality. CT ABDOMEN PELVIS FINDINGS Hepatobiliary: No suspicious focal abnormality within the liver parenchyma. Gallbladder decompressed. No intrahepatic or extrahepatic biliary dilation. Pancreas: No focal mass lesion. No dilatation of the main duct. No intraparenchymal cyst. No peripancreatic edema. Spleen: No splenomegaly. No focal mass lesion. Adrenals/Urinary Tract: No adrenal nodule or mass. Kidneys unremarkable. No evidence for hydroureter. The urinary bladder appears normal for the degree of distention. Stomach/Bowel: Stomach is unremarkable. No gastric wall thickening. No evidence of  outlet obstruction. Duodenum is normally positioned as is the ligament of Treitz. No small bowel wall thickening. No small bowel dilatation. The terminal ileum is normal. The appendix is not visualized, but there is no edema or inflammation in the region of the cecum. Diverticular changes are noted in the left colon without evidence of diverticulitis. Prominent stool volume. Vascular/Lymphatic: There is abdominal aortic atherosclerosis without aneurysm. There is no gastrohepatic or hepatoduodenal ligament lymphadenopathy. No retroperitoneal or mesenteric lymphadenopathy. No pelvic sidewall lymphadenopathy. Reproductive: The prostate gland and seminal vesicles are unremarkable. Other: No intraperitoneal free fluid. Musculoskeletal: No worrisome lytic or sclerotic osseous abnormality. IMPRESSION: 1. Stable post treatment changes in the right lung apex and suprahilar medial right upper lobe. No findings to suggest local recurrence. 2. The nodular consolidative opacity in the left apex identified as new on the previous study has decreased in the interval. 3. 5 mm posterior left lower lobe pulmonary nodule is new in the interval. This is likely infectious/inflammatory, but close attention on follow-up recommended. Additional left lower lobe pulmonary nodules are  stable in the interval. Continued attention on follow-up recommended. 4. Prominent stool volume. Imaging features could be compatible with constipation. 5. Left colonic diverticulosis without diverticulitis. 6. Aortic Atherosclerosis (ICD10-I70.0) and Emphysema (ICD10-J43.9). Electronically Signed   By: Misty Stanley M.D.   On: 10/21/2019 14:05     ASSESSMENT:  1. Recurrent small cell lung cancer: -Treated for limited stage small cell lung cancer with chemoradiation with cisplatin and etoposide therapy on 06/21/2016 followed by PCI. -CT scan on 04/17/2019 showed worsening right lung nodules. Left lower lobe infectious cavitary lesion was decreased in size. New right middle lobe lung nodule present. -6 cycles of carboplatin, VP-16 and atezolizumab from 05/06/2019 through 08/26/2019. -CT CAP on 07/07/2019 showed improvement in the right lung cancer. Interval development of consolidative change in the left upper lobe consistent with infection/inflammation. -CT CAP on 10/21/2019 showed stable posttreatment changes in the right lung apex and suprahilar medial right upper lobe.  Nodular opacity in the left apex has decreased in size.  5 mm posterior left lower lobe nodule is new, likely infectious/inflammatory.  Left lower lobe nodules are stable.  2. Brain metastasis: -MRI of the brain on 05/13/2019 showed small 7 mm ring enhancing lesion at the junction of the left posterior temporal and anterior occipital lobes compatible with solitary metastasis. Typical appearance of lacunar infarct is also considered. -MRI of the brain on 07/31/2019 shows stable 7 mm solitary metastasis in the left occipital temporal junction. -MRI of the brain on 10/28/2019 showed mild increase in size of rim-enhancing lesion measuring 10 x 10 mm.  No new lesions identified.   PLAN:  1. Recurrent small cell lung cancer: -I have reviewed results of his CT chest, abdomen and pelvis which showed improvement. -Reviewed labs which  showed glucose 287 normal LFTs.  CBC shows mildly low platelet count.  TSH was 3.053. -He will proceed with Atezolizumab today.   Reevaluate in 3 weeks.  2. CKD: -Creatinine is 1.03 and staying stable.  3. Diabetes: -Continue Lantus twice daily and NovoLog sliding scale.  4. Atrial fibrillation: -Continue Eliquis 5 mg twice daily.  No bleeding issues.  5. Low back pain: -Continue hydrocodone 5/325 at bedtime as needed.  Refill given.  6. Brain metastasis: -We reviewed results of the MRI of the brain from 10/28/2019 which showed mild interval increase in size of the left occipital temporal lesion. -We'll make a referral to radiation for SRS.  7. Lower extremity swelling: -  Continue spironolactone 1 and half tablets daily.   Orders placed this encounter:  No orders of the defined types were placed in this encounter.    Derek Jack, MD [place] [phone]   I, Milinda Antis, am acting as a scribe for Dr. Sanda Linger.  I, Derek Jack MD, have reviewed the above documentation for accuracy and completeness, and I agree with the above.

## 2019-11-19 NOTE — Patient Instructions (Signed)
[facility] at [facility] Discharge Instructions  Labs drawn from portacath today   Thank you for choosing [facility] at [facility] to provide your oncology and hematology care.  To afford each patient quality time with our provider, please arrive at least 15 minutes before your scheduled appointment time.   If you have a lab appointment with the Cancer Center please come in thru the Main Entrance and check in at the main information desk.  You need to re-schedule your appointment should you arrive 10 or more minutes late.  We strive to give you quality time with our providers, and arriving late affects you and other patients whose appointments are after yours.  Also, if you no show three or more times for appointments you may be dismissed from the clinic at the providers discretion.     Again, thank you for choosing [facility].  Our hope is that these requests will decrease the amount of time that you wait before being seen by our physicians.       _____________________________________________________________  Should you have questions after your visit to [facility], please contact our office at [phone] and follow the prompts.  Our office hours are 8:00 a.m. and 4:30 p.m. Monday - Friday.  Please note that voicemails left after 4:00 p.m. may not be returned until the following business day.  We are closed weekends and major holidays.  You do have access to a nurse 24-7, just call the main number to the clinic [phone] and do not press any options, hold on the line and a nurse will answer the phone.    For prescription refill requests, have your pharmacy contact our office and allow 72 hours.    Due to Covid, you will need to wear a mask upon entering the hospital. If you do not have a mask, a mask will be given to you at the Main Entrance upon arrival. For doctor visits, patients may have 1 support person age 18  or older with them. For treatment visits, patients can not have anyone with them due to social distancing guidelines and our immunocompromised population.     

## 2019-11-19 NOTE — Patient Instructions (Signed)
[place] at [facility] Discharge Instructions  You were seen today by Dr. Delton Coombes. He went over your recent results and scans. You received your treatment today. You will be referred to radiation in [facility] for treatment of your brain metastasis. Dr. Delton Coombes will see you back in 3 weeks for labs and follow up.   Thank you for choosing [place] at [facility] to provide your oncology and hematology care.  To afford each patient quality time with our provider, please arrive at least 15 minutes before your scheduled appointment time.   If you have a lab appointment with the [facility] please come in thru the Main Entrance and check in at the main information desk  You need to re-schedule your appointment should you arrive 10 or more minutes late.  We strive to give you quality time with our providers, and arriving late affects you and other patients whose appointments are after yours.  Also, if you no show three or more times for appointments you may be dismissed from the clinic at the providers discretion.     Again, thank you for choosing [facility]  Our hope is that these requests will decrease the amount of time that you wait before being seen by our physicians.       _____________________________________________________________  Should you have questions after your visit to [facility], please contact our office at [phone] between the hours of 8:00 a.m. and 4:30 p.m.  Voicemails left after 4:00 p.m. will not be returned until the following business day.  For prescription refill requests, have your pharmacy contact our office and allow 72 hours.    Cancer Center Support Programs:   > Cancer Support Group  2nd Tuesday of the month 1pm-2pm, Journey Room

## 2019-11-19 NOTE — Progress Notes (Signed)
Patient presents today for treatment and follow up visit with Dr. Delton Coombes. MAR reviewed. Labs pending. Vital signs within parameters for treatment. Patient has no complaints of any changes since his last visit.   Labs within parameters for treatment today.   Verbal order received from [place] RN/ Dr. Delton Coombes to proceed with treatment. Labs reviewed by MD.   Treatment given today per MD orders. Tolerated infusion without adverse affects. Vital signs stable. No complaints at this time. Discharged from clinic via wheel chair in stable condition. Alert and oriented x 3. F/U with [facility] as scheduled.

## 2019-11-19 NOTE — Patient Instructions (Signed)
[facility] Discharge Instructions for Patients Receiving Chemotherapy  Today you received the following chemotherapy agents   To help prevent nausea and vomiting after your treatment, we encourage you to take your nausea medication   If you develop nausea and vomiting that is not controlled by your nausea medication, call the clinic.   BELOW ARE SYMPTOMS THAT SHOULD BE REPORTED IMMEDIATELY:  *FEVER GREATER THAN 100.5 F  *CHILLS WITH OR WITHOUT FEVER  NAUSEA AND VOMITING THAT IS NOT CONTROLLED WITH YOUR NAUSEA MEDICATION  *UNUSUAL SHORTNESS OF BREATH  *UNUSUAL BRUISING OR BLEEDING  TENDERNESS IN MOUTH AND THROAT WITH OR WITHOUT PRESENCE OF ULCERS  *URINARY PROBLEMS  *BOWEL PROBLEMS  UNUSUAL RASH Items with * indicate a potential emergency and should be followed up as soon as possible.  Feel free to call the clinic should you have any questions or concerns. The clinic phone number is [phone].  Please show the CHEMO ALERT CARD at check-in to the Emergency Department and triage nurse.   

## 2019-11-23 ENCOUNTER — Other Ambulatory Visit: Payer: Self-pay | Admitting: Radiation Therapy

## 2019-11-23 DIAGNOSIS — C7931 Secondary malignant neoplasm of brain: Secondary | ICD-10-CM

## 2019-11-23 DIAGNOSIS — C7949 Secondary malignant neoplasm of other parts of nervous system: Secondary | ICD-10-CM

## 2019-11-23 NOTE — Progress Notes (Signed)
Orders placed for Port acces the day of his brain MRI at GI.   Mont Dutton R.T.(R)(T) Radiation Special Procedures Navigator

## 2019-11-23 NOTE — Progress Notes (Signed)
Location/Histology of Brain Tumor: Left Occipital Temporal lobe  Primary: Small cell lung cancer diagnosed in 03/2016  Patient presented with symptoms of:    MRI Brain 10/28/2019:  Mild increase in size of rim-enhancing lesion measuring 10 x 10 mm.  No new lesions identified.  MRI Brain 07/31/2019: Stable 7 mm solitary metastasis in the left occipital temporal junction.  MRI Brain 05/13/2019: 7 mm ring enhancing lesion at the junction of the left posterior temporal and anterior occipital lobes compatible with solitary metastasis.  Past or anticipated interventions, if any, per neurosurgery:   Past or anticipated interventions, if any, per medical oncology:  Dr. Delton Coombes 11/19/2019 - Chemotherapy 05/06/2019- -Completed 9 cycles to lung. -MRI brain from 10/6 showed mild interval increase in size of occipital temporal lesion. -We'll make referral to radiation for SRS.   Dose of Decadron, if applicable: None  Recent neurologic symptoms, if any:   Seizures: no  Headaches: None  Nausea: No  Dizziness/ataxia: Has slight unsteady gait.  No dizziness.   Difficulty with hand coordination: No  Focal numbness/weakness: numbness in hands and feet, taking Gabapentin.  Left hand worse.  Visual deficits/changes: no  Confusion/Memory deficits: no    SAFETY ISSUES:  Prior radiation? Right Lung April/May/June 2018 180x35=6300 cGy, PCI Brain September/October 2018 250x10=2500 cGy  Pacemaker/ICD? No  Possible current pregnancy? n/a  Is the patient on methotrexate? No  Additional Complaints / other details:  -Has port

## 2019-11-24 ENCOUNTER — Ambulatory Visit
Admission: RE | Admit: 2019-11-24 | Discharge: 2019-11-24 | Disposition: A | Discharge status: Home / Self Care | Payer: Medicare Other | Source: Ambulatory Visit | Attending: Radiation Oncology | Admitting: Radiation Oncology

## 2019-11-24 ENCOUNTER — Other Ambulatory Visit: Payer: Self-pay

## 2019-11-24 VITALS — Ht 73.0 in | Wt 244.0 lb

## 2019-11-24 DIAGNOSIS — C7931 Secondary malignant neoplasm of brain: Secondary | ICD-10-CM

## 2019-11-24 DIAGNOSIS — C3490 Malignant neoplasm of unspecified part of unspecified bronchus or lung: Secondary | ICD-10-CM

## 2019-11-24 NOTE — Progress Notes (Signed)
Radiation Oncology         [phone] ________________________________  Initial Outpatient Consultation - Conducted via telephone due to current COVID-19 concerns for limiting patient exposure  I spoke with the patient to conduct this consult visit via telephone to spare the patient unnecessary potential exposure in the healthcare setting during the current COVID-19 pandemic. The patient was notified in advance and was offered a [facility] meeting to allow for face to face communication but unfortunately reported that they did not have the appropriate resources/technology to support such a visit and instead preferred to proceed with a telephone consult.     Name: Chase Herrera        MRN: [id number]  Date of Service: 11/24/2019 DOB: 12-17-1950  VE:[id number], [facility]  Derek Jack, MD     REFERRING PHYSICIAN: Derek Jack, MD   DIAGNOSIS: The primary encounter diagnosis was Small cell lung cancer [facility]). A diagnosis of Brain metastases [facility]) was also pertinent to this visit.   HISTORY OF PRESENT ILLNESS: Chase Herrera is a 69 y.o. male seen at the request of Dr. Delton Coombes for a history of small cell carcinoma of the right lung. The patient was originally diagnosed with his cancer in the summer of 2018 and received chemoRT with medical oncology at [facility] and radiotherapy at [facility]. He received prophylactic cranial irradiation between September and October 2018 as well.  He was found to have disease progression in the chest as well as a single lesion in the brain measuring approximately 6 to 7 mm along the lateral aspect of the left occipital lobe.  He began carboplatin etoposide with atezolizumab in April 2021.  He was found to have stable disease with this regimen, and repeat MRI of the brain on 07/31/2019 revealed stability and a 7 mm ring-enhancing mass at the cortex of the left occipital temporal junction.  No new disease was identified in August  2021, he switched to consolidative therapy with atezolizumab which she continues.  His MRI on 10/28/2019 to follow-up on the lesion revealed progression measuring 10 x 10 mm with mild surrounding vasogenic edema that had not previously been seen.  No new lesions were otherwise noted.  He is contacted today to discuss options of stereotactic radiosurgery.  He is scheduled for 3T MRI scan on Thursday.   PREVIOUS RADIATION THERAPY: Yes   September-October 2018: The patient received 25 Gy in 10 fraction at [facility] with Dr. Leotis Shames  05/09/16-07/12/16:  The patient received 63 Gy in 35 fractions at [facility] with Dr. Leotis Shames    PAST MEDICAL HISTORY:  Past Medical History:  Diagnosis Date  . Alcohol abuse    Quit 08/23/11  . Arthritis   . Atrial fibrillation ([place])   . Atrial flutter ([place])    CTI ablation by Dr [name] 10/2011  . Bacteremia due to Gram-positive bacteria 11/29/2016  . CHF (congestive heart failure) ([place])   . COPD (chronic obstructive pulmonary disease) ([place])   . Depression   . Essential hypertension   . History of cardiomyopathy    LVEF 25-30% 08/2011 with subsequent normalization  . History of kidney stones   . Obesity   . Prostate enlargement   . Small cell lung cancer ([place])    Right upper lobe - follows with [facility]  . Type 2 diabetes mellitus ([place])        PAST SURGICAL HISTORY: Past Surgical History:  Procedure Laterality Date  . APPENDECTOMY    . ATRIAL  ABLATION SURGERY  11/06/2011   CTI ablation for atrial flutter by Dr [name]  . ATRIAL FLUTTER ABLATION N/A 11/06/2011   Procedure: ATRIAL FLUTTER ABLATION;  Surgeon: Thompson Grayer, MD;  Location: [facility] CATH LAB;  Service: Cardiovascular;  Laterality: N/A;  . Cataracts    . COLONOSCOPY WITH PROPOFOL N/A 06/06/2017   Procedure: COLONOSCOPY WITH PROPOFOL;  Surgeon: [name], MD;  Location: [facility] ENDO SUITE;  Service: Endoscopy;  Laterality: N/A;  1:15pm  . IR GENERIC HISTORICAL  04/09/2016    IR US GUIDE VASC ACCESS RIGHT 04/09/2016 [name], DO [facility]-INTERV RAD  . IR GENERIC HISTORICAL  04/09/2016   IR FLUORO GUIDE PORT INSERTION RIGHT 04/09/2016 [name], DO [facility]-INTERV RAD  . PORT-A-CATH REMOVAL N/A 04/24/2017   Procedure: MINOR REMOVAL PORT-A-CATH;  Surgeon: [name], MD;  Location: [facility] ORS;  Service: General;  Laterality: N/A;  . PORTACATH PLACEMENT Left 04/22/2019   Procedure: INSERTION PORT-A-CATH;  Surgeon: Aviva Signs, MD;  Location: [facility] ORS;  Service: General;  Laterality: Left;  . TONSILLECTOMY       FAMILY HISTORY:  Family History  Problem Relation Age of Onset  . Diabetes Sister   . Hypertension Mother   . Lung cancer Mother   . Hypertension Father   . Heart attack Father   . Hypertension Sister   . Hypertension Sister   . Hypertension Sister   . Pulmonary fibrosis Sister   . Breast cancer Cousin   . Colon cancer Neg Hx      SOCIAL HISTORY:  reports that he quit smoking about 7 years ago. His smoking use included cigarettes. He has a 23.50 pack-year smoking history. He quit smokeless tobacco use about 5 years ago. He reports previous drug use. He reports that he does not drink alcohol. The patient is single and lives in [place], [place].    ALLERGIES: Patient has no known allergies.   MEDICATIONS:  Current Outpatient Medications  Medication Sig Dispense Refill  . acetaminophen (TYLENOL) 500 MG tablet Take 1,000 mg by mouth every 6 (six) hours as needed for moderate pain or headache.     Marland Kitchen apixaban (ELIQUIS) 5 MG TABS tablet Take 5 mg by mouth 2 (two) times daily.     Marland Kitchen AQUALANCE LANCETS 30G MISC USE TO check blood glucose twice daily  99  . Artificial Saliva (BIOTENE DRY MOUTH MOISTURIZING) SOLN Apply 1 Dose topically as needed (dry mouth).     Marland Kitchen atorvastatin (LIPITOR) 20 MG tablet Take 1 tablet (20 mg total) by mouth daily. 90 tablet 3  . benzonatate (TESSALON) 100 MG capsule Take 2 capsules (200 mg total) by mouth every 8 (eight) hours as needed.  30 capsule 1  . Carboxymethylcellul-Glycerin (LUBRICATING EYE DROPS OP) Place 1 drop into the left eye daily as needed (dry eyes).     Marland Kitchen diltiazem (DILACOR XR) 240 MG 24 hr capsule Take 240 mg by mouth daily.    Marland Kitchen dofetilide (TIKOSYN) 250 MCG capsule Take 1 capsule (250 mcg total) by mouth 2 (two) times daily.    . fluticasone (FLONASE) 50 MCG/ACT nasal spray Place 2 sprays into both nostrils daily. 16 g 6  . Fluticasone-Umeclidin-Vilant (TRELEGY ELLIPTA) 100-62.5-25 MCG/INH AEPB Inhale 1 puff into the lungs daily. 60 each 6  . furosemide (LASIX) 40 MG tablet TAKE 1 TABLET BY MOUTH EVERY DAY 90 tablet 0  . gabapentin (NEURONTIN) 300 MG capsule Take 600 mg by mouth 2 (two) times daily.     Marland Kitchen GLOBAL EASE  INJECT PEN NEEDLES 31G X 8 MM MISC Inject into the skin 3 (three) times daily.    Marland Kitchen guaifenesin (ROBITUSSIN) 100 MG/5ML syrup Take 200 mg by mouth at bedtime as needed for cough.     Marland Kitchen HM SENNA 8.6 MG tablet Take 2 tablets by mouth at bedtime.    Marland Kitchen HYDROcodone-acetaminophen (NORCO/VICODIN) 5-325 MG tablet Take 1 tablet by mouth at bedtime as needed. 30 tablet 0  . hydrOXYzine (VISTARIL) 25 MG capsule Take 25 mg by mouth every 8 (eight) hours.    . insulin lispro (HUMALOG) 100 UNIT/ML injection Inject 2-20 Units into the skin 3 (three) times daily with meals. Sliding scale     . Insulin Syringe-Needle U-100 28G X 1/2" 1 ML MISC 1 each by Does not apply route 3 (three) times daily. 100 each 3  . ipratropium-albuterol (DUONEB) 0.5-2.5 (3) MG/3ML SOLN Take 3 mLs by nebulization every 4 (four) hours as needed (shortness of breath).     Elmore Guise Devices (ADJUSTABLE LANCING DEVICE) MISC TO check blood glucose daily    . LANTUS SOLOSTAR 100 UNIT/ML Solostar Pen INJECT 20 UNITS UNDER THE SKIN EVERY MORNING AND INJECT 20 UNITS UNDER THE SKIN EVERY EVENING (Patient taking differently: Inject 20 Units into the skin 2 (two) times daily. ) 45 mL 0  . levalbuterol (XOPENEX) 0.63 MG/3ML nebulizer solution Inhale 3  mLs (0.63 mg total) into the lungs 4 (four) times daily. 3 mL 0  . Lidocaine (SALONPAS PAIN RELIEVING EX) Apply 1 patch topically daily.     Marland Kitchen lidocaine-prilocaine (EMLA) cream Apply a small amount to port a cath site and cover with plastic wrap 1 hour prior to chemotherapy appointments (Patient not taking: Reported on 11/19/2019) 30 g 3  . metFORMIN (GLUCOPHAGE) 500 MG tablet Take 500 mg by mouth in the morning and at bedtime.    . metoprolol succinate (TOPROL-XL) 100 MG 24 hr tablet Take 100 mg by mouth daily.    . Misc. Devices MISC Please provide patient with an electric wheelchair. 1 each 0  . Multiple Vitamin (MULTIVITAMIN WITH MINERALS) TABS tablet Take 1 tablet by mouth daily.    . potassium chloride (KLOR-CON) 10 MEQ tablet Take 10 mEq by mouth 2 (two) times daily.    . predniSONE (DELTASONE) 20 MG tablet Take 60 mg by mouth daily.    . pregabalin (LYRICA) 150 MG capsule Take 150 mg by mouth in the morning and at bedtime.    Marland Kitchen PROAIR HFA 108 (90 Base) MCG/ACT inhaler INHALE TWO PUFFS BY MOUTH EVERY 4 HOURS AS NEEDED FOR WHEEZING (Patient not taking: Reported on 11/19/2019) 17 g 0  . prochlorperazine (COMPAZINE) 10 MG tablet Take 1 tablet (10 mg total) by mouth every 6 (six) hours as needed (Nausea or vomiting). (Patient not taking: Reported on 11/19/2019) 60 tablet 1  . senna-docusate (SENOKOT-S) 8.6-50 MG tablet Take 2 tablets by mouth at bedtime. 60 tablet 0  . spironolactone (ALDACTONE) 25 MG tablet Take 12.5 mg by mouth 2 (two) times daily.     . tamsulosin (FLOMAX) 0.4 MG CAPS capsule Take 1 capsule (0.4 mg total) by mouth 2 (two) times daily after a meal. 180 capsule 1  . traZODone (DESYREL) 50 MG tablet Take 50 mg by mouth at bedtime.  5   No current facility-administered medications for this encounter.     REVIEW OF SYSTEMS: On review of systems, the patient reports that he is doing well overall. He denies any concerns with headaches or visual  disturbances. He does have an  unsteady gait but no dizziness and feels that his gait is due to peripheral neuropathy which he has in his hands and feet. He  denies any chest pain, shortness of breath, cough, fevers. He verbalizes a need for transportation assistance. No other complaints are noted.    PHYSICAL EXAM:  Unable to assess due to encounter type.   ECOG = 0  0 - Asymptomatic (Fully active, able to carry on all predisease activities without restriction)  1 - Symptomatic but completely ambulatory (Restricted in physically strenuous activity but ambulatory and able to carry out work of a light or sedentary nature. For example, light housework, office work)  2 - Symptomatic, <50% in bed during the day (Ambulatory and capable of all self care but unable to carry out any work activities. Up and about more than 50% of waking hours)  3 - Symptomatic, >50% in bed, but not bedbound (Capable of only limited self-care, confined to bed or chair 50% or more of waking hours)  4 - Bedbound (Completely disabled. Cannot carry on any self-care. Totally confined to bed or chair)  5 - Death   Eustace Pen MM, Creech RH, Tormey DC, et al. [phone]). "Toxicity and response criteria of the [facility]". [place] Oncol. 5 (6): 649-55    LABORATORY DATA:  Lab Results  Component Value Date   WBC 6.4 11/19/2019   HGB 12.6 (L) 11/19/2019   HCT 40.8 11/19/2019   MCV 103.0 (H) 11/19/2019   PLT 142 (L) 11/19/2019   Lab Results  Component Value Date   NA 133 (L) 11/19/2019   K 4.9 11/19/2019   CL 95 (L) 11/19/2019   CO2 28 11/19/2019   Lab Results  Component Value Date   ALT 33 11/19/2019   AST 25 11/19/2019   ALKPHOS 62 11/19/2019   BILITOT 0.6 11/19/2019      RADIOGRAPHY: MR Brain W Wo Contrast  Result Date: 10/28/2019 CLINICAL DATA:  Small cell lung cancer. Evaluate treatment response. EXAM: MRI HEAD WITHOUT AND WITH CONTRAST TECHNIQUE: Multiplanar, multiecho pulse sequences of the brain and  surrounding structures were obtained without and with intravenous contrast. CONTRAST:  72mL GADAVIST GADOBUTROL 1 MMOL/ML IV SOLN COMPARISON:  MRI of the brain July 31, 2019. FINDINGS: The study is degraded by motion, particularly the postcontrast images. Brain: Rim enhancing lesion within the left occipital temporal junction measuring 10 x 10 mm compared to 7 x 6 mm on prior. There is mild surrounding vasogenic edema, progressed from prior MRI. No new lesion identified. No acute infarction, hemorrhage, hydrocephalus or extra-axial collection. Scattered and confluent foci of T2 hyperintensity is seen within the white matter of the cerebral hemispheres, unchanged from prior. Vascular: Normal flow voids. Skull and upper cervical spine: Normal marrow signal. Sinuses/Orbits: Mucous retention cysts in the bilateral maxillary sinuses. Bilateral lens surgery. Other: Left mastoid effusion. IMPRESSION: Mild interval increase in size of rim enhancing lesion within the left occipital temporal junction, now measuring 10 x 10 mm compared to 7 x 6 mm on prior MRI. No new lesion identified. Electronically Signed   By: Pedro Earls M.D.   On: 10/28/2019 13:41       IMPRESSION/PLAN: 1. Progressive Metastatic Limited Stage Small Cell Carcinoma of the right lung with brain disease. Dr. Lisbeth Renshaw discusses the pathology findings and reviews the nature of recurrent metastatic disease of the CNS. Dr. Lisbeth Renshaw discusses the rationale for stereotactic radiosurgery [facility]) as the preferred modality  of his treatment. We reviewed the rationale for a 3T MRI which he's scheduled for on 11/26/19 for  diagnostic and planning purposes. We discussed the risks, benefits, short, and long term effects of radiotherapy, and the patient is interested in proceeding. Dr. Lisbeth Renshaw discusses the delivery and logistics of radiotherapy and anticipates a single fraction of radiotherapy. He is scheduled for simulation on 11/30/19 and subsequent treatment  on 12/04/19. He will continue plans with Dr. Delton Coombes for H[facility] Bienenstock as his systemic disease has been improved by this regimen.  Given current concerns for patient exposure during the COVID-19 pandemic, this encounter was conducted via telephone.  The patient has provided two factor identification and has given verbal consent for this type of encounter and has been advised to only accept a meeting of this type in a secure network environment. The time spent during this encounter was 60 minutes including preparation, discussion, and coordination of the patient's care. The attendants for this meeting include Mont Dutton, RT, Blenda Nicely, RN, Dr. Lisbeth Renshaw, Hayden Pedro  and Baker Janus.  During the encounter, Mont Dutton, RT,  Blenda Nicely, RN, Dr. Lisbeth Renshaw, and Hayden Pedro were located at [facility] Radiation Oncology Department.  NICHOLOS ALOISI was located at home.   The above documentation reflects my direct findings during this shared patient visit. Please see the separate note by Dr. Lisbeth Renshaw on this date for the remainder of the patient's plan of care.    Carola Rhine, PAC

## 2019-11-25 ENCOUNTER — Telehealth: Payer: Self-pay

## 2019-11-25 NOTE — Telephone Encounter (Signed)
   Chase Herrera DOB: May 16, 1950 MRN: [id number]   RIDER WAIVER AND RELEASE OF LIABILITY  For purposes of improving physical access to our facilities, [facility] is pleased to partner with third parties to provide [place] patients or other authorized individuals the option of convenient, on-demand ground transportation services (the [place]") through use of the technology service that enables users to request on-demand ground transportation from independent third-party providers.  By opting to use and accept these [employer], I, the undersigned, hereby agree on behalf of myself, and on behalf of any minor child using the [employer] for whom I am the parent or legal guardian, as follows:  1. Government social research officer provided to me are provided by independent third-party transportation providers who are not [employer] or employees and who are unaffiliated with [employer]. 2. [facility] is neither a transportation carrier nor a common or public carrier. 3. [facility] has no control over the quality or safety of the transportation that occurs as a result of the [employer]. 4. [facility] cannot guarantee that any third-party transportation provider will complete any arranged transportation service. 5. [facility] makes no representation, warranty, or guarantee regarding the reliability, timeliness, quality, safety, suitability, or availability of any of the Transport Services or that they will be error free. 6. I fully understand that traveling by vehicle involves risks and dangers of serious bodily injury, including permanent disability, paralysis, and death. I agree, on behalf of myself and on behalf of any minor child using the Transport Services for whom I am the parent or legal guardian, that the entire risk arising out of my use of the [employer] remains solely with me, to the maximum extent permitted under applicable law. 7. The [employer] are provided "as is" and "as available." [facility] disclaims all representations and warranties, express, implied or statutory, not expressly set out in these terms, including the implied warranties of merchantability and fitness for a particular purpose. 8. I hereby waive and release [facility], its agents, employees, officers, directors, representatives, insurers, attorneys, assigns, successors, subsidiaries, and affiliates from any and all past, present, or future claims, demands, liabilities, actions, causes of action, or suits of any kind directly or indirectly arising from acceptance and use of the [employer]. 9. I further waive and release [facility] and its affiliates from all present and future liability and responsibility for any injury or death to persons or damages to property caused by or related to the use of the [employer]. 10. I have read this Waiver and Release of Liability, and I understand the terms used in it and their legal significance. This Waiver is freely and voluntarily given with the understanding that my right (as well as the right of any minor child for whom I am the parent or legal guardian using the [employer]) to legal recourse against [place] in connection with the [employer] is knowingly surrendered in return for use of these services.   I attest that I read the consent document to Baker Janus, gave Mr. Ernsberger the opportunity to ask questions and answered the questions asked (if any). I affirm that Baker Janus then provided consent for he's participation in this program.     Chase Herrera

## 2019-11-26 ENCOUNTER — Ambulatory Visit
Admission: RE | Admit: 2019-11-26 | Discharge: 2019-11-26 | Disposition: A | Discharge status: Home / Self Care | Payer: Medicare Other | Source: Ambulatory Visit | Attending: Radiation Oncology | Admitting: Radiation Oncology

## 2019-11-26 ENCOUNTER — Inpatient Hospital Stay: Admission: RE | Admit: 2019-11-26 | Payer: Medicare Other | Source: Ambulatory Visit

## 2019-11-26 ENCOUNTER — Other Ambulatory Visit: Payer: Self-pay

## 2019-11-26 DIAGNOSIS — C7949 Secondary malignant neoplasm of other parts of nervous system: Secondary | ICD-10-CM

## 2019-11-26 DIAGNOSIS — C7931 Secondary malignant neoplasm of brain: Secondary | ICD-10-CM

## 2019-11-26 MED ORDER — GADOBENATE DIMEGLUMINE 529 MG/ML IV SOLN
20.0000 mL | Freq: Once | INTRAVENOUS | Status: AC | PRN
  Administered 2019-11-26: 20 mL via INTRAVENOUS

## 2019-11-30 ENCOUNTER — Ambulatory Visit
Admission: RE | Admit: 2019-11-30 | Discharge: 2019-11-30 | Disposition: A | Discharge status: Home / Self Care | Payer: Medicare Other | Source: Ambulatory Visit | Attending: Radiation Oncology | Admitting: Radiation Oncology

## 2019-11-30 ENCOUNTER — Other Ambulatory Visit: Payer: Self-pay | Admitting: Radiation Oncology

## 2019-11-30 ENCOUNTER — Other Ambulatory Visit: Payer: Self-pay

## 2019-11-30 VITALS — BP 116/59 | HR 68 | Temp 97.0°F | Resp 18 | Ht 73.0 in

## 2019-11-30 DIAGNOSIS — C7931 Secondary malignant neoplasm of brain: Secondary | ICD-10-CM | POA: Insufficient documentation

## 2019-11-30 DIAGNOSIS — C3490 Malignant neoplasm of unspecified part of unspecified bronchus or lung: Secondary | ICD-10-CM

## 2019-11-30 DIAGNOSIS — C3411 Malignant neoplasm of upper lobe, right bronchus or lung: Secondary | ICD-10-CM | POA: Diagnosis present

## 2019-11-30 MED ORDER — HEPARIN SOD (PORK) LOCK FLUSH 100 UNIT/ML IV SOLN
500.0000 [IU] | Freq: Once | INTRAVENOUS | Status: AC
  Administered 2019-11-30: 500 [IU] via INTRAVENOUS

## 2019-11-30 MED ORDER — SODIUM CHLORIDE 0.9% FLUSH
10.0000 mL | Freq: Once | INTRAVENOUS | Status: AC
  Administered 2019-11-30: 10 mL via INTRAVENOUS

## 2019-11-30 NOTE — Progress Notes (Signed)
Has armband been applied?  Yes  Does patient have an allergy to IV contrast dye?: No   Has patient ever received premedication for IV contrast dye?: No  Does patient take metformin?: Yes  If patient does take metformin when was the last dose: 11/30/2019-Ok to proceed with contrast today at half dose per Bryson Ha PA  Date of lab work: 11/19/2019 BUN: 17 CR: 1.03 eGfr: >60  IV site: Left Chest  Has IV site been added to flowsheet?  Yes  BP (!) 116/59 (BP Location: Right Arm, Patient Position: Sitting)   Pulse 68   Temp (!) 97 F (36.1 C) (Temporal)   Resp 18   Ht _0  (1.854 m)   SpO2 96%   BMI 32.19 kg/m

## 2019-12-02 ENCOUNTER — Inpatient Hospital Stay: Admission: RE | Admit: 2019-12-02 | Payer: Medicare Other | Source: Ambulatory Visit

## 2019-12-03 DIAGNOSIS — C3411 Malignant neoplasm of upper lobe, right bronchus or lung: Secondary | ICD-10-CM | POA: Diagnosis not present

## 2019-12-04 ENCOUNTER — Other Ambulatory Visit: Payer: Self-pay

## 2019-12-04 ENCOUNTER — Encounter: Payer: Self-pay | Admitting: Radiation Oncology

## 2019-12-04 ENCOUNTER — Ambulatory Visit
Admission: RE | Admit: 2019-12-04 | Discharge: 2019-12-04 | Disposition: A | Discharge status: Home / Self Care | Payer: Medicare Other | Source: Ambulatory Visit | Attending: Radiation Oncology | Admitting: Radiation Oncology

## 2019-12-04 DIAGNOSIS — C3411 Malignant neoplasm of upper lobe, right bronchus or lung: Secondary | ICD-10-CM | POA: Diagnosis not present

## 2019-12-04 DIAGNOSIS — C3490 Malignant neoplasm of unspecified part of unspecified bronchus or lung: Secondary | ICD-10-CM

## 2019-12-04 LAB — BUN & CREATININE (CHCC)
BUN: 18 mg/dL (ref 8–23)
Creatinine: 1.31 mg/dL — ABNORMAL HIGH (ref 0.61–1.24)
GFR, Estimated: 60 mL/min — ABNORMAL LOW (ref >60)

## 2019-12-04 NOTE — Op Note (Signed)
Name: Chase Herrera    MRN: [id number]   Date: 12/04/2019    DOB: Jun 27, 1950   STEREOTACTIC RADIOSURGERY OPERATIVE NOTE  PRE-OPERATIVE DIAGNOSIS:  Metastatic CNS disease  POST-OPERATIVE DIAGNOSIS:  Metastatic CNS disease  PROCEDURE:   1. Stereotactic Radiosurgery to left posterior temporal/occipital lesion using TrueBeam Linac device 2. Stereotactic Radiosurgery to basal ganglia lesion using TrueBeam Linac device, additional lesion  SURGEON:  Duffy Rhody, MD  RADIATION ONCOLOGIST: Kyung Rudd, MD  TECHNIQUE:  The patient underwent a radiation treatment planning session in the radiation oncology simulation suite under the care of the radiation oncology physician and physicist.  I participated closely in the radiation treatment planning afterwards. The patient underwent planning CT which was fused to 3T high resolution MRI with 1 mm axial slices.  These images were fused on the planning system.  We contoured the gross target volumes and subsequently expanded this by 1 mm to yield the Planning Target Volume for the two lesions. I actively participated in the planning process.  I helped to define and review the target contours and also the contours of the optic pathway, eyes, brainstem and selected nearby organs at risk.  All the dose constraints for critical structures were reviewed and compared to AAPM Task Group 101.  The prescription dose conformity was reviewed.  I approved the plan electronically.    Accordingly, Chase Herrera  was brought to the TrueBeam stereotactic radiation treatment linac and placed in the custom immobilization mask.  The patient was aligned according to the IR fiducial markers with BrainLab Exactrac, then orthogonal x-rays were used in ExacTrac with the 6DOF robotic table and the shifts were made to align the patient  Chase Herrera received stereotactic radiosurgery to a prescription dose of 20 Gy uneventfully to both lesions The detailed description of the procedure  is recorded in the radiation oncology procedure note.  I was present for the duration of the procedure.  DISPOSITION:   Following delivery, the patient was transported to nursing in stable condition and monitored for possible acute effects to be discharged to home in stable condition with follow-up in one month.  Duffy Rhody, MD [facility]

## 2019-12-04 NOTE — Progress Notes (Signed)
Chase Herrera rested with [name] for 30 minutes following his SRS treatment.  Patient denies headache, dizziness, nausea, diplopia or ringing in the ears. Denies fatigue. Patient without complaints. Understands to avoid strenuous activity for the next 24 hours and call [phone] with needs.   BP 129/72 (BP Location: Left Arm, Patient Position: Sitting, Cuff Size: Large)   Pulse 80   Temp 97.7 F (36.5 C)   Resp 20   SpO2 95%    [name] M. Leonie Green, BSN

## 2019-12-07 ENCOUNTER — Telehealth: Payer: Self-pay | Admitting: Radiation Therapy

## 2019-12-07 NOTE — Telephone Encounter (Signed)
Called to check in on Chase Herrera since completing his brain [place] on Friday and to follow-up with him about his lab results as well. There was no answer on the home line and the voicemail box is not set up on his mobile, so I was not able to reach him. Shona Simpson PA-C, has recommended that he follow-up with his PCP in regards to restarting the Metformin based on his elevated lab results. I try again later to get in touch with the patient.    Mont Dutton R.T.(R)(T) Radiation Special Procedures Navigator

## 2019-12-07 NOTE — Telephone Encounter (Signed)
Called the patient's PCP office, Dr. Leonie Douglas, [phone], requesting they review recent BUN and Creat levels and share recommendations with the patient of when to resume taking his Metformin.   Mont Dutton R.T.(R)(T) Radiation Special Procedures Navigator

## 2019-12-08 ENCOUNTER — Telehealth: Payer: Self-pay | Admitting: Radiation Therapy

## 2019-12-08 NOTE — Telephone Encounter (Addendum)
I tried calling Mr. Lauver again this morning to check in and see if he has heard from his PCP regarding restarting his metformin. Still no answer on the home line and the cell does not have a voicemail set up, so I was not able to leave a message.   Mont Dutton R.T.(R)(T) Radiation Special Procedures Navigator     ** Pt called back shortly after this call was placed. He said that he is doing fine, no issues or complaints since the radiation treatment on Friday. He also said that he had forgotten to hold the metformin, but his PCP did call him back yesterday to tell him it was safe to resume.  Mont Dutton R.T.(R)(T) Radiation Special Procedures Navigator

## 2019-12-10 ENCOUNTER — Inpatient Hospital Stay (HOSPITAL_COMMUNITY): Payer: Medicare Other | Admitting: Hematology

## 2019-12-10 ENCOUNTER — Inpatient Hospital Stay (HOSPITAL_COMMUNITY): Payer: Medicare Other

## 2019-12-10 ENCOUNTER — Inpatient Hospital Stay (HOSPITAL_COMMUNITY): Payer: Medicare Other | Attending: Hematology

## 2019-12-10 DIAGNOSIS — Z87891 Personal history of nicotine dependence: Secondary | ICD-10-CM | POA: Insufficient documentation

## 2019-12-10 DIAGNOSIS — R609 Edema, unspecified: Secondary | ICD-10-CM | POA: Insufficient documentation

## 2019-12-10 DIAGNOSIS — E1165 Type 2 diabetes mellitus with hyperglycemia: Secondary | ICD-10-CM | POA: Insufficient documentation

## 2019-12-10 DIAGNOSIS — I4891 Unspecified atrial fibrillation: Secondary | ICD-10-CM | POA: Insufficient documentation

## 2019-12-10 DIAGNOSIS — M545 Low back pain, unspecified: Secondary | ICD-10-CM | POA: Insufficient documentation

## 2019-12-10 DIAGNOSIS — E1122 Type 2 diabetes mellitus with diabetic chronic kidney disease: Secondary | ICD-10-CM | POA: Insufficient documentation

## 2019-12-10 DIAGNOSIS — Z803 Family history of malignant neoplasm of breast: Secondary | ICD-10-CM | POA: Insufficient documentation

## 2019-12-10 DIAGNOSIS — R200 Anesthesia of skin: Secondary | ICD-10-CM | POA: Insufficient documentation

## 2019-12-10 DIAGNOSIS — R059 Cough, unspecified: Secondary | ICD-10-CM | POA: Insufficient documentation

## 2019-12-10 DIAGNOSIS — C7949 Secondary malignant neoplasm of other parts of nervous system: Secondary | ICD-10-CM | POA: Insufficient documentation

## 2019-12-10 DIAGNOSIS — Z5112 Encounter for antineoplastic immunotherapy: Secondary | ICD-10-CM | POA: Insufficient documentation

## 2019-12-10 DIAGNOSIS — Z79899 Other long term (current) drug therapy: Secondary | ICD-10-CM | POA: Insufficient documentation

## 2019-12-10 DIAGNOSIS — R5383 Other fatigue: Secondary | ICD-10-CM | POA: Insufficient documentation

## 2019-12-10 DIAGNOSIS — R7301 Impaired fasting glucose: Secondary | ICD-10-CM | POA: Insufficient documentation

## 2019-12-10 DIAGNOSIS — N189 Chronic kidney disease, unspecified: Secondary | ICD-10-CM | POA: Insufficient documentation

## 2019-12-10 DIAGNOSIS — Z833 Family history of diabetes mellitus: Secondary | ICD-10-CM | POA: Insufficient documentation

## 2019-12-10 DIAGNOSIS — Z836 Family history of other diseases of the respiratory system: Secondary | ICD-10-CM | POA: Insufficient documentation

## 2019-12-10 DIAGNOSIS — Z8249 Family history of ischemic heart disease and other diseases of the circulatory system: Secondary | ICD-10-CM | POA: Insufficient documentation

## 2019-12-10 DIAGNOSIS — J449 Chronic obstructive pulmonary disease, unspecified: Secondary | ICD-10-CM | POA: Insufficient documentation

## 2019-12-10 DIAGNOSIS — C7931 Secondary malignant neoplasm of brain: Secondary | ICD-10-CM | POA: Insufficient documentation

## 2019-12-10 DIAGNOSIS — C3411 Malignant neoplasm of upper lobe, right bronchus or lung: Secondary | ICD-10-CM | POA: Insufficient documentation

## 2019-12-10 DIAGNOSIS — Z801 Family history of malignant neoplasm of trachea, bronchus and lung: Secondary | ICD-10-CM | POA: Insufficient documentation

## 2019-12-15 ENCOUNTER — Inpatient Hospital Stay (HOSPITAL_COMMUNITY): Payer: Medicare Other

## 2019-12-15 ENCOUNTER — Encounter (HOSPITAL_COMMUNITY): Payer: Self-pay

## 2019-12-15 ENCOUNTER — Other Ambulatory Visit: Payer: Self-pay

## 2019-12-15 VITALS — BP 115/60 | HR 74 | Temp 97.5°F | Resp 18

## 2019-12-15 DIAGNOSIS — M545 Low back pain, unspecified: Secondary | ICD-10-CM | POA: Diagnosis not present

## 2019-12-15 DIAGNOSIS — C3491 Malignant neoplasm of unspecified part of right bronchus or lung: Secondary | ICD-10-CM

## 2019-12-15 DIAGNOSIS — J449 Chronic obstructive pulmonary disease, unspecified: Secondary | ICD-10-CM | POA: Diagnosis not present

## 2019-12-15 DIAGNOSIS — Z79899 Other long term (current) drug therapy: Secondary | ICD-10-CM | POA: Diagnosis not present

## 2019-12-15 DIAGNOSIS — Z833 Family history of diabetes mellitus: Secondary | ICD-10-CM | POA: Diagnosis not present

## 2019-12-15 DIAGNOSIS — R7301 Impaired fasting glucose: Secondary | ICD-10-CM | POA: Diagnosis not present

## 2019-12-15 DIAGNOSIS — R059 Cough, unspecified: Secondary | ICD-10-CM | POA: Diagnosis not present

## 2019-12-15 DIAGNOSIS — Z8249 Family history of ischemic heart disease and other diseases of the circulatory system: Secondary | ICD-10-CM | POA: Diagnosis not present

## 2019-12-15 DIAGNOSIS — Z803 Family history of malignant neoplasm of breast: Secondary | ICD-10-CM | POA: Diagnosis not present

## 2019-12-15 DIAGNOSIS — E1122 Type 2 diabetes mellitus with diabetic chronic kidney disease: Secondary | ICD-10-CM | POA: Diagnosis not present

## 2019-12-15 DIAGNOSIS — Z801 Family history of malignant neoplasm of trachea, bronchus and lung: Secondary | ICD-10-CM | POA: Diagnosis not present

## 2019-12-15 DIAGNOSIS — Z836 Family history of other diseases of the respiratory system: Secondary | ICD-10-CM | POA: Diagnosis not present

## 2019-12-15 DIAGNOSIS — Z5112 Encounter for antineoplastic immunotherapy: Secondary | ICD-10-CM | POA: Diagnosis not present

## 2019-12-15 DIAGNOSIS — C3411 Malignant neoplasm of upper lobe, right bronchus or lung: Secondary | ICD-10-CM | POA: Diagnosis present

## 2019-12-15 DIAGNOSIS — R609 Edema, unspecified: Secondary | ICD-10-CM | POA: Diagnosis not present

## 2019-12-15 DIAGNOSIS — C7949 Secondary malignant neoplasm of other parts of nervous system: Secondary | ICD-10-CM | POA: Diagnosis not present

## 2019-12-15 DIAGNOSIS — R739 Hyperglycemia, unspecified: Secondary | ICD-10-CM

## 2019-12-15 DIAGNOSIS — E1165 Type 2 diabetes mellitus with hyperglycemia: Secondary | ICD-10-CM | POA: Diagnosis not present

## 2019-12-15 DIAGNOSIS — N189 Chronic kidney disease, unspecified: Secondary | ICD-10-CM | POA: Diagnosis not present

## 2019-12-15 DIAGNOSIS — I4891 Unspecified atrial fibrillation: Secondary | ICD-10-CM | POA: Diagnosis not present

## 2019-12-15 DIAGNOSIS — Z87891 Personal history of nicotine dependence: Secondary | ICD-10-CM | POA: Diagnosis not present

## 2019-12-15 DIAGNOSIS — R200 Anesthesia of skin: Secondary | ICD-10-CM | POA: Diagnosis not present

## 2019-12-15 DIAGNOSIS — R5383 Other fatigue: Secondary | ICD-10-CM | POA: Diagnosis not present

## 2019-12-15 DIAGNOSIS — C7931 Secondary malignant neoplasm of brain: Secondary | ICD-10-CM | POA: Diagnosis not present

## 2019-12-15 LAB — CBC WITH DIFFERENTIAL/PLATELET
Abs Immature Granulocytes: 0.08 10*3/uL — ABNORMAL HIGH (ref 0.00–0.07)
Basophils Absolute: 0 10*3/uL (ref 0.0–0.1)
Basophils Relative: 0 %
Eosinophils Absolute: 0 10*3/uL (ref 0.0–0.5)
Eosinophils Relative: 0 %
HCT: 43.9 % (ref 39.0–52.0)
Hemoglobin: 13.5 g/dL (ref 13.0–17.0)
Immature Granulocytes: 1 %
Lymphocytes Relative: 5 %
Lymphs Abs: 0.4 10*3/uL — ABNORMAL LOW (ref 0.7–4.0)
MCH: 30.9 pg (ref 26.0–34.0)
MCHC: 30.8 g/dL (ref 30.0–36.0)
MCV: 100.5 fL — ABNORMAL HIGH (ref 80.0–100.0)
Monocytes Absolute: 0.6 10*3/uL (ref 0.1–1.0)
Monocytes Relative: 8 %
Neutro Abs: 6 10*3/uL (ref 1.7–7.7)
Neutrophils Relative %: 86 %
Platelets: 120 10*3/uL — ABNORMAL LOW (ref 150–400)
RBC: 4.37 MIL/uL (ref 4.22–5.81)
RDW: 14 % (ref 11.5–15.5)
WBC: 7.1 10*3/uL (ref 4.0–10.5)
nRBC: 0 % (ref 0.0–0.2)

## 2019-12-15 LAB — COMPREHENSIVE METABOLIC PANEL
ALT: 29 U/L (ref 0–44)
AST: 17 U/L (ref 15–41)
Albumin: 3.5 g/dL (ref 3.5–5.0)
Alkaline Phosphatase: 54 U/L (ref 38–126)
Anion gap: 11 (ref 5–15)
BUN: 26 mg/dL — ABNORMAL HIGH (ref 8–23)
CO2: 30 mmol/L (ref 22–32)
Calcium: 8.8 mg/dL — ABNORMAL LOW (ref 8.9–10.3)
Chloride: 94 mmol/L — ABNORMAL LOW (ref 98–111)
Creatinine, Ser: 1.15 mg/dL (ref 0.61–1.24)
GFR, Estimated: >60 mL/min (ref >60)
Glucose, Bld: 342 mg/dL — ABNORMAL HIGH (ref 70–99)
Potassium: 3.7 mmol/L (ref 3.5–5.1)
Sodium: 135 mmol/L (ref 135–145)
Total Bilirubin: 0.6 mg/dL (ref 0.3–1.2)
Total Protein: 6.3 g/dL — ABNORMAL LOW (ref 6.5–8.1)

## 2019-12-15 LAB — TSH: TSH: 4.684 u[IU]/mL — ABNORMAL HIGH (ref 0.350–4.500)

## 2019-12-15 MED ORDER — INSULIN ASPART 100 UNIT/ML ~~LOC~~ SOLN
10.0000 [IU] | Freq: Once | SUBCUTANEOUS | Status: AC
  Administered 2019-12-15: 10 [IU] via INTRAVENOUS
  Filled 2019-12-15: qty 0.1

## 2019-12-15 MED ORDER — HEPARIN SOD (PORK) LOCK FLUSH 100 UNIT/ML IV SOLN
500.0000 [IU] | Freq: Once | INTRAVENOUS | Status: AC | PRN
  Administered 2019-12-15: 500 [IU]

## 2019-12-15 MED ORDER — SODIUM CHLORIDE 0.9 % IV SOLN: Freq: Once | INTRAVENOUS | Status: AC

## 2019-12-15 MED ORDER — SODIUM CHLORIDE 0.9 % IV SOLN
1200.0000 mg | Freq: Once | INTRAVENOUS | Status: AC
  Administered 2019-12-15: 1200 mg via INTRAVENOUS
  Filled 2019-12-15: qty 20

## 2019-12-15 MED ORDER — SODIUM CHLORIDE 0.9% FLUSH
10.0000 mL | INTRAVENOUS | Status: DC | PRN
  Administered 2019-12-15: 10 mL

## 2019-12-15 NOTE — Progress Notes (Signed)
Pt here for D1C10 for tecentriq.  Labs reviewed with Dr Raliegh Ip, glucose 342. 10 units of insulin given.  Okay to proceed with treatment.  Tolerated treatment well today without incidence. Discharged ambulatory in stable condition via wheelchair.  Vital signs stable prior to discharge.

## 2019-12-15 NOTE — Progress Notes (Signed)
Note on dose of insulin:  give 10 units insulin IV. ok for tx from Dr Delton Coombes  Novolog 10 units IV entered.  T.O. Dr Rhys Martini, PharmD

## 2019-12-15 NOTE — Patient Instructions (Signed)
[facility] Discharge Instructions for Patients Receiving Chemotherapy  Today you received the following chemotherapy agents   To help prevent nausea and vomiting after your treatment, we encourage you to take your nausea medication   If you develop nausea and vomiting that is not controlled by your nausea medication, call the clinic.   BELOW ARE SYMPTOMS THAT SHOULD BE REPORTED IMMEDIATELY:  *FEVER GREATER THAN 100.5 F  *CHILLS WITH OR WITHOUT FEVER  NAUSEA AND VOMITING THAT IS NOT CONTROLLED WITH YOUR NAUSEA MEDICATION  *UNUSUAL SHORTNESS OF BREATH  *UNUSUAL BRUISING OR BLEEDING  TENDERNESS IN MOUTH AND THROAT WITH OR WITHOUT PRESENCE OF ULCERS  *URINARY PROBLEMS  *BOWEL PROBLEMS  UNUSUAL RASH Items with * indicate a potential emergency and should be followed up as soon as possible.  Feel free to call the clinic should you have any questions or concerns. The clinic phone number is [phone].  Please show the CHEMO ALERT CARD at check-in to the Emergency Department and triage nurse.   

## 2019-12-23 ENCOUNTER — Ambulatory Visit: Payer: Medicare Other | Admitting: Cardiology

## 2019-12-30 ENCOUNTER — Encounter: Payer: Self-pay | Admitting: Cardiology

## 2019-12-30 ENCOUNTER — Ambulatory Visit (INDEPENDENT_AMBULATORY_CARE_PROVIDER_SITE_OTHER): Payer: Medicare Other | Admitting: Cardiology

## 2019-12-30 VITALS — BP 124/78 | HR 73 | Ht 73.0 in | Wt 247.0 lb

## 2019-12-30 DIAGNOSIS — I4819 Other persistent atrial fibrillation: Secondary | ICD-10-CM

## 2019-12-30 DIAGNOSIS — I428 Other cardiomyopathies: Secondary | ICD-10-CM

## 2019-12-30 NOTE — Patient Instructions (Signed)
Your physician wants you to follow-up in: [place]   Your physician recommends that you continue on your current medications as directed. Please refer to the Current Medication list given to you today.  Thank you for choosing [facility]!!

## 2019-12-30 NOTE — Progress Notes (Signed)
Cardiology Office Note  Date: 12/30/2019   ID: Chase, Herrera [birth year], MRN [id number]  PCP:  Gwenlyn Saran, [place]  Cardiologist:  Chase Lesches, MD Electrophysiologist:  None   Chief Complaint  Patient presents with  . Cardiac follow-up    History of Present Illness: Chase Herrera is a 69 y.o. male last seen in July by [name].  He presents for a routine visit.  From a cardiac perspective, reports no sense of palpitations and states that he has tolerated his current medications well.  He continues to follow with Pulmonary with underlying COPD, also continues on treatments for lung cancer with Oncology.  He is in a wheelchair today wearing supplemental oxygen.  Functionally limited at baseline.  I reviewed his recent lab work as outlined below.  He does not report any spontaneous bleeding problems on Eliquis.  Hemoglobin was 14.0, creatinine 1.27.  Echocardiogram done at [facility] back in January of this year revealed LVEF 45 to 50% range.  Past Medical History:  Diagnosis Date  . Alcohol abuse    Quit 08/23/11  . Arthritis   . Atrial fibrillation ([place])   . Atrial flutter ([place])    CTI ablation by Dr Chase Herrera 10/2011  . Bacteremia due to Gram-positive bacteria 11/29/2016  . CHF (congestive heart failure) ([place])   . COPD (chronic obstructive pulmonary disease) ([place])   . Depression   . Essential hypertension   . History of cardiomyopathy    LVEF 25-30% 08/2011 with subsequent normalization  . History of kidney stones   . Obesity   . Prostate enlargement   . Small cell lung cancer ([place])    Right upper lobe - follows with Chase Herrera  . Type 2 diabetes mellitus ([place])     Past Surgical History:  Procedure Laterality Date  . APPENDECTOMY    . ATRIAL ABLATION SURGERY  11/06/2011   CTI ablation for atrial flutter by Dr Chase Herrera  . ATRIAL FLUTTER ABLATION N/A 11/06/2011   Procedure: ATRIAL FLUTTER ABLATION;  Surgeon: Chase Grayer, MD;  Location: [facility]  CATH LAB;  Service: Cardiovascular;  Laterality: N/A;  . Cataracts    . COLONOSCOPY WITH PROPOFOL N/A 06/06/2017   Procedure: COLONOSCOPY WITH PROPOFOL;  Surgeon: Chase Dolin, MD;  Location: [facility] ENDO SUITE;  Service: Endoscopy;  Laterality: N/A;  1:15pm  . IR GENERIC HISTORICAL  04/09/2016   IR US GUIDE VASC ACCESS RIGHT 04/09/2016 [name], DO [facility]-INTERV RAD  . IR GENERIC HISTORICAL  04/09/2016   IR FLUORO GUIDE PORT INSERTION RIGHT 04/09/2016 [name], DO [facility]-INTERV RAD  . PORT-A-CATH REMOVAL N/A 04/24/2017   Procedure: MINOR REMOVAL PORT-A-CATH;  Surgeon: Chase Cagey, MD;  Location: [facility] ORS;  Service: General;  Laterality: N/A;  . PORTACATH PLACEMENT Left 04/22/2019   Procedure: INSERTION PORT-A-CATH;  Surgeon: Chase Signs, MD;  Location: [facility] ORS;  Service: General;  Laterality: Left;  . TONSILLECTOMY      Current Outpatient Medications  Medication Sig Dispense Refill  . acetaminophen (TYLENOL) 500 MG tablet Take 1,000 mg by mouth every 6 (six) hours as needed for moderate pain or headache.     Marland Kitchen apixaban (ELIQUIS) 5 MG TABS tablet Take 5 mg by mouth 2 (two) times daily.     Marland Kitchen AQUALANCE LANCETS 30G MISC USE TO check blood glucose twice daily  99  . Artificial Saliva (BIOTENE DRY MOUTH MOISTURIZING) SOLN Apply 1 Dose topically as needed (dry mouth).     Marland Kitchen atorvastatin (  LIPITOR) 20 MG tablet Take 1 tablet (20 mg total) by mouth daily. 90 tablet 3  . benzonatate (TESSALON) 100 MG capsule Take 2 capsules (200 mg total) by mouth every 8 (eight) hours as needed. 30 capsule 1  . Carboxymethylcellul-Glycerin (LUBRICATING EYE DROPS OP) Place 1 drop into the left eye daily as needed (dry eyes).     Marland Kitchen diltiazem (DILACOR XR) 240 MG 24 hr capsule Take 240 mg by mouth daily.    Marland Kitchen dofetilide (TIKOSYN) 250 MCG capsule Take 1 capsule (250 mcg total) by mouth 2 (two) times daily.    . fluticasone (FLONASE) 50 MCG/ACT nasal spray Place 2 sprays into both nostrils daily. 16 g 6  .  Fluticasone-Umeclidin-Vilant (TRELEGY ELLIPTA) 100-62.5-25 MCG/INH AEPB Inhale 1 puff into the lungs daily. 60 each 6  . furosemide (LASIX) 40 MG tablet TAKE 1 TABLET BY MOUTH EVERY DAY 90 tablet 0  . gabapentin (NEURONTIN) 300 MG capsule Take 600 mg by mouth 2 (two) times daily.     Marland Kitchen GLOBAL EASE INJECT PEN NEEDLES 31G X 8 MM MISC Inject into the skin 3 (three) times daily.    Marland Kitchen guaifenesin (ROBITUSSIN) 100 MG/5ML syrup Take 200 mg by mouth at bedtime as needed for cough.     Marland Kitchen HM SENNA 8.6 MG tablet Take 2 tablets by mouth at bedtime.    Marland Kitchen HYDROcodone-acetaminophen (NORCO/VICODIN) 5-325 MG tablet Take 1 tablet by mouth at bedtime as needed. 30 tablet 0  . insulin lispro (HUMALOG) 100 UNIT/ML injection Inject 2-20 Units into the skin 3 (three) times daily with meals. Sliding scale     . Insulin Syringe-Needle U-100 28G X 1/2" 1 ML MISC 1 each by Does not apply route 3 (three) times daily. 100 each 3  . ipratropium-albuterol (DUONEB) 0.5-2.5 (3) MG/3ML SOLN Take 3 mLs by nebulization every 4 (four) hours as needed (shortness of breath).     Elmore Guise Devices (ADJUSTABLE LANCING DEVICE) MISC TO check blood glucose daily    . LANTUS SOLOSTAR 100 UNIT/ML Solostar Pen INJECT 20 UNITS UNDER THE SKIN EVERY MORNING AND INJECT 20 UNITS UNDER THE SKIN EVERY EVENING (Patient taking differently: Inject 20 Units into the skin 2 (two) times daily. ) 45 mL 0  . levalbuterol (XOPENEX) 0.63 MG/3ML nebulizer solution Inhale 3 mLs (0.63 mg total) into the lungs 4 (four) times daily. 3 mL 0  . Lidocaine (SALONPAS PAIN RELIEVING EX) Apply 1 patch topically daily.     Marland Kitchen lidocaine-prilocaine (EMLA) cream Apply a small amount to port a cath site and cover with plastic wrap 1 hour prior to chemotherapy appointments 30 g 3  . metFORMIN (GLUCOPHAGE) 500 MG tablet Take 500 mg by mouth in the morning and at bedtime.    . metoprolol succinate (TOPROL-XL) 100 MG 24 hr tablet Take 100 mg by mouth daily.    . Misc. Devices MISC  Please provide patient with an electric wheelchair. 1 each 0  . Multiple Vitamin (MULTIVITAMIN WITH MINERALS) TABS tablet Take 1 tablet by mouth daily.    . potassium chloride (KLOR-CON) 10 MEQ tablet Take 10 mEq by mouth 2 (two) times daily.    . pregabalin (LYRICA) 150 MG capsule Take 150 mg by mouth in the morning and at bedtime.    Marland Kitchen PROAIR HFA 108 (90 Base) MCG/ACT inhaler INHALE TWO PUFFS BY MOUTH EVERY 4 HOURS AS NEEDED FOR WHEEZING 17 g 0  . spironolactone (ALDACTONE) 25 MG tablet Take 12.5 mg by mouth 2 (two) times  daily.     . tamsulosin (FLOMAX) 0.4 MG CAPS capsule Take 1 capsule (0.4 mg total) by mouth 2 (two) times daily after a meal. 180 capsule 1  . traZODone (DESYREL) 50 MG tablet Take 50 mg by mouth at bedtime.  5  . doxycycline (VIBRAMYCIN) 100 MG capsule Take 100 mg by mouth 2 (two) times daily.     No current facility-administered medications for this visit.   Allergies:  Patient has no known allergies.   ROS: Chronic shortness of breath.  Intermittent cough.  Physical Exam: VS:  BP 124/78   Pulse 73   Ht 6\' 1"  (1.854 m)   Wt 247 lb (112 kg)   SpO2 (!) 85%   BMI 32.59 kg/m , BMI Body mass index is 32.59 kg/m.  Wt Readings from Last 3 Encounters:  12/30/19 247 lb (112 kg)  12/15/19 243 lb 9.7 oz (110.5 kg)  11/24/19 244 lb (110.7 kg)    General: Chronically ill-appearing male in wheelchair wearing oxygen via nasal cannula. HEENT: Conjunctiva and lids normal, no mask. Neck: Supple, no elevated JVP or carotid bruits, no thyromegaly. Lungs: Clear to auscultation, nonlabored breathing at rest. Cardiac: Regular rate and rhythm, no S3 or significant systolic murmur. Extremities: Mild lower leg edema.  Excoriations on the legs.  ECG:  An ECG dated 07/02/2019 was personally reviewed today and demonstrated:  Sinus rhythm with low voltage and decreased R wave progression, QTc 430 ms.  Recent Labwork: 05/15/2019: B Natriuretic Peptide 48.0 07/01/2019: Magnesium  1.5 12/15/2019: ALT 29; AST 17; BUN 26; Creatinine, Ser 1.15; Hemoglobin 13.5; Platelets 120; Potassium 3.7; Sodium 135; TSH 4.684  December 2021: pro-BNP 433, hemoglobin 14.0, platelets 169, creatinine 1.27, BUN 18, potassium 4.2, AST 26, ALT 47, TSH 3.285  Other Studies Reviewed Today:  Echocardiogram 02/02/2019 [facility]): Summary  1. Technically difficult study.  2. The left ventricular systolic function is mildly decreased, LVEF is  visually estimated at 45-50%.   Left Ventricle  The left ventricular systolicfunction is mildly decreased, LVEF is visually  estimated at 45-50%.   Right Ventricle  Right ventricle is not well visualized.   Left Atrium  The left atrium is not well visualized but probably normal in size.   Right Atrium  The right atrium is not well visualized but probably normal in size.   Aortic Valve  The aortic valve is not well visualized.  There is no significant aortic regurgitation.   Pulmonic Valve  Pulmonary valve is not well visualized.   Mitral Valve  The mitral valve is not well visualized.  There is no significant mitral valve regurgitation.   Tricuspid Valve  The tricuspid valve is not well visualized.  There is no significant tricuspid regurgitation.   Pericardium/Pleural  There is no pericardial effusion.  Pericardial fat pad present.   Inferior Vena Cava  IVC size and inspiratory change suggest normal right atrial pressure. (0-5  mmHg).    Chest CTA 11/23/2019 [facility]): 1. Negative for pulmonary embolus.  2. Narrowing of the proximal right mainstem bronchus is accentuated  by expiratory phase imaging.  3. Post treatment volume loss in the right upper lobe.  4. Enlarging subpleural nodule in the lateral right upper lobe,  worrisome for disease recurrence/progression.  5. Previously seen 5 mm posterior left lower lobe nodule is not  readily appreciated.  6. Cirrhosis.  7. Aortic atherosclerosis  (ICD10-I70.0). Coronary artery  calcification.  8. Emphysema (ICD10-J43.9).   Assessment and Plan:  1.  Persistent atrial fibrillation/flutter.  CHA2DS2-VASc score is 5.  He is currently on Eliquis for stroke prophylaxis, recent lab work reviewed.  Continue Tikosyn, diltiazem CD, and Toprol-XL as before.  Heart rate regular today and he reports no sense of palpitations.  2.  Severe oxygen dependent COPD with chronic hypoxic respiratory failure as well as small cell lung cancer.  He continues to follow with both Pulmonary and Oncology.  3.  Nonischemic cardiomyopathy, LVEF 45 to 50% range by echocardiogram earlier in the year.  Medication Adjustments/Labs and Tests Ordered: Current medicines are reviewed at length with the patient today.  Concerns regarding medicines are outlined above.   Tests Ordered: No orders of the defined types were placed in this encounter.   Medication Changes: No orders of the defined types were placed in this encounter.   Disposition:  Follow up 6 months in the [place] office.  Signed, Satira Sark, MD, [facility] 12/30/2019 10:39 AM    [facility] [address], [place], [place] [address] Phone: [phone]; Fax: [phone]

## 2020-01-04 ENCOUNTER — Ambulatory Visit
Admission: RE | Admit: 2020-01-04 | Discharge: 2020-01-04 | Disposition: A | Discharge status: Home / Self Care | Payer: Medicare Other | Source: Ambulatory Visit | Attending: Radiation Oncology | Admitting: Radiation Oncology

## 2020-01-04 ENCOUNTER — Other Ambulatory Visit: Payer: Self-pay

## 2020-01-04 DIAGNOSIS — C3490 Malignant neoplasm of unspecified part of unspecified bronchus or lung: Secondary | ICD-10-CM

## 2020-01-04 NOTE — Progress Notes (Signed)
[place]   [phone]) [phone] [phone]   Clinic Follow up Note   Patient Care Team: [facility] as PCP - Arlana Lindau, Aloha Gell, MD as PCP - Cardiology (Cardiology) Gala Romney, Cristopher Estimable, MD as Consulting Physician (Gastroenterology) Derek Jack, MD as Medical Oncologist (Oncology) Donetta Potts, RN as Oncology Nurse Navigator (Oncology)  Date of Service:  01/05/2020  CHIEF COMPLAINT:  Follow-up for right small cell lung cancer  SUMMARY OF ONCOLOGIC HISTORY: Oncology History  Small cell lung cancer ([place])  04/03/2016 Initial Diagnosis   Small cell lung cancer ([place])   04/09/2016 Imaging   MRI brain- Negative for metastatic disease to the brain. No acute abnormality.   04/09/2016 Procedure   Right IJ port catheter placement by IR   04/13/2016 PET scan   . The right apical lung mass has significantly enlarged and there is new right paratracheal adenopathy as well as a hypermetabolic new lymph node in the right inner infraclavicular fossa potentially with early impingement on adjacent neurovascular structures. 2. Other imaging findings of potential clinical significance: Chronic bilateral maxillary sinusitis. Left carotid atherosclerotic calcification. Aortoiliac atherosclerotic vascular disease. Coronary atherosclerosis. Paraseptal emphysema. Mild atelectasis in the lung bases. Scattered sigmoid colon diverticula. Probable right pars defect at L5.   05/09/2016 - 07/12/2016 Radiation Therapy   Radiotherapy to right Lung+MS 63 Gy/35 fractions at 1.8 GY/f using 6 x photons 3- D CRT technique Dates: 4/18-6/21/18       06/19/2016 Treatment Plan Change   Cisplatin dose reduced by 20% for cycle #4 due to severe thrombocytopenia following cycle #3 (13,000).   08/21/2016 PET scan   1. Marked reduction in size and metabolic activity of RIGHT upper lobe mass. 2. Resolution of mediastinal and RIGHT  supraclavicular lymphadenopathy. 3. No evidence of disease progression.   05/06/2019 -  Chemotherapy   The patient had palonosetron (ALOXI) injection 0.25 mg, 0.25 mg, Intravenous,  Once, 6 of 6 cycles Administration: 0.25 mg (05/06/2019), 0.25 mg (05/27/2019), 0.25 mg (06/17/2019), 0.25 mg (07/15/2019), 0.25 mg (08/05/2019), 0.25 mg (08/26/2019) pegfilgrastim-cbqv (UDENYCA) injection 6 mg, 6 mg, Subcutaneous, Once, 6 of 6 cycles Administration: 6 mg (05/11/2019), 6 mg (06/01/2019), 6 mg (06/23/2019), 6 mg (07/20/2019), 6 mg (08/10/2019), 6 mg (08/31/2019) CARBOplatin (PARAPLATIN) 450 mg in sodium chloride 0.9 % 250 mL chemo infusion, 450 mg (100 % of original dose 446.4 mg), Intravenous,  Once, 6 of 6 cycles Dose modification:   (original dose 446.4 mg, Cycle 1, Reason: Provider Judgment),   (original dose 500 mg, Cycle 2, Reason: Provider Judgment), 500 mg (original dose 500 mg, Cycle 2, Reason: Provider Judgment), 500 mg (original dose 500 mg, Cycle 3),   (original dose 527.6 mg, Cycle 4, Reason: Provider Judgment) Administration: 450 mg (05/06/2019), 500 mg (05/27/2019), 500 mg (06/17/2019), 540 mg (07/15/2019), 510 mg (08/05/2019), 540 mg (08/26/2019) etoposide (VEPESID) 190 mg in sodium chloride 0.9 % 500 mL chemo infusion, 80 mg/m2 = 190 mg (80 % of original dose 100 mg/m2), Intravenous,  Once, 6 of 6 cycles Dose modification: 80 mg/m2 (80 % of original dose 100 mg/m2, Cycle 1, Reason: Provider Judgment), 80 mg/m2 (original dose 100 mg/m2, Cycle 1, Reason: Provider Judgment), 80 mg/m2 (80 % of original dose 100 mg/m2, Cycle 2, Reason: Provider Judgment) Administration: 190 mg (05/06/2019), 190 mg (05/07/2019), 190 mg (05/08/2019), 190 mg (05/27/2019), 190 mg (05/28/2019), 190 mg (05/29/2019), 190 mg (06/17/2019), 190 mg (06/18/2019), 190 mg (06/19/2019), 190 mg (07/15/2019), 190 mg (07/16/2019), 190 mg (07/17/2019), 190  mg (08/05/2019), 190 mg (08/06/2019), 190 mg (08/07/2019), 190 mg (08/26/2019), 190 mg (08/27/2019), 190 mg  (08/28/2019) fosaprepitant (EMEND) 150 mg in sodium chloride 0.9 % 145 mL IVPB, 150 mg, Intravenous,  Once, 6 of 6 cycles Administration: 150 mg (05/06/2019), 150 mg (05/27/2019), 150 mg (06/17/2019), 150 mg (07/15/2019), 150 mg (08/05/2019), 150 mg (08/26/2019) atezolizumab (TECENTRIQ) 1,200 mg in sodium chloride 0.9 % 250 mL chemo infusion, 1,200 mg, Intravenous, Once, 10 of 12 cycles Administration: 1,200 mg (05/06/2019), 1,200 mg (05/27/2019), 1,200 mg (06/17/2019), 1,200 mg (07/15/2019), 1,200 mg (09/16/2019), 1,200 mg (10/13/2019), 1,200 mg (11/19/2019), 1,200 mg (08/05/2019), 1,200 mg (08/26/2019), 1,200 mg (12/15/2019)  for chemotherapy treatment.       PRIOR THERAPY: Carboplatin, VP-16 and atezolizumab x 3 cycles from 05/06/2019 to 08/28/2019  NGS Results: Not done  CURRENT THERAPY:  Atezolizumab every 3 weeks from 09/16/19  He started [facility] with Dr Lisbeth Renshaw on 12/04/19, but was hospitalized for COPD and did not complete  INTERVAL HISTORY:  [name] is here for a follow up. He is under the care of Dr Delton Coombes and is seeing me today in interim.  He was recently hospitalized at [facility] for CHF exacerbation, was released yesterday.  He has appointment with his cardiologist this Thursday.  His shortness of breath is stable, his nasal oxygen 2 L continuously.  He denies any significant pain, abdominal discomfort or change of bowel habits.  He does have bilateral lower extremity edema, which is also stable.  He is urinating well, no dysuria, no fever or chills.  His activity level is limited, but able to function at home.  He has been checking his sugar sporadically at home, has been high overall.  He does Lantus and insulin sliding scale before meals.  He forgot Lantus injection this morning.    All other systems were reviewed with the patient and are negative.  MEDICAL HISTORY:  Past Medical History:  Diagnosis Date  . Alcohol abuse    Quit 08/23/11  . Arthritis   . Atrial  fibrillation ([place])   . Atrial flutter ([place])    CTI ablation by Dr [name] 10/2011  . Bacteremia due to Gram-positive bacteria 11/29/2016  . CHF (congestive heart failure) ([place])   . COPD (chronic obstructive pulmonary disease) ([place])   . Depression   . Essential hypertension   . History of cardiomyopathy    LVEF 25-30% 08/2011 with subsequent normalization  . History of kidney stones   . Obesity   . Prostate enlargement   . Small cell lung cancer ([place])    Right upper lobe - follows with [facility]  . Type 2 diabetes mellitus ([place])     SURGICAL HISTORY: Past Surgical History:  Procedure Laterality Date  . APPENDECTOMY    . ATRIAL ABLATION SURGERY  11/06/2011   CTI ablation for atrial flutter by Dr [name]  . ATRIAL FLUTTER ABLATION N/A 11/06/2011   Procedure: ATRIAL FLUTTER ABLATION;  Surgeon: Thompson Grayer, MD;  Location: [facility] CATH LAB;  Service: Cardiovascular;  Laterality: N/A;  . Cataracts    . COLONOSCOPY WITH PROPOFOL N/A 06/06/2017   Procedure: COLONOSCOPY WITH PROPOFOL;  Surgeon: [name], MD;  Location: [facility] ENDO SUITE;  Service: Endoscopy;  Laterality: N/A;  1:15pm  . IR GENERIC HISTORICAL  04/09/2016   IR US GUIDE VASC ACCESS RIGHT 04/09/2016 [name], DO [facility]-INTERV RAD  . IR GENERIC HISTORICAL  04/09/2016   IR FLUORO GUIDE PORT INSERTION RIGHT 04/09/2016 [name], DO [facility]-INTERV  RAD  . PORT-A-CATH REMOVAL N/A 04/24/2017   Procedure: MINOR REMOVAL PORT-A-CATH;  Surgeon: [name], MD;  Location: [facility] ORS;  Service: General;  Laterality: N/A;  . PORTACATH PLACEMENT Left 04/22/2019   Procedure: INSERTION PORT-A-CATH;  Surgeon: Aviva Signs, MD;  Location: [facility] ORS;  Service: General;  Laterality: Left;  . TONSILLECTOMY      I have reviewed the social history and family history with the patient and they are unchanged from previous note.  ALLERGIES:  has No Known Allergies.  MEDICATIONS:  Current Outpatient Medications  Medication Sig Dispense Refill  .  acetaminophen (TYLENOL) 500 MG tablet Take 1,000 mg by mouth every 6 (six) hours as needed for moderate pain or headache.     Marland Kitchen apixaban (ELIQUIS) 5 MG TABS tablet Take 5 mg by mouth 2 (two) times daily.     Marland Kitchen AQUALANCE LANCETS 30G MISC USE TO check blood glucose twice daily  99  . Artificial Saliva (BIOTENE DRY MOUTH MOISTURIZING) SOLN Apply 1 Dose topically as needed (dry mouth).     Marland Kitchen atorvastatin (LIPITOR) 20 MG tablet Take 1 tablet (20 mg total) by mouth daily. 90 tablet 3  . benzonatate (TESSALON) 100 MG capsule Take 2 capsules (200 mg total) by mouth every 8 (eight) hours as needed. 30 capsule 1  . Carboxymethylcellul-Glycerin (LUBRICATING EYE DROPS OP) Place 1 drop into the left eye daily as needed (dry eyes).     Marland Kitchen Co-Enzyme Q10 200 MG CAPS See admin instructions.    Marland Kitchen diltiazem (DILACOR XR) 240 MG 24 hr capsule Take 240 mg by mouth daily.    Marland Kitchen dofetilide (TIKOSYN) 250 MCG capsule Take 1 capsule (250 mcg total) by mouth 2 (two) times daily.    . fluticasone (FLONASE) 50 MCG/ACT nasal spray Place 2 sprays into both nostrils daily. 16 g 6  . Fluticasone-Umeclidin-Vilant (TRELEGY ELLIPTA) 100-62.5-25 MCG/INH AEPB Inhale 1 puff into the lungs daily. 60 each 6  . furosemide (LASIX) 40 MG tablet TAKE 1 TABLET BY MOUTH EVERY DAY 90 tablet 0  . gabapentin (NEURONTIN) 300 MG capsule Take 600 mg by mouth 2 (two) times daily.     Marland Kitchen GLOBAL EASE INJECT PEN NEEDLES 31G X 8 MM MISC Inject into the skin 3 (three) times daily.    Marland Kitchen guaifenesin (ROBITUSSIN) 100 MG/5ML syrup Take 200 mg by mouth at bedtime as needed for cough.     Marland Kitchen HM SENNA 8.6 MG tablet Take 2 tablets by mouth at bedtime.    Marland Kitchen HYDROcodone-acetaminophen (NORCO/VICODIN) 5-325 MG tablet Take 1 tablet by mouth at bedtime as needed. 30 tablet 0  . insulin lispro (HUMALOG) 100 UNIT/ML injection Inject 2-20 Units into the skin 3 (three) times daily with meals. Sliding scale    . Insulin Syringe-Needle U-100 28G X 1/2" 1 ML MISC 1 each by Does  not apply route 3 (three) times daily. 100 each 3  . ipratropium-albuterol (DUONEB) 0.5-2.5 (3) MG/3ML SOLN Take 3 mLs by nebulization every 4 (four) hours as needed (shortness of breath).     Elmore Guise Devices (ADJUSTABLE LANCING DEVICE) MISC TO check blood glucose daily    . LANTUS SOLOSTAR 100 UNIT/ML Solostar Pen INJECT 20 UNITS UNDER THE SKIN EVERY MORNING AND INJECT 20 UNITS UNDER THE SKIN EVERY EVENING (Patient taking differently: Inject 20 Units into the skin 2 (two) times daily.) 45 mL 0  . levalbuterol (XOPENEX) 0.63 MG/3ML nebulizer solution Inhale 3 mLs (0.63 mg total) into the lungs 4 (four) times daily.  3 mL 0  . Lidocaine (SALONPAS PAIN RELIEVING EX) Apply 1 patch topically daily.     . metFORMIN (GLUCOPHAGE) 500 MG tablet Take 500 mg by mouth in the morning and at bedtime.    . metoprolol succinate (TOPROL-XL) 100 MG 24 hr tablet Take 100 mg by mouth daily.    . Misc. Devices MISC Please provide patient with an electric wheelchair. 1 each 0  . Multiple Vitamin (MULTIVITAMIN WITH MINERALS) TABS tablet Take 1 tablet by mouth daily.    . potassium chloride (KLOR-CON) 10 MEQ tablet Take 10 mEq by mouth 2 (two) times daily.    . pregabalin (LYRICA) 150 MG capsule Take 150 mg by mouth in the morning and at bedtime.    Marland Kitchen PROAIR HFA 108 (90 Base) MCG/ACT inhaler INHALE TWO PUFFS BY MOUTH EVERY 4 HOURS AS NEEDED FOR WHEEZING 17 g 0  . spironolactone (ALDACTONE) 25 MG tablet Take 12.5 mg by mouth 2 (two) times daily.     . tamsulosin (FLOMAX) 0.4 MG CAPS capsule Take 1 capsule (0.4 mg total) by mouth 2 (two) times daily after a meal. 180 capsule 1  . traZODone (DESYREL) 50 MG tablet Take 50 mg by mouth at bedtime.  5  . lidocaine-prilocaine (EMLA) cream Apply a small amount to port a cath site and cover with plastic wrap 1 hour prior to chemotherapy appointments (Patient not taking: Reported on 01/05/2020) 30 g 3   No current facility-administered medications for this visit.    Facility-Administered Medications Ordered in Other Visits  Medication Dose Route Frequency Provider Last Rate Last Admin  . insulin aspart (novoLOG) injection 10 Units  10 Units Subcutaneous Once Truitt Merle, MD        PHYSICAL EXAMINATION: ECOG PERFORMANCE STATUS: 3 - Symptomatic, >50% confined to bed GENERAL:alert, no distress and comfortable, on [place] oxygen  SKIN: skin color, texture, turgor are normal, no rashes or significant lesions EYES: normal, Conjunctiva are pink and non-injected, sclera clear NECK: supple, thyroid normal size, non-tender, without nodularity LYMPH:  no palpable lymphadenopathy in the cervical, axillary  LUNGS: clear to auscultation and percussion with normal breathing effort, decreased breath sounds on both side, no rales or wheezing HEART: regular rate & rhythm and no murmurs and no lower extremity edema ABDOMEN:abdomen soft, non-tender and normal bowel sounds Musculoskeletal:no cyanosis of digits and no clubbing, symmetrical bilateral pitting edema up to knee NEURO: alert & oriented x 3 with fluent speech, no focal motor/sensory deficits  LABORATORY DATA:  I have reviewed the data as listed CBC Latest Ref Rng & Units 01/05/2020 12/15/2019 11/19/2019  WBC 4.0 - 10.5 K/uL 6.7 7.1 6.4  Hemoglobin 13.0 - 17.0 g/dL 12.3(L) 13.5 12.6(L)  Hematocrit 39.0 - 52.0 % 38.6(L) 43.9 40.8  Platelets 150 - 400 K/uL 141(L) 120(L) 142(L)     CMP Latest Ref Rng & Units 01/05/2020 12/15/2019 12/04/2019  Glucose 70 - 99 mg/dL 382(H) 342(H) -  BUN 8 - 23 mg/dL 24(H) 26(H) 18  Creatinine 0.61 - 1.24 mg/dL 1.21 1.15 1.31(H)  Sodium 135 - 145 mmol/L 136 135 -  Potassium 3.5 - 5.1 mmol/L 3.5 3.7 -  Chloride 98 - 111 mmol/L 99 94(L) -  CO2 22 - 32 mmol/L 29 30 -  Calcium 8.9 - 10.3 mg/dL 8.7(L) 8.8(L) -  Total Protein 6.5 - 8.1 g/dL 5.9(L) 6.3(L) -  Total Bilirubin 0.3 - 1.2 mg/dL 0.4 0.6 -  Alkaline Phos 38 - 126 U/L 49 54 -  AST 15 - 41  U/L 20 17 -  ALT 0 - 44 U/L 30 29 -       RADIOGRAPHIC STUDIES: I have personally reviewed the radiological images as listed and agreed with the findings in the report. No results found.   ASSESSMENT & PLAN:  Chase Herrera is a 69 y.o. male with    1. Recurrent small cell lung cancer: -Diagnosed in 2018. Treated for limited stage small cell lung cancer with chemoradiation with cisplatin and etoposide therapy on 06/21/2016 followed by PCI. -Had lung cancer recurrence in 03/2019. CT scan on 04/17/2019 showed worsening right lung nodules. Left lower lobe infectious cavitary lesion was decreased in size. New right middle lobe lung nodule present. MRI brain from 05/13/19 showed brain metastasis, status post SBRT treatment on 12/04/2019 -S/p 6 cycles of carboplatin, VP-16 and atezolizumab from 05/06/2019 through 08/26/2019.  -Currently on atezolizumab q3weeks alone since 09/16/19.  He is tolerating treatment very well, no noticeable side effects. -Lab reviewed, adequate for treatment, will proceed Tecentriq today and continue every 3 weeks -We will repeat restaging CT chest, abdomen pelvis with contrast before next visit in 3 weeks   2. Brain metastasis -MRI of the brain on 05/13/2019 showed small 7 mm ring enhancing lesion at the junction of the left posterior temporal and anterior occipital lobes compatible with solitary metastasis. Typical appearance of lacunar infarct is also considered. -On systemic treatment. Stable on 07/31/19 MRI and mild increase on 10/28/19 MRI.  -He received SRS with Dr Lisbeth Renshaw on 12/04/19 for both lesions -He had a virtual visit with rad/onc yesterday, plan to repeat her brain MRI and follow-up in 2 months   3. AF and CHF  -Continue alloplast, furosemide and other cardiac meds -He has follow-up with his cardiologist later this week   4. . Diabetes: -Continue Lantus twice daily and NovoLog sliding scale. -BG not well controlled  PLAN:  -Lab reviewed, adequate for treatment, will proceed Tecentriq  today and continue every 3 weeks -He will follow-up with his cardiologist later this week -Lab, follow-up and Tecentriq in 3 weeks with restaging CT chest, abdomen pelvis before visit  -no med refill needed today    No problem-specific Assessment & Plan notes found for this encounter.   Orders Placed This Encounter  Procedures  . CT CHEST ABDOMEN PELVIS W CONTRAST    Standing Status:   Future    Standing Expiration Date:   01/04/2021    Order Specific Question:   If indicated for the ordered procedure, I authorize the administration of contrast media per Radiology protocol    Answer:   Yes    Order Specific Question:   Preferred imaging location?    Answer:   [facility]    Order Specific Question:   Release to patient    Answer:   Immediate    Order Specific Question:   Is Oral Contrast requested for this exam?    Answer:   Yes, Per Radiology protocol    Order Specific Question:   Reason for Exam (SYMPTOM  OR DIAGNOSIS REQUIRED)    Answer:   assess treatment response   All questions were answered. The patient knows to call the clinic with any problems, questions or concerns. No barriers to learning was detected. The total time spent in the appointment was 30 minutes.     Truitt Merle, MD 01/05/2020   I, Joslyn Devon, am acting as scribe for Truitt Merle, MD.   I have reviewed the above documentation for accuracy and  completeness, and I agree with the above.

## 2020-01-04 NOTE — Progress Notes (Signed)
  Radiation Oncology         [phone] ________________________________  Name: Chase Herrera MRN: [id number]  Date of Service: 01/04/2020  DOB: [birth year]  Post Treatment Telephone Note  Diagnosis:   Progressive Metastatic Limited Stage Small Cell Carcinoma of the right lung with brain disease.  Interval Since Last Radiation:  5 weeks   12/04/19 SRS Treatment: Both lesions were treated to 20 Gy  PTV1 LtOccTemp PTV2 RtCaudate  September-October 2018 The patient received PCI to the whole brain with 25 Gy in 10 fraction at [facility] with Dr. Leotis Shames  05/09/16-07/12/16:  The patient received 63 Gy in 35 fractions to the right lung at [facility] with Dr. Leotis Shames  Narrative:  The patient was contacted today for routine follow-up. During treatment he did very well with radiotherapy and did not have significant desquamation. He reports he has done well since radiation without headaches, visual, speech, or other movement changes. Unfortunately he's currently hospitalized in [place] for COPD exacerbation and what sounds like pleural effusion.  Impression/Plan: 1. Progressive Metastatic Limited Stage Small Cell Carcinoma of the right lung with brain disease. The patient has been doing well since completion of radiotherapy. We discussed that we would recommend repeat MRI in about 2 months and follow every 3 months for the first year since treatment then increase intervals in the years following. He will also continue to follow up with Dr. Delton Coombes in medical oncology.   2. COPD and pleural effusion. It's hard to say what all is going on since the hospitalization records are minimal in care everywhere, but we will follow his course expectantly.     Carola Rhine, PAC

## 2020-01-05 ENCOUNTER — Inpatient Hospital Stay (HOSPITAL_COMMUNITY): Payer: Medicare Other

## 2020-01-05 ENCOUNTER — Inpatient Hospital Stay (HOSPITAL_COMMUNITY): Payer: Medicare Other | Attending: Hematology

## 2020-01-05 ENCOUNTER — Telehealth: Payer: Self-pay | Admitting: Cardiology

## 2020-01-05 ENCOUNTER — Other Ambulatory Visit: Payer: Self-pay

## 2020-01-05 ENCOUNTER — Inpatient Hospital Stay (HOSPITAL_BASED_OUTPATIENT_CLINIC_OR_DEPARTMENT_OTHER): Payer: Medicare Other | Admitting: Hematology

## 2020-01-05 ENCOUNTER — Encounter (HOSPITAL_COMMUNITY): Payer: Self-pay | Admitting: Hematology

## 2020-01-05 VITALS — BP 121/75 | HR 105 | Temp 97.4°F | Resp 18

## 2020-01-05 DIAGNOSIS — C3491 Malignant neoplasm of unspecified part of right bronchus or lung: Secondary | ICD-10-CM | POA: Diagnosis not present

## 2020-01-05 DIAGNOSIS — Z87442 Personal history of urinary calculi: Secondary | ICD-10-CM | POA: Diagnosis not present

## 2020-01-05 DIAGNOSIS — R739 Hyperglycemia, unspecified: Secondary | ICD-10-CM

## 2020-01-05 DIAGNOSIS — Z79899 Other long term (current) drug therapy: Secondary | ICD-10-CM | POA: Diagnosis not present

## 2020-01-05 DIAGNOSIS — C7931 Secondary malignant neoplasm of brain: Secondary | ICD-10-CM | POA: Insufficient documentation

## 2020-01-05 DIAGNOSIS — I429 Cardiomyopathy, unspecified: Secondary | ICD-10-CM | POA: Diagnosis not present

## 2020-01-05 DIAGNOSIS — M1990 Unspecified osteoarthritis, unspecified site: Secondary | ICD-10-CM | POA: Insufficient documentation

## 2020-01-05 DIAGNOSIS — D696 Thrombocytopenia, unspecified: Secondary | ICD-10-CM | POA: Insufficient documentation

## 2020-01-05 DIAGNOSIS — E118 Type 2 diabetes mellitus with unspecified complications: Secondary | ICD-10-CM | POA: Diagnosis not present

## 2020-01-05 DIAGNOSIS — Z9049 Acquired absence of other specified parts of digestive tract: Secondary | ICD-10-CM | POA: Diagnosis not present

## 2020-01-05 DIAGNOSIS — Z7901 Long term (current) use of anticoagulants: Secondary | ICD-10-CM | POA: Insufficient documentation

## 2020-01-05 DIAGNOSIS — I2510 Atherosclerotic heart disease of native coronary artery without angina pectoris: Secondary | ICD-10-CM | POA: Insufficient documentation

## 2020-01-05 DIAGNOSIS — C3411 Malignant neoplasm of upper lobe, right bronchus or lung: Secondary | ICD-10-CM | POA: Diagnosis present

## 2020-01-05 DIAGNOSIS — Z794 Long term (current) use of insulin: Secondary | ICD-10-CM | POA: Diagnosis not present

## 2020-01-05 DIAGNOSIS — K5730 Diverticulosis of large intestine without perforation or abscess without bleeding: Secondary | ICD-10-CM | POA: Insufficient documentation

## 2020-01-05 DIAGNOSIS — Z5112 Encounter for antineoplastic immunotherapy: Secondary | ICD-10-CM | POA: Diagnosis not present

## 2020-01-05 DIAGNOSIS — J449 Chronic obstructive pulmonary disease, unspecified: Secondary | ICD-10-CM | POA: Insufficient documentation

## 2020-01-05 DIAGNOSIS — I110 Hypertensive heart disease with heart failure: Secondary | ICD-10-CM | POA: Diagnosis not present

## 2020-01-05 DIAGNOSIS — I4891 Unspecified atrial fibrillation: Secondary | ICD-10-CM | POA: Insufficient documentation

## 2020-01-05 DIAGNOSIS — I509 Heart failure, unspecified: Secondary | ICD-10-CM | POA: Diagnosis not present

## 2020-01-05 LAB — CBC WITH DIFFERENTIAL/PLATELET
Abs Immature Granulocytes: 0.05 10*3/uL (ref 0.00–0.07)
Basophils Absolute: 0 10*3/uL (ref 0.0–0.1)
Basophils Relative: 0 %
Eosinophils Absolute: 0 10*3/uL (ref 0.0–0.5)
Eosinophils Relative: 0 %
HCT: 38.6 % — ABNORMAL LOW (ref 39.0–52.0)
Hemoglobin: 12.3 g/dL — ABNORMAL LOW (ref 13.0–17.0)
Immature Granulocytes: 1 %
Lymphocytes Relative: 9 %
Lymphs Abs: 0.6 10*3/uL — ABNORMAL LOW (ref 0.7–4.0)
MCH: 31.6 pg (ref 26.0–34.0)
MCHC: 31.9 g/dL (ref 30.0–36.0)
MCV: 99.2 fL (ref 80.0–100.0)
Monocytes Absolute: 0.7 10*3/uL (ref 0.1–1.0)
Monocytes Relative: 10 %
Neutro Abs: 5.3 10*3/uL (ref 1.7–7.7)
Neutrophils Relative %: 80 %
Platelets: 141 10*3/uL — ABNORMAL LOW (ref 150–400)
RBC: 3.89 MIL/uL — ABNORMAL LOW (ref 4.22–5.81)
RDW: 14.4 % (ref 11.5–15.5)
WBC: 6.7 10*3/uL (ref 4.0–10.5)
nRBC: 0 % (ref 0.0–0.2)

## 2020-01-05 LAB — COMPREHENSIVE METABOLIC PANEL
ALT: 30 U/L (ref 0–44)
AST: 20 U/L (ref 15–41)
Albumin: 3.4 g/dL — ABNORMAL LOW (ref 3.5–5.0)
Alkaline Phosphatase: 49 U/L (ref 38–126)
Anion gap: 8 (ref 5–15)
BUN: 24 mg/dL — ABNORMAL HIGH (ref 8–23)
CO2: 29 mmol/L (ref 22–32)
Calcium: 8.7 mg/dL — ABNORMAL LOW (ref 8.9–10.3)
Chloride: 99 mmol/L (ref 98–111)
Creatinine, Ser: 1.21 mg/dL (ref 0.61–1.24)
GFR, Estimated: >60 mL/min (ref >60)
Glucose, Bld: 382 mg/dL — ABNORMAL HIGH (ref 70–99)
Potassium: 3.5 mmol/L (ref 3.5–5.1)
Sodium: 136 mmol/L (ref 135–145)
Total Bilirubin: 0.4 mg/dL (ref 0.3–1.2)
Total Protein: 5.9 g/dL — ABNORMAL LOW (ref 6.5–8.1)

## 2020-01-05 MED ORDER — INSULIN ASPART 100 UNIT/ML ~~LOC~~ SOLN
10.0000 [IU] | Freq: Once | SUBCUTANEOUS | Status: AC
  Administered 2020-01-05: 10 [IU] via SUBCUTANEOUS
  Filled 2020-01-05: qty 0.1

## 2020-01-05 MED ORDER — SODIUM CHLORIDE 0.9% FLUSH
10.0000 mL | INTRAVENOUS | Status: DC | PRN
  Administered 2020-01-05: 10 mL

## 2020-01-05 MED ORDER — SODIUM CHLORIDE 0.9 % IV SOLN: Freq: Once | INTRAVENOUS | Status: AC

## 2020-01-05 MED ORDER — SODIUM CHLORIDE 0.9 % IV SOLN
1200.0000 mg | Freq: Once | INTRAVENOUS | Status: AC
  Administered 2020-01-05: 1200 mg via INTRAVENOUS
  Filled 2020-01-05: qty 20

## 2020-01-05 MED ORDER — HEPARIN SOD (PORK) LOCK FLUSH 100 UNIT/ML IV SOLN
500.0000 [IU] | Freq: Once | INTRAVENOUS | Status: AC | PRN
  Administered 2020-01-05: 500 [IU]

## 2020-01-05 NOTE — Progress Notes (Signed)
Cardiology Office Note  Date: 01/07/2020   ID: Chase, Herrera 1950-11-10, MRN [id number]  PCP:  Chase Herrera, [place]  Cardiologist:  Chase Lesches, MD Electrophysiologist:  None   Chief Complaint: Hospital follow up CHF  History of Present Illness: Chase Herrera is a 69 y.o. male with a history of atrial fibrillation, COPD, Lung CA, DM2, CHF, HTN.  Last encounter with Dr Chase Herrera 12/30/2019: Reported no palpitations and was tolerating his medications well. He was following with pulmonary for underlying COPD and Ling Ca. He was continuing on continuous nasal 02 and in a wheelchair. He did not report ant bleeding on eliquis. Last echocardiogram in January demonstrated EF 45-50%. CHA2DS2-VASC score 5. He was continuing Tikosyn, diltiazem CD, and Toprol XL. Rate was controlled and regular with no complaints of palpitations.   He presented to [facility] ED 01/03/2020 with complaint of having trouble breathing and was admitted for exacerbation of chronic CHF and COPD.  He stated he had not been taking his fluid pill.  BNP was 1655.  He was diuresed with IV Lasix and discharged on Lasix 20 mg p.o. twice daily.   He is here today for hospital follow-up status post recent hospital stay at [facility] overnight in the emergency department per his statement.  He states he was never admitted.  He received IV diuresis.  It appears his current dose of Lasix is 80 mg daily.  He continues to have some lower extremity edema.  He is on continuous O2 for severe COPD.  He has recurrence of his lung cancer and being treated by Dr. Delton Herrera with immunotherapy.  He states his metoprolol was stopped during recent hospital stay.  During his recent ED stay the echocardiogram noted he was in atrial flutter with variable block with a rate of 114.  Heart rate today is 86.   Past Medical History:  Diagnosis Date  . Alcohol abuse    Quit 08/23/11  . Arthritis   . Atrial fibrillation  ([place])   . Atrial flutter ([place])    CTI ablation by Dr [name] 10/2011  . Bacteremia due to Gram-positive bacteria 11/29/2016  . CHF (congestive heart failure) ([place])   . COPD (chronic obstructive pulmonary disease) ([place])   . Depression   . Essential hypertension   . History of cardiomyopathy    LVEF 25-30% 08/2011 with subsequent normalization  . History of kidney stones   . Obesity   . Prostate enlargement   . Small cell lung cancer ([place])    Right upper lobe - follows with [facility]  . Type 2 diabetes mellitus ([place])     Past Surgical History:  Procedure Laterality Date  . APPENDECTOMY    . ATRIAL ABLATION SURGERY  11/06/2011   CTI ablation for atrial flutter by Dr [name]  . ATRIAL FLUTTER ABLATION N/A 11/06/2011   Procedure: ATRIAL FLUTTER ABLATION;  Surgeon: Thompson Grayer, MD;  Location: [facility] CATH LAB;  Service: Cardiovascular;  Laterality: N/A;  . Cataracts    . COLONOSCOPY WITH PROPOFOL N/A 06/06/2017   Procedure: COLONOSCOPY WITH PROPOFOL;  Surgeon: [name], MD;  Location: [facility] ENDO SUITE;  Service: Endoscopy;  Laterality: N/A;  1:15pm  . IR GENERIC HISTORICAL  04/09/2016   IR US GUIDE VASC ACCESS RIGHT 04/09/2016 [name], DO [facility]-INTERV RAD  . IR GENERIC HISTORICAL  04/09/2016   IR FLUORO GUIDE PORT INSERTION RIGHT 04/09/2016 [name], DO [facility]-INTERV RAD  . PORT-A-CATH REMOVAL N/A 04/24/2017  Procedure: MINOR REMOVAL PORT-A-CATH;  Surgeon: [name], MD;  Location: [facility] ORS;  Service: General;  Laterality: N/A;  . PORTACATH PLACEMENT Left 04/22/2019   Procedure: INSERTION PORT-A-CATH;  Surgeon: Aviva Signs, MD;  Location: [facility] ORS;  Service: General;  Laterality: Left;  . TONSILLECTOMY      Current Outpatient Medications  Medication Sig Dispense Refill  . acetaminophen (TYLENOL) 500 MG tablet Take 1,000 mg by mouth every 6 (six) hours as needed for moderate pain or headache.     Marland Kitchen apixaban (ELIQUIS) 5 MG TABS tablet Take 5 mg by mouth 2 (two) times daily.      Marland Kitchen AQUALANCE LANCETS 30G MISC USE TO check blood glucose twice daily  99  . Artificial Saliva (BIOTENE DRY MOUTH MOISTURIZING) SOLN Apply 1 Dose topically as needed (dry mouth).     Marland Kitchen atorvastatin (LIPITOR) 20 MG tablet Take 1 tablet (20 mg total) by mouth daily. 90 tablet 3  . benzonatate (TESSALON) 100 MG capsule Take 2 capsules (200 mg total) by mouth every 8 (eight) hours as needed. 30 capsule 1  . Carboxymethylcellul-Glycerin (LUBRICATING EYE DROPS OP) Place 1 drop into the left eye daily as needed (dry eyes).     Marland Kitchen Co-Enzyme Q10 200 MG CAPS See admin instructions.    Marland Kitchen diltiazem (DILACOR XR) 240 MG 24 hr capsule Take 240 mg by mouth daily.    Marland Kitchen dofetilide (TIKOSYN) 250 MCG capsule Take 1 capsule (250 mcg total) by mouth 2 (two) times daily.    . fluticasone (FLONASE) 50 MCG/ACT nasal spray Place 2 sprays into both nostrils daily. 16 g 6  . Fluticasone-Umeclidin-Vilant (TRELEGY ELLIPTA) 100-62.5-25 MCG/INH AEPB Inhale 1 puff into the lungs daily. 60 each 6  . furosemide (LASIX) 80 MG tablet Take 80 mg by mouth daily.    Marland Kitchen gabapentin (NEURONTIN) 300 MG capsule Take 600 mg by mouth 2 (two) times daily.     Marland Kitchen GLOBAL EASE INJECT PEN NEEDLES 31G X 8 MM MISC Inject into the skin 3 (three) times daily.    Marland Kitchen guaifenesin (ROBITUSSIN) 100 MG/5ML syrup Take 200 mg by mouth at bedtime as needed for cough.     Marland Kitchen HM SENNA 8.6 MG tablet Take 2 tablets by mouth at bedtime.    Marland Kitchen HYDROcodone-acetaminophen (NORCO/VICODIN) 5-325 MG tablet Take 1 tablet by mouth at bedtime as needed. 30 tablet 0  . insulin lispro (HUMALOG) 100 UNIT/ML injection Inject 2-20 Units into the skin 3 (three) times daily with meals. Sliding scale    . Insulin Syringe-Needle U-100 28G X 1/2" 1 ML MISC 1 each by Does not apply route 3 (three) times daily. 100 each 3  . ipratropium-albuterol (DUONEB) 0.5-2.5 (3) MG/3ML SOLN Take 3 mLs by nebulization every 4 (four) hours as needed (shortness of breath).     Elmore Guise Devices (ADJUSTABLE  LANCING DEVICE) MISC TO check blood glucose daily    . LANTUS SOLOSTAR 100 UNIT/ML Solostar Pen INJECT 20 UNITS UNDER THE SKIN EVERY MORNING AND INJECT 20 UNITS UNDER THE SKIN EVERY EVENING (Patient taking differently: Inject 20 Units into the skin 2 (two) times daily.) 45 mL 0  . levalbuterol (XOPENEX) 0.63 MG/3ML nebulizer solution Inhale 3 mLs (0.63 mg total) into the lungs 4 (four) times daily. 3 mL 0  . Lidocaine (SALONPAS PAIN RELIEVING EX) Apply 1 patch topically daily.     Marland Kitchen lidocaine-prilocaine (EMLA) cream Apply a small amount to port a cath site and cover with plastic wrap 1 hour prior  to chemotherapy appointments 30 g 3  . metFORMIN (GLUCOPHAGE) 500 MG tablet Take 500 mg by mouth in the morning and at bedtime.    . Misc. Devices MISC Please provide patient with an electric wheelchair. 1 each 0  . Multiple Vitamin (MULTIVITAMIN WITH MINERALS) TABS tablet Take 1 tablet by mouth daily.    . potassium chloride (KLOR-CON) 10 MEQ tablet Take 10 mEq by mouth 2 (two) times daily.    . pregabalin (LYRICA) 150 MG capsule Take 150 mg by mouth in the morning and at bedtime.    Marland Kitchen PROAIR HFA 108 (90 Base) MCG/ACT inhaler INHALE TWO PUFFS BY MOUTH EVERY 4 HOURS AS NEEDED FOR WHEEZING 17 g 0  . spironolactone (ALDACTONE) 25 MG tablet Take 12.5 mg by mouth 2 (two) times daily.     . tamsulosin (FLOMAX) 0.4 MG CAPS capsule Take 1 capsule (0.4 mg total) by mouth 2 (two) times daily after a meal. 180 capsule 1  . traZODone (DESYREL) 50 MG tablet Take 50 mg by mouth at bedtime.  5   No current facility-administered medications for this visit.   Allergies:  Patient has no known allergies.   Social History: The patient  reports that he quit smoking about 7 years ago. His smoking use included cigarettes. He has a 23.50 pack-year smoking history. He quit smokeless tobacco use about 5 years ago. He reports previous drug use. He reports that he does not drink alcohol.   Family History: The patient's family  history includes Breast cancer in his cousin; Diabetes in his sister; Heart attack in his father; Hypertension in his father, mother, sister, sister, and sister; Lung cancer in his mother; Pulmonary fibrosis in his sister.   ROS:  Please see the history of present illness. Otherwise, complete review of systems is positive for none.  All other systems are reviewed and negative.   Physical Exam: VS:  BP 122/68   Pulse 86   Ht 6\' 1"  (1.854 m)   Wt 244 lb (110.7 kg)   SpO2 90%   BMI 32.19 kg/m , BMI Body mass index is 32.19 kg/m.  Wt Readings from Last 3 Encounters:  01/07/20 244 lb (110.7 kg)  01/05/20 244 lb (110.7 kg)  12/30/19 247 lb (112 kg)    General: Patient appears comfortable at rest. Neck: Supple, no elevated JVP or carotid bruits, no thyromegaly. Lungs: Inspiratory and expiratory wheezing with prolonged expiratory phase on 2 L nasal cannula continuous, nonlabored breathing at rest. Cardiac: Irregularly irregular rate and rhythm, no S3 or significant systolic murmur, no pericardial rub. Extremities: 1+ pitting edema, distal pulses 2+. Skin: Warm and dry. Musculoskeletal: No kyphosis. Neuropsychiatric: Alert and oriented x3, affect grossly appropriate.  ECG:  EKG 07/02/2019 sinus rhythm rate of 95  Recent Labwork: 05/15/2019: B Natriuretic Peptide 48.0 07/01/2019: Magnesium 1.5 12/15/2019: TSH 4.684 01/05/2020: ALT 30; AST 20; BUN 24; Creatinine, Ser 1.21; Hemoglobin 12.3; Platelets 141; Potassium 3.5; Sodium 136     Component Value Date/Time   CHOL 100 06/03/2017 1124   TRIG 103 06/03/2017 1124   HDL 40 (L) 06/03/2017 1124   CHOLHDL 2.5 06/03/2017 1124   LDLCALC 41 06/03/2017 1124    Other Studies Reviewed Today:  Echocardiogram 02/02/2019 [facility]): Summary  1. Technically difficult study.  2. The left ventricular systolic function is mildly decreased, LVEF is  visually estimated at 45-50%.   Left Ventricle  The left ventricular systolicfunction is mildly  decreased, LVEF is visually  estimated at 45-50%.  Right Ventricle  Right ventricle is not well visualized.   Left Atrium  The left atrium is not well visualized but probably normal in size.   Right Atrium  The right atrium is not well visualized but probably normal in size.   Aortic Valve  The aortic valve is not well visualized.  There is no significant aortic regurgitation.   Pulmonic Valve  Pulmonary valve is not well visualized.   Mitral Valve  The mitral valve is not well visualized.  There is no significant mitral valve regurgitation.   Tricuspid Valve  The tricuspid valve is not well visualized.  There is no significant tricuspid regurgitation.   Pericardium/Pleural  There is no pericardial effusion.  Pericardial fat pad present.   Inferior Vena Cava  IVC size and inspiratory change suggest normal right atrial pressure. (0-5  mmHg).   Chest CTA 11/23/2019 [facility]): 1. Negative for pulmonary embolus.  2. Narrowing of the proximal right mainstem bronchus is accentuated  by expiratory phase imaging.  3. Post treatment volume loss in the right upper lobe.  4. Enlarging subpleural nodule in the lateral right upper lobe,  worrisome for disease recurrence/progression.  5. Previously seen 5 mm posterior left lower lobe nodule is not  readily appreciated.  6. Cirrhosis.  7. Aortic atherosclerosis (ICD10-I70.0). Coronary artery  calcification.  8. Emphysema (ICD10-J43.9).   Assessment and Plan:  1. COPD, severe ([place])   2. Nonischemic cardiomyopathy (HCC)   3. Persistent atrial fibrillation ([place])    1. COPD, severe (HCC)/recurrent lung CA. Severe COPD on continuous oxygen.  Recent hospital stay for COPD and CHF exacerbation.  Has recurrent lung CA being treated by Dr. Delton Herrera with immunotherapy.  Had a recent treatment at oncology center.  On continuous O2 at 2 L/min.  2. Nonischemic cardiomyopathy (HCC)/systolic heart  failure Recent hospital stay for CHF exacerbation/COPD exacerbation.  He was diuresed with IV Lasix.  Currently on Lasix 80 mg daily.  Continues with some lower extremity edema approximately 1+.  Continue Lasix 80 mg daily.  Get a follow-up basic metabolic panel and magnesium in 2 weeks.  Repeat echocardiogram to reassess LV function, diastolic function, valvular function.  Restart Toprol at a lower dose 25 mg daily.  Continue spironolactone 12.5 mg p.o. twice daily.  3. Persistent atrial fibrillation (HCC) Heart rate today 86 and irregularly irregular.  Continue continue Eliquis 5 mg p.o. twice daily.  Restart Toprol-XL at 25 mg p.o. daily.  He had previously been on Toprol-XL 100 mg but it was recently stopped that recent hospital visit.  Continue diltiazem XR 240 mg daily.  Continue Tikosyn 250 mcg p.o. twice daily.  Medication Adjustments/Labs and Tests Ordered: Current medicines are reviewed at length with the patient today.  Concerns regarding medicines are outlined above.   Disposition: Follow-up with Dr. Domenic Herrera or APP 1 month  Signed, Levell July, NP 01/07/2020 9:31 AM    [facility] [place], [place], [place] [address] Phone: [phone]; Fax: [phone]

## 2020-01-05 NOTE — Patient Instructions (Signed)
[facility] Discharge Instructions for Patients Receiving Chemotherapy   Beginning January 23rd 2017 lab work for the [facility] will be done in the  Main lab at [facility] on 1st floor. If you have a lab appointment with the [place] please come in thru the  Main Entrance and check in at the main information desk   Today you received the following chemotherapy agents Tecentriq. Follow-up as scheduled  To help prevent nausea and vomiting after your treatment, we encourage you to take your nausea medication   If you develop nausea and vomiting, or diarrhea that is not controlled by your medication, call the clinic.  The clinic phone number is [phone]. Office hours are Monday-Friday 8:30am-5:00pm.  BELOW ARE SYMPTOMS THAT SHOULD BE REPORTED IMMEDIATELY:  *FEVER GREATER THAN 101.0 F  *CHILLS WITH OR WITHOUT FEVER  NAUSEA AND VOMITING THAT IS NOT CONTROLLED WITH YOUR NAUSEA MEDICATION  *UNUSUAL SHORTNESS OF BREATH  *UNUSUAL BRUISING OR BLEEDING  TENDERNESS IN MOUTH AND THROAT WITH OR WITHOUT PRESENCE OF ULCERS  *URINARY PROBLEMS  *BOWEL PROBLEMS  UNUSUAL RASH Items with * indicate a potential emergency and should be followed up as soon as possible. If you have an emergency after office hours please contact your primary care physician or go to the nearest emergency department.  Please call the clinic during office hours if you have any questions or concerns.   You may also contact the Patient Navigator at [phone] should you have any questions or need assistance in obtaining follow up care.      Resources For Cancer Patients and their Caregivers ? American Cancer Society: Can assist with transportation, wigs, general needs, runs Look Good Feel Better.        317-242-8409 ? Cancer Care: Provides financial assistance, online support groups, medication/co-pay assistance.  1-800-813-HOPE (916) 190-6267) ? [place] Assists [place] cancer patients and their families through emotional , educational and financial support.  5304621241 ? [place] DSS Where to apply for food stamps, Medicaid and utility assistance. (702)450-6328 ? [facility]: Transportation to medical appointments. 857 462 9944 ? Social Security Administration: May apply for disability if have a Stage IV cancer. (801)226-5765 3192308967 ? [employer], Disability and Transit Services: Assists with nutrition, care and transit needs. [phone]

## 2020-01-05 NOTE — Telephone Encounter (Signed)
These were his notes from discharged after recent stay at [facility].  You are correct in that metoprolol was stopped.  However it appears the Lasix dose was decreased to 20 mg daily.  His blood pressure was slightly low that may have been the reason the metoprolol was stopped possibly temporarily.  Please call the nursing home and ask him if his heart rate is still elevated and if so what is the rate.  Also have them check his blood pressure.  Please remind them the Lasix was reduced to 20 mg but it was not stopped.  Thank you.  Below are the discharge instructions for the medications they are concerned about from his recent hospital stay.  Thank you  Your Medication List   STOP taking these medications  doxycycline 100 MG capsule Commonly known as: VIBRAMYCIN  metoprolol succinate 100 MG 24 hr tablet Commonly known as: TOPROL XL  predniSONE 20 MG tablet Commonly known as: DELTASONE   CHANGE how you take these medications  furosemide 40 MG tablet Commonly known as: LASIX Take 0.5 tablets (20 mg total) by mouth daily. What changed:   how much to take  how to take this  when to take this  additional instructions

## 2020-01-05 NOTE — Progress Notes (Signed)
[name] reviewed with and pt seen by Dr. Burr Medico and pt approved for Tecentriq infusion today with 10 units of insulin ordered for Glucose of 382  per MD                                                Chase Herrera tolerated Tecentriq infusion well without complaints or incident. VSS upon discharge. Pt discharged via wheelchair in satisfactory condition

## 2020-01-05 NOTE — Patient Instructions (Signed)
[facility] at [facility] Discharge Instructions  Labs drawn from portacath today   Thank you for choosing [facility] at [facility] to provide your oncology and hematology care.  To afford each patient quality time with our provider, please arrive at least 15 minutes before your scheduled appointment time.   If you have a lab appointment with the Cancer Center please come in thru the Main Entrance and check in at the main information desk.  You need to re-schedule your appointment should you arrive 10 or more minutes late.  We strive to give you quality time with our providers, and arriving late affects you and other patients whose appointments are after yours.  Also, if you no show three or more times for appointments you may be dismissed from the clinic at the providers discretion.     Again, thank you for choosing [facility].  Our hope is that these requests will decrease the amount of time that you wait before being seen by our physicians.       _____________________________________________________________  Should you have questions after your visit to [facility], please contact our office at [phone] and follow the prompts.  Our office hours are 8:00 a.m. and 4:30 p.m. Monday - Friday.  Please note that voicemails left after 4:00 p.m. may not be returned until the following business day.  We are closed weekends and major holidays.  You do have access to a nurse 24-7, just call the main number to the clinic [phone] and do not press any options, hold on the line and a nurse will answer the phone.    For prescription refill requests, have your pharmacy contact our office and allow 72 hours.    Due to Covid, you will need to wear a mask upon entering the hospital. If you do not have a mask, a mask will be given to you at the Main Entrance upon arrival. For doctor visits, patients may have 1 support person age 18  or older with them. For treatment visits, patients can not have anyone with them due to social distancing guidelines and our immunocompromised population.     

## 2020-01-05 NOTE — Telephone Encounter (Signed)
New message    Chase Herrera with [facility] called the patient was discharged to them yesterday and they have questions on why the hospital stopped the patient metoprolol succinate (TOPROL-XL) 100 MG 24 hr tablet and cut his furosemide (LASIX) 40 MG tablet in half? When he was being treated in hospital for CHF ? Was this done by mistake ?

## 2020-01-05 NOTE — Telephone Encounter (Signed)
Nursing Home questions why Metoprolol was stopped and Lasix decreased. Seen at [facility] and has apt in two days with Katina Dung at the [place] office. They request that I send Katina Dung a message for resolution until his appointment.

## 2020-01-06 NOTE — Telephone Encounter (Signed)
Spoke with Murray Hodgkins from [facility] and relayed the message from Katina Dung, NP regarding medication instructions for Chase Herrera. She verbalized understanding.

## 2020-01-07 ENCOUNTER — Ambulatory Visit (INDEPENDENT_AMBULATORY_CARE_PROVIDER_SITE_OTHER): Payer: Medicare Other | Admitting: Family Medicine

## 2020-01-07 ENCOUNTER — Telehealth: Payer: Self-pay | Admitting: Family Medicine

## 2020-01-07 ENCOUNTER — Encounter: Payer: Self-pay | Admitting: Family Medicine

## 2020-01-07 VITALS — BP 122/68 | HR 86 | Ht 73.0 in | Wt 244.0 lb

## 2020-01-07 DIAGNOSIS — I5032 Chronic diastolic (congestive) heart failure: Secondary | ICD-10-CM | POA: Diagnosis not present

## 2020-01-07 DIAGNOSIS — J449 Chronic obstructive pulmonary disease, unspecified: Secondary | ICD-10-CM

## 2020-01-07 DIAGNOSIS — I4819 Other persistent atrial fibrillation: Secondary | ICD-10-CM | POA: Diagnosis not present

## 2020-01-07 DIAGNOSIS — I428 Other cardiomyopathies: Secondary | ICD-10-CM | POA: Diagnosis not present

## 2020-01-07 MED ORDER — METOPROLOL SUCCINATE ER 25 MG PO TB24: 25.0000 mg | ORAL_TABLET | Freq: Every day | ORAL | 1 refills | Status: AC

## 2020-01-07 NOTE — Patient Instructions (Signed)
Your physician recommends that you schedule a follow-up appointment in: [place], NP  Your physician has recommended you make the following change in your medication:   TAKE TOPROL XL 25 MG DAILY   Your physician recommends that you return for lab work in: [place] BMP/MG  Your physician has requested that you have an echocardiogram. Echocardiography is a painless test that uses sound waves to create images of your heart. It provides your doctor with information about the size and shape of your heart and how well your heart's chambers and valves are working. This procedure takes approximately one hour. There are no restrictions for this procedure.  Thank you for choosing [facility]!!

## 2020-01-07 NOTE — Telephone Encounter (Signed)
Spoke with [facility] and confirmed that pt was seen today by Katina Dung, NP and Toprol XL was changed to 25 mg daily

## 2020-01-07 NOTE — Telephone Encounter (Signed)
New message     Patient is there to pick up medication and they have question on the change in the dosage ? Why it went from 100 to 25?  Is this correct

## 2020-01-11 ENCOUNTER — Ambulatory Visit: Payer: Medicare Other | Admitting: Family Medicine

## 2020-01-14 ENCOUNTER — Other Ambulatory Visit: Payer: Medicare Other

## 2020-01-21 ENCOUNTER — Other Ambulatory Visit: Payer: Self-pay | Admitting: *Deleted

## 2020-01-21 DIAGNOSIS — C7931 Secondary malignant neoplasm of brain: Secondary | ICD-10-CM

## 2020-01-21 MED ORDER — SODIUM CHLORIDE 0.9% FLUSH: 10.0000 mL | INTRAVENOUS | Status: DC | PRN

## 2020-01-21 MED ORDER — HEPARIN SOD (PORK) LOCK FLUSH 100 UNIT/ML IV SOLN: 500.0000 [IU] | Freq: Once | INTRAVENOUS | Status: DC

## 2020-01-25 ENCOUNTER — Ambulatory Visit (HOSPITAL_COMMUNITY): Admission: RE | Admit: 2020-01-25 | Payer: Medicare Other | Source: Ambulatory Visit

## 2020-01-26 ENCOUNTER — Other Ambulatory Visit (HOSPITAL_COMMUNITY): Payer: Medicare Other

## 2020-01-26 ENCOUNTER — Ambulatory Visit (HOSPITAL_COMMUNITY): Payer: Medicare Other

## 2020-01-26 ENCOUNTER — Ambulatory Visit (HOSPITAL_COMMUNITY): Payer: Medicare Other | Admitting: Hematology

## 2020-02-05 ENCOUNTER — Ambulatory Visit (HOSPITAL_COMMUNITY)
Admission: RE | Admit: 2020-02-05 | Discharge: 2020-02-05 | Disposition: A | Discharge status: Home / Self Care | Payer: Medicare Other | Source: Ambulatory Visit | Attending: Hematology | Admitting: Hematology

## 2020-02-05 ENCOUNTER — Other Ambulatory Visit: Payer: Self-pay

## 2020-02-05 DIAGNOSIS — C3491 Malignant neoplasm of unspecified part of right bronchus or lung: Secondary | ICD-10-CM | POA: Insufficient documentation

## 2020-02-05 LAB — POCT I-STAT CREATININE: Creatinine, Ser: 0.9 mg/dL (ref 0.61–1.24)

## 2020-02-05 MED ORDER — IOHEXOL 300 MG/ML  SOLN
100.0000 mL | Freq: Once | INTRAMUSCULAR | Status: AC | PRN
  Administered 2020-02-05: 100 mL via INTRAVENOUS

## 2020-02-08 ENCOUNTER — Ambulatory Visit: Payer: Medicare Other | Admitting: Family Medicine

## 2020-02-09 ENCOUNTER — Ambulatory Visit (HOSPITAL_COMMUNITY): Payer: Medicare Other

## 2020-02-09 ENCOUNTER — Other Ambulatory Visit (HOSPITAL_COMMUNITY): Payer: Medicare Other

## 2020-02-09 ENCOUNTER — Ambulatory Visit (HOSPITAL_COMMUNITY): Payer: Medicare Other | Admitting: Hematology

## 2020-02-10 ENCOUNTER — Ambulatory Visit: Payer: Medicare Other | Admitting: Family Medicine

## 2020-02-12 ENCOUNTER — Ambulatory Visit (HOSPITAL_COMMUNITY)
Admission: RE | Admit: 2020-02-12 | Discharge: 2020-02-12 | Disposition: A | Discharge status: Home / Self Care | Payer: Medicare Other | Source: Ambulatory Visit | Attending: Family Medicine | Admitting: Family Medicine

## 2020-02-12 ENCOUNTER — Other Ambulatory Visit: Payer: Self-pay

## 2020-02-12 DIAGNOSIS — I5032 Chronic diastolic (congestive) heart failure: Secondary | ICD-10-CM | POA: Insufficient documentation

## 2020-02-12 LAB — ECHOCARDIOGRAM COMPLETE
Area-P 1/2: 3.11 cm2
S' Lateral: 4.2 cm

## 2020-02-12 NOTE — Progress Notes (Signed)
*  PRELIMINARY RESULTS* Echocardiogram 2D Echocardiogram has been performed.  Leavy Cella 02/12/2020, 3:23 PM

## 2020-02-15 ENCOUNTER — Telehealth: Payer: Self-pay | Admitting: *Deleted

## 2020-02-15 NOTE — Telephone Encounter (Signed)
-----   Message from Erma Heritage, [place] sent at 02/13/2020  8:48 AM EST ----- Ordered by Katina Dung, NP - Please let the patient know the pumping function of his heart has improved and is now in a normal range with EF at 55-60%. No regional wall motion abnormalities. He did have mild thickness of the heart muscle. No significant valve abnormalities. Please forward a copy of results to [facility].

## 2020-02-15 NOTE — Telephone Encounter (Signed)
Laurine Blazer, LPN  2/39/5320 2:33 AM EST Back to Top     No answer.

## 2020-02-15 NOTE — Telephone Encounter (Signed)
Laurine Blazer, LPN  2/33/6122 4:49 AM EST Back to Top     Notified, copy to pcp.

## 2020-02-17 NOTE — Progress Notes (Signed)
  Radiation Oncology         [phone] ________________________________  Name: Chase Herrera MRN: [id number]  Date: 12/04/2019  DOB: [birth year]  End of Treatment Note  Diagnosis:   Brain metastasis     Indication for treatment:  palliative       Radiation treatment dates:   12/04/19  Site/dose:    VMAT - 4 fields combined plan 1.  The left occipital/temporal 11 mm tumor was treated to a dose of 20 Gray in 1 fraction 2.  The right caudate 3 mm tumor was treated to a dose of 20 Gray in 1 fraction.   Narrative: The patient tolerated radiation treatment well.   There were no signs of acute toxicity after treatment.  Plan: The patient has completed radiation treatment. The patient will return to radiation oncology clinic for routine followup in one month. I advised the patient to call or return sooner if they have any questions or concerns related to their recovery or treatment. ________________________________  ------------------------------------------------  Jodelle Gross, MD, PhD

## 2020-02-18 ENCOUNTER — Telehealth: Payer: Self-pay | Admitting: Family Medicine

## 2020-02-18 NOTE — Progress Notes (Signed)
This is a telemedicine visit Patient location: Home Provider location: Office  Cardiology Office Note  Date: 02/19/2020   ID: Saba, Neuman 13-Oct-1950, MRN [id number]  PCP:  Gwenlyn Saran, [place]  Cardiologist:  [name], MD Electrophysiologist:  None   Chief Complaint: Hospital follow up CHF  History of Present Illness: Chase Herrera is a 70 y.o. male with a history of atrial fibrillation, COPD, Lung CA, DM2, CHF, HTN.  Last encounter with Dr [name] 12/30/2019: Reported no palpitations and was tolerating his medications well. He was following with pulmonary for underlying COPD and Ling Ca. He was continuing on continuous nasal 02 and in a wheelchair. He did not report ant bleeding on eliquis. Last echocardiogram in January demonstrated EF 45-50%. CHA2DS2-VASC score 5. He was continuing Tikosyn, diltiazem CD, and Toprol XL. Rate was controlled and regular with no complaints of palpitations.   He presented to [facility] ED 01/03/2020 with complaint of having trouble breathing and was admitted for exacerbation of chronic CHF and COPD.  He stated he had not been taking his fluid pill.  BNP was 1655.  He was diuresed with IV Lasix and discharged on Lasix 20 mg p.o. twice daily.   He was here last visit for hospital follow-up status post recent hospital stay at [facility] overnight in the emergency department per his statement.  He stated he was never admitted.  He received IV diuresis. Current dose of Lasix is 80 mg daily.  He continued to have some lower extremity edema. On continuous O2 for severe COPD.  He has recurrence of his lung cancer and being treated by Dr. Delton Coombes with immunotherapy.  He stated his metoprolol was stopped during recent hospital stay.  During his recent ED stay the EKG noted he was in atrial flutter with variable block with a rate of 114.  Heart rate today is 86.  I spoke to him via phone today.  He states his primary issue  is problems with breathing.  States minimal activity causes him to become short of breath and it takes him a few minutes to recover after resting.  Otherwise he denies any anginal symptoms, palpitations or arrhythmias, orthostatic symptoms.  States he still has some lower extremity edema.  He continues Lasix 80 mg daily.  According to today's weight he has lost about 4 pounds since last visit.  He plans to follow-up with Dr. Delton Coombes for his lung CA recurrence and continuing immunotherapy.  His most recent echo demonstrated his EF had returned to normal with EF of 55 to 60% by echo on 02/12/2020   Past Medical History:  Diagnosis Date  . Alcohol abuse    Quit 08/23/11  . Arthritis   . Atrial fibrillation ([place])   . Atrial flutter ([place])    CTI ablation by Dr [name] 10/2011  . Bacteremia due to Gram-positive bacteria 11/29/2016  . CHF (congestive heart failure) ([place])   . COPD (chronic obstructive pulmonary disease) ([place])   . Depression   . Essential hypertension   . History of cardiomyopathy    LVEF 25-30% 08/2011 with subsequent normalization  . History of kidney stones   . Obesity   . Prostate enlargement   . Small cell lung cancer ([place])    Right upper lobe - follows with [facility]  . Type 2 diabetes mellitus ([place])     Past Surgical History:  Procedure Laterality Date  . APPENDECTOMY    . ATRIAL ABLATION SURGERY  11/06/2011   CTI ablation for atrial flutter by Dr [name]  . ATRIAL FLUTTER ABLATION N/A 11/06/2011   Procedure: ATRIAL FLUTTER ABLATION;  Surgeon: Thompson Grayer, MD;  Location: [facility] CATH LAB;  Service: Cardiovascular;  Laterality: N/A;  . Cataracts    . COLONOSCOPY WITH PROPOFOL N/A 06/06/2017   Procedure: COLONOSCOPY WITH PROPOFOL;  Surgeon: [name], MD;  Location: [facility] ENDO SUITE;  Service: Endoscopy;  Laterality: N/A;  1:15pm  . IR GENERIC HISTORICAL  04/09/2016   IR US GUIDE VASC ACCESS RIGHT 04/09/2016 [name], DO [facility]-INTERV RAD  . IR GENERIC HISTORICAL   04/09/2016   IR FLUORO GUIDE PORT INSERTION RIGHT 04/09/2016 [name], DO [facility]-INTER[facility] RAD  . PORT-A-CATH REMOVAL N/A 04/24/2017   Procedure: MINOR REMOVAL PORT-A-CATH;  Surgeon: [name], MD;  Location: [facility] ORS;  Service: General;  Laterality: N/A;  . PORTACATH PLACEMENT Left 04/22/2019   Procedure: INSERTION PORT-A-CATH;  Surgeon: Aviva Signs, MD;  Location: [facility] ORS;  Service: General;  Laterality: Left;  . TONSILLECTOMY      Current Outpatient Medications  Medication Sig Dispense Refill  . acetaminophen (TYLENOL) 500 MG tablet Take 1,000 mg by mouth every 6 (six) hours as needed for moderate pain or headache.     Marland Kitchen apixaban (ELIQUIS) 5 MG TABS tablet Take 5 mg by mouth 2 (two) times daily.     Marland Kitchen AQUALANCE LANCETS 30G MISC USE TO check blood glucose twice daily  99  . Artificial Saliva (BIOTENE DRY MOUTH MOISTURIZING) SOLN Apply 1 Dose topically as needed (dry mouth).     Marland Kitchen atorvastatin (LIPITOR) 20 MG tablet Take 1 tablet (20 mg total) by mouth daily. 90 tablet 3  . benzonatate (TESSALON) 100 MG capsule Take 2 capsules (200 mg total) by mouth every 8 (eight) hours as needed. 30 capsule 1  . Carboxymethylcellul-Glycerin (LUBRICATING EYE DROPS OP) Place 1 drop into the left eye daily as needed (dry eyes).     Marland Kitchen Co-Enzyme Q10 200 MG CAPS See admin instructions.    Marland Kitchen diltiazem (DILACOR XR) 240 MG 24 hr capsule Take 240 mg by mouth daily.    Marland Kitchen dofetilide (TIKOSYN) 250 MCG capsule Take 1 capsule (250 mcg total) by mouth 2 (two) times daily.    . fluticasone (FLONASE) 50 MCG/ACT nasal spray Place 2 sprays into both nostrils daily. 16 g 6  . Fluticasone-Umeclidin-Vilant (TRELEGY ELLIPTA) 100-62.5-25 MCG/INH AEPB Inhale 1 puff into the lungs daily. 60 each 6  . furosemide (LASIX) 80 MG tablet Take 80 mg by mouth daily.    Marland Kitchen gabapentin (NEURONTIN) 300 MG capsule Take 600 mg by mouth 2 (two) times daily.     Marland Kitchen GLOBAL EASE INJECT PEN NEEDLES 31G X 8 MM MISC Inject into the skin 3 (three) times  daily.    Marland Kitchen guaifenesin (ROBITUSSIN) 100 MG/5ML syrup Take 200 mg by mouth at bedtime as needed for cough.     Marland Kitchen HM SENNA 8.6 MG tablet Take 2 tablets by mouth at bedtime.    Marland Kitchen HYDROcodone-acetaminophen (NORCO/VICODIN) 5-325 MG tablet Take 1 tablet by mouth at bedtime as needed. 30 tablet 0  . insulin lispro (HUMALOG) 100 UNIT/ML injection Inject 2-20 Units into the skin 3 (three) times daily with meals. Sliding scale    . Insulin Syringe-Needle U-100 28G X 1/2" 1 ML MISC 1 each by Does not apply route 3 (three) times daily. 100 each 3  . ipratropium-albuterol (DUONEB) 0.5-2.5 (3) MG/3ML SOLN Take 3 mLs by nebulization every 4 (  four) hours as needed (shortness of breath).     El[place] Guise Devices (ADJUSTABLE LANCING DEVICE) MISC TO check blood glucose daily    . LANTUS SOLOSTAR 100 UNIT/ML Solostar Pen INJECT 20 UNITS UNDER THE SKIN EVERY MORNING AND INJECT 20 UNITS UNDER THE SKIN EVERY EVENING (Patient taking differently: Inject 20 Units into the skin 2 (two) times daily.) 45 mL 0  . levalbuterol (XOPENEX) 0.63 MG/3ML nebulizer solution Inhale 3 mLs (0.63 mg total) into the lungs 4 (four) times daily. 3 mL 0  . Lidocaine (SALONPAS PAIN RELIEVING EX) Apply 1 patch topically daily.     Marland Kitchen lidocaine-prilocaine (EMLA) cream Apply a small amount to port a cath site and cover with plastic wrap 1 hour prior to chemotherapy appointments 30 g 3  . metFORMIN (GLUCOPHAGE) 500 MG tablet Take 500 mg by mouth in the morning and at bedtime.    . metoprolol succinate (TOPROL XL) 25 MG 24 hr tablet Take 1 tablet (25 mg total) by mouth daily. 90 tablet 1  . Misc. Devices MISC Please provide patient with an electric wheelchair. 1 each 0  . Multiple Vitamin (MULTIVITAMIN WITH MINERALS) TABS tablet Take 1 tablet by mouth daily.    . potassium chloride (KLOR-CON) 10 MEQ tablet Take 10 mEq by mouth 2 (two) times daily.    . pregabalin (LYRICA) 150 MG capsule Take 150 mg by mouth in the morning and at bedtime.    Marland Kitchen PROAIR HFA  108 (90 Base) MCG/ACT inhaler INHALE TWO PUFFS BY MOUTH EVERY 4 HOURS AS NEEDED FOR WHEEZING 17 g 0  . spironolactone (ALDACTONE) 25 MG tablet Take 12.5 mg by mouth 2 (two) times daily.     . tamsulosin (FLOMAX) 0.4 MG CAPS capsule Take 1 capsule (0.4 mg total) by mouth 2 (two) times daily after a meal. 180 capsule 1  . traZODone (DESYREL) 50 MG tablet Take 50 mg by mouth at bedtime.  5   No current facility-administered medications for this visit.   Facility-Administered Medications Ordered in Other Visits  Medication Dose Route Frequency Provider Last Rate Last Admin  . heparin lock flush 100 unit/mL  500 Units Intravenous Once Kyung Rudd, MD      . sodium chloride flush (NS) 0.9 % injection 10 mL  10 mL Intravenous PRN Kyung Rudd, MD       Allergies:  Patient has no known allergies.   Social History: The patient  reports that he quit smoking about 7 years ago. His smoking use included cigarettes. He has a 23.50 pack-year smoking history. He quit smokeless tobacco use about 5 years ago. He reports previous drug use. He reports that he does not drink alcohol.   Family History: The patient's family history includes Breast cancer in his cousin; Diabetes in his sister; Heart attack in his father; Hypertension in his father, mother, sister, sister, and sister; Lung cancer in his mother; Pulmonary fibrosis in his sister.   ROS:  Please see the history of present illness. Otherwise, complete review of systems is positive for none.  All other systems are reviewed and negative.   Physical Exam: VS:  BP (!) 153/80   Pulse 92   Ht 6\' 1"  (1.854 m)   Wt 240 lb (108.9 kg)   BMI 31.66 kg/m , BMI Body mass index is 31.66 kg/m.  Wt Readings from Last 3 Encounters:  02/19/20 240 lb (108.9 kg)  01/07/20 244 lb (110.7 kg)  01/05/20 244 lb (110.7 kg)   Speech pattern  was normal.  No obvious dyspnea, cough, or wheezing noted during conversation.    ECG:  EKG 07/02/2019 sinus rhythm rate of  95  Recent Labwork: 05/15/2019: B Natriuretic Peptide 48.0 07/01/2019: Magnesium 1.5 12/15/2019: TSH 4.684 01/05/2020: ALT 30; AST 20; BUN 24; Hemoglobin 12.3; Platelets 141; Potassium 3.5; Sodium 136 02/05/2020: Creatinine, Ser 0.90     Component Value Date/Time   CHOL 100 06/03/2017 1124   TRIG 103 06/03/2017 1124   HDL 40 (L) 06/03/2017 1124   CHOLHDL 2.5 06/03/2017 1124   LDLCALC 41 06/03/2017 1124    Other Studies Reviewed Today:  Echocardiogram 02/12/2020 1. Left ventricular ejection fraction, by estimation, is 55 to 60%. The left ventricle has normal function. The left ventricle has no regional wall motion abnormalities. There is mild left ventricular hypertrophy. Left ventricular diastolic parameters are indeterminate. 2. Right ventricular systolic function is normal. The right ventricular size is normal. Tricuspid regurgitation signal is inadequate for assessing PA pressure. 3. The mitral valve is grossly normal. No evidence of mitral valve regurgitation. 4. The aortic valve is tricuspid. Aortic valve regurgitation is not visualized. 5. The inferior vena cava is normal in size with greater than 50% respiratory variability, suggesting right atrial pressure of 3 mmHg.   Echocardiogram 02/02/2019 [facility]): Summary  1. Technically difficult study.  2. The left ventricular systolic function is mildly decreased, LVEF is  visually estimated at 45-50%.   Left Ventricle  The left ventricular systolicfunction is mildly decreased, LVEF is visually  estimated at 45-50%.   Right Ventricle  Right ventricle is not well visualized.   Left Atrium  The left atrium is not well visualized but probably normal in size.   Right Atrium  The right atrium is not well visualized but probably normal in size.   Aortic Valve  The aortic valve is not well visualized.  There is no significant aortic regurgitation.   Pulmonic Valve  Pulmonary valve is not well visualized.    Mitral Valve  The mitral valve is not well visualized.  There is no significant mitral valve regurgitation.   Tricuspid Valve  The tricuspid valve is not well visualized.  There is no significant tricuspid regurgitation.   Pericardium/Pleural  There is no pericardial effusion.  Pericardial fat pad present.   Inferior Vena Cava  IVC size and inspiratory change suggest normal right atrial pressure. (0-5  mmHg).   Chest CTA 11/23/2019 [facility]): 1. Negative for pulmonary embolus.  2. Narrowing of the proximal right mainstem bronchus is accentuated  by expiratory phase imaging.  3. Post treatment volume loss in the right upper lobe.  4. Enlarging subpleural nodule in the lateral right upper lobe,  worrisome for disease recurrence/progression.  5. Previously seen 5 mm posterior left lower lobe nodule is not  readily appreciated.  6. Cirrhosis.  7. Aortic atherosclerosis (ICD10-I70.0). Coronary artery  calcification.  8. Emphysema (ICD10-J43.9).   Assessment and Plan:   1. COPD, severe (HCC)/recurrent lung CA. Severe COPD on continuous oxygen.  Recent hospital stay for COPD and CHF exacerbation.  Has recurrent lung CA being treated by Dr. Delton Coombes with immunotherapy.  Had a recent treatment at oncology center.  On continuous O2 at 2 L/min.  Continue to follow with oncology.  2. Nonischemic cardiomyopathy (HCC)/systolic heart failure Recent repeat echocardiogram demonstrated improvement of EF to 55 to 60% on 02/12/2020.  Currently on Lasix 80 mg daily.  States she still has some lower extremity edema.  Continue Lasix 80 mg daily.  Continue Toprol at  25 mg daily.  Continue spironolactone 12.5 mg p.o. twice daily.   3. Persistent atrial fibrillation (HCC) Heart rate today 92.  Continue continue Eliquis 5 mg p.o. twice daily.  Continue Toprol-XL at 25 mg p.o. daily.  He had previously been on Toprol-XL 100 mg but it was recently stopped at recent hospital visit.   Continue diltiazem XR 240 mg daily.  Continue Tikosyn 250 mcg p.o. twice daily.  Medication Adjustments/Labs and Tests Ordered: Current medicines are reviewed at length with the patient today.  Concerns regarding medicines are outlined above.   Time spent during phone call 10 minutes.  Disposition: Follow-up with Dr. [name] or APP 1 month  Signed, Levell July, NP 02/19/2020 8:51 AM    [facility], [place], [place] [address] Phone: [phone]; Fax: [phone]

## 2020-02-18 NOTE — Telephone Encounter (Signed)
  Patient Consent for Virtual Visit         Chase Herrera has provided verbal consent on 02/18/2020 for a virtual visit (video or telephone).   CONSENT FOR VIRTUAL VISIT FOR:  Chase Herrera  By participating in this virtual visit I agree to the following:  I hereby voluntarily request, consent and authorize [facility] and its employed or contracted physicians, physician assistants, nurse practitioners or other licensed health care professionals (the Practitioner), to provide me with telemedicine health care services (the "Services") as deemed necessary by the treating Practitioner. I acknowledge and consent to receive the Services by the Practitioner via telemedicine. I understand that the telemedicine visit will involve communicating with the Practitioner through live audiovisual communication technology and the disclosure of certain medical information by electronic transmission. I acknowledge that I have been given the opportunity to request an in-person assessment or other available alternative prior to the telemedicine visit and am voluntarily participating in the telemedicine visit.  I understand that I have the right to withhold or withdraw my consent to the use of telemedicine in the course of my care at any time, without affecting my right to future care or treatment, and that the Practitioner or I may terminate the telemedicine visit at any time. I understand that I have the right to inspect all information obtained and/or recorded in the course of the telemedicine visit and may receive copies of available information for a reasonable fee.  I understand that some of the potential risks of receiving the Services via telemedicine include:  Marland Kitchen Delay or interruption in medical evaluation due to technological equipment failure or disruption; . Information transmitted may not be sufficient (e.g. poor resolution of images) to allow for appropriate medical decision making by the Practitioner;  and/or  . In rare instances, security protocols could fail, causing a breach of personal health information.  Furthermore, I acknowledge that it is my responsibility to provide information about my medical history, conditions and care that is complete and accurate to the best of my ability. I acknowledge that Practitioner's advice, recommendations, and/or decision may be based on factors not within their control, such as incomplete or inaccurate data provided by me or distortions of diagnostic images or specimens that may result from electronic transmissions. I understand that the practice of medicine is not an exact science and that Practitioner makes no warranties or guarantees regarding treatment outcomes. I acknowledge that a copy of this consent can be made available to me via my patient portal ([facility]), or I can request a printed copy by calling the office of [facility].    I understand that my insurance will be billed for this visit.   I have read or had this consent read to me. . I understand the contents of this consent, which adequately explains the benefits and risks of the Services being provided via telemedicine.  . I have been provided ample opportunity to ask questions regarding this consent and the Services and have had my questions answered to my satisfaction. . I give my informed consent for the services to be provided through the use of telemedicine in my medical care

## 2020-02-19 ENCOUNTER — Ambulatory Visit: Payer: Medicare Other | Admitting: Family Medicine

## 2020-02-19 ENCOUNTER — Encounter: Payer: Self-pay | Admitting: Family Medicine

## 2020-02-19 ENCOUNTER — Telehealth (INDEPENDENT_AMBULATORY_CARE_PROVIDER_SITE_OTHER): Payer: Medicare Other | Admitting: Family Medicine

## 2020-02-19 VITALS — BP 153/80 | HR 92 | Ht 73.0 in | Wt 240.0 lb

## 2020-02-19 DIAGNOSIS — I4819 Other persistent atrial fibrillation: Secondary | ICD-10-CM | POA: Diagnosis not present

## 2020-02-19 DIAGNOSIS — J449 Chronic obstructive pulmonary disease, unspecified: Secondary | ICD-10-CM | POA: Diagnosis not present

## 2020-02-19 DIAGNOSIS — I428 Other cardiomyopathies: Secondary | ICD-10-CM

## 2020-02-19 NOTE — Patient Instructions (Signed)
Medication Instructions:  Continue all current medications.   Labwork: none  Testing/Procedures: none  Follow-Up: 6 months   Any Other Special Instructions Will Be Listed Below (If Applicable).   If you need a refill on your cardiac medications before your next appointment, please call your pharmacy.  

## 2020-02-23 ENCOUNTER — Telehealth: Payer: Self-pay

## 2020-02-23 NOTE — Telephone Encounter (Signed)
Referral given to Manuela Schwartz to schedule ov.

## 2020-02-23 NOTE — Telephone Encounter (Signed)
-----   Message from [name], MD sent at 02/19/2020 11:39 AM EST ----- There must be a miscommunication somewhere along the way.  It would be best just to bring him back to the office and reassess.  He may not ever need another colonoscopy given his comorbidities.  Thanks for asking. ----- Message ----- From: Claudina Lick, LPN Sent: 05/05/2438   4:36 PM EST To: [name], MD  Dr.Rourk, I got a referral on this pt, pcp said he needs to have yearly tcs's. His last tcs was 2019 and your recommendations were next tcs in 5 years. Pt does have a hx of lung cancer and brain mets. Can you review his chart, when you get time and let me know if you think he is due? Or would it be better if I just bring him in for an office visit to figure it out?

## 2020-03-01 ENCOUNTER — Encounter: Payer: Self-pay | Admitting: Internal Medicine

## 2020-03-01 NOTE — Telephone Encounter (Signed)
OV made and appt letter mailed °

## 2020-03-07 ENCOUNTER — Inpatient Hospital Stay (HOSPITAL_COMMUNITY): Payer: Medicare Other | Attending: Hematology

## 2020-03-07 ENCOUNTER — Inpatient Hospital Stay (HOSPITAL_COMMUNITY): Payer: Medicare Other

## 2020-03-07 ENCOUNTER — Inpatient Hospital Stay (HOSPITAL_BASED_OUTPATIENT_CLINIC_OR_DEPARTMENT_OTHER): Payer: Medicare Other | Admitting: Hematology

## 2020-03-07 ENCOUNTER — Other Ambulatory Visit: Payer: Self-pay

## 2020-03-07 VITALS — BP 120/60 | HR 90 | Temp 96.8°F | Resp 20 | Wt 250.0 lb

## 2020-03-07 VITALS — BP 125/74 | HR 125 | Temp 97.0°F | Resp 20

## 2020-03-07 DIAGNOSIS — Z79899 Other long term (current) drug therapy: Secondary | ICD-10-CM | POA: Insufficient documentation

## 2020-03-07 DIAGNOSIS — N189 Chronic kidney disease, unspecified: Secondary | ICD-10-CM | POA: Diagnosis not present

## 2020-03-07 DIAGNOSIS — Z5112 Encounter for antineoplastic immunotherapy: Secondary | ICD-10-CM | POA: Insufficient documentation

## 2020-03-07 DIAGNOSIS — R0602 Shortness of breath: Secondary | ICD-10-CM | POA: Diagnosis not present

## 2020-03-07 DIAGNOSIS — R609 Edema, unspecified: Secondary | ICD-10-CM | POA: Insufficient documentation

## 2020-03-07 DIAGNOSIS — Z836 Family history of other diseases of the respiratory system: Secondary | ICD-10-CM | POA: Diagnosis not present

## 2020-03-07 DIAGNOSIS — C3491 Malignant neoplasm of unspecified part of right bronchus or lung: Secondary | ICD-10-CM

## 2020-03-07 DIAGNOSIS — Z803 Family history of malignant neoplasm of breast: Secondary | ICD-10-CM | POA: Diagnosis not present

## 2020-03-07 DIAGNOSIS — C3411 Malignant neoplasm of upper lobe, right bronchus or lung: Secondary | ICD-10-CM | POA: Insufficient documentation

## 2020-03-07 DIAGNOSIS — Z833 Family history of diabetes mellitus: Secondary | ICD-10-CM | POA: Insufficient documentation

## 2020-03-07 DIAGNOSIS — M545 Low back pain, unspecified: Secondary | ICD-10-CM | POA: Insufficient documentation

## 2020-03-07 DIAGNOSIS — J449 Chronic obstructive pulmonary disease, unspecified: Secondary | ICD-10-CM | POA: Insufficient documentation

## 2020-03-07 DIAGNOSIS — I4891 Unspecified atrial fibrillation: Secondary | ICD-10-CM | POA: Insufficient documentation

## 2020-03-07 DIAGNOSIS — Z8249 Family history of ischemic heart disease and other diseases of the circulatory system: Secondary | ICD-10-CM | POA: Insufficient documentation

## 2020-03-07 DIAGNOSIS — R200 Anesthesia of skin: Secondary | ICD-10-CM | POA: Diagnosis not present

## 2020-03-07 DIAGNOSIS — Z87891 Personal history of nicotine dependence: Secondary | ICD-10-CM | POA: Insufficient documentation

## 2020-03-07 DIAGNOSIS — I5030 Unspecified diastolic (congestive) heart failure: Secondary | ICD-10-CM | POA: Insufficient documentation

## 2020-03-07 DIAGNOSIS — R059 Cough, unspecified: Secondary | ICD-10-CM | POA: Insufficient documentation

## 2020-03-07 DIAGNOSIS — Z801 Family history of malignant neoplasm of trachea, bronchus and lung: Secondary | ICD-10-CM | POA: Insufficient documentation

## 2020-03-07 DIAGNOSIS — C7931 Secondary malignant neoplasm of brain: Secondary | ICD-10-CM | POA: Diagnosis not present

## 2020-03-07 DIAGNOSIS — E1122 Type 2 diabetes mellitus with diabetic chronic kidney disease: Secondary | ICD-10-CM | POA: Diagnosis not present

## 2020-03-07 LAB — CBC WITH DIFFERENTIAL/PLATELET
Abs Immature Granulocytes: 0.03 10*3/uL (ref 0.00–0.07)
Basophils Absolute: 0 10*3/uL (ref 0.0–0.1)
Basophils Relative: 0 %
Eosinophils Absolute: 0.2 10*3/uL (ref 0.0–0.5)
Eosinophils Relative: 2 %
HCT: 42.8 % (ref 39.0–52.0)
Hemoglobin: 13.5 g/dL (ref 13.0–17.0)
Immature Granulocytes: 0 %
Lymphocytes Relative: 5 %
Lymphs Abs: 0.4 10*3/uL — ABNORMAL LOW (ref 0.7–4.0)
MCH: 31.8 pg (ref 26.0–34.0)
MCHC: 31.5 g/dL (ref 30.0–36.0)
MCV: 100.7 fL — ABNORMAL HIGH (ref 80.0–100.0)
Monocytes Absolute: 0.7 10*3/uL (ref 0.1–1.0)
Monocytes Relative: 9 %
Neutro Abs: 6.3 10*3/uL (ref 1.7–7.7)
Neutrophils Relative %: 84 %
Platelets: 149 10*3/uL — ABNORMAL LOW (ref 150–400)
RBC: 4.25 MIL/uL (ref 4.22–5.81)
RDW: 14.6 % (ref 11.5–15.5)
WBC: 7.6 10*3/uL (ref 4.0–10.5)
nRBC: 0 % (ref 0.0–0.2)

## 2020-03-07 LAB — COMPREHENSIVE METABOLIC PANEL
ALT: 29 U/L (ref 0–44)
AST: 22 U/L (ref 15–41)
Albumin: 3.6 g/dL (ref 3.5–5.0)
Alkaline Phosphatase: 58 U/L (ref 38–126)
Anion gap: 8 (ref 5–15)
BUN: 17 mg/dL (ref 8–23)
CO2: 29 mmol/L (ref 22–32)
Calcium: 9 mg/dL (ref 8.9–10.3)
Chloride: 97 mmol/L — ABNORMAL LOW (ref 98–111)
Creatinine, Ser: 1 mg/dL (ref 0.61–1.24)
GFR, Estimated: >60 mL/min (ref >60)
Glucose, Bld: 208 mg/dL — ABNORMAL HIGH (ref 70–99)
Potassium: 4.5 mmol/L (ref 3.5–5.1)
Sodium: 134 mmol/L — ABNORMAL LOW (ref 135–145)
Total Bilirubin: 0.8 mg/dL (ref 0.3–1.2)
Total Protein: 6.5 g/dL (ref 6.5–8.1)

## 2020-03-07 LAB — TSH: TSH: 3.598 u[IU]/mL (ref 0.350–4.500)

## 2020-03-07 MED ORDER — SODIUM CHLORIDE 0.9 % IV SOLN: Freq: Once | INTRAVENOUS | Status: AC

## 2020-03-07 MED ORDER — SODIUM CHLORIDE 0.9% FLUSH
10.0000 mL | INTRAVENOUS | Status: DC | PRN
  Administered 2020-03-07: 10 mL

## 2020-03-07 MED ORDER — SODIUM CHLORIDE 0.9 % IV SOLN
1200.0000 mg | Freq: Once | INTRAVENOUS | Status: AC
  Administered 2020-03-07: 1200 mg via INTRAVENOUS
  Filled 2020-03-07: qty 20

## 2020-03-07 MED ORDER — HYDROCODONE-ACETAMINOPHEN 5-325 MG PO TABS
1.0000 | ORAL_TABLET | Freq: Every evening | ORAL | 0 refills | Status: AC | PRN
Start: 2020-03-07 — End: ?

## 2020-03-07 MED ORDER — HEPARIN SOD (PORK) LOCK FLUSH 100 UNIT/ML IV SOLN
500.0000 [IU] | Freq: Once | INTRAVENOUS | Status: AC | PRN
  Administered 2020-03-07: 500 [IU]

## 2020-03-07 NOTE — Patient Instructions (Signed)
[facility] Discharge Instructions for Patients Receiving Chemotherapy  Today you received the following chemotherapy agents   To help prevent nausea and vomiting after your treatment, we encourage you to take your nausea medication   If you develop nausea and vomiting that is not controlled by your nausea medication, call the clinic.   BELOW ARE SYMPTOMS THAT SHOULD BE REPORTED IMMEDIATELY:  *FEVER GREATER THAN 100.5 F  *CHILLS WITH OR WITHOUT FEVER  NAUSEA AND VOMITING THAT IS NOT CONTROLLED WITH YOUR NAUSEA MEDICATION  *UNUSUAL SHORTNESS OF BREATH  *UNUSUAL BRUISING OR BLEEDING  TENDERNESS IN MOUTH AND THROAT WITH OR WITHOUT PRESENCE OF ULCERS  *URINARY PROBLEMS  *BOWEL PROBLEMS  UNUSUAL RASH Items with * indicate a potential emergency and should be followed up as soon as possible.  Feel free to call the clinic should you have any questions or concerns. The clinic phone number is [phone].  Please show the CHEMO ALERT CARD at check-in to the Emergency Department and triage nurse.   

## 2020-03-07 NOTE — Progress Notes (Signed)
Patient presents today for treatment and follow up visit with Dr. Delton Coombes. Labs within parameters for treatment. MAR reviewed. Patient denies any changes since his last visit. Patient on 3L/min of 02 via [place].   Message received to proceed with treatment from [place] LPN/ Dr. Delton Coombes. Labs reviewed.   TSH drawn and sent to lab.   Treatment given today per MD orders. Tolerated infusion without adverse affects. Vital signs stable. No complaints at this time. Discharged from clinic via wheel chair in stable condition. Alert and oriented x 3. Heart rate 114 on discharge. Reported to Dr. Delton Coombes. Patient denies any chest pain. Verbal order to discharge patient home received from Dr. Delton Coombes. F/U with [facility] as scheduled.

## 2020-03-07 NOTE — Progress Notes (Signed)
[place], [place] [address]   CLINIC:  Medical Oncology/Hematology  PCP:  Chase Douglas, MD [name] [place] [address] [phone]   REASON FOR VISIT:  Follow-up for right small cell lung cancer  PRIOR THERAPY: Carboplatin, VP-16 and atezolizumab x 3 cycles from 05/06/2019 to 08/28/2019  NGS Results: Not done  CURRENT THERAPY: Atezolizumab every 3 weeks  BRIEF ONCOLOGIC HISTORY:  Oncology History  Small cell lung cancer ([place])  04/03/2016 Initial Diagnosis   Small cell lung cancer ([place])   04/09/2016 Imaging   MRI brain- Negative for metastatic disease to the brain. No acute abnormality.   04/09/2016 Procedure   Right IJ port catheter placement by IR   04/13/2016 PET scan   . The right apical lung mass has significantly enlarged and there is new right paratracheal adenopathy as well as a hypermetabolic new lymph node in the right inner infraclavicular fossa potentially with early impingement on adjacent neurovascular structures. 2. Other imaging findings of potential clinical significance: Chronic bilateral maxillary sinusitis. Left carotid atherosclerotic calcification. Aortoiliac atherosclerotic vascular disease. Coronary atherosclerosis. Paraseptal emphysema. Mild atelectasis in the lung bases. Scattered sigmoid colon diverticula. Probable right pars defect at L5.   05/09/2016 - 07/12/2016 Radiation Therapy   Radiotherapy to right Lung+MS 63 Gy/35 fractions at 1.8 GY/f using 6 x photons 3- D CRT technique Dates: 4/18-6/21/18       06/19/2016 Treatment Plan Change   Cisplatin dose reduced by 20% for cycle #4 due to severe thrombocytopenia following cycle #3 (13,000).   08/21/2016 PET scan   1. Marked reduction in size and metabolic activity of RIGHT upper lobe mass. 2. Resolution of mediastinal and RIGHT supraclavicular lymphadenopathy. 3. No evidence of disease progression.   05/06/2019 -  Chemotherapy     Patient is on Treatment Plan: LUNG SCLC  ATEZOLIZUMAB MAINTENANCE Q21D        CANCER STAGING: Cancer Staging Small cell lung cancer ([place]) Staging form: Lung, AJCC 8th Edition - Clinical: Stage IIIC (cT3, cN3, cM0) - Signed by Chase First, MD on 04/16/2016   INTERVAL HISTORY:  Chase Herrera, a 70 y.o. male, returns for routine follow-up and consideration for next cycle of immunotherapy. Chase Herrera was last seen by Chase Herrera on 01/05/2020.  Due for cycle #12 of atezolizumab today.   Overall, he tells me he has been feeling fair. He reports having mild CP and reports that it is controlled with Vicodin. He denies having any diarrhea. His cough and SOB due to COPD are stable with Lasix and spironolactone.  He is scheduled to have an MRI brain on 02/17.  Overall, he feels ready for next cycle of immunotherapy today.    REVIEW OF SYSTEMS:  Review of Systems  Constitutional: Positive for fatigue (depleted). Negative for appetite change.  Respiratory: Positive for cough (d/t COPD) and shortness of breath (d/t COPD).   Cardiovascular: Positive for chest pain and leg swelling.  Gastrointestinal: Negative for diarrhea.  Musculoskeletal: Positive for back pain (lower back pain).  Neurological: Positive for numbness (stable).  All other systems reviewed and are negative.   PAST MEDICAL/SURGICAL HISTORY:  Past Medical History:  Diagnosis Date  . Alcohol abuse    Quit 08/23/11  . Arthritis   . Atrial fibrillation ([place])   . Atrial flutter ([place])    CTI ablation by Dr Chase Herrera 10/2011  . Bacteremia due to Gram-positive bacteria 11/29/2016  . CHF (congestive heart failure) ([place])   .  COPD (chronic obstructive pulmonary disease) ([place])   . Depression   . Essential hypertension   . History of cardiomyopathy    LVEF 25-30% 08/2011 with subsequent normalization  . History of kidney stones   . Obesity   . Prostate enlargement   . Small cell lung cancer ([place])    Right upper lobe - follows  with Chase Herrera  . Type 2 diabetes mellitus ([place])    Past Surgical History:  Procedure Laterality Date  . APPENDECTOMY    . ATRIAL ABLATION SURGERY  11/06/2011   CTI ablation for atrial flutter by Dr Chase Herrera  . ATRIAL FLUTTER ABLATION N/A 11/06/2011   Procedure: ATRIAL FLUTTER ABLATION;  Surgeon: Chase Grayer, MD;  Location: [facility];  Service: Cardiovascular;  Laterality: N/A;  . Cataracts    . COLONOSCOPY WITH PROPOFOL N/A 06/06/2017   Procedure: COLONOSCOPY WITH PROPOFOL;  Surgeon: Chase Dolin, MD;  Location: [facility] ENDO SUITE;  Service: Endoscopy;  Laterality: N/A;  1:15pm  . IR GENERIC HISTORICAL  04/09/2016   IR US GUIDE VASC ACCESS RIGHT 04/09/2016 Chase Mckusick, DO [facility]-INTERV RAD  . IR GENERIC HISTORICAL  04/09/2016   IR FLUORO GUIDE PORT INSERTION RIGHT 04/09/2016 Chase Mckusick, DO [facility]  . PORT-A-CATH REMOVAL N/A 04/24/2017   Procedure: MINOR REMOVAL PORT-A-CATH;  Surgeon: Chase Cagey, MD;  Location: [facility] ORS;  Service: General;  Laterality: N/A;  . PORTACATH PLACEMENT Left 04/22/2019   Procedure: INSERTION PORT-A-CATH;  Surgeon: Chase Signs, MD;  Location: [facility] ORS;  Service: General;  Laterality: Left;  . TONSILLECTOMY      SOCIAL HISTORY:  Social History   Socioeconomic History  . Marital status: Single    Spouse name: Not on file  . Number of children: Not on file  . Years of education: Not on file  . Highest education level: Not on file  Occupational History  . Occupation: retired     Comment: Building services engineer  Tobacco Use  . Smoking status: Former Smoker    Packs/day: 0.50    Years: 47.00    Pack years: 23.50    Types: Cigarettes    Quit date: 05/30/2012    Years since quitting: 7.7  . Smokeless tobacco: Former Systems developer    Quit date: 04/10/2014  . Tobacco comment: encouraged to quit today 03/04/12  Vaping Use  . Vaping Use: Never used  Substance and Sexual Activity  . Alcohol use: No    Comment: quit 3 1/2 years ago  . Drug use: Not Currently     Comment: marajuana occasionally  . Sexual activity: Not Currently  Other Topics Concern  . Not on file  Social History Narrative   Lives in [place] alone.   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 Difficulty of Paying Living Expenses: Somewhat hard  Food Insecurity: No Food Insecurity  . Worried About Charity fundraiser in the Last Year: Never true  . Ran Out of Food in the Last Year: Never true  Transportation Needs: No Transportation Needs  . Lack of Transportation (Medical): No  . Lack of Transportation (Non-Medical): No  Physical Activity: Inactive  . Days of Exercise per Week: 0 days  . Minutes of Exercise per Session: 0 min  Stress: No Stress Concern Present  . Feeling of Stress : Not at all  Social Connections: Moderately Isolated  . Frequency  of Communication with Friends and Family: More than three times a week  . Frequency of Social Gatherings with Friends and Family: Never  . Attends Religious Services: More than 4 times per year  . Active Member of Clubs or Organizations: No  . Attends Archivist Meetings: Never  . Marital Status: Never married  Intimate Partner Violence: Not At Risk  . Fear of Current or Ex-Partner: No  . Emotionally Abused: No  . Physically Abused: No  . Sexually Abused: No    FAMILY HISTORY:  Family History  Problem Relation Age of Onset  . Diabetes Sister   . Hypertension Mother   . Lung cancer Mother   . Hypertension Father   . Heart attack Father   . Hypertension Sister   . Hypertension Sister   . Hypertension Sister   . Pulmonary fibrosis Sister   . Breast cancer Cousin   . Colon cancer Neg Hx     CURRENT MEDICATIONS:  Current Outpatient Medications  Medication Sig Dispense Refill  . acetaminophen (TYLENOL) 500 MG tablet Take 1,000 mg by mouth every 6 (six) hours as needed for moderate pain or headache.      Marland Kitchen apixaban (ELIQUIS) 5 MG TABS tablet Take 5 mg by mouth 2 (two) times daily.     Marland Kitchen AQUALANCE LANCETS 30G MISC USE TO check blood glucose twice daily  99  . Artificial Saliva (BIOTENE DRY MOUTH MOISTURIZING) SOLN Apply 1 Dose topically as needed (dry mouth).     Marland Kitchen atorvastatin (LIPITOR) 20 MG tablet Take 1 tablet (20 mg total) by mouth daily. 90 tablet 3  . benzonatate (TESSALON) 100 MG capsule Take 2 capsules (200 mg total) by mouth every 8 (eight) hours as needed. 30 capsule 1  . Carboxymethylcellul-Glycerin (LUBRICATING EYE DROPS OP) Place 1 drop into the left eye daily as needed (dry eyes).     Marland Kitchen Co-Enzyme Q10 200 MG CAPS See admin instructions.    Marland Kitchen diltiazem (DILACOR XR) 240 MG 24 hr capsule Take 240 mg by mouth daily.    Marland Kitchen dofetilide (TIKOSYN) 250 MCG capsule Take 1 capsule (250 mcg total) by mouth 2 (two) times daily.    . fluticasone (FLONASE) 50 MCG/ACT nasal spray Place 2 sprays into both nostrils daily. 16 g 6  . Fluticasone-Umeclidin-Vilant (TRELEGY ELLIPTA) 100-62.5-25 MCG/INH AEPB Inhale 1 puff into the lungs daily. 60 each 6  . furosemide (LASIX) 80 MG tablet Take 80 mg by mouth daily.    Marland Kitchen gabapentin (NEURONTIN) 300 MG capsule Take 600 mg by mouth 2 (two) times daily.     Marland Kitchen GLOBAL EASE INJECT PEN NEEDLES 31G X 8 MM MISC Inject into the skin 3 (three) times daily.    Marland Kitchen guaifenesin (ROBITUSSIN) 100 MG/5ML syrup Take 200 mg by mouth at bedtime as needed for cough.     Marland Kitchen HM SENNA 8.6 MG tablet Take 2 tablets by mouth at bedtime.    Marland Kitchen HYDROcodone-acetaminophen (NORCO/VICODIN) 5-325 MG tablet Take 1 tablet by mouth at bedtime as needed. 30 tablet 0  . insulin lispro (HUMALOG) 100 UNIT/ML injection Inject 2-20 Units into the skin 3 (three) times daily with meals. Sliding scale    . Insulin Syringe-Needle U-100 28G X 1/2" 1 ML MISC 1 each by Does not apply route 3 (three) times daily. 100 each 3  . ipratropium-albuterol (DUONEB) 0.5-2.5 (3) MG/3ML SOLN Take 3 mLs by nebulization every 4  (four) hours as needed (shortness of breath).     Elmore Guise  Devices (ADJUSTABLE LANCING DEVICE) MISC TO check blood glucose daily    . LANTUS SOLOSTAR 100 UNIT/ML Solostar Pen INJECT 20 UNITS UNDER THE SKIN EVERY MORNING AND INJECT 20 UNITS UNDER THE SKIN EVERY EVENING (Patient taking differently: Inject 20 Units into the skin 2 (two) times daily.) 45 mL 0  . levalbuterol (XOPENEX) 0.63 MG/3ML nebulizer solution Inhale 3 mLs (0.63 mg total) into the lungs 4 (four) times daily. 3 mL 0  . Lidocaine (SALONPAS PAIN RELIEVING EX) Apply 1 patch topically daily.     Marland Kitchen lidocaine-prilocaine (EMLA) cream Apply a small amount to port a cath site and cover with plastic wrap 1 hour prior to chemotherapy appointments 30 g 3  . metFORMIN (GLUCOPHAGE) 500 MG tablet Take 500 mg by mouth in the morning and at bedtime.    . metoprolol succinate (TOPROL XL) 25 MG 24 hr tablet Take 1 tablet (25 mg total) by mouth daily. 90 tablet 1  . Misc. Devices MISC Please provide patient with an electric wheelchair. 1 each 0  . Multiple Vitamin (MULTIVITAMIN WITH MINERALS) TABS tablet Take 1 tablet by mouth daily.    . potassium chloride (KLOR-CON) 10 MEQ tablet Take 10 mEq by mouth 2 (two) times daily.    . pregabalin (LYRICA) 150 MG capsule Take 150 mg by mouth in the morning and at bedtime.    Marland Kitchen PROAIR HFA 108 (90 Base) MCG/ACT inhaler INHALE TWO PUFFS BY MOUTH EVERY 4 HOURS AS NEEDED FOR WHEEZING 17 g 0  . spironolactone (ALDACTONE) 25 MG tablet Take 12.5 mg by mouth 2 (two) times daily.     . tamsulosin (FLOMAX) 0.4 MG CAPS capsule Take 1 capsule (0.4 mg total) by mouth 2 (two) times daily after a meal. 180 capsule 1  . traZODone (DESYREL) 50 MG tablet Take 50 mg by mouth at bedtime.  5   No current facility-administered medications for this visit.   Facility-Administered Medications Ordered in Other Visits  Medication Dose Route Frequency Provider Last Rate Last Admin  . heparin lock flush 100 unit/mL  500 Units  Intravenous Once Kyung Rudd, MD      . sodium chloride flush (NS) 0.9 % injection 10 mL  10 mL Intravenous PRN Kyung Rudd, MD        ALLERGIES:  No Known Allergies  PHYSICAL EXAM:  Performance status (ECOG): 1 - Symptomatic but completely ambulatory  Vitals:   03/07/20 0927  BP: 120/60  Pulse: 90  Resp: 20  Temp: (!) 96.8 F (36 C)  SpO2: 93%   Wt Readings from Last 3 Encounters:  03/07/20 250 lb (113.4 kg)  02/19/20 240 lb (108.9 kg)  01/07/20 244 lb (110.7 kg)   Physical Exam Vitals reviewed.  Constitutional:      Appearance: Normal appearance. He is obese.     Interventions: Nasal cannula in place.  Cardiovascular:     Rate and Rhythm: Normal rate. Rhythm irregular.     Pulses: Normal pulses.     Heart sounds: Normal heart sounds.  Pulmonary:     Effort: Pulmonary effort is normal.     Breath sounds: Normal breath sounds.  Chest:     Comments: Port-a-Cath in L chest Musculoskeletal:     Right lower leg: Edema (1+) present.     Left lower leg: Edema (1+) present.  Neurological:     General: No focal deficit present.     Mental Status: He is alert and oriented to person, place, and time.  Psychiatric:  Mood and Affect: Mood normal.        Behavior: Behavior normal.     LABORATORY DATA:  I have reviewed the labs as listed.  CBC Latest Ref Rng & Units 03/07/2020 01/05/2020 12/15/2019  WBC 4.0 - 10.5 K/uL 7.6 6.7 7.1  Hemoglobin 13.0 - 17.0 g/dL 13.5 12.3(L) 13.5  Hematocrit 39.0 - 52.0 % 42.8 38.6(L) 43.9  Platelets 150 - 400 K/uL 149(L) 141(L) 120(L)   CMP Latest Ref Rng & Units 03/07/2020 02/05/2020 01/05/2020  Glucose 70 - 99 mg/dL 208(H) - 382(H)  BUN 8 - 23 mg/dL 17 - 24(H)  Creatinine 0.61 - 1.24 mg/dL 1.00 0.90 1.21  Sodium 135 - 145 mmol/L 134(L) - 136  Potassium 3.5 - 5.1 mmol/L 4.5 - 3.5  Chloride 98 - 111 mmol/L 97(L) - 99  CO2 22 - 32 mmol/L 29 - 29  Calcium 8.9 - 10.3 mg/dL 9.0 - 8.7(L)  Total Protein 6.5 - 8.1 g/dL 6.5 - 5.9(L)   Total Bilirubin 0.3 - 1.2 mg/dL 0.8 - 0.4  Alkaline Phos 38 - 126 U/L 58 - 49  AST 15 - 41 U/L 22 - 20  ALT 0 - 44 U/L 29 - 30    DIAGNOSTIC IMAGING:  I have independently reviewed the scans and discussed with the patient. ECHOCARDIOGRAM COMPLETE  Result Date: 02/12/2020    ECHOCARDIOGRAM REPORT   Patient Name:   JVION TURGEON Date of Exam: 02/12/2020 Medical Rec #:  [id number]     Height:       73.0 in Accession #:    [id number]    Weight:       244.0 lb Date of Birth:  1950/02/09     BSA:          2.341 m Patient Age:    28 years      BP:           122/68 mmHg Patient Gender: M             HR:           86 bpm. Exam Location:  [facility] Procedure: 2D Echo Indications:    CHF (congestive heart failure) ([place]) [[id number]]  History:        Patient has prior history of Echocardiogram examinations, most                 recent 11/15/2016. CHF, COPD, Arrythmias:Atrial Flutter and                 Atrial Fibrillation; Risk Factors:Hypertension, Diabetes and                 Former Smoker. NICM, Brain metastases , Small cell lung cancer.  Sonographer:    Leavy Cella RDCS (AE) Referring Phys: [place]. IMPRESSIONS  1. Left ventricular ejection fraction, by estimation, is 55 to 60%. The left ventricle has normal function. The left ventricle has no regional wall motion abnormalities. There is mild left ventricular hypertrophy. Left ventricular diastolic parameters are indeterminate.  2. Right ventricular systolic function is normal. The right ventricular size is normal. Tricuspid regurgitation signal is inadequate for assessing PA pressure.  3. The mitral valve is grossly normal. No evidence of mitral valve regurgitation.  4. The aortic valve is tricuspid. Aortic valve regurgitation is not visualized.  5. The inferior vena cava is normal in size with greater than 50% respiratory variability, suggesting right atrial pressure of 3 mmHg. FINDINGS  Left Ventricle: Left ventricular ejection  fraction, by  estimation, is 55 to 60%. The left ventricle has normal function. The left ventricle has no regional wall motion abnormalities. The left ventricular internal cavity size was normal in size. There is  mild left ventricular hypertrophy. Left ventricular diastolic parameters are indeterminate. Right Ventricle: The right ventricular size is normal. No increase in right ventricular wall thickness. Right ventricular systolic function is normal. Tricuspid regurgitation signal is inadequate for assessing PA pressure. Left Atrium: Left atrial size was normal in size. Right Atrium: Right atrial size was normal in size. Pericardium: There is no evidence of pericardial effusion. Mitral Valve: The mitral valve is grossly normal. No evidence of mitral valve regurgitation. Tricuspid Valve: The tricuspid valve is grossly normal. Tricuspid valve regurgitation is trivial. Aortic Valve: The aortic valve is tricuspid. There is mild aortic valve annular calcification. Aortic valve regurgitation is not visualized. Pulmonic Valve: The pulmonic valve was grossly normal. Pulmonic valve regurgitation is trivial. Aorta: The aortic root is normal in size and structure. Venous: The inferior vena cava is normal in size with greater than 50% respiratory variability, suggesting right atrial pressure of 3 mmHg. IAS/Shunts: No atrial level shunt detected by color flow Doppler.  LEFT VENTRICLE PLAX 2D LVIDd:         5.50 cm  Diastology LVIDs:         4.20 cm  LV e' medial:   9.68 cm/s LV PW:         1.20 cm  LV E/e' medial: 7.6 LV IVS:        1.20 cm LVOT diam:     2.10 cm LVOT Area:     3.46 cm  RIGHT VENTRICLE RV S prime:     18.00 cm/s TAPSE (M-mode): 2.6 cm LEFT ATRIUM             Index       RIGHT ATRIUM          Index LA diam:        3.90 cm 1.67 cm/m  RA Area:     9.41 cm LA Vol (A2C):   31.5 ml 13.45 ml/m RA Volume:   19.10 ml 8.16 ml/m LA Vol (A4C):   40.8 ml 17.43 ml/m LA Biplane Vol: 39.9 ml 17.04 ml/m   AORTA Ao Root diam: 3.50 cm  MITRAL VALVE MV Area (PHT): 3.11 cm    SHUNTS MV Decel Time: 244 msec    Systemic Diam: 2.10 cm MV E velocity: 73.70 cm/s MV A velocity: 71.30 cm/s MV E/A ratio:  1.03 [name] MD Electronically signed by [name] MD Signature Date/Time: 02/12/2020/5:26:27 PM    Final      ASSESSMENT:  1. Recurrent small cell lung cancer: -Treated for limited stage small cell lung cancer with chemoradiation with cisplatin and etoposide therapy on 06/21/2016 followed by PCI. -CT scan on 04/17/2019 showed worsening right lung nodules. Left lower lobe infectious cavitary lesion was decreased in size. New right middle lobe lung nodule present. -6 cycles of carboplatin, VP-16 and atezolizumab from 05/06/2019 through 08/26/2019. -CT CAP on 07/07/2019 showed improvement in the right lung cancer. Interval development of consolidative change in the left upper lobe consistent with infection/inflammation. -CT CAP on 10/21/2019 showed stable posttreatment changes in the right lung apex and suprahilar medial right upper lobe.  Nodular opacity in the left apex has decreased in size.  5 mm posterior left lower lobe nodule is new, likely infectious/inflammatory.  Left lower lobe nodules are stable. -CT CAP on 02/05/2020  shows enlarging right paracolic lymph node adjacent to the ascending colon measuring 1.5 cm, previously 0.5 cm, reactive versus neoplastic.  Stable right apical and paramediastinal/perihilar consolidation.  2. Brain metastasis: -MRI of the brain on 05/13/2019 showed small 7 mm ring enhancing lesion at the junction of the left posterior temporal and anterior occipital lobes compatible with solitary metastasis. Typical appearance of lacunar infarct is also considered. -MRI of the brain on 07/31/2019 shows stable 7 mm solitary metastasis in the left occipital temporal junction. -MRI of the brain on 10/28/2019 showed mild increase in size of rim-enhancing lesion measuring 10 x 10 mm.  No new lesions  identified.   PLAN:  1. Recurrent small cell lung cancer: -Last treatment was on 01/05/2020.  He missed her last 2 appointments.  One time he reportedly had to go to the ER for breathing issue. -Today he is feeling good for treatment.  LFTs are normal.  CBC shows platelet count 149.  TSH was 3.5. -I have reviewed results of CT CAP from 02/05/2020 which showed stable disease. -He will proceed with his treatment today.  I will see him back in 3 weeks for follow-up.  2. CKD: -Creatinine is 1 and stable.  3. Diabetes: -Continue Lantus twice daily and NovoLog sliding scale.  4. Atrial fibrillation: -Continue Eliquis 5 mg twice daily.  No bleeding issues.  5. Low back pain: -Continue hydrocodone 5/325 mg at bedtime as needed.  6. Brain metastasis: -He had SRS.  He was told to have MRI of the brain done on 03/10/2020.  7. Lower extremity swelling: -Continue Lasix and spironolactone.   Orders placed this encounter:  Orders Placed This Encounter  Procedures  . CBC with Differential/Platelet  . Comprehensive metabolic panel  . Magnesium  . TSH     Derek Jack, MD [place] [phone]   I, Milinda Antis, am acting as a scribe for Dr. Sanda Linger.  I, Derek Jack MD, have reviewed the above documentation for accuracy and completeness, and I agree with the above.

## 2020-03-07 NOTE — Progress Notes (Signed)
Patient was assessed by Dr. Katragadda and labs have been reviewed.  Patient is okay to proceed with treatment today. Primary RN and pharmacy aware.   

## 2020-03-07 NOTE — Patient Instructions (Signed)
[facility] at [facility] Discharge Instructions  You were seen today by Dr. Delton Coombes. He went over your recent results. You received your treatment today. Keep your appointment to have your MRI of your brain on 02/14 at 1:20 PM. Dr. Delton Coombes will see you back in 3 weeks for labs and follow up.   Thank you for choosing [place] at [facility] to provide your oncology and hematology care.  To afford each patient quality time with our provider, please arrive at least 15 minutes before your scheduled appointment time.   If you have a lab appointment with the [facility] please come in thru the Main Entrance and check in at the main information desk  You need to re-schedule your appointment should you arrive 10 or more minutes late.  We strive to give you quality time with our providers, and arriving late affects you and other patients whose appointments are after yours.  Also, if you no show three or more times for appointments you may be dismissed from the clinic at the providers discretion.     Again, thank you for choosing [facility].  Our hope is that these requests will decrease the amount of time that you wait before being seen by our physicians.       _____________________________________________________________  Should you have questions after your visit to [facility], please contact our office at [phone] between the hours of 8:00 a.m. and 4:30 p.m.  Voicemails left after 4:00 p.m. will not be returned until the following business day.  For prescription refill requests, have your pharmacy contact our office and allow 72 hours.    Cancer Center Support Programs:   > Cancer Support Group  2nd Tuesday of the month 1pm-2pm, Journey Room

## 2020-03-10 ENCOUNTER — Other Ambulatory Visit: Payer: Self-pay

## 2020-03-10 ENCOUNTER — Ambulatory Visit
Admission: RE | Admit: 2020-03-10 | Discharge: 2020-03-10 | Disposition: A | Discharge status: Home / Self Care | Payer: Medicare Other | Source: Ambulatory Visit | Attending: Radiation Oncology | Admitting: Radiation Oncology

## 2020-03-10 ENCOUNTER — Inpatient Hospital Stay
Admission: RE | Admit: 2020-03-10 | Discharge: 2020-03-10 | Disposition: A | Discharge status: Home / Self Care | Payer: Medicare Other | Source: Ambulatory Visit | Attending: Radiation Oncology | Admitting: Radiation Oncology

## 2020-03-10 DIAGNOSIS — C7931 Secondary malignant neoplasm of brain: Secondary | ICD-10-CM

## 2020-03-10 DIAGNOSIS — C7949 Secondary malignant neoplasm of other parts of nervous system: Secondary | ICD-10-CM

## 2020-03-10 MED ORDER — GADOBENATE DIMEGLUMINE 529 MG/ML IV SOLN
20.0000 mL | Freq: Once | INTRAVENOUS | Status: AC | PRN
  Administered 2020-03-10: 20 mL via INTRAVENOUS

## 2020-03-14 ENCOUNTER — Telehealth: Payer: Self-pay

## 2020-03-14 ENCOUNTER — Other Ambulatory Visit: Payer: Self-pay

## 2020-03-14 ENCOUNTER — Inpatient Hospital Stay: Payer: Medicare Other | Attending: Radiation Oncology

## 2020-03-14 ENCOUNTER — Encounter: Payer: Self-pay | Admitting: Radiation Oncology

## 2020-03-14 ENCOUNTER — Ambulatory Visit
Admission: RE | Admit: 2020-03-14 | Discharge: 2020-03-14 | Disposition: A | Discharge status: Home / Self Care | Payer: Medicare Other | Source: Ambulatory Visit | Attending: Radiation Oncology | Admitting: Radiation Oncology

## 2020-03-14 DIAGNOSIS — C3490 Malignant neoplasm of unspecified part of unspecified bronchus or lung: Secondary | ICD-10-CM

## 2020-03-14 DIAGNOSIS — C3491 Malignant neoplasm of unspecified part of right bronchus or lung: Secondary | ICD-10-CM

## 2020-03-14 NOTE — Progress Notes (Signed)
Radiation Oncology         [phone] ________________________________  Outpatient Follow Up - Conducted via telephone due to current COVID-19 concerns for limiting patient exposure  I spoke with the patient to conduct this consult visit via telephone to spare the patient unnecessary potential exposure in the healthcare setting during the current COVID-19 pandemic. The patient was notified in advance and was offered a [place] meeting to allow for face to face communication but unfortunately reported that they did not have the appropriate resources/technology to support such a visit and instead preferred to proceed with a telephone visit.    Name: Chase Herrera        MRN: [id number]  Date of Service: 03/14/2020 DOB: 1950-08-03  QM:VHQIONGE, Nathaneil Canary, MD  [facility] [place] [facility]     REFERRING PHYSICIAN: [facility] [place] [facility]   DIAGNOSIS: The primary encounter diagnosis was Small cell lung cancer ([place]). A diagnosis of Small cell carcinoma of lung, right (HCC) was also pertinent to this visit.   HISTORY OF PRESENT ILLNESS: Chase Herrera is a 70 y.o. male with a history of progressive metastatic limited stage small cell carcinoma of the right lung. The patient was originally diagnosed with his cancer in the summer of 2018 and received chemoRT with medical oncology at [facility] and radiotherapy at [facility]. He received prophylactic cranial irradiation between September and October 2018 as well.  He was found to have disease progression in the chest as well as a single lesion in the brain measuring approximately 6 to 7 mm along the lateral aspect of the left occipital lobe.  He began carboplatin etoposide with atezolizumab in April 2021.  He was found to have stable disease with this regimen, and repeat MRI of the brain on 07/31/2019 revealed stability and a 7 mm ring-enhancing mass at the cortex of the left occipital temporal junction.  No new disease was identified in August 2021, he  switched to consolidative therapy with atezolizumab which he continues.  His MRI on 10/28/2019 to follow-up on the lesion revealed progression measuring 10 x 10 mm with mild surrounding vasogenic edema that had not previously been seen.  No new lesions were otherwise noted.  He completed SRS to the two lesions in November 2021 and his first post treatment MRI on 03/10/20 revealed a decrease in the size of the left temporal lobe lesion measuring 4 mm, and the other previous seen right caudate lesion is no longer visible. He's contacted today to discuss these results. He continues with maintenance Atezolizumab.  PREVIOUS RADIATION THERAPY: Yes   12/04/19 SRS Treatment: Both lesions were treated to 20 Gy in 1 fraction PTV1 LtOccTemp PTV2 RtCaudate  September-October 2018 The patient received PCI to the whole brain with 25 Gy in 10 fraction at [facility] with Dr. Leotis Shames  05/09/16-07/12/16:  The patient received 63 Gy in 35 fractions to the right lung at [facility] with Dr. Leotis Shames   PAST MEDICAL HISTORY:  Past Medical History:  Diagnosis Date  . Alcohol abuse    Quit 08/23/11  . Arthritis   . Atrial fibrillation ([place])   . Atrial flutter ([place])    CTI ablation by Dr [name] 10/2011  . Bacteremia due to Gram-positive bacteria 11/29/2016  . CHF (congestive heart failure) ([place])   . COPD (chronic obstructive pulmonary disease) ([place])   . Depression   . Essential hypertension   . History of cardiomyopathy    LVEF 25-30% 08/2011 with subsequent normalization  . History of kidney  stones   . Obesity   . Prostate enlargement   . Small cell lung cancer ([place])    Right upper lobe - follows with [facility]  . Type 2 diabetes mellitus ([place])        PAST SURGICAL HISTORY: Past Surgical History:  Procedure Laterality Date  . APPENDECTOMY    . ATRIAL ABLATION SURGERY  11/06/2011   CTI ablation for atrial flutter by Dr [name]  . ATRIAL FLUTTER ABLATION N/A 11/06/2011   Procedure:  ATRIAL FLUTTER ABLATION;  Surgeon: Thompson Grayer, MD;  Location: [facility] CATH LAB;  Service: Cardiovascular;  Laterality: N/A;  . Cataracts    . COLONOSCOPY WITH PROPOFOL N/A 06/06/2017   Procedure: COLONOSCOPY WITH PROPOFOL;  Surgeon: [name], MD;  Location: [facility] ENDO SUITE;  Service: Endoscopy;  Laterality: N/A;  1:15pm  . IR GENERIC HISTORICAL  04/09/2016   IR US GUIDE VASC ACCESS RIGHT 04/09/2016 [name], DO [facility]-INTERV RAD  . IR GENERIC HISTORICAL  04/09/2016   IR FLUORO GUIDE PORT INSERTION RIGHT 04/09/2016 [name], DO [facility]-INTERV RAD  . PORT-A-CATH REMOVAL N/A 04/24/2017   Procedure: MINOR REMOVAL PORT-A-CATH;  Surgeon: [name], MD;  Location: [facility] ORS;  Service: General;  Laterality: N/A;  . PORTACATH PLACEMENT Left 04/22/2019   Procedure: INSERTION PORT-A-CATH;  Surgeon: Aviva Signs, MD;  Location: [facility] ORS;  Service: General;  Laterality: Left;  . TONSILLECTOMY       FAMILY HISTORY:  Family History  Problem Relation Age of Onset  . Diabetes Sister   . Hypertension Mother   . Lung cancer Mother   . Hypertension Father   . Heart attack Father   . Hypertension Sister   . Hypertension Sister   . Hypertension Sister   . Pulmonary fibrosis Sister   . Breast cancer Cousin   . Colon cancer Neg Hx      SOCIAL HISTORY:  reports that he quit smoking about 7 years ago. His smoking use included cigarettes. He has a 23.50 pack-year smoking history. He quit smokeless tobacco use about 5 years ago. He reports previous drug use. He reports that he does not drink alcohol. The patient is single and lives in [place], [place].    ALLERGIES: Patient has no known allergies.   MEDICATIONS:  Current Outpatient Medications  Medication Sig Dispense Refill  . acetaminophen (TYLENOL) 500 MG tablet Take 1,000 mg by mouth every 6 (six) hours as needed for moderate pain or headache.     Marland Kitchen apixaban (ELIQUIS) 5 MG TABS tablet Take 5 mg by mouth 2 (two) times daily.     Marland Kitchen AQUALANCE LANCETS 30G MISC  USE TO check blood glucose twice daily  99  . Artificial Saliva (BIOTENE DRY MOUTH MOISTURIZING) SOLN Apply 1 Dose topically as needed (dry mouth).     Marland Kitchen atorvastatin (LIPITOR) 20 MG tablet Take 1 tablet (20 mg total) by mouth daily. 90 tablet 3  . benzonatate (TESSALON) 100 MG capsule Take 2 capsules (200 mg total) by mouth every 8 (eight) hours as needed. 30 capsule 1  . Carboxymethylcellul-Glycerin (LUBRICATING EYE DROPS OP) Place 1 drop into the left eye daily as needed (dry eyes).     Marland Kitchen Co-Enzyme Q10 200 MG CAPS See admin instructions.    Marland Kitchen diltiazem (DILACOR XR) 240 MG 24 hr capsule Take 240 mg by mouth daily.    Marland Kitchen dofetilide (TIKOSYN) 250 MCG capsule Take 1 capsule (250 mcg total) by mouth 2 (two) times daily.    Marland Kitchen  fluticasone (FLONASE) 50 MCG/ACT nasal spray Place 2 sprays into both nostrils daily. 16 g 6  . Fluticasone-Umeclidin-Vilant (TRELEGY ELLIPTA) 100-62.5-25 MCG/INH AEPB Inhale 1 puff into the lungs daily. 60 each 6  . furosemide (LASIX) 80 MG tablet Take 80 mg by mouth daily.    Marland Kitchen gabapentin (NEURONTIN) 300 MG capsule Take 600 mg by mouth 2 (two) times daily.     Marland Kitchen GLOBAL EASE INJECT PEN NEEDLES 31G X 8 MM MISC Inject into the skin 3 (three) times daily.    Marland Kitchen guaifenesin (ROBITUSSIN) 100 MG/5ML syrup Take 200 mg by mouth at bedtime as needed for cough.     Marland Kitchen HM SENNA 8.6 MG tablet Take 2 tablets by mouth at bedtime.    Marland Kitchen HYDROcodone-acetaminophen (NORCO/VICODIN) 5-325 MG tablet Take 1 tablet by mouth at bedtime as needed. 30 tablet 0  . insulin lispro (HUMALOG) 100 UNIT/ML injection Inject 2-20 Units into the skin 3 (three) times daily with meals. Sliding scale    . Insulin Syringe-Needle U-100 28G X 1/2" 1 ML MISC 1 each by Does not apply route 3 (three) times daily. 100 each 3  . ipratropium-albuterol (DUONEB) 0.5-2.5 (3) MG/3ML SOLN Take 3 mLs by nebulization every 4 (four) hours as needed (shortness of breath).     Elmore Guise Devices (ADJUSTABLE LANCING DEVICE) MISC TO check  blood glucose daily    . LANTUS SOLOSTAR 100 UNIT/ML Solostar Pen INJECT 20 UNITS UNDER THE SKIN EVERY MORNING AND INJECT 20 UNITS UNDER THE SKIN EVERY EVENING (Patient taking differently: Inject 20 Units into the skin 2 (two) times daily.) 45 mL 0  . levalbuterol (XOPENEX) 0.63 MG/3ML nebulizer solution Inhale 3 mLs (0.63 mg total) into the lungs 4 (four) times daily. 3 mL 0  . Lidocaine (SALONPAS PAIN RELIEVING EX) Apply 1 patch topically daily.     Marland Kitchen lidocaine-prilocaine (EMLA) cream Apply a small amount to port a cath site and cover with plastic wrap 1 hour prior to chemotherapy appointments 30 g 3  . metFORMIN (GLUCOPHAGE) 500 MG tablet Take 500 mg by mouth in the morning and at bedtime.    . metoprolol succinate (TOPROL XL) 25 MG 24 hr tablet Take 1 tablet (25 mg total) by mouth daily. (Patient taking differently: Take 25 mg by mouth in the morning and at bedtime.) 90 tablet 1  . metoprolol tartrate (LOPRESSOR) 25 MG tablet 1 tablet with food    . Misc. Devices MISC Please provide patient with an electric wheelchair. 1 each 0  . Multiple Vitamin (MULTIVITAMIN WITH MINERALS) TABS tablet Take 1 tablet by mouth daily.    . potassium chloride (KLOR-CON) 10 MEQ tablet Take 10 mEq by mouth 2 (two) times daily.    . pregabalin (LYRICA) 150 MG capsule Take 150 mg by mouth in the morning and at bedtime.    Marland Kitchen PROAIR HFA 108 (90 Base) MCG/ACT inhaler INHALE TWO PUFFS BY MOUTH EVERY 4 HOURS AS NEEDED FOR WHEEZING 17 g 0  . spironolactone (ALDACTONE) 25 MG tablet Take 12.5 mg by mouth 2 (two) times daily.     . tamsulosin (FLOMAX) 0.4 MG CAPS capsule Take 1 capsule (0.4 mg total) by mouth 2 (two) times daily after a meal. 180 capsule 1  . traZODone (DESYREL) 50 MG tablet Take 50 mg by mouth at bedtime.  5   No current facility-administered medications for this encounter.   Facility-Administered Medications Ordered in Other Encounters  Medication Dose Route Frequency Provider Last Rate Last Admin  .  heparin lock flush 100 unit/mL  500 Units Intravenous Once Kyung Rudd, MD      . sodium chloride flush (NS) 0.9 % injection 10 mL  10 mL Intravenous PRN Kyung Rudd, MD         REVIEW OF SYSTEMS: On review of systems, the patient reports that he is doing well overall. He denies any headaches, visual, auditory, speech, or movement changes. He denies any seizure activity. He feels otherwise that he is doing pretty well with his immunotherapy. No other complaints are verbalized.  PHYSICAL EXAM:  Unable to assess due to encounter type.   ECOG = 0  0 - Asymptomatic (Fully active, able to carry on all predisease activities without restriction)  1 - Symptomatic but completely ambulatory (Restricted in physically strenuous activity but ambulatory and able to carry out work of a light or sedentary nature. For example, light housework, office work)  2 - Symptomatic, <50% in bed during the day (Ambulatory and capable of all self care but unable to carry out any work activities. Up and about more than 50% of waking hours)  3 - Symptomatic, >50% in bed, but not bedbound (Capable of only limited self-care, confined to bed or chair 50% or more of waking hours)  4 - Bedbound (Completely disabled. Cannot carry on any self-care. Totally confined to bed or chair)  5 - Death   Eustace Pen MM, Creech RH, Tormey DC, et al. [phone]). "Toxicity and response criteria of the [facility]". [place] Oncol. 5 (6): 649-55    LABORATORY DATA:  Lab Results  Component Value Date   WBC 7.6 03/07/2020   HGB 13.5 03/07/2020   HCT 42.8 03/07/2020   MCV 100.7 (H) 03/07/2020   PLT 149 (L) 03/07/2020   Lab Results  Component Value Date   NA 134 (L) 03/07/2020   K 4.5 03/07/2020   CL 97 (L) 03/07/2020   CO2 29 03/07/2020   Lab Results  Component Value Date   ALT 29 03/07/2020   AST 22 03/07/2020   ALKPHOS 58 03/07/2020   BILITOT 0.8 03/07/2020      RADIOGRAPHY: MR Brain W Wo  Contrast  Result Date: 03/10/2020 CLINICAL DATA:  Follow-up scan after stereotactic radio surgical treatment metastatic lung carcinoma. EXAM: MRI HEAD WITHOUT AND WITH CONTRAST TECHNIQUE: Multiplanar, multiecho pulse sequences of the brain and surrounding structures were obtained without and with intravenous contrast. CONTRAST:  49mL MULTIHANCE GADOBENATE DIMEGLUMINE 529 MG/ML IV SOLN COMPARISON:  11/26/2019 FINDINGS: Brain: There is no acute hemorrhage. No midline shift or other mass effect. The lesion in the posterior left temporal lobe is decreased in size two 4 mm (series 15, image 52). The other previously seen enhancing lesions are no longer visible. There are no new lesions. Unchanged pattern of hyperintense T2-weighted signal within the white matter. Generalized volume loss is unchanged. Vascular: Normal flow voids. Skull and upper cervical spine: Normal marrow signal. Sinuses/Orbits: Negative. Other: None. IMPRESSION: Decreased size of the posterior left temporal lobe metastasis. The other previously seen enhancing lesions are no longer visible. No new lesions. Electronically Signed   By: Ulyses Jarred M.D.   On: 03/10/2020 22:29       IMPRESSION/PLAN: 1. Progressive Metastatic Limited Stage Small Cell Carcinoma of the right lung with brain disease. We reviewed his MRI findings and the reassuring features. He is doing well clinically with systemic immunotherapy and will continue with Dr. Delton Coombes. I reviewed the recommendations for continued surveillance with an MRI of  the brian in 3 months time. He's in agreement and will notify us of any questions or concerns prior to our next visit. In our discussion however travelling to [place] has become increasingly hard for him and he prefers to have scans at [facility]. He is aware of the fact that they don't have the ability to do 3T scans, but is in agreement that if there were concerns for new disease to develop he would try to arrange a way to get  to [facility] for a 3T scan. He understands the limitations of adequate brain surveillance with 1.5T MRIs, but I think given this request I told him it would be better to get scanned than to try to force the 3T scans where he would have delays in surveillance. He is in agreement with this.   Given current concerns for patient exposure during the COVID-19 pandemic, this encounter was conducted via telephone.  The patient has provided two factor identification and has given verbal consent for this type of encounter and has been advised to only accept a meeting of this type in a secure network environment. The time spent during this encounter was 30 minutes including preparation, discussion, and coordination of the patient's care. The attendants for this meeting include  Hayden Pedro  and Baker Janus.  During the encounter,  Hayden Pedro was located at [facility] Radiation Oncology Department.  LUMIR DEMETRIOU was located at home.      Carola Rhine, PAC

## 2020-03-14 NOTE — Telephone Encounter (Signed)
Called patient to review meaningful use questions for MRI results with Shona Simpson PA today @ 3:00pm. Was unable to leave voicemail message. TM

## 2020-03-14 NOTE — Telephone Encounter (Signed)
Spoke with patient in regards to telephone visit with Shona Simpson PA today @ 3:00pm. Patient verbalized understanding of appointment date and time. Reviewed meaningful use questions. TM

## 2020-03-28 ENCOUNTER — Inpatient Hospital Stay (HOSPITAL_COMMUNITY): Payer: Medicare Other | Attending: Hematology

## 2020-03-28 ENCOUNTER — Inpatient Hospital Stay (HOSPITAL_BASED_OUTPATIENT_CLINIC_OR_DEPARTMENT_OTHER): Payer: Medicare Other | Admitting: Hematology

## 2020-03-28 ENCOUNTER — Inpatient Hospital Stay (HOSPITAL_COMMUNITY): Payer: Medicare Other

## 2020-03-28 ENCOUNTER — Other Ambulatory Visit: Payer: Self-pay

## 2020-03-28 VITALS — BP 133/63 | HR 79 | Temp 97.2°F | Resp 18

## 2020-03-28 VITALS — BP 122/58 | HR 85 | Temp 97.0°F | Resp 17

## 2020-03-28 DIAGNOSIS — R200 Anesthesia of skin: Secondary | ICD-10-CM | POA: Diagnosis not present

## 2020-03-28 DIAGNOSIS — I2510 Atherosclerotic heart disease of native coronary artery without angina pectoris: Secondary | ICD-10-CM | POA: Insufficient documentation

## 2020-03-28 DIAGNOSIS — Z833 Family history of diabetes mellitus: Secondary | ICD-10-CM | POA: Diagnosis not present

## 2020-03-28 DIAGNOSIS — Z8249 Family history of ischemic heart disease and other diseases of the circulatory system: Secondary | ICD-10-CM | POA: Insufficient documentation

## 2020-03-28 DIAGNOSIS — Z9049 Acquired absence of other specified parts of digestive tract: Secondary | ICD-10-CM | POA: Insufficient documentation

## 2020-03-28 DIAGNOSIS — J441 Chronic obstructive pulmonary disease with (acute) exacerbation: Secondary | ICD-10-CM | POA: Diagnosis not present

## 2020-03-28 DIAGNOSIS — R0981 Nasal congestion: Secondary | ICD-10-CM | POA: Diagnosis not present

## 2020-03-28 DIAGNOSIS — Z79899 Other long term (current) drug therapy: Secondary | ICD-10-CM | POA: Diagnosis not present

## 2020-03-28 DIAGNOSIS — I509 Heart failure, unspecified: Secondary | ICD-10-CM | POA: Insufficient documentation

## 2020-03-28 DIAGNOSIS — M545 Low back pain, unspecified: Secondary | ICD-10-CM | POA: Insufficient documentation

## 2020-03-28 DIAGNOSIS — C3491 Malignant neoplasm of unspecified part of right bronchus or lung: Secondary | ICD-10-CM

## 2020-03-28 DIAGNOSIS — I4891 Unspecified atrial fibrillation: Secondary | ICD-10-CM | POA: Diagnosis not present

## 2020-03-28 DIAGNOSIS — M7989 Other specified soft tissue disorders: Secondary | ICD-10-CM | POA: Diagnosis not present

## 2020-03-28 DIAGNOSIS — R059 Cough, unspecified: Secondary | ICD-10-CM | POA: Insufficient documentation

## 2020-03-28 DIAGNOSIS — R5383 Other fatigue: Secondary | ICD-10-CM | POA: Insufficient documentation

## 2020-03-28 DIAGNOSIS — Z87891 Personal history of nicotine dependence: Secondary | ICD-10-CM | POA: Diagnosis not present

## 2020-03-28 DIAGNOSIS — R0602 Shortness of breath: Secondary | ICD-10-CM | POA: Insufficient documentation

## 2020-03-28 DIAGNOSIS — Z801 Family history of malignant neoplasm of trachea, bronchus and lung: Secondary | ICD-10-CM | POA: Insufficient documentation

## 2020-03-28 DIAGNOSIS — Z87442 Personal history of urinary calculi: Secondary | ICD-10-CM | POA: Diagnosis not present

## 2020-03-28 DIAGNOSIS — Z836 Family history of other diseases of the respiratory system: Secondary | ICD-10-CM | POA: Insufficient documentation

## 2020-03-28 DIAGNOSIS — Z803 Family history of malignant neoplasm of breast: Secondary | ICD-10-CM | POA: Diagnosis not present

## 2020-03-28 DIAGNOSIS — C3411 Malignant neoplasm of upper lobe, right bronchus or lung: Secondary | ICD-10-CM | POA: Diagnosis present

## 2020-03-28 DIAGNOSIS — Z7901 Long term (current) use of anticoagulants: Secondary | ICD-10-CM | POA: Diagnosis not present

## 2020-03-28 DIAGNOSIS — N189 Chronic kidney disease, unspecified: Secondary | ICD-10-CM | POA: Insufficient documentation

## 2020-03-28 DIAGNOSIS — C7931 Secondary malignant neoplasm of brain: Secondary | ICD-10-CM | POA: Insufficient documentation

## 2020-03-28 DIAGNOSIS — I429 Cardiomyopathy, unspecified: Secondary | ICD-10-CM | POA: Insufficient documentation

## 2020-03-28 DIAGNOSIS — Z5112 Encounter for antineoplastic immunotherapy: Secondary | ICD-10-CM | POA: Diagnosis present

## 2020-03-28 LAB — COMPREHENSIVE METABOLIC PANEL
ALT: 31 U/L (ref 0–44)
AST: 19 U/L (ref 15–41)
Albumin: 3.8 g/dL (ref 3.5–5.0)
Alkaline Phosphatase: 52 U/L (ref 38–126)
Anion gap: 8 (ref 5–15)
BUN: 20 mg/dL (ref 8–23)
CO2: 32 mmol/L (ref 22–32)
Calcium: 9.1 mg/dL (ref 8.9–10.3)
Chloride: 93 mmol/L — ABNORMAL LOW (ref 98–111)
Creatinine, Ser: 0.92 mg/dL (ref 0.61–1.24)
GFR, Estimated: >60 mL/min (ref >60)
Glucose, Bld: 258 mg/dL — ABNORMAL HIGH (ref 70–99)
Potassium: 4.6 mmol/L (ref 3.5–5.1)
Sodium: 133 mmol/L — ABNORMAL LOW (ref 135–145)
Total Bilirubin: 0.6 mg/dL (ref 0.3–1.2)
Total Protein: 6.7 g/dL (ref 6.5–8.1)

## 2020-03-28 LAB — CBC WITH DIFFERENTIAL/PLATELET
Abs Immature Granulocytes: 0.07 10*3/uL (ref 0.00–0.07)
Basophils Absolute: 0 10*3/uL (ref 0.0–0.1)
Basophils Relative: 0 %
Eosinophils Absolute: 0 10*3/uL (ref 0.0–0.5)
Eosinophils Relative: 0 %
HCT: 40.2 % (ref 39.0–52.0)
Hemoglobin: 12.4 g/dL — ABNORMAL LOW (ref 13.0–17.0)
Immature Granulocytes: 1 %
Lymphocytes Relative: 3 %
Lymphs Abs: 0.3 10*3/uL — ABNORMAL LOW (ref 0.7–4.0)
MCH: 31.9 pg (ref 26.0–34.0)
MCHC: 30.8 g/dL (ref 30.0–36.0)
MCV: 103.3 fL — ABNORMAL HIGH (ref 80.0–100.0)
Monocytes Absolute: 0.4 10*3/uL (ref 0.1–1.0)
Monocytes Relative: 5 %
Neutro Abs: 8.2 10*3/uL — ABNORMAL HIGH (ref 1.7–7.7)
Neutrophils Relative %: 91 %
Platelets: 142 10*3/uL — ABNORMAL LOW (ref 150–400)
RBC: 3.89 MIL/uL — ABNORMAL LOW (ref 4.22–5.81)
RDW: 14.3 % (ref 11.5–15.5)
WBC: 9 10*3/uL (ref 4.0–10.5)
nRBC: 0 % (ref 0.0–0.2)

## 2020-03-28 LAB — MAGNESIUM: Magnesium: 2 mg/dL (ref 1.7–2.4)

## 2020-03-28 LAB — TSH: TSH: 2.691 u[IU]/mL (ref 0.350–4.500)

## 2020-03-28 MED ORDER — SODIUM CHLORIDE 0.9 % IV SOLN
1200.0000 mg | Freq: Once | INTRAVENOUS | Status: AC
  Administered 2020-03-28: 1200 mg via INTRAVENOUS
  Filled 2020-03-28: qty 20

## 2020-03-28 MED ORDER — HEPARIN SOD (PORK) LOCK FLUSH 100 UNIT/ML IV SOLN: 500.0000 [IU] | Freq: Once | INTRAVENOUS | Status: DC | PRN

## 2020-03-28 MED ORDER — SODIUM CHLORIDE 0.9 % IV SOLN: Freq: Once | INTRAVENOUS | Status: AC

## 2020-03-28 NOTE — Patient Instructions (Signed)
[facility] Discharge Instructions for Patients Receiving Chemotherapy   Beginning January 23rd 2017 lab work for the [facility] will be done in the  Main lab at [facility] on 1st floor. If you have a lab appointment with the [facility] please come in thru the  Main Entrance and check in at the main information desk   Today you received the following chemotherapy agents Tecentriq  To help prevent nausea and vomiting after your treatment, we encourage you to take your nausea medication    If you develop nausea and vomiting, or diarrhea that is not controlled by your medication, call the clinic.  The clinic phone number is [phone]. Office hours are Monday-Friday 8:30am-5:00pm.  BELOW ARE SYMPTOMS THAT SHOULD BE REPORTED IMMEDIATELY:  *FEVER GREATER THAN 101.0 F  *CHILLS WITH OR WITHOUT FEVER  NAUSEA AND VOMITING THAT IS NOT CONTROLLED WITH YOUR NAUSEA MEDICATION  *UNUSUAL SHORTNESS OF BREATH  *UNUSUAL BRUISING OR BLEEDING  TENDERNESS IN MOUTH AND THROAT WITH OR WITHOUT PRESENCE OF ULCERS  *URINARY PROBLEMS  *BOWEL PROBLEMS  UNUSUAL RASH Items with * indicate a potential emergency and should be followed up as soon as possible. If you have an emergency after office hours please contact your primary care physician or go to the nearest emergency department.  Please call the clinic during office hours if you have any questions or concerns.   You may also contact the Patient Navigator at [phone] should you have any questions or need assistance in obtaining follow up care.      Resources For Cancer Patients and their Caregivers ? American Cancer Society: Can assist with transportation, wigs, general needs, runs Look Good Feel Better.        276-758-8265 ? Cancer Care: Provides financial assistance, online support groups, medication/co-pay assistance.  1-800-813-HOPE 2628336353) ? [place] Assists [place]  cancer patients and their families through emotional , educational and financial support.  (289) 778-8417 ? [place] DSS Where to apply for food stamps, Medicaid and utility assistance. 802-316-2025 ? [facility]: Transportation to medical appointments. 207-864-1092 ? Social Security Administration: May apply for disability if have a Stage IV cancer. 416-108-7864 313-546-9160 ? [employer], Disability and Transit Services: Assists with nutrition, care and transit needs. [phone]

## 2020-03-28 NOTE — Progress Notes (Signed)
Chase Herrera presents today for [phone] Tecentriq. Pt denies any new changes or symptoms since last treatment. Lab results and vitals have been reviewed and are stable and within parameters for treatment. Patient has been assessed by Dr. Delton Coombes who has approved proceeding with treatment today as planned.  Infusions tolerated without incident or complaint. VSS upon completion of treatment. Port flushed and deaccessed per protocol, see MAR and IV flowsheet for details. Discharged in satisfactory condition with follow up instructions.

## 2020-03-28 NOTE — Progress Notes (Signed)
Patient was assessed by Dr. Katragadda and labs have been reviewed.  Patient is okay to proceed with treatment today. Primary RN and pharmacy aware.   

## 2020-03-28 NOTE — Progress Notes (Signed)
[place], [place] [address]   CLINIC:  Medical Oncology/Hematology  PCP:  Leonie Douglas, MD [name] [place] [address] [phone]   REASON FOR VISIT:  Follow-up for right small cell lung cancer  PRIOR THERAPY: Carboplatin, VP-16 and atezolizumab x 3 cycles from 05/06/2019 to 08/28/2019  NGS Results: Not done  CURRENT THERAPY: Atezolizumab every 3 weeks  BRIEF ONCOLOGIC HISTORY:  Oncology History  Small cell lung cancer ([place])  04/03/2016 Initial Diagnosis   Small cell lung cancer ([place])   04/09/2016 Imaging   MRI brain- Negative for metastatic disease to the brain. No acute abnormality.   04/09/2016 Procedure   Right IJ port catheter placement by IR   04/13/2016 PET scan   . The right apical lung mass has significantly enlarged and there is new right paratracheal adenopathy as well as a hypermetabolic new lymph node in the right inner infraclavicular fossa potentially with early impingement on adjacent neurovascular structures. 2. Other imaging findings of potential clinical significance: Chronic bilateral maxillary sinusitis. Left carotid atherosclerotic calcification. Aortoiliac atherosclerotic vascular disease. Coronary atherosclerosis. Paraseptal emphysema. Mild atelectasis in the lung bases. Scattered sigmoid colon diverticula. Probable right pars defect at L5.   05/09/2016 - 07/12/2016 Radiation Therapy   Radiotherapy to right Lung+MS 63 Gy/35 fractions at 1.8 GY/f using 6 x photons 3- D CRT technique Dates: 4/18-6/21/18       06/19/2016 Treatment Plan Change   Cisplatin dose reduced by 20% for cycle #4 due to severe thrombocytopenia following cycle #3 (13,000).   08/21/2016 PET scan   1. Marked reduction in size and metabolic activity of RIGHT upper lobe mass. 2. Resolution of mediastinal and RIGHT supraclavicular lymphadenopathy. 3. No evidence of disease progression.   05/06/2019 -  Chemotherapy     Patient is on Treatment Plan: LUNG SCLC  ATEZOLIZUMAB MAINTENANCE Q21D        CANCER STAGING: Cancer Staging Small cell lung cancer ([place]) Staging form: Lung, AJCC 8th Edition - Clinical: Stage IIIC (cT3, cN3, cM0) - Signed by Twana First, MD on 04/16/2016   INTERVAL HISTORY:  Mr. SHANNEN VERNON, a 70 y.o. male, returns for routine follow-up and consideration for next cycle of immunotherapy. Anton was last seen on 03/07/2020.  Due for cycle #13 of atezolizumab today.   Overall, he tells me he has been feeling pretty well. He continues taking Eliquis and complains of having dark black stools on 03/04. He also reports having nasal congestion. He took his last dose of prednisone 60 mg today for his COPD exacerbation and his SOB has improved, except for when he gets up to walk. He denies having any diarrhea or constipation.  Overall, he feels ready for next cycle of immunotherapy today.    REVIEW OF SYSTEMS:  Review of Systems  Constitutional: Positive for fatigue (50%). Negative for appetite change.  Respiratory: Positive for cough and shortness of breath (w/ exertion).   Cardiovascular: Positive for leg swelling.  Gastrointestinal: Negative for constipation and diarrhea.       Melena on 03/04  Neurological: Positive for numbness (hands & feet).  All other systems reviewed and are negative.   PAST MEDICAL/SURGICAL HISTORY:  Past Medical History:  Diagnosis Date  . Alcohol abuse    Quit 08/23/11  . Arthritis   . Atrial fibrillation ([place])   . Atrial flutter ([place])    CTI ablation by Dr [name] 10/2011  . Bacteremia due to Gram-positive bacteria 11/29/2016  .  CHF (congestive heart failure) ([place])   . COPD (chronic obstructive pulmonary disease) ([place])   . Depression   . Essential hypertension   . History of cardiomyopathy    LVEF 25-30% 08/2011 with subsequent normalization  . History of kidney stones   . Obesity   . Prostate enlargement   . Small cell lung cancer ([place])     Right upper lobe - follows with [facility]  . Type 2 diabetes mellitus ([place])    Past Surgical History:  Procedure Laterality Date  . APPENDECTOMY    . ATRIAL ABLATION SURGERY  11/06/2011   CTI ablation for atrial flutter by Dr [name]  . ATRIAL FLUTTER ABLATION N/A 11/06/2011   Procedure: ATRIAL FLUTTER ABLATION;  Surgeon: Thompson Grayer, MD;  Location: [facility] CATH LAB;  Service: Cardiovascular;  Laterality: N/A;  . Cataracts    . COLONOSCOPY WITH PROPOFOL N/A 06/06/2017   Procedure: COLONOSCOPY WITH PROPOFOL;  Surgeon: [name], MD;  Location: [facility] ENDO SUITE;  Service: Endoscopy;  Laterality: N/A;  1:15pm  . IR GENERIC HISTORICAL  04/09/2016   IR US GUIDE VASC ACCESS RIGHT 04/09/2016 [name], DO [facility]-INTERV RAD  . IR GENERIC HISTORICAL  04/09/2016   IR FLUORO GUIDE PORT INSERTION RIGHT 04/09/2016 [name], DO [facility]-INTERV RAD  . PORT-A-CATH REMOVAL N/A 04/24/2017   Procedure: MINOR REMOVAL PORT-A-CATH;  Surgeon: [name], MD;  Location: [facility] ORS;  Service: General;  Laterality: N/A;  . PORTACATH PLACEMENT Left 04/22/2019   Procedure: INSERTION PORT-A-CATH;  Surgeon: Aviva Signs, MD;  Location: [facility] ORS;  Service: General;  Laterality: Left;  . TONSILLECTOMY      SOCIAL HISTORY:  Social History   Socioeconomic History  . Marital status: Single    Spouse name: Not on file  . Number of children: Not on file  . Years of education: Not on file  . Highest education level: Not on file  Occupational History  . Occupation: retired     Comment: Building services engineer  Tobacco Use  . Smoking status: Former Smoker    Packs/day: 0.50    Years: 47.00    Pack years: 23.50    Types: Cigarettes    Quit date: 05/30/2012    Years since quitting: 7.8  . Smokeless tobacco: Former Systems developer    Quit date: 04/10/2014  . Tobacco comment: encouraged to quit today 03/04/12  Vaping Use  . Vaping Use: Never used  Substance and Sexual Activity  . Alcohol use: No    Comment: quit 3 1/2 years ago  .  Drug use: Not Currently    Comment: marajuana occasionally  . Sexual activity: Not Currently  Other Topics Concern  . Not on file  Social History Narrative   Lives in [place] alone.   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 Difficulty of Paying Living Expenses: Somewhat hard  Food Insecurity: No Food Insecurity  . Worried About Charity fundraiser in the Last Year: Never true  . Ran Out of Food in the Last Year: Never true  Transportation Needs: No Transportation Needs  . Lack of Transportation (Medical): No  . Lack of Transportation (Non-Medical): No  Physical Activity: Inactive  . Days of Exercise per Week: 0 days  . Minutes of Exercise per Session: 0 min  Stress: No Stress Concern Present  . Feeling of Stress : Not at all  Social Connections: Moderately Isolated  . Frequency of Communication with Friends and Family: More than three times a week  . Frequency of Social Gatherings with Friends and Family: Never  . Attends Religious Services: More than 4 times per year  . Active Member of Clubs or Organizations: No  . Attends Archivist Meetings: Never  . Marital Status: Never married  Intimate Partner Violence: Not At Risk  . Fear of Current or Ex-Partner: No  . Emotionally Abused: No  . Physically Abused: No  . Sexually Abused: No    FAMILY HISTORY:  Family History  Problem Relation Age of Onset  . Diabetes Sister   . Hypertension Mother   . Lung cancer Mother   . Hypertension Father   . Heart attack Father   . Hypertension Sister   . Hypertension Sister   . Hypertension Sister   . Pulmonary fibrosis Sister   . Breast cancer Cousin   . Colon cancer Neg Hx     CURRENT MEDICATIONS:  Current Outpatient Medications  Medication Sig Dispense Refill  . acetaminophen (TYLENOL) 500 MG tablet Take 1,000 mg by mouth every 6 (six) hours as needed for  moderate pain or headache.     Marland Kitchen apixaban (ELIQUIS) 5 MG TABS tablet Take 5 mg by mouth 2 (two) times daily.     Marland Kitchen AQUALANCE LANCETS 30G MISC USE TO check blood glucose twice daily  99  . Artificial Saliva (BIOTENE DRY MOUTH MOISTURIZING) SOLN Apply 1 Dose topically as needed (dry mouth).     Marland Kitchen atorvastatin (LIPITOR) 20 MG tablet Take 1 tablet (20 mg total) by mouth daily. 90 tablet 3  . benzonatate (TESSALON) 100 MG capsule Take 2 capsules (200 mg total) by mouth every 8 (eight) hours as needed. 30 capsule 1  . Carboxymethylcellul-Glycerin (LUBRICATING EYE DROPS OP) Place 1 drop into the left eye daily as needed (dry eyes).     Marland Kitchen Co-Enzyme Q10 200 MG CAPS See admin instructions.    Marland Kitchen diltiazem (DILACOR XR) 240 MG 24 hr capsule Take 240 mg by mouth daily.    Marland Kitchen dofetilide (TIKOSYN) 250 MCG capsule Take 1 capsule (250 mcg total) by mouth 2 (two) times daily.    . fluticasone (FLONASE) 50 MCG/ACT nasal spray Place 2 sprays into both nostrils daily. 16 g 6  . Fluticasone-Umeclidin-Vilant (TRELEGY ELLIPTA) 100-62.5-25 MCG/INH AEPB Inhale 1 puff into the lungs daily. 60 each 6  . furosemide (LASIX) 80 MG tablet Take 80 mg by mouth daily.    Marland Kitchen gabapentin (NEURONTIN) 300 MG capsule Take 600 mg by mouth 2 (two) times daily.     Marland Kitchen GLOBAL EASE INJECT PEN NEEDLES 31G X 8 MM MISC Inject into the skin 3 (three) times daily.    Marland Kitchen guaifenesin (ROBITUSSIN) 100 MG/5ML syrup Take 200 mg by mouth at bedtime as needed for cough.     Marland Kitchen HM SENNA 8.6 MG tablet Take 2 tablets by mouth at bedtime.    Marland Kitchen HYDROcodone-acetaminophen (NORCO/VICODIN) 5-325 MG tablet Take 1 tablet by mouth at bedtime as needed. 30 tablet 0  . insulin lispro (HUMALOG) 100 UNIT/ML injection Inject 2-20 Units into the skin 3 (three) times daily with meals. Sliding scale    . Insulin Syringe-Needle U-100 28G X 1/2" 1 ML MISC 1 each by Does not apply route 3 (three) times daily. 100 each 3  . ipratropium-albuterol (DUONEB) 0.5-2.5 (3) MG/3ML SOLN Take  3 mLs by nebulization every 4 (four) hours as needed (shortness of  breath).     Elmore Guise Devices (ADJUSTABLE LANCING DEVICE) MISC TO check blood glucose daily    . LANTUS SOLOSTAR 100 UNIT/ML Solostar Pen INJECT 20 UNITS UNDER THE SKIN EVERY MORNING AND INJECT 20 UNITS UNDER THE SKIN EVERY EVENING (Patient taking differently: Inject 20 Units into the skin 2 (two) times daily.) 45 mL 0  . levalbuterol (XOPENEX) 0.63 MG/3ML nebulizer solution Inhale 3 mLs (0.63 mg total) into the lungs 4 (four) times daily. 3 mL 0  . Lidocaine (SALONPAS PAIN RELIEVING EX) Apply 1 patch topically daily.     Marland Kitchen lidocaine-prilocaine (EMLA) cream Apply a small amount to port a cath site and cover with plastic wrap 1 hour prior to chemotherapy appointments 30 g 3  . metFORMIN (GLUCOPHAGE) 500 MG tablet Take 500 mg by mouth in the morning and at bedtime.    . metoprolol succinate (TOPROL XL) 25 MG 24 hr tablet Take 1 tablet (25 mg total) by mouth daily. (Patient taking differently: Take 25 mg by mouth in the morning and at bedtime.) 90 tablet 1  . Misc. Devices MISC Please provide patient with an electric wheelchair. 1 each 0  . Multiple Vitamin (MULTIVITAMIN WITH MINERALS) TABS tablet Take 1 tablet by mouth daily.    . potassium chloride (KLOR-CON) 10 MEQ tablet Take 10 mEq by mouth 2 (two) times daily.    . predniSONE (DELTASONE) 20 MG tablet Take 60 mg by mouth daily.    . pregabalin (LYRICA) 150 MG capsule Take 150 mg by mouth in the morning and at bedtime.    Marland Kitchen PROAIR HFA 108 (90 Base) MCG/ACT inhaler INHALE TWO PUFFS BY MOUTH EVERY 4 HOURS AS NEEDED FOR WHEEZING 17 g 0  . spironolactone (ALDACTONE) 25 MG tablet Take 12.5 mg by mouth 2 (two) times daily.     . tamsulosin (FLOMAX) 0.4 MG CAPS capsule Take 1 capsule (0.4 mg total) by mouth 2 (two) times daily after a meal. 180 capsule 1  . traZODone (DESYREL) 50 MG tablet Take 50 mg by mouth at bedtime.  5   No current facility-administered medications for this visit.     ALLERGIES:  No Known Allergies  PHYSICAL EXAM:  Performance status (ECOG): 1 - Symptomatic but completely ambulatory  Vitals:   03/28/20 0932  BP: (!) 122/58  Pulse: 85  Resp: 17  Temp: (!) 97 F (36.1 C)  SpO2: 93%   Wt Readings from Last 3 Encounters:  03/07/20 250 lb (113.4 kg)  02/19/20 240 lb (108.9 kg)  01/07/20 244 lb (110.7 kg)   Physical Exam Vitals reviewed.  Constitutional:      Appearance: Normal appearance. He is obese.     Interventions: Nasal cannula in place.  Cardiovascular:     Rate and Rhythm: Normal rate and regular rhythm.     Pulses: Normal pulses.     Heart sounds: Normal heart sounds.  Pulmonary:     Effort: Pulmonary effort is normal.     Breath sounds: Normal breath sounds.  Chest:     Comments: Port-a-Cath in L chest Neurological:     General: No focal deficit present.     Mental Status: He is alert and oriented to person, place, and time.  Psychiatric:        Mood and Affect: Mood normal.        Behavior: Behavior normal.     LABORATORY DATA:  I have reviewed the labs as listed.  CBC Latest Ref Rng & Units 03/07/2020 01/05/2020  12/15/2019  WBC 4.0 - 10.5 K/uL 7.6 6.7 7.1  Hemoglobin 13.0 - 17.0 g/dL 13.5 12.3(L) 13.5  Hematocrit 39.0 - 52.0 % 42.8 38.6(L) 43.9  Platelets 150 - 400 K/uL 149(L) 141(L) 120(L)   CMP Latest Ref Rng & Units 03/28/2020 03/07/2020 02/05/2020  Glucose 70 - 99 mg/dL 258(H) 208(H) -  BUN 8 - 23 mg/dL 20 17 -  Creatinine 0.61 - 1.24 mg/dL 0.92 1.00 0.90  Sodium 135 - 145 mmol/L 133(L) 134(L) -  Potassium 3.5 - 5.1 mmol/L 4.6 4.5 -  Chloride 98 - 111 mmol/L 93(L) 97(L) -  CO2 22 - 32 mmol/L 32 29 -  Calcium 8.9 - 10.3 mg/dL 9.1 9.0 -  Total Protein 6.5 - 8.1 g/dL 6.7 6.5 -  Total Bilirubin 0.3 - 1.2 mg/dL 0.6 0.8 -  Alkaline Phos 38 - 126 U/L 52 58 -  AST 15 - 41 U/L 19 22 -  ALT 0 - 44 U/L 31 29 -    DIAGNOSTIC IMAGING:  I have independently reviewed the scans and discussed with the patient. MR  Brain W Wo Contrast  Result Date: 03/10/2020 CLINICAL DATA:  Follow-up scan after stereotactic radio surgical treatment metastatic lung carcinoma. EXAM: MRI HEAD WITHOUT AND WITH CONTRAST TECHNIQUE: Multiplanar, multiecho pulse sequences of the brain and surrounding structures were obtained without and with intravenous contrast. CONTRAST:  36mL MULTIHANCE GADOBENATE DIMEGLUMINE 529 MG/ML IV SOLN COMPARISON:  11/26/2019 FINDINGS: Brain: There is no acute hemorrhage. No midline shift or other mass effect. The lesion in the posterior left temporal lobe is decreased in size two 4 mm (series 15, image 52). The other previously seen enhancing lesions are no longer visible. There are no new lesions. Unchanged pattern of hyperintense T2-weighted signal within the white matter. Generalized volume loss is unchanged. Vascular: Normal flow voids. Skull and upper cervical spine: Normal marrow signal. Sinuses/Orbits: Negative. Other: None. IMPRESSION: Decreased size of the posterior left temporal lobe metastasis. The other previously seen enhancing lesions are no longer visible. No new lesions. Electronically Signed   By: Ulyses Jarred M.D.   On: 03/10/2020 22:29     ASSESSMENT:  1. Recurrent small cell lung cancer: -Treated for limited stage small cell lung cancer with chemoradiation with cisplatin and etoposide therapy on 06/21/2016 followed by PCI. -CT scan on 04/17/2019 showed worsening right lung nodules. Left lower lobe infectious cavitary lesion was decreased in size. New right middle lobe lung nodule present. -6 cycles of carboplatin, VP-16 and atezolizumab from 05/06/2019 through 08/26/2019. -CT CAP on 07/07/2019 showed improvement in the right lung cancer. Interval development of consolidative change in the left upper lobe consistent with infection/inflammation. -CT CAP on 10/21/2019 showed stable posttreatment changes in the right lung apex and suprahilar medial right upper lobe. Nodular opacity in the left  apex has decreased in size. 5 mm posterior left lower lobe nodule is new, likely infectious/inflammatory. Left lower lobe nodules are stable. -CT CAP on 02/05/2020 shows enlarging right paracolic lymph node adjacent to the ascending colon measuring 1.5 cm, previously 0.5 cm, reactive versus neoplastic.  Stable right apical and paramediastinal/perihilar consolidation.  2. Brain metastasis: -MRI of the brain on 05/13/2019 showed small 7 mm ring enhancing lesion at the junction of the left posterior temporal and anterior occipital lobes compatible with solitary metastasis. Typical appearance of lacunar infarct is also considered. -MRI of the brain on 07/31/2019 shows stable 7 mm solitary metastasis in the left occipital temporal junction. -MRI of the brain on 10/28/2019 showed mild  increase in size of rim-enhancing lesion measuring 10 x 10 mm. No new lesions identified.   PLAN:  1. Recurrent small cell lung cancer: -He went to ER at [facility] and has completed prednisone today. -He reported one black stool about 3 days ago. -CBC showed his hemoglobin decreased by one-point to 12.4 from 13.5. -LFTs are within normal limits.  Electrolytes are normal.  Platelet count is 142.  TSH was 2.6. -Proceed with Atezolizumab today.  Return to clinic in 3 weeks.  We will repeat CT CAP in mid April.  2. CKD: -Creatinine is 0.92.  This is stable.  3. Diabetes: -Continue Lantus twice daily and NovoLog sliding scale.  4. Atrial fibrillation: -Continue Eliquis 5 mg twice daily.  No bleeding issues.  5. Low back pain: -Continue hydrocodone 5/325 mg at bedtime as needed.  6. Brain metastasis: -He had previous SRS.  He cannot do 3T MRI because of transportation issues. -1.5T MRI on 03/10/2020 shows decrease in size of the posterior left temporal lobe metastasis.  Other previously seen enhancing lesions are no longer visible.  7. Lower extremity swelling: -Continue Lasix and spironolactone.    Orders placed this encounter:  No orders of the defined types were placed in this encounter.    Derek Jack, MD [place] [phone]   I, Milinda Antis, am acting as a scribe for Dr. Sanda Linger.  I, Derek Jack MD, have reviewed the above documentation for accuracy and completeness, and I agree with the above.

## 2020-03-28 NOTE — Patient Instructions (Signed)
[facility] at [facility] Discharge Instructions  You were seen today by Dr. Katragadda. He went over your recent results. You received your treatment today. Dr. Katragadda will see you back in 3 weeks for labs and follow up.   Thank you for choosing [facility] at [facility] to provide your oncology and hematology care.  To afford each patient quality time with our provider, please arrive at least 15 minutes before your scheduled appointment time.   If you have a lab appointment with the Cancer Center please come in thru the Main Entrance and check in at the main information desk  You need to re-schedule your appointment should you arrive 10 or more minutes late.  We strive to give you quality time with our providers, and arriving late affects you and other patients whose appointments are after yours.  Also, if you no show three or more times for appointments you may be dismissed from the clinic at the providers discretion.     Again, thank you for choosing [facility].  Our hope is that these requests will decrease the amount of time that you wait before being seen by our physicians.       _____________________________________________________________  Should you have questions after your visit to [facility], please contact our office at [phone] between the hours of 8:00 a.m. and 4:30 p.m.  Voicemails left after 4:00 p.m. will not be returned until the following business day.  For prescription refill requests, have your pharmacy contact our office and allow 72 hours.    Cancer Center Support Programs:   > Cancer Support Group  2nd Tuesday of the month 1pm-2pm, Journey Room    

## 2020-03-29 ENCOUNTER — Other Ambulatory Visit (HOSPITAL_COMMUNITY): Payer: Self-pay

## 2020-03-29 ENCOUNTER — Encounter: Payer: Self-pay | Admitting: Internal Medicine

## 2020-03-29 ENCOUNTER — Ambulatory Visit: Payer: Medicare Other | Admitting: Internal Medicine

## 2020-03-29 DIAGNOSIS — C3491 Malignant neoplasm of unspecified part of right bronchus or lung: Secondary | ICD-10-CM

## 2020-04-05 ENCOUNTER — Encounter (HOSPITAL_COMMUNITY): Payer: Self-pay

## 2020-04-05 NOTE — Progress Notes (Signed)
Call received from [facility] that they have received a self referral for Hospice. I have reached out to the patient and caregiver/POA, Joelene Millin who was with him about what has prompted the patient to transition to hospice care and stopping oncology treatment at this time. Patient reports being "tired" and ready for this transition. Patient also reports that he is unable to stand for any transfers at all at this point and continues to have increased weakness, cough, changes with his BMs and just overall "tired" feeling. Patient wishes to cancel all future appts with Dr. Delton Coombes and proceed with [facility]. Dr. Delton Coombes aware of patient's desires. Cassandra at [facility] also aware of conversation and will reach out to the patient to set up initial home visit as soon as possible per the patient and caregiver request.

## 2020-04-13 ENCOUNTER — Encounter: Payer: Self-pay | Admitting: Radiation Therapy

## 2020-04-18 ENCOUNTER — Ambulatory Visit (HOSPITAL_COMMUNITY): Payer: Medicare Other | Admitting: Hematology

## 2020-04-18 ENCOUNTER — Other Ambulatory Visit (HOSPITAL_COMMUNITY): Payer: Medicare Other

## 2020-04-18 ENCOUNTER — Ambulatory Visit (HOSPITAL_COMMUNITY): Payer: Medicare Other

## 2020-04-22 DEATH — deceased

## 2020-06-29 ENCOUNTER — Ambulatory Visit: Payer: Medicare Other | Admitting: Cardiology

## 2020-08-18 ENCOUNTER — Ambulatory Visit: Payer: Medicare Other | Admitting: Cardiology
